# Patient Record
Sex: Female | Born: 1950 | Race: White | Hispanic: No | Marital: Married | Smoking: Never smoker
Health system: Southern US, Community
[De-identification: ages and names within clinical notes are randomized; demographics above are authoritative.]

## PROBLEM LIST (undated history)

## (undated) DIAGNOSIS — I82409 Acute embolism and thrombosis of unspecified deep veins of unspecified lower extremity: Secondary | ICD-10-CM

## (undated) DIAGNOSIS — I509 Heart failure, unspecified: Secondary | ICD-10-CM

## (undated) DIAGNOSIS — R0602 Shortness of breath: Secondary | ICD-10-CM

## (undated) DIAGNOSIS — K219 Gastro-esophageal reflux disease without esophagitis: Secondary | ICD-10-CM

## (undated) DIAGNOSIS — I519 Heart disease, unspecified: Secondary | ICD-10-CM

## (undated) DIAGNOSIS — Z8619 Personal history of other infectious and parasitic diseases: Secondary | ICD-10-CM

## (undated) DIAGNOSIS — I4891 Unspecified atrial fibrillation: Secondary | ICD-10-CM

## (undated) DIAGNOSIS — I447 Left bundle-branch block, unspecified: Secondary | ICD-10-CM

## (undated) DIAGNOSIS — K449 Diaphragmatic hernia without obstruction or gangrene: Secondary | ICD-10-CM

## (undated) DIAGNOSIS — F32A Depression, unspecified: Secondary | ICD-10-CM

## (undated) DIAGNOSIS — E669 Obesity, unspecified: Secondary | ICD-10-CM

## (undated) DIAGNOSIS — E785 Hyperlipidemia, unspecified: Secondary | ICD-10-CM

## (undated) DIAGNOSIS — C50919 Malignant neoplasm of unspecified site of unspecified female breast: Secondary | ICD-10-CM

## (undated) DIAGNOSIS — F329 Major depressive disorder, single episode, unspecified: Secondary | ICD-10-CM

## (undated) DIAGNOSIS — I10 Essential (primary) hypertension: Secondary | ICD-10-CM

## (undated) DIAGNOSIS — Z95810 Presence of automatic (implantable) cardiac defibrillator: Secondary | ICD-10-CM

## (undated) DIAGNOSIS — M1990 Unspecified osteoarthritis, unspecified site: Secondary | ICD-10-CM

## (undated) DIAGNOSIS — I428 Other cardiomyopathies: Secondary | ICD-10-CM

## (undated) DIAGNOSIS — I7100 Dissection of unspecified site of aorta: Secondary | ICD-10-CM

## (undated) HISTORY — PX: BREAST LUMPECTOMY: SHX2

## (undated) HISTORY — PX: TUBAL LIGATION: SHX77

## (undated) HISTORY — DX: Essential (primary) hypertension: I10

## (undated) HISTORY — DX: Malignant neoplasm of unspecified site of unspecified female breast: C50.919

## (undated) HISTORY — DX: Unspecified atrial fibrillation: I48.91

## (undated) HISTORY — DX: Left bundle-branch block, unspecified: I44.7

## (undated) HISTORY — DX: Major depressive disorder, single episode, unspecified: F32.9

## (undated) HISTORY — PX: CARDIAC CATHETERIZATION: SHX172

## (undated) HISTORY — DX: Acute embolism and thrombosis of unspecified deep veins of unspecified lower extremity: I82.409

## (undated) HISTORY — PX: CARDIAC DEFIBRILLATOR PLACEMENT: SHX171

## (undated) HISTORY — DX: Obesity, unspecified: E66.9

## (undated) HISTORY — DX: Hyperlipidemia, unspecified: E78.5

## (undated) HISTORY — DX: Heart disease, unspecified: I51.9

## (undated) HISTORY — PX: TONSILLECTOMY: SUR1361

## (undated) HISTORY — PX: OOPHORECTOMY: SHX86

## (undated) HISTORY — DX: Depression, unspecified: F32.A

## (undated) HISTORY — DX: Presence of automatic (implantable) cardiac defibrillator: Z95.810

## (undated) HISTORY — DX: Dissection of unspecified site of aorta: I71.00

## (undated) HISTORY — DX: Personal history of other infectious and parasitic diseases: Z86.19

## (undated) HISTORY — DX: Other cardiomyopathies: I42.8

---

## 2008-12-01 DIAGNOSIS — I7100 Dissection of unspecified site of aorta: Secondary | ICD-10-CM

## 2008-12-01 HISTORY — DX: Dissection of unspecified site of aorta: I71.00

## 2008-12-02 ENCOUNTER — Ambulatory Visit: Payer: Self-pay | Admitting: Pulmonary Disease

## 2008-12-02 ENCOUNTER — Inpatient Hospital Stay (HOSPITAL_COMMUNITY): Admission: AD | Admit: 2008-12-02 | Discharge: 2008-12-09 | Payer: Self-pay | Admitting: Pulmonary Disease

## 2008-12-02 ENCOUNTER — Ambulatory Visit: Payer: Self-pay | Admitting: Cardiovascular Disease

## 2008-12-02 ENCOUNTER — Ambulatory Visit: Payer: Self-pay | Admitting: Cardiothoracic Surgery

## 2008-12-03 ENCOUNTER — Encounter: Payer: Self-pay | Admitting: Pulmonary Disease

## 2008-12-24 ENCOUNTER — Encounter: Admission: RE | Admit: 2008-12-24 | Discharge: 2008-12-24 | Payer: Self-pay | Admitting: Cardiothoracic Surgery

## 2008-12-24 ENCOUNTER — Ambulatory Visit: Payer: Self-pay | Admitting: Cardiothoracic Surgery

## 2009-02-27 ENCOUNTER — Encounter: Admission: RE | Admit: 2009-02-27 | Discharge: 2009-02-27 | Payer: Self-pay | Admitting: Cardiothoracic Surgery

## 2009-02-27 ENCOUNTER — Ambulatory Visit: Payer: Self-pay | Admitting: Cardiothoracic Surgery

## 2009-07-01 DIAGNOSIS — C50919 Malignant neoplasm of unspecified site of unspecified female breast: Secondary | ICD-10-CM

## 2009-07-01 HISTORY — DX: Malignant neoplasm of unspecified site of unspecified female breast: C50.919

## 2009-08-28 ENCOUNTER — Ambulatory Visit: Payer: Self-pay | Admitting: Cardiothoracic Surgery

## 2009-08-28 ENCOUNTER — Encounter: Admission: RE | Admit: 2009-08-28 | Discharge: 2009-08-28 | Payer: Self-pay | Admitting: Cardiothoracic Surgery

## 2010-03-10 ENCOUNTER — Ambulatory Visit: Payer: Self-pay | Admitting: Cardiothoracic Surgery

## 2010-03-10 ENCOUNTER — Encounter: Admission: RE | Admit: 2010-03-10 | Discharge: 2010-03-10 | Payer: Self-pay | Admitting: Cardiothoracic Surgery

## 2010-08-17 DIAGNOSIS — H409 Unspecified glaucoma: Secondary | ICD-10-CM | POA: Insufficient documentation

## 2010-08-17 DIAGNOSIS — F3289 Other specified depressive episodes: Secondary | ICD-10-CM | POA: Insufficient documentation

## 2010-08-17 DIAGNOSIS — I7100 Dissection of unspecified site of aorta: Secondary | ICD-10-CM

## 2010-08-17 DIAGNOSIS — Z87898 Personal history of other specified conditions: Secondary | ICD-10-CM

## 2010-08-17 DIAGNOSIS — I10 Essential (primary) hypertension: Secondary | ICD-10-CM | POA: Insufficient documentation

## 2010-08-17 DIAGNOSIS — G8929 Other chronic pain: Secondary | ICD-10-CM

## 2010-08-17 DIAGNOSIS — F329 Major depressive disorder, single episode, unspecified: Secondary | ICD-10-CM

## 2010-08-18 ENCOUNTER — Ambulatory Visit: Payer: Self-pay | Admitting: Cardiovascular Disease

## 2010-08-18 DIAGNOSIS — I447 Left bundle-branch block, unspecified: Secondary | ICD-10-CM | POA: Insufficient documentation

## 2010-10-04 ENCOUNTER — Inpatient Hospital Stay (HOSPITAL_COMMUNITY)
Admission: EM | Admit: 2010-10-04 | Discharge: 2010-10-08 | Payer: Self-pay | Source: Home / Self Care | Attending: Internal Medicine | Admitting: Internal Medicine

## 2010-10-05 ENCOUNTER — Encounter: Payer: Self-pay | Admitting: Internal Medicine

## 2010-10-12 ENCOUNTER — Telehealth: Payer: Self-pay | Admitting: Cardiovascular Disease

## 2010-10-18 DIAGNOSIS — I82409 Acute embolism and thrombosis of unspecified deep veins of unspecified lower extremity: Secondary | ICD-10-CM

## 2010-10-18 HISTORY — DX: Acute embolism and thrombosis of unspecified deep veins of unspecified lower extremity: I82.409

## 2010-10-19 ENCOUNTER — Ambulatory Visit: Payer: Self-pay | Admitting: Cardiovascular Disease

## 2010-10-19 DIAGNOSIS — I82409 Acute embolism and thrombosis of unspecified deep veins of unspecified lower extremity: Secondary | ICD-10-CM | POA: Insufficient documentation

## 2010-10-19 DIAGNOSIS — R062 Wheezing: Secondary | ICD-10-CM

## 2010-10-19 DIAGNOSIS — I429 Cardiomyopathy, unspecified: Secondary | ICD-10-CM

## 2010-10-28 ENCOUNTER — Ambulatory Visit: Payer: Self-pay | Admitting: Pulmonary Disease

## 2010-10-28 DIAGNOSIS — G4733 Obstructive sleep apnea (adult) (pediatric): Secondary | ICD-10-CM | POA: Insufficient documentation

## 2010-11-02 ENCOUNTER — Encounter: Payer: Self-pay | Admitting: Cardiovascular Disease

## 2010-11-10 ENCOUNTER — Telehealth: Payer: Self-pay | Admitting: Cardiovascular Disease

## 2010-11-10 ENCOUNTER — Encounter: Payer: Self-pay | Admitting: Pulmonary Disease

## 2010-11-10 ENCOUNTER — Ambulatory Visit (HOSPITAL_BASED_OUTPATIENT_CLINIC_OR_DEPARTMENT_OTHER)
Admission: RE | Admit: 2010-11-10 | Discharge: 2010-11-10 | Payer: Self-pay | Source: Home / Self Care | Attending: Pulmonary Disease | Admitting: Pulmonary Disease

## 2010-11-24 ENCOUNTER — Telehealth (INDEPENDENT_AMBULATORY_CARE_PROVIDER_SITE_OTHER): Payer: Self-pay | Admitting: *Deleted

## 2010-11-26 ENCOUNTER — Ambulatory Visit
Admission: RE | Admit: 2010-11-26 | Discharge: 2010-11-26 | Payer: Self-pay | Source: Home / Self Care | Attending: Pulmonary Disease | Admitting: Pulmonary Disease

## 2010-11-30 NOTE — Assessment & Plan Note (Signed)
Summary: pt wants cardiac eval/mt   Visit Type:  new pt visit Primary Provider:  [name]  CC:  establish cardiology care. 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 She tells me that she has been feeling well. I saw her as a consult at [facility] in February 2010 when she was admitted with abdominal pain. She was found to have LBBB and a Type B aortic dissection which was managed conservatively at the time. She has had no other cardiac issues. Her aortic dissection has healed. Last CT October 2010 and last visit with Dr. Morton Peters in May 2011.   She describes mild dypnea with exertion which is unchanged. She has had no chest pain. She denies near syncope, syncope, LE edema or PND.   She thinks that she may have had an echo done at [facility] this spring. Her echo at [facility] in February  2010 with normal LV function, no valvular abnormalities.   Current Medications (verified): 1)  Paxil 10 Mg Tabs (Paroxetine Hcl) .[name].. 1 Tab Once Daily 2)  Diovan Hct 320-25 Mg Tabs (Valsartan-Hydrochlorothiazide) .[name].. 1 Tab Once Daily 3)  Metoprolol Succinate 100 Mg Xr24h-Tab (Metoprolol Succinate) .... 1/2 Tab Qam...1 Tab At Bedtime 4)  Calcium-Vitamin D 600-200 Mg-Unit Tabs (Calcium-Vitamin D) .[name].. 1 Tab Two Times A Day 5)  Multivitamins   Tabs (Multiple Vitamin) .[name].. 1 Tab Once Daily 6)  Letrozole 2.5 Mg Tabs (Letrozole) .[name].. 1 Tab Qpm 7)  Omeprazole 20 Mg Cpdr (Omeprazole) .[name].. 1 Tab Two Times A Day 8)  Lumigan 0.03 % Soln (Bimatoprost) .[name].. 1 Gtt Ou At Bedtime 9)  Aspirin 81 Mg Tbec (Aspirin) .... Take One Tablet By Mouth Daily 10)  Ranitidine Hcl 300 Mg Caps (Ranitidine Hcl) .[name].. 1 Cap At Bedtime 11)  Niaspan 1000 Mg Cr-Tabs (Niacin (Antihyperlipidemic)) .[name].. 1 Tab At Bedtime 12)  Simvastatin 20 Mg Tabs (Simvastatin) .[name].. 1 Tab At Bedtime  Allergies  (verified): No Known Drug Allergies  Past History:  Past Medical History: OBESITY (ICD-278.00) Type B AORTIC DISSECTION February 2010- HYPERTENSION (ICD-401.9) GLAUCOMA (ICD-365.9) DEPRESSION (ICD-311) Breast cancer September 2010-Stage 3 lumpectomy, radiation, chemotherapy-finished chemo October SHINGLES, HX OF (ICD-V13.8) Hyperlipidemia  Past Surgical History: Bilateral tubal ligation Single ovary removal Right breast lumpectomy Tonsillectomy  Family History: Mother-alive, HTN Father-deceased, cerebral aneurysm 2 brothers-alive with HTN  Both grandfathers died of CAD/MI  Social History: The patient is married, lives in [place], and is   retired from [employer]. Now on [employer].   She has one child.  She does not smoke or use alcohol.  No illicit drug use     Review of Systems  The patient denies fatigue, malaise, fever, weight gain/loss, vision loss, decreased hearing, hoarseness, chest pain, palpitations, shortness of breath, prolonged cough, wheezing, sleep apnea, coughing up blood, abdominal pain, blood in stool, nausea, vomiting, diarrhea, heartburn, incontinence, blood in urine, muscle weakness, joint pain, leg swelling, rash, skin lesions, headache, fainting, dizziness, depression, anxiety, enlarged lymph nodes, easy bruising or bleeding, and environmental allergies.    Vital Signs:  Patient profile:   60 year old female Height:      62.5 inches Weight:      249 pounds BMI:     44.98 Pulse rate:   64 / minute Pulse rhythm:   irregular BP sitting:   126 / 74  (left arm) Cuff  size:   large  Vitals Entered By: Danielle Rankin, CMA (August 18, 2010 8:40 AM)  Physical Exam  General:  General: Well developed, well nourished, NAD HEENT: OP clear, mucus membranes moist SKIN: warm, dry Neuro: No focal deficits Musculoskeletal: Muscle strength 5/5 all ext Psychiatric: Mood and affect normal Neck: No JVD, no carotid bruits, no thyromegaly, no  lymphadenopathy. Lungs:Clear bilaterally, no wheezes, rhonci, crackles CV: RRR no murmurs, gallops rubs Abdomen: soft, NT, ND, BS present Extremities: No edema, pulses 2+.    EKG  Procedure date:  08/18/2010  Findings:      NSR, rate 64 bpm. LBBB  Impression & Recommendations:  Problem # 1:  LBBB (ICD-426.3) Chronic. Will get recent echo report from [facility]. No other cardiac workup indicated at this time.   Her updated medication list for this problem includes:    Metoprolol Succinate 100 Mg Xr24h-tab (Metoprolol succinate) .[name]... 1/2 tab qam...1 tab at bedtime    Aspirin 81 Mg Tbec (Aspirin) .[name]... Take one tablet by mouth daily  Problem # 2:  HYPERTENSION (ICD-401.9) BP well controlled on current therapy. No changes.   Her updated medication list for this problem includes:    Diovan Hct 320-25 Mg Tabs (Valsartan-hydrochlorothiazide) .[name]... 1 tab once daily    Metoprolol Succinate 100 Mg Xr24h-tab (Metoprolol succinate) .[name]... 1/2 tab qam...1 tab at bedtime    Aspirin 81 Mg Tbec (Aspirin) .[name]... Take one tablet by mouth daily  Other Orders: EKG w/ Interpretation (93000)  Patient Instructions: 1)  Your physician recommends that you schedule a follow-up appointment in 1 year. 2)  Your physician recommends that you continue on your current medications as directed. Please refer to the Current Medication list given to you today.

## 2010-12-02 NOTE — Assessment & Plan Note (Signed)
Summary: consult for possible osa   Copy to:  Verne Carrow Primary Provider/Referring Provider:  Feliciana Rossetti  CC:  Sleep Consult.  History of Present Illness: The pt is a 60y/o female who I have been asked to see for possible osa.  She has been noted to have loud snoring, as well as an abnormal breathing pattern during sleep.  She was also noted to have oxygen desaturations at night during her last hospitalization.  She goes to bed at 10pm, and arises at 8am to start her day.  She currently feels rested most mornings, however notes that when she had CHF issues she had restless and nonrestorative sleep.  She currently denies inappropriate daytime sleepiness.  She denies having significant sleepiness with watching tv or movies in the evening as long as she is sitting upright.  She has no sleepiness issues with driving.  Her epworth score is only 4 today.  She tells me her weight is down 20 pounds over the last 2 years.  Current Medications (verified): 1)  Paxil 10 Mg Tabs (Paroxetine Hcl) .[name].. 1 Tab Once Daily 2)  Diovan 160 Mg Tabs (Valsartan) .... Take One Tablet By Mouth Daily 3)  Calcium-Vitamin D 600-200 Mg-Unit Tabs (Calcium-Vitamin D) .[name].. 1 Tab Two Times A Day 4)  Multivitamins   Tabs (Multiple Vitamin) .[name].. 1 Tab Once Daily 5)  Letrozole 2.5 Mg Tabs (Letrozole) .[name].. 1 Tab Qpm 6)  Omeprazole 20 Mg Cpdr (Omeprazole) .[name].. 1 Tab Two Times A Day 7)  Lumigan 0.03 % Soln (Bimatoprost) .[name].. 1 Gtt Ou At Bedtime 8)  Aspirin 81 Mg Tbec (Aspirin) .... Take One Tablet By Mouth Daily 9)  Ranitidine Hcl 300 Mg Caps (Ranitidine Hcl) .[name].. 1 Cap At Bedtime 10)  Niaspan 1000 Mg Cr-Tabs (Niacin (Antihyperlipidemic)) .[name].. 1 Tab At Bedtime 11)  Simvastatin 20 Mg Tabs (Simvastatin) .[name].. 1 Tab At Bedtime 12)  Furosemide 40 Mg Tabs (Furosemide) .[name].. 1 By Mouth Daily 13)  Coreg 3.125 Mg Tabs (Carvedilol) .[name].. 1 By Mouth Two Times A Day 14)  Klor-Con M20 20 Meq Cr-Tabs (Potassium Chloride Crys Cr) .[name]..  1 By Mouth Daily 15)  Singulair 10 Mg Tabs (Montelukast Sodium) .... As Needed 16)  Allegra Allergy 180 Mg Tabs (Fexofenadine Hcl) .... As Needed 17)  Flonase 50 Mcg/act Susp (Fluticasone Propionate) .... As Needed 18)  Alprazolam 1 Mg Tabs (Alprazolam) .... As Needed 19)  Bepreve 1.5 % Soln (Bepotastine Besilate) .... As Needed 20)  Warfarin Sodium 5 Mg Tabs (Warfarin Sodium) .... Take As Directed  Allergies (verified): No Known Drug Allergies  Past History:  Past Medical History: Reviewed history from 10/19/2010 and no changes required. 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 DVT diagnosed on 10/18/10. OBESITY (ICD-278.00) Type B AORTIC DISSECTION February 2010- HYPERTENSION (ICD-401.9) GLAUCOMA (ICD-365.9) DEPRESSION (ICD-311) Breast cancer September 2010-Stage 3 lumpectomy, radiation, chemotherapy-finished chemo October SHINGLES, HX OF (ICD-V13.8) Hyperlipidemia  Past Surgical History: Reviewed history from 08/18/2010 and no changes required. Bilateral tubal ligation Single ovary removal Right breast lumpectomy Tonsillectomy  Family History: Reviewed history from 08/18/2010 and no changes required. Mother-alive, HTN Father-deceased, cerebral aneurysm, also had HTN 2 brothers-alive with HTN  Both grandfathers died of CAD/MI, also had HTN  Social History: Reviewed history from 08/18/2010 and no changes required. The patient is married, lives in [place], and is   retired from [employer].  Now on [employer].   She has one child. Never smoked or use alcohol.  No illicit drug use     Review of Systems       The patient complains of shortness of breath with activity, non-productive cough, indigestion, and hand/feet swelling.  The patient denies shortness of breath at rest, productive cough, coughing up blood, chest pain, irregular  heartbeats, acid heartburn, loss of appetite, weight change, abdominal pain, difficulty swallowing, sore throat, tooth/dental problems, headaches, nasal congestion/difficulty breathing through nose, sneezing, itching, ear ache, anxiety, depression, joint stiffness or pain, rash, change in color of mucus, and fever.    Vital Signs:  Patient profile:   60 year old female Height:      63 inches Weight:      244 pounds BMI:     43.38 O2 Sat:      98 % on Room air Temp:     97.4 degrees F oral Pulse rate:   84 / minute BP sitting:   92 / 60  (left arm) Cuff size:   large  Vitals Entered By: Arman Filter LPN (October 28, 2010 10:23 AM)  O2 Flow:  Room air CC: Sleep Consult Comments Medications reviewed with patient Arman Filter LPN  October 28, 2010 10:23 AM    Physical Exam  General:  obese female in nad Eyes:  PERRLA and EOMI.   Nose:  deviated septum to left with narrowing. Mouth:  small oropharynx posteriorly, mild elongation of soft palate with normal uvula. Neck:  enlarged, no jvd, tmg, LN Lungs:  clear to auscultation Heart:  rrr, no mrg Abdomen:  soft and nontender, bs+ Extremities:  1+ edema, varicosities present, no cyanosis  pulses intact distally Neurologic:  alert and oriented, moves all 4.   Impression & Recommendations:  Problem # 1:  OBSTRUCTIVE SLEEP APNEA (ICD-327.23) there are many aspects of the pt's history that are very suggestive of sleep apnea, although she feels she is much improved since getting over her CHF.  It is unclear whether her prior symptoms could be due to central apneas related to her CM?  Given her history and co-morbid medical problems, I think she needs to have a sleep study to put this issue to rest.  She is agreeable to this, and will followup once results are available.  Other Orders: Consultation Level IV (11914) Sleep Disorder Referral (Sleep Disorder)  Patient Instructions: 1)  will schedule for sleep study, and will arrange  for followup once results are available. 2)  work on weight reduction

## 2010-12-02 NOTE — Progress Notes (Signed)
Summary: change in meds  Phone Note From Pharmacy   Caller: [facility] Reason for Call: Patient requests substitution Summary of Call: pt having trouble taking KLOR-CON M20 20 MEQ. pharmacist kristen # [phone] wants to know if they can change to micro-k. Initial call taken by: Roe Coombs,  November 10, 2010 11:43 AM  Follow-up for Phone Call        pt having trouble swalling pill, gave ok to change to micro-k 2 tabs daily Meredith Staggers, RN  November 10, 2010 11:51 AM     New/Updated Medications: MICRO-K 10 MEQ CR-CAPS (POTASSIUM CHLORIDE) 2 tabs daily

## 2010-12-02 NOTE — Assessment & Plan Note (Signed)
Summary: EPH/JML   Visit Type:  Follow-up Primary Provider:  Feliciana Rossetti  CC:  Left leg pain.  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  I saw her as a consult at [facility] in February 2010 when she was admitted with abdominal pain. She was found to have LBBB and a Type B aortic dissection which was managed conservatively at the time. She had no other cardiac issues. Her aortic dissection has healed. Last CT October 2010 and last visit with Dr. Morton Peters in May 2011. Since that visit, she was admitted to [facility] on December 5th, 2011 with volume overload and pulmonary edema. Echo showed EF of 25%. Cardiac cath on 10/06/10 with no evidence of coronary artery disease. It was felt that this may be related to her chemotherapy.   She is here today for follow up. She tells me that she was seen in the [facility] ED last night and was found to have a DVT in the left leg. She was started on Lovenox in the ED and told to follow up with me. She was not started on coumadin.  Her left leg has not been swollen but has been painful. She has been tolerating her meds. No chest pain. Breathing has been ok. She has not started physical therapy. Her weight is down by 4 pounds on her home scale over the last week. She is taking Lasix 40 mg daily and KCl supplements.     Current Medications (verified): 1)  Paxil 10 Mg Tabs (Paroxetine Hcl) .[name].. 1 Tab Once Daily 2)  Diovan 160 Mg Tabs (Valsartan) .... Take One Tablet By Mouth Daily 3)  Calcium-Vitamin D 600-200 Mg-Unit Tabs (Calcium-Vitamin D) .[name].. 1 Tab Two Times A Day 4)  Multivitamins   Tabs (Multiple Vitamin) .[name].. 1 Tab Once Daily 5)  Letrozole 2.5 Mg Tabs (Letrozole) .[name].. 1 Tab Qpm 6)  Omeprazole 20 Mg Cpdr (Omeprazole) .[name].. 1 Tab Two Times A Day 7)  Lumigan 0.03 % Soln (Bimatoprost) .[name].. 1 Gtt Ou At  Bedtime 8)  Aspirin 81 Mg Tbec (Aspirin) .... Take One Tablet By Mouth Daily 9)  Ranitidine Hcl 300 Mg Caps (Ranitidine Hcl) .[name].. 1 Cap At Bedtime 10)  Niaspan 1000 Mg Cr-Tabs (Niacin (Antihyperlipidemic)) .[name].. 1 Tab At Bedtime 11)  Simvastatin 20 Mg Tabs (Simvastatin) .[name].. 1 Tab At Bedtime 12)  Furosemide 40 Mg Tabs (Furosemide) .[name].. 1 By Mouth Daily 13)  Coreg 3.125 Mg Tabs (Carvedilol) .[name].. 1 By Mouth Two Times A Day 14)  Lovenox 80 Mg/0.51ml Soln (Enoxaparin Sodium) .... Uad 15)  Klor-Con M20 20 Meq Cr-Tabs (Potassium Chloride Crys Cr) .[name].. 1 By Mouth Daily 16)  Singulair 10 Mg Tabs (Montelukast Sodium) .... As Needed 17)  Allegra Allergy 180 Mg Tabs (Fexofenadine Hcl) .... As Needed 18)  Flonase 50 Mcg/act Susp (Fluticasone Propionate) .... As Needed 19)  Alprazolam 1 Mg Tabs (Alprazolam) .... As Needed 20)  Bepreve 1.5 % Soln (Bepotastine Besilate) .... 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 DVT diagnosed on 10/18/10. OBESITY (ICD-278.00) Type B AORTIC DISSECTION February 2010- HYPERTENSION (ICD-401.9) GLAUCOMA (ICD-365.9) DEPRESSION (ICD-311) Breast cancer  September 2010-Stage 3 lumpectomy, radiation, chemotherapy-finished chemo October SHINGLES, HX OF (ICD-V13.8) Hyperlipidemia  Social History: Reviewed history from 08/18/2010 and no changes required. The patient is married, lives in [place], and is   retired from [employer]. Now on [employer].   She has one child.  She does not smoke or use alcohol.  No illicit drug use     Review of Systems  The patient denies fatigue, malaise, fever, weight gain/loss, vision loss, decreased hearing, hoarseness, chest pain, palpitations, shortness of breath, prolonged cough, wheezing, sleep apnea, coughing up  blood, abdominal pain, blood in stool, nausea, vomiting, diarrhea, heartburn, incontinence, blood in urine, muscle weakness, joint pain, leg swelling, rash, skin lesions, headache, fainting, dizziness, depression, anxiety, enlarged lymph nodes, easy bruising or bleeding, and environmental allergies.         left leg pain  Vital Signs:  Patient profile:   60 year old female Height:      62.5 inches Weight:      243 pounds BMI:     43.90 Pulse rate:   66 / minute Resp:     16 per minute BP sitting:   130 / 93  (left arm)  Vitals Entered By: Marrion Coy, CNA (October 19, 2010 3:34 PM)  Physical Exam  General:  General: Well developed, well nourished, NAD Musculoskeletal: Muscle strength 5/5 all ext Psychiatric: Mood and affect normal Neck: No JVD, no carotid bruits, no thyromegaly, no lymphadenopathy. Lungs:Clear bilaterally, no wheezes, rhonci, crackles CV: RRR no murmurs, gallops rubs Abdomen: soft, NT, ND, BS present Extremities: No edema, pulses 2+.    Cardiac Cath  Procedure date:  10/06/2010  Findings:      HEMODYNAMIC FINDINGS:  Right atrial pressure 7, right ventricular pressure 33/6, right ventricular end-diastolic pressure 13, pulmonary artery pressure 25/60, mean pulmonary artery pressure 20, pulmonary capillary wedge pressure mean of 15, left ventricular pressure 103/10, central aortic pressure 102/65.  Cardiac output 5.65 liters per minute. Cardiac index 2.67 liters per minute per meter squared.  Central aortic saturation 96%.  Pulmonary artery saturation 68%.   ANGIOGRAPHIC FINDINGS: 1. The left main coronary artery had no evidence of obstructive     disease.  The left anterior descending is a large vessel that     coursed to the apex and gave off 2 moderate-sized diagonal     branches.  There was no evidence of disease in this system. 2. Circumflex artery gave off 2 large caliber obtuse marginal     branches.  There was no evidence of obstructive disease  in this     system. 3. The right coronary artery is a large dominant vessel with no     evidence of obstructive disease. 4. Left ventricular angiogram was performed in the RAO projection and     showed global left ventricular systolic dysfunction with ejection     fraction of 20-25%.  Echocardiogram  Procedure date:  10/05/2010  Findings:      Left ventricle: The left ventricle is poorly visualized but appears   to be enlarged with global hypokinesis with EF estimated at 25-30%.   Poorly visualized. Regional wall motion abnormalities cannot be   excluded. Features are consistent with a pseudonormal left   ventricular filling pattern, with concomitant abnormal relaxation   and increased filling pressure (grade 2 diastolic dysfunction).  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 Continue Coreg, Diovan, ASA, Lasix and KCl.  She will follow daily weights and take extra Lasix and KCl if her weight trends up.    The following medications were removed from the medication list:    Metoprolol Succinate 100 Mg Xr24h-tab (Metoprolol succinate) .[name]... 1/2 tab qam...1 tab at bedtime Her updated medication list for this problem includes:    Diovan 160 Mg Tabs (Valsartan) .[name]... Take one tablet by mouth daily    Aspirin 81 Mg Tbec (Aspirin) .[name]... Take one tablet by mouth daily    Furosemide 40 Mg Tabs (Furosemide) .[name]... 1 by mouth daily    Coreg 3.125 Mg Tabs (Carvedilol) .[name]... 1 by mouth two times a day    Lovenox 80 Mg/0.18ml Soln (Enoxaparin sodium) ..... Uad  The following medications were removed from the medication list:    Metoprolol Succinate 100 Mg Xr24h-tab (Metoprolol succinate) .[name]... 1/2 tab qam...1 tab at bedtime Her updated medication list for this problem includes:    Diovan 160 Mg Tabs (Valsartan) .[name]... Take one tablet by mouth daily    Aspirin 81 Mg Tbec (Aspirin)  .[name]... Take one tablet by mouth daily    Furosemide 40 Mg Tabs (Furosemide) .[name]... 1 by mouth daily    Coreg 3.125 Mg Tabs (Carvedilol) .[name]... 1 by mouth two times a day    Lovenox 80 Mg/0.37ml Soln (Enoxaparin sodium) ..... Uad    Warfarin Sodium 5 Mg Tabs (Warfarin sodium) .[name]... Take as directed  Problem # 2:  DVT (ICD-453.40) Will start coumadin 5 mg by mouth Qdaily. We will have her INR followed in Dr. Anders Grant office in [place]. We have spoken to their office today and they are willing to check her INR on Friday. She will need at least 6 months of coumadin with repeat venous doppler in 6 months. Overlap with Lovenox until INR therapeutic.   Patient Instructions: 1)  Start Coumadin 5mg  daily 2)  Follow up with Dr Shary Decamp for management of your coumadin and lovenox. 3)  Your physician wants you to follow-up in: 6 months.  You will receive a reminder letter in the mail two months in advance. If you don't receive a letter, please call our office to schedule the follow-up appointment. Prescriptions: WARFARIN SODIUM 5 MG TABS (WARFARIN SODIUM) take as directed  #30 x 0   Entered by:   Meredith Staggers, RN   Authorized by:   Verne Carrow, MD   Signed by:   Meredith Staggers, RN on 10/19/2010   Method used:   Electronically to        [facility] (retail)       [address]       [place], [place]  [address]       Ph: [phone] or [phone]       Fax: [phone]   RxID:   [id number]

## 2010-12-02 NOTE — Letter (Signed)
Summary: [facility] - Consult Note  [facility] - Consult Note   Imported By: Marylou Mccoy 11/22/2010 12:23:45  _____________________________________________________________________  External Attachment:    Type:   Image     Comment:   External Document

## 2010-12-02 NOTE — Progress Notes (Signed)
Summary: question on meds   Phone Note Call from Patient Call back at Home Phone [phone]   Caller: Patient Reason for Call: Talk to Nurse Summary of Call: pt has question on med Lisinopril 5 mg. pt states meds are making her cough. Initial call taken by: Roe Coombs,  October 12, 2010 3:07 PM  Follow-up for Phone Call        Pt. C/O a cough since she was started on Lisinopril 5mg . Per Dr. Clifton James we will start on her Diovan 160mg  by mouth daily. She will keep a record of her BP and will f/u with [facility] 12/20. Whitney Maeola Sarah RN  October 12, 2010 5:46 PM  Follow-up by: Whitney Maeola Sarah RN,  October 12, 2010 5:46 PM    New/Updated Medications: DIOVAN 160 MG TABS (VALSARTAN) Take one tablet by mouth daily Prescriptions: DIOVAN 160 MG TABS (VALSARTAN) Take one tablet by mouth daily  #30 x 3   Entered by:   Whitney Maeola Sarah RN   Authorized by:   Verne Carrow, MD   Signed by:   Ellender Hose RN on 10/12/2010   Method used:   Electronically to        [facility] (retail)       [address]       [place], [place]  [address]       Ph: [phone] or [phone]       Fax: [phone]   RxID:   [id number]

## 2010-12-02 NOTE — Progress Notes (Signed)
Summary: needs ov with kc   Phone Note Outgoing Call   Call placed by: Arman Filter LPN,  November 24, 2010 2:06 PM Call placed to: Patient Summary of Call: pt needs ov with kc to discuss sleep study results.  LM with pt's aunt for pt to call back to schedule appt. I have held some slots in [facility] schedule that can be used for this pt.  Aundra Millet Jevonte Clanton LPN  November 24, 2010 2:06 PM  Initial call taken by: Arman Filter LPN,  November 24, 2010 2:06 PM  Follow-up for Phone Call        pt called back.  pt scheduled to see [facility] 11/26/2010 at 3:45pm.  Arman Filter LPN  November 24, 2010 5:05 PM

## 2010-12-16 NOTE — Assessment & Plan Note (Signed)
Summary: ov to discuss sleep study results/mg   Copy to:  Verne Carrow Primary Provider/Referring Provider:  Feliciana Rossetti  CC:  Ov to discuss sleep study results. .  History of Present Illness: the pt comes in today for f/u of her recent sleep study.  She was found to have minimal OSA, with AHI of only 6/hr.  She had desaturation as low as 85%, but only spent 24 seconds during the night less than 88%.  I have reviewed the study with her in detail, and answered all of her questions.    Medications Prior to Update: 1)  Paxil 10 Mg Tabs (Paroxetine Hcl) .[name].. 1 Tab Once Daily 2)  Diovan 160 Mg Tabs (Valsartan) .... Take One Tablet By Mouth Daily 3)  Calcium-Vitamin D 600-200 Mg-Unit Tabs (Calcium-Vitamin D) .[name].. 1 Tab Two Times A Day 4)  Multivitamins   Tabs (Multiple Vitamin) .[name].. 1 Tab Once Daily 5)  Letrozole 2.5 Mg Tabs (Letrozole) .[name].. 1 Tab Qpm 6)  Omeprazole 20 Mg Cpdr (Omeprazole) .[name].. 1 Tab Two Times A Day 7)  Lumigan 0.03 % Soln (Bimatoprost) .[name].. 1 Gtt Ou At Bedtime 8)  Aspirin 81 Mg Tbec (Aspirin) .... Take One Tablet By Mouth Daily 9)  Ranitidine Hcl 300 Mg Caps (Ranitidine Hcl) .[name].. 1 Cap At Bedtime 10)  Niaspan 1000 Mg Cr-Tabs (Niacin (Antihyperlipidemic)) .[name].. 1 Tab At Bedtime 11)  Simvastatin 20 Mg Tabs (Simvastatin) .[name].. 1 Tab At Bedtime 12)  Furosemide 40 Mg Tabs (Furosemide) .[name].. 1 By Mouth Daily 13)  Coreg 3.125 Mg Tabs (Carvedilol) .[name].. 1 By Mouth Two Times A Day 14)  Micro-K 10 Meq Cr-Caps (Potassium Chloride) .... 2 Tabs Daily 15)  Singulair 10 Mg Tabs (Montelukast Sodium) .... As Needed 16)  Allegra Allergy 180 Mg Tabs (Fexofenadine Hcl) .... As Needed 17)  Flonase 50 Mcg/act Susp (Fluticasone Propionate) .... As Needed 18)  Alprazolam 1 Mg Tabs (Alprazolam) .... As Needed 19)  Bepreve 1.5 % Soln (Bepotastine Besilate) .... As Needed 20)  Warfarin Sodium 5 Mg Tabs (Warfarin Sodium) .... Take As Directed  Allergies (verified): No Known Drug  Allergies  Review of Systems       The patient complains of non-productive cough and sneezing.  The patient denies shortness of breath with activity, shortness of breath at rest, productive cough, coughing up blood, chest pain, irregular heartbeats, acid heartburn, indigestion, loss of appetite, weight change, abdominal pain, difficulty swallowing, sore throat, tooth/dental problems, headaches, nasal congestion/difficulty breathing through nose, itching, ear ache, anxiety, depression, hand/feet swelling, joint stiffness or pain, rash, change in color of mucus, and fever.    Vital Signs:  Patient profile:   60 year old female Height:      63 inches Weight:      245.13 pounds BMI:     43.58 O2 Sat:      97 % on Room air Temp:     97.5 degrees F oral Pulse rate:   84 / minute BP sitting:   114 / 78  (left arm) Cuff size:   large  Vitals Entered By: Arman Filter LPN (November 26, 2010 3:22 PM)  O2 Flow:  Room air CC: Ov to discuss sleep study results.  Comments Medications reviewed with patient Arman Filter LPN  November 26, 2010 3:27 PM    Physical Exam  General:  obese female in nad Extremities:  edema noted, no cyanosis +varicosities Neurologic:  alert, oriented, moves all 4.   Impression & Recommendations:  Problem # 1:  OBSTRUCTIVE SLEEP APNEA (ICD-327.23) the pt has very mild osa by her recent sleep study, and really is not overly symptomatic.  This degree of sleep apnea  does not represent a significant CV risk.  I suspect a lot of the issues she had prior were due to her CHF with possible Garret Reddish.  At this point, I have asked the pt to continue with weight loss, and to keep her fluid balance under control.  If she should become more symptomatic at some point with an impact on her QOL, could consider treating her more aggressively.  Other Orders: Est. Patient Level III (16109)  Patient Instructions: 1)  work on weight loss as you are doing 2)  if you begin to  have issues with your sleep, or if you feel your alertness during the day is worsening, let us know.   Immunization History:  Influenza Immunization History:    Influenza:  historical (08/31/2010)  Pneumovax Immunization History:    Pneumovax:  historical (09/30/2010)

## 2011-01-11 LAB — BASIC METABOLIC PANEL
BUN: 15 mg/dL (ref 6–23)
BUN: 17 mg/dL (ref 6–23)
CO2: 34 mEq/L — ABNORMAL HIGH (ref 19–32)
CO2: 34 mEq/L — ABNORMAL HIGH (ref 19–32)
CO2: 36 mEq/L — ABNORMAL HIGH (ref 19–32)
CO2: 36 mEq/L — ABNORMAL HIGH (ref 19–32)
Calcium: 8.8 mg/dL (ref 8.4–10.5)
Calcium: 9 mg/dL (ref 8.4–10.5)
Chloride: 95 mEq/L — ABNORMAL LOW (ref 96–112)
Chloride: 98 mEq/L (ref 96–112)
Creatinine, Ser: 1.08 mg/dL (ref 0.4–1.2)
Creatinine, Ser: 1.14 mg/dL (ref 0.4–1.2)
Creatinine, Ser: 1.23 mg/dL — ABNORMAL HIGH (ref 0.4–1.2)
GFR calc Af Amer: >60 mL/min (ref >60)
Glucose, Bld: 104 mg/dL — ABNORMAL HIGH (ref 70–99)
Glucose, Bld: 119 mg/dL — ABNORMAL HIGH (ref 70–99)
Glucose, Bld: 95 mg/dL (ref 70–99)
Potassium: 3 mEq/L — ABNORMAL LOW (ref 3.5–5.1)
Potassium: 3.7 mEq/L (ref 3.5–5.1)
Sodium: 142 mEq/L (ref 135–145)

## 2011-01-11 LAB — POCT I-STAT 3, ART BLOOD GAS (G3+)
Bicarbonate: 35.7 mEq/L — ABNORMAL HIGH (ref 20.0–24.0)
O2 Saturation: 96 %
TCO2: 37 mmol/L (ref 0–100)
pCO2 arterial: 53.9 mmHg — ABNORMAL HIGH (ref 35.0–45.0)
pO2, Arterial: 81 mmHg (ref 80.0–100.0)

## 2011-01-11 LAB — MAGNESIUM: Magnesium: 2.1 mg/dL (ref 1.5–2.5)

## 2011-01-11 LAB — COMPREHENSIVE METABOLIC PANEL
AST: 53 U/L — ABNORMAL HIGH (ref 0–37)
BUN: 13 mg/dL (ref 6–23)
CO2: 34 mEq/L — ABNORMAL HIGH (ref 19–32)
Calcium: 9.2 mg/dL (ref 8.4–10.5)
Creatinine, Ser: 0.85 mg/dL (ref 0.4–1.2)
GFR calc Af Amer: >60 mL/min (ref >60)
GFR calc non Af Amer: >60 mL/min (ref >60)
Total Bilirubin: 1.6 mg/dL — ABNORMAL HIGH (ref 0.3–1.2)

## 2011-01-11 LAB — POCT I-STAT 3, VENOUS BLOOD GAS (G3P V)
Bicarbonate: 32.1 mEq/L — ABNORMAL HIGH (ref 20.0–24.0)
pH, Ven: 7.424 — ABNORMAL HIGH (ref 7.250–7.300)
pO2, Ven: 35 mmHg (ref 30.0–45.0)

## 2011-01-11 LAB — CBC
HCT: 39.7 % (ref 36.0–46.0)
Hemoglobin: 12.4 g/dL (ref 12.0–15.0)
Hemoglobin: 13.1 g/dL (ref 12.0–15.0)
MCH: 30.6 pg (ref 26.0–34.0)
MCHC: 32.5 g/dL (ref 30.0–36.0)
MCHC: 33 g/dL (ref 30.0–36.0)
MCV: 92.3 fL (ref 78.0–100.0)
Platelets: 120 10*3/uL — ABNORMAL LOW (ref 150–400)
RDW: 14.6 % (ref 11.5–15.5)

## 2011-01-11 LAB — CARDIAC PANEL(CRET KIN+CKTOT+MB+TROPI)
CK, MB: 0.7 ng/mL (ref 0.3–4.0)
CK, MB: 0.8 ng/mL (ref 0.3–4.0)
Relative Index: INVALID (ref 0.0–2.5)
Relative Index: INVALID (ref 0.0–2.5)
Troponin I: 0.02 ng/mL (ref 0.00–0.06)

## 2011-01-11 LAB — LIPID PANEL
Cholesterol: 190 mg/dL (ref 0–200)
HDL: 39 mg/dL — ABNORMAL LOW (ref >39)
LDL Cholesterol: 133 mg/dL — ABNORMAL HIGH (ref 0–99)
Triglycerides: 90 mg/dL (ref <150)

## 2011-02-15 LAB — CARDIAC PANEL(CRET KIN+CKTOT+MB+TROPI)
CK, MB: 0.5 ng/mL (ref 0.3–4.0)
CK, MB: 0.5 ng/mL (ref 0.3–4.0)
CK, MB: 0.8 ng/mL (ref 0.3–4.0)
CK, MB: 1.1 ng/mL (ref 0.3–4.0)
Relative Index: INVALID (ref 0.0–2.5)
Relative Index: INVALID (ref 0.0–2.5)
Relative Index: INVALID (ref 0.0–2.5)
Relative Index: INVALID (ref 0.0–2.5)
Total CK: 20 U/L (ref 7–177)
Total CK: 30 U/L (ref 7–177)
Total CK: 48 U/L (ref 7–177)
Troponin I: <0.01 ng/mL (ref 0.00–0.06)
Troponin I: <0.01 ng/mL (ref 0.00–0.06)
Troponin I: <0.01 ng/mL (ref 0.00–0.06)

## 2011-02-15 LAB — GLUCOSE, CAPILLARY
Glucose-Capillary: 108 mg/dL — ABNORMAL HIGH (ref 70–99)
Glucose-Capillary: 112 mg/dL — ABNORMAL HIGH (ref 70–99)
Glucose-Capillary: 112 mg/dL — ABNORMAL HIGH (ref 70–99)
Glucose-Capillary: 137 mg/dL — ABNORMAL HIGH (ref 70–99)
Glucose-Capillary: 139 mg/dL — ABNORMAL HIGH (ref 70–99)
Glucose-Capillary: 92 mg/dL (ref 70–99)
Glucose-Capillary: 99 mg/dL (ref 70–99)

## 2011-02-15 LAB — CBC
HCT: 32.7 % — ABNORMAL LOW (ref 36.0–46.0)
HCT: 34 % — ABNORMAL LOW (ref 36.0–46.0)
Hemoglobin: 11.4 g/dL — ABNORMAL LOW (ref 12.0–15.0)
Hemoglobin: 11.9 g/dL — ABNORMAL LOW (ref 12.0–15.0)
MCHC: 34.9 g/dL (ref 30.0–36.0)
MCHC: 35.1 g/dL (ref 30.0–36.0)
MCV: 88.5 fL (ref 78.0–100.0)
MCV: 89 fL (ref 78.0–100.0)
Platelets: 109 10*3/uL — ABNORMAL LOW (ref 150–400)
Platelets: 152 10*3/uL (ref 150–400)
RBC: 3.69 MIL/uL — ABNORMAL LOW (ref 3.87–5.11)
RBC: 3.82 MIL/uL — ABNORMAL LOW (ref 3.87–5.11)
RDW: 13.8 % (ref 11.5–15.5)
WBC: 10 10*3/uL (ref 4.0–10.5)
WBC: 12.5 10*3/uL — ABNORMAL HIGH (ref 4.0–10.5)
WBC: 13.7 10*3/uL — ABNORMAL HIGH (ref 4.0–10.5)

## 2011-02-15 LAB — COMPREHENSIVE METABOLIC PANEL
ALT: 22 U/L (ref 0–35)
AST: 19 U/L (ref 0–37)
Albumin: 2.9 g/dL — ABNORMAL LOW (ref 3.5–5.2)
Alkaline Phosphatase: 66 U/L (ref 39–117)
BUN: 9 mg/dL (ref 6–23)
CO2: 29 mEq/L (ref 19–32)
Calcium: 8.4 mg/dL (ref 8.4–10.5)
Chloride: 104 mEq/L (ref 96–112)
Creatinine, Ser: 0.66 mg/dL (ref 0.4–1.2)
GFR calc Af Amer: >60 mL/min (ref >60)
GFR calc non Af Amer: >60 mL/min (ref >60)
Glucose, Bld: 110 mg/dL — ABNORMAL HIGH (ref 70–99)
Potassium: 3.5 mEq/L (ref 3.5–5.1)
Sodium: 139 mEq/L (ref 135–145)
Total Bilirubin: 0.9 mg/dL (ref 0.3–1.2)
Total Protein: 5.6 g/dL — ABNORMAL LOW (ref 6.0–8.3)

## 2011-02-15 LAB — PROTIME-INR
INR: 1.1 (ref 0.00–1.49)
Prothrombin Time: 14 seconds (ref 11.6–15.2)

## 2011-02-15 LAB — BASIC METABOLIC PANEL
BUN: 10 mg/dL (ref 6–23)
BUN: 17 mg/dL (ref 6–23)
BUN: 6 mg/dL (ref 6–23)
BUN: 7 mg/dL (ref 6–23)
CO2: 30 mEq/L (ref 19–32)
CO2: 31 mEq/L (ref 19–32)
Calcium: 8 mg/dL — ABNORMAL LOW (ref 8.4–10.5)
Calcium: 8.8 mg/dL (ref 8.4–10.5)
Chloride: 102 mEq/L (ref 96–112)
Chloride: 103 mEq/L (ref 96–112)
Creatinine, Ser: 0.57 mg/dL (ref 0.4–1.2)
Creatinine, Ser: 0.63 mg/dL (ref 0.4–1.2)
Creatinine, Ser: 0.78 mg/dL (ref 0.4–1.2)
GFR calc Af Amer: >60 mL/min (ref >60)
GFR calc non Af Amer: >60 mL/min (ref >60)
GFR calc non Af Amer: >60 mL/min (ref >60)
GFR calc non Af Amer: >60 mL/min (ref >60)
Glucose, Bld: 100 mg/dL — ABNORMAL HIGH (ref 70–99)
Glucose, Bld: 94 mg/dL (ref 70–99)
Glucose, Bld: 96 mg/dL (ref 70–99)
Potassium: 3.4 mEq/L — ABNORMAL LOW (ref 3.5–5.1)
Potassium: 3.5 mEq/L (ref 3.5–5.1)
Potassium: 3.6 mEq/L (ref 3.5–5.1)
Potassium: 3.7 mEq/L (ref 3.5–5.1)
Sodium: 140 mEq/L (ref 135–145)

## 2011-02-15 LAB — OSMOLALITY: Osmolality: 288 mOsm/kg (ref 275–300)

## 2011-03-02 ENCOUNTER — Other Ambulatory Visit: Payer: Self-pay | Admitting: *Deleted

## 2011-03-02 MED ORDER — VALSARTAN 160 MG PO TABS: 160.0000 mg | ORAL_TABLET | Freq: Every day | ORAL | Status: DC

## 2011-03-03 ENCOUNTER — Other Ambulatory Visit: Payer: Self-pay | Admitting: Cardiothoracic Surgery

## 2011-03-03 DIAGNOSIS — I7100 Dissection of unspecified site of aorta: Secondary | ICD-10-CM

## 2011-03-15 NOTE — Consult Note (Signed)
[name], GUNDRY NO.:  [id number]   MEDICAL RECORD NO.:  [id number]          PATIENT TYPE:  INP   LOCATION:  2102                         FACILITY:  [facility]   PHYSICIAN:  Verne Carrow, MDDATE OF BIRTH:  1951/04/28   DATE OF CONSULTATION:  12/03/2008  DATE OF DISCHARGE:                                 CONSULTATION   REASON FOR CONSULTATION:  New left bundle-branch block in the setting of  a type B aortic aneurysm.   HISTORY OF PRESENT ILLNESS:  A 60 year old obese Caucasian female  admitted as transfer from [facility] secondary to chest  pain and abdominal pain.  The patient states that in the early part of  the day, the patient had which she described as a cramp in her lower  right abdomen which she felt was a menstrual cramp that she felt as  rolling up into my abdomen and to my chest which was severe pain and  radiated into the back.  She went to the emergency room at [facility] and a CT scan was completed revealing a type B aortic dissection  with thrombosed false lumen extending into the common iliac arteries.  The patient was transferred to [facility] for further  evaluation.  The patient is currently in ICU and EKG revealed a new left  bundle-branch block which was new for the patient and we are asked to  evaluate.  The patient has no prior cardiac workup or cardiac history  and currently is pain-free after use of Dilaudid and morphine.  Initially, the patient was placed on esmolol and labetalol and she was  hypertensive on admission with a blood pressure of greater than 200  systolic.  The patient is now currently on Lopressor p.o. b.i.d.   REVIEW OF SYSTEMS:  Positive for sweating, pain rolling up from  abdomen to lower chest, nausea, back pain, and abdominal pain.  Review  of systems otherwise reviewed as negative.   PAST MEDICAL HISTORY:  1. Hypertension x5 years.  2. Depression.  3. Shingles.  4.  Glaucoma.   PAST SURGICAL HISTORY:  Hysterectomy and bilateral tubal ligation.   SOCIAL HISTORY:  She lives in [place].  She is married.  She has one  child.  She does not smoke.  She does not drink alcohol.   FAMILY HISTORY:  Father deceased from brain aneurysm.  She has both  parents with high blood pressure.   CURRENT MEDICATIONS:  1. Taxol 10 mg daily.  2. Vicodin 5/500 mg q.4 h. p.r.n.  3. Diovan 320/12.5 mg daily.  4. Prilosec 20 mg daily.  5. Aspirin 81 mg daily.  6. Motrin 600 mg p.r.n.   ALLERGIES:  No known drug allergies.   CURRENT LABORATORY DATA:  Sodium 134, potassium 3.7, chloride 100, CO2  26, BUN 17, creatinine 0.78, and glucose 193.  Hemoglobin 12.1,  hematocrit 36.7, white blood cells 13.7, and platelets 152.  Troponin  less than 0.01 and 0.01 respectively.  Chest x-ray revealing low lung  volumes with atelectasis.  EKG revealing normal sinus rhythm with  incomplete left  bundle-branch block, rate of 66 beats per minute.  She  has some lateral T-wave flattening noted.   PHYSICAL EXAMINATION:  VITAL SIGNS:  Blood pressure 115/50, heart rate  60, respirations 19, temperature 97.9, and O2 sat 97% on 2 L.  HEENT:  Head is normocephalic and atraumatic.  EYES:  PERRLA.  MOUTH:  Mucous membranes pink and moist.  Tongue is midline.  NECK:  Supple.  There is no JVD.  No carotid bruits appreciated.  CARDIOVASCULAR:  Regular rate and rhythm without murmurs, rubs, or  gallops.  Pulses are equal bilaterally radially, femorally, and dorsalis  pedis pulse.  LUNGS:  Clear to auscultation.  ABDOMEN:  Soft and nontender.  There is no bruits.  I did not palpate  the abdomen.  EXTREMITIES:  Without clubbing, cyanosis, or edema.  NEURO:  Cranial nerves II-XII are grossly intact.   IMPRESSION:  Type B aortic dissection thoracic descending up and down to  the iliacs with sudden onset of pain yesterday, brought to emergency  department, hypertensive on admit, now blood  pressure controlled on  Lopressor 12.5 mg b.i.d. after initially being on esmolol yesterday.  EKG this a.m. with new left bundle-branch block.  The patient with  recurrent epigastric pain this afternoon, concern for extension of  dissection.  Bilateral upper extremity pulses were equal on latest exam.  EKG with normal sinus rhythm and intraventricular conduction defects  nonspecific T-wave changes.  Agree with repeat CT scan.  Surgery is  aware.  We would continue to cycle cardiac enzymes and follow with you  making further recommendations throughout the hospital course.      Bettey Mare. Lyman Bishop, NP      Verne Carrow, MD  Electronically Signed    KML/MEDQ  D:  12/03/2008  T:  12/04/2008  Job:  [id number]

## 2011-03-15 NOTE — Consult Note (Signed)
NAMEMarland Kitchen  HALA, NARULA NO.:  [id number]   MEDICAL RECORD NO.:  [id number]          PATIENT TYPE:  INP   LOCATION:  2025                         FACILITY:  [facility]   PHYSICIAN:  Kerin Perna, M.D.  DATE OF BIRTH:  1951/06/07   DATE OF CONSULTATION:  12/06/2008  DATE OF DISCHARGE:                                 CONSULTATION   PHYSICIAN REQUESTING CONSULTATION:  Coralyn Helling, MD   PRIMARY CARE PHYSICIAN:  Johnnye Lana, MD, in [facility].   REASON FOR CONSULTATION:  Type B aortic dissection and hematoma.   CHIEF COMPLAINT:  Abdominal discomfort.   HISTORY OF PRESENT ILLNESS:  I was asked to evaluate this 60 year old  obese Caucasian female for evaluation and treatment of recently  diagnosed first onset type B aortic dissection and hematoma.  The  patient was transferred from [facility] on December 02, 2008, after she developed acute onset of upper abdominal pain radiating  to her back and chest.  A CT scan was performed at the emergency room in  [facility] [place] and that had demonstrated a hematoma surrounding the  descending thoracic aorta just distal to the left subclavian extending  into the abdomen.  Just below the diaphragm, there was a true-false  lumen to the aorta which then after short segment continuous hematoma in  the abdomen.  There is no evidence of pericardial effusion, pleural  effusion, or involvement of the descending aorta.  The thoracic aorta  was not enlarged with a diameter of approximately 2.5-3.0 cm.  The lung  fields were clear and there was no evidence of pulmonary embolus.  No  mediastinal adenopathy.   The patient was transferred to the MICU at [facility] for blood  pressure control.  A 2-D echo showed trivial AI with good LV function,  some LVH and no pericardial effusion.  After 48 hours, she had recurrent  upper abdominal epigastric pain and a followup CT scan was performed  here, which  demonstrates thrombosis of the entire false lumen and no  extension of the hematoma to the ascending aorta, no pleural effusion,  no pericardial effusion.  The patient is now otherwise ambulating with  good blood pressure control after Lopressor 50 mg b.i.d. was added to  her ARB (Diovan which she takes at home).  There are no neurologic  symptoms.   PAST MEDICAL HISTORY:  1. Obesity.  2. Hypertension.  3. Depression.  4. History of shingles and glaucoma.   PAST SURGICAL HISTORY:  TAH.   SOCIAL HISTORY:  The patient is married, lives in [place], and is  retired.  She does not smoke or use alcohol.   FAMILY HISTORY:  Negative for thoracic or abdominal aortic aneurysm  disease.  Positive for brain aneurysm in her father.  Positive family  history for hypertension.   HOME MEDICATIONS:  Paxil, Vicodin, Diovan 320/12.5 mg daily, Prilosec 20  mg, aspirin 81 mg, and Motrin 600 mg p.r.n.   ALLERGIES:  None.   PHYSICAL EXAMINATION:  VITAL SIGNS:  The patient is 5 feet 6 inches and  weighs 182 pounds.  She is in sinus rhythm with left bundle, heart rate  60-65.  Blood pressure is controlled, 128/70.  HEENT:  Normocephalic.  NECK:  Without JVD or bruit.  LYMPHATICS:  No palpable cervical or supraclavicular adenopathy.  LUNGS:  Breath sounds are clear.  CARDIAC:  Rhythm is regular without S3, gallop, murmur, or rub.  EXTREMITIES:  Peripheral pulses are 2+ in all extremities.  She has no  evidence of venous insufficiency of lower extremity.  ABDOMEN:  Nontender without pulsatile mass.  NEUROLOGIC:  Intact.  She has no peripheral edema.   PLAN:  Medical management of blood pressure control and activity  limitations would be the best therapeutic option and I plan on seeing  the patient back in followup in the office with a CT scan in  approximately 3 weeks after discharge.      Kerin Perna, M.D.  Electronically Signed     PV/MEDQ  D:  12/06/2008  T:  12/06/2008  Job:   [id number]

## 2011-03-15 NOTE — Discharge Summary (Signed)
NAMEEMARI, HREHA                ACCOUNT NO.:  [id number]   MEDICAL RECORD NO.:  [id number]          PATIENT TYPE:  INP   LOCATION:  2025                         FACILITY:  [facility]   PHYSICIAN:  Oley Balm. Sung Amabile, MD   DATE OF BIRTH:  26-Aug-1951   DATE OF ADMISSION:  12/02/2008  DATE OF DISCHARGE:  12/09/2008                               DISCHARGE SUMMARY   DISCHARGE DIAGNOSES:  1. Type B aortic dissection.  2. Hypertension.  3. Pain.   HISTORY OF PRESENT ILLNESS:  [name] is a morbidly obese 60-  year-old white female who was transferred from [facility]  to [facility] on December 02, 2008.  She is noted to have a type  2 aortic dissection.   PAST MEDICAL HISTORY:  1. Hypertension.  2. Depression.  3. Recent shingles of the face and extending into the ear.  4. Glaucoma.   PROCEDURES:  1. A left internal jugular catheter from February 2 to February 2.  2. A right internal jugular catheter from February 2 to February 7.   She was also on sequential hose and Protonix as an inpatient.  Note that  while she was at [facility] she was noted to have a systolic  blood pressure greater than 200.  When she arrived at [facility] she  required interventions with IV labetalol and subsequently transitioned  to p.o. antihypertensive.   LABORATORY DATA:  Sodium 137, potassium 3.4, chloride 103, CO2 is 27,  BUN is 6, creatinine 0.63, glucose is 94, hemoglobin 11.9, hematocrit  34, WBCs 12.5, platelets are 129, INR is 1.1, PT is 14, AST is 19, ALT  is 22, alkaline phosphatase 66, total bilirubin 0.9, albumin 2.9,  troponin-I is less than 0.1, CK is 49, CK-MB is 1, magnesium is 2.2,  calcium is 8.8, sed rate is pending at the time of this dictation, serum  osmolality is 288, urine sodium is 27, urine osmolality is 480.   CONSULTS:  1. Dr. Kathlee Nations Trigt, cardiovascular and thoracic surgery.  2. Dr. Myrene Galas, [facility].   HOSPITAL COURSE  BY DISCHARGE DIAGNOSIS:  1. Aortic dissection, descending thoracic.  CT scan demonstrated a      type B thorough abdominal aortic dissection without pericardial or      pleural effusions, scattered areas of subpleural atelectasis, a non-      calcified 2-mm right upper lobe nodule.  It is recommended that she      have a CT repeat in 1 year.  Chest x-ray on February 8 shows      resolved right pleural effusion and resolving left pleural      effusion.  She was evaluated by cardiovascular and thoracic surgery      for her aortic dissection.  She is not a surgical candidate at this      time.  She will be followed by the cardiovascular and thoracic      surgeons on an outpatient basis, their office will call.  2. Hypertension.  She has malignant hypertension, cardiology consult  was obtained along with the institution of the antihypertensives.      Her blood pressure was controlled and she has now been transitioned      to p.o. with Toprol XL 100 mg along with Diovan 320/12.5 and      hydrochlorothiazide.  She will be followed on an outpatient basis      by Dr. Shary Decamp and adjustments will be made to her antihypertensives      as needed.  3. Pain, described as right flank pain.  This is intermittent.  Noted      she has been on narcotics at home for a recent bout of shingles.      She has required narcotics, she has been given Ultram along with      Tylenol and instructed not to utilize non-steroidal anti-      inflammatories at this point and continued to be evaluated by Dr.      Shary Decamp on an outpatient basis.  4. Hypokalemia which has been repleted.   DISCHARGE MEDICATIONS:  1. Paxil 10 mg daily.  2. Vicodin 5/500 as needed and 4 times a day.  3. Diovan 320/12.5 one daily.  4. Prilosec 20 mg one daily.  5. Aspirin, has been instructed not to utilize at this time.  6. Ibuprofen, has been instructed not to utilize at this time.   NEW MEDICATIONS:  1. Toprol XL 100 mg one a day.   2. Ultram 50 mg one to two tabs every 6 hours as needed.   She has a followup appointment with Dr. Shary Decamp on Thursday, December 11, 2008 at 10:45 am.  She will follow up with Dr. Donata Clay and his office  will call and make an appointment.  She should be on a heart-healthy  diet, increased activity as tolerated.  She is being discharged home in  improved condition.      Devra Dopp, MSN, ACNP      Oley Balm. Sung Amabile, MD  Electronically Signed    SM/MEDQ  D:  12/09/2008  T:  12/09/2008  Job:  [id number]   cc:   Kerin Perna, M.D.  Feliciana Rossetti, MD

## 2011-03-15 NOTE — Assessment & Plan Note (Signed)
OFFICE VISIT   [name], [name]  DOB:  1951/10/27                                        August 28, 2009  CHART #:  [id number]   CURRENT PROBLEMS:  1. 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  2. Hypertension.  3. Obesity.  4. Recent treatment for adenocarcinoma of the right breast at [facility].   CURRENT MEDICATIONS:  Diovan 160/25, Lopressor 50 mg daily, aspirin 81  mg daily, Zocor and niacin.   PRESENT ILLNESS:  The patient returns for a 60-month CT angiogram of the  thoracic aorta in follow up of her previous problem with a type B  dissection.  She was seen last in April at which time.   Dictation ended at this point.   Kerin Perna, M.D.  Electronically Signed   PV/MEDQ  D:  08/28/2009  T:  08/29/2009  Job:  [id number]

## 2011-03-15 NOTE — Assessment & Plan Note (Signed)
OFFICE VISIT   [name], [name]  DOB:  10-18-1951                                        Mar 10, 2010  CHART #:  [id number]   CURRENT PROBLEMS:  1. Status post type B aortic dissection of the descending thoracic      aorta, February 2010.  2. Hypertension.  3. Treatment for limited stage right breast cancer at [facility], summer 2010 with recently completed radiation      therapy.   CURRENT MEDICATIONS:  Toprol-XL 50 mg q.a.m. and 100 mg p.o. q.p.m.,  Diovan 160/25 daily, aspirin 81 mg daily, Zocor 20 mg daily as well as  Prilosec, Paxil, Singulair, Xanax, Ativan, and multivitamin.  She is  taking Femara and Herceptin under direction of Dr. Amie Critchley.   Since the patient's last visit in October 2010, which showed resolution  of the false lumen of her type B aortic dissection with a normal  thoracic aortic diameter.  She has had no recurrent symptoms of pain.  She states her blood pressure has been somewhat elevated and that led to  a recent increase in the doses of her beta-blocker.  Her weight has been  stable.  She has normal LV function by an echo performed last year at  [facility].   PHYSICAL EXAMINATION:  Vital Signs:  Blood pressure 140/88, pulse 54,  respirations 16, and saturation 98% on room air.  General:  She is alert  and pleasant.  Lungs:  Breath sounds are clear and equal.  Cardiac:  Rhythm is regular without gallop or murmur.  Extremities:  Peripheral  pulses are intact in all extremities and there is no peripheral edema.  Neurologic:  Intact.   DIAGNOSTIC TESTS:  A PA and lateral chest x-ray reveals clear lung  fields, stable cardiac silhouette, and no pleural effusion.  No active  disease.   IMPRESSION AND PLAN:  The patient has done well from a standpoint of her  aortic disease and history of type B dissection.  She understands her  activity limitations and the importance of  blood pressure control.  We will plan  on seeing her back in February  2012 for a 2-year CT scan evaluation of her thoracic aorta.   Kerin Perna, M.D.  Electronically Signed   PV/MEDQ  D:  03/10/2010  T:  03/10/2010  Job:  [id number]   cc:   Feliciana Rossetti, MD  [employer], MD

## 2011-03-15 NOTE — Assessment & Plan Note (Signed)
OFFICE VISIT   Brittney, STRANDBERG  DOB:  1951-08-10                                        February 27, 2009  CHART #:  [id number]   CURRENT PROBLEMS:  1. Type B descending thoracic aortic dissection and hematoma sustained      on December 02, 2008.  2. Hypertension.  3. Obesity.   CURRENT MEDICATIONS:  1. Diovan HCT 160/12.5 mg daily.  2. Toprol-XL 75 mg daily.  3. Paxil 10 mg daily.  4. Prilosec 20 mg daily.  5. Xanax 1 mg nightly.   PRESENT ILLNESS:  [name] is a 60 year old hypertensive Caucasian  female who returns for her second office visit with CT scan after she  presented with an acute type B dissection with pain in early February  was hospitalized for approximately 1 week for observation and blood  pressure control.  She has continued to do well at home.  On her last  office visit with CT scan in late February, there was evidence of  resolution of the dissection with thrombosis of the false lumen and some  resolution of the periaortic hematoma in the mediastinum.  Since that  time, she has had no recurrent pain.  She is going to physical therapy  as well as relaxation therapy.  She is on a diet and has lost another 12  pounds.  She is monitoring her blood pressure at home very carefully,  and her medications are being regulated by Dr. Shary Decamp.  Her blood  pressures have remained under excellent control.  She recently started  aspirin 81 mg as well as niacin without difficulty.  She is a nonsmoker.   PHYSICAL EXAMINATION:  Blood pressure 132/85, pulse 54, respirations 18,  saturation 97%.  Her weight is 232 pounds.  She is alert and pleasant.  Breath sounds are clear and equal.  Cardiac rhythm is regular without  murmur.  Abdominal exam is soft, nontender.  Peripheral pulses are  intact.  Neurologic exam is nonfocal.   LABORATORY DATA:  I reviewed the CT scan with her which continued to  show improvement.  There is no false lumen and the  periaortic hematoma  is now almost completely resolved.  There is no significant pleural  effusion.  Her ascending aorta measures 2.8 cm.  Her descending thoracic  aorta measures 2.2 cm.  These are normal measurements.   PLAN:  The patient will continue her current excellent medical care.  I  plan on seeing her back in 6-8 months with a followup CT scan.   Kerin Perna, M.D.  Electronically Signed   PV/MEDQ  D:  02/27/2009  T:  02/28/2009  Job:  [id number]   cc:   Feliciana Rossetti, MD

## 2011-03-15 NOTE — Assessment & Plan Note (Signed)
OFFICE VISIT   Brittney, Davis  DOB:  [birth year]                                        December 24, 2008  CHART #:  [id number]   CURRENT PROBLEMS:  1. 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  2. Hypertension.  3. Obesity.   CURRENT MEDICATIONS:  Diovan HCT 320/12.5 mg daily, Toprol-XL 150 mg  daily, Paxil 10 mg daily, Prilosec 20 mg daily, Xanax 1 mg nightly.   PRESENT ILLNESS:  The patient returns for her first office visit  following her hospitalization earlier this month for an acute type B  dissection.  Her second hospital CT scan showed an obliterated false  lumen with a circumferential hematoma around the descending thoracic  aorta from beyond the left subclavian to the diaphragm.  The aortic  lumen diameter was normal.  There is no significant pleural effusion and  her symptoms gradually resolve with bedrest and blood pressure control.  Since returning home, she has continued to progress.  Dr. Shary Decamp was  carefully following her blood pressure and has adjusted her medications.  She has also been on a diet and successfully lost 12 pounds.  She  returns now with a CT scan for further assessment and recommendations on  increasing her activity.   PHYSICAL EXAMINATION:  VITAL SIGNS:  Blood pressure 110/70, pulse 60,  respirations 18, and saturation 96% on room air.  Weight 240 pounds.  GENERAL:  She is alert and pleasant.  LUNGS:  Breath sounds are clear.  EXTREMITIES:  Peripheral pulses are intact in all extremities.  CARDIAC:  Rhythm is regular without murmur or gallop.   LABORATORY DATA:  A CT scan shows small hematoma surrounding the  descending thoracic aorta.  There is no significant pleural effusion and  overall, the hematoma has improved since her last CT scan in the  hospital.   PLAN:  I told the patient she could resume driving lifting up to 10  pounds.  I feel it is safe for her to start cardiac rehabilitation  under  supervision at [facility].  We will try to arrange for her to be  seen by a nutritionist at [facility] as well.  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   I will see her back in approximately 2 months with a followup CT scan.    Kerin Perna, M.D.  Electronically Signed   PV/MEDQ  D:  12/24/2008  T:  12/25/2008  Job:  [id number]   cc:   Feliciana Rossetti, MD

## 2011-03-15 NOTE — Assessment & Plan Note (Signed)
OFFICE VISIT   [name]  DOB:  [birth year]                                        August 28, 2009  CHART #:  [id number]   CURRENT PROBLEMS:  1. History of type B aortic dissection of the descending thoracic      aorta in February 2010.  2. Hypertension.  3. Obesity.  4. Recent treatment of right breast cancer at [facility]   CURRENT MEDICATIONS:  Diovan HCT 160/12.5 mg daily, Toprol 50 mg daily,  aspirin 81 mg daily and Zocor 20 mg daily.   PRESENT ILLNESS:  The patient returns for a 54-month follow up with CT  angiogram of her thoracic aorta.  Her last study in April showed  thrombosis of the false lumen of the descending thoracic aorta and some  resolution of the periaortic hematoma.  She has had no more back pain.  The CT angiogram done today shows a normal thoracic aorta.  The false  lumen is resolved.  The periaortic hematoma has resolved and the  penetrating ulcer has resolved.   While she was at [facility], a 2-D echo had been performed and the report is  provided today which shows normal LV function with EF of 60% and a  normal aortic root and no evidence of aortic valve disease.   PHYSICAL EXAMINATION:  Vital Signs:  Blood pressure 110/80, pulse 60,  respirations 18, saturation on room air 99%.  Lungs:  Breath sounds are  clear.  Cardiac:  Rhythm is regular.  Extremities:  Peripheral pulses  are intact.  Neurologic:  Normal.   PLAN:  The patient is now 8-1/2 months following a type B dissection  with a normal aorta on scanning.  She should be able to tolerate her  chemo radiation as well as prophylactic doses of Lovenox as needed.  Blood pressure control and lipid control are the goals of therapy for  her  thoracic aortic disease.  I will plan on seeing her back in 6 months  with a chest x-ray to avoid excessive radiation dosing from more CT  scans.   Kerin Perna, M.D.  Electronically Signed   PV/MEDQ  D:   08/28/2009  T:  08/29/2009  Job:  [id number]   cc:   [name], M.D.

## 2011-03-24 ENCOUNTER — Telehealth: Payer: Self-pay | Admitting: Cardiovascular Disease

## 2011-03-24 DIAGNOSIS — R609 Edema, unspecified: Secondary | ICD-10-CM

## 2011-03-24 MED ORDER — FUROSEMIDE 40 MG PO TABS: 40.0000 mg | ORAL_TABLET | Freq: Every day | ORAL | Status: DC

## 2011-03-24 NOTE — Telephone Encounter (Signed)
Patient has been on vacation for the past weekend at the beach and states that her feet are swollen. She was doing a lot of walking and riding in the car. No CP or SOB. Advised her to take an extra 20 mg of Lasix today and to eat a banana. I also advised her to elevate her feet when she is sitting. She is aware and will call us back if she continues to have to take an extra Lasix dose.  Floreen Comber, 2:07 PM

## 2011-03-24 NOTE — Telephone Encounter (Signed)
Pt was told to call if needed to increase fluid pill and pt will be traveling and her feet are swelling can she increase it, and how much?

## 2011-04-07 ENCOUNTER — Ambulatory Visit: Payer: Self-pay | Admitting: Cardiovascular Disease

## 2011-04-07 ENCOUNTER — Encounter: Payer: Self-pay | Admitting: Cardiovascular Disease

## 2011-04-07 DIAGNOSIS — I5022 Chronic systolic (congestive) heart failure: Secondary | ICD-10-CM | POA: Insufficient documentation

## 2011-04-27 ENCOUNTER — Ambulatory Visit: Payer: PRIVATE HEALTH INSURANCE | Admitting: Cardiothoracic Surgery

## 2011-04-27 ENCOUNTER — Other Ambulatory Visit: Payer: Self-pay

## 2011-05-12 ENCOUNTER — Ambulatory Visit (INDEPENDENT_AMBULATORY_CARE_PROVIDER_SITE_OTHER): Payer: PRIVATE HEALTH INSURANCE | Admitting: Cardiovascular Disease

## 2011-05-12 ENCOUNTER — Other Ambulatory Visit: Payer: Self-pay

## 2011-05-12 ENCOUNTER — Encounter: Payer: Self-pay | Admitting: Cardiovascular Disease

## 2011-05-12 VITALS — BP 122/76 | HR 83 | Ht 62.5 in | Wt 242.8 lb

## 2011-05-12 DIAGNOSIS — I429 Cardiomyopathy, unspecified: Secondary | ICD-10-CM

## 2011-05-12 DIAGNOSIS — I82409 Acute embolism and thrombosis of unspecified deep veins of unspecified lower extremity: Secondary | ICD-10-CM

## 2011-05-12 DIAGNOSIS — R609 Edema, unspecified: Secondary | ICD-10-CM

## 2011-05-12 MED ORDER — FUROSEMIDE 40 MG PO TABS: 40.0000 mg | ORAL_TABLET | Freq: Two times a day (BID) | ORAL | Status: DC

## 2011-05-12 NOTE — Patient Instructions (Addendum)
Your physician wants you to follow-up in: 6 months with Dr. Clifton James. You will receive a reminder letter in the mail two months in advance. If you don't receive a letter, please call our office to schedule the follow-up appointment.  .Your physician recommends that you return for lab work in: 7-10 days. --[id number]  Your physician has requested that you have an echocardiogram. Echocardiography is a painless test that uses sound waves to create images of your heart. It provides your doctor with information about the size and shape of your heart and how well your heart's chambers and valves are working. This procedure takes approximately one hour. There are no restrictions for this procedure.    Your physician has recommended you make the following change in your medication: Stop Coumadin. Increase furosemide to 40 mg by mouth twice daily.

## 2011-05-12 NOTE — Assessment & Plan Note (Signed)
Will increase Lasix to 40 mg po BID. Check BMET one week. Will repeat echo. If EF still less than 35%, will need to be considered for ICD.

## 2011-05-12 NOTE — Assessment & Plan Note (Signed)
Will stop coumadin.

## 2011-05-12 NOTE — Progress Notes (Signed)
History of Present Illness:60 yo WF with history of HTN, hyperlipidemia, Type B aortic dissection February 2010 and LBBB who I saw as a new patient in October 2011. I saw her as a consult at [facility] in February 2010 when she was admitted with abdominal pain. She was found to have LBBB and a Type B aortic dissection which was managed conservatively at the time. She had no other cardiac issues. Her aortic dissection has healed. She has followed with Dr.  Morton Peters for her aortic dissection. She  was admitted to [facility] on December 5th, 2011 with volume overload and pulmonary edema. Echo showed EF of 25%. Cardiac cath on 10/06/10 with no evidence of coronary artery disease. It was felt that this may be related to her chemotherapy. She had a DVT in her left leg December 2011 and has been on coumadin. She has had no pain or swelling in her left leg. She has been feeling well. No chest pain or SOB. Her weight has been stable.     Past Medical History  Diagnosis Date  . Nonischemic cardiomyopathy     Admission 12/11 with EF 25%, normal coronary arteries by cath 12/11; NICM presumed to be secondary to chemotherapy for breast cancer  . DVT (deep venous thrombosis) Diagnosed 10/18/2010  . Obesity   . 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 Hypertension   . Glaucoma   . Depression   . Breast cancer 07/2009    Stage 3 lumpectomy, radiation, chemotherapy; finished chemo October  . History of shingles   . Hyperlipidemia     Past Surgical History  Procedure Date  . Tubal ligation     Bilateral  . Oophorectomy     Single  . Breast lumpectomy     Right breast  . Tonsillectomy     Current Outpatient Prescriptions  Medication Sig Dispense Refill  . albuterol (PROVENTIL) (2.5 MG/3ML) 0.083% nebulizer solution Take 2.5 mg by nebulization as needed.        . ALPRAZolam (XANAX) 1 MG tablet Take 1 mg by mouth as needed.        [name] aspirin 81 MG EC tablet Take 81 mg by mouth daily.         . benzonatate (TESSALON) 200 MG capsule Take 200 mg by mouth as needed.        . Bepotastine Besilate (BEPREVE) 1.5 % SOLN Apply to eye as needed.        . bimatoprost (LUMIGAN) 0.03 % ophthalmic solution Place 1 drop into both eyes at bedtime.        . Calcium Carbonate-Vitamin D (CALCIUM-VITAMIN D3) 600-200 MG-UNIT TABS Take 1 tablet by mouth 2 (two) times daily.        . carvedilol (COREG) 3.125 MG tablet Take 3.125 mg by mouth 2 (two) times daily with a meal.        . fexofenadine (ALLEGRA) 180 MG tablet Take 180 mg by mouth as needed.        . fluticasone (FLONASE) 50 MCG/ACT nasal spray Place 2 sprays into the nose as needed.        . furosemide (LASIX) 40 MG tablet Take 1 tablet (40 mg total) by mouth daily.  30 tablet  8  . HYDROcodone-acetaminophen (NORCO) 5-325 MG per tablet Take 1 tablet by mouth as needed.        [name] letrozole (FEMARA) 2.5 MG tablet Take 2.5 mg by mouth every evening.        [name]  montelukast (SINGULAIR) 10 MG tablet Take 10 mg by mouth as needed.        . Multiple Vitamin (MULTIVITAMIN) tablet Take 1 tablet by mouth daily.        . niacin (NIASPAN) 1000 MG CR tablet Take 1,000 mg by mouth at bedtime.        [name] 20 MG capsule Take 20 mg by mouth 2 (two) times daily.        [name] PARoxetine (PAXIL) 10 MG tablet Take 10 mg by mouth every morning.        . potassium chloride (MICRO-K) 10 MEQ CR capsule Take 10 mEq by mouth 2 (two) times daily.        . ranitidine (ZANTAC) 300 MG capsule Take 300 mg by mouth at bedtime.        . simvastatin (ZOCOR) 20 MG tablet Take 20 mg by mouth at bedtime.        . valsartan (DIOVAN) 160 MG tablet Take 1 tablet (160 mg total) by mouth daily.  30 tablet  11  . warfarin (COUMADIN) 5 MG tablet Take 5 mg by mouth as directed. 1 cap Mon, Wed, Fri        No Known Allergies  History   Social History  . Marital Status: Married    Spouse Name: N/A    Number of Children: N/A  . Years of Education: N/A   Occupational  History  . [employer]  . Retired from Photographer    Social History Main Topics  . Smoking status: Never Smoker   . Smokeless tobacco: Not on file  . Alcohol Use: No  . Drug Use: No  . Sexually Active: Not on file   Other Topics Concern  . Not on file   Social History Narrative   MarriedOne child[place] in [place]    Family History  Problem Relation Age of Onset  . Hypertension Mother   . Hypertension Father   . Hypertension Brother   . Hypertension Maternal Grandfather   . Heart attack Maternal Grandfather     MI  . Coronary artery disease Maternal Grandfather   . Hypertension Paternal Grandfather   . Heart attack Paternal Grandfather     MI  . Coronary artery disease Paternal Grandfather   . Hypertension Brother     Review of Systems:  As stated in the HPI and otherwise negative.   BP 122/76  Pulse 83  Ht 5' 2.5" (1.588 m)  Wt 242 lb 12.8 oz (110.133 kg)  BMI 43.70 kg/m2  Physical Examination: General: Well developed, well nourished, NAD HEENT: OP clear, mucus membranes moist SKIN: warm, dry. No rashes. Neuro: No focal deficits Musculoskeletal: Muscle strength 5/5 all ext Psychiatric: Mood and affect normal Neck: No JVD, no carotid bruits, no thyromegaly, no lymphadenopathy. Lungs:Clear bilaterally, no wheezes, rhonci, crackles Cardiovascular: Regular rate and rhythm. No murmurs, gallops or rubs. Abdomen:Soft. Bowel sounds present. Non-tender.  Extremities: No lower extremity edema. Pulses are 2 + in the bilateral DP/PT.  EKG: NSR, LBBB. Rate 83 bpm.

## 2011-05-13 ENCOUNTER — Ambulatory Visit (INDEPENDENT_AMBULATORY_CARE_PROVIDER_SITE_OTHER): Payer: PRIVATE HEALTH INSURANCE | Admitting: Cardiothoracic Surgery

## 2011-05-13 ENCOUNTER — Ambulatory Visit
Admission: RE | Admit: 2011-05-13 | Discharge: 2011-05-13 | Disposition: A | Discharge status: Home / Self Care | Payer: PRIVATE HEALTH INSURANCE | Source: Ambulatory Visit | Attending: Cardiothoracic Surgery | Admitting: Cardiothoracic Surgery

## 2011-05-13 DIAGNOSIS — I7101 Dissection of thoracic aorta: Secondary | ICD-10-CM

## 2011-05-13 DIAGNOSIS — I7100 Dissection of unspecified site of aorta: Secondary | ICD-10-CM

## 2011-05-13 MED ORDER — IOHEXOL 300 MG/ML  SOLN
100.0000 mL | Freq: Once | INTRAMUSCULAR | Status: AC | PRN
  Administered 2011-05-13: 100 mL via INTRAVENOUS

## 2011-05-20 ENCOUNTER — Other Ambulatory Visit (INDEPENDENT_AMBULATORY_CARE_PROVIDER_SITE_OTHER): Payer: PRIVATE HEALTH INSURANCE | Admitting: *Deleted

## 2011-05-20 ENCOUNTER — Ambulatory Visit (HOSPITAL_COMMUNITY): Payer: PRIVATE HEALTH INSURANCE | Attending: Cardiovascular Disease | Admitting: Radiology

## 2011-05-20 ENCOUNTER — Other Ambulatory Visit: Payer: PRIVATE HEALTH INSURANCE | Admitting: *Deleted

## 2011-05-20 ENCOUNTER — Other Ambulatory Visit (HOSPITAL_COMMUNITY): Payer: Self-pay | Admitting: Cardiovascular Disease

## 2011-05-20 DIAGNOSIS — I429 Cardiomyopathy, unspecified: Secondary | ICD-10-CM | POA: Insufficient documentation

## 2011-05-20 DIAGNOSIS — I079 Rheumatic tricuspid valve disease, unspecified: Secondary | ICD-10-CM | POA: Insufficient documentation

## 2011-05-20 DIAGNOSIS — E785 Hyperlipidemia, unspecified: Secondary | ICD-10-CM | POA: Insufficient documentation

## 2011-05-20 DIAGNOSIS — I10 Essential (primary) hypertension: Secondary | ICD-10-CM | POA: Insufficient documentation

## 2011-05-20 DIAGNOSIS — I509 Heart failure, unspecified: Secondary | ICD-10-CM

## 2011-05-20 DIAGNOSIS — I059 Rheumatic mitral valve disease, unspecified: Secondary | ICD-10-CM | POA: Insufficient documentation

## 2011-05-20 LAB — BASIC METABOLIC PANEL
Calcium: 9.1 mg/dL (ref 8.4–10.5)
GFR: 86.3 mL/min (ref >60.00)
Glucose, Bld: 98 mg/dL (ref 70–99)
Sodium: 143 mEq/L (ref 135–145)

## 2011-05-20 MED ORDER — PERFLUTREN PROTEIN A MICROSPH IV SUSP
0.5000 mL | Freq: Once | INTRAVENOUS | Status: AC
  Administered 2011-05-20: 0.5 mL via INTRAVENOUS

## 2011-05-24 ENCOUNTER — Telehealth: Payer: Self-pay | Admitting: Cardiovascular Disease

## 2011-05-24 DIAGNOSIS — I429 Cardiomyopathy, unspecified: Secondary | ICD-10-CM

## 2011-05-24 NOTE — Assessment & Plan Note (Signed)
OFFICE VISIT  [name], [name] DOB:  Feb 05, 1951                                        May 13, 2011 CHART #:  [id number]  CURRENT PROBLEMS: 1. Status post spontaneous type B aortic dissection of the descending     thoracic aorta February 2010, resolved. 2. Hypertension. 3. History of breast cancer treated at [facility]     with lumpectomy and chemoradiation under the direction of Dr.     Amie Critchley (right breast).  PRESENT ILLNESS:  This patient is a 60 year old Caucasian female nonsmoker who returns for final visit with a CT scan to evaluate her thoracic aortic disease.  Two years ago, she had a type B dissection which was treated with conservative medical therapy and blood pressure control and has shown gradual resolution on serial CT scans.  Her last visit was May of last year at which time the false lumen of the descending thoracic aorta had resolved and the thoracic aorta had normal diameter.  She was hospitalized under the Cardiology Service in December this year for symptoms of CHF and was found to have nonischemic cardiomyopathy probably from her chemotherapy with an EF of 25% which had decreased from 60% from an echo done at [facility] approximately a year and a half previously.  She is currently taking medical therapy including Coreg, Diovan, Lasix, aspirin, and Zocor under the direction Dr. Clifton James.  She denies any chest or back pain.  She states her blood pressure has been under good control.  She does remain obese.  PHYSICAL EXAMINATION:  VITAL SIGNS:  Blood pressure 130/80, pulse 80 and regular, saturation 99% on room air. GENERAL:  She is alert, pleasant, and appropriate. LUNGS:  Breath sounds are clear and equal. CARDIAC:  Rhythm is regular without gallop or murmur.  Peripheral pulses are all intact 2+. NEUROLOGIC:  Intact.  CT scan with contrast of the chest shows a normal thoracic aorta without evidence of hematoma,  false lumen, or any residual changes from the previous type B dissection.  The aorta is of normal diameter as well.  However, the left breast on the study is noted to have a 1 x 2 cm nodular density.  This was not mentioned on the last CT scan of the chest performed December 2011.  The patient is scheduled to see her oncologist Dr. Amie Critchley at [facility] with a mammogram in September.  IMPRESSION AND PLAN:  The patient's thoracic aortic disease is now completely resolved and she is on the correct medications to prevent further recurrence.  She will follow up with her cardiologist and return here as needed.  Kerin Perna, M.D. Electronically Signed  PV/MEDQ  D:  05/13/2011  T:  05/14/2011  Job:  [id number]  cc:   Feliciana Rossetti, MD Verne Carrow, MD Dossie Der, MD

## 2011-05-24 NOTE — Telephone Encounter (Signed)
Pt needs her coreg to be call in to [facility] # [phone]

## 2011-05-25 ENCOUNTER — Telehealth: Payer: Self-pay | Admitting: Cardiology

## 2011-05-25 DIAGNOSIS — I429 Cardiomyopathy, unspecified: Secondary | ICD-10-CM

## 2011-05-25 MED ORDER — CARVEDILOL 3.125 MG PO TABS: 3.1250 mg | ORAL_TABLET | Freq: Two times a day (BID) | ORAL | Status: DC

## 2011-05-25 NOTE — Telephone Encounter (Signed)
Prescription refilled.

## 2011-05-25 NOTE — Telephone Encounter (Signed)
Order put in for referral to see one of our EP doctors for discussion of an ICD.

## 2011-06-27 ENCOUNTER — Ambulatory Visit (INDEPENDENT_AMBULATORY_CARE_PROVIDER_SITE_OTHER): Payer: PRIVATE HEALTH INSURANCE | Admitting: Internal Medicine

## 2011-06-27 ENCOUNTER — Encounter: Payer: Self-pay | Admitting: *Deleted

## 2011-06-27 ENCOUNTER — Encounter: Payer: Self-pay | Admitting: Internal Medicine

## 2011-06-27 DIAGNOSIS — I519 Heart disease, unspecified: Secondary | ICD-10-CM

## 2011-06-27 DIAGNOSIS — I10 Essential (primary) hypertension: Secondary | ICD-10-CM

## 2011-06-27 DIAGNOSIS — I447 Left bundle-branch block, unspecified: Secondary | ICD-10-CM

## 2011-06-27 NOTE — Assessment & Plan Note (Signed)
As above.

## 2011-06-27 NOTE — Progress Notes (Signed)
[name] is a pleasant 60 y.o. WF patient with a h/o nonischemic CM (EF 15-20%), LBBB, and chronic systolic dysfunction who presents today for EP consultation regarding risks for sudden death.  She was diagnoses with R sided breast cancer 2009.  She underwent lumpectomy, XRT, and chemotherapy at that time.  In February 2010 she was admitted with abdominal pain. She was found to have LBBB and a Type B aortic dissection which was managed conservatively at the time. She had no other cardiac issues. Her aortic dissection has healed. She has followed with Dr.  Morton Peters for her aortic dissection. She  was admitted to [facility] on December 5th, 2011 with volume overload and pulmonary edema. Echo showed EF of 25%. Cardiac cath on 10/06/10 with no evidence of coronary artery disease. It was felt that this may be related to her chemotherapy. She had a DVT in her left leg December 2011 and was treated with coumadin for 6 months. She has had no pain or swelling in her left leg. She has been feeling well.   She reports doing reasonably well at this time.  She reports dypsnea with moderate activity.  She reports becoming SOB with ambulation 1/4 mile.  She reports dypsnea with 2 flights of stairs.  She has stable mild edema. Today, she denies symptoms of palpitations, chest pain,  orthopnea, PND, dizziness, presyncope, syncope, or neurologic sequela. The patient is tolerating medications without difficulties and is otherwise without complaint today.   Past Medical History  Diagnosis Date  . Nonischemic cardiomyopathy     Admission 12/11 with EF 25%, normal coronary arteries by cath 12/11; NICM presumed to be secondary to chemotherapy for breast cancer  . DVT (deep venous thrombosis) 10/18/2010  . Obesity   . 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 Hypertension   . Glaucoma   . Depression   . Breast cancer 07/2009    Stage 3 lumpectomy, radiation, chemotherapy; finished chemo October, felt to be in  remission  . History of shingles   . Hyperlipidemia   . Chronic systolic dysfunction of left ventricle   . Left bundle branch block    Past Surgical History  Procedure Date  . Tubal ligation     Bilateral  . Oophorectomy     Single  . Breast lumpectomy     Right breast  . Tonsillectomy     Current Outpatient Prescriptions  Medication Sig Dispense Refill  . albuterol (PROVENTIL) (2.5 MG/3ML) 0.083% nebulizer solution Take 2.5 mg by nebulization as needed.        . ALPRAZolam (XANAX) 1 MG tablet Take 1 mg by mouth as needed.        [name] aspirin 81 MG EC tablet Take 81 mg by mouth daily.        . benzonatate (TESSALON) 200 MG capsule Take 200 mg by mouth as needed.        . Bepotastine Besilate (BEPREVE) 1.5 % SOLN Apply to eye as needed.        . bimatoprost (LUMIGAN) 0.03 % ophthalmic solution Place 1 drop into both eyes at bedtime.        . Calcium Carbonate-Vitamin D (CALCIUM-VITAMIN D3) 600-200 MG-UNIT TABS Take 1 tablet by mouth 2 (two) times daily.        . carvedilol (COREG) 3.125 MG tablet Take 1 tablet (3.125 mg total) by mouth 2 (two) times daily with a meal.  60 tablet  12  . fexofenadine (  ALLEGRA) 180 MG tablet Take 180 mg by mouth as needed.        . fluticasone (FLONASE) 50 MCG/ACT nasal spray Place 2 sprays into the nose as needed.        . furosemide (LASIX) 40 MG tablet Take 1 tablet (40 mg total) by mouth 2 (two) times daily.  180 tablet  3  . HYDROcodone-acetaminophen (NORCO) 5-325 MG per tablet Take 1 tablet by mouth as needed.        [name] letrozole (FEMARA) 2.5 MG tablet Take 2.5 mg by mouth every evening.        . montelukast (SINGULAIR) 10 MG tablet Take 10 mg by mouth as needed.        . Multiple Vitamin (MULTIVITAMIN) tablet Take 1 tablet by mouth daily.        . niacin (NIASPAN) 1000 MG CR tablet Take 1,000 mg by mouth at bedtime.        [name] 20 MG capsule Take 20 mg by mouth 2 (two) times daily.        [name] PARoxetine (PAXIL) 10 MG tablet Take 10  mg by mouth every morning.        . potassium chloride (MICRO-K) 10 MEQ CR capsule Take 10 mEq by mouth 2 (two) times daily.        . ranitidine (ZANTAC) 300 MG capsule Take 300 mg by mouth at bedtime.        . simvastatin (ZOCOR) 20 MG tablet Take 20 mg by mouth at bedtime.        . valsartan (DIOVAN) 160 MG tablet Take 1 tablet (160 mg total) by mouth daily.  30 tablet  11    No Known Allergies  History   Social History  . Marital Status: Married    Spouse Name: N/A    Number of Children: N/A  . Years of Education: N/A   Occupational History  . [employer]  . Retired from Photographer    Social History Main Topics  . Smoking status: Never Smoker   . Smokeless tobacco: Not on file  . Alcohol Use: No  . Drug Use: No  . Sexually Active: Not on file   Other Topics Concern  . Not on file   Social History Narrative   MarriedOne childLives in [place] with spouse and mother in lawPreviously worked as a Public librarian for [employer]    Family History  Problem Relation Age of Onset  . Hypertension Mother   . Hypertension Father   . Hypertension Brother   . Hypertension Maternal Grandfather   . Heart attack Maternal Grandfather     MI  . Coronary artery disease Maternal Grandfather   . Hypertension Paternal Grandfather   . Heart attack Paternal Grandfather     MI  . Coronary artery disease Paternal Grandfather   . Hypertension Brother     ROS- All systems are reviewed and negative except as per the HPI above  Physical Exam: Filed Vitals:   06/27/11 1510  BP: 126/86  Pulse: 88  Height: 5' 2.5" (1.588 m)  Weight: 243 lb (110.224 kg)    GEN- The patient is overweight appearing, alert and oriented x 3 today.   Head- normocephalic, atraumatic Eyes-  Sclera clear, conjunctiva pink Ears- hearing intact Oropharynx- clear Neck- supple, no JVP Lymph- no cervical lymphadenopathy Lungs- Clear to ausculation bilaterally, normal work of  breathing Heart- Regular rate and rhythm, no murmurs, rubs or gallops,  PMI not laterally displaced GI- soft, NT, ND, + BS Extremities- no clubbing, cyanosis, 1+ edema MS- no significant deformity or atrophy Skin- no rash or lesion Psych- euthymic mood, full affect Neuro- strength and sensation are intact  EKG 05/12/11 reveals sinus rhythm 80 bpm, left bundle branch block (QRS 172) Echo 05/20/11 reveals severe global dysfunction EF 15-20%, LVEDD 60  Assessment and Plan:

## 2011-06-27 NOTE — Assessment & Plan Note (Signed)
The patient has a nonischemic CM (EF 15-20%), NYHA Class II/III CHF, and LBBB despite optimal medical therapy.  Her cardiomyopathy is likely due to prior chemotherapy.  Her breast cancer is in remission.  At this time, she meets SCD-HeFT criteria for ICD implantation for primary prevention of sudden death. She has a left bundle branch block and therefore may also benefit from cardiac resynchronization therapy.  Risks, benefits, alternatives to Biventricular ICD implantation were discussed in detail with the patient today. The patient  understands that the risks include but are not limited to bleeding, infection, pneumothorax, perforation, tamponade, vascular damage, renal failure, MI, stroke, death, inappropriate shocks, and lead dislodgement and wishes to proceed.  We will therefore schedule device implantation at the next available time.  I would plan to use a St Jude system with quadripolar LV lead which will allow for optimal resynchronization.   She reports having difficult IJ catheter access placement during her hospitalization in 2010.  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  She will therefore likely require venogram at the beginning of her procedure.

## 2011-06-27 NOTE — Patient Instructions (Signed)

## 2011-06-27 NOTE — Assessment & Plan Note (Signed)
Stable No change required today  

## 2011-06-29 ENCOUNTER — Other Ambulatory Visit: Payer: Self-pay | Admitting: *Deleted

## 2011-06-29 MED ORDER — POTASSIUM CHLORIDE ER 10 MEQ PO CPCR: 10.0000 meq | ORAL_CAPSULE | Freq: Two times a day (BID) | ORAL | Status: DC

## 2011-07-08 ENCOUNTER — Telehealth: Payer: Self-pay | Admitting: Internal Medicine

## 2011-07-08 ENCOUNTER — Ambulatory Visit (INDEPENDENT_AMBULATORY_CARE_PROVIDER_SITE_OTHER): Payer: PRIVATE HEALTH INSURANCE | Admitting: *Deleted

## 2011-07-08 DIAGNOSIS — I7100 Dissection of unspecified site of aorta: Secondary | ICD-10-CM

## 2011-07-08 DIAGNOSIS — I447 Left bundle-branch block, unspecified: Secondary | ICD-10-CM

## 2011-07-08 DIAGNOSIS — I10 Essential (primary) hypertension: Secondary | ICD-10-CM

## 2011-07-08 LAB — CBC WITH DIFFERENTIAL/PLATELET
Eosinophils Relative: 4.1 % (ref 0.0–5.0)
HCT: 37 % (ref 36.0–46.0)
Lymphs Abs: 0.7 10*3/uL (ref 0.7–4.0)
MCHC: 33.8 g/dL (ref 30.0–36.0)
MCV: 91.6 fl (ref 78.0–100.0)
Monocytes Absolute: 0.4 10*3/uL (ref 0.1–1.0)
Neutrophils Relative %: 63.5 % (ref 43.0–77.0)
Platelets: 121 10*3/uL — ABNORMAL LOW (ref 150.0–400.0)
RDW: 13.6 % (ref 11.5–14.6)
WBC: 3.4 10*3/uL — ABNORMAL LOW (ref 4.5–10.5)

## 2011-07-08 LAB — BASIC METABOLIC PANEL
BUN: 18 mg/dL (ref 6–23)
CO2: 28 mEq/L (ref 19–32)
Calcium: 9.3 mg/dL (ref 8.4–10.5)
Creatinine, Ser: 0.8 mg/dL (ref 0.4–1.2)
GFR: 77.61 mL/min (ref >60.00)
Glucose, Bld: 85 mg/dL (ref 70–99)
Sodium: 143 mEq/L (ref 135–145)

## 2011-07-08 NOTE — Telephone Encounter (Signed)
Patient calling  surgery on next Friday. General information pass along to nurse.  Pt is O.K. With paper tape. Need to use left arm for iv. H/o breast ca. Valve in left neck goes to opposite direction.

## 2011-07-11 ENCOUNTER — Encounter: Payer: Self-pay | Admitting: *Deleted

## 2011-07-15 ENCOUNTER — Ambulatory Visit (HOSPITAL_COMMUNITY)
Admission: RE | Admit: 2011-07-15 | Discharge: 2011-07-16 | Disposition: A | Discharge status: Home / Self Care | Payer: PRIVATE HEALTH INSURANCE | Source: Ambulatory Visit | Attending: Internal Medicine | Admitting: Internal Medicine

## 2011-07-15 DIAGNOSIS — I428 Other cardiomyopathies: Secondary | ICD-10-CM | POA: Insufficient documentation

## 2011-07-15 DIAGNOSIS — Z86718 Personal history of other venous thrombosis and embolism: Secondary | ICD-10-CM | POA: Insufficient documentation

## 2011-07-15 DIAGNOSIS — I7100 Dissection of unspecified site of aorta: Secondary | ICD-10-CM | POA: Insufficient documentation

## 2011-07-15 DIAGNOSIS — I447 Left bundle-branch block, unspecified: Secondary | ICD-10-CM | POA: Insufficient documentation

## 2011-07-15 DIAGNOSIS — Z79899 Other long term (current) drug therapy: Secondary | ICD-10-CM | POA: Insufficient documentation

## 2011-07-15 DIAGNOSIS — I509 Heart failure, unspecified: Secondary | ICD-10-CM | POA: Insufficient documentation

## 2011-07-15 DIAGNOSIS — I10 Essential (primary) hypertension: Secondary | ICD-10-CM | POA: Insufficient documentation

## 2011-07-15 DIAGNOSIS — Z923 Personal history of irradiation: Secondary | ICD-10-CM | POA: Insufficient documentation

## 2011-07-15 DIAGNOSIS — Z853 Personal history of malignant neoplasm of breast: Secondary | ICD-10-CM | POA: Insufficient documentation

## 2011-07-15 DIAGNOSIS — I5022 Chronic systolic (congestive) heart failure: Secondary | ICD-10-CM | POA: Insufficient documentation

## 2011-07-16 ENCOUNTER — Ambulatory Visit (HOSPITAL_COMMUNITY): Payer: PRIVATE HEALTH INSURANCE

## 2011-07-19 ENCOUNTER — Encounter: Payer: Self-pay | Admitting: Internal Medicine

## 2011-07-19 NOTE — Telephone Encounter (Signed)
Error

## 2011-07-25 ENCOUNTER — Encounter: Payer: Self-pay | Admitting: Internal Medicine

## 2011-07-25 ENCOUNTER — Ambulatory Visit (INDEPENDENT_AMBULATORY_CARE_PROVIDER_SITE_OTHER): Payer: PRIVATE HEALTH INSURANCE | Admitting: *Deleted

## 2011-07-25 ENCOUNTER — Other Ambulatory Visit: Payer: Self-pay

## 2011-07-25 DIAGNOSIS — I428 Other cardiomyopathies: Secondary | ICD-10-CM

## 2011-07-25 LAB — ICD DEVICE OBSERVATION
AL IMPEDENCE ICD: 425 Ohm
ATRIAL PACING ICD: 0 pct
FVT: 0
RV LEAD IMPEDENCE ICD: 450 Ohm
RV LEAD THRESHOLD: 0.75 V
TOT-0007: 1
TOT-0008: 0
TOT-0009: 1
TOT-0010: 1

## 2011-07-25 NOTE — Progress Notes (Signed)
Wound check-ICD 

## 2011-07-28 ENCOUNTER — Telehealth: Payer: Self-pay | Admitting: Internal Medicine

## 2011-07-28 NOTE — Telephone Encounter (Signed)
Pt wants to ask questions about since having surgery please call

## 2011-07-28 NOTE — Telephone Encounter (Signed)
Returned call to patient ---She is having some pain in back of shoulder and down her arm to her elbow on the underside and from elbow down on top of her arm  She woke up this morning and has no pain  This is the first time she has slept through the night since generator change  She has had a hard time because she is use to sleeping on her stomach and has had to sleep on her back.  She also is questioning something about her pacer portion of the ICD---saying it was not done due to veins being to small for wire and was to come back in 6 weeks for a follow up  After reviewing the discharge summary I see that the LV lead could not be placed and she is to come back and talk to Dr Johney Frame about having the an epicardial  Lead placed I have called patient back and explained this to her and let her know i will ask Dr Johney Frame how soon she needs to return for discussion  Will scheduled appointment for her after discussing with Dr Johney Frame

## 2011-07-31 NOTE — Telephone Encounter (Signed)
She should see me 6 weeks post implant.

## 2011-08-01 ENCOUNTER — Encounter: Payer: Self-pay | Admitting: *Deleted

## 2011-08-01 NOTE — Telephone Encounter (Signed)
Has an appointment with Dr Johney Frame on 08/31/11

## 2011-08-04 NOTE — Op Note (Signed)
[name], [name] NO.:  [id number]  MEDICAL RECORD NO.:  [id number]  LOCATION:                                 FACILITY:  PHYSICIAN:  Hillis Range, MD       DATE OF BIRTH:  [birth year]  DATE OF PROCEDURE: DATE OF DISCHARGE:                              OPERATIVE REPORT   SURGEON:  Hillis Range, MD  PREPROCEDURE DIAGNOSES: 1. Nonischemic cardiomyopathy. 2. Chronic New York Heart Association class 3 congestive heart     failure. 3. Left bundle-branch block.  POSTPROCEDURE DIAGNOSES: 1. Nonischemic cardiomyopathy. 2. Chronic New York Heart Association class 3 congestive heart     failure. 3. Left bundle-branch block.  PROCEDURES: 1. Implantable cardioverter-defibrillator implantation with left     ventricular lead placement attempted. 2. Left upper extremity venography. 3. Coronary sinus venography. 4. Defibrillation threshold testing.  INTRODUCTION:  [name] is a pleasant 60 year old female with a nonischemic cardiomyopathy (ejection fraction 15%), left bundle-branch block, and chronic New York Heart Association class 3 congestive heart failure who presents today for ICD implantation for primary prevention of sudden cardiac death.  She has a history of breast cancer for which she previously underwent chemotherapy.  She is felt to have a cardiomyopathy likely due to her prior chemotherapy.  She has been treated with an optimal medical regimen, however, her ejection fraction remains depressed.  She therefore presents today for biventricular ICD implantation.  DESCRIPTION OF PROCEDURE:  Informed written consent was obtained and the patient was brought to the Electrophysiology Lab in the fasting state. She was adequately sedated with intravenous medications as outlined in the nursing report.  The patient's left chest was prepped and draped in the usual sterile fashion by the EP Lab staff.  The skin overlying the left deltopectoral region was  infiltrated with lidocaine for local analgesia.  A 5-cm incision was made over the left deltopectoral region. A left subcutaneous ICD pocket was fashioned using a combination of sharp and blunt dissection.  Electrocautery was required to assure hemostasis.  A venogram of the left upper extremity was performed, which revealed a moderate-sized left axillary vein which emptied into a moderate-sized left subclavian vein.  The left axillary vein was therefore cannulated with fluoroscopic visualization.  Through the left axillary vein, a St. Jude Medical Tendril model [phone] (serial number [id number]) right atrial lead and a St. Jude Medical Durata model [phone] (serial number [id number]) right ventricular lead were advanced with fluoroscopic visualization into the right atrial appendage and right ventricular apex positions respectively.  In this location, atrial lead P-waves measured 1.9 mV with impedance of 449 ohms and a threshold of 0.5 volts at 0.5 milliseconds.  Right ventricular lead R-waves measured 8 mV with impedance of 679 ohms and a threshold of 0.6 volts at 0.5 milliseconds.  Both leads were secured to the pectoralis fascia using #2 silk suture over the suture sleeves.  A Medtronic MB2 guide was then introduced through the left axillary vein into the low right atrium.  A curved hexapolar Damato catheter was introduced through the MB2 guide and advanced into the body of the coronary sinus.  The MB2 guide was then  advanced over the catheter into the body of the coronary sinus. The Damato catheter was then removed.  Through the MB2 guide, an occlusive balloon was advanced into the proximal body of the coronary sinus.  A venogram was performed which revealed a moderate-sized body to the coronary sinus.  There was a distal posterolateral branch which had a very precipitous downsloping takeoff and quickly branched into an upper and lower posterolateral branch.  No other suitable  branches were identified.  A Whisper CSJ wire was therefore introduced through the MB2 guide in place of the angiographic balloon and advanced into the lower posterolateral branch.  A St. Jude Medical Quartet lead model [id number] (serial number [id number]) was advanced over the Whisper CSJ wire into the ostium of the posterolateral branch.  Unfortunately, the lead could not be advanced any further.  The whisper CSJ wire was exchanged for a Mailman wire which was advanced quite far distally with good support. Again, the lead could not be advanced into this posterolateral branch. The lead was therefore removed and a Medtronic Attain Select II guide was advanced over the Bancroft wire, but could not be advanced into the posterolateral branch.  The upper posterolateral branch could also not be engaged with the Attain Select II.  The Attain Select II was therefore exchanged for a 3-French subselective catheter to see if this might actually track into the vein.  Unfortunately, due to the very small caliber of the posterolateral branches, the 3-French subselective catheter could not be advanced past the ostium of the posterolateral branch into either the upper or lower branches.  A Wholey wire was therefore placed into the body of the coronary sinus in place of the subselective guide.  The MB2 guide was pulled back into the ostium of the coronary sinus.  Two additional subselective coronary sinus angiograms were performed which revealed only the posterolateral branches which were again noted to be very small.  There were no other branches observed along the course of the coronary sinus.  It was therefore felt that the patient's small coronary sinus branches were not amenable to placement of a left ventricular lead today.  I therefore decided to go ahead and place a biventricular ICD device and I would discuss the option of epicardial left ventricular lead placement by one of our thoracic surgeons if  the patient desired in the future.  The MB2 guide was therefore removed and hemostasis was assured with manual pressure.  The right atrial and right ventricular leads were then connected to a St. Jude Medical Unify Assura model [id number] CRT-D device (serial number [id number]).  A port plug was placed into the LV portion of the device header.  The device was then secured to the pectoralis fascia using a single #2 silk.  The pocket was irrigated with copious gentamicin solution.  The pocket was then closed in 2 layers with 2.0 Vicryl suture for the subcutaneous and subcuticular layers.  Steri- Strips and a sterile dressing were then applied.  Ventricular defibrillation threshold testing was then performed.  Ventricular fibrillation was induced with a T-wave shock.  Adequate sensing of ventricular fibrillation with no dropout was observed with a programed sensitivity of 1.0 mV.  The patient was successfully defibrillated to sinus rhythm with a single 15-joule shock delivered from the device with an impedance of 62 ohms and a duration of 4 seconds.  The patient remained in sinus rhythm thereafter.  There were no early apparent complications.  CONCLUSIONS: 1. Successful implantation of a St. Jude  Medical ICD for primary     prevention of sudden cardiac death. 2. Left ventricular lead placement was unsuccessful due to the very     small posterolateral branches which were not amenable to lead     placement.  I placed a biventricular ICD device with plans to     potentially have the patient     return electively for epicardial left ventricular lead placement. 3. DFT less than or equal to 15 joules. 4. No early apparent complications.     Hillis Range, MD     JA/MEDQ  D:  07/15/2011  T:  07/15/2011  Job:  [id number]  cc:   Feliciana Rossetti, MD Verne Carrow, MD  Electronically Signed by Hillis Range MD on 08/04/2011 08:44:00 AM

## 2011-08-10 NOTE — Discharge Summary (Signed)
[name], [name] NO.:  [id number]  MEDICAL RECORD NO.:  [id number]  LOCATION:  2013                         FACILITY:  [facility]  PHYSICIAN:  [name], M.D. DATE OF BIRTH:  Aug 05, 1951  DATE OF ADMISSION:  07/15/2011 DATE OF DISCHARGE:  07/16/2011                              DISCHARGE SUMMARY   PROCEDURES: 1. Left upper extremity venography. 2. Coronary sinus venography. 3. Implantation of St. Jude Unify Assura cardioverter defibrillator     implantation. 4. Defibrillation threshold testing. 5. Two-view chest x-ray  PRIMARY FINAL DISCHARGE DIAGNOSES: 1. Nonischemic cardiomyopathy with an ejection fraction of 15-20% by     echocardiogram in July 2012. 2. Chronic systolic congestive heart failure. 3. Left bundle-branch block. 4. History of deep vein thrombosis. 5. Morbid obesity. 6. Type B aortic dissection. 7. Hypertension. 8. Glaucoma. 9. Depression. 10.History of breast cancer, status post lumpectomy, radiation, and     chemo. 11.History of herpes zoster. 12.Hyperlipidemia. 13.Status post bilateral tubal ligation and oophorectomy as well as     tonsillectomy. 14.Status post cardiac catheterization in December 2011 showing no     significant coronary artery disease.  TIME AT DISCHARGE:  41 minutes.  HOSPITAL COURSE:  [name] is a 60 year old female with a history of nonischemic cardiomyopathy.  She was evaluated by Dr. Johney Davis for cardiomyopathy, likely due to prior chemotherapy and met the criteria for ICD implantation for primary prevention.  It was also felt that biventricular device would benefit her because she has an underlying left bundle-branch block.  She was admitted for the procedure on July 15, 2011.  She had successful implantation of the St. Jude ICD, but left ventricular lead placement was unsuccessful due to very small posterolateral branches which were not amenable to lead placement.  A biventricular device  was placed so that the patient could return electively for epicardial left ventricular lead placement at a later date.  There were no early complications.  On July 16, 2011, her chest x-ray showed placement without complication and stable cardiomegaly.  She was ambulating without chest pain or shortness of breath and considered stable for discharge, to follow up as an outpatient.  DISCHARGE INSTRUCTIONS: 1. Her activity level is to be increased gradually per the discharge     instruction sheet. 2. She is to call us for any problems with her incision. 3. She is to follow up with wound check and with Dr. Johney Davis and our     office will call her with these appointments. 4. She is to follow up with Dr. Shary Davis as needed. 5. She is to weigh herself daily.  DISCHARGE MEDICATIONS: 1. Valsartan 160 mg daily. 2. Paxil 10 mg daily. 3. Niaspan 1000 mg nightly. 4. Letrozole 2.5 mg nightly. 5. Benzonatate 100 mg p.r.n. daily. 6. Xanax 1 mg q.8 h. p.r.n. 7. Coreg 3.125 mg b.i.d. 8. Colace 100 mg daily. 9. Flonase 2 sprays daily. 10.Zocor 20 mg nightly. 11.Ranitidine 300 mg nightly. 12.Singulair 10 mg daily p.r.n. 13.Lasix 40 mg b.i.d. 14.Multivitamins daily. 15.Norco 5/325 one tablet q.6 h. p.r.n. 16.Aspirin 81 mg a day. 17.Bimatoprost ophthalmic solution nightly, 18.Omeprazole 20 mg b.i.d. 19.Calcium plus D b.i.d. 20.Potassium 10 mEq b.i.d.  21.Fexofenadine 180 mg a day. 22.Albuterol q.4 h. p.r.n. 23.Bepreve eye drops daily p.r.n.     Brittney Demark, PA-C   ______________________________ [name], M.D.    RB/MEDQ  D:  07/16/2011  T:  07/16/2011  Job:  [id number]  cc:   Brittney Rossetti, MD  Electronically Signed by Brittney Demark PA-C on 08/08/2011 06:37:56 AM Electronically Signed by [name] M.D. on 08/10/2011 09:00:44 AM

## 2011-08-17 ENCOUNTER — Ambulatory Visit (INDEPENDENT_AMBULATORY_CARE_PROVIDER_SITE_OTHER): Payer: PRIVATE HEALTH INSURANCE | Admitting: Physician Assistant

## 2011-08-17 ENCOUNTER — Telehealth: Payer: Self-pay | Admitting: Cardiovascular Disease

## 2011-08-17 ENCOUNTER — Encounter: Payer: Self-pay | Admitting: Physician Assistant

## 2011-08-17 ENCOUNTER — Ambulatory Visit (INDEPENDENT_AMBULATORY_CARE_PROVIDER_SITE_OTHER)
Admission: RE | Admit: 2011-08-17 | Discharge: 2011-08-17 | Disposition: A | Discharge status: Home / Self Care | Payer: PRIVATE HEALTH INSURANCE | Source: Ambulatory Visit | Attending: Internal Medicine | Admitting: Internal Medicine

## 2011-08-17 VITALS — BP 116/62 | HR 70 | Resp 18 | Ht 62.0 in | Wt 236.8 lb

## 2011-08-17 DIAGNOSIS — R0600 Dyspnea, unspecified: Secondary | ICD-10-CM | POA: Insufficient documentation

## 2011-08-17 DIAGNOSIS — I429 Cardiomyopathy, unspecified: Secondary | ICD-10-CM

## 2011-08-17 DIAGNOSIS — R0609 Other forms of dyspnea: Secondary | ICD-10-CM

## 2011-08-17 DIAGNOSIS — R5383 Other fatigue: Secondary | ICD-10-CM

## 2011-08-17 DIAGNOSIS — R5381 Other malaise: Secondary | ICD-10-CM

## 2011-08-17 DIAGNOSIS — F329 Major depressive disorder, single episode, unspecified: Secondary | ICD-10-CM

## 2011-08-17 DIAGNOSIS — I10 Essential (primary) hypertension: Secondary | ICD-10-CM

## 2011-08-17 DIAGNOSIS — R0989 Other specified symptoms and signs involving the circulatory and respiratory systems: Secondary | ICD-10-CM

## 2011-08-17 DIAGNOSIS — I5022 Chronic systolic (congestive) heart failure: Secondary | ICD-10-CM

## 2011-08-17 DIAGNOSIS — F3289 Other specified depressive episodes: Secondary | ICD-10-CM

## 2011-08-17 LAB — BASIC METABOLIC PANEL
CO2: 27 mEq/L (ref 19–32)
Chloride: 104 mEq/L (ref 96–112)
Potassium: 3.6 mEq/L (ref 3.5–5.1)
Sodium: 138 mEq/L (ref 135–145)

## 2011-08-17 LAB — D-DIMER, QUANTITATIVE: D-Dimer, Quant: 7.39 ug/mL-FEU — ABNORMAL HIGH (ref 0.00–0.48)

## 2011-08-17 MED ORDER — IOHEXOL 300 MG/ML  SOLN
80.0000 mL | Freq: Once | INTRAMUSCULAR | Status: AC | PRN
  Administered 2011-08-17: 80 mL via INTRAVENOUS

## 2011-08-17 MED ORDER — CARVEDILOL 3.125 MG PO TABS: ORAL_TABLET | ORAL | Status: DC

## 2011-08-17 NOTE — Progress Notes (Signed)
Addended by: Tarri Fuller on: 08/17/2011 02:19 PM   Modules accepted: Orders

## 2011-08-17 NOTE — Patient Instructions (Signed)
Your physician recommends that you schedule a follow-up appointment in: 08/31/11 @ 9:45 TO SEE DR. ALLRED  Your physician recommends that you return for lab work in: TODAY BMET, BNP, D-DIMER 428.22 HEART FAILURE  Your physician has recommended you make the following change in your medication: INCREASE COREG TO 6.25 MG IN THE AM THIS WILL BE 2 TABLETS OF THE 3.125 AND TAKE 3.125 MG IN THE PM

## 2011-08-17 NOTE — Assessment & Plan Note (Signed)
Mild.  Check a BNP and DDimer as noted.  ? If multifactorial = severe cardiomyopathy, deconditioning, depression, etc.

## 2011-08-17 NOTE — Telephone Encounter (Signed)
Pt calling back wanting to know why pt blood work levels were high. Pt also wanted to know if it was ok for pt to go to the mountains. Please return pt call to discuss further.

## 2011-08-17 NOTE — Assessment & Plan Note (Signed)
Controlled.  

## 2011-08-17 NOTE — Assessment & Plan Note (Signed)
Check BNP.  Adjust diuretics if needed.  Follow up with Dr. Johney Frame in a couple weeks as scheduled.  Suspect she will feel better with BiV pacer.  Apparently, she is to eventually get set up to see [facility]CTS to help with placement of LV lead.

## 2011-08-17 NOTE — Assessment & Plan Note (Signed)
Etiology not clear.  This may be multifactorial.  We interrogated her device.  Her impedance does not reflect fluid overload, however her data is somewhat limited.  Her exam does not point towards fluid overload as well.  Question if her cardiomyopathy is making her feel fatigued.  I will have her undergo lab work to include a basic metabolic panel and BNP.  If her BNP is elevated, I will adjust her diuretic.  Her baseline heart rate is also 90.  I will try to adjust her carvedilol to 6.25 mg in the morning and 3.125 mg in the evening to see if a lower resting heart rate alleviates some of her fatigue.  She has a h/o malignancy as well as prior DVT.  I have a low suspicion for pulmonary embolus.  I will check a DDimer to rule out this possibility.

## 2011-08-17 NOTE — Assessment & Plan Note (Signed)
Proceed with workup as noted.  Consider increasing Paxil.

## 2011-08-17 NOTE — Telephone Encounter (Signed)
S/w pt about why her d-dimer results were high as per Tereso Newcomer, PA-C # elevated due to new defib just put in , but we did the CTA to r/o PE, pt gave verbal understanding to this . I also advised her as per Tereso Newcomer, PA-C that it was OK for her to go to the mountains this weekend. Pt was very happy about everything we do for her. I told her we were here to take care of her and make sure she is ok. Danielle Rankin

## 2011-08-17 NOTE — Progress Notes (Signed)
History of Present Illness: Primary Cardiologist:  Dr. Verne Carrow Primary Electrophysiologist:  Dr. Hillis Range  [name] is a 60 y.o. female with a h/o nonischemic CM (EF 15-20%), LBBB, and chronic systolic CHF.  She was diagnosed with R sided breast cancer 2009.  She underwent lumpectomy, XRT, and chemotherapy at that time.  In February 2010 she was admitted with abdominal pain. She was found to have LBBB and a Type B aortic dissection which was managed conservatively at that time. She had no other cardiac issues. Her aortic dissection has healed. She has followed with Dr.  Morton Peters for her aortic dissection. She  was admitted to [facility] on December 5th, 2011 with volume overload and pulmonary edema. Echo showed EF of 25%. Cardiac cath on 10/06/10 with no evidence of coronary artery disease.   NICM was felt to be related to her chemotherapy.  She had a DVT in her left leg December 2011 and was treated with coumadin for 6 months.   She saw Dr. Johney Frame in 8/12 to discuss ICD therapy.  She underwent St. Jude ICD implant in 9/12.  LV lead placement was unsuccessful.  BiV-ICD box was implanted in hopes that LV lead placement could be re-attempted.  She presents today with feelings of fatigue.  She admits to a high salt meal one day last week.  The next day, while walking into the court house to perform a swearing in, she became diaphoretic and was short of breath.  She used her inhaler and rested and felt better.  No syncope.  No orthopnea, PND.  Her edema is overall improved.  She notes some DOE.  She is probably class 2 at this time.  No chest pain.  No pleuritic chest pain.  No hemoptysis.  No prolonged travels.  She was only in the hospital in 9/12 for 24 hours.  No ICD Rx.  Weights are down.  Appetite is ok.  She wonders if she is more depressed or just expecting too much too soon.     Past Medical History  Diagnosis Date  . Nonischemic cardiomyopathy     Admission 12/11  with EF 25%, normal coronary arteries by cath 12/11; NICM presumed to be secondary to chemotherapy for breast cancer  . DVT (deep venous thrombosis) 10/18/2010  . Obesity   . 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 Hypertension   . Glaucoma   . Depression   . Breast cancer 07/2009    Stage 3 lumpectomy, radiation, chemotherapy; finished chemo October, felt to be in remission  . History of shingles   . Hyperlipidemia   . Chronic systolic dysfunction of left ventricle   . Left bundle branch block   . AICD (automatic cardioverter/defibrillator) present     St. Jude (JA) 9/12; LV lead placement unsuccessful    Current Outpatient Prescriptions  Medication Sig Dispense Refill  . albuterol (PROVENTIL) (2.5 MG/3ML) 0.083% nebulizer solution Take 2.5 mg by nebulization as needed.        . ALPRAZolam (XANAX) 1 MG tablet Take 1 mg by mouth as needed.        [name] aspirin 81 MG EC tablet Take 81 mg by mouth daily.        . benzonatate (TESSALON) 200 MG capsule Take 200 mg by mouth as needed.        . Bepotastine Besilate (BEPREVE) 1.5 % SOLN Apply to eye as needed.        [name]  bimatoprost (LUMIGAN) 0.03 % ophthalmic solution Place 1 drop into both eyes at bedtime.        . Calcium Carbonate-Vitamin D (CALCIUM-VITAMIN D3) 600-200 MG-UNIT TABS Take 1 tablet by mouth 2 (two) times daily.        . carvedilol (COREG) 3.125 MG tablet TAKE 2 TABLETS IN THE AM AND TAKE 1 TABLET IN THE PM  90 tablet  12  . fexofenadine (ALLEGRA) 180 MG tablet Take 180 mg by mouth as needed.        . fluticasone (FLONASE) 50 MCG/ACT nasal spray Place 2 sprays into the nose as needed.        . furosemide (LASIX) 40 MG tablet Take 1 tablet (40 mg total) by mouth 2 (two) times daily.  180 tablet  3  . HYDROcodone-acetaminophen (NORCO) 5-325 MG per tablet Take 1 tablet by mouth as needed.        [name] letrozole (FEMARA) 2.5 MG tablet Take 2.5 mg by mouth every evening.        . montelukast (SINGULAIR) 10 MG tablet Take 10 mg by mouth as  needed.        . Multiple Vitamin (MULTIVITAMIN) tablet Take 1 tablet by mouth daily.        . niacin (NIASPAN) 1000 MG CR tablet Take 1,000 mg by mouth at bedtime.        [name] 20 MG capsule Take 20 mg by mouth 2 (two) times daily.        [name] PARoxetine (PAXIL) 10 MG tablet Take 10 mg by mouth every morning.        . potassium chloride (MICRO-K) 10 MEQ CR capsule Take 1 capsule (10 mEq total) by mouth 2 (two) times daily.  60 capsule  6  . ranitidine (ZANTAC) 300 MG capsule Take 300 mg by mouth at bedtime.        . simvastatin (ZOCOR) 20 MG tablet Take 20 mg by mouth at bedtime.        . valsartan (DIOVAN) 160 MG tablet Take 1 tablet (160 mg total) by mouth daily.  30 tablet  11  . DISCONTD: carvedilol (COREG) 3.125 MG tablet Take 1 tablet (3.125 mg total) by mouth 2 (two) times daily with a meal.  60 tablet  12    Allergies: Allergies  Allergen Reactions  . Tape     Blister from clear tapes.    Social history:  Nonsmoker  ROS:  Please see the history of present illness.  All other systems reviewed and negative.   Vital Signs: BP 116/62  Pulse 70  Resp 18  Ht 5\' 2"  (1.575 m)  Wt 236 lb 12.8 oz (107.412 kg)  BMI 43.31 kg/m2  PHYSICAL EXAM: Well nourished, well developed, in no acute distress HEENT: normal Neck: no JVD Cardiac:  normal S1, S2; RRR; no murmur, No gallops Chest: ICD site well healed with one retained subcutaneous stitch Lungs:  clear to auscultation bilaterally, no wheezing, rhonchi or rales Abd: soft, nontender, no hepatomegaly Ext: no edema Skin: warm and dry Neuro:  CNs 2-12 intact, no focal abnormalities noted Psych: Affect somewhat blunted  EKG:  Sinus rhythm, heart rate 90, left bundle branch block  ASSESSMENT AND PLAN:

## 2011-08-18 ENCOUNTER — Telehealth: Payer: Self-pay | Admitting: *Deleted

## 2011-08-18 DIAGNOSIS — I429 Cardiomyopathy, unspecified: Secondary | ICD-10-CM

## 2011-08-18 NOTE — Telephone Encounter (Signed)
pt aware of lab results and repeat lab 08/26/11. Danielle Rankin

## 2011-08-25 ENCOUNTER — Encounter: Payer: Self-pay | Admitting: *Deleted

## 2011-08-26 ENCOUNTER — Other Ambulatory Visit: Payer: Self-pay | Admitting: *Deleted

## 2011-08-26 ENCOUNTER — Other Ambulatory Visit (INDEPENDENT_AMBULATORY_CARE_PROVIDER_SITE_OTHER): Payer: PRIVATE HEALTH INSURANCE | Admitting: *Deleted

## 2011-08-26 DIAGNOSIS — R609 Edema, unspecified: Secondary | ICD-10-CM

## 2011-08-26 DIAGNOSIS — I429 Cardiomyopathy, unspecified: Secondary | ICD-10-CM

## 2011-08-26 LAB — BASIC METABOLIC PANEL
Chloride: 105 mEq/L (ref 96–112)
GFR: 66.86 mL/min (ref >60.00)
Potassium: 3.5 mEq/L (ref 3.5–5.1)

## 2011-08-26 LAB — BRAIN NATRIURETIC PEPTIDE: Pro B Natriuretic peptide (BNP): 401 pg/mL — ABNORMAL HIGH (ref 0.0–100.0)

## 2011-08-26 MED ORDER — POTASSIUM CHLORIDE ER 10 MEQ PO CPCR: 20.0000 meq | ORAL_CAPSULE | Freq: Two times a day (BID) | ORAL | Status: DC

## 2011-08-26 MED ORDER — FUROSEMIDE 40 MG PO TABS: 60.0000 mg | ORAL_TABLET | Freq: Two times a day (BID) | ORAL | Status: DC

## 2011-08-26 NOTE — Telephone Encounter (Signed)
Pt aware of labs today and to increase lasix to 60 mg bid and to increase K+ to 20 meq bid. Repeat bmet  08/31/11 same day pt sees [employer]

## 2011-08-31 ENCOUNTER — Encounter: Payer: Self-pay | Admitting: Internal Medicine

## 2011-08-31 ENCOUNTER — Ambulatory Visit (INDEPENDENT_AMBULATORY_CARE_PROVIDER_SITE_OTHER): Payer: PRIVATE HEALTH INSURANCE | Admitting: Internal Medicine

## 2011-08-31 ENCOUNTER — Ambulatory Visit (INDEPENDENT_AMBULATORY_CARE_PROVIDER_SITE_OTHER): Payer: PRIVATE HEALTH INSURANCE | Admitting: *Deleted

## 2011-08-31 DIAGNOSIS — I429 Cardiomyopathy, unspecified: Secondary | ICD-10-CM

## 2011-08-31 DIAGNOSIS — I10 Essential (primary) hypertension: Secondary | ICD-10-CM

## 2011-08-31 DIAGNOSIS — I5022 Chronic systolic (congestive) heart failure: Secondary | ICD-10-CM

## 2011-08-31 LAB — ICD DEVICE OBSERVATION
AL IMPEDENCE ICD: 450 Ohm
DEV-0020ICD: NEGATIVE
DEVICE MODEL ICD: 7004282
FVT: 0
HV IMPEDENCE: 72 Ohm
MODE SWITCH EPISODES: 0
RV LEAD AMPLITUDE: 11.9 mv
RV LEAD THRESHOLD: 0.75 V
TOT-0009: 1
VENTRICULAR PACING ICD: 0.02 pct
VF: 0

## 2011-08-31 LAB — BASIC METABOLIC PANEL
BUN: 16 mg/dL (ref 6–23)
CO2: 28 mEq/L (ref 19–32)
Calcium: 9.6 mg/dL (ref 8.4–10.5)
Chloride: 104 mEq/L (ref 96–112)
Creatinine, Ser: 1.1 mg/dL (ref 0.4–1.2)

## 2011-08-31 NOTE — Patient Instructions (Addendum)
Needs to be referred to Dr Alla German for an Epicardial LV lead placement  Your physician recommends that you schedule a follow-up appointment in: 3 months with Dr Johney Frame

## 2011-09-01 ENCOUNTER — Telehealth: Payer: Self-pay | Admitting: Internal Medicine

## 2011-09-01 ENCOUNTER — Encounter: Payer: Self-pay | Admitting: Internal Medicine

## 2011-09-01 NOTE — Telephone Encounter (Signed)
New message  Pt was here yesterday and referral to Dr Morton Peters has not been sent. please fax to [phone]

## 2011-09-01 NOTE — Progress Notes (Signed)
[name] is a pleasant 60 y.o. 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  She was diagnoses with R sided breast cancer 2009.  She underwent lumpectomy, XRT, and chemotherapy at that time.  In February 2010 she was admitted with abdominal pain. She was found to have LBBB and a Type B aortic dissection which was managed conservatively at the time. She had no other cardiac issues. Her aortic dissection has healed. She has followed with Dr.  Morton Peters for her aortic dissection. She  was admitted to [facility] on December 5th, 2011 with volume overload and pulmonary edema. Echo showed EF of 25%. Cardiac cath on 10/06/10 with no evidence of coronary artery disease. It was felt that this may be related to her chemotherapy. She had a DVT in her left leg December 2011 and was treated with coumadin for 6 months. She has had no pain or swelling in her left leg. She has been feeling well.   She reports doing reasonably well at this time. She recently underwent ICD implantation by me.  Due to very small coronary sinus branches, an LV endocardial lead could not be successfully placed.  She continues to have fatigue.  She reports dypsnea with moderate activity.  She reports becoming SOB with ambulation 1/4 mile or with 2 flights of stairs.  She has stable mild edema. Today, she denies symptoms of palpitations, chest pain,  orthopnea, PND, dizziness, presyncope, syncope, or neurologic sequela. The patient is tolerating medications without difficulties and is otherwise without complaint today.   Past Medical History  Diagnosis Date  . Nonischemic cardiomyopathy     Admission 12/11 with EF 25%, normal coronary arteries by cath 12/11; NICM presumed to be secondary to chemotherapy for breast cancer  . DVT (deep venous thrombosis) 10/18/2010  . Obesity   . 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 Hypertension   . Glaucoma   .  Depression   . Breast cancer 07/2009    Stage 3 lumpectomy, radiation, chemotherapy; finished chemo October, felt to be in remission  . History of shingles   . Hyperlipidemia   . Chronic systolic dysfunction of left ventricle   . Left bundle branch block   . Cardiac defibrillator in place     St. Jude (JA) 9/12; LV lead placement unsuccessful   Past Surgical History  Procedure Date  . Tubal ligation     Bilateral  . Oophorectomy     Single  . Breast lumpectomy     Right breast  . Tonsillectomy   . Cardiac defibrillator placement     Current Outpatient Prescriptions  Medication Sig Dispense Refill  . albuterol (PROVENTIL) (2.5 MG/3ML) 0.083% nebulizer solution Take 2.5 mg by nebulization as needed.        . ALPRAZolam (XANAX) 1 MG tablet Take 1 mg by mouth as needed.        [name] aspirin 81 MG EC tablet Take 81 mg by mouth daily.        . Bepotastine Besilate (BEPREVE) 1.5 % SOLN Apply to eye as needed.        . bimatoprost (LUMIGAN) 0.03 % ophthalmic solution Place 1 drop into both eyes at bedtime.        . Calcium Carbonate-Vitamin D (CALCIUM-VITAMIN D3) 600-200 MG-UNIT TABS Take 1 tablet by mouth 2 (two) times daily.        . carvedilol (COREG)  3.125 MG tablet TAKE 2 TABLETS IN THE AM AND TAKE 1 TABLET IN THE PM  90 tablet  12  . fexofenadine (ALLEGRA) 180 MG tablet Take 180 mg by mouth as needed.        . fluticasone (FLONASE) 50 MCG/ACT nasal spray Place 2 sprays into the nose as needed.        . furosemide (LASIX) 40 MG tablet Take 1.5 tablets (60 mg total) by mouth 2 (two) times daily.  90 tablet  6  . letrozole (FEMARA) 2.5 MG tablet Take 2.5 mg by mouth every evening.        . montelukast (SINGULAIR) 10 MG tablet Take 10 mg by mouth as needed.        . Multiple Vitamin (MULTIVITAMIN) tablet Take 1 tablet by mouth daily.        . niacin (NIASPAN) 1000 MG CR tablet Take 1,000 mg by mouth at bedtime.        [name] 20 MG capsule Take 20 mg by mouth 2 (two) times  daily.        [name] PARoxetine (PAXIL) 10 MG tablet Take 10 mg by mouth every morning.        . potassium chloride (MICRO-K) 10 MEQ CR capsule Take 20 mEq by mouth 4 (four) times daily.        . ranitidine (ZANTAC) 300 MG capsule Take 300 mg by mouth at bedtime.        . simvastatin (ZOCOR) 20 MG tablet Take 20 mg by mouth at bedtime.        . valsartan (DIOVAN) 160 MG tablet Take 1 tablet (160 mg total) by mouth daily.  30 tablet  11    Allergies  Allergen Reactions  . Tape     Blister from clear tapes.    History   Social History  . Marital Status: Married    Spouse Name: N/A    Number of Children: N/A  . Years of Education: N/A   Occupational History  . [employer]  . Retired from Photographer    Social History Main Topics  . Smoking status: Never Smoker   . Smokeless tobacco: Not on file  . Alcohol Use: No  . Drug Use: No  . Sexually Active: Not on file   Other Topics Concern  . 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    Family History  Problem Relation Age of Onset  . Hypertension Mother   . Hypertension Father   . Hypertension Brother   . Hypertension Maternal Grandfather   . Heart attack Maternal Grandfather     MI  . Coronary artery disease Maternal Grandfather   . Hypertension Paternal Grandfather   . Heart attack Paternal Grandfather     MI  . Coronary artery disease Paternal Grandfather   . Hypertension Brother     ROS- All systems are reviewed and negative except as per the HPI above  Physical Exam: Filed Vitals:   08/31/11 1007  BP: 122/64  Pulse: 64  Height: 5\' 2"  (1.575 m)  Weight: 237 lb (107.502 kg)    GEN- The patient is overweight appearing, alert and oriented x 3 today.   Head- normocephalic, atraumatic Eyes-  Sclera clear, conjunctiva pink Ears- hearing  intact Oropharynx- clear Neck- supple, no JVP ICD  pocket is well healed Lymph- no cervical lymphadenopathy Lungs- Clear to ausculation bilaterally, normal work of breathing Heart- Regular rate and rhythm, no murmurs, rubs or gallops, PMI not laterally displaced GI- soft, NT, ND, + BS Extremities- no clubbing, cyanosis, 1+ edema  EKG 05/12/11 reveals sinus rhythm 80 bpm, left bundle branch block (QRS 172) Echo 05/20/11 reveals severe global dysfunction EF 15-20%, LVEDD 60  Assessment and Plan:

## 2011-09-01 NOTE — Assessment & Plan Note (Addendum)
Stable Doing well s/p recent BiV ICD implantation. Unfortunately, LV lead could not be successfully implanted endocardially due to small CS branch anatomy. She remains quite symptomatic with her CHF.  She has a LBBB.  I would like for her to proceed with epicardial LV lead placement if technically feasible.  I will therefore refer her to Dr Morton Peters for consideration of an epicardial LV lead placement procedure.  Volume is stable No medication changes today. Return for follow-up in 3 months.

## 2011-09-01 NOTE — Telephone Encounter (Signed)
[name] is in the process of setting the patient up and will call her with the details once complete

## 2011-09-08 ENCOUNTER — Institutional Professional Consult (permissible substitution) (INDEPENDENT_AMBULATORY_CARE_PROVIDER_SITE_OTHER): Payer: PRIVATE HEALTH INSURANCE | Admitting: Cardiothoracic Surgery

## 2011-09-08 ENCOUNTER — Encounter: Payer: PRIVATE HEALTH INSURANCE | Admitting: Cardiothoracic Surgery

## 2011-09-08 ENCOUNTER — Encounter: Payer: Self-pay | Admitting: Cardiothoracic Surgery

## 2011-09-08 VITALS — BP 106/72 | HR 80 | Resp 20 | Ht 62.0 in | Wt 236.0 lb

## 2011-09-08 DIAGNOSIS — I429 Cardiomyopathy, unspecified: Secondary | ICD-10-CM

## 2011-09-08 DIAGNOSIS — I5022 Chronic systolic (congestive) heart failure: Secondary | ICD-10-CM

## 2011-09-08 NOTE — Progress Notes (Signed)
PCP is Feliciana Rossetti, MD                                        [address]            [place] [address]          [phone]    Referring Provider is Allred, Quita Skye, MD  Chief Complaint  Patient presents with  . Congestive Heart Failure    Referral from Dr Johney Frame for eval on LV lead placement procedure    HPI: the patient is a 60 year old Caucasian female with severe left ventricular dysfunction from nonischemic cardiomyopathy. This developed following chemotherapy (herceptin,femara) combined with radiation therapy and right lumpectomy for early stage breast cancer in 2010. She received her treatment by Dr. Amie Critchley at [facility] center. Prior to chemotherapy she had a normal 2-D echo. Decerebrate 2011 she presented with class IV CHF and was found by 2-D echo to have EF of 20-25%. Cardiac catheterization showed no significant coronary artery disease. In September 2012 she underwent a AICD combined biventricular pacemaker by Dr. Johney Frame. Attempt at left ventricular lead via the coronary sinus was unsuccessful. The patient has significant left bundle branch block with severe LV dysfunction and now presents for discussion of placement of LV epicardial wires for biventricular pacing strategy. She has clear class III CHF symptoms which are slowly progressing. She is on maximal medical therapy by her primary cardiologist Dr. Doylene Bode..  I initially saw the patient in February 2010 when she developed a spontaneous type B. descending thoracic aortic dissection. This was treated conservatively with blood pressure control and by 18 months later the false lumen had quickly resolved. Her aortic diameter is normal. She also developed DVT associated with her admission for CHF in December 2011 and has been off Coumadin now for 6 months. She is in a sinus rhythm approximately 80 with the pacemaker set as a backup at 40. The AICD pacemaker placed was a St. Jude model  [id number].  Her most recent  2-D e in October shows EF of 25% minimal mitral regurgitation no pericardial effusion no aortic valve disease and moderate elevation of pulmonary pressures. Her most recent CT scan of the chest shows no evidence of residual aortic dissection, minimal pleural effusion, no significant pulmonary parenchymal disease.   Past Medical History  Diagnosis Date  . Nonischemic cardiomyopathy     Admission 12/11 with EF 25%, normal coronary arteries by cath 12/11; NICM presumed to be secondary to chemotherapy for breast cancer  . DVT (deep venous thrombosis) 10/18/2010  . Obesity   . 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 Hypertension   . Glaucoma   . Depression   . Breast cancer 07/2009    Stage 3 lumpectomy, radiation, chemotherapy; finished chemo October, felt to be in remission  . History of shingles   . Hyperlipidemia   . Chronic systolic dysfunction of left ventricle   . Left bundle branch block   . Cardiac defibrillator in place     St. Jude (JA) 9/12; LV lead placement unsuccessful    Past Surgical History  Procedure Date  . Tubal ligation     Bilateral  . Oophorectomy     Single  . Breast lumpectomy     Right breast  . Tonsillectomy   . Cardiac defibrillator placement     Family History  Problem Relation Age of Onset  . Hypertension Mother   . Hypertension Father   . Hypertension Brother   . Hypertension Maternal Grandfather   . Heart attack Maternal Grandfather     MI  . Coronary artery disease Maternal Grandfather   . Hypertension Paternal Grandfather   . Heart attack Paternal Grandfather     MI  . Coronary artery disease Paternal Grandfather   . Hypertension Brother     Social History History  Substance Use Topics  . Smoking status: Never Smoker   . Smokeless tobacco: Not on file  . Alcohol Use: No    Current Outpatient Prescriptions  Medication Sig Dispense Refill  . albuterol (PROVENTIL) (2.5 MG/3ML) 0.083% nebulizer  solution Take 2.5 mg by nebulization as needed.        . ALPRAZolam (XANAX) 1 MG tablet Take 1 mg by mouth as needed.        [name] aspirin 81 MG EC tablet Take 81 mg by mouth daily.        . Bepotastine Besilate (BEPREVE) 1.5 % SOLN Apply to eye as needed.        . bimatoprost (LUMIGAN) 0.03 % ophthalmic solution Place 1 drop into both eyes at bedtime.        . Calcium Carbonate-Vitamin D (CALCIUM-VITAMIN D3) 600-200 MG-UNIT TABS Take 1 tablet by mouth 2 (two) times daily.        . carvedilol (COREG) 3.125 MG tablet TAKE 2 TABLETS IN THE AM AND TAKE 1 TABLET IN THE PM  90 tablet  12  . fexofenadine (ALLEGRA) 180 MG tablet Take 180 mg by mouth as needed.        . fluticasone (FLONASE) 50 MCG/ACT nasal spray Place 2 sprays into the nose as needed.        . furosemide (LASIX) 40 MG tablet Take 1.5 tablets (60 mg total) by mouth 2 (two) times daily.  90 tablet  6  . letrozole (FEMARA) 2.5 MG tablet Take 2.5 mg by mouth every evening.        . montelukast (SINGULAIR) 10 MG tablet Take 10 mg by mouth as needed.        . Multiple Vitamin (MULTIVITAMIN) tablet Take 1 tablet by mouth daily.        . niacin (NIASPAN) 1000 MG CR tablet Take 1,000 mg by mouth at bedtime.        [name] 20 MG capsule Take 20 mg by mouth 2 (two) times daily.        [name] PARoxetine (PAXIL) 10 MG tablet Take 10 mg by mouth every morning.        . potassium chloride (MICRO-K) 10 MEQ CR capsule Take 20 mEq by mouth 2 (two) times daily.       . ranitidine (ZANTAC) 300 MG capsule Take 300 mg by mouth at bedtime.        . simvastatin (ZOCOR) 20 MG tablet Take 20 mg by mouth at bedtime.        . valsartan (DIOVAN) 160 MG tablet Take 1 tablet (160 mg total) by mouth daily.  30 tablet  11    Allergies  Allergen Reactions  . Tape     Blister from clear tapes.    Review of Systems  She works on th [employer] of [address] She is extremely active. She did get short of breath shopping and has to stop frequently. She  denies fever weight loss. Denies recent upper respiratory infection. No  history of diabetes or thyroid disease. She is a never smoker. No history of stroke or neurologic disorder. She denies previous thoracic surgery or thoracic trauma. She is now 2 years  breast cancer free. Neuro review is positive for depression negative her stroke or seizure. Hematologic views negative for bleeding disorder or blood transfusion or anemia.  BP 106/72  Pulse 80  Resp 20  Ht 5\' 2"  (1.575 m)  Wt 236 lb (107.049 kg)  BMI 43.17 kg/m2  SpO2 97% Physical Exam middle-aged obese Caucasian female no acute distress.                          HEENT  normocephalic pupils equal dentition good                          NECK no JVD mass or tenderness, no bruit                          THORAX nontender without deformity breath sounds clear                           CARDIAC regular rhythm no murmur S4 heart sounds present                            ABDOMEN soft nontender without pulsatile mass                             EXTREMITY no clubbing cyanosis tenderness, minimal pedal edema                             NEURO alert and oriented no focal motor deficit   TESTS: 2-D echo and cardiac catheterization reviewed. CT scan and chest x-ray reviewed.    IIMPRESSIONn:non-ischemic cardiomyopathy with left bundle branch block and progressing class III symptoms of CHF. The patient would benefit from biventricular pacing strategy which can be accomplished with epicardial LV leads in connection to the AICD-pacemaker.   Plan: Left VATS for epicardial lead placement Friday, November 16 at [facility] hospital.

## 2011-09-08 NOTE — Patient Instructions (Signed)
Continue current medications until surgery

## 2011-09-09 ENCOUNTER — Other Ambulatory Visit: Payer: Self-pay

## 2011-09-09 ENCOUNTER — Encounter: Payer: PRIVATE HEALTH INSURANCE | Admitting: Cardiothoracic Surgery

## 2011-09-12 ENCOUNTER — Encounter (HOSPITAL_COMMUNITY): Payer: Self-pay | Admitting: Pharmacy Technician

## 2011-09-14 ENCOUNTER — Encounter (HOSPITAL_COMMUNITY): Payer: Self-pay

## 2011-09-14 ENCOUNTER — Other Ambulatory Visit: Payer: Self-pay

## 2011-09-14 ENCOUNTER — Encounter (HOSPITAL_COMMUNITY)
Admission: RE | Admit: 2011-09-14 | Discharge: 2011-09-14 | Disposition: A | Discharge status: Home / Self Care | Payer: PRIVATE HEALTH INSURANCE | Source: Ambulatory Visit | Attending: Cardiothoracic Surgery | Admitting: Cardiothoracic Surgery

## 2011-09-14 HISTORY — DX: Heart failure, unspecified: I50.9

## 2011-09-14 HISTORY — DX: Gastro-esophageal reflux disease without esophagitis: K21.9

## 2011-09-14 HISTORY — DX: Diaphragmatic hernia without obstruction or gangrene: K44.9

## 2011-09-14 HISTORY — DX: Unspecified osteoarthritis, unspecified site: M19.90

## 2011-09-14 HISTORY — DX: Shortness of breath: R06.02

## 2011-09-14 LAB — COMPREHENSIVE METABOLIC PANEL
ALT: 17 U/L (ref 0–35)
AST: 31 U/L (ref 0–37)
Albumin: 3.5 g/dL (ref 3.5–5.2)
Alkaline Phosphatase: 105 U/L (ref 39–117)
BUN: 15 mg/dL (ref 6–23)
CO2: 25 mEq/L (ref 19–32)
Calcium: 9.4 mg/dL (ref 8.4–10.5)
Chloride: 104 mEq/L (ref 96–112)
Creatinine, Ser: 0.77 mg/dL (ref 0.50–1.10)
GFR calc Af Amer: >90 mL/min (ref >90)
GFR calc non Af Amer: 89 mL/min — ABNORMAL LOW (ref >90)
Glucose, Bld: 78 mg/dL (ref 70–99)
Potassium: 3.7 mEq/L (ref 3.5–5.1)
Sodium: 141 mEq/L (ref 135–145)
Total Bilirubin: 0.4 mg/dL (ref 0.3–1.2)
Total Protein: 6.5 g/dL (ref 6.0–8.3)

## 2011-09-14 LAB — URINALYSIS, ROUTINE W REFLEX MICROSCOPIC
Bilirubin Urine: NEGATIVE
Glucose, UA: NEGATIVE mg/dL
Hgb urine dipstick: NEGATIVE
Ketones, ur: NEGATIVE mg/dL
Leukocytes, UA: NEGATIVE
Nitrite: NEGATIVE
Protein, ur: NEGATIVE mg/dL
Specific Gravity, Urine: 1.013 (ref 1.005–1.030)
Urobilinogen, UA: 0.2 mg/dL (ref 0.0–1.0)
pH: 5 (ref 5.0–8.0)

## 2011-09-14 LAB — CBC
HCT: 36.4 % (ref 36.0–46.0)
Hemoglobin: 12.4 g/dL (ref 12.0–15.0)
MCH: 30.5 pg (ref 26.0–34.0)
MCHC: 34.1 g/dL (ref 30.0–36.0)
MCV: 89.7 fL (ref 78.0–100.0)
Platelets: 116 10*3/uL — ABNORMAL LOW (ref 150–400)
RBC: 4.06 MIL/uL (ref 3.87–5.11)
RDW: 13.5 % (ref 11.5–15.5)
WBC: 4.7 10*3/uL (ref 4.0–10.5)

## 2011-09-14 LAB — BLOOD GAS, ARTERIAL
Acid-Base Excess: 1.4 mmol/L (ref 0.0–2.0)
Bicarbonate: 24.9 mEq/L — ABNORMAL HIGH (ref 20.0–24.0)
Drawn by: 181601
FIO2: 0.21 %
O2 Saturation: 99.2 %
Patient temperature: 98.6
TCO2: 26 mmol/L (ref 0–100)
pCO2 arterial: 35.5 mmHg (ref 35.0–45.0)
pH, Arterial: 7.46 — ABNORMAL HIGH (ref 7.350–7.400)
pO2, Arterial: 116 mmHg — ABNORMAL HIGH (ref 80.0–100.0)

## 2011-09-14 LAB — PROTIME-INR
INR: 1.03 (ref 0.00–1.49)
Prothrombin Time: 13.7 seconds (ref 11.6–15.2)

## 2011-09-14 LAB — SURGICAL PCR SCREEN
MRSA, PCR: NEGATIVE
Staphylococcus aureus: NEGATIVE

## 2011-09-14 LAB — ABO/RH: ABO/RH(D): O POS

## 2011-09-14 LAB — APTT: aPTT: 28 seconds (ref 24–37)

## 2011-09-14 NOTE — Progress Notes (Signed)
Faxed ICD form to [facility], received confirmation. Notified [facility] Small St. Jude rep and he states he is aware of surgery.  Spoke with Onalee Hua at [facility]-states most recent office note is in [employer]

## 2011-09-14 NOTE — Pre-Procedure Instructions (Signed)
20 [name]  09/14/2011   Your procedure is scheduled on:  09/16/2011  Report to [facility] at 5:30 AM.  Call this number if you have problems the morning of surgery: [phone]   Remember:   Do not eat food:After Midnight.  Do not drink clear liquids: 4 Hours before arrival.  Take these medicines the morning of surgery with A SIP OF WATER: Coreg, Inhaler, Xanax, Omeprazole, Paxil   Do not wear jewelry, make-up or nail polish.  Do not wear lotions, powders, or perfumes. You may wear deodorant.  Do not shave 48 hours prior to surgery.  Do not bring valuables to the hospital.  Contacts, dentures or bridgework may not be worn into surgery.  Leave suitcase in the car. After surgery it may be brought to your room.  For patients admitted to the hospital, checkout time is 11:00 AM the day of discharge.   Patients discharged the day of surgery will not be allowed to drive home.  Name and phone number of your driver: daughter [name] cell [phone]  Special Instructions: CHG Shower Use Special Wash: 1/2 bottle night before surgery and 1/2 bottle morning of surgery.   Please read over the following fact sheets that you were given: Pain Booklet, Coughing and Deep Breathing, Blood Transfusion Information, MRSA Information and Surgical Site Infection Prevention

## 2011-09-15 MED ORDER — DEXTROSE 5 % IV SOLN
1.5000 g | INTRAVENOUS | Status: AC
  Administered 2011-09-16: 1.5 g via INTRAVENOUS
  Filled 2011-09-15: qty 1.5

## 2011-09-15 NOTE — Anesthesia Preprocedure Evaluation (Addendum)
Anesthesia Evaluation  Patient identified by MRN, date of birth, ID band Patient awake    Reviewed: Allergy & Precautions, H&P , NPO status , Patient's Chart, lab work & pertinent test results  Airway Mallampati: I TM Distance: >3 FB Neck ROM: Full    Dental  (+) Teeth Intact and Dental Advisory Given   Pulmonary    Pulmonary exam normal       Cardiovascular Exercise Tolerance: Poor hypertension, Pt. on medications and Pt. on home beta blockers +CHF Regular  Non-ischemic CM, St. Jude AICD 07/2011 Echo 05/20/11 EF 15-20%, severely dilated LV with severe global hypokinesis, mod diastolic dysfunction Cath 09/2010 no CAD Type B Aortic dissection 2010 Known L BBB    Neuro/Psych PSYCHIATRIC DISORDERS Depression  Neuromuscular disease    GI/Hepatic hiatal hernia, GERD-  Medicated and Controlled,  Endo/Other  Morbid obesity  Renal/GU      Musculoskeletal   Abdominal   Peds  Hematology   Anesthesia Other Findings   Reproductive/Obstetrics                       Anesthesia Physical Anesthesia Plan  ASA: IV  Anesthesia Plan: General   Post-op Pain Management:    Induction: Intravenous  Airway Management Planned: Double Lumen EBT  Additional Equipment: CVP, Ultrasound Guidance Line Placement and Arterial line  Intra-op Plan:   Post-operative Plan: Possible Post-op intubation/ventilation  Informed Consent: I have reviewed the patients History and Physical, chart, labs and discussed the procedure including the risks, benefits and alternatives for the proposed anesthesia with the patient or authorized representative who has indicated his/her understanding and acceptance.   Dental advisory given  Plan Discussed with: CRNA and Surgeon  Anesthesia Plan Comments:         Anesthesia Quick Evaluation

## 2011-09-15 NOTE — Progress Notes (Signed)
Message left at [facility] cardiology to fax orders regarding aicd  Programming orders.

## 2011-09-15 NOTE — Consult Note (Signed)
Anesthesia:  60 year old female for left VATS for placement of LV epicardial lead.  Hx significant for severe LV dysfunction/non-ischemic CM following chemo for breast CA, s/p St. Jude AICD (Dr. Johney Frame), DVT, obesity, very mild OSA by sleep study done 11/10/10 (Dr. Shelle Iron), Type B aortic dissection tx conservatively, glaucoma, known left BBB, HTN, GERD, and hiatal hernia.  Dr. Sanjuana Kava is her primary Cardiologist.  Her last echo on 05/20/11 shows severely dilated LV with severe global hypokinesis, EF 15-20%, moderate diastolic dysfunction, mild MR.  Cardiac cath on 12/07/11showed no evidence of CAD (found in E-chart with copy on chart).  Labs/CXR noted.  Plan to proceed.

## 2011-09-16 ENCOUNTER — Inpatient Hospital Stay (HOSPITAL_COMMUNITY)
Admission: RE | Admit: 2011-09-16 | Discharge: 2011-09-23 | DRG: 260 | Disposition: A | Discharge status: Home / Self Care | Payer: PRIVATE HEALTH INSURANCE | Source: Ambulatory Visit | Attending: Internal Medicine | Admitting: Internal Medicine

## 2011-09-16 ENCOUNTER — Inpatient Hospital Stay (HOSPITAL_COMMUNITY): Payer: PRIVATE HEALTH INSURANCE | Admitting: Vascular Surgery

## 2011-09-16 ENCOUNTER — Encounter (HOSPITAL_COMMUNITY)
Admission: RE | Disposition: A | Discharge status: Home / Self Care | Payer: Self-pay | Source: Ambulatory Visit | Attending: Cardiothoracic Surgery

## 2011-09-16 ENCOUNTER — Encounter (HOSPITAL_COMMUNITY): Payer: Self-pay | Admitting: Certified Registered Nurse Anesthetist

## 2011-09-16 ENCOUNTER — Inpatient Hospital Stay (HOSPITAL_COMMUNITY): Payer: PRIVATE HEALTH INSURANCE

## 2011-09-16 ENCOUNTER — Encounter (HOSPITAL_COMMUNITY): Payer: Self-pay | Admitting: Vascular Surgery

## 2011-09-16 ENCOUNTER — Encounter (HOSPITAL_COMMUNITY): Payer: Self-pay | Admitting: *Deleted

## 2011-09-16 DIAGNOSIS — Z853 Personal history of malignant neoplasm of breast: Secondary | ICD-10-CM

## 2011-09-16 DIAGNOSIS — Z86718 Personal history of other venous thrombosis and embolism: Secondary | ICD-10-CM

## 2011-09-16 DIAGNOSIS — I7100 Dissection of unspecified site of aorta: Secondary | ICD-10-CM | POA: Diagnosis present

## 2011-09-16 DIAGNOSIS — I472 Ventricular tachycardia, unspecified: Secondary | ICD-10-CM | POA: Diagnosis not present

## 2011-09-16 DIAGNOSIS — I428 Other cardiomyopathies: Secondary | ICD-10-CM | POA: Diagnosis present

## 2011-09-16 DIAGNOSIS — R609 Edema, unspecified: Secondary | ICD-10-CM

## 2011-09-16 DIAGNOSIS — E785 Hyperlipidemia, unspecified: Secondary | ICD-10-CM | POA: Diagnosis present

## 2011-09-16 DIAGNOSIS — H409 Unspecified glaucoma: Secondary | ICD-10-CM | POA: Diagnosis present

## 2011-09-16 DIAGNOSIS — Z01818 Encounter for other preprocedural examination: Secondary | ICD-10-CM

## 2011-09-16 DIAGNOSIS — F3289 Other specified depressive episodes: Secondary | ICD-10-CM | POA: Diagnosis present

## 2011-09-16 DIAGNOSIS — Z01812 Encounter for preprocedural laboratory examination: Secondary | ICD-10-CM

## 2011-09-16 DIAGNOSIS — I4891 Unspecified atrial fibrillation: Secondary | ICD-10-CM

## 2011-09-16 DIAGNOSIS — I447 Left bundle-branch block, unspecified: Secondary | ICD-10-CM | POA: Insufficient documentation

## 2011-09-16 DIAGNOSIS — K449 Diaphragmatic hernia without obstruction or gangrene: Secondary | ICD-10-CM | POA: Diagnosis present

## 2011-09-16 DIAGNOSIS — I429 Cardiomyopathy, unspecified: Secondary | ICD-10-CM

## 2011-09-16 DIAGNOSIS — I5022 Chronic systolic (congestive) heart failure: Principal | ICD-10-CM

## 2011-09-16 DIAGNOSIS — I10 Essential (primary) hypertension: Secondary | ICD-10-CM | POA: Insufficient documentation

## 2011-09-16 DIAGNOSIS — F329 Major depressive disorder, single episode, unspecified: Secondary | ICD-10-CM | POA: Diagnosis present

## 2011-09-16 DIAGNOSIS — I4729 Other ventricular tachycardia: Secondary | ICD-10-CM | POA: Diagnosis not present

## 2011-09-16 HISTORY — PX: THORACOTOMY: SHX5074

## 2011-09-16 LAB — POCT I-STAT 7, (LYTES, BLD GAS, ICA,H+H)
Acid-Base Excess: 3 mmol/L — ABNORMAL HIGH (ref 0.0–2.0)
Acid-Base Excess: 4 mmol/L — ABNORMAL HIGH (ref 0.0–2.0)
Acid-Base Excess: 5 mmol/L — ABNORMAL HIGH (ref 0.0–2.0)
Bicarbonate: 27.7 mEq/L — ABNORMAL HIGH (ref 20.0–24.0)
Bicarbonate: 30.4 mEq/L — ABNORMAL HIGH (ref 20.0–24.0)
Bicarbonate: 30.5 mEq/L — ABNORMAL HIGH (ref 20.0–24.0)
Bicarbonate: 31.4 mEq/L — ABNORMAL HIGH (ref 20.0–24.0)
Calcium, Ion: 1.2 mmol/L (ref 1.12–1.32)
Calcium, Ion: 1.21 mmol/L (ref 1.12–1.32)
Calcium, Ion: 1.26 mmol/L (ref 1.12–1.32)
Calcium, Ion: 1.26 mmol/L (ref 1.12–1.32)
HCT: 31 % — ABNORMAL LOW (ref 36.0–46.0)
HCT: 33 % — ABNORMAL LOW (ref 36.0–46.0)
HCT: 34 % — ABNORMAL LOW (ref 36.0–46.0)
HCT: 35 % — ABNORMAL LOW (ref 36.0–46.0)
Hemoglobin: 10.5 g/dL — ABNORMAL LOW (ref 12.0–15.0)
Hemoglobin: 11.2 g/dL — ABNORMAL LOW (ref 12.0–15.0)
Hemoglobin: 11.6 g/dL — ABNORMAL LOW (ref 12.0–15.0)
Hemoglobin: 11.9 g/dL — ABNORMAL LOW (ref 12.0–15.0)
O2 Saturation: 100 %
O2 Saturation: 94 %
O2 Saturation: 96 %
O2 Saturation: 98 %
Patient temperature: 36.4
Patient temperature: 99
Patient temperature: 99.6
Potassium: 3.3 mEq/L — ABNORMAL LOW (ref 3.5–5.1)
Potassium: 3.6 mEq/L (ref 3.5–5.1)
Potassium: 3.6 mEq/L (ref 3.5–5.1)
Potassium: 4.1 mEq/L (ref 3.5–5.1)
Sodium: 139 mEq/L (ref 135–145)
Sodium: 140 mEq/L (ref 135–145)
Sodium: 141 mEq/L (ref 135–145)
Sodium: 142 mEq/L (ref 135–145)
TCO2: 30 mmol/L (ref 0–100)
TCO2: 32 mmol/L (ref 0–100)
TCO2: 32 mmol/L (ref 0–100)
TCO2: 33 mmol/L (ref 0–100)
pCO2 arterial: 45.2 mmHg — ABNORMAL HIGH (ref 35.0–45.0)
pCO2 arterial: 60.3 mmHg (ref 35.0–45.0)
pCO2 arterial: 60.6 mmHg (ref 35.0–45.0)
pCO2 arterial: 62.3 mmHg (ref 35.0–45.0)
pH, Arterial: 7.272 — ABNORMAL LOW (ref 7.350–7.400)
pH, Arterial: 7.309 — ABNORMAL LOW (ref 7.350–7.400)
pH, Arterial: 7.313 — ABNORMAL LOW (ref 7.350–7.400)
pH, Arterial: 7.435 — ABNORMAL HIGH (ref 7.350–7.400)
pO2, Arterial: 123 mmHg — ABNORMAL HIGH (ref 80.0–100.0)
pO2, Arterial: 395 mmHg — ABNORMAL HIGH (ref 80.0–100.0)
pO2, Arterial: 79 mmHg — ABNORMAL LOW (ref 80.0–100.0)
pO2, Arterial: 96 mmHg (ref 80.0–100.0)

## 2011-09-16 SURGERY — THORACOTOMY, MAJOR
Anesthesia: General | Site: Chest | Laterality: Left | Wound class: Clean

## 2011-09-16 MED ORDER — SODIUM CHLORIDE 0.9 % IR SOLN
Status: DC | PRN
  Administered 2011-09-16: 1000 mL

## 2011-09-16 MED ORDER — BUPIVACAINE 0.5 % ON-Q PUMP SINGLE CATH 400 ML
INJECTION | Status: DC | PRN
  Administered 2011-09-16: 400 mL

## 2011-09-16 MED ORDER — BUPIVACAINE 0.5 % ON-Q PUMP SINGLE CATH 400 ML
400.0000 mL | INJECTION | Freq: Once | Status: DC
  Filled 2011-09-16: qty 400

## 2011-09-16 MED ORDER — ONDANSETRON HCL 4 MG/2ML IJ SOLN: 4.0000 mg | Freq: Once | INTRAMUSCULAR | Status: AC | PRN

## 2011-09-16 MED ORDER — MEPERIDINE HCL 25 MG/ML IJ SOLN: 6.2500 mg | INTRAMUSCULAR | Status: DC | PRN

## 2011-09-16 MED ORDER — NEOSTIGMINE METHYLSULFATE 1 MG/ML IJ SOLN
INTRAMUSCULAR | Status: DC | PRN
  Administered 2011-09-16: 4 mg via INTRAVENOUS

## 2011-09-16 MED ORDER — ONDANSETRON HCL 4 MG/2ML IJ SOLN
INTRAMUSCULAR | Status: DC | PRN
  Administered 2011-09-16: 4 mg via INTRAVENOUS

## 2011-09-16 MED ORDER — MIDAZOLAM HCL 5 MG/5ML IJ SOLN
INTRAMUSCULAR | Status: DC | PRN
  Administered 2011-09-16 (×2): 2 mg via INTRAVENOUS

## 2011-09-16 MED ORDER — FENTANYL CITRATE 0.05 MG/ML IJ SOLN
INTRAMUSCULAR | Status: DC | PRN
  Administered 2011-09-16 (×4): 50 ug via INTRAVENOUS
  Administered 2011-09-16: 100 ug via INTRAVENOUS

## 2011-09-16 MED ORDER — ONDANSETRON HCL 4 MG/2ML IJ SOLN: 4.0000 mg | Freq: Once | INTRAMUSCULAR | Status: DC | PRN

## 2011-09-16 MED ORDER — DEXTROSE 5 % IV SOLN
1.5000 g | Freq: Two times a day (BID) | INTRAVENOUS | Status: AC
  Administered 2011-09-16 – 2011-09-18 (×4): 1.5 g via INTRAVENOUS
  Filled 2011-09-16 (×4): qty 1.5

## 2011-09-16 MED ORDER — FENTANYL CITRATE 0.05 MG/ML IJ SOLN
INTRAMUSCULAR | Status: AC
  Filled 2011-09-16: qty 2

## 2011-09-16 MED ORDER — GLYCOPYRROLATE 0.2 MG/ML IJ SOLN
INTRAMUSCULAR | Status: DC | PRN
  Administered 2011-09-16: .8 mg via INTRAVENOUS
  Administered 2011-09-16: 0.2 mg via INTRAVENOUS

## 2011-09-16 MED ORDER — LACTATED RINGERS IV SOLN
INTRAVENOUS | Status: DC | PRN
  Administered 2011-09-16 (×3): via INTRAVENOUS

## 2011-09-16 MED ORDER — EPHEDRINE SULFATE 50 MG/ML IJ SOLN
INTRAMUSCULAR | Status: DC | PRN
  Administered 2011-09-16: 5 mg via INTRAVENOUS

## 2011-09-16 MED ORDER — VECURONIUM BROMIDE 10 MG IV SOLR
INTRAVENOUS | Status: DC | PRN
  Administered 2011-09-16: 2 mg via INTRAVENOUS

## 2011-09-16 MED ORDER — ROCURONIUM BROMIDE 100 MG/10ML IV SOLN
INTRAVENOUS | Status: DC | PRN
  Administered 2011-09-16: 50 mg via INTRAVENOUS

## 2011-09-16 MED ORDER — ETOMIDATE 2 MG/ML IV SOLN
INTRAVENOUS | Status: DC | PRN
  Administered 2011-09-16: 20 mg via INTRAVENOUS

## 2011-09-16 MED ORDER — HYDROMORPHONE HCL PF 1 MG/ML IJ SOLN
0.2500 mg | INTRAMUSCULAR | Status: DC | PRN
  Administered 2011-09-16 (×3): 0.5 mg via INTRAVENOUS

## 2011-09-16 MED ORDER — HYDROMORPHONE HCL PF 1 MG/ML IJ SOLN
INTRAMUSCULAR | Status: AC
  Administered 2011-09-16: 0.5 mg via INTRAVENOUS
  Filled 2011-09-16: qty 1

## 2011-09-16 MED ORDER — HYDROMORPHONE HCL PF 1 MG/ML IJ SOLN
0.2500 mg | INTRAMUSCULAR | Status: DC | PRN
  Administered 2011-09-16: 0.5 mg via INTRAVENOUS

## 2011-09-16 MED ORDER — PHENYLEPHRINE HCL 10 MG/ML IJ SOLN
10.0000 mg | INTRAVENOUS | Status: DC | PRN
  Administered 2011-09-16: 25 ug/min via INTRAVENOUS

## 2011-09-16 MED ORDER — MIDAZOLAM HCL 2 MG/2ML IJ SOLN
INTRAMUSCULAR | Status: AC
  Filled 2011-09-16: qty 2

## 2011-09-16 SURGICAL SUPPLY — 82 items
BAG DECANTER FOR FLEXI CONT (MISCELLANEOUS) IMPLANT
BLADE SURG 11 STRL SS (BLADE) ×3 IMPLANT
CANISTER SUCTION 2500CC (MISCELLANEOUS) ×3 IMPLANT
CATH KIT ON Q 5IN SLV (PAIN MANAGEMENT) ×3 IMPLANT
CATH ROBINSON RED A/P 22FR (CATHETERS) IMPLANT
CATH THORACIC 28FR (CATHETERS) IMPLANT
CATH THORACIC 36FR (CATHETERS) IMPLANT
CATH THORACIC 36FR RT ANG (CATHETERS) IMPLANT
CHERRY SPONGEY 1/2 (GAUZE/BANDAGES/DRESSINGS) IMPLANT
CLIP TI MEDIUM 24 (CLIP) ×3 IMPLANT
CLOTH BEACON ORANGE TIMEOUT ST (SAFETY) ×3 IMPLANT
CONN Y 3/8X3/8X3/8  BEN (MISCELLANEOUS)
CONN Y 3/8X3/8X3/8 BEN (MISCELLANEOUS) IMPLANT
CONT SPEC 4OZ CLIKSEAL STRL BL (MISCELLANEOUS) ×3 IMPLANT
COVER SURGICAL LIGHT HANDLE (MISCELLANEOUS) ×6 IMPLANT
DERMABOND ADVANCED (GAUZE/BANDAGES/DRESSINGS) ×4
DERMABOND ADVANCED .7 DNX12 (GAUZE/BANDAGES/DRESSINGS) ×2 IMPLANT
DRAIN CHANNEL 32F RND 10.7 FF (WOUND CARE) ×3 IMPLANT
DRAPE LAPAROSCOPIC ABDOMINAL (DRAPES) ×3 IMPLANT
DRAPE SLUSH MACHINE 52X66 (DRAPES) ×3 IMPLANT
DRAPE WARM FLUID 44X44 (DRAPE) IMPLANT
ELECT REM PT RETURN 9FT ADLT (ELECTROSURGICAL) ×3
ELECTRODE REM PT RTRN 9FT ADLT (ELECTROSURGICAL) ×1 IMPLANT
GAUZE SPONGE 4X4 12PLY STRL LF (GAUZE/BANDAGES/DRESSINGS) ×6 IMPLANT
GEL ULTRASOUND 20GR AQUASONIC (MISCELLANEOUS) IMPLANT
GLOVE BIO SURGEON STRL SZ 6 (GLOVE) ×3 IMPLANT
GLOVE BIO SURGEON STRL SZ7.5 (GLOVE) ×6 IMPLANT
GLOVE BIOGEL PI IND STRL 7.0 (GLOVE) ×3 IMPLANT
GLOVE BIOGEL PI INDICATOR 7.0 (GLOVE) ×6
GOWN STRL NON-REIN LRG LVL3 (GOWN DISPOSABLE) ×9 IMPLANT
IMPL BIOMEC 54 ~~LOC~~ (Pacemaker) ×2 IMPLANT
IMPLANT BIOMEC 54 ~~LOC~~ (Pacemaker) ×6 IMPLANT
KIT BASIN OR (CUSTOM PROCEDURE TRAY) ×3 IMPLANT
KIT ROOM TURNOVER OR (KITS) ×3 IMPLANT
KIT SUCTION CATH 14FR (SUCTIONS) ×3 IMPLANT
NS IRRIG 1000ML POUR BTL (IV SOLUTION) ×6 IMPLANT
PACK CHEST (CUSTOM PROCEDURE TRAY) ×3 IMPLANT
PAD ARMBOARD 7.5X6 YLW CONV (MISCELLANEOUS) ×3 IMPLANT
SEALANT SURG COSEAL 4ML (VASCULAR PRODUCTS) IMPLANT
SOLUTION ANTI FOG 6CC (MISCELLANEOUS) IMPLANT
SPECIMEN JAR MEDIUM (MISCELLANEOUS) IMPLANT
SPONGE GAUZE 4X4 12PLY (GAUZE/BANDAGES/DRESSINGS) ×3 IMPLANT
SPONGE TONSIL 1.25 RF SGL STRG (GAUZE/BANDAGES/DRESSINGS) IMPLANT
STAPLER VISISTAT 35W (STAPLE) IMPLANT
SUCTION POOLE TIP (SUCTIONS) IMPLANT
SUT CHROMIC 3 0 SH 27 (SUTURE) IMPLANT
SUT ETHILON 3 0 PS 1 (SUTURE) IMPLANT
SUT MNCRL AB 4-0 PS2 18 (SUTURE) ×6 IMPLANT
SUT PROLENE 3 0 SH DA (SUTURE) IMPLANT
SUT PROLENE 4 0 RB 1 (SUTURE) ×4
SUT PROLENE 4-0 RB1 .5 CRCL 36 (SUTURE) IMPLANT
SUT PROLENE 4-0 RB1 18X2 ARM (SUTURE) ×2 IMPLANT
SUT SILK  1 MH (SUTURE) ×4
SUT SILK 1 MH (SUTURE) ×2 IMPLANT
SUT SILK 2 0 SH CR/8 (SUTURE) ×3 IMPLANT
SUT SILK 2 0SH CR/8 30 (SUTURE) IMPLANT
SUT SILK 3 0 SH CR/8 (SUTURE) ×3 IMPLANT
SUT SILK 3 0SH CR/8 30 (SUTURE) IMPLANT
SUT VIC AB 1 CTX 18 (SUTURE) ×3 IMPLANT
SUT VIC AB 2 TP1 27 (SUTURE) IMPLANT
SUT VIC AB 2-0 CT1 27 (SUTURE)
SUT VIC AB 2-0 CT1 TAPERPNT 27 (SUTURE) IMPLANT
SUT VIC AB 2-0 CTX 36 (SUTURE) ×6 IMPLANT
SUT VIC AB 3-0 MH 27 (SUTURE) IMPLANT
SUT VIC AB 3-0 SH 27 (SUTURE)
SUT VIC AB 3-0 SH 27X BRD (SUTURE) IMPLANT
SUT VIC AB 3-0 SH 8-18 (SUTURE) ×6 IMPLANT
SUT VIC AB 3-0 X1 27 (SUTURE) IMPLANT
SUT VICRYL 0 UR6 27IN ABS (SUTURE) IMPLANT
SUT VICRYL 2 TP 1 (SUTURE) ×3 IMPLANT
SUT VICRYL 4-0 PS2 18IN ABS (SUTURE) IMPLANT
SWAB COLLECTION DEVICE MRSA (MISCELLANEOUS) IMPLANT
SYSTEM SAHARA CHEST DRAIN ATS (WOUND CARE) ×3 IMPLANT
TAPE CLOTH SURG 4X10 WHT LF (GAUZE/BANDAGES/DRESSINGS) ×3 IMPLANT
TAPE PAPER 2X10 WHT MICROPORE (GAUZE/BANDAGES/DRESSINGS) ×6 IMPLANT
TOWEL OR 17X24 6PK STRL BLUE (TOWEL DISPOSABLE) ×3 IMPLANT
TOWEL OR 17X26 10 PK STRL BLUE (TOWEL DISPOSABLE) ×3 IMPLANT
TRAP SPECIMEN MUCOUS 40CC (MISCELLANEOUS) IMPLANT
TRAY FOLEY CATH 14FR (SET/KITS/TRAYS/PACK) ×3 IMPLANT
TUBE ANAEROBIC SPECIMEN COL (MISCELLANEOUS) IMPLANT
TUNNELER SHEATH ON-Q 11GX8 (MISCELLANEOUS) ×3 IMPLANT
WATER STERILE IRR 1000ML POUR (IV SOLUTION) ×6 IMPLANT

## 2011-09-16 NOTE — Pre-Procedure Instructions (Signed)
The patient was examined and preop studies reviewed. There has been no change from the prior exam and the patient is ready for surgery.We plan on placing an LV epicardial lead as well as an atrial lead.

## 2011-09-16 NOTE — Progress Notes (Signed)
[name] advised of pts arrival ... He will be here for this surgery .

## 2011-09-16 NOTE — Anesthesia Postprocedure Evaluation (Signed)
  Anesthesia Post-op Note  Patient: [name]  Procedure(s) Performed:  THORACOTOMY MAJOR - left anterolateral Thoracotomy for placement of St. Jude epicardial pacing lead   Patient Location: PACU  Anesthesia Type: General  Level of Consciousness: sedated  Airway and Oxygen Therapy: Patient Spontanous Breathing and Patient connected to face mask oxygen  Post-op Pain: none  Post-op Assessment: Post-op Vital signs reviewed, Patient's Cardiovascular Status Stable, Respiratory Function Stable and Patent Airway  Post-op Vital Signs: stable  Complications: No apparent anesthesia complications

## 2011-09-16 NOTE — Progress Notes (Signed)
Dr. Donata Clay updated regarding patient's current condition.  ABG results presented.  Orders received for Bipap changes, discontinuation of Dilaudid, and ABG in 1 hour.

## 2011-09-16 NOTE — Brief Op Note (Signed)
09/16/2011  6:38 PM  PATIENT:  [name]  60 y.o. female  PRE-OPERATIVE DIAGNOSIS:  Cardiomyopathy with left Bundle Branch Block  POST-OPERATIVE DIAGNOSIS:  Cardiomyopathy with left Bundle Branch Block  PROCEDURE:  Procedure(s): THORACOTOMY MAJOR for placement of LV epicardial pacing leads  Placement of On-Q catheter Pain management system SURGEON:  Surgeon(s): Mikey Bussing, MD  PHYSICIAN ASSISTANT: Coral Ceo PA-C  ASSISTANTS: }   ANESTHESIA:   general  EBL:  Total I/O In: 2300 [I.V.:2300] Out: 300 [Urine:250; Blood:50]  BLOOD ADMINISTERED:none  DRAINS: L chest tube   LOCAL MEDICATIONS USED: none  SPECIMEN: none  DISPOSITION OF SPECIMEN:  none  COUNTS:  YES  TOURNIQUET:  none  DICTATION: .Other Dictation: Dictation Number [id number]  PLAN OF CARE: Admit to inpatient   PATIENT DISPOSITION:  ICU - intubated and hemodynamically stable.   Delay start of Pharmacological VTE agent (>24hrs) :  yes

## 2011-09-16 NOTE — Anesthesia Procedure Notes (Addendum)
Procedure Name: Intubation Date/Time: 09/16/2011 3:36 PM Performed by: Romie Minus Pre-anesthesia Checklist: Patient identified, Emergency Drugs available, Suction available and Patient being monitored Patient Re-evaluated:Patient Re-evaluated prior to inductionOxygen Delivery Method: Circle System Utilized Preoxygenation: Pre-oxygenation with 100% oxygen Intubation Type: IV induction Ventilation: Two handed mask ventilation required and Oral airway inserted - appropriate to patient size Grade View: Grade II Tube type: Oral Endobronchial tube: Left and Double lumen EBT and 39 Fr Number of attempts: 2 Airway Equipment and Method: video-laryngoscopy Placement Confirmation: breath sounds checked- equal and bilateral and positive ETCO2 Dental Injury: Teeth and Oropharynx as per pre-operative assessment  Comments: DL x1 with MAC 3. Unable to pass DLT. DL x 1 with video glidescope. DLT passed.    Date/Time: 09/16/2011 4:58 PM Performed by: Romie Minus Pre-anesthesia Checklist: Patient identified, Emergency Drugs available, Suction available and Patient being monitored Oxygen Delivery Method: Circle System Utilized Preoxygenation: Pre-oxygenation with 100% oxygen Tube type: Oral Tube size: 8.0 mm Intubation method: DLT exchanged for single ETT with [facility] exchange catheter. with ease. Placement Confirmation: positive ETCO2 and breath sounds checked- equal and bilateral Secured at: 23 cm Tube secured with: Tape Dental Injury: Teeth and Oropharynx as per pre-operative assessment

## 2011-09-16 NOTE — Transfer of Care (Signed)
Immediate Anesthesia Transfer of Care Note  Patient: [name]  Procedure(s) Performed:  THORACOTOMY MAJOR - left anterolateral Thoracotomy for placement of St. Jude epicardial pacing lead   Patient Location: PACU  Anesthesia Type: General  Level of Consciousness: awake, alert , oriented and patient cooperative  Airway & Oxygen Therapy: Patient Spontanous Breathing and Patient connected to face mask oxygen  Post-op Assessment: Report given to PACU RN, Post -op Vital signs reviewed and stable and Patient moving all extremities X 4  Post vital signs: Reviewed and stable  Complications: No apparent anesthesia complications

## 2011-09-16 NOTE — Preoperative (Addendum)
Beta Blockers   Reason not to administer Beta Blockers:Not Applicable 

## 2011-09-17 ENCOUNTER — Inpatient Hospital Stay (HOSPITAL_COMMUNITY): Payer: PRIVATE HEALTH INSURANCE

## 2011-09-17 LAB — TYPE AND SCREEN
ABO/RH(D): O POS
Antibody Screen: NEGATIVE
Unit division: 0
Unit division: 0

## 2011-09-17 LAB — POCT I-STAT 3, ART BLOOD GAS (G3+)
Acid-Base Excess: 1 mmol/L (ref 0.0–2.0)
Acid-Base Excess: 4 mmol/L — ABNORMAL HIGH (ref 0.0–2.0)
Bicarbonate: 28.6 mEq/L — ABNORMAL HIGH (ref 20.0–24.0)
Bicarbonate: 31 mEq/L — ABNORMAL HIGH (ref 20.0–24.0)
O2 Saturation: 95 %
O2 Saturation: 96 %
Patient temperature: 99
Patient temperature: 98.7
TCO2: 30 mmol/L (ref 0–100)
TCO2: 33 mmol/L (ref 0–100)
pCO2 arterial: 58 mmHg (ref 35.0–45.0)
pCO2 arterial: 54.2 mmHg — ABNORMAL HIGH (ref 35.0–45.0)
pH, Arterial: 7.302 — ABNORMAL LOW (ref 7.350–7.400)
pH, Arterial: 7.366 (ref 7.350–7.400)
pO2, Arterial: 87 mmHg (ref 80.0–100.0)
pO2, Arterial: 84 mmHg (ref 80.0–100.0)

## 2011-09-17 LAB — BASIC METABOLIC PANEL
BUN: 12 mg/dL (ref 6–23)
CO2: 28 mEq/L (ref 19–32)
Calcium: 9.7 mg/dL (ref 8.4–10.5)
Chloride: 98 mEq/L (ref 96–112)
Creatinine, Ser: 0.76 mg/dL (ref 0.50–1.10)
GFR calc Af Amer: >90 mL/min (ref >90)
GFR calc non Af Amer: 90 mL/min — ABNORMAL LOW (ref >90)
Glucose, Bld: 181 mg/dL — ABNORMAL HIGH (ref 70–99)
Potassium: 3.4 mEq/L — ABNORMAL LOW (ref 3.5–5.1)
Sodium: 138 mEq/L (ref 135–145)

## 2011-09-17 LAB — GLUCOSE, CAPILLARY
Glucose-Capillary: 114 mg/dL — ABNORMAL HIGH (ref 70–99)
Glucose-Capillary: 112 mg/dL — ABNORMAL HIGH (ref 70–99)
Glucose-Capillary: 147 mg/dL — ABNORMAL HIGH (ref 70–99)
Glucose-Capillary: 136 mg/dL — ABNORMAL HIGH (ref 70–99)
Glucose-Capillary: 133 mg/dL — ABNORMAL HIGH (ref 70–99)

## 2011-09-17 LAB — CBC
HCT: 35.9 % — ABNORMAL LOW (ref 36.0–46.0)
Hemoglobin: 11.9 g/dL — ABNORMAL LOW (ref 12.0–15.0)
MCH: 30.4 pg (ref 26.0–34.0)
MCHC: 33.1 g/dL (ref 30.0–36.0)
MCV: 91.6 fL (ref 78.0–100.0)
Platelets: 109 10*3/uL — ABNORMAL LOW (ref 150–400)
RBC: 3.92 MIL/uL (ref 3.87–5.11)
RDW: 13.7 % (ref 11.5–15.5)
WBC: 13.5 10*3/uL — ABNORMAL HIGH (ref 4.0–10.5)

## 2011-09-17 MED ORDER — METHYLPREDNISOLONE SODIUM SUCC 125 MG IJ SOLR
INTRAMUSCULAR | Status: AC
  Administered 2011-09-17: 80 mg via INTRAVENOUS
  Filled 2011-09-17: qty 2

## 2011-09-17 MED ORDER — ALPRAZOLAM 0.5 MG PO TABS
0.5000 mg | ORAL_TABLET | Freq: Two times a day (BID) | ORAL | Status: AC | PRN
Start: 2011-09-17 — End: 2011-09-21

## 2011-09-17 MED ORDER — POTASSIUM CHLORIDE 10 MEQ/50ML IV SOLN
10.0000 meq | INTRAVENOUS | Status: AC
  Administered 2011-09-17 (×3): 10 meq via INTRAVENOUS

## 2011-09-17 MED ORDER — PANTOPRAZOLE SODIUM 40 MG PO TBEC
40.0000 mg | DELAYED_RELEASE_TABLET | Freq: Every day | ORAL | Status: DC
  Administered 2011-09-17 – 2011-09-23 (×7): 40 mg via ORAL
  Filled 2011-09-17 (×7): qty 1

## 2011-09-17 MED ORDER — OXYCODONE-ACETAMINOPHEN 5-325 MG PO TABS
1.0000 | ORAL_TABLET | ORAL | Status: DC | PRN
  Administered 2011-09-18: 2 via ORAL
  Administered 2011-09-18 – 2011-09-23 (×7): 1 via ORAL
  Filled 2011-09-17: qty 1
  Filled 2011-09-17: qty 2
  Filled 2011-09-17 (×2): qty 1
  Filled 2011-09-17: qty 2
  Filled 2011-09-17 (×4): qty 1

## 2011-09-17 MED ORDER — POTASSIUM CHLORIDE 10 MEQ/50ML IV SOLN
INTRAVENOUS | Status: AC
  Filled 2011-09-17: qty 50

## 2011-09-17 MED ORDER — MONTELUKAST SODIUM 10 MG PO TABS
10.0000 mg | ORAL_TABLET | Freq: Every day | ORAL | Status: DC
  Administered 2011-09-17 – 2011-09-22 (×6): 10 mg via ORAL
  Filled 2011-09-17 (×8): qty 1

## 2011-09-17 MED ORDER — ALBUTEROL SULFATE HFA 108 (90 BASE) MCG/ACT IN AERS
2.0000 | INHALATION_SPRAY | RESPIRATORY_TRACT | Status: DC | PRN
  Filled 2011-09-17: qty 6.7

## 2011-09-17 MED ORDER — ONDANSETRON HCL 4 MG/2ML IJ SOLN: 4.0000 mg | Freq: Four times a day (QID) | INTRAMUSCULAR | Status: DC | PRN

## 2011-09-17 MED ORDER — ACETAMINOPHEN 10 MG/ML IV SOLN
1000.0000 mg | Freq: Once | INTRAVENOUS | Status: DC
  Filled 2011-09-17: qty 100

## 2011-09-17 MED ORDER — TRAMADOL HCL 50 MG PO TABS
50.0000 mg | ORAL_TABLET | Freq: Four times a day (QID) | ORAL | Status: DC | PRN
  Administered 2011-09-17 – 2011-09-18 (×2): 100 mg via ORAL
  Administered 2011-09-19 – 2011-09-21 (×2): 50 mg via ORAL
  Administered 2011-09-21 – 2011-09-23 (×4): 100 mg via ORAL
  Filled 2011-09-17 (×6): qty 2
  Filled 2011-09-17 (×2): qty 1

## 2011-09-17 MED ORDER — SIMVASTATIN 20 MG PO TABS
20.0000 mg | ORAL_TABLET | Freq: Every day | ORAL | Status: DC
  Administered 2011-09-17 – 2011-09-22 (×6): 20 mg via ORAL
  Filled 2011-09-17 (×7): qty 1

## 2011-09-17 MED ORDER — POTASSIUM CHLORIDE 10 MEQ/50ML IV SOLN
INTRAVENOUS | Status: AC
  Administered 2011-09-17: 10 meq via INTRAVENOUS
  Filled 2011-09-17: qty 50

## 2011-09-17 MED ORDER — INSULIN ASPART 100 UNIT/ML ~~LOC~~ SOLN
0.0000 [IU] | SUBCUTANEOUS | Status: DC
  Administered 2011-09-17 – 2011-09-18 (×3): 2 [IU] via SUBCUTANEOUS
  Filled 2011-09-17: qty 3

## 2011-09-17 MED ORDER — KCL IN DEXTROSE-NACL 10-5-0.45 MEQ/L-%-% IV SOLN
INTRAVENOUS | Status: DC
  Administered 2011-09-17 – 2011-09-18 (×2): via INTRAVENOUS
  Filled 2011-09-17 (×5): qty 1000

## 2011-09-17 MED ORDER — FENTANYL CITRATE 0.05 MG/ML IJ SOLN
50.0000 ug | INTRAMUSCULAR | Status: DC | PRN
  Administered 2011-09-17 – 2011-09-18 (×8): 50 ug via INTRAVENOUS
  Filled 2011-09-17 (×4): qty 2

## 2011-09-17 MED ORDER — FUROSEMIDE 10 MG/ML IJ SOLN
40.0000 mg | Freq: Once | INTRAMUSCULAR | Status: AC
  Administered 2011-09-17: 40 mg via INTRAVENOUS

## 2011-09-17 MED ORDER — BIMATOPROST 0.03 % OP SOLN
1.0000 [drp] | Freq: Every day | OPHTHALMIC | Status: DC
  Administered 2011-09-17 – 2011-09-20 (×3): 1 [drp] via OPHTHALMIC
  Filled 2011-09-17 (×2): qty 2.5

## 2011-09-17 MED ORDER — BISACODYL 5 MG PO TBEC
10.0000 mg | DELAYED_RELEASE_TABLET | Freq: Every day | ORAL | Status: DC
  Administered 2011-09-17 – 2011-09-22 (×4): 10 mg via ORAL
  Filled 2011-09-17 (×5): qty 2

## 2011-09-17 MED ORDER — SENNOSIDES-DOCUSATE SODIUM 8.6-50 MG PO TABS
1.0000 | ORAL_TABLET | Freq: Every evening | ORAL | Status: DC | PRN
  Filled 2011-09-17: qty 1

## 2011-09-17 MED ORDER — PAROXETINE HCL 10 MG PO TABS
10.0000 mg | ORAL_TABLET | ORAL | Status: DC
  Administered 2011-09-17 – 2011-09-23 (×7): 10 mg via ORAL
  Filled 2011-09-17 (×8): qty 1

## 2011-09-17 MED ORDER — BEPOTASTINE BESILATE 1.5 % OP SOLN: 1.0000 [drp] | Freq: Once | OPHTHALMIC | Status: DC

## 2011-09-17 MED ORDER — NALOXONE HCL 0.4 MG/ML IJ SOLN
INTRAMUSCULAR | Status: AC
  Administered 2011-09-17: 0.2 mg via INTRAVENOUS
  Filled 2011-09-17: qty 1

## 2011-09-17 MED ORDER — LETROZOLE 2.5 MG PO TABS
2.5000 mg | ORAL_TABLET | Freq: Every evening | ORAL | Status: DC
  Administered 2011-09-17 – 2011-09-22 (×6): 2.5 mg via ORAL
  Filled 2011-09-17 (×9): qty 1

## 2011-09-17 MED ORDER — POTASSIUM CHLORIDE 10 MEQ/50ML IV SOLN
10.0000 meq | Freq: Every day | INTRAVENOUS | Status: DC | PRN
  Administered 2011-09-17 – 2011-09-18 (×4): 10 meq via INTRAVENOUS
  Filled 2011-09-17 (×2): qty 50

## 2011-09-17 MED ORDER — VANCOMYCIN HCL IN DEXTROSE 1-5 GM/200ML-% IV SOLN
1000.0000 mg | Freq: Two times a day (BID) | INTRAVENOUS | Status: AC
  Administered 2011-09-17 – 2011-09-18 (×4): 1000 mg via INTRAVENOUS
  Filled 2011-09-17 (×4): qty 200

## 2011-09-17 MED ORDER — OXYCODONE HCL 5 MG PO TABS
5.0000 mg | ORAL_TABLET | ORAL | Status: AC | PRN
  Administered 2011-09-17 (×2): 5 mg via ORAL
  Filled 2011-09-17 (×3): qty 1

## 2011-09-17 MED ORDER — NAPHAZOLINE-PHENIRAMINE 0.025-0.3 % OP SOLN
1.0000 [drp] | Freq: Once | OPHTHALMIC | Status: DC
  Filled 2011-09-17: qty 15

## 2011-09-17 MED ORDER — FUROSEMIDE 10 MG/ML IJ SOLN
INTRAMUSCULAR | Status: AC
  Administered 2011-09-17: 40 mg via INTRAVENOUS
  Filled 2011-09-17: qty 4

## 2011-09-17 MED ORDER — ACETAMINOPHEN 10 MG/ML IV SOLN
1000.0000 mg | Freq: Four times a day (QID) | INTRAVENOUS | Status: AC
  Administered 2011-09-17 (×4): 1000 mg via INTRAVENOUS
  Filled 2011-09-17 (×4): qty 100

## 2011-09-17 MED ORDER — BUPIVACAINE ON-Q PAIN PUMP (FOR ORDER SET NO CHG)
INJECTION | Status: DC
  Filled 2011-09-17: qty 1

## 2011-09-17 MED ORDER — FUROSEMIDE 10 MG/ML IJ SOLN
40.0000 mg | Freq: Once | INTRAMUSCULAR | Status: AC
  Administered 2011-09-17: 40 mg via INTRAVENOUS
  Filled 2011-09-17: qty 4

## 2011-09-17 MED ORDER — LEVALBUTEROL HCL 0.63 MG/3ML IN NEBU
0.6300 mg | INHALATION_SOLUTION | Freq: Four times a day (QID) | RESPIRATORY_TRACT | Status: DC
  Administered 2011-09-17 – 2011-09-21 (×11): 0.63 mg via RESPIRATORY_TRACT
  Filled 2011-09-17 (×28): qty 3

## 2011-09-17 NOTE — Op Note (Signed)
NAME:  [name], DAQUILA NO.:  [id number]  MEDICAL RECORD NO.:  [id number]  LOCATION:  [facility]                         FACILITY:  [facility]  PHYSICIAN:  Kerin Perna, M.D.  DATE OF BIRTH:  1951-01-14  DATE OF PROCEDURE: DATE OF DISCHARGE:                              OPERATIVE REPORT   OPERATION: 1. Left anterior thoracotomy for placement of left ventricular     epicardial pacing leads. 2. Revision of automatic implantable cardioverter defibrillator-     biventricular pacemaker. 3. Placement of On-Q wound irrigation system.  SURGEON:  Kerin Perna, MD  ASSISTANT:  Coral Ceo, P A-C  PREOPERATIVE DIAGNOSIS:  Idiopathic nonischemic cardiomyopathy, needing biventricular pacing after failed percutaneous attempt for placement of a left ventricular lead.  POSTOPERATIVE DIAGNOSIS:  Idiopathic nonischemic cardiomyopathy, needing biventricular pacing after failed percutaneous attempt for placement of a left ventricular lead.  INDICATIONS:  The patient is a very nice 60 year old obese Caucasian female, who was treated successfully for carcinoma of the right breast at [facility] but had developed severe LV dysfunction following completion of chemotherapy.  An AICD BiV pacer was placed by Dr. Johney Frame, however, due to her coronary sinus and coronary vein anatomy, an LV lead was unable to be placed.  It was felt that she would benefit significantly and symptomatically from placement of an LV lead via thoracotomy.  Prior to surgery, I examined the patient in the office and reviewed the results of the situation with the patient and family.  I discussed the details of anterolateral thoracotomy for placement of epicardial leads including the use of general anesthesia, location of the surgical incision, the plan for postoperative pain management, and the potential complications of infection, pneumonia, bleeding and transfusion and death.  She understood  and agreed to proceed.  PROCEDURE:  The patient was prepared by Anesthesia and then brought to the operating room and placed supine on the operating table.  General anesthesia was induced.  The patient was presented with folded soft pads to bump her left side up but not a full lateral position.  The chest, abdomen were then prepped and draped as a sterile field.  First, the AICD pocket was opened and the device was extracted and was intact.  Next, an anterior thoracotomy on the left was performed after a proper time-out.  The chest was entered in the fifth interspace without dividing the rib.  The rib was elevated using the Rultrac chest wall elevator.  The lung was compressed with a moist gauze pad, and the pericardium was opened.  Next, two St. Jude epicardial pacing leads were placed on the lateral left ventricular myocardial wall.  These 2 were tested and the lead with the best threshold and impedance was selected for active use.  The 2 leads were tunneled transthoracically from the pleural space into the AICD pocket.  One was capped and the other one was connected to the LV pacing terminal of the AICD pacer.  The device was activated and the ventricle was pacing nicely with a reduction in the QRS interval.  The AICD pocket was irrigated and then closed in layers using Vicryl.  A chest tube was  positioned in the left chest and directed towards the apex.  Under direct vision, the lung was re-expanded.  The ribs were reapproximated with #2 Vicryl pericostals.  The fascial layer was closed with a #1 interrupted Vicryl.  The subcutaneous layer was closed with a running Vicryl, and the skin was closed with a subcuticular.  The chest tube was connected to underwater seal Pleur-Evac suction drainage.  An On-Q catheter was placed between the chest tube and the thoracotomy incision and connected to a 0.5% Marcaine reservoir.  The plan is to extubate the patient and return to the recovery  room and then the ICU for monitoring.     Kerin Perna, M.D.     PV/MEDQ  D:  09/16/2011  T:  09/16/2011  Job:  [id number]  cc:   Hillis Range, MD

## 2011-09-17 NOTE — Progress Notes (Signed)
   CARDIOTHORACIC SURGERY PROGRESS NOTE   R1 Day Post-Op Procedure(s) (LRB): THORACOTOMY MAJOR (Left)  Subjective: Sore in chest but otherwise okay.  Breathing comfortably.  Objective: Vital signs: Filed Vitals:   09/17/11 1100  BP: 111/62  Pulse: 88  Temp:   Resp: 17    Hemodynamics:    Physical Exam:  Rhythm:   sinus  Breath sounds: Shallow, clear  Heart sounds:  distant  Incisions:  dry  Abdomen:  soft  Extremities:  Warm  Chest tubes:  Trivial output, no air leak   Intake/Output from previous day: 11/16 0701 - 11/17 0700 In: 3050 [I.V.:3000; IV Piggyback:50] Out: 1686 [Urine:1525; Blood:50; Chest Tube:111] Intake/Output this shift: Total I/O In: 50 [I.V.:50] Out: 165 [Urine:145; Chest Tube:20]  Lab Results:  [facility] 09/17/11 0425 09/16/11 2339 09/14/11 1221  WBC 13.5* -- 4.7  HGB 11.9* 11.9* --  HCT 35.9* 35.0* --  PLT 109* -- 116*   BMET:  Basename 09/17/11 0425 09/16/11 2339 09/14/11 1221  NA 138 141 --  K 3.4* 4.1 --  CL 98 -- 104  CO2 28 -- 25  GLUCOSE 181* -- 78  BUN 12 -- 15  CREATININE 0.76 -- 0.77  CALCIUM 9.7 -- 9.4    CBG (last 3)   Basename 09/17/11 0804 09/17/11 0358 09/17/11 0100  GLUCAP 147* 112* 114*   ABG    Component Value Date/Time   PHART 7.366 09/17/2011 0429   HCO3 31.0* 09/17/2011 0429   TCO2 33 09/17/2011 0429   O2SAT 96.0 09/17/2011 0429     Assessment/Plan: S/P Procedure(s) (LRB): THORACOTOMY MAJOR (Left)  Stable POD1 Mobilize D/c aline D/c chest tube  OWEN,CLARENCE H

## 2011-09-18 ENCOUNTER — Inpatient Hospital Stay (HOSPITAL_COMMUNITY): Payer: PRIVATE HEALTH INSURANCE

## 2011-09-18 LAB — COMPREHENSIVE METABOLIC PANEL
ALT: 16 U/L (ref 0–35)
AST: 33 U/L (ref 0–37)
Albumin: 3.1 g/dL — ABNORMAL LOW (ref 3.5–5.2)
Alkaline Phosphatase: 82 U/L (ref 39–117)
BUN: 11 mg/dL (ref 6–23)
CO2: 31 mEq/L (ref 19–32)
Calcium: 9.5 mg/dL (ref 8.4–10.5)
Chloride: 98 mEq/L (ref 96–112)
Creatinine, Ser: 0.7 mg/dL (ref 0.50–1.10)
GFR calc Af Amer: >90 mL/min (ref >90)
GFR calc non Af Amer: >90 mL/min (ref >90)
Glucose, Bld: 136 mg/dL — ABNORMAL HIGH (ref 70–99)
Potassium: 3.6 mEq/L (ref 3.5–5.1)
Sodium: 136 mEq/L (ref 135–145)
Total Bilirubin: 0.4 mg/dL (ref 0.3–1.2)
Total Protein: 6.4 g/dL (ref 6.0–8.3)

## 2011-09-18 LAB — CBC
HCT: 33.7 % — ABNORMAL LOW (ref 36.0–46.0)
Hemoglobin: 11 g/dL — ABNORMAL LOW (ref 12.0–15.0)
MCH: 30.1 pg (ref 26.0–34.0)
MCHC: 32.6 g/dL (ref 30.0–36.0)
MCV: 92.3 fL (ref 78.0–100.0)
Platelets: 108 10*3/uL — ABNORMAL LOW (ref 150–400)
RBC: 3.65 MIL/uL — ABNORMAL LOW (ref 3.87–5.11)
RDW: 14 % (ref 11.5–15.5)
WBC: 12 10*3/uL — ABNORMAL HIGH (ref 4.0–10.5)

## 2011-09-18 LAB — GLUCOSE, CAPILLARY
Glucose-Capillary: 100 mg/dL — ABNORMAL HIGH (ref 70–99)
Glucose-Capillary: 104 mg/dL — ABNORMAL HIGH (ref 70–99)
Glucose-Capillary: 111 mg/dL — ABNORMAL HIGH (ref 70–99)
Glucose-Capillary: 114 mg/dL — ABNORMAL HIGH (ref 70–99)
Glucose-Capillary: 126 mg/dL — ABNORMAL HIGH (ref 70–99)
Glucose-Capillary: 97 mg/dL (ref 70–99)
Glucose-Capillary: 98 mg/dL (ref 70–99)

## 2011-09-18 MED ORDER — ASPIRIN 81 MG PO TBEC
81.0000 mg | DELAYED_RELEASE_TABLET | Freq: Every day | ORAL | Status: DC
  Administered 2011-09-18 – 2011-09-19 (×2): 81 mg via ORAL
  Filled 2011-09-18 (×3): qty 1

## 2011-09-18 MED ORDER — POTASSIUM CHLORIDE CRYS ER 20 MEQ PO TBCR
40.0000 meq | EXTENDED_RELEASE_TABLET | Freq: Two times a day (BID) | ORAL | Status: DC
  Administered 2011-09-18 – 2011-09-21 (×3): 40 meq via ORAL
  Filled 2011-09-18 (×7): qty 2

## 2011-09-18 MED ORDER — ALUM & MAG HYDROXIDE-SIMETH 200-200-20 MG/5ML PO SUSP
30.0000 mL | Freq: Four times a day (QID) | ORAL | Status: DC | PRN
  Administered 2011-09-18: 30 mL via ORAL
  Filled 2011-09-18 (×2): qty 30

## 2011-09-18 MED ORDER — POTASSIUM CHLORIDE 10 MEQ/50ML IV SOLN
INTRAVENOUS | Status: AC
  Administered 2011-09-18: 10 meq
  Filled 2011-09-18: qty 50

## 2011-09-18 MED ORDER — NIACIN ER (ANTIHYPERLIPIDEMIC) 500 MG PO TBCR
1000.0000 mg | EXTENDED_RELEASE_TABLET | Freq: Every day | ORAL | Status: DC
  Administered 2011-09-18 – 2011-09-22 (×5): 1000 mg via ORAL
  Filled 2011-09-18 (×6): qty 2

## 2011-09-18 MED ORDER — CARVEDILOL 3.125 MG PO TABS
3.1250 mg | ORAL_TABLET | Freq: Two times a day (BID) | ORAL | Status: DC
  Administered 2011-09-19 – 2011-09-20 (×2): 3.125 mg via ORAL
  Filled 2011-09-18 (×6): qty 1

## 2011-09-18 MED ORDER — POTASSIUM CHLORIDE 10 MEQ/50ML IV SOLN
INTRAVENOUS | Status: AC
  Administered 2011-09-18: 10 meq via INTRAVENOUS
  Filled 2011-09-18: qty 50

## 2011-09-18 MED ORDER — POTASSIUM CHLORIDE 10 MEQ/50ML IV SOLN
10.0000 meq | INTRAVENOUS | Status: AC
  Administered 2011-09-18 (×3): 10 meq via INTRAVENOUS
  Filled 2011-09-18: qty 100

## 2011-09-18 MED ORDER — FUROSEMIDE 40 MG PO TABS
60.0000 mg | ORAL_TABLET | Freq: Two times a day (BID) | ORAL | Status: DC
  Administered 2011-09-18 – 2011-09-21 (×3): 60 mg via ORAL
  Filled 2011-09-18 (×7): qty 1

## 2011-09-18 NOTE — Progress Notes (Signed)
   CARDIOTHORACIC SURGERY PROGRESS NOTE   R2 Days Post-Op Procedure(s) (LRB): THORACOTOMY MAJOR (Left)  Subjective: Feels pretty well except sore in chest  Objective: Vital signs: Filed Vitals:   09/18/11 1400  BP: 84/48  Pulse: 80  Temp:   Resp: 10    Hemodynamics:    Physical Exam:  Rhythm:   sinus  Breath sounds: diminished  Heart sounds:  distant  Incisions:  Dressings dry  Abdomen:  Very large  Extremities:  warm   Intake/Output from previous day: 11/17 0701 - 11/18 0700 In: 800 [P.O.:360; I.V.:390; IV Piggyback:50] Out: 2065 [Urine:2045; Chest Tube:20] Intake/Output this shift: Total I/O In: 410 [P.O.:240; I.V.:120; IV Piggyback:50] Out: 80 [Urine:80]  Lab Results:  [facility] 09/18/11 0454 09/17/11 0425  WBC 12.0* 13.5*  HGB 11.0* 11.9*  HCT 33.7* 35.9*  PLT 108* 109*   BMET:  Basename 09/18/11 0454 09/17/11 0425  NA 136 138  K 3.6 3.4*  CL 98 98  CO2 31 28  GLUCOSE 136* 181*  BUN 11 12  CREATININE 0.70 0.76  CALCIUM 9.5 9.7    CBG (last 3)   Basename 09/18/11 1158 09/18/11 0810 09/18/11 0354  GLUCAP 104* 100* 114*   ABG    Component Value Date/Time   PHART 7.366 09/17/2011 0429   HCO3 31.0* 09/17/2011 0429   TCO2 33 09/17/2011 0429   O2SAT 96.0 09/17/2011 0429     Assessment/Plan: S/P Procedure(s) (LRB): THORACOTOMY MAJOR (Left)  Doing well POD2 Transfer 2000  Julian Askin H

## 2011-09-19 ENCOUNTER — Other Ambulatory Visit: Payer: Self-pay

## 2011-09-19 ENCOUNTER — Inpatient Hospital Stay (HOSPITAL_COMMUNITY): Payer: PRIVATE HEALTH INSURANCE

## 2011-09-19 ENCOUNTER — Inpatient Hospital Stay (HOSPITAL_COMMUNITY): Payer: PRIVATE HEALTH INSURANCE | Admitting: Anesthesiology

## 2011-09-19 ENCOUNTER — Encounter (HOSPITAL_COMMUNITY): Payer: Self-pay | Admitting: Anesthesiology

## 2011-09-19 DIAGNOSIS — I319 Disease of pericardium, unspecified: Secondary | ICD-10-CM

## 2011-09-19 DIAGNOSIS — I4891 Unspecified atrial fibrillation: Secondary | ICD-10-CM

## 2011-09-19 LAB — CBC
HCT: 37 % (ref 36.0–46.0)
MCV: 94.1 fL (ref 78.0–100.0)
RBC: 3.93 MIL/uL (ref 3.87–5.11)
RDW: 14.2 % (ref 11.5–15.5)
WBC: 9 10*3/uL (ref 4.0–10.5)

## 2011-09-19 LAB — BASIC METABOLIC PANEL
BUN: 10 mg/dL (ref 6–23)
BUN: 8 mg/dL (ref 6–23)
CO2: 30 mEq/L (ref 19–32)
Chloride: 98 mEq/L (ref 96–112)
Chloride: 97 mEq/L (ref 96–112)
Creatinine, Ser: 0.78 mg/dL (ref 0.50–1.10)
Creatinine, Ser: 0.64 mg/dL (ref 0.50–1.10)
GFR calc Af Amer: >90 mL/min (ref >90)
GFR calc non Af Amer: >90 mL/min (ref >90)

## 2011-09-19 LAB — GLUCOSE, CAPILLARY
Glucose-Capillary: 112 mg/dL — ABNORMAL HIGH (ref 70–99)
Glucose-Capillary: 107 mg/dL — ABNORMAL HIGH (ref 70–99)
Glucose-Capillary: 165 mg/dL — ABNORMAL HIGH (ref 70–99)

## 2011-09-19 LAB — MAGNESIUM: Magnesium: 3.7 mg/dL — ABNORMAL HIGH (ref 1.5–2.5)

## 2011-09-19 LAB — HEPATIC FUNCTION PANEL
Bilirubin, Direct: 0.1 mg/dL (ref 0.0–0.3)
Indirect Bilirubin: 0.4 mg/dL (ref 0.3–0.9)
Total Bilirubin: 0.5 mg/dL (ref 0.3–1.2)

## 2011-09-19 MED ORDER — DIGOXIN 0.25 MG/ML IJ SOLN
0.2500 mg | Freq: Every day | INTRAMUSCULAR | Status: AC
  Administered 2011-09-19: 0.25 mg via INTRAVENOUS
  Filled 2011-09-19: qty 1

## 2011-09-19 MED ORDER — SODIUM CHLORIDE 0.9 % IJ SOLN
INTRAMUSCULAR | Status: AC
  Filled 2011-09-19: qty 10

## 2011-09-19 MED ORDER — METOPROLOL TARTRATE 1 MG/ML IV SOLN
5.0000 mg | Freq: Once | INTRAVENOUS | Status: AC
  Administered 2011-09-19: 2.5 mg via INTRAVENOUS

## 2011-09-19 MED ORDER — MAGNESIUM SULFATE 50 % IJ SOLN: 1.0000 g | Freq: Once | INTRAMUSCULAR | Status: DC

## 2011-09-19 MED ORDER — DEXTROSE 5 % IV SOLN
150.0000 mg | Freq: Once | INTRAVENOUS | Status: AC
  Administered 2011-09-19: 150 mg via INTRAVENOUS
  Filled 2011-09-19: qty 3

## 2011-09-19 MED ORDER — METOPROLOL TARTRATE 1 MG/ML IV SOLN
INTRAVENOUS | Status: AC
  Filled 2011-09-19: qty 5

## 2011-09-19 MED ORDER — SODIUM CHLORIDE 0.9 % IJ SOLN
INTRAMUSCULAR | Status: AC
  Administered 2011-09-19: 10 mL via INTRAVENOUS
  Filled 2011-09-19: qty 30

## 2011-09-19 MED ORDER — DIGOXIN 0.25 MG/ML IJ SOLN
0.2500 mg | Freq: Every day | INTRAMUSCULAR | Status: AC
  Filled 2011-09-19: qty 1

## 2011-09-19 MED ORDER — SUCCINYLCHOLINE CHLORIDE 20 MG/ML IJ SOLN
INTRAMUSCULAR | Status: DC | PRN
  Administered 2011-09-19: 100 mg via INTRAVENOUS

## 2011-09-19 MED ORDER — DEXTROSE 5 % IV SOLN
450.0000 mg | INTRAVENOUS | Status: DC | PRN
  Administered 2011-09-19: 60 mg/h via INTRAVENOUS

## 2011-09-19 MED ORDER — MAGNESIUM SULFATE IN D5W 10-5 MG/ML-% IV SOLN
1.0000 g | Freq: Once | INTRAVENOUS | Status: DC
  Filled 2011-09-19: qty 100

## 2011-09-19 MED ORDER — SODIUM CHLORIDE 0.9 % IV SOLN: INTRAVENOUS | Status: DC

## 2011-09-19 MED ORDER — ETOMIDATE 2 MG/ML IV SOLN
INTRAVENOUS | Status: DC | PRN
  Administered 2011-09-19: 20 mg via INTRAVENOUS

## 2011-09-19 MED ORDER — DEXTROSE 5 % IV SOLN
30.0000 mg/h | INTRAVENOUS | Status: DC
  Administered 2011-09-19: 30 mg/h via INTRAVENOUS
  Filled 2011-09-19 (×2): qty 9

## 2011-09-19 MED ORDER — SODIUM CHLORIDE 0.9 % IJ SOLN
INTRAMUSCULAR | Status: AC
  Administered 2011-09-19: 10 mL via INTRAVENOUS
  Filled 2011-09-19: qty 10

## 2011-09-19 MED ORDER — DEXTROSE 5 % IV SOLN
30.0000 mg/h | INTRAVENOUS | Status: AC
  Administered 2011-09-19 – 2011-09-20 (×3): 60 mg/h via INTRAVENOUS
  Administered 2011-09-20: 30 mg/h via INTRAVENOUS
  Filled 2011-09-19 (×4): qty 9

## 2011-09-19 NOTE — Consult Note (Addendum)
CARDIOLOGY CONSULT NOTE  Patient ID: [name]   MRN: [id number] DOB/AGE: 1950-11-27 60 y.o.  Admit date: 09/16/2011 Referring Physician  Dr Donata Clay  Primary Physician   Dr Feliciana Rossetti Primary Cardiologist   Dr Thayer Ohm McAlhany/ Dr Hillis Range Reason for Consultation  Afib RVR  JXB:JYNWG [name] is a 60 y.o. female with a history of NICM cardiomyopathy (EF approx 25%).  Pt was admitted for an epicardial lead. She was doing well, BiV pacing now and plans were for discharge. She went into rapid afib this am. Pt was shocked by her ICD (inappropriately) since her HR was high and she has an underlying RBBB. After the shock, she remained in sinus rhythm briefly but then went back into rapid A. fib. Since her defibrillator had fired inappropriately, it was turned off to prevent further shocks.  Pt c/o light-headed feeling, denies CP/SOB. She is diaphoretic. These Sx began just before her nurse came in to ask if she was OK, shortly before she was shocked. She has no palpitations, and except for the light-headed feeling/diaph, was asymptomatic prior to ICD D/C. She has no Hx of Sx like these before. Now she feels weak and with general malaise also, these Sx are new as well. All telemetry was reviewed. Overnight, she had several episodes of wide-complex tachycardia, nonsustained. These are irregular in rate or variable in morphology and therefore felt to be paroxysmal atrial fibrillation as well.  Past Medical History  Diagnosis Date  . Nonischemic cardiomyopathy     Admission 12/11 with EF 25%, normal coronary arteries by cath 12/11; NICM presumed to be secondary to chemotherapy for breast cancer  . DVT (deep venous thrombosis) 10/18/2010  . Obesity   . 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 Glaucoma   . Depression   . Breast cancer 07/2009    Stage 3 lumpectomy, radiation, chemotherapy; finished chemo October, felt to be in remission  . History of shingles   . Hyperlipidemia   . Chronic  systolic dysfunction of left ventricle   . Left bundle branch block   . Cardiac defibrillator in place     St. Jude (JA) 9/12; LV lead placement unsuccessful, s/p epicardial lead 09-16-2011  . [name]     with exertion  . Hypertension   . CHF (congestive heart failure) - chronic systolic   . GERD (gastroesophageal reflux disease)   . Arthritis     lower back  . Hiatal hernia     small hiatal hernia    Past Surgical History  Procedure Date  . Tubal ligation     Bilateral  . Oophorectomy     Single  . Breast lumpectomy     Right breast  . Tonsillectomy   . Cardiac defibrillator placement   . Cardiac catheterization     2011    Allergies  Allergen Reactions  . Tape     Blister from clear tapes.   I have reviewed the patient's current medications. Scheduled:   . aspirin  81 mg Oral Daily  . bimatoprost  1 drop Both Eyes QHS  . bisacodyl  10 mg Oral Daily  . carvedilol  3.125 mg Oral BID WC  . cefUROXime (ZINACEF)  IV  1.5 g Intravenous Q12H  . furosemide  60 mg Oral BID  . letrozole  2.5 mg Oral QPM  . levalbuterol  0.63 mg Nebulization Q6H  . montelukast  10 mg Oral QHS  . naphazoline-pheniramine  1  drop Both Eyes Once  . niacin  1,000 mg Oral QHS  . pantoprazole  40 mg Oral Q1200  . PARoxetine  10 mg Oral QAM  . potassium chloride  10 mEq Intravenous Q1 Hr x 3  . potassium chloride SA  40 mEq Oral BID  . simvastatin  20 mg Oral QHS  . vancomycin  1,000 mg Intravenous Q12H  . DISCONTD: acetaminophen  1,000 mg Intravenous Once  . DISCONTD: bupivacaine 0.5 % ON-Q pump SINGLE CATH 400 mL  400 mL Other Once  . DISCONTD: insulin aspart  0-24 Units Subcutaneous Q4H   Started because of increased HR.: amio 60MG /HR IV   digoxin  0.25 mg Intravenous Daily      History   Social History  . Marital Status: Married    Spouse Name: N/A    Number of Children: N/A  . Years of Education: N/A   Occupational History  . [employer]  .  Retired from Photographer    Social History Main Topics  . Smoking status: Never Smoker   . Smokeless tobacco: Not on file  . Alcohol Use: No  . Drug Use: No  . Sexually Active: Not on file   Other Topics Concern  . 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    Family History  Problem Relation Age of Onset  . Hypertension Mother   . Hypertension Father   . Hypertension Brother   . Hypertension Maternal Grandfather   . Heart attack Maternal Grandfather     MI  . Coronary artery disease Maternal Grandfather   . Hypertension Paternal Grandfather   . Heart attack Paternal Grandfather     MI  . Coronary artery disease Paternal Grandfather   . Hypertension Brother      Full 14 point review of systems complete and found to be negative unless listed  above  Physical Exam: Blood pressure 93/57, pulse 83, temperature 98.2 F (36.8 C), temperature source Oral, resp. rate 18, weight 247 lb 4.8 oz (112.175 kg), SpO2 92.00%.   General: Well developed, well nourished, in acute distress Head: Eyes PERRLA, No xanthomas.   Normocephalic and atraumatic, oropharynx without edema or exudate.  Lungs: Few rales . Heart: Her heart rate is rapid and irregular. No significant murmur, rub or gallop is noted. Distal pulses are not palpable in her extremities. Systolic blood pressure was dopplered at 58. Neck:  No carotid bruit. No JVD. No lymphadenopathy Abdomen: Bowel sounds present, abdomen soft and non-tender without masses or                  Hernias noted. Msk:   She is extremely weak, no joint deformities or effusions. Extremities: No clubbing, cyanosis. Trace edema.  Neuro: Alert and oriented X 3. No focal deficits noted. Psych:  Good affect, responds appropriately Skin :  No rashes or lesions noted. Labs:  [facility] 09/19/11 0525 09/18/11 0454   NA 136 136  K 4.7 3.6  CL 98 98  CO2 30 31  GLUCOSE 105* 136*  BUN 10 11  CREATININE 0.78 0.70  CALCIUM 9.9 9.5  MG -- --  PHOS -- --    Basename 09/18/11 0454  AST 33  ALT 16  ALKPHOS 82  BILITOT 0.4  PROT 6.4  ALBUMIN 3.1*    Basename 09/19/11  0525 09/18/11 0454  WBC 9.0 12.0*  NEUTROABS -- --  HGB 12.1 11.0*  HCT 37.0 33.7*  MCV 94.1 92.3  PLT 119* 108*   No results found for this basename: TSH,T4TOTAL,FREET3,T3FREE,THYROIDAB in the last 72 hours No results found for this basename: VITAMINB12:2,FOLATE:2,FERRITIN:2,TIBC:2,IRON:2,RETICCTPCT:2 in the last 72 hours    EKG:  The patient is currently in rapid A. fib on telemetry. She has a history of a right bundle branch block seen on previous ECG.  ASSESSMENT AND PLAN:   Ms. Rossano has symptomatic rapid atrial fibrillation. It is felt that an antiarrhythmic will help her maintain sinus rhythm. IV digoxin was given in an effort to convert her but was unsuccessful. Amiodarone was initiated but no bolus was given because her systolic blood pressure was so low. Because her systolic blood pressure was low and the patient is symptomatic, anesthesia is consulted to assist in cardioversion. Dr. Antoine Poche was present for this.  Signed: Theodore Demark 09/19/2011, 9:48 AM   Above note was actually written by Theodore Demark by PA. I have seen, examined the patient, and reviewed the above assessment and plan.  ICD interrogated at therapies programmed off due to ICD shock for afib with RVR. Afib is postoperative and will hopefully resolve. Continue a short course of amiodarone. Will turn ICD therapies back on prior to discharge.  I will follow.   Co Sign: Hillis Range, MD 09/19/2011 5:46 PM

## 2011-09-19 NOTE — Anesthesia Procedure Notes (Addendum)
Procedure Name: Intubation Date/Time: 09/19/2011 9:51 AM Performed by: Elizbeth Squires Pre-anesthesia Checklist: Patient identified, Emergency Drugs available, Suction available, Patient being monitored and Timeout performed Patient Re-evaluated:Patient Re-evaluated prior to inductionOxygen Delivery Method: Simple face mask Preoxygenation: Pre-oxygenation with 100% oxygen Intubation Type: IV induction, Rapid sequence and Circoid Pressure applied Laryngoscope Size: Mac and 4 Grade View: Grade I Tube size: 7.5 mm Number of attempts: 1 Airway Equipment and Method: stylet Placement Confirmation: ETT inserted through vocal cords under direct vision,  CO2 detector and breath sounds checked- equal and bilateral Secured at: 23 cm Tube secured with: Tape Dental Injury: Teeth and Oropharynx as per pre-operative assessment

## 2011-09-19 NOTE — Progress Notes (Signed)
I was called into pts room on the monitor she was having a run of V tach and her ICD fired.  Pt was sweaty and said she felt dizzy.  She was also very pale.  Gina collins PA for CVTS was on the floor and she came to the room.  We paged Dr Johney Frame who was in a procedure so Amber his RN came down and turn her ICD off.  PTs Bp was low 80/60.  We put patient in the bed set up Frequent Vital Signs and placed her on 2 liters of o2.  Dr Gayland Curry was on the floor he ordered a DCCV.  Pts bp dropeed to 50/20 doppler in the left arm.  Her heart rate was in the 200's.  She was intubated and cardioverted and went in to NSR.  Report was called to 2106 and pt was extubated and transferred to 2100 at 1015

## 2011-09-19 NOTE — Progress Notes (Signed)
Name: [name] MRN: [id number] DOB: 05/31/1951  ELECTRONIC ICU PHYSICIAN NOTE  Problem:  Sustained ventricular fibrillation, maintains BP, awake, alert.  Intervention:  Amiodarone, magnesium sulfate given, Dr. Kendrick Fries asked to evaluate at bedside.  Elroy Schembri 09/19/2011, 7:20 PM

## 2011-09-19 NOTE — Progress Notes (Signed)
Pt had 5 beat runs of vtach, vs stable, called md and made aware. No new orders given.

## 2011-09-19 NOTE — Progress Notes (Signed)
Patient ID: [name], female   DOB: 03-03-1951, 60 y.o.   MRN: [id number] I was called to patient's bedside by [facility] for VTach.  Upon my arrival the patient was in sinus rhythm and laughing with staff.  Her blood pressure was 107/70.  Apparently she went into VTach which responded well to one dose of Amiodarone 150mg  IV bolus in addition to the continuous infusion.  She is currently asymptomatic.    The nurses are notifying cardiology and CT surgery.  I have ordered a BMET and 12 lead EKG for their benefit.  If either service wishes we can perform a full consult.

## 2011-09-19 NOTE — Significant Event (Signed)
Pt V-tach with HR sustained in180s, symptomatic, pacer pads place on pt; CCM,CVTS,& cardiology contacted. CCM response- 2 grams magnesium IV given along with 150mg  Amnioderone bolus. Pt converted to sinus rhythm. Reconverted into V-tach momentarily, then back to sustained sinus rhythm. EKG done. BMET sent. Cardiology MD currently at bedside.

## 2011-09-19 NOTE — Progress Notes (Signed)
3 Days Post-Op Procedure(s) (LRB): THORACOTOMY MAJOR (Left)                         [address]            [place] [address]          [phone]     Subjective: Patient was shocked by AICD for rapid AFIB Currently NSR on AMIO  Objective: Vital signs in last 24 hours: Temp:  [97.8 F (36.6 C)-98.4 F (36.9 C)] 98.2 F (36.8 C) (11/19 1600) Pulse Rate:  [73-98] 75  (11/19 1700) Cardiac Rhythm:  [-] Normal sinus rhythm (11/19 1700) Resp:  [11-21] 18  (11/19 1700) BP: (81-124)/(40-80) 116/78 mmHg (11/19 1700) SpO2:  [50 %-100 %] 98 % (11/19 1700) FiO2 (%):  [50 %-100 %] 50 % (11/19 1300) Weight:  [247 lb 4.8 oz (112.175 kg)-249 lb 12.5 oz (113.3 kg)] 249 lb 12.5 oz (113.3 kg) (11/19 1033)  Hemodynamic parameters for last 24 hours:  stable  Intake/Output from previous day: 11/18 0701 - 11/19 0700 In: 730 [P.O.:480; I.V.:200; IV Piggyback:50] Out: 1020 [Urine:1020] Intake/Output this shift: Total I/O In: 891.9 [P.O.:592; I.V.:299.9] Out: 300 [Urine:300]  EXAM  Lungs clear,thorocotomy incision healing  Lab Results:  Basename 09/19/11 0525 09/18/11 0454  WBC 9.0 12.0*  HGB 12.1 11.0*  HCT 37.0 33.7*  PLT 119* 108*   BMET:  Basename 09/19/11 0525 09/18/11 0454  NA 136 136  K 4.7 3.6  CL 98 98  CO2 30 31  GLUCOSE 105* 136*  BUN 10 11  CREATININE 0.78 0.70  CALCIUM 9.9 9.5    PT/INR: No results found for this basename: LABPROT,INR in the last 72 hours ABG    Component Value Date/Time   PHART 7.366 09/17/2011 0429   HCO3 31.0* 09/17/2011 0429   TCO2 33 09/17/2011 0429   O2SAT 96.0 09/17/2011 0429   CBG (last 3)   Basename 09/19/11 1044 09/19/11 0525 09/18/11 2158  GLUCAP 165* 107* 98    Assessment/Plan: S/P Procedure(s) (LRB): THORACOTOMY MAJOR (Left) Continue po amiodorone at discharge,no coumadin unless afib recurrs   LOS: 3 days    VAN TRIGT III,PETER 09/19/2011

## 2011-09-19 NOTE — Progress Notes (Signed)
Patient [name], 60 year old white female, is in the process of moving of from room 2033 to 2106 ICU.  Her daughter feels anxiety over patient's declining health. Chaplain provided spiritual presence, conversation, and prayer to patient daughter and the nurse. Nurse and family expressed appreciation for Chaplain's presence. Follow-up as needed.

## 2011-09-19 NOTE — Anesthesia Preprocedure Evaluation (Signed)
Anesthesia Evaluation   Patient awake    Reviewed: Allergy & Precautions, H&P , NPO status , Patient's Chart, lab work & pertinent test results, reviewed documented beta blocker date and time   Airway  TM Distance: <3 FB     Dental  (+) Teeth Intact   Pulmonary shortness of breath and with exertion, sleep apnea ,          Cardiovascular hypertension, Pt. on medications +CHF     Neuro/Psych Depression  Neuromuscular disease    GI/Hepatic hiatal hernia, GERD-  ,  Endo/Other    Renal/GU      Musculoskeletal   Abdominal   Peds  Hematology   Anesthesia Other Findings   Reproductive/Obstetrics                           Anesthesia Physical Anesthesia Plan  ASA: IV and Emergent  Anesthesia Plan: General   Post-op Pain Management:    Induction: Cricoid pressure planned, Rapid sequence and Intravenous  Airway Management Planned: Oral ETT  Additional Equipment:   Intra-op Plan:   Post-operative Plan:   Informed Consent: I have reviewed the patients History and Physical, chart, labs and discussed the procedure including the risks, benefits and alternatives for the proposed anesthesia with the patient or authorized representative who has indicated his/her understanding and acceptance.   Dental advisory given and Only emergency history available  Plan Discussed with:   Anesthesia Plan Comments: (Pt unstable. SBP 60s - Dr. Antoine Poche at bedside.  Pt ate at 0830am.  Dr. Chaney Malling notified.  Decided to RSI intubate pt.  )        Anesthesia Quick Evaluation

## 2011-09-19 NOTE — Progress Notes (Signed)
Patient ID: [name], female   DOB: 16-Nov-1950, 60 y.o.   MRN: [id number] Early today the patient had rapid wide-complex tachycardia that was felt to be atrial fibrillation. Eventually she was cardioverted. She has been on amiodarone drip at 1/2 mg per minute. This evening she developed a wide complex tachycardia that is irregular. This lasted for 10 minutes. She has never had ventricular tachycardia. She received magnesium and a bolus of amiodarone 150 mg. She was in the arrhythmia for a total of about 10 minutes. She maintained her blood pressure and she mentating well through this. She is stable now.  I've spoken with Dr. Johney Frame. The plan will be to turn her amiodarone drip back up to 1 mg per minute. As outlined she has already received one bolus of 150 mg of IV amiodarone. We will also try to give her some IV Lopressor. Her blood pressure is relatively low. I will also try to give her a small dose of by mouth carvedilol.

## 2011-09-19 NOTE — Transfer of Care (Signed)
Immediate Anesthesia Transfer of Care Note  Patient: [name]  Procedure(s) Performed: * No procedures listed *  Patient Location: PACU and Nursing Unit  Anesthesia Type: General  Level of Consciousness: awake and oriented   Airway & Oxygen Therapy: Patient Spontanous Breathing and Patient connected to face mask oxygen  Post-op Assessment: Report given to PACU RN, Post -op Vital signs reviewed and stable and Patient moving all extremities  Post vital signs: Reviewed and stable  Complications: No apparent anesthesia complications

## 2011-09-19 NOTE — Progress Notes (Addendum)
3 Days Post-Op Procedure(s) (LRB): THORACOTOMY MAJOR (Left)  Subjective: Pt was receiving a breathing tx when she developed V Tach and became lightheaded.  ICD fired x 1, and pt to paced rhythm.  Had been doing well prior to this incident, except noted to have 1 brief run of VT overnight.    Objective: Vital signs in last 24 hours: Patient Vitals for the past 24 hrs:  BP Temp Temp src Pulse Resp SpO2 Weight  09/19/11 0828 - - - - - 92 % -  09/19/11 0500 - - - - - - 247 lb 4.8 oz (112.175 kg)  09/19/11 0245 - - - - - 97 % -  09/18/11 2231 102/50 mmHg - - - - 99 % -  09/18/11 2221 98/48 mmHg - - - - - -  09/18/11 2005 - 98.2 F (36.8 C) Oral 88  18  98 % -  09/18/11 1845 93/60 mmHg 97.8 F (36.6 C) Oral 88  20  95 % -  09/18/11 1700 100/54 mmHg - - 77  14  97 % -  09/18/11 1600 100/52 mmHg 97.7 F (36.5 C) Oral 80  12  97 % -  09/18/11 1500 112/61 mmHg - - 80  14  97 % -  09/18/11 1400 84/48 mmHg - - 80  10  96 % -  09/18/11 1300 102/45 mmHg - - 83  13  96 % -  09/18/11 1200 105/60 mmHg 98.3 F (36.8 C) Oral 88  19  97 % -  09/18/11 1100 107/55 mmHg - - 83  16  95 % -  09/18/11 1000 101/49 mmHg - - 74  10  95 % -  09/18/11 0944 - - - - - 95 % -  09/18/11 0900 113/58 mmHg - - 89  19  96 % -   Current Weight  09/19/11 247 lb 4.8 oz (112.175 kg)        Intake/Output from previous day: 11/18 0701 - 11/19 0700 In: 730 [P.O.:480; I.V.:200; IV Piggyback:50] Out: 1020 [Urine:1020]     PHYSICAL EXAM:  Heart: RRR, paced Lungs: slightly decreased BS in bases Wound: dressed and dry   Lab Results: CBC: Basename 09/19/11 0525 09/18/11 0454  WBC 9.0 12.0*  HGB 12.1 11.0*  HCT 37.0 33.7*  PLT 119* 108*   BMET:  Basename 09/19/11 0525 09/18/11 0454  NA 136 136  K 4.7 3.6  CL 98 98  CO2 30 31  GLUCOSE 105* 136*  BUN 10 11  CREATININE 0.78 0.70  CALCIUM 9.9 9.5    Chest x-ray: basilar atelectasis , small effusions R>L  Assessment/Plan: S/P Procedure(s)  (LRB): THORACOTOMY MAJOR (Left), placement of epicardial leads  VT this am, ICD fired x 1.  Will notify cardiology.  K+ stable, being supplemented.    Doing well from surgery standpoint.  Will d/c On-Q today.  Continue pulm toilet , ambulate as tolerated.       LOS: 3 days    Hulon Ferron H 09/19/2011

## 2011-09-19 NOTE — Progress Notes (Signed)
*  PRELIMINARY RESULTS* Echocardiogram 2D Echocardiogram has been performed.  Glean Salen Antonio RDCS 09/19/2011, 3:12 PM

## 2011-09-19 NOTE — Progress Notes (Signed)
Cardiology was called to see the patient because of atrial fibrillation with rapid ventricular response and hypotension. Please see the complete consult note.   [name] was given one dose of IV digoxin 0.25 mg. She remained in rapid A. fib. Amiodarone was initiated and 60 mg per hour. No bolus was used because of her low blood pressure. Her systolic blood pressure was ranging from the 50s to 80s.   Dr. Antoine Poche was present and felt that cardioversion was indicated.   Anesthesia came and intubated the patient.   She was successfully cardioverted with one biphasic shock at 200 J. Post cardioversion, her systolic blood pressure improved to the 130s. She is to be continued on the amiodarone. We will give one bolus since her blood pressure has improved. Her EKG was checked and she is currently in sinus rhythm with biventricular pacing at a rate of 116.  She will be transferred to step down and monitored closely. We will continue to follow her there. Because of the initiation of amiodarone we will check thyroid function and liver function.

## 2011-09-19 NOTE — Anesthesia Postprocedure Evaluation (Signed)
  Anesthesia Post-op Note  Patient: [name]  Procedure(s) Performed: * No procedures listed *  Patient Location: PACU and Nursing Unit  Anesthesia Type: General  Level of Consciousness: awake and oriented  Airway and Oxygen Therapy: Patient Spontanous Breathing, Patient connected to face mask oxygen and non-rebreather face mask  Post-op Pain: none  Post-op Assessment: Post-op Vital signs reviewed, Patient's Cardiovascular Status Stable, Respiratory Function Stable, Patent Airway and No signs of Nausea or vomiting  Post-op Vital Signs: Reviewed and stable  Complications: No apparent anesthesia complications

## 2011-09-19 NOTE — Progress Notes (Signed)
As above. [name]  11:17 AM  09/19/2011

## 2011-09-19 NOTE — Addendum Note (Signed)
Addendum  created 09/19/11 1112 by Elizbeth Squires   Modules edited:Anesthesia Flowsheet, Anesthesia Medication Administration, Anesthesia Responsible Staff

## 2011-09-19 NOTE — Progress Notes (Addendum)
Patient ID: [name], female   DOB: [birth year], 60 y.o.   MRN: [id number] I was called back to the patient's room again with more wide complex rhythm. The rate is approximately 100. Before this started the patient had received 2.5 mg of IV Lopressor. We used a small dose because her blood pressure is 90 systolic. At times it seems regular and at other times it is irregular. It is difficult to know if it is atrial fibrillation. The rate of 100 she tolerates it adequately. Her potassium drawn at 7 PM he is normal at 4.0. She received 2 g of magnesium.   She then spontaneously converted back to sinus rhythm. It is possible that when she has a rate greater than 80 that she develops the wider complex rhythm.  Willa Rough  I have spoken again with Dr. Johney Frame. The patient has a very wide underlying QRS. In fact when she by the paces her QRS is narrower than her underlying QRS. The likely scenario now is that the narrower complex we see when she is by the pacing. If she develops atrial fib at any increased rate she probably conducts with her very wide complex. We will follow it as long as the rate is controlled.

## 2011-09-20 ENCOUNTER — Inpatient Hospital Stay (HOSPITAL_COMMUNITY): Payer: PRIVATE HEALTH INSURANCE

## 2011-09-20 ENCOUNTER — Encounter (HOSPITAL_COMMUNITY): Payer: Self-pay | Admitting: Cardiothoracic Surgery

## 2011-09-20 DIAGNOSIS — I4891 Unspecified atrial fibrillation: Secondary | ICD-10-CM | POA: Diagnosis not present

## 2011-09-20 DIAGNOSIS — I5022 Chronic systolic (congestive) heart failure: Secondary | ICD-10-CM

## 2011-09-20 LAB — BASIC METABOLIC PANEL
BUN: 7 mg/dL (ref 6–23)
CO2: 33 mEq/L — ABNORMAL HIGH (ref 19–32)
Calcium: 9.3 mg/dL (ref 8.4–10.5)
Chloride: 103 mEq/L (ref 96–112)
Creatinine, Ser: 0.6 mg/dL (ref 0.50–1.10)
GFR calc Af Amer: >90 mL/min (ref >90)
GFR calc non Af Amer: >90 mL/min (ref >90)
Glucose, Bld: 105 mg/dL — ABNORMAL HIGH (ref 70–99)
Potassium: 4.1 mEq/L (ref 3.5–5.1)
Sodium: 140 mEq/L (ref 135–145)

## 2011-09-20 LAB — CBC
HCT: 31.8 % — ABNORMAL LOW (ref 36.0–46.0)
Hemoglobin: 10.5 g/dL — ABNORMAL LOW (ref 12.0–15.0)
MCH: 30.8 pg (ref 26.0–34.0)
MCHC: 33 g/dL (ref 30.0–36.0)
MCV: 93.3 fL (ref 78.0–100.0)
Platelets: 108 10*3/uL — ABNORMAL LOW (ref 150–400)
RBC: 3.41 MIL/uL — ABNORMAL LOW (ref 3.87–5.11)
RDW: 13.9 % (ref 11.5–15.5)
WBC: 6.1 10*3/uL (ref 4.0–10.5)

## 2011-09-20 MED ORDER — AMIODARONE HCL 200 MG PO TABS
400.0000 mg | ORAL_TABLET | Freq: Two times a day (BID) | ORAL | Status: DC
  Administered 2011-09-20 (×2): 400 mg via ORAL
  Administered 2011-09-21: 200 mg via ORAL
  Administered 2011-09-21 – 2011-09-23 (×4): 400 mg via ORAL
  Filled 2011-09-20 (×8): qty 2

## 2011-09-20 MED ORDER — ASPIRIN 81 MG PO TBEC
81.0000 mg | DELAYED_RELEASE_TABLET | Freq: Every day | ORAL | Status: DC
  Administered 2011-09-20 – 2011-09-22 (×3): 81 mg via ORAL
  Filled 2011-09-20 (×2): qty 1

## 2011-09-20 MED ORDER — CARVEDILOL 6.25 MG PO TABS
6.2500 mg | ORAL_TABLET | Freq: Two times a day (BID) | ORAL | Status: DC
  Administered 2011-09-20: 3.125 mg via ORAL
  Administered 2011-09-20 – 2011-09-22 (×3): 6.25 mg via ORAL
  Filled 2011-09-20 (×8): qty 1

## 2011-09-20 MED ORDER — ASPIRIN 81 MG PO TBEC: 81.0000 mg | DELAYED_RELEASE_TABLET | Freq: Every day | ORAL | Status: DC

## 2011-09-20 NOTE — Progress Notes (Signed)
4 Days Post-Op Procedure(s) (LRB): THORACOTOMY MAJOR (Left)                       [address]            [place] [address]          [phone]       Subjective:No A-fib since last pm Objective: Vital signs in last 24 hours: Temp:  [97.6 F (36.4 C)-98.4 F (36.9 C)] 97.6 F (36.4 C) (11/20 0746) Pulse Rate:  [73-109] 76  (11/20 0900) Cardiac Rhythm:  [-] A-V Sequential paced (11/20 0900) Resp:  [11-23] 19  (11/20 0900) BP: (68-122)/(36-89) 104/57 mmHg (11/20 0900) SpO2:  [50 %-100 %] 97 % (11/20 0900) FiO2 (%):  [50 %-100 %] 50 % (11/19 1300) Weight:  [248 lb 7.3 oz (112.7 kg)-249 lb 12.5 oz (113.3 kg)] 248 lb 7.3 oz (112.7 kg) (11/20 0500)  Hemodynamic parameters for last 24 hours:  stable Intake/Output from previous day: 11/19 0701 - 11/20 0700 In: 1616.6 [P.O.:772; I.V.:844.6] Out: 3305 [Urine:3305] Intake/Output this shift: Total I/O In: 346.6 [P.O.:240; I.V.:106.6] Out: 30 [Urine:30]  EXAM   Lungs clear              Incisions clear              CXR clear  [facility] 09/20/11 0526 09/19/11 0525  WBC 6.1 9.0  HGB 10.5* 12.1  HCT 31.8* 37.0  PLT 108* 119*   BMET:  Basename 09/20/11 0526 09/19/11 1925  NA 140 133*  K 4.1 4.0  CL 103 97  CO2 33* 30  GLUCOSE 105* 257*  BUN 7 8  CREATININE 0.60 0.64  CALCIUM 9.3 9.1    PT/INR: No results found for this basename: LABPROT,INR in the last 72 hours ABG    Component Value Date/Time   PHART 7.366 09/17/2011 0429   HCO3 31.0* 09/17/2011 0429   TCO2 33 09/17/2011 0429   O2SAT 96.0 09/17/2011 0429   CBG (last 3)   Basename 09/19/11 1044 09/19/11 0525 09/18/11 2158  GLUCAP 165* 107* 98    Assessment/Plan: S/P Procedure(s) (LRB): THORACOTOMY MAJOR (Left) Plan for transfer to step-down: see transfer orders No coumadin for now  Advance activity level   LOS: 4 days    VAN TRIGT III,PETER 09/20/2011

## 2011-09-20 NOTE — Progress Notes (Signed)
SUBJECTIVE: The patient is doing well today.  She had afib/ aflutter with RVR last night, but has maintained sinus with amiodarone subsequently.  At this time, she denies chest pain, shortness of breath, or any new concerns.     [name] amiodarone  150 mg Intravenous Once  . aspirin  81 mg Oral Daily  . bimatoprost  1 drop Both Eyes QHS  . bisacodyl  10 mg Oral Daily  . carvedilol  3.125 mg Oral BID WC  . digoxin  0.25 mg Intravenous Daily  . furosemide  60 mg Oral BID  . letrozole  2.5 mg Oral QPM  . levalbuterol  0.63 mg Nebulization Q6H  . magnesium sulfate IVPB  1 g Intravenous Once  . metoprolol      . metoprolol      . metoprolol  5 mg Intravenous Once  . montelukast  10 mg Oral QHS  . naphazoline-pheniramine  1 drop Both Eyes Once  . niacin  1,000 mg Oral QHS  . pantoprazole  40 mg Oral Q1200  . PARoxetine  10 mg Oral QAM  . potassium chloride SA  40 mEq Oral BID  . simvastatin  20 mg Oral QHS  . sodium chloride      . sodium chloride      . sodium chloride      . DISCONTD: magnesium sulfate  1 g Intravenous Once      . sodium chloride 20 mL/hr at 09/20/11 0900  . amiodarone (CORDARONE) infusion 60 mg/hr (09/20/11 0900)  . DISCONTD: amiodarone (CORDARONE) infusion 30 mg/hr (09/19/11 0928)    OBJECTIVE: Physical Exam: Filed Vitals:   09/20/11 0700 09/20/11 0746 09/20/11 0800 09/20/11 0900  BP: 115/55  122/59 104/57  Pulse: 77  79 76  Temp:  97.6 F (36.4 C)    TempSrc:  Oral    Resp: 14  19 19   Height:      Weight:      SpO2: 98%  96% 97%    Intake/Output Summary (Last 24 hours) at 09/20/11 0932 Last data filed at 09/20/11 0900  Gross per 24 hour  Intake 1817.9 ml  Output   3335 ml  Net -1517.1 ml    Telemetry reveals sinus rhythm this am,  afib with LBBB and RVR last night  GEN- The patient is well appearing, alert and oriented x 3 today.   Head- normocephalic, atraumatic Eyes-  Sclera clear, conjunctiva pink Ears- hearing intact Oropharynx-  clear Neck- supple, R neck CVL in place Chest- ICD pocket without hematoma Lungs- Clear to ausculation bilaterally, normal work of breathing Heart- Regular rate and rhythm, no murmurs, rubs or gallops, PMI not laterally displaced GI- soft, NT, ND, + BS Extremities- no clubbing, cyanosis, or edema  LABS: Basic Metabolic Panel:  Basename 09/20/11 0526 09/19/11 1925  NA 140 133*  K 4.1 4.0  CL 103 97  CO2 33* 30  GLUCOSE 105* 257*  BUN 7 8  CREATININE 0.60 0.64  CALCIUM 9.3 9.1  MG -- 3.7*  PHOS -- --   Liver Function Tests:  [facility] 09/19/11 1320 09/18/11 0454  AST 33 33  ALT 20 16  ALKPHOS 92 82  BILITOT 0.5 0.4  PROT 6.4 6.4  ALBUMIN 2.8* 3.1*   CBC:  Basename 09/20/11 0526 09/19/11 0525  WBC 6.1 9.0  NEUTROABS -- --  HGB 10.5* 12.1  HCT 31.8* 37.0  MCV 93.3 94.1  PLT 108* 119*   Basename 09/19/11 1320  TSH 1.201  T4TOTAL --  T3FREE --  THYROIDAB --   RADIOLOGY: Dg Chest 2 View  09/19/2011  *RADIOLOGY REPORT*  Clinical Data: Post heart surgery, now with shortness of breath and chest soreness  CHEST - 2 VIEW  Comparison: 09/18/2011; 09/17/2011; 09/16/2011  Findings:  Unchanged enlarged cardiac silhouette and mediastinal contours. Stable position of support apparatus.  No pneumothorax.  Grossly unchanged minimal bibasilar opacities, left greater than right. There is persistent blunting of the right costophrenic angle suggestive of a small pleural effusion.  Grossly unchanged bones. Clips overlie the bilateral breasts.  IMPRESSION: Unchanged minimal basilar atelectasis and likely trace right-sided effusion.  Original Report Authenticated By: Waynard Reeds, M.D.   Echo 09/19/11 reviewed EF 15%, no effusion ICD interrogated this am and confirms afib/ atrial flutter with RVR as arrhythmias yesterday,  Tachy therapies remain off at this time.  ASSESSMENT AND PLAN:  1.  Afib with RVR     - decrease IV amiodarone to 30mg /hour     - start PO amiodarone 400mg  BID      - increase coreg to 6.25mg  BID and continue to titrate as BP allows     - hold off on coumadin for now but will decide about long term anticoagulation based on future afib burden  2.  Chronic systolic dysfunction-  EF remains 15%     - hopefully she will improve with CRT     - increase coreg as above     - BiV pacing s/p LV lead by Dr Donata Clay,  TCTS to manage post operative issues  To step down today  Hillis Range, MD 09/20/2011 9:32 AM

## 2011-09-21 ENCOUNTER — Inpatient Hospital Stay (HOSPITAL_COMMUNITY): Payer: PRIVATE HEALTH INSURANCE

## 2011-09-21 LAB — BASIC METABOLIC PANEL
BUN: 7 mg/dL (ref 6–23)
CO2: 34 mEq/L — ABNORMAL HIGH (ref 19–32)
Calcium: 9.4 mg/dL (ref 8.4–10.5)
Chloride: 100 mEq/L (ref 96–112)
Creatinine, Ser: 0.7 mg/dL (ref 0.50–1.10)
GFR calc Af Amer: >90 mL/min (ref >90)
GFR calc non Af Amer: >90 mL/min (ref >90)
Glucose, Bld: 111 mg/dL — ABNORMAL HIGH (ref 70–99)
Potassium: 3.6 mEq/L (ref 3.5–5.1)
Sodium: 141 mEq/L (ref 135–145)

## 2011-09-21 LAB — CBC
HCT: 33.2 % — ABNORMAL LOW (ref 36.0–46.0)
Hemoglobin: 10.9 g/dL — ABNORMAL LOW (ref 12.0–15.0)
MCH: 30.4 pg (ref 26.0–34.0)
MCHC: 32.8 g/dL (ref 30.0–36.0)
MCV: 92.5 fL (ref 78.0–100.0)
Platelets: 129 10*3/uL — ABNORMAL LOW (ref 150–400)
RBC: 3.59 MIL/uL — ABNORMAL LOW (ref 3.87–5.11)
RDW: 13.7 % (ref 11.5–15.5)
WBC: 6.7 10*3/uL (ref 4.0–10.5)

## 2011-09-21 MED ORDER — BIMATOPROST 0.03 % OP SOLN
1.0000 [drp] | Freq: Every day | OPHTHALMIC | Status: DC
  Administered 2011-09-21 – 2011-09-22 (×2): 1 [drp] via OPHTHALMIC

## 2011-09-21 MED ORDER — SODIUM CHLORIDE 0.9 % IJ SOLN
10.0000 mL | INTRAMUSCULAR | Status: DC | PRN
  Administered 2011-09-21 – 2011-09-22 (×2): 10 mL

## 2011-09-21 MED ORDER — POTASSIUM CHLORIDE CRYS ER 20 MEQ PO TBCR
40.0000 meq | EXTENDED_RELEASE_TABLET | Freq: Every day | ORAL | Status: DC
  Administered 2011-09-22 – 2011-09-23 (×2): 40 meq via ORAL
  Filled 2011-09-21 (×2): qty 2

## 2011-09-21 MED ORDER — FUROSEMIDE 40 MG PO TABS
40.0000 mg | ORAL_TABLET | Freq: Every day | ORAL | Status: DC
  Filled 2011-09-21 (×2): qty 1

## 2011-09-21 MED ORDER — CARVEDILOL 12.5 MG PO TABS
12.5000 mg | ORAL_TABLET | Freq: Two times a day (BID) | ORAL | Status: DC
  Administered 2011-09-21 – 2011-09-23 (×4): 12.5 mg via ORAL
  Filled 2011-09-21 (×8): qty 1

## 2011-09-21 NOTE — Progress Notes (Signed)
SUBJECTIVE: The patient is doing well today.  She has had no further arrhythmias.  At this time, she denies chest pain, shortness of breath, or any new concerns.     [name] amiodarone  400 mg Oral BID  . aspirin  81 mg Oral QHS  . bimatoprost  1 drop Both Eyes QHS  . bisacodyl  10 mg Oral Daily  . carvedilol  6.25 mg Oral BID WC  . digoxin  0.25 mg Intravenous Daily  . furosemide  60 mg Oral BID  . letrozole  2.5 mg Oral QPM  . levalbuterol  0.63 mg Nebulization Q6H  . metoprolol      . montelukast  10 mg Oral QHS  . naphazoline-pheniramine  1 drop Both Eyes Once  . niacin  1,000 mg Oral QHS  . pantoprazole  40 mg Oral Q1200  . PARoxetine  10 mg Oral QAM  . potassium chloride SA  40 mEq Oral BID  . simvastatin  20 mg Oral QHS  . DISCONTD: aspirin  81 mg Oral Daily  . DISCONTD: aspirin  81 mg Oral QHS  . DISCONTD: carvedilol  3.125 mg Oral BID WC  . DISCONTD: magnesium sulfate IVPB  1 g Intravenous Once      . sodium chloride 20 mL/hr at 09/20/11 2000  . amiodarone (CORDARONE) infusion 30 mg/hr (09/21/11 0200)    OBJECTIVE: Physical Exam: Filed Vitals:   09/21/11 0400 09/21/11 0500 09/21/11 0600 09/21/11 0700  BP: 105/79 115/86 110/77 113/55  Pulse: 74 74 74 78  Temp:      TempSrc:      Resp: 15 14 15 18   Height:      Weight:      SpO2: 99% 97% 95% 98%    Intake/Output Summary (Last 24 hours) at 09/21/11 0727 Last data filed at 09/21/11 0600  Gross per 24 hour  Intake 1837.1 ml  Output   3211 ml  Net -1373.9 ml    Telemetry reveals sinus rhythm    GEN- The patient is well appearing, alert and oriented x 3 today.   Head- normocephalic, atraumatic Eyes-  Sclera clear, conjunctiva pink Ears- hearing intact Oropharynx- clear Neck- supple, R neck CVL in place Chest- ICD pocket without hematoma Lungs- Clear to ausculation bilaterally, normal work of breathing Heart- Regular rate and rhythm, no murmurs, rubs or gallops, PMI not laterally displaced GI- soft, NT,  ND, + BS Extremities- no clubbing, cyanosis, or edema  LABS: Basic Metabolic Panel:  Basename 09/21/11 0530 09/20/11 0526 09/19/11 1925  NA 141 140 --  K 3.6 4.1 --  CL 100 103 --  CO2 34* 33* --  GLUCOSE 111* 105* --  BUN 7 7 --  CREATININE 0.70 0.60 --  CALCIUM 9.4 9.3 --  MG -- -- 3.7*  PHOS -- -- --   Liver Function Tests:  Basename 09/19/11 1320  AST 33  ALT 20  ALKPHOS 92  BILITOT 0.5  PROT 6.4  ALBUMIN 2.8*   CBC:  Basename 09/21/11 0530 09/20/11 0526  WBC 6.7 6.1  NEUTROABS -- --  HGB 10.9* 10.5*  HCT 33.2* 31.8*  MCV 92.5 93.3  PLT 129* 108*    Basename 09/19/11 1320  TSH 1.201  T4TOTAL --  T3FREE --  THYROIDAB --     Echo 09/19/11 reviewed EF 15%, no effusion ICD interrogated this am and confirms afib/ atrial flutter with RVR as arrhythmias yesterday,  Tachy therapies remain off at this time.  ASSESSMENT AND PLAN:  1.  Afib with RVR     - continue PO amiodarone 400mg  BID     - increase coreg to 12.5mg  BID and continue to titrate as BP allows     - hold off on coumadin for now but will decide about long term anticoagulation based on future afib burden  2.  Chronic systolic dysfunction-  EF remains 15%     - hopefully she will improve with CRT     - increase coreg as above     - BiV pacing s/p LV lead by Dr Donata Clay,  TCTS to manage post operative issues     - will likely turn tachy detections back on later today if afib remains controlled and she is tolerating higher doses of coreg  To telemetry today Hope to discharge in next 24-48 hours  Hillis Range, MD 09/21/2011 7:27 AM

## 2011-09-21 NOTE — Progress Notes (Signed)
5 Days Post-Op Procedure(s) (LRB): THORACOTOMY MAJOR (Left) Subjective: In good spirits Pain well controlled' Rhythm stable  Objective: Vital signs in last 24 hours: Temp:  [97.9 F (36.6 C)-99.6 F (37.6 C)] 97.9 F (36.6 C) (11/21 1452) Pulse Rate:  [72-78] 75  (11/21 1452) Cardiac Rhythm:  [-] A-V Sequential paced (11/21 0900) Resp:  [13-24] 18  (11/21 1452) BP: (81-119)/(46-86) 104/73 mmHg (11/21 1452) SpO2:  [93 %-100 %] 93 % (11/21 1452) Weight:  [108.6 kg (239 lb 6.7 oz)] 239 lb 6.7 oz (108.6 kg) (11/21 1120)  Hemodynamic parameters for last 24 hours:    Intake/Output from previous day: 11/20 0701 - 11/21 0700 In: 1837.1 [P.O.:1100; I.V.:737.1] Out: 3211 [Urine:3210; Stool:1] Intake/Output this shift: Total I/O In: 620 [P.O.:600; I.V.:20] Out: 150 [Urine:150]  General appearance: alert and no distress Heart: regular rate and rhythm Lungs: clear to auscultation bilaterally Wound: clean and dry  Lab Results:  [facility] 09/21/11 0530 09/20/11 0526  WBC 6.7 6.1  HGB 10.9* 10.5*  HCT 33.2* 31.8*  PLT 129* 108*   BMET:  Basename 09/21/11 0530 09/20/11 0526  NA 141 140  K 3.6 4.1  CL 100 103  CO2 34* 33*  GLUCOSE 111* 105*  BUN 7 7  CREATININE 0.70 0.60  CALCIUM 9.4 9.3    PT/INR: No results found for this basename: LABPROT,INR in the last 72 hours ABG    Component Value Date/Time   PHART 7.366 09/17/2011 0429   HCO3 31.0* 09/17/2011 0429   TCO2 33 09/17/2011 0429   O2SAT 96.0 09/17/2011 0429   CBG (last 3)   Basename 09/19/11 1044 09/19/11 0525 09/18/11 2158  GLUCAP 165* 107* 98    Assessment/Plan: S/P Procedure(s) (LRB): THORACOTOMY MAJOR (Left) Rhythm stable Continue ambulation, diuresis   LOS: 5 days    Leonarda Leis C 09/21/2011

## 2011-09-22 LAB — CBC
Hemoglobin: 11.2 g/dL — ABNORMAL LOW (ref 12.0–15.0)
MCH: 30.1 pg (ref 26.0–34.0)
Platelets: 142 10*3/uL — ABNORMAL LOW (ref 150–400)
RBC: 3.72 MIL/uL — ABNORMAL LOW (ref 3.87–5.11)
WBC: 5.5 10*3/uL (ref 4.0–10.5)

## 2011-09-22 LAB — GLUCOSE, CAPILLARY: Glucose-Capillary: 109 mg/dL — ABNORMAL HIGH (ref 70–99)

## 2011-09-22 LAB — BASIC METABOLIC PANEL
Calcium: 9.4 mg/dL (ref 8.4–10.5)
GFR calc Af Amer: >90 mL/min (ref >90)
GFR calc non Af Amer: 89 mL/min — ABNORMAL LOW (ref >90)
Potassium: 3.9 mEq/L (ref 3.5–5.1)
Sodium: 140 mEq/L (ref 135–145)

## 2011-09-22 MED ORDER — SODIUM CHLORIDE 0.9 % IJ SOLN
10.0000 mL | INTRAMUSCULAR | Status: DC | PRN
  Administered 2011-09-22 (×3): 10 mL

## 2011-09-22 MED ORDER — SODIUM CHLORIDE 0.9 % IJ SOLN: 10.0000 mL | Freq: Two times a day (BID) | INTRAMUSCULAR | Status: DC

## 2011-09-22 MED ORDER — FUROSEMIDE 40 MG PO TABS
60.0000 mg | ORAL_TABLET | Freq: Every day | ORAL | Status: DC
  Administered 2011-09-22 – 2011-09-23 (×2): 60 mg via ORAL
  Filled 2011-09-22 (×2): qty 1

## 2011-09-22 NOTE — Progress Notes (Signed)
Asked to start peripheral iv for pt ; limited to left arm only for ivs, labs; pt has right ij, placed 09-16-11; site wnl; no access noted in left arm; multiple bruises from previous iv/lab attempts; RN aware;  Has spoken with MD; will keep central access another day; pt says she "may go home Friday;"

## 2011-09-22 NOTE — Progress Notes (Signed)
SUBJECTIVE: The patient is doing well today.  She has had no further arrhythmias.  At this time, she denies chest pain, shortness of breath, or any new concerns.     [name] amiodarone  400 mg Oral BID  . aspirin  81 mg Oral QHS  . bimatoprost  1 drop Both Eyes QHS  . bisacodyl  10 mg Oral Daily  . carvedilol  12.5 mg Oral BID WC  . furosemide  60 mg Oral Daily  . letrozole  2.5 mg Oral QPM  . montelukast  10 mg Oral QHS  . naphazoline-pheniramine  1 drop Both Eyes Once  . niacin  1,000 mg Oral QHS  . pantoprazole  40 mg Oral Q1200  . PARoxetine  10 mg Oral QAM  . potassium chloride SA  40 mEq Oral Daily  . simvastatin  20 mg Oral QHS  . DISCONTD: bimatoprost  1 drop Both Eyes QHS  . DISCONTD: carvedilol  6.25 mg Oral BID WC  . DISCONTD: furosemide  40 mg Oral Daily  . DISCONTD: furosemide  60 mg Oral BID  . DISCONTD: levalbuterol  0.63 mg Nebulization Q6H  . DISCONTD: potassium chloride SA  40 mEq Oral BID      . sodium chloride 20 mL/hr at 09/20/11 2000    OBJECTIVE: Physical Exam: Filed Vitals:   09/21/11 1452 09/21/11 2029 09/21/11 2100 09/22/11 0500  BP: 104/73  92/55 136/93  Pulse: 75  75 73  Temp: 97.9 F (36.6 C)  98.2 F (36.8 C) 98.3 F (36.8 C)  TempSrc: Oral  Oral Oral  Resp: 18  16 16   Height:      Weight:      SpO2: 93% 93% 93% 94%    Intake/Output Summary (Last 24 hours) at 09/22/11 1610 Last data filed at 09/22/11 0600  Gross per 24 hour  Intake    620 ml  Output    400 ml  Net    220 ml    Telemetry reveals sinus rhythm    GEN- The patient is well appearing, alert and oriented x 3 today.   Head- normocephalic, atraumatic Eyes-  Sclera clear, conjunctiva pink Ears- hearing intact Oropharynx- clear Neck- supple, R neck CVL in place Chest- ICD pocket without hematoma Lungs- Clear to ausculation bilaterally, normal work of breathing Heart- Regular rate and rhythm, no murmurs, rubs or gallops, PMI not laterally displaced GI- soft, NT, ND, +  BS Extremities- no clubbing, cyanosis, or edema  LABS: Basic Metabolic Panel:  Basename 09/22/11 0600 09/21/11 0530 09/19/11 1925  NA 140 141 --  K 3.9 3.6 --  CL 100 100 --  CO2 33* 34* --  GLUCOSE 94 111* --  BUN 11 7 --  CREATININE 0.77 0.70 --  CALCIUM 9.4 9.4 --  MG -- -- 3.7*  PHOS -- -- --   Liver Function Tests:  Basename 09/19/11 1320  AST 33  ALT 20  ALKPHOS 92  BILITOT 0.5  PROT 6.4  ALBUMIN 2.8*   CBC:  Basename 09/22/11 0600 09/21/11 0530  WBC 5.5 6.7  NEUTROABS -- --  HGB 11.2* 10.9*  HCT 34.1* 33.2*  MCV 91.7 92.5  PLT 142* 129*    Basename 09/19/11 1320  TSH 1.201  T4TOTAL --  T3FREE --  THYROIDAB --     Echo 09/19/11 reviewed EF 15%, no effusion   ASSESSMENT AND PLAN:  1.  Afib with RVR- resolved     - continue PO amiodarone 400mg  BID, decrease to  200mg  BID upon discharge     - continue coreg to 12.5mg  BID      - would avoid coumadin at this time  2.  Chronic systolic dysfunction-  EF remains 15%     - hopefully she will improve with CRT     - BiV pacing s/p LV lead by Dr Donata Clay,  TCTS to manage post operative issues     - ICD VF therapies turned back on yesterday (single VF zone 240bpm)  Ambulate today DC CVL Hope to discharge tomorrow  Hillis Range, MD 09/22/2011 8:32 AM

## 2011-09-22 NOTE — Progress Notes (Signed)
6 Days Post-Op Procedure(s) (LRB): THORACOTOMY MAJOR (Left) Subjective: Feels well, ambulating. Pain controlled  Objective: Vital signs in last 24 hours: Temp:  [97.9 F (36.6 C)-98.3 F (36.8 C)] 98.3 F (36.8 C) (11/22 0500) Pulse Rate:  [73-75] 73  (11/22 0500) Cardiac Rhythm:  [-] A-V Sequential paced (11/22 0821) Resp:  [16-18] 16  (11/22 0500) BP: (92-136)/(55-93) 136/93 mmHg (11/22 0500) SpO2:  [93 %-94 %] 94 % (11/22 0500)  Hemodynamic parameters for last 24 hours:    Intake/Output from previous day: 11/21 0701 - 11/22 0700 In: 620 [P.O.:600; I.V.:20] Out: 400 [Urine:400] Intake/Output this shift: Total I/O In: -  Out: 1350 [Urine:1350]  General appearance: alert and cooperative Heart: regular rate and rhythm, S1, S2 normal, no murmur, click, rub or gallop Lungs: clear to auscultation bilaterally Wound: incision ok  Lab Results:  Basename 09/22/11 0600 09/21/11 0530  WBC 5.5 6.7  HGB 11.2* 10.9*  HCT 34.1* 33.2*  PLT 142* 129*   BMET:  Basename 09/22/11 0600 09/21/11 0530  NA 140 141  K 3.9 3.6  CL 100 100  CO2 33* 34*  GLUCOSE 94 111*  BUN 11 7  CREATININE 0.77 0.70  CALCIUM 9.4 9.4    PT/INR: No results found for this basename: LABPROT,INR in the last 72 hours ABG    Component Value Date/Time   PHART 7.366 09/17/2011 0429   HCO3 31.0* 09/17/2011 0429   TCO2 33 09/17/2011 0429   O2SAT 96.0 09/17/2011 0429   CBG (last 3)   Basename 09/22/11 1126  GLUCAP 109*    Assessment/Plan: S/P Procedure(s) (LRB): THORACOTOMY MAJOR (Left) Continue present course per cardiology.  Doing well from surgical standpoint.   LOS: 6 days    Jeffey Janssen K 09/22/2011

## 2011-09-23 DIAGNOSIS — I428 Other cardiomyopathies: Secondary | ICD-10-CM

## 2011-09-23 MED ORDER — TRAMADOL HCL 50 MG PO TABS: 50.0000 mg | ORAL_TABLET | Freq: Four times a day (QID) | ORAL | Status: AC | PRN

## 2011-09-23 MED ORDER — OXYCODONE-ACETAMINOPHEN 5-325 MG PO TABS: 1.0000 | ORAL_TABLET | ORAL | Status: DC | PRN

## 2011-09-23 MED ORDER — CARVEDILOL 12.5 MG PO TABS: 12.5000 mg | ORAL_TABLET | Freq: Two times a day (BID) | ORAL | Status: DC

## 2011-09-23 MED ORDER — AMIODARONE HCL 200 MG PO TABS: 200.0000 mg | ORAL_TABLET | ORAL | Status: DC

## 2011-09-23 MED ORDER — POTASSIUM CHLORIDE CRYS ER 20 MEQ PO TBCR: 40.0000 meq | EXTENDED_RELEASE_TABLET | Freq: Every day | ORAL | Status: DC

## 2011-09-23 MED ORDER — FUROSEMIDE 40 MG PO TABS: 60.0000 mg | ORAL_TABLET | Freq: Every day | ORAL | Status: DC

## 2011-09-23 MED ORDER — OXYCODONE-ACETAMINOPHEN 5-325 MG PO TABS: 1.0000 | ORAL_TABLET | ORAL | Status: AC | PRN

## 2011-09-23 NOTE — Progress Notes (Signed)
I discharged [name]. [name]. She received information on current medications she was previously taking at home, as well as new medications she would be starting.  She received information on care after her thoracotomy. She also received prescriptions for new medications she would need to pick up from the pharmacy.  She also received information on community resources, and activity guidelines. She stated that everything was understood, and any questions she had I did address.

## 2011-09-23 NOTE — Discharge Summary (Signed)
Physician Discharge Summary  Patient ID: [name],  MRN: [id number], DOB/AGE: 12/20/1950 60 y.o.  Admit date: 09/16/2011 Discharge date: 09/23/2011  Primary Discharge Diagnosis:  1. Nonischemic cardiomyopathy     - admission 12/11 with EF 25%, normal coronary arteries by cath 12/11, EF 15-20% by echo 05/2011; NICM presumed to be secondary to chemotherapy for breast cancer   - s/p St. Jude ICD placement 07/2011 with unsuccessful LV lead placement - s/p left thoractomy and successful epicardial LV leads placement this admission by Dr. Zenaida Niece Tright 2. LBBB 3. Transient atrial fibrillation with RVR (resulting in inappropriate ICD discharge)  Secondary Discharge Diagnosis:  1.  Hyperlipidemia  2. 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 Obesity 4. Hypertension     5. Glaucoma     6.  Depression     7. Breast cancer  07/2009 (Stage 3) s/p lumpectomy, radiation, chemotherapy; finished chemo October, felt to be in remission   8. History of shingles     9. DVT (deep venous thrombosis)  10/18/2010      10. BTL and single oopherectomy 11. Tonsillectomy   Hospital Course: 60 y/o F with hx of NICM, LBBB who presented to [facility] for planned epicardial lead placement. In 07/2011, she underwent St. Jude BiV-ICD placement. At that time, however, left ventricular lead placement was unsuccessful due to the very small posterolateral branches which were not amenable to lead placement.  Dr. Johney Frame placed a biventricular ICD device with plans to have the patient return for epicardial LV lead placement. She was seen in consultation by [facility] and brought in 09/16/11 for this procedure. She ultimately had left anterior thoracotomy and revision of automatic implantable cardioverter defibrillator-biventricular pacemaker. The patient initially did well, however, on 09/19/11 she had an episode of tachycardia and subsequent defibrillation x 1. Interrogation of her ICD was reviewed by Dr. Johney Frame, who felt this  to represent rapid atrial fibrillation. Overnight she had also had several episodes of wide-complex tachycardia that were irregular and thus also felt to represent PAF as well. IV digoxin was given in an effort to convert her, but was unsuccessful. Amiodarone was initiated in the setting of hypotension with an SBP in the 50s-80s, and anesthesia was called. The patient was intubated and successfully cardioverted with one biphasic shock at 200J, and returned to an SBP of 130. 2D echo demonstrated below findings, with EF 15% and no significant interval change. On 09/19/11 evening, the patient went back into a WCT for which she received a bolus of 150mg  IV amiodarone - despite documentation that this may have been ventricular, this again was felt to be atrial fibrillation. She was started on beta blocker therapy and amiodarone was changed to po. Dr. Johney Frame made the decision to hold off on coumadin for now, but plans to decide about long-term anticoagulation based on future afib burden given that this all occurred post-op. On 11/21, her ICD VF therapies were turned back on with a single VF zone to 240bpm. On day of discharge, she is feeling well. Dr. Johney Frame has seen and examined her and feels she is stable for discharge. Please see below for his final medication recommendations.  Discharge Vitals: Blood pressure 124/77, pulse 73, temperature 98.2 F (36.8 C), temperature source Oral, resp. rate 16, height 5\' 2"  (1.575 m), weight 239 lb 6.7 oz (108.6 kg), SpO2 93.00%.  Labs: Lab Results  Component Value Date   WBC 5.5 09/22/2011   HGB 11.2* 09/22/2011   HCT 34.1*  09/22/2011   MCV 91.7 09/22/2011   PLT 142* 09/22/2011    Lab 09/22/11 0600 09/19/11 1320  NA 140 --  K 3.9 --  CL 100 --  CO2 33* --  BUN 11 --  CREATININE 0.77 --  CALCIUM 9.4 --  PROT -- 6.4  BILITOT -- 0.5  ALKPHOS -- 92  ALT -- 20  AST -- 33  GLUCOSE 94 --   Diagnostic Studies/Procedures:   1. 09/21/2011  CHEST - 2 VIEW   IMPRESSION:  1.  Cardiac enlargement without evidence for overt heart failure. (most recent)  2. 2D Echo 09/19/11 - - Left ventricle: There are a few linear targets at the apex that were present in July. Contrast was used then showing no clot. The cavity size was severely dilated. Wall thickness was increased in a pattern of mild LVH. The estimated ejection fraction was 15%. Diffuse hypokinesis. Doppler parameters are consistent with high ventricular filling pressure. - Left atrium: The atrium was mildly dilated. - Pulmonary arteries: PA peak pressure: 37mm Hg (S). - Impressions: Compared to the prior study, there has been no significant interval change.  Discharge Medications:  Current Discharge Medication List    START taking these medications   Details  amiodarone (PACERONE) 200 MG tablet Take 1 tablet (200 mg total) by mouth as directed. Take 1 tablet twice daily for 4 weeks. After that, decrease to 1 tablet daily. Qty: 60 tablet, Refills: 1    oxyCODONE-acetaminophen (PERCOCET) 5-325 MG per tablet Take 1-2 tablets by mouth every 4 (four) hours as needed (For severe pain. Do not drive while taking this medicine. Please be aware each tablet contains 325mg  of acetaminophen (Tylenol) so do not exceed 4,000mg  of acetaminophen in 24 hours. For moderate pain. Follow up with Dr. Zenaida Niece Trigt's office for this medicine.). Qty: 30 tablet, Refills: 0    traMADol (ULTRAM) 50 MG tablet Take 1-2 tablets (50-100 mg total) by mouth every 6 (six) hours as needed (For moderate pain. Follow up with Dr. Zenaida Niece Trigt's office for this medicine.). Maximum dose= 8 tablets per day Qty: 30 tablet, Refills: 0      CONTINUE these medications which have CHANGED   Details  carvedilol (COREG) 12.5 MG tablet Take 1 tablet (12.5 mg total) by mouth 2 (two) times daily with a meal. New dose/prescription. Qty: 60 tablet, Refills: 6   Associated Diagnoses: Secondary cardiomyopathy, unspecified    furosemide (LASIX) 40 MG  tablet Take 1.5 tablets (60 mg total) by mouth daily.   Associated Diagnoses: Edema    potassium chloride SA (K-DUR,KLOR-CON) 20 MEQ tablet Take 2 tablets (40 mEq total) by mouth daily.      CONTINUE these medications which have NOT CHANGED   Details  ALPRAZolam (XANAX) 1 MG tablet Take 1 mg by mouth as needed. Anxiety     omeprazole (PRILOSEC) 20 MG capsule Take 20 mg by mouth 2 (two) times daily.      PARoxetine (PAXIL) 10 MG tablet Take 10 mg by mouth every morning.      albuterol (PROVENTIL HFA;VENTOLIN HFA) 108 (90 BASE) MCG/ACT inhaler Inhale 2 puffs into the lungs every 4 (four) hours as needed. [name]     albuterol (PROVENTIL) (2.5 MG/3ML) 0.083% nebulizer solution Take 2.5 mg by nebulization as needed. [name]     aspirin 81 MG EC tablet Take 81 mg by mouth daily.      Bepotastine Besilate (BEPREVE) 1.5 % SOLN Place 1 drop into both eyes as  needed. Dry eyes     bimatoprost (LUMIGAN) 0.03 % ophthalmic solution Place 1 drop into both eyes at bedtime.      Calcium Carbonate-Vitamin D (CALCIUM-VITAMIN D3) 600-200 MG-UNIT TABS Take 1 tablet by mouth 2 (two) times daily.      cholecalciferol (VITAMIN D) 1000 UNITS tablet Take 1,000 Units by mouth 2 (two) times daily.      fexofenadine (ALLEGRA) 180 MG tablet Take 180 mg by mouth as needed. Allergies     fluticasone (FLONASE) 50 MCG/ACT nasal spray Place 2 sprays into the nose as needed. Allergies     letrozole (FEMARA) 2.5 MG tablet Take 2.5 mg by mouth every evening.      loratadine (CLARITIN) 10 MG tablet Take 10 mg by mouth daily as needed. For allergies     montelukast (SINGULAIR) 10 MG tablet Take 10 mg by mouth as needed. For allergies & congestion    Multiple Vitamin (MULTIVITAMIN) tablet Take 1 tablet by mouth daily.      niacin (NIASPAN) 1000 MG CR tablet Take 1,000 mg by mouth at bedtime.      ranitidine (ZANTAC) 300 MG capsule Take 300 mg by mouth daily as needed. Acid indigestion      senna-docusate (SENOKOT-S) 8.6-50 MG per tablet Take 1 tablet by mouth daily.      simvastatin (ZOCOR) 20 MG tablet Take 20 mg by mouth at bedtime.      valsartan (DIOVAN) 160 MG tablet Take 1 tablet (160 mg total) by mouth daily.        Disposition:  The patient will be discharged in stable condition to home. I have spoken to Gershon Crane PA-C with [facility] who plans to evaluate the patient for suture removal prior to discharge and will schedule a follow-up appointment with Dr. Donata Clay for her. Discharge Orders    Future Appointments: Provider: Department: Dept Phone: Center:   09/28/2011 11:30 AM Vella Kohler [facility] [place] [phone] [facility]   11/14/2011 4:00 PM Gardiner Rhyme, MD [facility] [phone] [facility]     Future Orders Please Complete By Expires   Diet - low sodium heart healthy      Increase activity slowly      Comments:   No driving for 2 weeks.. No lifting over 5 lbs for 2 weeks.    Discharge wound care:      Comments:   Keep wound site clean and dry. Call/return for pain, swelling, bleeding or pus!   Care order/instruction      Scheduling Instructions:   Remove CVL prior to discharge.     Follow-up Information    Follow up with [facility]. (Wound check at Dr. Jenel Lucks office on 09/28/11 at 11:30am)    Contact information:   [address] [place] [address]       Follow up with Hillis Range, MD. (Your January appointment has been moved up to 11/14/11 at 4pm)    Contact information:   [address], [address] [place] [address] [phone]       Follow up with VAN Dinah Beers, MD. (His office will arrange follow-up. If you have not heard back from them by Monday, please call their office.)    Contact information:   [address] [place] [address] [phone]          Duration of Discharge Encounter: Greater than 30 minutes including  physician and PA time.  Signed, Ronie Spies PA-C  09/23/2011, 9:21 AM  Pt seen, examined on rounds today, and above reviewed. [name] is doing well today and without complaint.  CTAB. RRR, ICD pocket is healing nicely.  Will discharge to home. Continue Amiodarone 200mg  BID x 4 weeks, then 200mg  daily Continue coreg 12.5mg  BID Decrease lasix at home to 60mg  daily.  She will have a wound check in our office next week.  I will see her in 4-6 weeks. She has been given a magnet and instructed on its use should she have afib with RVR resulting in ICD therapy.

## 2011-09-28 ENCOUNTER — Ambulatory Visit (INDEPENDENT_AMBULATORY_CARE_PROVIDER_SITE_OTHER): Payer: PRIVATE HEALTH INSURANCE | Admitting: *Deleted

## 2011-09-28 ENCOUNTER — Encounter: Payer: Self-pay | Admitting: Internal Medicine

## 2011-09-28 DIAGNOSIS — I5022 Chronic systolic (congestive) heart failure: Secondary | ICD-10-CM

## 2011-09-28 DIAGNOSIS — I429 Cardiomyopathy, unspecified: Secondary | ICD-10-CM

## 2011-09-28 LAB — ICD DEVICE OBSERVATION
AL AMPLITUDE: 0.5 mv
AL THRESHOLD: 0.75 V
BAMS-0003: 70 {beats}/min
DEVICE MODEL ICD: 7004282
HV IMPEDENCE: 62 Ohm
PACEART VT: 0
RV LEAD IMPEDENCE ICD: 475 Ohm
TOT-0006: 20120914000000
TOT-0009: 1
TOT-0010: 2
VF: 0

## 2011-09-28 NOTE — Progress Notes (Signed)
Wound check-ICD 

## 2011-09-30 MED FILL — Sodium Chloride Inj 0.9%: INTRAMUSCULAR | Qty: 20 | Status: AC

## 2011-09-30 MED FILL — Magnesium Sulfate Inj 50%: INTRAMUSCULAR | Qty: 4 | Status: AC

## 2011-10-03 ENCOUNTER — Other Ambulatory Visit: Payer: Self-pay | Admitting: Cardiothoracic Surgery

## 2011-10-03 DIAGNOSIS — I428 Other cardiomyopathies: Secondary | ICD-10-CM

## 2011-10-04 ENCOUNTER — Telehealth: Payer: Self-pay | Admitting: Internal Medicine

## 2011-10-04 NOTE — Telephone Encounter (Signed)
New message:  Pt called and has a question about the glue that was used during her surgery.  ICD replacement was the surgery.  The glue is really causing a problem.  Please call and discuss this with patient.

## 2011-10-04 NOTE — Telephone Encounter (Signed)
lmom that we do not use glue  Not sure what Dr Alla German used during his procedure but she can call be back if needed

## 2011-10-10 ENCOUNTER — Ambulatory Visit (INDEPENDENT_AMBULATORY_CARE_PROVIDER_SITE_OTHER): Payer: Self-pay | Admitting: Physician Assistant

## 2011-10-10 ENCOUNTER — Ambulatory Visit
Admission: RE | Admit: 2011-10-10 | Discharge: 2011-10-10 | Disposition: A | Discharge status: Home / Self Care | Payer: PRIVATE HEALTH INSURANCE | Source: Ambulatory Visit | Attending: Cardiothoracic Surgery | Admitting: Cardiothoracic Surgery

## 2011-10-10 VITALS — BP 112/76 | HR 67 | Resp 20 | Ht 62.0 in | Wt 234.0 lb

## 2011-10-10 DIAGNOSIS — Z9889 Other specified postprocedural states: Secondary | ICD-10-CM

## 2011-10-10 DIAGNOSIS — I509 Heart failure, unspecified: Secondary | ICD-10-CM

## 2011-10-10 DIAGNOSIS — I428 Other cardiomyopathies: Secondary | ICD-10-CM

## 2011-10-10 MED ORDER — TRAMADOL HCL 50 MG PO TABS: 50.0000 mg | ORAL_TABLET | ORAL | Status: AC | PRN

## 2011-10-10 NOTE — Progress Notes (Signed)
HPI:  Patient returns for routine postoperative follow-up having undergone a left anterior thoracotomy for LV lead placement, revision of automatic implantable cardioverter defibrillator biventricular pacemaker, and On Q placement on 09/16/2011 by Dr. Donata Clay. Since hospital discharge the patient reports some incisional pain under left breast. She denies shortness of breath or chest pain.   Current Outpatient Prescriptions  Medication Sig Dispense Refill  . albuterol (PROVENTIL HFA;VENTOLIN HFA) 108 (90 BASE) MCG/ACT inhaler Inhale 2 puffs into the lungs every 4 (four) hours as needed. [name]       . albuterol (PROVENTIL) (2.5 MG/3ML) 0.083% nebulizer solution Take 2.5 mg by nebulization as needed. [name]       . ALPRAZolam (XANAX) 1 MG tablet Take 1 mg by mouth as needed. Anxiety       . amiodarone (PACERONE) 200 MG tablet Take 1 tablet (200 mg total) by mouth as directed. Take 1 tablet twice daily for 4 weeks. After that, decrease to 1 tablet daily.  60 tablet  1  . aspirin 81 MG EC tablet Take 81 mg by mouth daily.        . Bepotastine Besilate (BEPREVE) 1.5 % SOLN Place 1 drop into both eyes as needed. Dry eyes       . bimatoprost (LUMIGAN) 0.03 % ophthalmic solution Place 1 drop into both eyes at bedtime.        . Calcium Carbonate-Vitamin D (CALCIUM-VITAMIN D3) 600-200 MG-UNIT TABS Take 1 tablet by mouth 2 (two) times daily.        . carvedilol (COREG) 12.5 MG tablet Take 1 tablet (12.5 mg total) by mouth 2 (two) times daily with a meal. New dose/prescription.  60 tablet  6  . cholecalciferol (VITAMIN D) 1000 UNITS tablet Take 1,000 Units by mouth 2 (two) times daily.        . fexofenadine (ALLEGRA) 180 MG tablet Take 180 mg by mouth as needed. Allergies       . fluticasone (FLONASE) 50 MCG/ACT nasal spray Place 2 sprays into the nose as needed. Allergies       . furosemide (LASIX) 40 MG tablet Take 1.5 tablets (60 mg total) by mouth daily.      [name]  letrozole (FEMARA) 2.5 MG tablet Take 2.5 mg by mouth every evening.        . loratadine (CLARITIN) 10 MG tablet Take 10 mg by mouth daily as needed. For allergies       . montelukast (SINGULAIR) 10 MG tablet Take 10 mg by mouth as needed. For allergies & congestion      . Multiple Vitamin (MULTIVITAMIN) tablet Take 1 tablet by mouth daily.        . niacin (NIASPAN) 1000 MG CR tablet Take 1,000 mg by mouth at bedtime.        [name] 20 MG capsule Take 20 mg by mouth 2 (two) times daily.        [name] PARoxetine (PAXIL) 10 MG tablet Take 10 mg by mouth every morning.        . potassium chloride SA (K-DUR,KLOR-CON) 20 MEQ tablet Take 2 tablets (40 mEq total) by mouth daily.      . ranitidine (ZANTAC) 300 MG capsule Take 300 mg by mouth daily as needed. Acid indigestion      . senna-docusate (SENOKOT-S) 8.6-50 MG per tablet Take 1 tablet by mouth daily.        . simvastatin (ZOCOR) 20 MG tablet Take 20 mg  by mouth at bedtime.        . valsartan (DIOVAN) 160 MG tablet Take 1 tablet (160 mg total) by mouth daily.  30 tablet  11  . traMADol (ULTRAM) 50 MG tablet Take 1-2 tablets (50-100 mg total) by mouth every 4 (four) hours as needed for pain. Maximum dose= 8 tablets per day  40 tablet  0    Physical Exam Cardiovascular: Regular rate and rhythm Pulmonary: Clear also rotation bilaterally; no rales, wheezes, or rhonchi . Wounds: Clean ,dry, well-healed, no signs of infection  Diagnostic Tests: PA and lateral chest x-ray done today shows resolution to proceed seen left pleural effusion, stable cardiomegaly, no pneumothorax.  Impression and Plan: Patient is surgically stable status post left anterior thoracotomy left ventricular lead placement. Regarding the patient's complaints of left incisional pain (underneath her left breast), it should continue to improve with time. Patient is almost out of Ultram and is requesting a prescription refill.Patient has been given Ultram 50-100 mg by  mouth every 4 hours when necessary pain #40 with no refills. Patient was instructed she may begin driving short distances i.e. 30 minutes or less during the day (provided she is not taking any narcotics).She may gradually increase her frequency and duration as tolerates. The patient also inquired about participating in physical therapy. She was encouraged to do so but that she should also check with Dr. Johney Frame office for clearance before doing so. She will contact our office if her left anterior incisional pain does not improve;otherwise, she will be seen by Dr. Donata Clay on a PRN basis. It did be noted that she does have a followup appointment to see Dr. Johney Frame in January.

## 2011-10-11 ENCOUNTER — Telehealth: Payer: Self-pay | Admitting: Internal Medicine

## 2011-10-11 NOTE — Telephone Encounter (Signed)
New msg Pt has been out of hospital about 2 weeks She thinks it is amiodarone. She has been nauseated since starting this medicine. Please call her back.

## 2011-10-11 NOTE — Telephone Encounter (Signed)
Usually after a meal  Today was after breakfast lasted from 10:30-1:30  Last night it was after supper lasting 2 hours  At least once daily sometimes twice daily

## 2011-10-12 NOTE — Telephone Encounter (Signed)
Discussed with Dr Johney Frame  Will decrease Amiodarone to 200mg  daily  I left this on her voicemail and have asked her to call me back if needed

## 2011-10-17 ENCOUNTER — Encounter: Payer: PRIVATE HEALTH INSURANCE | Admitting: Internal Medicine

## 2011-10-28 ENCOUNTER — Telehealth: Payer: Self-pay | Admitting: Internal Medicine

## 2011-10-28 NOTE — Telephone Encounter (Signed)
Or [phone], needs refill amiodarone 200mg ,  Once a day

## 2011-10-31 ENCOUNTER — Other Ambulatory Visit: Payer: Self-pay | Admitting: Internal Medicine

## 2011-10-31 MED ORDER — AMIODARONE HCL 200 MG PO TABS: 200.0000 mg | ORAL_TABLET | Freq: Every day | ORAL | Status: DC

## 2011-11-09 ENCOUNTER — Telehealth: Payer: Self-pay | Admitting: Internal Medicine

## 2011-11-09 NOTE — Telephone Encounter (Signed)
New problem Pt wants to talk about her next appt please call her back

## 2011-11-09 NOTE — Telephone Encounter (Signed)
Called patient and discussed with her her upcoming appointment  She is going to keep the appointment in 2/13 and if she has problems call me and I will move it up

## 2011-11-14 ENCOUNTER — Encounter: Payer: PRIVATE HEALTH INSURANCE | Admitting: Internal Medicine

## 2011-11-18 ENCOUNTER — Encounter: Payer: Self-pay | Admitting: Physician Assistant

## 2011-11-21 ENCOUNTER — Encounter: Payer: Self-pay | Admitting: Physician Assistant

## 2011-11-29 ENCOUNTER — Ambulatory Visit (INDEPENDENT_AMBULATORY_CARE_PROVIDER_SITE_OTHER): Payer: PRIVATE HEALTH INSURANCE | Admitting: Physician Assistant

## 2011-11-29 ENCOUNTER — Encounter: Payer: Self-pay | Admitting: Physician Assistant

## 2011-11-29 DIAGNOSIS — I5022 Chronic systolic (congestive) heart failure: Secondary | ICD-10-CM

## 2011-11-29 DIAGNOSIS — I10 Essential (primary) hypertension: Secondary | ICD-10-CM

## 2011-11-29 DIAGNOSIS — I4891 Unspecified atrial fibrillation: Secondary | ICD-10-CM

## 2011-11-29 DIAGNOSIS — F329 Major depressive disorder, single episode, unspecified: Secondary | ICD-10-CM

## 2011-11-29 NOTE — Assessment & Plan Note (Signed)
Maintaining NSR.  Remains on amiodarone.  Recent LFTs ok.  Will request TSH from PCP.  Follow up with Dr. Hillis Range as planned.

## 2011-11-29 NOTE — Assessment & Plan Note (Signed)
Refer to cardiac rehabilitation at [facility].  Continue current medical therapy.  Followup with Dr. Johney Frame as planned and Dr. Verne Carrow in 4 mos.

## 2011-11-29 NOTE — Progress Notes (Signed)
[address] [place], [place]  [address] Phone: [phone] Fax:  [phone]  Date:  11/29/2011   Name:  [name]       DOB:  [birth year] MRN:  [id number]  PCP:  Dr. Shary Decamp Primary Cardiologist:  Dr. Verne Carrow  Primary Electrophysiologist:  Dr. Hillis Range    History of Present Illness: [name] is a 61 y.o. female who presents for follow up.    She has a h/o NICM (EF 15-20%), LBBB, and chronic systolic CHF. She was diagnosed with R sided breast cancer 2009. She underwent lumpectomy, XRT, and chemotherapy at that time. In February 2010 she was admitted with abdominal pain. She was found to have LBBB and a Type B aortic dissection which was managed conservatively at that time. She had no other cardiac issues. Her aortic dissection has healed. She has followed with Dr. Morton Peters for her aortic dissection. She was admitted to [facility] on December 5th, 2011 with volume overload and pulmonary edema. Echo showed EF of 25%. Cardiac cath on 10/06/10 with no evidence of coronary artery disease. NICM was felt to be related to her chemotherapy. She had a DVT in her left leg December 2011 and was treated with coumadin for 6 months.  She underwent St. Jude ICD implant in 9/12 with Dr. Hillis Range. LV lead placement was unsuccessful. BiV-ICD box was implanted in hopes that LV lead placement could be re-attempted.  She was eventually brought in in 09/2011 for left thoracotomy and successful epicardial LV lead placement with Dr. Donata Clay.  That admission was complicated by AFib with RVR that resulted in inappropriate ICD discharges.  She required amiodarone due to hypotension and eventual emergent DCCV.  Coumadin was deferred at that time due to occurrence in post op setting.  2D Echo 09/19/11:  Mild LVH, EF 15%, mild LAE, PASP 37.     She would like to start cardiac rehab.  She is expecting to be able to do more now and is somewhat frustrated.   Saw her PCP last week.  Paxil was increased.  She has some hip pain.  Thought about going to PT.  Had TSH checked and had near syncope with blood draw.  She says TSH was ok.  No chest pain.  Denies orthopnea, PND, significant edema.  DOE is stable.  Describes NYHA class 2b symptoms.  BP was slightly low at home.  She had Diovan decreased to 1/2 QD and BPs at home are better.   Labs at [facility] clinic 11/18/11: Hgb 12.0, K 3.5, BUN 14, Creatinine 0.9, AST 34, ALT 23.   Past Medical History  Diagnosis Date  . Nonischemic cardiomyopathy     Admission 12/11 with EF 25%, normal coronary arteries by cath 12/11; NICM presumed to be secondary to chemotherapy for breast cancer  . DVT (deep venous thrombosis) 10/18/2010  . Obesity   . 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 Glaucoma   . Depression   . Breast cancer 07/2009    Stage 3 lumpectomy, radiation, chemotherapy; finished chemo October, felt to be in remission  . History of shingles   . Hyperlipidemia   . Chronic systolic dysfunction of left ventricle   . Left bundle branch block   . Cardiac defibrillator in place     St. Jude (JA) 9/12; LV lead placement unsuccessful;  s/p left thoracotomy with placement of epicardial LV lead 09/2011 (Dr. PVT)  . Shortness  of breath     with exertion  . Hypertension   . CHF (congestive heart failure)   . GERD (gastroesophageal reflux disease)   . Arthritis     lower back  . Hiatal hernia     small hiatal hernia  . Atrial fibrillation     post op (left thoracotomy) 11/12 req amio/DCCV    Current Outpatient Prescriptions  Medication Sig Dispense Refill  . albuterol (PROVENTIL HFA;VENTOLIN HFA) 108 (90 BASE) MCG/ACT inhaler Inhale 2 puffs into the lungs every 4 (four) hours as needed. [name]       . albuterol (PROVENTIL) (2.5 MG/3ML) 0.083% nebulizer solution Take 2.5 mg by nebulization as needed. [name]       . ALPRAZolam (XANAX) 1 MG tablet Take 1 mg by mouth as  needed. Anxiety       . amiodarone (PACERONE) 200 MG tablet Take 1 tablet (200 mg total) by mouth daily.  30 tablet  1  . aspirin 81 MG EC tablet Take 81 mg by mouth daily.        . Bepotastine Besilate (BEPREVE) 1.5 % SOLN Place 1 drop into both eyes as needed. Dry eyes       . bimatoprost (LUMIGAN) 0.03 % ophthalmic solution Place 1 drop into both eyes at bedtime.        . Calcium Carbonate-Vitamin D (CALCIUM-VITAMIN D3) 600-200 MG-UNIT TABS Take 1 tablet by mouth 2 (two) times daily.        . carvedilol (COREG) 12.5 MG tablet Take 1 tablet (12.5 mg total) by mouth 2 (two) times daily with a meal. New dose/prescription.  60 tablet  6  . cholecalciferol (VITAMIN D) 1000 UNITS tablet Take 1,000 Units by mouth 2 (two) times daily.        . fexofenadine (ALLEGRA) 180 MG tablet Take 180 mg by mouth as needed. Allergies       . fluticasone (FLONASE) 50 MCG/ACT nasal spray Place 2 sprays into the nose as needed. Allergies       . furosemide (LASIX) 40 MG tablet Take 1.5 tablets (60 mg total) by mouth daily.      [name] letrozole (FEMARA) 2.5 MG tablet Take 2.5 mg by mouth every evening.        . loratadine (CLARITIN) 10 MG tablet Take 10 mg by mouth daily as needed. For allergies       . montelukast (SINGULAIR) 10 MG tablet Take 10 mg by mouth as needed. For allergies & congestion      . Multiple Vitamin (MULTIVITAMIN) tablet Take 1 tablet by mouth daily.        . niacin (NIASPAN) 1000 MG CR tablet Take 1,000 mg by mouth at bedtime.        [name] 20 MG capsule Take 20 mg by mouth 2 (two) times daily.        [name] PARoxetine (PAXIL) 10 MG tablet Take 10 mg by mouth every morning.        . potassium chloride SA (K-DUR,KLOR-CON) 20 MEQ tablet Take 2 tablets (40 mEq total) by mouth daily.      . ranitidine (ZANTAC) 300 MG capsule Take 300 mg by mouth daily as needed. Acid indigestion      . senna-docusate (SENOKOT-S) 8.6-50 MG per tablet Take 1 tablet by mouth daily.        . simvastatin  (ZOCOR) 20 MG tablet Take 20 mg by mouth at bedtime.        [name]  traMADol (ULTRAM) 50 MG tablet Take 1-2 tablets (50-100 mg total) by mouth every 4 (four) hours as needed for pain. Maximum dose= 8 tablets per day  40 tablet  0  . valsartan (DIOVAN) 160 MG tablet Take 1 tablet (160 mg total) by mouth daily.  30 tablet  11    Allergies: Allergies  Allergen Reactions  . Tape     Blister from clear tapes.    History  Substance Use Topics  . Smoking status: Never Smoker   . Smokeless tobacco: Not on file  . Alcohol Use: No     ROS:  Please see the history of present illness.   All other systems reviewed and negative.   PHYSICAL EXAM: VS:  BP 140/68  Pulse 70  Resp 18  Ht 5\' 3"  (1.6 m)  Wt 231 lb 12.8 oz (105.144 kg)  BMI 41.06 kg/m2 Well nourished, well developed, in no acute distress HEENT: normal Neck: no JVD Cardiac:  normal S1, S2; RRR; no murmur Lungs:  clear to auscultation bilaterally, no wheezing, rhonchi or rales Abd: soft, nontender, no hepatomegaly Ext: trace bilateral edema Skin: warm and dry Neuro:  CNs 2-12 intact, no focal abnormalities noted  EKG:  AV paced, HR 70  ASSESSMENT AND PLAN:

## 2011-11-29 NOTE — Patient Instructions (Signed)
Your physician recommends that you schedule a follow-up appointment in: FOLLOW UP WITH DR. ALLRED AS ALREADY SCHEDULED  Your physician recommends that you schedule a follow-up appointment in: 4 MONTHS WITH DR. Clifton James  You have been referred to CARDIAC REHAB TO BE DONE @ [facility] DX 427.31 A-FIB, 428.22 HEART FAILURE, 401.1 HTN

## 2011-11-29 NOTE — Assessment & Plan Note (Signed)
Management by primary care. 

## 2011-11-29 NOTE — Assessment & Plan Note (Signed)
Blood pressures recently low.  I believe she had a vasovagal episode with a recent blood draw.  Her pressure looks better now.  Continue current therapy.

## 2011-12-01 ENCOUNTER — Encounter: Payer: PRIVATE HEALTH INSURANCE | Admitting: Internal Medicine

## 2011-12-12 ENCOUNTER — Encounter: Payer: Self-pay | Admitting: Internal Medicine

## 2011-12-12 ENCOUNTER — Ambulatory Visit (INDEPENDENT_AMBULATORY_CARE_PROVIDER_SITE_OTHER): Payer: PRIVATE HEALTH INSURANCE | Admitting: Internal Medicine

## 2011-12-12 DIAGNOSIS — I4891 Unspecified atrial fibrillation: Secondary | ICD-10-CM

## 2011-12-12 DIAGNOSIS — I429 Cardiomyopathy, unspecified: Secondary | ICD-10-CM

## 2011-12-12 DIAGNOSIS — I10 Essential (primary) hypertension: Secondary | ICD-10-CM

## 2011-12-12 LAB — ICD DEVICE OBSERVATION
AL AMPLITUDE: 0.4 mv
AL IMPEDENCE ICD: 400 Ohm
BAMS-0001: 180 {beats}/min
DEVICE MODEL ICD: 7004282
FVT: 0
HV IMPEDENCE: 74 Ohm
LV LEAD IMPEDENCE ICD: 325 Ohm
LV LEAD THRESHOLD: 2.125 V
RV LEAD AMPLITUDE: 12 mv
RV LEAD IMPEDENCE ICD: 475 Ohm
TOT-0007: 1
TOT-0008: 0
TOT-0009: 1
VF: 0

## 2011-12-12 LAB — BASIC METABOLIC PANEL
BUN: 14 mg/dL (ref 6–23)
Chloride: 104 mEq/L (ref 96–112)
GFR: 70.35 mL/min (ref >60.00)
Glucose, Bld: 83 mg/dL (ref 70–99)
Potassium: 3.5 mEq/L (ref 3.5–5.1)
Sodium: 140 mEq/L (ref 135–145)

## 2011-12-12 MED ORDER — FUROSEMIDE 40 MG PO TABS: ORAL_TABLET | ORAL | Status: DC

## 2011-12-12 MED ORDER — AMIODARONE HCL 200 MG PO TABS: 100.0000 mg | ORAL_TABLET | Freq: Every day | ORAL | Status: DC

## 2011-12-12 NOTE — Assessment & Plan Note (Signed)
Recently hypotensive Reduce lasix as above

## 2011-12-12 NOTE — Assessment & Plan Note (Signed)
Doing very well s/p CRT-D Normal ICD function See Pace Art report Rate sensor adjusted to promote increased heart rate with exertion.  She is >99% BiV paced.  2 gram sodium diet Check BMET today Decrease lasix to 40mg  daily, if weight goes up, she will increase back to 60mg  daily  She has a component of venous insufficiency also.  I have therefore recommended support hose at this time.  She will return for reassessment of volume status by Tereso Newcomer in 6 weeks.  Hopefully, she will tolerate lower doses of lasix given her symptomatic hypotension.

## 2011-12-12 NOTE — Assessment & Plan Note (Signed)
She had post op afib following thoracotomy resulting in inappropriate ICD shocks. No afib since amiodarone. Now that she is close to 30 months post op, I hope that her afib will not recur.  I would like to get her off of amiodarone long term. Decrease amiodarone to 100mg  daily today

## 2011-12-12 NOTE — Assessment & Plan Note (Signed)
S/p CRT-D as above  Repeat echo in 3 months to assess response.

## 2011-12-12 NOTE — Progress Notes (Signed)
PCP:  Feliciana Rossetti, MD, MD  The patient presents today for routine electrophysiology followup.  Since last being seen in our clinic, the patient reports doing very well. She reports occasional symptomatic dizziness with low BP.  Her primary care physician has decreased her diovan with some improvement.  Her energy and SOB continue to improve. Today, she denies symptoms of palpitations, chest pain, dizziness, presyncope, syncope, or neurologic sequela.  She has stable intermittent edema.  The patient feels that she is tolerating medications without difficulties and is otherwise without complaint today.   Past Medical History  Diagnosis Date  . Nonischemic cardiomyopathy     Admission 12/11 with EF 25%, normal coronary arteries by cath 12/11; NICM presumed to be secondary to chemotherapy for breast cancer  . DVT (deep venous thrombosis) 10/18/2010  . Obesity   . 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 Glaucoma   . Depression   . Breast cancer 07/2009    Stage 3 lumpectomy, radiation, chemotherapy; finished chemo October, felt to be in remission  . History of shingles   . Hyperlipidemia   . Chronic systolic dysfunction of left ventricle   . Left bundle branch block   . Cardiac defibrillator in place     St. Jude (JA) 9/12; LV lead placement unsuccessful;  s/p left thoracotomy with placement of epicardial LV lead 09/2011 (Dr. PVT)  . [name]     with exertion  . Hypertension   . CHF (congestive heart failure)   . GERD (gastroesophageal reflux disease)   . Arthritis     lower back  . Hiatal hernia     small hiatal hernia  . Atrial fibrillation     post op (left thoracotomy) 11/12 req amio/DCCV   Past Surgical History  Procedure Date  . Tubal ligation     Bilateral  . Oophorectomy     Single  . Breast lumpectomy     Right breast  . Tonsillectomy   . Cardiac defibrillator placement   . Cardiac catheterization     2011  . Thoracotomy 09/16/2011    Procedure:  THORACOTOMY MAJOR;  Surgeon: Kathlee Nations Suann Larry, MD;  Location: [facility] OR;  Service: Thoracic;  Laterality: Left;  left anterolateral Thoracotomy for placement of St. Jude epicardial pacing lead     Current Outpatient Prescriptions  Medication Sig Dispense Refill  . albuterol (PROVENTIL HFA;VENTOLIN HFA) 108 (90 BASE) MCG/ACT inhaler Inhale 2 puffs into the lungs every 4 (four) hours as needed. [name]       . albuterol (PROVENTIL) (2.5 MG/3ML) 0.083% nebulizer solution Take 2.5 mg by nebulization as needed. [name]       . ALPRAZolam (XANAX) 1 MG tablet Take 1 mg by mouth as needed. Anxiety       . amiodarone (PACERONE) 200 MG tablet Take 1 tablet (200 mg total) by mouth daily.  30 tablet  1  . aspirin 81 MG EC tablet Take 81 mg by mouth daily.        . Bepotastine Besilate (BEPREVE) 1.5 % SOLN Place 1 drop into both eyes as needed. Dry eyes       . bimatoprost (LUMIGAN) 0.03 % ophthalmic solution Place 1 drop into both eyes at bedtime.        . Calcium Carbonate-Vitamin D (CALCIUM-VITAMIN D3) 600-200 MG-UNIT TABS Take 1 tablet by mouth 2 (two) times daily.        . carvedilol (COREG) 12.5 MG tablet Take  1 tablet (12.5 mg total) by mouth 2 (two) times daily with a meal. New dose/prescription.  60 tablet  6  . cholecalciferol (VITAMIN D) 1000 UNITS tablet Take 1,000 Units by mouth 2 (two) times daily.        . fexofenadine (ALLEGRA) 180 MG tablet Take 180 mg by mouth as needed. Allergies       . fluticasone (FLONASE) 50 MCG/ACT nasal spray Place 2 sprays into the nose as needed. Allergies       . furosemide (LASIX) 40 MG tablet Take 1.5 tablets (60 mg total) by mouth daily.      [name] letrozole (FEMARA) 2.5 MG tablet Take 2.5 mg by mouth every evening.        . loratadine (CLARITIN) 10 MG tablet Take 10 mg by mouth daily as needed. For allergies       . montelukast (SINGULAIR) 10 MG tablet Take 10 mg by mouth as needed. For allergies & congestion      . Multiple Vitamin  (MULTIVITAMIN) tablet Take 1 tablet by mouth daily.        . niacin (NIASPAN) 1000 MG CR tablet Take 1,000 mg by mouth at bedtime.        [name] 20 MG capsule Take 20 mg by mouth 2 (two) times daily.        [name] oxyCODONE-acetaminophen (PERCOCET) 5-325 MG per tablet Take 1 tablet by mouth every 4 (four) hours as needed.      [name] PARoxetine (PAXIL) 10 MG tablet Take 20 mg by mouth every morning.       [name] (SYSTANE OP) Apply to eye as needed.      . potassium chloride SA (K-DUR,KLOR-CON) 20 MEQ tablet Take 2 tablets (40 mEq total) by mouth daily.      . ranitidine (ZANTAC) 300 MG capsule Take 300 mg by mouth daily as needed. Acid indigestion      . senna-docusate (SENOKOT-S) 8.6-50 MG per tablet Take 1 tablet by mouth daily.        . simvastatin (ZOCOR) 20 MG tablet Take 20 mg by mouth at bedtime.        . traMADol (ULTRAM) 50 MG tablet Take 1-2 tablets (50-100 mg total) by mouth every 4 (four) hours as needed for pain. Maximum dose= 8 tablets per day  40 tablet  0  . valsartan (DIOVAN) 160 MG tablet Take 160 mg by mouth daily. Take a 1/2 tablet if blood pressure bottom number is over 100.        Allergies  Allergen Reactions  . Tape     Blister from clear tapes.    History   Social History  . Marital Status: Married    Spouse Name: N/A    Number of Children: N/A  . Years of Education: N/A   Occupational History  . [employer]  . Retired from Photographer    Social History Main Topics  . Smoking status: Never Smoker   . Smokeless tobacco: Not on file  . Alcohol Use: No  . Drug Use: No  . Sexually Active: Not on file   Other Topics Concern  . 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    Family History  Problem Relation Age of Onset  . Hypertension Mother   . Hypertension  Father   . Hypertension Brother   . Hypertension Maternal Grandfather   . Heart attack Maternal Grandfather     MI  . Coronary artery disease Maternal Grandfather   . Hypertension Paternal Grandfather   . Heart attack Paternal Grandfather     MI  . Coronary artery disease Paternal Grandfather   . Hypertension Brother     Physical Exam: Filed Vitals:   12/12/11 1021  BP: 104/68  Pulse: 73  Height: 5\' 2"  (1.575 m)  Weight: 231 lb (104.781 kg)  SpO2: 96%    GEN- The patient is well appearing, alert and oriented x 3 today.   Head- normocephalic, atraumatic Eyes-  Sclera clear, conjunctiva pink Ears- hearing intact Oropharynx- clear Neck- supple, no JVP Lymph- no cervical lymphadenopathy Lungs- Clear to ausculation bilaterally, normal work of breathing Chest- ICD pocket is well healed Heart- Regular rate and rhythm, no murmurs, rubs or gallops, PMI not laterally displaced GI- soft, NT, ND, + BS Extremities- no clubbing, cyanosis, or edema  ICD interrogation- reviewed in detail today,  See PACEART report  Assessment and Plan:

## 2011-12-12 NOTE — Patient Instructions (Addendum)
Your physician recommends that you schedule a follow-up appointment in: 6 weeks with Lilian Coma  Your physician wants you to follow-up in: 6 months with DrAllred You will receive a reminder letter in the mail two months in advance. If you don't receive a letter, please call our office to schedule the follow-up appointment.   Remote monitoring is used to monitor your Pacemaker of ICD from home. This monitoring reduces the number of office visits required to check your device to one time per year. It allows [place] to keep an eye on the functioning of your device to ensure it is working properly. You are scheduled for a device check from home on 03/15/2012. You may send your transmission at any time that day. If you have a wireless device, the transmission will be sent automatically. After your physician reviews your transmission, you will receive a postcard with your next transmission date.    Your physician has requested that you have an echocardiogram. Echocardiography is a painless test that uses sound waves to create images of your heart. It provides your doctor with information about the size and shape of your heart and how well your heart's chambers and valves are working. This procedure takes approximately one hour. There are no restrictions for this procedure.  Your physician recommends that you return for lab work today  [facility]  Your physician has recommended you make the following change in your medication:  1) Decrease Furosemide to 40mg  daily---if weight goes up can take an extra 20mg  for the day  2) Decrease Amiodarone to 100mg  daily 1/2 of a 200mg  tablet  2 Gram Low Sodium Diet A 2 gram sodium diet restricts the amount of sodium in the diet to no more than 2 g or 2000 mg daily. Limiting the amount of sodium is often used to help lower blood pressure. It is important if you have heart, liver, or kidney problems. Many foods contain sodium for flavor and sometimes as a preservative. When the  amount of sodium in a diet needs to be low, it is important to know what to look for when choosing foods and drinks. The following includes some information and guidelines to help make it easier for you to adapt to a low sodium diet. QUICK TIPS  Do not add salt to food.   Avoid convenience items and fast food.   Choose unsalted snack foods.   Buy lower sodium products, often labeled as "lower sodium" or "no salt added."   Check food labels to learn how much sodium is in 1 serving.   When eating at a restaurant, ask that your food be prepared with less salt or none, if possible.  READING FOOD LABELS FOR SODIUM INFORMATION The nutrition facts label is a good place to find how much sodium is in foods. Look for products with no more than 500 to 600 mg of sodium per meal and no more than 150 mg per serving. Remember that 2 g = 2000 mg. The food label may also list foods as:  Sodium-free: Less than 5 mg in a serving.   Very low sodium: 35 mg or less in a serving.   Low-sodium: 140 mg or less in a serving.   Light in sodium: 50% less sodium in a serving. For example, if a food that usually has 300 mg of sodium is changed to become light in sodium, it will have 150 mg of sodium.   Reduced sodium: 25% less sodium in a serving. For example,  if a food that usually has 400 mg of sodium is changed to reduced sodium, it will have 300 mg of sodium.  CHOOSING FOODS Grains  Avoid: Salted crackers and snack items. Some cereals, including instant hot cereals. Bread stuffing and biscuit mixes. Seasoned rice or pasta mixes.   Choose: Unsalted snack items. Low-sodium cereals, oats, puffed wheat and rice, shredded wheat. English muffins and bread. Pasta.  Meats  Avoid: Salted, canned, smoked, spiced, pickled meats, including fish and poultry. Bacon, ham, sausage, cold cuts, hot dogs, anchovies.   Choose: Low-sodium canned tuna and salmon. Fresh or frozen meat, poultry, and fish.  Dairy  Avoid:  Processed cheese and spreads. Cottage cheese. Buttermilk and condensed milk. Regular cheese.   Choose: Milk. Low-sodium cottage cheese. Yogurt. Sour cream. Low-sodium cheese.  Fruits and Vegetables  Avoid: Regular canned vegetables. Regular canned tomato sauce and paste. Frozen vegetables in sauces. Olives. Rosita Fire. Relishes. Sauerkraut.   Choose: Low-sodium canned vegetables. Low-sodium tomato sauce and paste. Frozen or fresh vegetables. Fresh and frozen fruit.  Condiments  Avoid: Canned and packaged gravies. Worcestershire sauce. Tartar sauce. Barbecue sauce. Soy sauce. Steak sauce. Ketchup. Onion, garlic, and table salt. Meat flavorings and tenderizers.   Choose: Fresh and dried herbs and spices. Low-sodium varieties of mustard and ketchup. Lemon juice. Tabasco sauce. Horseradish.  SAMPLE 2 GRAM SODIUM MEAL PLAN Breakfast / Sodium (mg)  1 cup low-fat milk / 143 mg   2 slices whole-wheat toast / 270 mg   1 tbs heart-healthy margarine / 153 mg   1 hard-boiled egg / 139 mg   1 small orange / 0 mg  Lunch / Sodium (mg)  1 cup raw carrots / 76 mg    cup hummus / 298 mg   1 cup low-fat milk / 143 mg    cup red grapes / 2 mg   1 whole-wheat pita bread / 356 mg  Dinner / Sodium (mg)  1 cup whole-wheat pasta / 2 mg   1 cup low-sodium tomato sauce / 73 mg   3 oz lean ground beef / 57 mg   1 small side salad (1 cup raw spinach leaves,  cup cucumber,  cup yellow bell pepper) with 1 tsp olive oil and 1 tsp red wine vinegar / 25 mg  Snack / Sodium (mg)  1 container low-fat vanilla yogurt / 107 mg   3 graham cracker squares / 127 mg  Nutrient Analysis  Calories: 2033   Protein: 77 g   Carbohydrate: 282 g   Fat: 72 g   Sodium: 1971 mg  Document Released: 10/17/2005 Document Revised: 06/29/2011 Document Reviewed: 01/18/2010 [facility] Patient Information 2012 [place], [facility].  Patient needs to purchase Support Hose

## 2011-12-22 ENCOUNTER — Other Ambulatory Visit (HOSPITAL_COMMUNITY): Payer: PRIVATE HEALTH INSURANCE

## 2012-01-24 ENCOUNTER — Encounter: Payer: Self-pay | Admitting: Physician Assistant

## 2012-01-24 ENCOUNTER — Ambulatory Visit (INDEPENDENT_AMBULATORY_CARE_PROVIDER_SITE_OTHER): Payer: PRIVATE HEALTH INSURANCE | Admitting: Physician Assistant

## 2012-01-24 VITALS — BP 106/68 | HR 70 | Ht 62.0 in | Wt 226.0 lb

## 2012-01-24 DIAGNOSIS — I4891 Unspecified atrial fibrillation: Secondary | ICD-10-CM

## 2012-01-24 DIAGNOSIS — I5022 Chronic systolic (congestive) heart failure: Secondary | ICD-10-CM

## 2012-01-24 DIAGNOSIS — I429 Cardiomyopathy, unspecified: Secondary | ICD-10-CM

## 2012-01-24 MED ORDER — CARVEDILOL 6.25 MG PO TABS: ORAL_TABLET | ORAL | Status: DC

## 2012-01-24 NOTE — Progress Notes (Signed)
[address] [place], [place]  [address] Phone: [phone] Fax:  [phone]  Date:  01/24/2012   Name:  [name]       DOB:  1950/11/09 MRN:  [id number]  PCP:  Dr. Shary Decamp  Primary Cardiologist:  Dr. Verne Carrow  Primary Electrophysiologist:  Dr. Hillis Range    History of Present Illness: [name] is a 61 y.o. female who presents for follow up.  She has a h/o NICM (EF 15-20%), LBBB, and chronic systolic CHF. She was diagnosed with R sided breast cancer 2009. She underwent lumpectomy, XRT, and chemotherapy at that time. In February 2010 she was admitted with abdominal pain. She was found to have LBBB and a Type B aortic dissection which was managed conservatively at that time. She had no other cardiac issues. Her aortic dissection has healed. She has followed with Dr. Morton Peters for her aortic dissection. She was admitted to [facility] on 09/2010 with volume overload and pulmonary edema. Echo showed EF of 25%. Cardiac cath on 10/06/10 with no evidence of coronary artery disease. NICM was felt to be related to her chemotherapy. She had a DVT in her left leg December 2011 and was treated with coumadin for 6 months. She underwent St. Jude ICD implant in 9/12 with Dr. Hillis Range. LV lead placement was unsuccessful. BiV-ICD box was implanted in hopes that LV lead placement could be re-attempted. She was eventually brought in in 09/2011 for left thoracotomy and successful epicardial LV lead placement with Dr. Donata Clay. That admission was complicated by AFib with RVR that resulted in inappropriate ICD discharges. She required amiodarone due to hypotension and eventual emergent DCCV. Coumadin was deferred at that time due to occurrence in post op setting. 2D Echo 09/19/11: Mild LVH, EF 15%, mild LAE, PASP 37.   She was seen in followup by Dr. Johney Frame 12/12/11.  He adjusted her Lasix at that time b/c of symptomatic hypotension.  She was brought  back to see me today to reassess to make sure that she is not developing volume overload on a lower dose of Lasix.  He started titrating her amiodarone to off by decreasing the dose to 100 mg a day.  Since last seen, she is stable.  She had one more episode of lightheadedness.  She was at a baby shower at her church.  She had some juice and felt better.  BP at home 90/60.  She is doing well at cardiac rehab.  Really describes Class 2b symptoms.  Feels tired.  No chest pain.  No syncope.  No orthopnea, PND or increased edema.  Weights stable.     Past Medical History  Diagnosis Date  . Nonischemic cardiomyopathy     Admission 12/11 with EF 25%, normal coronary arteries by cath 12/11; NICM presumed to be secondary to chemotherapy for breast cancer  . DVT (deep venous thrombosis) 10/18/2010  . Obesity   . 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 Glaucoma   . Depression   . Breast cancer 07/2009    Stage 3 lumpectomy, radiation, chemotherapy; finished chemo October, felt to be in remission  . History of shingles   . Hyperlipidemia   . Chronic systolic dysfunction of left ventricle   . Left bundle branch block   . Cardiac defibrillator in place     St. Jude (JA) 9/12; LV lead placement unsuccessful;  s/p left thoracotomy with placement of epicardial LV lead 09/2011 (  Dr. Norina Buzzard)  . [name]     with exertion  . Hypertension   . CHF (congestive heart failure)   . GERD (gastroesophageal reflux disease)   . Arthritis     lower back  . Hiatal hernia     small hiatal hernia  . Atrial fibrillation     post op (left thoracotomy) 11/12 req amio/DCCV    Current Outpatient Prescriptions  Medication Sig Dispense Refill  . albuterol (PROVENTIL HFA;VENTOLIN HFA) 108 (90 BASE) MCG/ACT inhaler Inhale 2 puffs into the lungs every 4 (four) hours as needed. [name]       . albuterol (PROVENTIL) (2.5 MG/3ML) 0.083% nebulizer solution Take 2.5 mg by nebulization as needed. Shortness of  breath       . ALPRAZolam (XANAX) 1 MG tablet Take 1 mg by mouth as needed. Anxiety       . amiodarone (PACERONE) 200 MG tablet Take 0.5 tablets (100 mg total) by mouth daily.  30 tablet  1  . aspirin 81 MG EC tablet Take 81 mg by mouth daily.        . Bepotastine Besilate (BEPREVE) 1.5 % SOLN Place 1 drop into both eyes as needed. Dry eyes       . bimatoprost (LUMIGAN) 0.03 % ophthalmic solution Place 1 drop into both eyes at bedtime.        . Calcium Carbonate-Vitamin D (CALCIUM-VITAMIN D3) 600-200 MG-UNIT TABS Take 1 tablet by mouth 2 (two) times daily.        . carvedilol (COREG) 12.5 MG tablet Take 1 tablet (12.5 mg total) by mouth 2 (two) times daily with a meal. New dose/prescription.  60 tablet  6  . cholecalciferol (VITAMIN D) 1000 UNITS tablet Take 1,000 Units by mouth 2 (two) times daily.        . fexofenadine (ALLEGRA) 180 MG tablet Take 180 mg by mouth as needed. Allergies       . fluticasone (FLONASE) 50 MCG/ACT nasal spray Place 2 sprays into the nose as needed. Allergies       . furosemide (LASIX) 40 MG tablet Take one tablet daily and as directed  May take an additional 20mg  a day if weight goes up  30 tablet    . letrozole (FEMARA) 2.5 MG tablet Take 2.5 mg by mouth every evening.        . loratadine (CLARITIN) 10 MG tablet Take 10 mg by mouth daily as needed. For allergies       . montelukast (SINGULAIR) 10 MG tablet Take 10 mg by mouth as needed. For allergies & congestion      . Multiple Vitamin (MULTIVITAMIN) tablet Take 1 tablet by mouth daily.        . niacin (NIASPAN) 1000 MG CR tablet Take 1,000 mg by mouth at bedtime.        [name] 20 MG capsule Take 20 mg by mouth 2 (two) times daily.        [name] oxyCODONE-acetaminophen (PERCOCET) 5-325 MG per tablet Take 1 tablet by mouth every 4 (four) hours as needed.      [name] PARoxetine (PAXIL) 10 MG tablet Take 20 mg by mouth every morning.       [name] (SYSTANE OP) Apply to eye as  needed.      . potassium chloride SA (K-DUR,KLOR-CON) 20 MEQ tablet Take 2 tablets (40 mEq total) by mouth daily.      [name] senna-docusate (SENOKOT-S) 8.6-50 MG  per tablet Take 1 tablet by mouth daily.        . simvastatin (ZOCOR) 20 MG tablet Take 20 mg by mouth at bedtime.        . traMADol (ULTRAM) 50 MG tablet Take 1-2 tablets (50-100 mg total) by mouth every 4 (four) hours as needed for pain. Maximum dose= 8 tablets per day  40 tablet  0  . valsartan (DIOVAN) 160 MG tablet Take 160 mg by mouth daily. Take a 1/2 tablet if blood pressure bottom number is over 100.        Allergies: Allergies  Allergen Reactions  . Tape     Blister from clear tapes.    History  Substance Use Topics  . Smoking status: Never Smoker   . Smokeless tobacco: Never Used  . Alcohol Use: No     ROS:  Please see the history of present illness.   Notes some ridges in her fingernails since admission in 09/2011.  Of note, TSH checked with PCP in last 1-2 mos.  All other systems reviewed and negative.   PHYSICAL EXAM: VS:  BP 106/68  Pulse 70  Ht 5\' 2"  (1.575 m)  Wt 226 lb (102.513 kg)  BMI 41.34 kg/m2 Well nourished, well developed, in no acute distress HEENT: normal Neck: no JVD Cardiac:  normal S1, S2; RRR; no murmur Lungs:  clear to auscultation bilaterally, no wheezing, rhonchi or rales Abd: soft, nontender, no hepatomegaly Ext: no pitting edema Skin: warm and dry Neuro:  CNs 2-12 intact, no focal abnormalities noted  EKG:  NSR, V paced, HR 70  Labs:  Lab Results  Component Value Date   CREATININE 0.9 12/12/2011   BUN 14 12/12/2011   NA 140 12/12/2011   K 3.5 12/12/2011   CL 104 12/12/2011   CO2 30 12/12/2011     ASSESSMENT AND PLAN:  1. Chronic systolic heart failure  Volume stable.  I think she is tolerating the lower dose of Lasix well.  She continues to have some episodes of symptomatic hypotension.  I will decrease her Coreg to 9.375 mg bid to see if this helps.  We could go back to 6.25  mg bid if needed.  Otherwise, continue current Rx.  Follow up with Dr. Verne Carrow as planned in 02/2012.   2. A-fib  Maintaining NSR.  She is on amiodarone 100 mg daily now.  Follow up with Dr. Hillis Range as planned.     3. Secondary cardiomyopathy, unspecified  S/p CRT-D.  Follow up as noted.      Signed, Tereso Newcomer, PA-C  10:35 AM 01/24/2012

## 2012-01-24 NOTE — Patient Instructions (Signed)
Your physician recommends that you schedule a follow-up appointment in: 03/27/12 @ 10:15 TO SEE DR. [facility]  Your physician has recommended you make the following change in your medication: DECREASE COREG TO 6.25 MG TABLET AND TAKE 1 AND 1/2 (HALF) TABLETS TWICE DAILY

## 2012-01-30 ENCOUNTER — Other Ambulatory Visit: Payer: Self-pay | Admitting: *Deleted

## 2012-01-30 DIAGNOSIS — I429 Cardiomyopathy, unspecified: Secondary | ICD-10-CM

## 2012-01-30 MED ORDER — AMIODARONE HCL 200 MG PO TABS: 100.0000 mg | ORAL_TABLET | Freq: Every day | ORAL | Status: DC

## 2012-03-01 ENCOUNTER — Telehealth: Payer: Self-pay | Admitting: Cardiovascular Disease

## 2012-03-01 NOTE — Telephone Encounter (Signed)
Patient  has been taken Amiodarone 200 mg daily since November 2012. She said that she has been sweating since she started taken this medication, and the dose that she is taken now is 1/2 tablet (100 mg) once a day. For the last 2 weeks patient has experience tingling on right and left index finger and left middle finger, her legs shaken, And she is sweating all over her body. Patient think that is the Amiodarone medication that is causing these side effests. Patient said that when she had the medication refill in the instructions reads that one of the side effects is sweating.

## 2012-03-01 NOTE — Telephone Encounter (Signed)
Dr. Clifton James recommends for patient  to hold Amiodarone  for now and have this message rout to Dr. Johney Frame  For recommendations. Patient aware , she verbalized understanding.

## 2012-03-01 NOTE — Telephone Encounter (Signed)
New msg Pt called and said she is having numbess in hand and shaking, sweating. She thinks it is side effects of one of her meds. No chest pain or sob.

## 2012-03-15 ENCOUNTER — Ambulatory Visit (HOSPITAL_COMMUNITY): Payer: PRIVATE HEALTH INSURANCE | Attending: Cardiology

## 2012-03-15 ENCOUNTER — Ambulatory Visit (INDEPENDENT_AMBULATORY_CARE_PROVIDER_SITE_OTHER): Payer: PRIVATE HEALTH INSURANCE | Admitting: *Deleted

## 2012-03-15 DIAGNOSIS — R0602 Shortness of breath: Secondary | ICD-10-CM

## 2012-03-15 DIAGNOSIS — I428 Other cardiomyopathies: Secondary | ICD-10-CM

## 2012-03-15 DIAGNOSIS — I5022 Chronic systolic (congestive) heart failure: Secondary | ICD-10-CM

## 2012-03-15 DIAGNOSIS — I429 Cardiomyopathy, unspecified: Secondary | ICD-10-CM

## 2012-03-15 DIAGNOSIS — I059 Rheumatic mitral valve disease, unspecified: Secondary | ICD-10-CM | POA: Insufficient documentation

## 2012-03-16 ENCOUNTER — Encounter: Payer: Self-pay | Admitting: Internal Medicine

## 2012-03-16 LAB — REMOTE ICD DEVICE
AL AMPLITUDE: 0.9 mv
ATRIAL PACING ICD: 89 pct
DEV-0020ICD: NEGATIVE
HV IMPEDENCE: 66 Ohm
RV LEAD AMPLITUDE: 12 mv
RV LEAD IMPEDENCE ICD: 480 Ohm

## 2012-03-21 ENCOUNTER — Telehealth: Payer: Self-pay | Admitting: *Deleted

## 2012-03-21 ENCOUNTER — Other Ambulatory Visit: Payer: Self-pay | Admitting: Cardiovascular Disease

## 2012-03-21 DIAGNOSIS — I5022 Chronic systolic (congestive) heart failure: Secondary | ICD-10-CM

## 2012-03-21 DIAGNOSIS — I4891 Unspecified atrial fibrillation: Secondary | ICD-10-CM

## 2012-03-21 NOTE — Telephone Encounter (Signed)
Message copied by Deliah Boston on Wed Mar 21, 2012 10:30 AM ------      Message from: Hillis Range      Created: Sun Mar 18, 2012  8:49 PM       Results reviewed.  Please inform pt of result.      Given her very poor LV function, she should follow closely with Dr Clifton James.      In addition, please schedule for echo optimization of her BiV device.

## 2012-03-21 NOTE — Telephone Encounter (Signed)
Pt needs an AV optimization echo  Will schedule on same day as apt with [facility]   Called patient and let her know she is going to need this to help function of her heart

## 2012-03-27 ENCOUNTER — Ambulatory Visit (HOSPITAL_COMMUNITY): Payer: PRIVATE HEALTH INSURANCE | Attending: Internal Medicine

## 2012-03-27 ENCOUNTER — Ambulatory Visit (INDEPENDENT_AMBULATORY_CARE_PROVIDER_SITE_OTHER): Payer: PRIVATE HEALTH INSURANCE | Admitting: *Deleted

## 2012-03-27 ENCOUNTER — Encounter: Payer: Self-pay | Admitting: Internal Medicine

## 2012-03-27 ENCOUNTER — Ambulatory Visit (INDEPENDENT_AMBULATORY_CARE_PROVIDER_SITE_OTHER): Payer: PRIVATE HEALTH INSURANCE | Admitting: Cardiovascular Disease

## 2012-03-27 ENCOUNTER — Encounter: Payer: Self-pay | Admitting: Cardiovascular Disease

## 2012-03-27 VITALS — BP 118/62 | HR 70 | Ht 62.5 in | Wt 226.0 lb

## 2012-03-27 DIAGNOSIS — I4891 Unspecified atrial fibrillation: Secondary | ICD-10-CM | POA: Insufficient documentation

## 2012-03-27 DIAGNOSIS — I5022 Chronic systolic (congestive) heart failure: Secondary | ICD-10-CM

## 2012-03-27 DIAGNOSIS — I428 Other cardiomyopathies: Secondary | ICD-10-CM

## 2012-03-27 DIAGNOSIS — Z95810 Presence of automatic (implantable) cardiac defibrillator: Secondary | ICD-10-CM | POA: Insufficient documentation

## 2012-03-27 DIAGNOSIS — I82409 Acute embolism and thrombosis of unspecified deep veins of unspecified lower extremity: Secondary | ICD-10-CM | POA: Insufficient documentation

## 2012-03-27 DIAGNOSIS — R0609 Other forms of dyspnea: Secondary | ICD-10-CM | POA: Insufficient documentation

## 2012-03-27 DIAGNOSIS — I447 Left bundle-branch block, unspecified: Secondary | ICD-10-CM | POA: Insufficient documentation

## 2012-03-27 DIAGNOSIS — R0989 Other specified symptoms and signs involving the circulatory and respiratory systems: Secondary | ICD-10-CM | POA: Insufficient documentation

## 2012-03-27 DIAGNOSIS — C50919 Malignant neoplasm of unspecified site of unspecified female breast: Secondary | ICD-10-CM | POA: Insufficient documentation

## 2012-03-27 NOTE — Progress Notes (Signed)
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 I saw her as a consult at [facility] in February 2010 when she was admitted with abdominal pain. She was found to have LBBB and a Type B aortic dissection which was managed conservatively at the time. She had no other cardiac issues. Her aortic dissection has healed. She has followed with Dr. Morton Peters for her aortic dissection. She was admitted to [facility] on December 5th, 2011 with volume overload and pulmonary edema. Echo showed EF of 25%. Cardiac cath on 10/06/10 with no evidence of coronary artery disease. It was felt that this may be related to her chemotherapy. She had a DVT in her left leg December 2011. ICD was placed September 2012 by Dr. Johney Frame. LV lead placement was unsuccessful. BiV-ICD box was implanted in hopes that LV lead placement could be re-attempted. She was eventually brought in in 09/2011 for left thoracotomy and successful epicardial LV lead placement with Dr. Donata Clay. That admission was complicated by AFib with RVR that resulted in inappropriate ICD discharges. She required amiodarone due to hypotension and eventual emergent DCCV. Coumadin was deferred at that time due to occurrence in post op setting. 2D Echo 09/19/11: Mild LVH, EF 15%, mild LAE, PASP 37. She was seen in followup by Dr. Johney Frame 12/12/11 and most recently by Tereso Newcomer PA-C in March 2013. Her lasix has been lowered over the last few months because of symptomatic hypotension.   She has been feeling well. No chest pain or SOB. Her weight has been stable. Occasional dizziness.   Primary Care Physician: Shary Decamp  Past Medical History  Diagnosis Date  . Nonischemic cardiomyopathy     Admission 12/11 with EF 25%, normal coronary arteries by cath 12/11; NICM presumed to be secondary to chemotherapy for breast cancer  . DVT (deep  venous thrombosis) 10/18/2010  . Obesity   . 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 Glaucoma   . Depression   . Breast cancer 07/2009    Stage 3 lumpectomy, radiation, chemotherapy; finished chemo October, felt to be in remission  . History of shingles   . Hyperlipidemia   . Chronic systolic dysfunction of left ventricle   . Left bundle branch block   . Cardiac defibrillator in place     St. Jude (JA) 9/12; LV lead placement unsuccessful;  s/p left thoracotomy with placement of epicardial LV lead 09/2011 (Dr. PVT)  . [name]     with exertion  . Hypertension   . CHF (congestive heart failure)   . GERD (gastroesophageal reflux disease)   . Arthritis     lower back  . Hiatal hernia     small hiatal hernia  . Atrial fibrillation     post op (left thoracotomy) 11/12 req amio/DCCV    Past Surgical History  Procedure Date  . Tubal ligation     Bilateral  . Oophorectomy     Single  . Breast lumpectomy     Right breast  . Tonsillectomy   . Cardiac defibrillator placement   . Cardiac catheterization     2011  . Thoracotomy 09/16/2011    Procedure: THORACOTOMY MAJOR;  Surgeon: Kathlee Nations Suann Larry, MD;  Location: [facility] OR;  Service: Thoracic;  Laterality: Left;  left anterolateral Thoracotomy for placement of St. Jude epicardial pacing lead  Current Outpatient Prescriptions  Medication Sig Dispense Refill  . albuterol (PROVENTIL HFA;VENTOLIN HFA) 108 (90 BASE) MCG/ACT inhaler Inhale 2 puffs into the lungs every 4 (four) hours as needed. [name]       . albuterol (PROVENTIL) (2.5 MG/3ML) 0.083% nebulizer solution Take 2.5 mg by nebulization as needed. [name]       . ALPRAZolam (XANAX) 1 MG tablet Take 1 mg by mouth as needed. Anxiety       . aspirin 81 MG EC tablet Take 81 mg by mouth daily.        . bimatoprost (LUMIGAN) 0.03 % ophthalmic solution Place 1 drop into both eyes at bedtime.        . Calcium Carbonate-Vitamin D (CALCIUM-VITAMIN  D3) 600-200 MG-UNIT TABS Take 1 tablet by mouth 2 (two) times daily.        . carvedilol (COREG) 6.25 MG tablet TAKE 1 AND 1/2 (HALF) TABLETS TWICE DAILY  90 tablet  11  . cholecalciferol (VITAMIN D) 1000 UNITS tablet Take 1,000 Units by mouth 2 (two) times daily.        [name] DIOVAN 160 MG tablet TAKE 1 TABLET ONCE DAILY  30 each  6  . fexofenadine (ALLEGRA) 180 MG tablet Take 180 mg by mouth as needed. Allergies       . fluticasone (FLONASE) 50 MCG/ACT nasal spray Place 2 sprays into the nose as needed. Allergies       . furosemide (LASIX) 40 MG tablet Take one tablet daily and as directed  May take an additional 20mg  a day if weight goes up  30 tablet    . letrozole (FEMARA) 2.5 MG tablet Take 2.5 mg by mouth every evening.        . loratadine (CLARITIN) 10 MG tablet Take 10 mg by mouth daily as needed. For allergies       . montelukast (SINGULAIR) 10 MG tablet Take 10 mg by mouth as needed. For allergies & congestion      . Multiple Vitamin (MULTIVITAMIN) tablet Take 1 tablet by mouth daily.        . niacin (NIASPAN) 1000 MG CR tablet Take 1,000 mg by mouth at bedtime.        [name] 20 MG capsule Take 20 mg by mouth 2 (two) times daily.        [name] oxyCODONE-acetaminophen (PERCOCET) 5-325 MG per tablet Take 1 tablet by mouth every 4 (four) hours as needed.      [name] PARoxetine (PAXIL) 10 MG tablet Take 20 mg by mouth every morning.       [name] (SYSTANE OP) Apply to eye as needed.      . potassium chloride SA (K-DUR,KLOR-CON) 20 MEQ tablet Take 2 tablets (40 mEq total) by mouth daily.      [name] senna-docusate (SENOKOT-S) 8.6-50 MG per tablet Take 1 tablet by mouth daily.        . simvastatin (ZOCOR) 20 MG tablet Take 20 mg by mouth at bedtime.        . traMADol (ULTRAM) 50 MG tablet Take 1-2 tablets (50-100 mg total) by mouth every 4 (four) hours as needed for pain. Maximum dose= 8 tablets per day  40 tablet  0  . Bepotastine Besilate (BEPREVE) 1.5 % SOLN  Place 1 drop into both eyes as needed. Dry eyes       . DISCONTD: ranitidine (ZANTAC) 300 MG capsule Take 300 mg by mouth daily as  needed. Acid indigestion        Allergies  Allergen Reactions  . Tape     Blister from clear tapes.    History   Social History  . Marital Status: Married    Spouse Name: N/A    Number of Children: N/A  . Years of Education: N/A   Occupational History  . [employer]  . Retired from Photographer    Social History Main Topics  . Smoking status: Never Smoker   . Smokeless tobacco: Never Used  . Alcohol Use: No  . Drug Use: No  . Sexually Active: Not on file   Other Topics Concern  . Not on file   Social History Narrative   MarriedOne childLives in [place] with spouse and mother in lawPreviously worked as a Public librarian for community one bankShe is a Teacher, English as a foreign language.    Family History  Problem Relation Age of Onset  . Hypertension Mother   . Hypertension Father   . Hypertension Brother   . Hypertension Maternal Grandfather   . Heart attack Maternal Grandfather     MI  . Coronary artery disease Maternal Grandfather   . Hypertension Paternal Grandfather   . Heart attack Paternal Grandfather     MI  . Coronary artery disease Paternal Grandfather   . Hypertension Brother     Review of Systems:  As stated in the HPI and otherwise negative.   BP 118/62  Pulse 70  Ht 5' 2.5" (1.588 m)  Wt 226 lb (102.513 kg)  BMI 40.68 kg/m2  Physical Examination: General: Well developed, well nourished, NAD HEENT: OP clear, mucus membranes moist SKIN: warm, dry. No rashes. Neuro: No focal deficits Musculoskeletal: Muscle strength 5/5 all ext Psychiatric: Mood and affect normal Neck: No JVD, no carotid bruits, no thyromegaly, no lymphadenopathy. Lungs:Clear bilaterally, no wheezes, rhonci, crackles Cardiovascular: Regular rate and rhythm. No murmurs, gallops or rubs. Abdomen:Soft. Bowel sounds present. Non-tender.    Extremities: No lower extremity edema. Pulses are 2 + in the bilateral DP/PT.  EKG: Paced. Rate 70 bpm.

## 2012-03-27 NOTE — Patient Instructions (Signed)
Your physician wants you to follow-up in:  6 months. You will receive a reminder letter in the mail two months in advance. If you don't receive a letter, please call our office to schedule the follow-up appointment.   

## 2012-03-27 NOTE — Assessment & Plan Note (Signed)
She is doing well. Optimizaton of Bi-V ICD today. Doing well with current dose of Lasix and Coreg. No changes today. She is following weights at home. No changes today.

## 2012-03-27 NOTE — Assessment & Plan Note (Signed)
Sinus today. No changes. Off of amiodarone.

## 2012-03-27 NOTE — Assessment & Plan Note (Signed)
Volume status is ok. Will continue Lasix. She will follow weights ta home.

## 2012-03-28 ENCOUNTER — Encounter: Payer: Self-pay | Admitting: *Deleted

## 2012-05-24 ENCOUNTER — Other Ambulatory Visit: Payer: Self-pay | Admitting: Physician Assistant

## 2012-05-30 ENCOUNTER — Telehealth: Payer: Self-pay | Admitting: Cardiovascular Disease

## 2012-05-30 NOTE — Telephone Encounter (Signed)
Spoke with pt who states she woke up this AM with shortness of breath and wheezing. Took extra 20 mg lasix. Since then has urinated several times and shortness of breath is gone.  Has not had any shortness of breath until this AM. No swelling. No weight gain. Oxygen sat 96%-98%. Blood pressure 103/87 and heart rate 69.  I instructed pt to let us know if shortness of breath returns or if she has wt gain or swelling. She will send Merlin transmission today.

## 2012-05-30 NOTE — Telephone Encounter (Signed)
Corvue report received and reviewed by device clinic. Readings normal. Pt notified.

## 2012-05-30 NOTE — Telephone Encounter (Signed)
New msg Pt wants to talk to you about fluid buildup. Please call

## 2012-06-03 NOTE — Telephone Encounter (Signed)
Thanks

## 2012-06-15 ENCOUNTER — Encounter: Payer: Self-pay | Admitting: Internal Medicine

## 2012-06-21 ENCOUNTER — Encounter: Payer: Self-pay | Admitting: Internal Medicine

## 2012-06-21 ENCOUNTER — Encounter: Payer: Self-pay | Admitting: *Deleted

## 2012-06-21 ENCOUNTER — Ambulatory Visit (INDEPENDENT_AMBULATORY_CARE_PROVIDER_SITE_OTHER): Payer: PRIVATE HEALTH INSURANCE | Admitting: *Deleted

## 2012-06-21 DIAGNOSIS — Z95810 Presence of automatic (implantable) cardiac defibrillator: Secondary | ICD-10-CM

## 2012-06-21 DIAGNOSIS — I5022 Chronic systolic (congestive) heart failure: Secondary | ICD-10-CM

## 2012-06-21 LAB — REMOTE ICD DEVICE
AL AMPLITUDE: 0.5 mv
ATRIAL PACING ICD: 89 pct
BAMS-0001: 180 {beats}/min
BAMS-0003: 70 {beats}/min
DEV-0020ICD: NEGATIVE
RV LEAD AMPLITUDE: 11.9 mv
VENTRICULAR PACING ICD: 98 pct

## 2012-06-22 ENCOUNTER — Telehealth: Payer: Self-pay | Admitting: *Deleted

## 2012-06-22 DIAGNOSIS — I5022 Chronic systolic (congestive) heart failure: Secondary | ICD-10-CM

## 2012-06-22 MED ORDER — FUROSEMIDE 40 MG PO TABS: ORAL_TABLET | ORAL | Status: DC

## 2012-06-22 NOTE — Telephone Encounter (Signed)
Spoke with pt. She will continue Lasix 40 mg every AM and 20 mg every PM. She does not need new prescription at this time. She will come in on Monday for BMP. She will monitor weight at home and call us if it increases or if she has shortness of breath.

## 2012-06-22 NOTE — Telephone Encounter (Signed)
Spoke with pt. She reports she has started taking extra 20 mg Lasix most evenings since end of July.  She reports at present time her breathing is OK and swelling is better.  She states on June 16, 2012 she was in the car for a couple of hours and noticed she was retaining fluid late in the day. She had drank 32 ounces of fluid that day with little urine out put.   She took extra 40 mg Lasix that evening.  She urinated 1000 ml that evening.  Pt states she usually drinks 40 ounces of fluid daily. On August 18th she returned to taking Lasix 40 every AM and 20 every PM. Weight is staying 219-220 lbs. She states one day this week she did not take extra 20 mg Lasix in the evening and her wt was up to 222 lbs the next morning. I told pt I would review with Dr. Clifton James and call her back

## 2012-06-22 NOTE — Telephone Encounter (Signed)
Agree with continuing extra Lasix and check BMET in one week. cdm

## 2012-06-22 NOTE — Telephone Encounter (Signed)
[name], Can we see how [name] is doing? We may need to bump her Lasix up for a few days. Thanks, chris   ----- Message ----- From: [name] Sent: 06/21/2012 12:10 PM To: Kathleene Hazel, MD Dr. Clifton James, I just saw a remote on [name] [phone] and her CorVue is abnormal over the past week. She takes furosemide 40mg  daily and has a reminder in to see you again in November. Thanks, Gunnar Fusi  Above copied from message from Dr. Clifton James.

## 2012-06-25 ENCOUNTER — Telehealth: Payer: Self-pay | Admitting: *Deleted

## 2012-06-25 ENCOUNTER — Other Ambulatory Visit (INDEPENDENT_AMBULATORY_CARE_PROVIDER_SITE_OTHER): Payer: PRIVATE HEALTH INSURANCE

## 2012-06-25 DIAGNOSIS — I5022 Chronic systolic (congestive) heart failure: Secondary | ICD-10-CM

## 2012-06-25 LAB — BASIC METABOLIC PANEL
BUN: 15 mg/dL (ref 6–23)
Calcium: 9.1 mg/dL (ref 8.4–10.5)
Chloride: 104 mEq/L (ref 96–112)
Creatinine, Ser: 0.9 mg/dL (ref 0.4–1.2)
GFR: 66.67 mL/min (ref >60.00)

## 2012-06-25 NOTE — Telephone Encounter (Signed)
Pt walked in today for scheduled lab work.  1.  She experienced at church yesterday " everything went black in front of my face but I did not pass out" She is having no complaints today.  Device interrogated by [employer].  No episode found. Pt reassured. 2.  She would also like a copy of today's labs mailed to her.  She has also brought in a copy of her recent labs at pcp office. I have made a copy and will leave them with Dennie Bible RN box. She will keep follow-up appt with Dr. Clifton James on 09/25/12.  I will forward to Dr. Clifton James and Dennie Bible. Mylo Red RN

## 2012-06-25 NOTE — Telephone Encounter (Signed)
Thanks, chris 

## 2012-06-26 ENCOUNTER — Telehealth: Payer: Self-pay | Admitting: *Deleted

## 2012-06-26 NOTE — Telephone Encounter (Signed)
Spoke with pt and reviewed lab results. Will send copy to her per her request.  I told pt we had received labs from primary care in office. Pt with several questions regarding recent device readings.  I told her I would have device clinic call her to review

## 2012-06-26 NOTE — Telephone Encounter (Signed)
Spoke with patient, no ventricular episodes on interrogation 06/25/12.

## 2012-07-03 ENCOUNTER — Encounter: Payer: Self-pay | Admitting: *Deleted

## 2012-07-09 NOTE — Progress Notes (Signed)
AV opt echo with industry

## 2012-07-19 ENCOUNTER — Telehealth: Payer: Self-pay | Admitting: Cardiovascular Disease

## 2012-07-19 NOTE — Telephone Encounter (Signed)
PT AWARE WILL CALL DR GRISSO'SOFFICE  FOR  ANSWER .[name]

## 2012-07-19 NOTE — Telephone Encounter (Signed)
Can we have her call primary care to ask this question? I do not see where it will be contraindicated from a cardiac standpoint. chris

## 2012-07-19 NOTE — Telephone Encounter (Signed)
PER PT HAS HAD SHINGLES APPROX 3 YEARS AGO  IN HER EAR WANTING TO GET VACCINE  AWARE DR Clifton James NOT IN OFFICE TODAY WILL FORWARD MESSAGE FOR HIS REVIEW  PT AWARE WILL CALL  ONCE  ANSWER RECEIVED .[name]

## 2012-07-19 NOTE — Telephone Encounter (Signed)
New Problem:    Patient called in wanting to know if she would be able to have a shingle shot this year.  Please call back.

## 2012-07-27 ENCOUNTER — Telehealth: Payer: Self-pay | Admitting: *Deleted

## 2012-07-27 ENCOUNTER — Other Ambulatory Visit: Payer: Self-pay | Admitting: Physician Assistant

## 2012-07-27 NOTE — Telephone Encounter (Signed)
lmptcb to verify what dose of K+ she is actually taking

## 2012-07-30 DIAGNOSIS — Z0271 Encounter for disability determination: Secondary | ICD-10-CM

## 2012-08-10 ENCOUNTER — Telehealth: Payer: Self-pay | Admitting: *Deleted

## 2012-08-10 DIAGNOSIS — I5022 Chronic systolic (congestive) heart failure: Secondary | ICD-10-CM

## 2012-08-10 MED ORDER — VALSARTAN 40 MG PO TABS: 40.0000 mg | ORAL_TABLET | Freq: Every day | ORAL | Status: DC

## 2012-08-10 NOTE — Telephone Encounter (Signed)
Received fax from pt's primary provider regarding possibly decreasing Diovan due to blood pressure. Blood pressure in office was in the 110 range and pt reports at home she is typically in the 90's without dizziness.   Pt was diagnosed with bronchitis when in for this office visit.   Pt is taking Diovan 40 mg daily.  This was decreased by primary provider in July..  Dr. Clifton James reviewed and would like pt to continue Diovan 40 mg daily.  I spoke with pt and gave her this information.  She will keep record of blood pressures and bring to appt with Dr. Clifton James next month.

## 2012-09-01 IMAGING — CR DG CHEST 2V
2 series · 2 of 2 positions shown · non-contrast
Comparison: 09/18/2011; 09/17/2011; 09/16/2011

CLINICAL DATA: Post heart surgery, now with shortness of breath and
chest soreness

CHEST - 2 VIEW

[w chest pa]
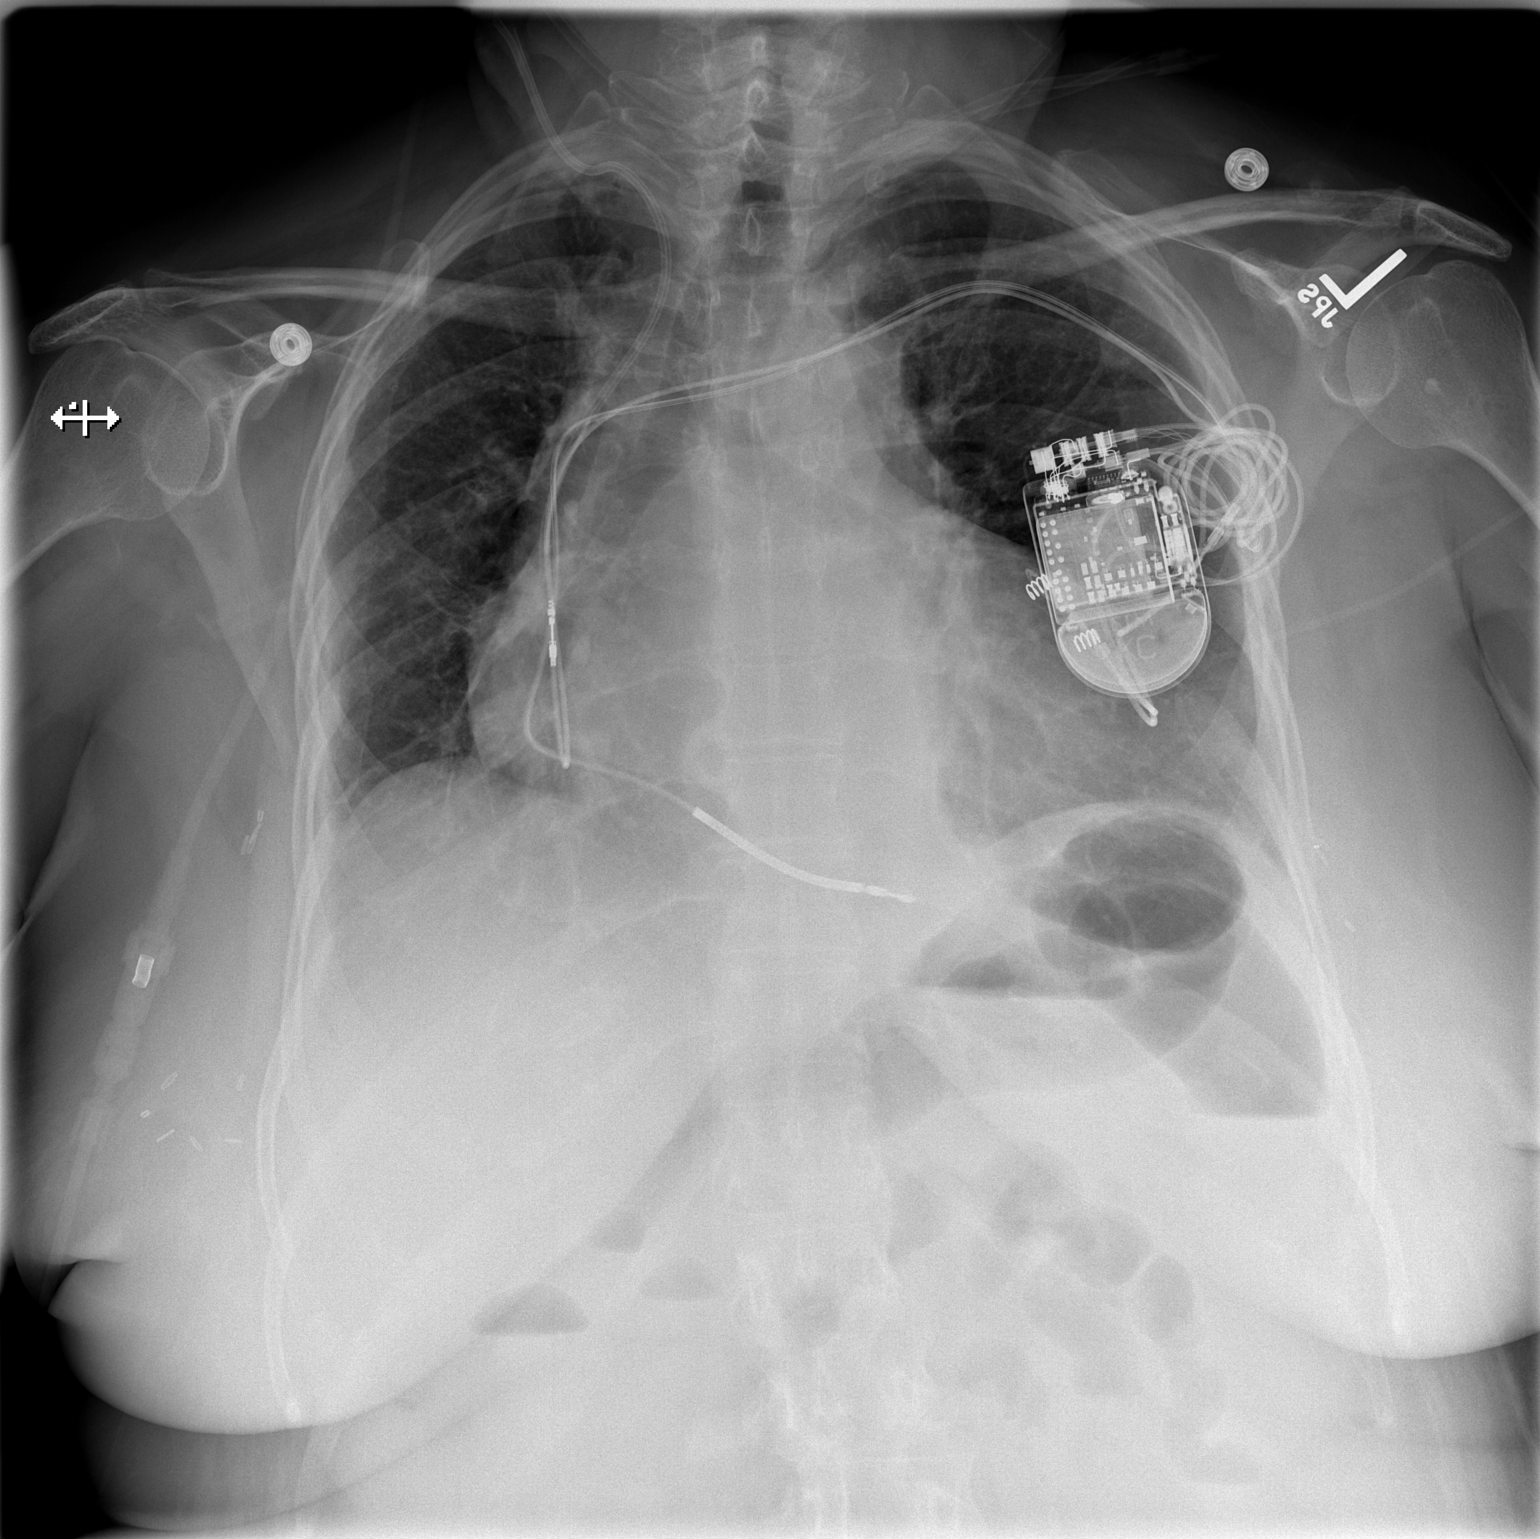

[w chest lat]
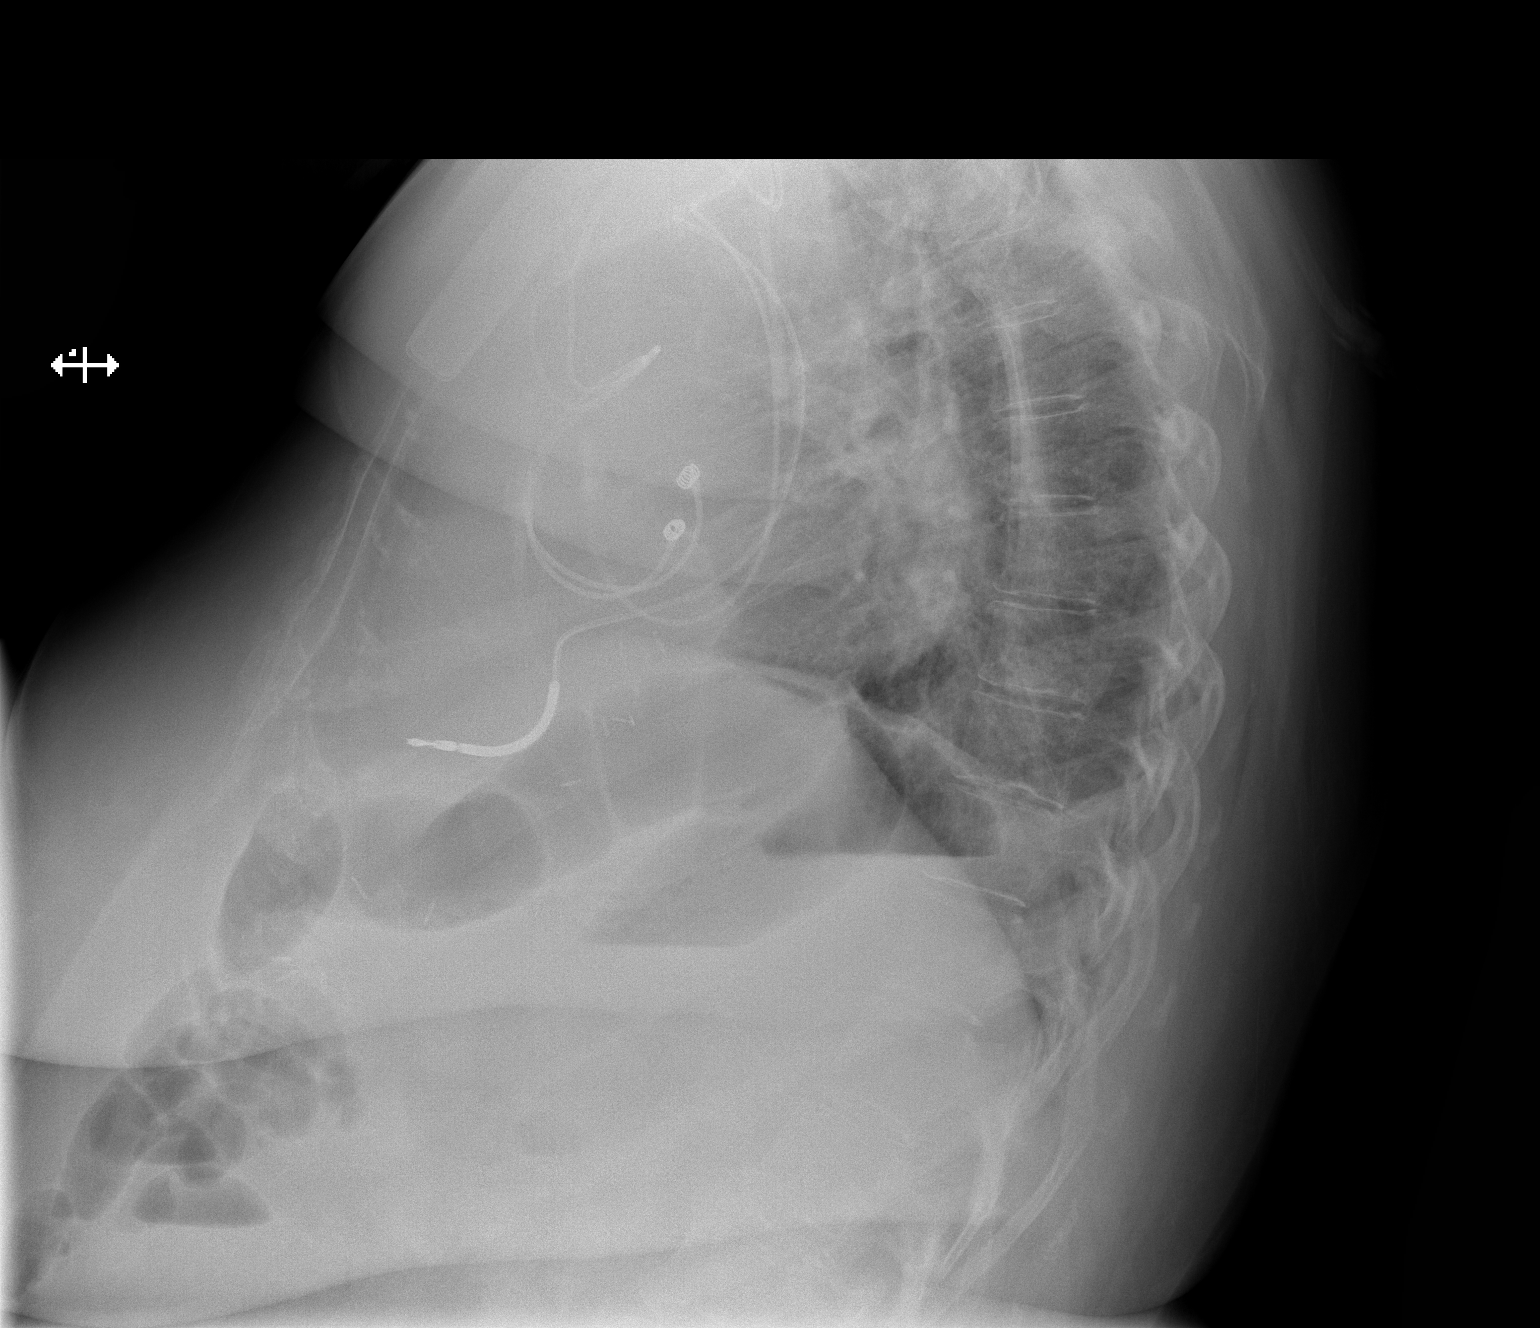

[2 of 2 positions shown; findings below may reference images not displayed]

FINDINGS: Unchanged enlarged cardiac silhouette and mediastinal contours.
Stable position of support apparatus.  No pneumothorax.  Grossly
unchanged minimal bibasilar opacities, left greater than right.
There is persistent blunting of the right costophrenic angle
suggestive of a small pleural effusion.  Grossly unchanged bones.
Clips overlie the bilateral breasts.
IMPRESSION: Unchanged minimal basilar atelectasis and likely trace right-sided
effusion.

## 2012-09-02 IMAGING — CR DG CHEST 1V PORT
1 series · 1 of 1 positions shown · non-contrast
Comparison: 1 day prior

CLINICAL DATA: [name]

PORTABLE CHEST - 1 VIEW

[AP]
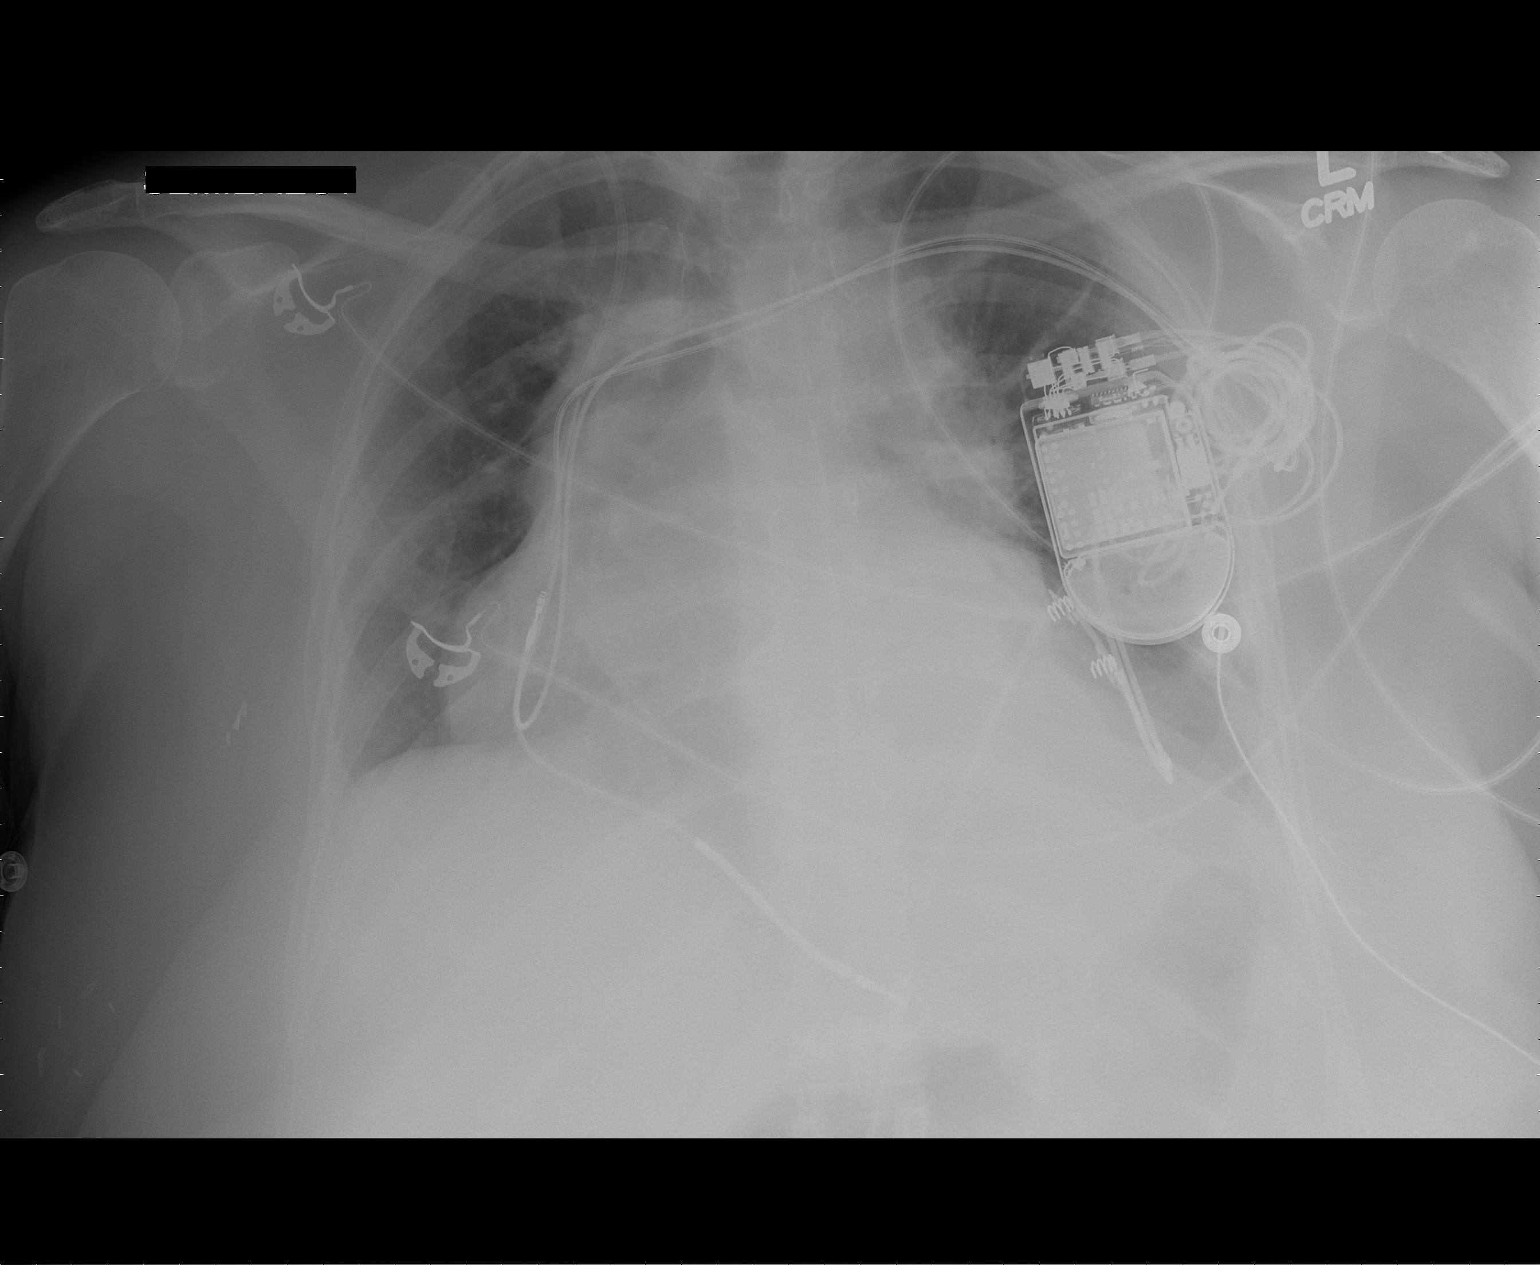

[1 of 1 positions shown; findings below may reference images not displayed]

FINDINGS: Dual lead pacer with leads right atrium and right
ventricle.  Right IJ central line is poorly visualized centrally
but likely terminates at the low SVC.

Patient rotated right.  Moderate cardiomegaly.  The right-sided
pleural effusion is not appreciated today. No pneumothorax.  Low
lung volumes. No congestive failure.

Right lung is clear.  Developing retrocardiac left lower lobe
airspace disease obscuring the left hemidiaphragm.
IMPRESSION: 1.  Cardiomegaly with low lung volumes.
2.  Developing retrocardiac airspace disease.  Favor atelectasis.
Recommend attention on follow-up.

## 2012-09-03 IMAGING — CR DG CHEST 2V
2 series · 2 of 2 positions shown · non-contrast
Comparison: 09/20/2011

CLINICAL DATA: [name]

CHEST - 2 VIEW

[w chest pa]
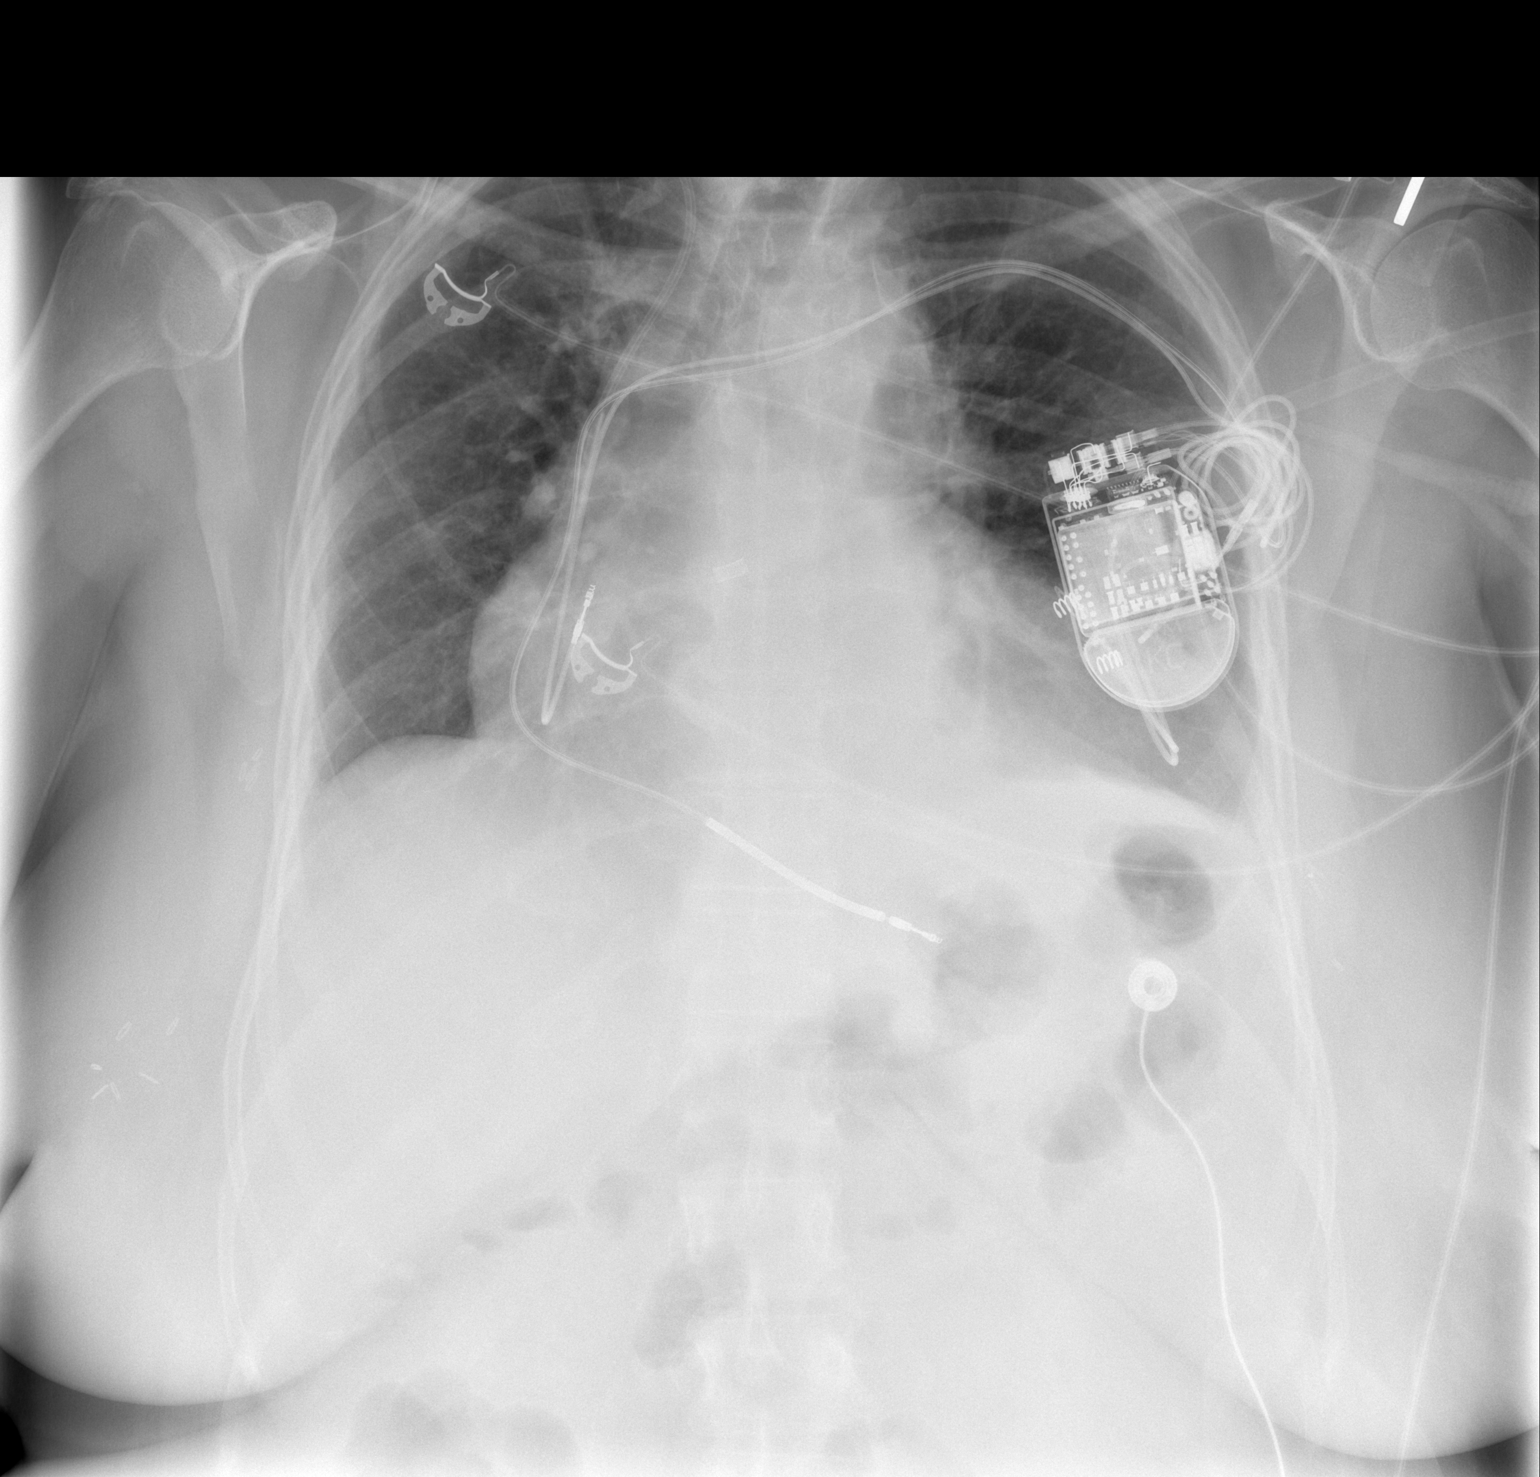

[w chest lat]
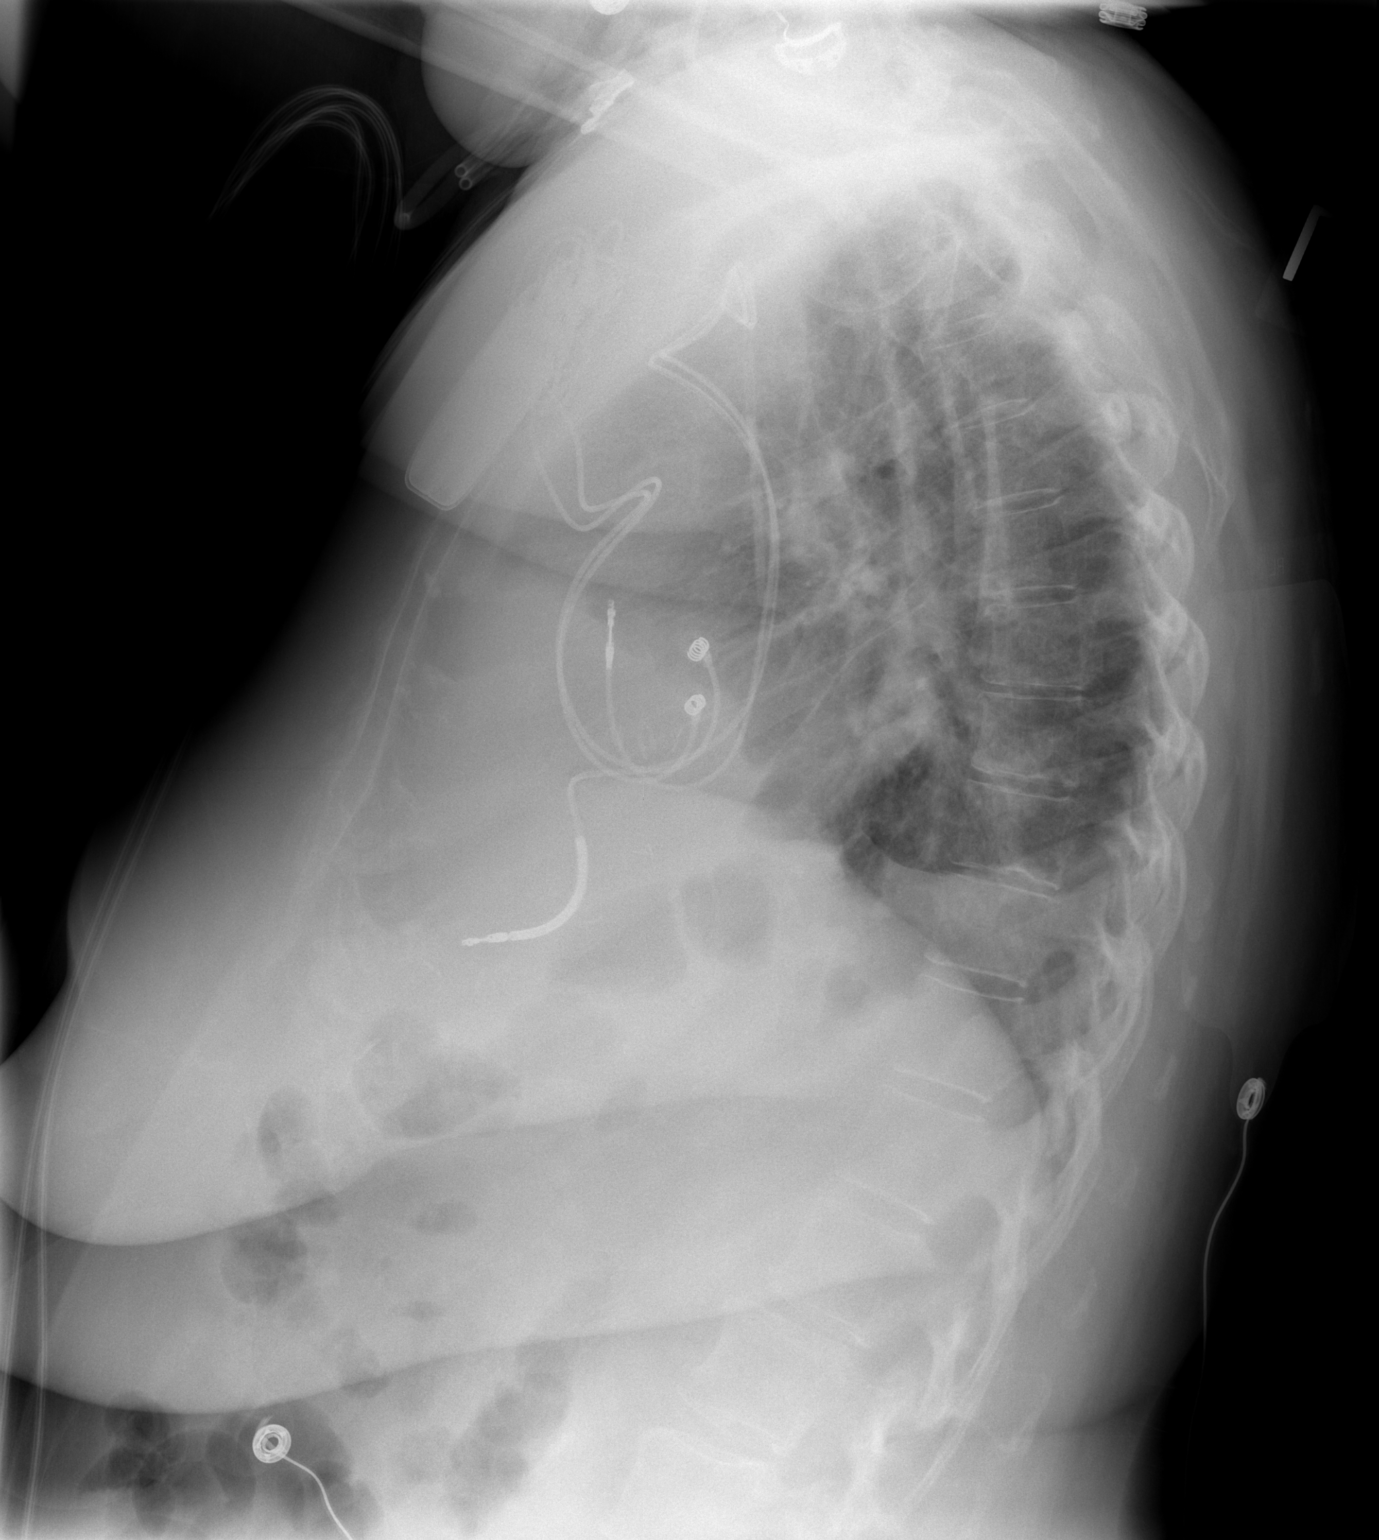

[2 of 2 positions shown; findings below may reference images not displayed]

FINDINGS: There is a right IJ catheter with tip in the SVC.

There is a left chest wall AICD with leads in the right atrial
appendage and right ventricle.

The heart size is moderately enlarged.

There are no pleural effusions or pulmonary edema.

No airspace consolidation is identified.

Mild multilevel spondylosis is noted within the thoracic spine.
IMPRESSION: 1.  Cardiac enlargement without evidence for overt heart failure.

## 2012-09-19 ENCOUNTER — Ambulatory Visit (INDEPENDENT_AMBULATORY_CARE_PROVIDER_SITE_OTHER): Payer: PRIVATE HEALTH INSURANCE | Admitting: Cardiovascular Disease

## 2012-09-19 ENCOUNTER — Encounter: Payer: Self-pay | Admitting: Cardiovascular Disease

## 2012-09-19 VITALS — BP 110/68 | HR 78 | Ht 62.5 in | Wt 228.0 lb

## 2012-09-19 DIAGNOSIS — I5022 Chronic systolic (congestive) heart failure: Secondary | ICD-10-CM

## 2012-09-19 DIAGNOSIS — I509 Heart failure, unspecified: Secondary | ICD-10-CM

## 2012-09-19 DIAGNOSIS — I428 Other cardiomyopathies: Secondary | ICD-10-CM

## 2012-09-19 NOTE — Patient Instructions (Addendum)
Your physician wants you to follow-up in: 12 months. You will receive a reminder letter in the mail two months in advance. If you don't receive a letter, please call our office to schedule the follow-up appointment.  You have been referred to  The Heart Failure clinic at [facility]

## 2012-09-19 NOTE — Progress Notes (Signed)
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 I saw her as a consult at [facility] in February 2010 when she was admitted with abdominal pain. She was found to have LBBB and a Type B aortic dissection which was managed conservatively at the time. She had no other cardiac issues. Her aortic dissection has healed. She has been followed with Dr. Morton Peters for her aortic dissection. She was admitted to [facility] on December 5th, 2011 with volume overload and pulmonary edema. Echo showed EF of 25%. Cardiac cath on 10/06/10 with no evidence of coronary artery disease. It was felt that this may be related to her chemotherapy. She had a DVT in her left leg December 2011. ICD was placed September 2012 by Dr. Johney Frame. LV lead placement was unsuccessful. BiV-ICD box was implanted in hopes that LV lead placement could be re-attempted. She was eventually brought in in 09/2011 for left thoracotomy and successful epicardial LV lead placement with Dr. Donata Clay. That admission was complicated by AFib with RVR that resulted in inappropriate ICD discharges. She required amiodarone due to hypotension and eventual emergent DCCV. Coumadin was deferred at that time due to occurrence in post op setting. 2D Echo 09/19/11: Mild LVH, EF 15%, mild LAE, PASP 37. She was seen in followup by Dr. Johney Frame 12/12/11 and most recently by Tereso Newcomer PA-C in March 2013. Her lasix has been lowered because of symptomatic hypotension.   She has been feeling well. No chest pain or SOB. Her weight has been stable.   Primary Care Physician: Shary Decamp   Past Medical History  Diagnosis Date  . Nonischemic cardiomyopathy     Admission 12/11 with EF 25%, normal coronary arteries by cath 12/11; NICM presumed to be secondary to chemotherapy for breast cancer  . DVT (deep venous thrombosis) 10/18/2010  . Obesity     . 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 Glaucoma   . Depression   . Breast cancer 07/2009    Stage 3 lumpectomy, radiation, chemotherapy; finished chemo October, felt to be in remission  . History of shingles   . Hyperlipidemia   . Chronic systolic dysfunction of left ventricle   . Left bundle branch block   . Cardiac defibrillator in place     St. Jude (JA) 9/12; LV lead placement unsuccessful;  s/p left thoracotomy with placement of epicardial LV lead 09/2011 (Dr. PVT)  . [name]     with exertion  . Hypertension   . CHF (congestive heart failure)   . GERD (gastroesophageal reflux disease)   . Arthritis     lower back  . Hiatal hernia     small hiatal hernia  . Atrial fibrillation     post op (left thoracotomy) 11/12 req amio/DCCV    Past Surgical History  Procedure Date  . Tubal ligation     Bilateral  . Oophorectomy     Single  . Breast lumpectomy     Right breast  . Tonsillectomy   . Cardiac defibrillator placement   . Cardiac catheterization     2011  . Thoracotomy 09/16/2011    Procedure: THORACOTOMY MAJOR;  Surgeon: Kathlee Nations Suann Larry, MD;  Location: [facility] OR;  Service: Thoracic;  Laterality: Left;  left anterolateral Thoracotomy for placement of St. Jude epicardial pacing lead  Current Outpatient Prescriptions  Medication Sig Dispense Refill  . albuterol (PROVENTIL HFA;VENTOLIN HFA) 108 (90 BASE) MCG/ACT inhaler Inhale 2 puffs into the lungs every 4 (four) hours as needed. [name]       . albuterol (PROVENTIL) (2.5 MG/3ML) 0.083% nebulizer solution Take 2.5 mg by nebulization as needed. [name]       . ALPRAZolam (XANAX) 1 MG tablet Take 1 mg by mouth as needed. Anxiety       . aspirin 81 MG EC tablet Take 81 mg by mouth daily.        . Bepotastine Besilate (BEPREVE) 1.5 % SOLN Place 1 drop into both eyes as needed. Dry eyes       . bimatoprost (LUMIGAN) 0.03 % ophthalmic solution Place 1 drop into both eyes at  bedtime.        . Calcium Carbonate-Vitamin D (CALCIUM-VITAMIN D3) 600-200 MG-UNIT TABS Take 1 tablet by mouth 2 (two) times daily.        . carvedilol (COREG) 6.25 MG tablet Take 6.25 mg by mouth 2 (two) times daily with a meal.      . cholecalciferol (VITAMIN D) 1000 UNITS tablet Take 1,000 Units by mouth 2 (two) times daily.        . fexofenadine (ALLEGRA) 180 MG tablet Take 180 mg by mouth as needed. Allergies       . fluticasone (FLONASE) 50 MCG/ACT nasal spray Place 2 sprays into the nose as needed. Allergies       . furosemide (LASIX) 40 MG tablet Take one tablet every AM and half tablet every PM  45 tablet  3  . letrozole (FEMARA) 2.5 MG tablet Take 2.5 mg by mouth every evening.        . loratadine (CLARITIN) 10 MG tablet Take 10 mg by mouth daily as needed. For allergies       . montelukast (SINGULAIR) 10 MG tablet Take 10 mg by mouth as needed. For allergies & congestion      . Multiple Vitamin (MULTIVITAMIN) tablet Take 1 tablet by mouth daily.        . niacin (NIASPAN) 1000 MG CR tablet Take 1,000 mg by mouth at bedtime.        [name] 20 MG capsule Take 20 mg by mouth 2 (two) times daily.        [name] oxyCODONE-acetaminophen (PERCOCET) 5-325 MG per tablet Take 1 tablet by mouth every 4 (four) hours as needed.      [name] PARoxetine (PAXIL) 10 MG tablet Take 20 mg by mouth every morning.       [name] (SYSTANE OP) Apply to eye as needed.      . potassium chloride (MICRO-K) 10 MEQ CR capsule TAKE 2 CAPSULES BY MOUTH TWICE A DAY.  120 capsule  3  . potassium chloride SA (K-DUR,KLOR-CON) 20 MEQ tablet Take 2 tablets (40 mEq total) by mouth daily.      [name] senna-docusate (SENOKOT-S) 8.6-50 MG per tablet Take 1 tablet by mouth daily.        . simvastatin (ZOCOR) 20 MG tablet Take 20 mg by mouth at bedtime.        . traMADol (ULTRAM) 50 MG tablet Take 1-2 tablets (50-100 mg total) by mouth every 4 (four) hours as needed for pain. Maximum dose= 8 tablets per  day  40 tablet  0  . valsartan (DIOVAN) 40 MG tablet Take 1 tablet (40 mg total) by mouth  daily.  30 tablet  3  . [DISCONTINUED] carvedilol (COREG) 6.25 MG tablet TAKE 1 AND 1/2 (HALF) TABLETS TWICE DAILY  90 tablet  11  . [DISCONTINUED] ranitidine (ZANTAC) 300 MG capsule Take 300 mg by mouth daily as needed. Acid indigestion        Allergies  Allergen Reactions  . Tape     Blister from clear tapes.    History   Social History  . Marital Status: Married    Spouse Name: N/A    Number of Children: N/A  . Years of Education: N/A   Occupational History  . [employer]  . Retired from Photographer    Social History Main Topics  . Smoking status: Never Smoker   . Smokeless tobacco: Never Used  . Alcohol Use: No  . Drug Use: No  . Sexually Active: Not on file   Other Topics Concern  . 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    Family History  Problem Relation Age of Onset  . Hypertension Mother   . Hypertension Father   . Hypertension Brother   . Hypertension Maternal Grandfather   . Heart attack Maternal Grandfather     MI  . Coronary artery disease Maternal Grandfather   . Hypertension Paternal Grandfather   . Heart attack Paternal Grandfather     MI  . Coronary artery disease Paternal Grandfather   . Hypertension Brother     Review of Systems:  As stated in the HPI and otherwise negative.   BP 110/68  Pulse 78  Ht 5' 2.5" (1.588 m)  Wt 228 lb (103.42 kg)  BMI 41.04 kg/m2  SpO2 98%  Physical Examination: General: Well developed, well nourished, NAD HEENT: OP clear, mucus membranes moist SKIN: warm, dry. No rashes. Neuro: No focal deficits Musculoskeletal: Muscle strength 5/5 all ext Psychiatric: Mood and affect normal Neck: No JVD, no carotid bruits, no thyromegaly, no  lymphadenopathy. Lungs:Clear bilaterally, no wheezes, rhonci, crackles Cardiovascular: Regular rate and rhythm. No murmurs, gallops or rubs. Abdomen:Soft. Bowel sounds present. Non-tender.  Extremities: No lower extremity edema. Pulses are 2 + in the bilateral DP/PT.  Assessment and Plan:   1. Non-ischemic cardiomyopathy: She is doing well.  Doing well with current dose of Lasix and Coreg. No changes today. She is following weights at home which are stable. I have discussed referral to see Dr. Gala Romney in the CHF clinic given her severe LV dysfunction. She would like to set this up. ICD followed by Dr. Johney Frame.   2. A-fib: Appears to be in sinus today. No changes. Off of amiodarone.   3. Chronic systolic heart failure: Volume status is ok. Will continue Lasix. She will follow weights ta home.

## 2012-09-24 ENCOUNTER — Ambulatory Visit: Payer: PRIVATE HEALTH INSURANCE | Admitting: Cardiovascular Disease

## 2012-09-24 ENCOUNTER — Ambulatory Visit (INDEPENDENT_AMBULATORY_CARE_PROVIDER_SITE_OTHER): Payer: PRIVATE HEALTH INSURANCE | Admitting: *Deleted

## 2012-09-24 ENCOUNTER — Encounter: Payer: Self-pay | Admitting: Internal Medicine

## 2012-09-24 DIAGNOSIS — I5022 Chronic systolic (congestive) heart failure: Secondary | ICD-10-CM

## 2012-09-24 DIAGNOSIS — I428 Other cardiomyopathies: Secondary | ICD-10-CM

## 2012-09-24 DIAGNOSIS — Z95810 Presence of automatic (implantable) cardiac defibrillator: Secondary | ICD-10-CM

## 2012-09-26 ENCOUNTER — Encounter (HOSPITAL_COMMUNITY): Payer: Self-pay

## 2012-09-26 ENCOUNTER — Ambulatory Visit (HOSPITAL_COMMUNITY)
Admission: RE | Admit: 2012-09-26 | Discharge: 2012-09-26 | Disposition: A | Discharge status: Home / Self Care | Payer: PRIVATE HEALTH INSURANCE | Source: Ambulatory Visit | Attending: Internal Medicine | Admitting: Internal Medicine

## 2012-09-26 VITALS — BP 82/58 | HR 86 | Ht 62.0 in | Wt 228.0 lb

## 2012-09-26 DIAGNOSIS — I959 Hypotension, unspecified: Secondary | ICD-10-CM | POA: Insufficient documentation

## 2012-09-26 DIAGNOSIS — I5022 Chronic systolic (congestive) heart failure: Secondary | ICD-10-CM | POA: Insufficient documentation

## 2012-09-26 LAB — REMOTE ICD DEVICE
AL IMPEDENCE ICD: 410 Ohm
BAMS-0001: 180 {beats}/min
DEVICE MODEL ICD: 7004282
HV IMPEDENCE: 82 Ohm
LV LEAD IMPEDENCE ICD: 330 Ohm
RV LEAD AMPLITUDE: 12 mv

## 2012-09-26 MED ORDER — DIGOXIN 250 MCG PO TABS: 0.2500 mg | ORAL_TABLET | Freq: Every day | ORAL | Status: DC

## 2012-09-26 NOTE — Assessment & Plan Note (Addendum)
[name] presents with Class IIIb symptoms.  She has recently had difficulty with hypotension and her HF meds have been reduced.  Currently renal function remains stable but appears she may be reaching a point to consider advanced therapies.  Have discussed LVAD vs transplant with the patient and her husband.  Currently she is not a transplant candidate due to breast cancer within the last 3 years.  Will order CPX testing to evaluate function for further consideration of advanced therapies.  Currently no room for med titration.  Have discussed use of sliding scale lasix.  She will call if she has any questions.  Will follow closely.  Patient seen and examined with Ulyess Blossom, PA-C. We discussed all aspects of the encounter. I agree with the assessment and plan as stated above.  [name] has advanced HF symptoms with severely depressed EF and intolerance to low-dose meds. We had a very frank talk about her options and I suspect she will need LVAD in near future. We discussed this technology at length and also introduced her to a current VAD patient in clinic. We will proceed with CPX testing in the near future to further evaluate. If CPX confirm high-risk would pursue VAD work-up promptly as symptoms have been steadily progressing. Total time spent 70 minutes.

## 2012-09-26 NOTE — Assessment & Plan Note (Signed)
Asymptomatic.  Will not titrate meds today.  She will call if symptoms occur.  Have also discussed cutting back lasix if symptomatic or weight drops below 220 pounds.

## 2012-09-26 NOTE — Progress Notes (Signed)
Primary Cardiologist: Dr. Clifton James PCP: Dr. Shary Decamp Oncologist: Dr. Amie Critchley at [facility] EP: Dr. Johney Frame  Weight Range   220-224 pounds  Baseline proBNP     HPI:  [name] is a 61 y.o. female a cardiac history that includes hypertension, Type B aortic dissection (2010) managed with medical therapy by Dr. Donata Clay and has healed, LBBB, systolic heart failure due to NICM with EF 10-15% in May 2013 which has decreased 65% in 2010.  Cath Dec 2011 showed normal arteries.  She was diagnosed with breast cancer in 2009 s/p lumpectomy, XRT, and chemotherapy.  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   She underwent St Jude Bi-V ICD implantation Sept 2012 but unable to place LV lead.  She eventually brought back in Nov 2012 for left thoracotomy and placement of epicardial LV lead placement by Dr. Donata Clay.  She also had a sleep study in Jan that showed mild OSA/hypopnea syndrome.  AHI of 6 events per hour with O2 desat as low as 85%.  No clinical intervention occurred.    She has been referred by Dr. Clifton James for further evaluation of her systolic heart failure.  She feels like she is slowing down over the last couple of years but feels the best now than she has in awhile.  Total 4 hours needs to lay.  Walk flight of steps (8-9) steps before stopping.  No orthtopnea/PND.  [employer] on disability.  Husband is a IT trainer and travels during the day but home every night.  Had dizziness in October due to hypotension (SBP 70-80s).  220.8-222 pounds at home.    Last Cr 0.9 at PCP in Oct 2013   ROS: All systems negative except as listed in HPI, PMH and Problem List.  Past Medical History  Diagnosis Date  . Nonischemic cardiomyopathy     Admission 12/11 with EF 25%, normal coronary arteries by cath 12/11; NICM presumed to be secondary to chemotherapy for breast cancer  . DVT (deep venous thrombosis) 10/18/2010  . Obesity     . 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 Glaucoma(365)   . Depression   . Breast cancer 07/2009    Stage 3 lumpectomy, radiation, chemotherapy; finished chemo October, felt to be in remission  . History of shingles   . Hyperlipidemia   . Chronic systolic dysfunction of left ventricle   . Left bundle branch block   . Cardiac defibrillator in place     St. Jude (JA) 9/12; LV lead placement unsuccessful;  s/p left thoracotomy with placement of epicardial LV lead 09/2011 (Dr. PVT)  . [name]     with exertion  . Hypertension   . CHF (congestive heart failure)   . GERD (gastroesophageal reflux disease)   . Arthritis     lower back  . Hiatal hernia     small hiatal hernia  . Atrial fibrillation     post op (left thoracotomy) 11/12 req amio/DCCV    Current Outpatient Prescriptions  Medication Sig Dispense Refill  . albuterol (PROVENTIL HFA;VENTOLIN HFA) 108 (90 BASE) MCG/ACT inhaler Inhale 2 puffs into the lungs every 4 (four) hours as needed. [name]       . albuterol (PROVENTIL) (2.5 MG/3ML) 0.083% nebulizer solution Take 2.5 mg by nebulization as needed. [name]       . ALPRAZolam (XANAX) 1 MG tablet Take 1 mg by mouth  as needed. Anxiety       . aspirin 81 MG EC tablet Take 81 mg by mouth daily.        . Bepotastine Besilate (BEPREVE) 1.5 % SOLN Place 1 drop into both eyes as needed. Dry eyes       . bimatoprost (LUMIGAN) 0.03 % ophthalmic solution Place 1 drop into both eyes at bedtime.        . Calcium Carbonate-Vitamin D (CALCIUM-VITAMIN D3) 600-200 MG-UNIT TABS Take 1 tablet by mouth 2 (two) times daily.        . carvedilol (COREG) 6.25 MG tablet Take 6.25 mg by mouth 2 (two) times daily with a meal.      . cholecalciferol (VITAMIN D) 1000 UNITS tablet Take 1,000 Units by mouth 2 (two) times daily.        . fexofenadine (ALLEGRA) 180 MG tablet Take 180 mg by mouth as needed. Allergies       . fluticasone (FLONASE) 50 MCG/ACT nasal spray  Place 2 sprays into the nose as needed. Allergies       . furosemide (LASIX) 40 MG tablet Take one tablet every AM and half tablet every PM  45 tablet  3  . letrozole (FEMARA) 2.5 MG tablet Take 2.5 mg by mouth every evening.        . loratadine (CLARITIN) 10 MG tablet Take 10 mg by mouth daily as needed. For allergies       . montelukast (SINGULAIR) 10 MG tablet Take 10 mg by mouth as needed. For allergies & congestion      . Multiple Vitamin (MULTIVITAMIN) tablet Take 1 tablet by mouth daily.        . niacin (NIASPAN) 1000 MG CR tablet Take 1,000 mg by mouth at bedtime.        [name] 20 MG capsule Take 20 mg by mouth 2 (two) times daily.        [name] oxyCODONE-acetaminophen (PERCOCET) 5-325 MG per tablet Take 1 tablet by mouth every 4 (four) hours as needed.      [name] PARoxetine (PAXIL) 10 MG tablet Take 20 mg by mouth every morning.       [name] (SYSTANE OP) Apply to eye as needed.      . potassium chloride (MICRO-K) 10 MEQ CR capsule TAKE 2 CAPSULES BY MOUTH TWICE A DAY.  120 capsule  3  . potassium chloride SA (K-DUR,KLOR-CON) 20 MEQ tablet Take 2 tablets (40 mEq total) by mouth daily.      [name] senna-docusate (SENOKOT-S) 8.6-50 MG per tablet Take 1 tablet by mouth daily.        . simvastatin (ZOCOR) 20 MG tablet Take 20 mg by mouth at bedtime.        . traMADol (ULTRAM) 50 MG tablet Take 1-2 tablets (50-100 mg total) by mouth every 4 (four) hours as needed for pain. Maximum dose= 8 tablets per day  40 tablet  0  . valsartan (DIOVAN) 40 MG tablet Take 1 tablet (40 mg total) by mouth daily.  30 tablet  3  . [DISCONTINUED] ranitidine (ZANTAC) 300 MG capsule Take 300 mg by mouth daily as needed. Acid indigestion         PHYSICAL EXAM: Filed Vitals:   09/26/12 1027  BP: 82/58  Pulse: 86  Height: 5\' 2"  (1.575 m)  Weight: 228 lb (103.42 kg)  SpO2: 99%    General:  Well appearing. No resp difficulty HEENT: normal Neck: supple. JVP  flat. Carotids 2+  bilaterally; no bruits. No lymphadenopathy or thryomegaly appreciated. Cor: PMI normal. Regular rate & rhythm. No rubs, gallops or murmurs. Lungs: clear Abdomen: obese, soft, nontender, nondistended. No hepatosplenomegaly. No bruits or masses. Good bowel sounds. Extremities: no cyanosis, clubbing, rash, edema Neuro: alert & orientedx3, cranial nerves grossly intact. Moves all 4 extremities w/o difficulty. Affect pleasant.      ASSESSMENT & PLAN:

## 2012-09-26 NOTE — Progress Notes (Signed)
VAD Coordinator Note:  Met with [name] and her husband today about advanced therapy options. They met with one of our VAD patients and were able to see what the pump looked like and get some questions answered. I provided them with printed educational resources and a CD to take home and look over. We went over the major risks and complications for LVAD implantation. The patient will get scheduled for a CPX test and we will continue the work-up. I provided them with my pager for any questions that may come up. Will continue to follow up.

## 2012-09-26 NOTE — Patient Instructions (Addendum)
Add digoxin 0.25 daily.  Your physician has recommended that you have a cardiopulmonary stress test (CPX). CPX testing is a non-invasive measurement of heart and lung function. It replaces a traditional treadmill stress test. This type of test provides a tremendous amount of information that relates not only to your present condition but also for future outcomes. This test combines measurements of you ventilation, respiratory gas exchange in the lungs, electrocardiogram (EKG), blood pressure and physical response before, during, and following an exercise protocol.  Follow up 3 weeks with Dr. Gala Romney

## 2012-10-10 ENCOUNTER — Ambulatory Visit (HOSPITAL_COMMUNITY): Payer: PRIVATE HEALTH INSURANCE | Attending: Internal Medicine

## 2012-10-10 DIAGNOSIS — I5022 Chronic systolic (congestive) heart failure: Secondary | ICD-10-CM

## 2012-10-15 ENCOUNTER — Telehealth: Payer: Self-pay | Admitting: Internal Medicine

## 2012-10-15 NOTE — Telephone Encounter (Signed)
Pt wants to come in Friday or Wednesday for the adjustment Baxter Hire was calling about if at all possible because she will be in town. Call her cell number if no answer at home.

## 2012-10-15 NOTE — Telephone Encounter (Signed)
Left message for patient to call back so we can schedule her for Wednesday.

## 2012-10-17 ENCOUNTER — Encounter (HOSPITAL_COMMUNITY): Payer: PRIVATE HEALTH INSURANCE

## 2012-10-19 ENCOUNTER — Ambulatory Visit (HOSPITAL_COMMUNITY)
Admission: RE | Admit: 2012-10-19 | Discharge: 2012-10-19 | Disposition: A | Discharge status: Home / Self Care | Payer: PRIVATE HEALTH INSURANCE | Source: Ambulatory Visit | Attending: Internal Medicine | Admitting: Internal Medicine

## 2012-10-19 VITALS — BP 102/58 | HR 70 | Wt 232.5 lb

## 2012-10-19 DIAGNOSIS — I5022 Chronic systolic (congestive) heart failure: Secondary | ICD-10-CM

## 2012-10-19 LAB — BASIC METABOLIC PANEL
Chloride: 104 mEq/L (ref 96–112)
GFR calc Af Amer: >90 mL/min (ref >90)
GFR calc non Af Amer: >90 mL/min (ref >90)
Glucose, Bld: 106 mg/dL — ABNORMAL HIGH (ref 70–99)
Potassium: 3.9 mEq/L (ref 3.5–5.1)
Sodium: 142 mEq/L (ref 135–145)

## 2012-10-19 MED ORDER — VALSARTAN 40 MG PO TABS: 40.0000 mg | ORAL_TABLET | Freq: Two times a day (BID) | ORAL | Status: DC

## 2012-10-19 NOTE — Assessment & Plan Note (Addendum)
NYHA IIIb. Volume status stable. CPX results reviewed and discussed. Peak VO2 marked depression noted with  mixed limitation noted related to heart failure and obesity. Will schedule to RHC to further evaluate hemodynamics. She is not a candidate for transplant given breast cancer and BMI 41. Discussed options such as medication titration versus RHC to further evaluate. At this time she would like to titrate medications and hold off RHC. Increase diovan to 40 mg twice a day. Check BMET today. Follow up in 3 weeks.   Patient seen and examined with Tonye Becket, NP. We discussed all aspects of the encounter. I agree with the assessment and plan as stated above.  Her functional capacity seems to have improved some. Now NYHA III. Volume status looks good. We reviewed CPX at length. It shows markedly reduced pVO2 due to a mixed defect (HF and obesity). Her slope is normal and when corrected for her weight VO2 looks considerably better, though certainly not normal. We talked about proceeding to RHC or watchful waiting. Given her recent improvement and mixed results on CPX we have opted for watchful waiting. Will continue to titrate meds aggressively. Increase Diovan. Reinforced need for daily weights and reviewed use of sliding scale diuretics.

## 2012-10-19 NOTE — Patient Instructions (Addendum)
Take Diovan 40 mg twice a day  Do the following things EVERYDAY: 1) Weigh yourself in the morning before breakfast. Write it down and keep it in a log. 2) Take your medicines as prescribed 3) Eat low salt foods-Limit salt (sodium) to 2000 mg per day.  4) Stay as active as you can everyday 5) Limit all fluids for the day to less than 2 liters  Follow up in 3 weeks

## 2012-10-19 NOTE — Progress Notes (Signed)
Patient ID: [name], female   DOB: 1951/01/06, 61 y.o.   MRN: [id number] Primary Cardiologist: Dr. Clifton James PCP: Dr. Shary Decamp Oncologist: Dr. Amie Critchley at [facility] EP: Dr. Johney Frame  Weight Range   220-224 pounds  Baseline proBNP     HPI:  [name] is a 61 y.o. female a cardiac history that includes hypertension, Type B aortic dissection (2010) managed with medical therapy by Dr. Donata Clay and has healed, LBBB, systolic heart failure due to NICM with EF 10-15% in May 2013 which has decreased 65% in 2010.  Cath Dec 2011 showed normal arteries.  She was diagnosed with breast cancer in 2009 s/p lumpectomy, XRT, and chemotherapy.  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   She underwent St Jude Bi-V ICD implantation Sept 2012 but unable to place LV lead.  She eventually brought back in Nov 2012 for left thoracotomy and placement of epicardial LV lead placement by Dr. Donata Clay.  She also had a sleep study in Jan that showed mild OSA/hypopnea syndrome.  AHI of 6 events per hour with O2 desat as low as 85%.  No clinical intervention occurred.    10/10/2012 Peak VO2: 10.6 % predicted peak VO2: 64.4% VE/VCO2 slope: 26.9 OUES: 1.18 Peak RER: 1.29 Ventilatory Threshold: 7.3 % predicted peak VO2: 44.4% Peak RR 23 Peak Ventilation: 29.8 VE/MVV: 32.6% PETCO2 at peak: 43    She returns for follow up with her daughter and Mother. Last visit digoxin added. SBP at home 100-110. Complains of fatigue with ADLs. Dyspnea with exertion. Able to walk up 2 flights of stairs. Requires multiple rest breaks during th day.  Denies orthopnea/CP/PND. Weight at home 220-224 pounds. Compliant with medications     ROS: All systems negative except as listed in HPI, PMH and Problem List.  Past Medical History  Diagnosis Date  . Nonischemic cardiomyopathy     Admission 12/11 with EF 25%, normal coronary arteries by cath 12/11; NICM presumed to  be secondary to chemotherapy for breast cancer  . DVT (deep venous thrombosis) 10/18/2010  . Obesity   . 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 Glaucoma(365)   . Depression   . Breast cancer 07/2009    Stage 3 lumpectomy, radiation, chemotherapy; finished chemo October, felt to be in remission  . History of shingles   . Hyperlipidemia   . Chronic systolic dysfunction of left ventricle   . Left bundle branch block   . Cardiac defibrillator in place     St. Jude (JA) 9/12; LV lead placement unsuccessful;  s/p left thoracotomy with placement of epicardial LV lead 09/2011 (Dr. PVT)  . [name]     with exertion  . Hypertension   . CHF (congestive heart failure)   . GERD (gastroesophageal reflux disease)   . Arthritis     lower back  . Hiatal hernia     small hiatal hernia  . Atrial fibrillation     post op (left thoracotomy) 11/12 req amio/DCCV    Current Outpatient Prescriptions  Medication Sig Dispense Refill  . albuterol (PROVENTIL HFA;VENTOLIN HFA) 108 (90 BASE) MCG/ACT inhaler Inhale 2 puffs into the lungs every 4 (four) hours as needed. [name]       . albuterol (PROVENTIL) (2.5 MG/3ML) 0.083% nebulizer solution Take 2.5 mg by nebulization as needed. [name]       . ALPRAZolam (XANAX) 1 MG tablet Take  1 mg by mouth as needed. Anxiety       . aspirin 81 MG EC tablet Take 81 mg by mouth daily.        . Bepotastine Besilate (BEPREVE) 1.5 % SOLN Place 1 drop into both eyes as needed. Dry eyes       . bimatoprost (LUMIGAN) 0.03 % ophthalmic solution Place 1 drop into both eyes at bedtime.        . Calcium Carbonate-Vitamin D (CALCIUM-VITAMIN D3) 600-200 MG-UNIT TABS Take 1 tablet by mouth 2 (two) times daily.        . carvedilol (COREG) 6.25 MG tablet Take 6.25 mg by mouth 2 (two) times daily with a meal.      . cholecalciferol (VITAMIN D) 1000 UNITS tablet Take 1,000 Units by mouth 2 (two) times daily.        . digoxin (LANOXIN) 0.25 MG  tablet Take 1 tablet (0.25 mg total) by mouth daily.  30 tablet  6  . fexofenadine (ALLEGRA) 180 MG tablet Take 180 mg by mouth as needed. Allergies       . fluticasone (FLONASE) 50 MCG/ACT nasal spray Place 2 sprays into the nose as needed. Allergies       . furosemide (LASIX) 40 MG tablet Take one tablet every AM and half tablet every PM  45 tablet  3  . letrozole (FEMARA) 2.5 MG tablet Take 2.5 mg by mouth every evening.        . loratadine (CLARITIN) 10 MG tablet Take 10 mg by mouth daily as needed. For allergies       . montelukast (SINGULAIR) 10 MG tablet Take 10 mg by mouth as needed. For allergies & congestion      . Multiple Vitamin (MULTIVITAMIN) tablet Take 1 tablet by mouth daily.        . niacin (NIASPAN) 1000 MG CR tablet Take 1,000 mg by mouth at bedtime.        [name] 20 MG capsule Take 20 mg by mouth 2 (two) times daily.        [name] oxyCODONE-acetaminophen (PERCOCET) 5-325 MG per tablet Take 1 tablet by mouth every 4 (four) hours as needed.      [name] PARoxetine (PAXIL) 10 MG tablet Take 20 mg by mouth every morning.       [name] (SYSTANE OP) Apply to eye as needed.      . potassium chloride SA (K-DUR,KLOR-CON) 20 MEQ tablet Take 40 mEq by mouth 2 (two) times daily.      [name] senna-docusate (SENOKOT-S) 8.6-50 MG per tablet Take 1 tablet by mouth daily.        . simvastatin (ZOCOR) 20 MG tablet Take 20 mg by mouth at bedtime.        . valsartan (DIOVAN) 40 MG tablet Take 1 tablet (40 mg total) by mouth daily.  30 tablet  3  . [DISCONTINUED] ranitidine (ZANTAC) 300 MG capsule Take 300 mg by mouth daily as needed. Acid indigestion         PHYSICAL EXAM: Filed Vitals:   10/19/12 1014  BP: 102/58  Pulse: 70  Weight: 232 lb 8 oz (105.461 kg)  SpO2: 98%    General:  Well appearing. No resp difficulty Daughter and Mother present HEENT: normal Neck: supple. JVP flat. Carotids 2+ bilaterally; no bruits. No lymphadenopathy or thryomegaly  appreciated. Cor: PMI normal. Regular rate & rhythm. No rubs, gallops or murmurs. Lungs: clear Abdomen: obese, soft, nontender, nondistended.  No hepatosplenomegaly. No bruits or masses. Good bowel sounds. Extremities: no cyanosis, clubbing, rash, edema Neuro: alert & orientedx3, cranial nerves grossly intact. Moves all 4 extremities w/o difficulty. Affect pleasant.      ASSESSMENT & PLAN:

## 2012-11-16 ENCOUNTER — Ambulatory Visit (HOSPITAL_COMMUNITY)
Admission: RE | Admit: 2012-11-16 | Discharge: 2012-11-16 | Disposition: A | Discharge status: Home / Self Care | Payer: PRIVATE HEALTH INSURANCE | Source: Ambulatory Visit | Attending: Internal Medicine | Admitting: Internal Medicine

## 2012-11-16 ENCOUNTER — Encounter (HOSPITAL_COMMUNITY): Payer: Self-pay

## 2012-11-16 VITALS — BP 98/70 | HR 75 | Wt 229.8 lb

## 2012-11-16 DIAGNOSIS — I5022 Chronic systolic (congestive) heart failure: Secondary | ICD-10-CM | POA: Insufficient documentation

## 2012-11-16 LAB — BASIC METABOLIC PANEL
Chloride: 100 mEq/L (ref 96–112)
GFR calc Af Amer: 85 mL/min — ABNORMAL LOW (ref >90)
GFR calc non Af Amer: 74 mL/min — ABNORMAL LOW (ref >90)
Glucose, Bld: 113 mg/dL — ABNORMAL HIGH (ref 70–99)
Potassium: 3.9 mEq/L (ref 3.5–5.1)
Sodium: 139 mEq/L (ref 135–145)

## 2012-11-16 NOTE — Patient Instructions (Addendum)
Continue current therapy.  Follow up 4-6 weeks.

## 2012-11-16 NOTE — Progress Notes (Signed)
Primary Cardiologist: Dr. Clifton James PCP: Dr. Shary Decamp Oncologist: Dr. Amie Critchley at [facility] EP: Dr. Johney Frame  Weight Range   220-224 pounds  Baseline proBNP     HPI:  [name] is a 62 y.o. female a cardiac history that includes hypertension, Type B aortic dissection (2010) managed with medical therapy by Dr. Donata Clay and has healed, LBBB, systolic heart failure due to NICM with EF 10-15% in May 2013 which has decreased 65% in 2010.  Cath Dec 2011 showed normal arteries.  She was diagnosed with breast cancer in 2009 s/p lumpectomy, XRT, and chemotherapy.  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   She underwent St Jude Bi-V ICD implantation Sept 2012 but unable to place LV lead.  She eventually brought back in Nov 2012 for left thoracotomy and placement of epicardial LV lead placement by Dr. Donata Clay.  She also had a sleep study in Jan that showed mild OSA/hypopnea syndrome.  AHI of 6 events per hour with O2 desat as low as 85%.  No clinical intervention occurred.    10/10/2012 Peak VO2: 10.6 % predicted peak VO2: 64.4% VE/VCO2 slope: 26.9 OUES: 1.18 Peak RER: 1.29 Ventilatory Threshold: 7.3 % predicted peak VO2: 44.4% Peak RR 23 Peak Ventilation: 29.8 VE/MVV: 32.6% PETCO2 at peak: 43  She returns for follow up today with her daughter.  She feels well.  She continues to feel fatigue after about 4 hours of activity.  After she rest for 1 hours she usually feels better.  She denies orthopnea/PND.  No dizziness/syncope.  Her weight is 222-224 pounds.  No extra lasix.  SBP low 100s at home.  She has been having redness/burning after taking naicin.  She said her niacin has been changed from brand to generic andshe thinks this is the cause. She would like to start cardiac rehab again.       ROS: All systems negative except as listed in HPI, PMH and Problem List.  Past Medical History  Diagnosis Date  . Nonischemic  cardiomyopathy     Admission 12/11 with EF 25%, normal coronary arteries by cath 12/11; NICM presumed to be secondary to chemotherapy for breast cancer  . DVT (deep venous thrombosis) 10/18/2010  . Obesity   . 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 Glaucoma(365)   . Depression   . Breast cancer 07/2009    Stage 3 lumpectomy, radiation, chemotherapy; finished chemo October, felt to be in remission  . History of shingles   . Hyperlipidemia   . Chronic systolic dysfunction of left ventricle   . Left bundle branch block   . Cardiac defibrillator in place     St. Jude (JA) 9/12; LV lead placement unsuccessful;  s/p left thoracotomy with placement of epicardial LV lead 09/2011 (Dr. PVT)  . [name]     with exertion  . Hypertension   . CHF (congestive heart failure)   . GERD (gastroesophageal reflux disease)   . Arthritis     lower back  . Hiatal hernia     small hiatal hernia  . Atrial fibrillation     post op (left thoracotomy) 11/12 req amio/DCCV    Current Outpatient Prescriptions  Medication Sig Dispense Refill  . albuterol (PROVENTIL HFA;VENTOLIN HFA) 108 (90 BASE) MCG/ACT inhaler Inhale 2 puffs into the lungs every 4 (four) hours as needed. [name]       . albuterol (PROVENTIL) (2.5 MG/3ML)  0.083% nebulizer solution Take 2.5 mg by nebulization as needed. [name]       . ALPRAZolam (XANAX) 1 MG tablet Take 1 mg by mouth as needed. Anxiety       . aspirin 81 MG EC tablet Take 81 mg by mouth daily.        . Bepotastine Besilate (BEPREVE) 1.5 % SOLN Place 1 drop into both eyes as needed. Dry eyes       . bimatoprost (LUMIGAN) 0.03 % ophthalmic solution Place 1 drop into both eyes at bedtime.        . Calcium Carbonate-Vitamin D (CALCIUM-VITAMIN D3) 600-200 MG-UNIT TABS Take 1 tablet by mouth 2 (two) times daily.        . carvedilol (COREG) 6.25 MG tablet Take 6.25 mg by mouth 2 (two) times daily with a meal.      . cholecalciferol (VITAMIN D)  1000 UNITS tablet Take 1,000 Units by mouth 2 (two) times daily.        . digoxin (LANOXIN) 0.25 MG tablet Take 1 tablet (0.25 mg total) by mouth daily.  30 tablet  6  . fexofenadine (ALLEGRA) 180 MG tablet Take 180 mg by mouth as needed. Allergies       . fluticasone (FLONASE) 50 MCG/ACT nasal spray Place 2 sprays into the nose as needed. Allergies       . furosemide (LASIX) 40 MG tablet Take one tablet every AM and half tablet every PM  45 tablet  3  . letrozole (FEMARA) 2.5 MG tablet Take 2.5 mg by mouth every evening.        . loratadine (CLARITIN) 10 MG tablet Take 10 mg by mouth daily as needed. For allergies       . montelukast (SINGULAIR) 10 MG tablet Take 10 mg by mouth as needed. For allergies & congestion      . Multiple Vitamin (MULTIVITAMIN) tablet Take 1 tablet by mouth daily.        . niacin (NIASPAN) 1000 MG CR tablet Take 1,000 mg by mouth at bedtime.        [name] 20 MG capsule Take 20 mg by mouth 2 (two) times daily.        [name] oxyCODONE-acetaminophen (PERCOCET) 5-325 MG per tablet Take 1 tablet by mouth every 4 (four) hours as needed.      [name] PARoxetine (PAXIL) 10 MG tablet Take 20 mg by mouth every morning.       [name] (SYSTANE OP) Apply to eye as needed.      . potassium chloride SA (K-DUR,KLOR-CON) 20 MEQ tablet Take 40 mEq by mouth 2 (two) times daily.      [name] senna-docusate (SENOKOT-S) 8.6-50 MG per tablet Take 1 tablet by mouth daily.        . simvastatin (ZOCOR) 20 MG tablet Take 20 mg by mouth at bedtime.        . valsartan (DIOVAN) 40 MG tablet Take 1 tablet (40 mg total) by mouth 2 (two) times daily.  60 tablet  3  . [DISCONTINUED] ranitidine (ZANTAC) 300 MG capsule Take 300 mg by mouth daily as needed. Acid indigestion         PHYSICAL EXAM: Filed Vitals:   11/16/12 0932  BP: 98/70  Pulse: 75  Weight: 229 lb 12.8 oz (104.237 kg)  SpO2: 97%    General:  Well appearing. No resp difficulty Daughter and Mother  present HEENT: normal Neck: supple. JVP flat.  Carotids 2+ bilaterally; no bruits. No lymphadenopathy or thryomegaly appreciated. Cor: PMI normal. Regular rate & rhythm. No rubs, gallops or murmurs. Lungs: clear Abdomen: obese, soft, nontender, nondistended. No hepatosplenomegaly. No bruits or masses. Good bowel sounds. Extremities: no cyanosis, clubbing, rash, tr edema Neuro: alert & orientedx3, cranial nerves grossly intact. Moves all 4 extremities w/o difficulty. Affect pleasant.      ASSESSMENT & PLAN:

## 2012-11-16 NOTE — Assessment & Plan Note (Signed)
Volume status stable.  Functional status has remained unchanged since last visit.  Will continue to follow functional class closely and if any decline will proceed with RHC.  Continue current regimen.  BP soft therefore will not titrate this visit.  Checked BMET and digoxin level today.  Dig level 2.9 but patient denies dizziness or blurred vision.  HR stable.  Will stop digoxin and recheck next week.  Will continue to follow closely in clinic.

## 2012-11-27 ENCOUNTER — Telehealth (HOSPITAL_COMMUNITY): Payer: Self-pay | Admitting: *Deleted

## 2012-11-27 NOTE — Telephone Encounter (Signed)
Called pt with results from dig level done Fri 1/24 at [place], level <0.4 pt aware and will continue to hold digoxin, she states she feels much better since being off med

## 2012-12-12 ENCOUNTER — Encounter: Payer: Self-pay | Admitting: Internal Medicine

## 2012-12-12 ENCOUNTER — Ambulatory Visit (INDEPENDENT_AMBULATORY_CARE_PROVIDER_SITE_OTHER): Payer: PRIVATE HEALTH INSURANCE | Admitting: Internal Medicine

## 2012-12-12 VITALS — BP 104/70 | HR 72 | Ht 62.0 in | Wt 230.0 lb

## 2012-12-12 DIAGNOSIS — I5022 Chronic systolic (congestive) heart failure: Secondary | ICD-10-CM

## 2012-12-12 LAB — ICD DEVICE OBSERVATION
AL AMPLITUDE: 0.6 mv
AL THRESHOLD: 0.5 V
DEVICE MODEL ICD: 7004282
FVT: 0
HV IMPEDENCE: 80 Ohm
PACEART VT: 0
RV LEAD AMPLITUDE: 12 mv
RV LEAD IMPEDENCE ICD: 400 Ohm
TOT-0009: 1
TOT-0010: 6

## 2012-12-12 MED ORDER — CARVEDILOL 6.25 MG PO TABS: 6.2500 mg | ORAL_TABLET | Freq: Two times a day (BID) | ORAL | Status: DC

## 2012-12-12 MED ORDER — FUROSEMIDE 40 MG PO TABS: ORAL_TABLET | ORAL | Status: DC

## 2012-12-12 NOTE — Patient Instructions (Addendum)
Your physician wants you to follow-up in: 12 months with Dr Jacquiline Doe will receive a reminder letter in the mail two months in advance. If you don't receive a letter, please call our office to schedule the follow-up appointment.  Your physician has recommended that you have an AV optimization echo. During this procedure, an echocardiogram is performed to optimize the timing of your device using ultrasound and a device programmer. Changes will be made to the device settings to help the heart chambers pump more efficiently. This procedure takes approximately one hour.---St Jude

## 2012-12-12 NOTE — Assessment & Plan Note (Signed)
As above.

## 2012-12-12 NOTE — Progress Notes (Signed)
PCP:  Feliciana Rossetti, MD Primary Cardiologist:  Dr Clifton Liron Eissler, Also followed by the advanced heart failure clinic  The patient presents today for routine electrophysiology followup.  Since last being seen in our clinic, the patient reports doing well.  Her energy and SOB improved with CRT.  She continues to have SOB with moderate activity. Today, she denies symptoms of palpitations, chest pain, dizziness, presyncope, syncope, or neurologic sequela.  She has stable intermittent edema.  The patient feels that she is tolerating medications without difficulties and is otherwise without complaint today.   Past Medical History  Diagnosis Date  . Nonischemic cardiomyopathy     Admission 12/11 with EF 25%, normal coronary arteries by cath 12/11; NICM presumed to be secondary to chemotherapy for breast cancer  . DVT (deep venous thrombosis) 10/18/2010  . Obesity   . 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 Glaucoma(365)   . Depression   . Breast cancer 07/2009    Stage 3 lumpectomy, radiation, chemotherapy; finished chemo October, felt to be in remission  . History of shingles   . Hyperlipidemia   . Chronic systolic dysfunction of left ventricle   . Left bundle branch block   . Cardiac defibrillator in place     St. Jude (JA) 9/12; LV lead placement unsuccessful;  s/p left thoracotomy with placement of epicardial LV lead 09/2011 (Dr. PVT)  . [name]     with exertion  . Hypertension   . CHF (congestive heart failure)   . GERD (gastroesophageal reflux disease)   . Arthritis     lower back  . Hiatal hernia     small hiatal hernia  . Atrial fibrillation     post op (left thoracotomy) 11/12 req amio/DCCV   Past Surgical History  Procedure Laterality Date  . Tubal ligation      Bilateral  . Oophorectomy      Single  . Breast lumpectomy      Right breast  . Tonsillectomy    . Cardiac defibrillator placement    . Cardiac catheterization      2011  . Thoracotomy  09/16/2011   Procedure: THORACOTOMY MAJOR;  Surgeon: Kathlee Nations Suann Larry, MD;  Location: [facility] OR;  Service: Thoracic;  Laterality: Left;  left anterolateral Thoracotomy for placement of St. Jude epicardial pacing lead     Current Outpatient Prescriptions  Medication Sig Dispense Refill  . albuterol (PROVENTIL HFA;VENTOLIN HFA) 108 (90 BASE) MCG/ACT inhaler Inhale 2 puffs into the lungs every 4 (four) hours as needed. [name]       . albuterol (PROVENTIL) (2.5 MG/3ML) 0.083% nebulizer solution Take 2.5 mg by nebulization as needed. [name]       . ALPRAZolam (XANAX) 1 MG tablet Take 1 mg by mouth as needed. Anxiety       . aspirin 81 MG EC tablet Take 81 mg by mouth daily.        . Bepotastine Besilate (BEPREVE) 1.5 % SOLN Place 1 drop into both eyes as needed. Dry eyes       . bimatoprost (LUMIGAN) 0.03 % ophthalmic solution Place 1 drop into both eyes at bedtime.        . Calcium Carbonate-Vitamin D (CALCIUM-VITAMIN D3) 600-200 MG-UNIT TABS Take 1 tablet by mouth 2 (two) times daily.        . carvedilol (COREG) 6.25 MG tablet Take 6.25 mg by mouth 2 (two) times daily with a meal.      .  cholecalciferol (VITAMIN D) 1000 UNITS tablet Take 1,000 Units by mouth 2 (two) times daily.        . fexofenadine (ALLEGRA) 180 MG tablet Take 180 mg by mouth as needed. Allergies       . fluticasone (FLONASE) 50 MCG/ACT nasal spray Place 2 sprays into the nose as needed. Allergies       . furosemide (LASIX) 40 MG tablet Take one tablet every AM and half tablet every PM  45 tablet  3  . letrozole (FEMARA) 2.5 MG tablet Take 2.5 mg by mouth every evening.        . loratadine (CLARITIN) 10 MG tablet Take 10 mg by mouth daily as needed. For allergies       . montelukast (SINGULAIR) 10 MG tablet Take 10 mg by mouth as needed. For allergies & congestion      . Multiple Vitamin (MULTIVITAMIN) tablet Take 1 tablet by mouth daily.        . niacin (NIASPAN) 1000 MG CR tablet Take 1,000 mg by mouth at  bedtime.        [name] 20 MG capsule Take 20 mg by mouth 2 (two) times daily.        [name] oxyCODONE-acetaminophen (PERCOCET) 5-325 MG per tablet Take 1 tablet by mouth every 4 (four) hours as needed.      [name] PARoxetine (PAXIL) 10 MG tablet Take 20 mg by mouth every morning.       [name] (SYSTANE OP) Apply to eye as needed.      . potassium chloride SA (K-DUR,KLOR-CON) 20 MEQ tablet Take 40 mEq by mouth 2 (two) times daily.      [name] senna-docusate (SENOKOT-S) 8.6-50 MG per tablet Take 1 tablet by mouth daily.        . simvastatin (ZOCOR) 20 MG tablet Take 20 mg by mouth at bedtime.        . valsartan (DIOVAN) 40 MG tablet Take 1 tablet (40 mg total) by mouth 2 (two) times daily.  60 tablet  3  . [DISCONTINUED] ranitidine (ZANTAC) 300 MG capsule Take 300 mg by mouth daily as needed. Acid indigestion       No current facility-administered medications for this visit.    Allergies  Allergen Reactions  . Tape     Blister from clear tapes.    History   Social History  . Marital Status: Married    Spouse Name: N/A    Number of Children: N/A  . Years of Education: N/A   Occupational History  . [employer]  . Retired from Photographer    Social History Main Topics  . Smoking status: Never Smoker   . Smokeless tobacco: Never Used  . Alcohol Use: No  . Drug Use: No  . Sexually Active: Not on file   Other Topics Concern  . Not on file   Social History Narrative   Married   One child   Lives in [place] with spouse and mother in law   Previously worked as a Public librarian for [employer]      She is a Social worker counsel member.    Family History  Problem Relation Age of Onset  . Hypertension Mother   . Hypertension Father   . Hypertension Brother   . Hypertension Maternal Grandfather   . Heart attack Maternal Grandfather     MI  . Coronary artery disease Maternal Grandfather   . Hypertension Paternal  Grandfather   . Heart  attack Paternal Grandfather     MI  . Coronary artery disease Paternal Grandfather   . Hypertension Brother     Physical Exam: Filed Vitals:   12/12/12 1023  BP: 104/70  Pulse: 72  Height: 5\' 2"  (1.575 m)  Weight: 230 lb (104.327 kg)    GEN- The patient is well appearing, alert and oriented x 3 today.   Head- normocephalic, atraumatic Eyes-  Sclera clear, conjunctiva pink Ears- hearing intact Oropharynx- clear Neck- supple, no JVP Lymph- no cervical lymphadenopathy Lungs- Clear to ausculation bilaterally, normal work of breathing Chest- ICD pocket is well healed Heart- Regular rate and rhythm, no murmurs, rubs or gallops, PMI not laterally displaced GI- soft, NT, ND, + BS Extremities- no clubbing, cyanosis, or edema  ICD interrogation- reviewed in detail today,  See PACEART report  ekg today reveals sinus rhythm with BiV Pacing (QRS )  Assessment and Plan:

## 2012-12-12 NOTE — Assessment & Plan Note (Signed)
Stable No change required today  

## 2012-12-12 NOTE — Assessment & Plan Note (Signed)
Normal BiV ICD function Per quick opt recommendation, will change LV-->RV timing from 40 to 30 msec Given ongoing exertional SOB, I will arrange echo guided optimization of her BiV device See Arita Miss Art report   Return in 1 year

## 2012-12-28 ENCOUNTER — Ambulatory Visit (HOSPITAL_COMMUNITY)
Admission: RE | Admit: 2012-12-28 | Discharge: 2012-12-28 | Disposition: A | Discharge status: Home / Self Care | Payer: PRIVATE HEALTH INSURANCE | Source: Ambulatory Visit | Attending: Internal Medicine | Admitting: Internal Medicine

## 2012-12-28 VITALS — BP 106/74 | HR 84 | Wt 230.2 lb

## 2012-12-28 DIAGNOSIS — I5022 Chronic systolic (congestive) heart failure: Secondary | ICD-10-CM

## 2012-12-28 DIAGNOSIS — Z95810 Presence of automatic (implantable) cardiac defibrillator: Secondary | ICD-10-CM

## 2012-12-28 MED ORDER — CARVEDILOL 6.25 MG PO TABS: 9.3750 mg | ORAL_TABLET | Freq: Two times a day (BID) | ORAL | Status: DC

## 2012-12-28 NOTE — Assessment & Plan Note (Addendum)
NYHA III.  Volume status well controlled on current regimen.  Will continue to titrate carvedilol.  Increased 1.5 tablets at night and if stable will increase to 1.5 tablets in the morning.  Will continue to titrate meds and repeat echo.  Discussed low sodium diet and encouraged her to continue to weigh daily.  Will try to refer to cardiac rehab if her insurance will pay.

## 2012-12-28 NOTE — Progress Notes (Signed)
Primary Cardiologist: Dr. Clifton James PCP: Dr. Shary Decamp Oncologist: Dr. Amie Critchley at [facility] EP: Dr. Johney Frame  Dr. Shary Decamp in [place] Weight Range   220-224 pounds  Baseline proBNP     HPI:  [name] is a 62 y.o. female a cardiac history that includes hypertension, Type B aortic dissection (2010) managed with medical therapy by Dr. Donata Clay and has healed, LBBB, systolic heart failure due to NICM with EF 10-15% in May 2013 which has decreased 65% in 2010.  Cath Dec 2011 showed normal arteries.  She was diagnosed with breast cancer in 2009 s/p lumpectomy, XRT, and chemotherapy.  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   She underwent St Jude Bi-V ICD implantation Sept 2012 but unable to place LV lead.  She eventually brought back in Nov 2012 for left thoracotomy and placement of epicardial LV lead placement by Dr. Donata Clay.  She also had a sleep study in Jan that showed mild OSA/hypopnea syndrome.  AHI of 6 events per hour with O2 desat as low as 85%.  No clinical intervention occurred.    10/10/2012 Peak VO2: 10.6 % predicted peak VO2: 64.4% VE/VCO2 slope: 26.9 OUES: 1.18 Peak RER: 1.29 Ventilatory Threshold: 7.3 % predicted peak VO2: 44.4% Peak RR 23 Peak Ventilation: 29.8 VE/MVV: 32.6% PETCO2 at peak: 43  She returns for follow up with her sister and mother.  She feels well.  She says she feels much better since stopping digoxin.  Her weight has been stable 222-224 pounds.  She has not required extra lasix.  She has dyspnea with exertion that is worse when it is cold outside.  She is still having some heat flashes with niacin but much improved with half the dose.  She would like to start exercising again, preferably through cardiac rehab.       ROS: All systems negative except as listed in HPI, PMH and Problem List.  Past Medical History  Diagnosis Date  . Nonischemic cardiomyopathy     Admission 12/11 with EF  25%, normal coronary arteries by cath 12/11; NICM presumed to be secondary to chemotherapy for breast cancer  . DVT (deep venous thrombosis) 10/18/2010  . Obesity   . 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 Glaucoma(365)   . Depression   . Breast cancer 07/2009    Stage 3 lumpectomy, radiation, chemotherapy; finished chemo October, felt to be in remission  . History of shingles   . Hyperlipidemia   . Chronic systolic dysfunction of left ventricle   . Left bundle branch block   . Cardiac defibrillator in place     St. Jude (JA) 9/12; LV lead placement unsuccessful;  s/p left thoracotomy with placement of epicardial LV lead 09/2011 (Dr. PVT)  . [name]     with exertion  . Hypertension   . CHF (congestive heart failure)   . GERD (gastroesophageal reflux disease)   . Arthritis     lower back  . Hiatal hernia     small hiatal hernia  . Atrial fibrillation     post op (left thoracotomy) 11/12 req amio/DCCV    Current Outpatient Prescriptions  Medication Sig Dispense Refill  . albuterol (PROVENTIL HFA;VENTOLIN HFA) 108 (90 BASE) MCG/ACT inhaler Inhale 2 puffs into the lungs every 4 (four) hours as needed. [name]       . albuterol (PROVENTIL) (2.5 MG/3ML) 0.083% nebulizer solution Take 2.5 mg by nebulization  as needed. [name]       . ALPRAZolam (XANAX) 1 MG tablet Take 1 mg by mouth as needed. Anxiety       . aspirin 81 MG EC tablet Take 81 mg by mouth daily.        . Bepotastine Besilate (BEPREVE) 1.5 % SOLN Place 1 drop into both eyes as needed. Dry eyes       . bimatoprost (LUMIGAN) 0.03 % ophthalmic solution Place 1 drop into both eyes at bedtime.        . Calcium Carbonate-Vitamin D (CALCIUM-VITAMIN D3) 600-200 MG-UNIT TABS Take 1 tablet by mouth 2 (two) times daily.        . carvedilol (COREG) 6.25 MG tablet Take 1 tablet (6.25 mg total) by mouth 2 (two) times daily with a meal.  180 tablet  3  . cholecalciferol (VITAMIN D) 1000 UNITS  tablet Take 1,000 Units by mouth 2 (two) times daily.        . fexofenadine (ALLEGRA) 180 MG tablet Take 180 mg by mouth as needed. Allergies       . fluticasone (FLONASE) 50 MCG/ACT nasal spray Place 2 sprays into the nose as needed. Allergies       . furosemide (LASIX) 40 MG tablet Take one tablet every AM and half tablet every PM  135 tablet  3  . letrozole (FEMARA) 2.5 MG tablet Take 2.5 mg by mouth every evening.        . loratadine (CLARITIN) 10 MG tablet Take 10 mg by mouth daily as needed. For allergies       . montelukast (SINGULAIR) 10 MG tablet Take 10 mg by mouth as needed. For allergies & congestion      . Multiple Vitamin (MULTIVITAMIN) tablet Take 1 tablet by mouth daily.        . niacin (NIASPAN) 1000 MG CR tablet Take 500 mg by mouth at bedtime.       [name] 20 MG capsule Take 20 mg by mouth 2 (two) times daily.        [name] oxyCODONE-acetaminophen (PERCOCET) 5-325 MG per tablet Take 1 tablet by mouth every 4 (four) hours as needed.      [name] PARoxetine (PAXIL) 10 MG tablet Take 20 mg by mouth every morning.       [name] (SYSTANE OP) Apply to eye as needed.      . potassium chloride SA (K-DUR,KLOR-CON) 20 MEQ tablet Take 40 mEq by mouth 2 (two) times daily.      [name] senna-docusate (SENOKOT-S) 8.6-50 MG per tablet Take 1 tablet by mouth daily.        . simvastatin (ZOCOR) 20 MG tablet Take 20 mg by mouth at bedtime.        . valsartan (DIOVAN) 40 MG tablet Take 1 tablet (40 mg total) by mouth 2 (two) times daily.  60 tablet  3  . [DISCONTINUED] ranitidine (ZANTAC) 300 MG capsule Take 300 mg by mouth daily as needed. Acid indigestion       No current facility-administered medications for this encounter.     PHYSICAL EXAM: Filed Vitals:   12/28/12 1003  BP: 106/74  Pulse: 84  Weight: 230 lb 4 oz (104.441 kg)  SpO2: 97%    General:  Well appearing. No resp difficulty Daughter and Mother present HEENT: normal Neck: supple. JVP  difficult to assess. Carotids 2+ bilaterally; no bruits. No lymphadenopathy or thryomegaly appreciated. Cor: PMI normal. Regular rate &  rhythm. No rubs, gallops or murmurs. Lungs: clear Abdomen: obese, soft, nontender, nondistended. No hepatosplenomegaly. No bruits or masses. Good bowel sounds. Extremities: no cyanosis, clubbing, rash, tr edema Neuro: alert & orientedx3, cranial nerves grossly intact. Moves all 4 extremities w/o difficulty. Affect pleasant.      ASSESSMENT & PLAN:

## 2012-12-28 NOTE — Patient Instructions (Addendum)
Increase carvedilol 1.5 tabs at night the next 4 days and if feeling ok then increase morning dose carvedilol 1.5 tabs in the morning.   Follow up 6 weeks with Dr. Gala Romney

## 2012-12-28 NOTE — Assessment & Plan Note (Signed)
Followed by Dr. Johney Frame, will have optimization echo next week.

## 2013-01-02 ENCOUNTER — Ambulatory Visit (HOSPITAL_COMMUNITY): Payer: PRIVATE HEALTH INSURANCE | Attending: Internal Medicine | Admitting: Radiology

## 2013-01-02 ENCOUNTER — Telehealth: Payer: Self-pay | Admitting: Internal Medicine

## 2013-01-02 ENCOUNTER — Encounter: Payer: Self-pay | Admitting: Internal Medicine

## 2013-01-02 DIAGNOSIS — I5022 Chronic systolic (congestive) heart failure: Secondary | ICD-10-CM | POA: Insufficient documentation

## 2013-01-02 DIAGNOSIS — I509 Heart failure, unspecified: Secondary | ICD-10-CM

## 2013-01-02 NOTE — Telephone Encounter (Signed)
Pt would like echo results to be sent to pcp dr Tammy Sours Shary Decamp

## 2013-01-02 NOTE — Progress Notes (Signed)
Echocardiogram performed with AV Optimization.  

## 2013-01-03 NOTE — Telephone Encounter (Signed)
Echo was forwarded to Dr Shary Decamp

## 2013-02-01 ENCOUNTER — Ambulatory Visit (HOSPITAL_COMMUNITY)
Admission: RE | Admit: 2013-02-01 | Discharge: 2013-02-01 | Disposition: A | Discharge status: Home / Self Care | Payer: PRIVATE HEALTH INSURANCE | Source: Ambulatory Visit | Attending: Internal Medicine | Admitting: Internal Medicine

## 2013-02-01 VITALS — BP 102/60 | HR 70 | Wt 231.0 lb

## 2013-02-01 DIAGNOSIS — I5022 Chronic systolic (congestive) heart failure: Secondary | ICD-10-CM

## 2013-02-01 MED ORDER — CARVEDILOL 12.5 MG PO TABS: 12.5000 mg | ORAL_TABLET | Freq: Two times a day (BID) | ORAL | Status: DC

## 2013-02-01 NOTE — Progress Notes (Signed)
Patient ID: [name], female   DOB: 04/28/1951, 62 y.o.   MRN: [id number] Primary Cardiologist: Dr. Clifton James PCP: Dr. Shary Decamp Oncologist: Dr. Amie Critchley at [facility] EP: Dr. Johney Frame  Dr. Shary Decamp in [place] Weight [place]   220-224 pounds  Baseline proBNP     HPI:  [name] is a 62 y.o. female a cardiac history that includes hypertension, Type B aortic dissection (2010) managed with medical therapy by Dr. Donata Clay and has healed, LBBB, systolic heart failure due to NICM with EF 10-15% in May 2013 which has decreased 65% in 2010.  Cath Dec 2011 showed normal arteries.  She was diagnosed with breast cancer in 2009 s/p lumpectomy, XRT, and chemotherapy.  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   She underwent St Jude Bi-V ICD implantation Sept 2012 but unable to place LV lead.  She eventually brought back in Nov 2012 for left thoracotomy and placement of epicardial LV lead placement by Dr. Donata Clay.  She also had a sleep study in Jan that showed mild OSA/hypopnea syndrome.  AHI of 6 events per hour with O2 desat as low as 85%.  No clinical intervention occurred.    10/10/2012 Peak VO2: 10.6 % predicted peak VO2: 64.4% VE/VCO2 slope: 26.9 OUES: 1.18 Peak RER: 1.29 Ventilatory Threshold: 7.3 % predicted peak VO2: 44.4% Peak RR 23 Peak Ventilation: 29.8 VE/MVV: 32.6% PETCO2 at peak: 43  ECHO 01/02/13 EF 15% Grade 1 diastolic dysfunction. RV ok. No PAH  She returns for follow up with her mother.  Last visit carvedilol increased to 9.375 mg twice a day. Denies SOB/PND/Orthopnea. Able to walk up inclined surfaces. Weight at home 223-224 pounds.  Weighing every day and using sliding-scale lasix as needed. Participating in cardiac rehab 3 days a week.  Had AV optimization recently with Dr. Johney Frame    ROS: All systems negative except as listed in HPI, PMH and Problem List.  Past Medical History  Diagnosis Date  .  Nonischemic cardiomyopathy     Admission 12/11 with EF 25%, normal coronary arteries by cath 12/11; NICM presumed to be secondary to chemotherapy for breast cancer  . DVT (deep venous thrombosis) 10/18/2010  . Obesity   . 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 Glaucoma(365)   . Depression   . Breast cancer 07/2009    Stage 3 lumpectomy, radiation, chemotherapy; finished chemo October, felt to be in remission  . History of shingles   . Hyperlipidemia   . Chronic systolic dysfunction of left ventricle   . Left bundle branch block   . Cardiac defibrillator in place     St. Jude (JA) 9/12; LV lead placement unsuccessful;  s/p left thoracotomy with placement of epicardial LV lead 09/2011 (Dr. PVT)  . [name]     with exertion  . Hypertension   . CHF (congestive heart failure)   . GERD (gastroesophageal reflux disease)   . Arthritis     lower back  . Hiatal hernia     small hiatal hernia  . Atrial fibrillation     post op (left thoracotomy) 11/12 req amio/DCCV    Current Outpatient Prescriptions  Medication Sig Dispense Refill  . albuterol (PROVENTIL HFA;VENTOLIN HFA) 108 (90 BASE) MCG/ACT inhaler Inhale 2 puffs into the lungs every 4 (four) hours as needed. [name]       . albuterol (PROVENTIL) (2.5 MG/3ML) 0.083% nebulizer solution Take 2.5 mg  by nebulization as needed. [name]       . ALPRAZolam (XANAX) 1 MG tablet Take 1 mg by mouth as needed. Anxiety       . aspirin 81 MG EC tablet Take 81 mg by mouth daily.        . Bepotastine Besilate (BEPREVE) 1.5 % SOLN Place 1 drop into both eyes as needed. Dry eyes       . bimatoprost (LUMIGAN) 0.03 % ophthalmic solution Place 1 drop into both eyes at bedtime.        . Calcium Carbonate-Vitamin D (CALCIUM-VITAMIN D3) 600-200 MG-UNIT TABS Take 1 tablet by mouth 2 (two) times daily.        . carvedilol (COREG) 6.25 MG tablet Take 1.5 tablets (9.375 mg total) by mouth 2 (two) times daily with a meal.   180 tablet  3  . cholecalciferol (VITAMIN D) 1000 UNITS tablet Take 1,000 Units by mouth 2 (two) times daily.        . fexofenadine (ALLEGRA) 180 MG tablet Take 180 mg by mouth as needed. Allergies       . fluticasone (FLONASE) 50 MCG/ACT nasal spray Place 2 sprays into the nose as needed. Allergies       . furosemide (LASIX) 40 MG tablet Take one tablet every AM and half tablet every PM  135 tablet  3  . letrozole (FEMARA) 2.5 MG tablet Take 2.5 mg by mouth every evening.        . loratadine (CLARITIN) 10 MG tablet Take 10 mg by mouth daily as needed. For allergies       . montelukast (SINGULAIR) 10 MG tablet Take 10 mg by mouth as needed. For allergies & congestion      . Multiple Vitamin (MULTIVITAMIN) tablet Take 1 tablet by mouth daily.        . niacin (NIASPAN) 1000 MG CR tablet Take 500 mg by mouth at bedtime.       [name] 20 MG capsule Take 20 mg by mouth 2 (two) times daily.        [name] oxyCODONE-acetaminophen (PERCOCET) 5-325 MG per tablet Take 1 tablet by mouth every 4 (four) hours as needed.      [name] PARoxetine (PAXIL) 10 MG tablet Take 20 mg by mouth every morning.       [name] (SYSTANE OP) Apply to eye as needed.      . potassium chloride SA (K-DUR,KLOR-CON) 20 MEQ tablet Take 40 mEq by mouth 2 (two) times daily.      [name] senna-docusate (SENOKOT-S) 8.6-50 MG per tablet Take 1 tablet by mouth daily.        . simvastatin (ZOCOR) 20 MG tablet Take 20 mg by mouth at bedtime.        . valsartan (DIOVAN) 40 MG tablet Take 1 tablet (40 mg total) by mouth 2 (two) times daily.  60 tablet  3  . [DISCONTINUED] ranitidine (ZANTAC) 300 MG capsule Take 300 mg by mouth daily as needed. Acid indigestion       No current facility-administered medications for this encounter.     PHYSICAL EXAM: Filed Vitals:   02/01/13 1012  BP: 102/60  Pulse: 70  Weight: 231 lb (104.781 kg)  SpO2: 98%    General:  Well appearing. No resp difficulty Daughter and  Mother present HEENT: normal Neck: supple. JVP difficult to assess. Carotids 2+ bilaterally; no bruits. No lymphadenopathy or thryomegaly appreciated. Cor: PMI normal. Regular rate &  rhythm. No rubs, gallops or murmurs. Lungs: clear Abdomen: obese, soft, nontender, nondistended. No hepatosplenomegaly. No bruits or masses. Good bowel sounds. Extremities: no cyanosis, clubbing, rash, tr edema Neuro: alert & orientedx3, cranial nerves grossly intact. Moves all 4 extremities w/o difficulty. Affect pleasant.      ASSESSMENT & PLAN:

## 2013-02-01 NOTE — Patient Instructions (Addendum)
Increase carvedilol to 12.5 mg Twice daily   Your physician recommends that you schedule a follow-up appointment in: 4 weeks

## 2013-02-01 NOTE — Assessment & Plan Note (Addendum)
Patient seen and examined with Tonye Becket, NP. We discussed all aspects of the encounter. I agree with the assessment and plan as stated above. My thoughts below:  Doing well. Symptomatically improved despite persistent severe LV dysfunction. NYHA II-III. Volume status looks good. Increase carvedilol to 12.5 bid. Reinforced need for daily weights and reviewed use of sliding scale diuretics. Will attempt to add back low dose ACE-I at next visit. Will try enalapril 2.5 bid. Had to stop digoxin previously due to digoxin toxicity.

## 2013-02-12 ENCOUNTER — Telehealth (HOSPITAL_COMMUNITY): Payer: Self-pay | Admitting: Cardiology

## 2013-02-12 NOTE — Telephone Encounter (Signed)
Will send to Dr Bensimhon  

## 2013-02-12 NOTE — Telephone Encounter (Signed)
During pts call to reschedule upcoming appt.  Pt wanted to ask Dr.Bensimhon if it was all right to receive injection of Xgeva for osteopenia treatment? Pt is scheduled to receive injection at [facility] 03/01/13  Please advise

## 2013-02-12 NOTE — Telephone Encounter (Signed)
Pt aware.

## 2013-02-12 NOTE — Telephone Encounter (Signed)
ok 

## 2013-02-22 ENCOUNTER — Other Ambulatory Visit (HOSPITAL_COMMUNITY): Payer: Self-pay | Admitting: Internal Medicine

## 2013-03-01 ENCOUNTER — Encounter (HOSPITAL_COMMUNITY): Payer: PRIVATE HEALTH INSURANCE

## 2013-03-08 ENCOUNTER — Ambulatory Visit (HOSPITAL_COMMUNITY)
Admission: RE | Admit: 2013-03-08 | Discharge: 2013-03-08 | Disposition: A | Discharge status: Home / Self Care | Payer: PRIVATE HEALTH INSURANCE | Source: Ambulatory Visit | Attending: Internal Medicine | Admitting: Internal Medicine

## 2013-03-08 VITALS — BP 110/64 | HR 77 | Wt 233.5 lb

## 2013-03-08 DIAGNOSIS — K219 Gastro-esophageal reflux disease without esophagitis: Secondary | ICD-10-CM | POA: Insufficient documentation

## 2013-03-08 DIAGNOSIS — I10 Essential (primary) hypertension: Secondary | ICD-10-CM | POA: Insufficient documentation

## 2013-03-08 DIAGNOSIS — I5020 Unspecified systolic (congestive) heart failure: Secondary | ICD-10-CM | POA: Insufficient documentation

## 2013-03-08 DIAGNOSIS — E785 Hyperlipidemia, unspecified: Secondary | ICD-10-CM | POA: Insufficient documentation

## 2013-03-08 DIAGNOSIS — I447 Left bundle-branch block, unspecified: Secondary | ICD-10-CM | POA: Insufficient documentation

## 2013-03-08 DIAGNOSIS — Z79899 Other long term (current) drug therapy: Secondary | ICD-10-CM | POA: Insufficient documentation

## 2013-03-08 DIAGNOSIS — F3289 Other specified depressive episodes: Secondary | ICD-10-CM | POA: Insufficient documentation

## 2013-03-08 DIAGNOSIS — K449 Diaphragmatic hernia without obstruction or gangrene: Secondary | ICD-10-CM | POA: Insufficient documentation

## 2013-03-08 DIAGNOSIS — Z853 Personal history of malignant neoplasm of breast: Secondary | ICD-10-CM | POA: Insufficient documentation

## 2013-03-08 DIAGNOSIS — I429 Cardiomyopathy, unspecified: Secondary | ICD-10-CM | POA: Insufficient documentation

## 2013-03-08 DIAGNOSIS — I5022 Chronic systolic (congestive) heart failure: Secondary | ICD-10-CM

## 2013-03-08 DIAGNOSIS — M129 Arthropathy, unspecified: Secondary | ICD-10-CM | POA: Insufficient documentation

## 2013-03-08 DIAGNOSIS — H409 Unspecified glaucoma: Secondary | ICD-10-CM | POA: Insufficient documentation

## 2013-03-08 DIAGNOSIS — I4891 Unspecified atrial fibrillation: Secondary | ICD-10-CM | POA: Insufficient documentation

## 2013-03-08 DIAGNOSIS — Z95810 Presence of automatic (implantable) cardiac defibrillator: Secondary | ICD-10-CM | POA: Insufficient documentation

## 2013-03-08 DIAGNOSIS — F329 Major depressive disorder, single episode, unspecified: Secondary | ICD-10-CM | POA: Insufficient documentation

## 2013-03-08 MED ORDER — SPIRONOLACTONE 25 MG PO TABS: 12.5000 mg | ORAL_TABLET | Freq: Every day | ORAL | Status: DC

## 2013-03-08 MED ORDER — POTASSIUM CHLORIDE CRYS ER 20 MEQ PO TBCR: 40.0000 meq | EXTENDED_RELEASE_TABLET | Freq: Every day | ORAL | Status: DC

## 2013-03-08 NOTE — Progress Notes (Signed)
Patient ID: [name], female   DOB: 06/10/1951, 62 y.o.   MRN: [id number] Primary Cardiologist: Dr. Clifton James PCP: Dr. Shary Decamp Oncologist: Dr. Amie Critchley at [facility] EP: Dr. Johney Frame  Dr. Shary Decamp in [place] Weight [place]   220-224 pounds  Baseline proBNP     HPI:  [name] is a 62 y.o. female a cardiac history that includes hypertension, Type B aortic dissection (2010) managed with medical therapy by Dr. Donata Clay and has healed, LBBB, systolic heart failure due to NICM with EF 10-15% in May 2013 which has decreased 65% in 2010.  Cath Dec 2011 showed normal arteries.  She was diagnosed with breast cancer in 2009 s/p lumpectomy, XRT, and chemotherapy.  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   She underwent St Jude Bi-V ICD implantation Sept 2012 but unable to place LV lead.  She eventually brought back in Nov 2012 for left thoracotomy and placement of epicardial LV lead placement by Dr. Donata Clay.  She also had a sleep study in Jan that showed mild OSA/hypopnea syndrome.  AHI of 6 events per hour with O2 desat as low as 85%.  No clinical intervention occurred.    10/10/2012 Peak VO2: 10.6 % predicted peak VO2: 64.4% VE/VCO2 slope: 26.9 OUES: 1.18 Peak RER: 1.29 Ventilatory Threshold: 7.3 % predicted peak VO2: 44.4% Peak RR 23 Peak Ventilation: 29.8 VE/MVV: 32.6% PETCO2 at peak: 43  ECHO 01/02/13 EF 15% Grade 1 diastolic dysfunction. RV ok. No PAH  She returns for follow up today with her husband.  She feels well.  Last visit carvedilol increased 12.5 mg BID.  Had presyncope with BP 84/50 twice but this has resolved over the last 2 weeks.  She continues to participate in cardiac rehab 2x week and usually does really well.  Denies dyspnea, orthopnea or PND.  Some bendopnea.  Says when she travels she builds up extra fluid but usually resolves with extra fluid pill.    Her husband says her activity is increasing and he  feels could with his progress.      ROS: All systems negative except as listed in HPI, PMH and Problem List.  Past Medical History  Diagnosis Date  . Nonischemic cardiomyopathy     Admission 12/11 with EF 25%, normal coronary arteries by cath 12/11; NICM presumed to be secondary to chemotherapy for breast cancer  . DVT (deep venous thrombosis) 10/18/2010  . Obesity   . 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 Glaucoma(365)   . Depression   . Breast cancer 07/2009    Stage 3 lumpectomy, radiation, chemotherapy; finished chemo October, felt to be in remission  . History of shingles   . Hyperlipidemia   . Chronic systolic dysfunction of left ventricle   . Left bundle branch block   . Cardiac defibrillator in place     St. Jude (JA) 9/12; LV lead placement unsuccessful;  s/p left thoracotomy with placement of epicardial LV lead 09/2011 (Dr. PVT)  . [name]     with exertion  . Hypertension   . CHF (congestive heart failure)   . GERD (gastroesophageal reflux disease)   . Arthritis     lower back  . Hiatal hernia     small hiatal hernia  . Atrial fibrillation     post op (left thoracotomy) 11/12 req amio/DCCV    Current Outpatient Prescriptions  Medication Sig Dispense Refill  . albuterol (PROVENTIL  HFA;VENTOLIN HFA) 108 (90 BASE) MCG/ACT inhaler Inhale 2 puffs into the lungs every 4 (four) hours as needed. [name]       . albuterol (PROVENTIL) (2.5 MG/3ML) 0.083% nebulizer solution Take 2.5 mg by nebulization as needed. [name]       . ALPRAZolam (XANAX) 1 MG tablet Take 1 mg by mouth as needed. Anxiety       . aspirin 81 MG EC tablet Take 81 mg by mouth daily.        . Bepotastine Besilate (BEPREVE) 1.5 % SOLN Place 1 drop into both eyes as needed. Dry eyes       . bimatoprost (LUMIGAN) 0.03 % ophthalmic solution Place 1 drop into both eyes at bedtime.        . Calcium Carbonate-Vitamin D (CALCIUM-VITAMIN D3) 600-200 MG-UNIT TABS Take 1  tablet by mouth 2 (two) times daily.        . carvedilol (COREG) 12.5 MG tablet Take 1 tablet (12.5 mg total) by mouth 2 (two) times daily with a meal.  60 tablet  3  . cholecalciferol (VITAMIN D) 1000 UNITS tablet Take 1,000 Units by mouth 2 (two) times daily.        [name] DIOVAN 40 MG tablet TAKE ONE TABLET TWICE DAILY  60 tablet  6  . fexofenadine (ALLEGRA) 180 MG tablet Take 180 mg by mouth as needed. Allergies       . fluticasone (FLONASE) 50 MCG/ACT nasal spray Place 2 sprays into the nose as needed. Allergies       . furosemide (LASIX) 40 MG tablet Take 40 mg by mouth 2 (two) times daily.      [name] letrozole (FEMARA) 2.5 MG tablet Take 2.5 mg by mouth every evening.        . loratadine (CLARITIN) 10 MG tablet Take 10 mg by mouth daily as needed. For allergies       . montelukast (SINGULAIR) 10 MG tablet Take 10 mg by mouth as needed. For allergies & congestion      . Multiple Vitamin (MULTIVITAMIN) tablet Take 1 tablet by mouth daily.        . niacin (NIASPAN) 1000 MG CR tablet Take 500 mg by mouth at bedtime.       [name] 20 MG capsule Take 20 mg by mouth 2 (two) times daily.        [name] oxyCODONE-acetaminophen (PERCOCET) 5-325 MG per tablet Take 1 tablet by mouth every 4 (four) hours as needed.      [name] PARoxetine (PAXIL) 10 MG tablet Take 20 mg by mouth every morning.       [name] (SYSTANE OP) Apply to eye as needed.      . potassium chloride SA (K-DUR,KLOR-CON) 20 MEQ tablet Take 40 mEq by mouth 2 (two) times daily.      [name] senna-docusate (SENOKOT-S) 8.6-50 MG per tablet Take 1 tablet by mouth daily.        . simvastatin (ZOCOR) 20 MG tablet Take 20 mg by mouth at bedtime.        . traMADol (ULTRAM) 50 MG tablet Take 50 mg by mouth every 6 (six) hours as needed for pain.      . [DISCONTINUED] ranitidine (ZANTAC) 300 MG capsule Take 300 mg by mouth daily as needed. Acid indigestion       No current facility-administered medications for this encounter.      PHYSICAL EXAM: Filed Vitals:   03/08/13  1009  BP: 110/64  Pulse: 77  Weight: 233 lb 8 oz (105.915 kg)  SpO2: 98%    General:  Well appearing. No resp difficulty Daughter and Mother present HEENT: normal Neck: supple. JVP difficult to assess due to body habitus. Carotids 2+ bilaterally; no bruits. No lymphadenopathy or thryomegaly appreciated. Cor: PMI normal. Regular rate & rhythm. No rubs, gallops or murmurs. Lungs: clear Abdomen: obese, soft, nontender, nondistended. No hepatosplenomegaly. No bruits or masses. Good bowel sounds. Extremities: no cyanosis, clubbing, rash, tr edema Neuro: alert & orientedx3, cranial nerves grossly intact. Moves all 4 extremities w/o difficulty. Affect pleasant.      ASSESSMENT & PLAN:

## 2013-03-08 NOTE — Assessment & Plan Note (Addendum)
NYHA II.  Doing well overall but still with some bad days.  With mild edema will add spiro 12.5 mg daily.  She will have repeat BMET end of next week.  Otherwise continue carvedilol and valsartan.  No digoxin due to previous toxicity.  Continue with cardiac rehab.

## 2013-03-08 NOTE — Assessment & Plan Note (Signed)
Well controlled on current regimen.  Watch BP with addition of spironolactone.

## 2013-03-08 NOTE — Patient Instructions (Addendum)
Add spironolactone 12.5 mg daily  Cut back potassium to once daily  Get lab work in the next 7-10 days  Follow up 1 month

## 2013-03-11 ENCOUNTER — Encounter: Payer: Self-pay | Admitting: Internal Medicine

## 2013-03-11 ENCOUNTER — Ambulatory Visit (INDEPENDENT_AMBULATORY_CARE_PROVIDER_SITE_OTHER): Payer: PRIVATE HEALTH INSURANCE | Admitting: *Deleted

## 2013-03-11 ENCOUNTER — Other Ambulatory Visit: Payer: Self-pay | Admitting: Internal Medicine

## 2013-03-11 DIAGNOSIS — I5022 Chronic systolic (congestive) heart failure: Secondary | ICD-10-CM

## 2013-03-11 DIAGNOSIS — I428 Other cardiomyopathies: Secondary | ICD-10-CM

## 2013-03-11 DIAGNOSIS — Z95810 Presence of automatic (implantable) cardiac defibrillator: Secondary | ICD-10-CM

## 2013-03-11 LAB — REMOTE ICD DEVICE
ATRIAL PACING ICD: 81 pct
BAMS-0001: 180 {beats}/min
DEV-0020ICD: NEGATIVE
RV LEAD IMPEDENCE ICD: 380 Ohm
VENTRICULAR PACING ICD: 100 pct

## 2013-03-20 ENCOUNTER — Encounter: Payer: Self-pay | Admitting: *Deleted

## 2013-04-03 ENCOUNTER — Other Ambulatory Visit: Payer: Self-pay | Admitting: Physician Assistant

## 2013-04-12 ENCOUNTER — Encounter (HOSPITAL_COMMUNITY): Payer: Self-pay

## 2013-04-12 ENCOUNTER — Ambulatory Visit (HOSPITAL_COMMUNITY)
Admission: RE | Admit: 2013-04-12 | Discharge: 2013-04-12 | Disposition: A | Discharge status: Home / Self Care | Payer: PRIVATE HEALTH INSURANCE | Source: Ambulatory Visit | Attending: Internal Medicine | Admitting: Internal Medicine

## 2013-04-12 VITALS — BP 126/74 | HR 77 | Ht 62.0 in | Wt 237.4 lb

## 2013-04-12 DIAGNOSIS — E785 Hyperlipidemia, unspecified: Secondary | ICD-10-CM | POA: Insufficient documentation

## 2013-04-12 DIAGNOSIS — I5022 Chronic systolic (congestive) heart failure: Secondary | ICD-10-CM

## 2013-04-12 DIAGNOSIS — I447 Left bundle-branch block, unspecified: Secondary | ICD-10-CM | POA: Insufficient documentation

## 2013-04-12 DIAGNOSIS — I10 Essential (primary) hypertension: Secondary | ICD-10-CM | POA: Insufficient documentation

## 2013-04-12 DIAGNOSIS — I509 Heart failure, unspecified: Secondary | ICD-10-CM | POA: Insufficient documentation

## 2013-04-12 DIAGNOSIS — I5020 Unspecified systolic (congestive) heart failure: Secondary | ICD-10-CM | POA: Insufficient documentation

## 2013-04-12 DIAGNOSIS — Z95810 Presence of automatic (implantable) cardiac defibrillator: Secondary | ICD-10-CM | POA: Insufficient documentation

## 2013-04-12 DIAGNOSIS — Z853 Personal history of malignant neoplasm of breast: Secondary | ICD-10-CM | POA: Insufficient documentation

## 2013-04-12 MED ORDER — CARVEDILOL 12.5 MG PO TABS: ORAL_TABLET | ORAL | Status: DC

## 2013-04-12 NOTE — Patient Instructions (Addendum)
Take carvedilol 12.5 mg in am and 18.75 mg in pm  Do the following things EVERYDAY: 1) Weigh yourself in the morning before breakfast. Write it down and keep it in a log. 2) Take your medicines as prescribed 3) Eat low salt foods-Limit salt (sodium) to 2000 mg per day.  4) Stay as active as you can everyday 5) Limit all fluids for the day to less than 2 liters  Follow up in 1 month

## 2013-04-12 NOTE — Assessment & Plan Note (Signed)
Volume status stable. Continue current diuretic regimen. Reviewed most recent BMET from 03/18/13-Stable. Continue 12.5 mg carvedilol in am and increase night time carvedilol to 18.75 mg in pm. Instructed to take an additional lasix for 3 pound weight gain in 24 hours. Reinforced limiting fluid intake to < 2 liters and low salt food choices. Follow up in 4 weeks.

## 2013-04-12 NOTE — Progress Notes (Signed)
Patient ID: [name], female   DOB: 02-06-1951, 62 y.o.   MRN: [id number] Primary Cardiologist: Dr. Clifton James PCP: Dr. Shary Decamp Oncologist: Dr. Amie Critchley at [facility] EP: Dr. Johney Frame  Dr. Shary Decamp in [place] Weight Range   220-224 pounds  Baseline proBNP     HPI:  [name] is a 62 y.o. female a cardiac history that includes hypertension, Type B aortic dissection (2010) managed with medical therapy by Dr. Donata Clay and has healed, LBBB, systolic heart failure due to NICM with EF 10-15% in May 2013 which has decreased 65% in 2010.  Cath Dec 2011 showed normal arteries.  She was diagnosed with breast cancer in 2009 s/p lumpectomy, XRT, and chemotherapy.  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   She underwent St Jude Bi-V ICD implantation Sept 2012 but unable to place LV lead.  She eventually brought back in Nov 2012 for left thoracotomy and placement of epicardial LV lead placement by Dr. Donata Clay.  She also had a sleep study in Jan that showed mild OSA/hypopnea syndrome.  AHI of 6 events per hour with O2 desat as low as 85%.  No clinical intervention occurred.    10/10/2012 Peak VO2: 10.6 % predicted peak VO2: 64.4% VE/VCO2 slope: 26.9 OUES: 1.18 Peak RER: 1.29 Ventilatory Threshold: 7.3 % predicted peak VO2: 44.4% Peak RR 23 Peak Ventilation: 29.8 VE/MVV: 32.6% PETCO2 at peak: 43  ECHO 01/02/13 EF 15% Grade 1 diastolic dysfunction. RV ok. No PAH  She returns for follow up today with daughter and mom. Last visit spironolactone 12.5 mg added. Overall she feels better and dyspnea is improving.  Denies SOB/PND/Orthopnea. She admits that she paces herself with activities. Weight at home 222-230 pounds. Drinking < than 2 liters per day. Tries to follow low salt diet. She continues with cardiac rehab at [facility].    03/18/13 Potassium 4.1 Creatinine 1.0  ROS: All systems negative except as listed in HPI, PMH and  Problem List.  Past Medical History  Diagnosis Date  . Nonischemic cardiomyopathy     Admission 12/11 with EF 25%, normal coronary arteries by cath 12/11; NICM presumed to be secondary to chemotherapy for breast cancer  . DVT (deep venous thrombosis) 10/18/2010  . Obesity   . 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 Glaucoma   . Depression   . Breast cancer 07/2009    Stage 3 lumpectomy, radiation, chemotherapy; finished chemo October, felt to be in remission  . History of shingles   . Hyperlipidemia   . Chronic systolic dysfunction of left ventricle   . Left bundle branch block   . Cardiac defibrillator in place     St. Jude (JA) 9/12; LV lead placement unsuccessful;  s/p left thoracotomy with placement of epicardial LV lead 09/2011 (Dr. PVT)  . [name]     with exertion  . Hypertension   . CHF (congestive heart failure)   . GERD (gastroesophageal reflux disease)   . Arthritis     lower back  . Hiatal hernia     small hiatal hernia  . Atrial fibrillation     post op (left thoracotomy) 11/12 req amio/DCCV    Current Outpatient Prescriptions  Medication Sig Dispense Refill  . albuterol (PROVENTIL HFA;VENTOLIN HFA) 108 (90 BASE) MCG/ACT inhaler Inhale 2 puffs into the lungs every 4 (four) hours as needed. [name]       . albuterol (  PROVENTIL) (2.5 MG/3ML) 0.083% nebulizer solution Take 2.5 mg by nebulization as needed. [name]       . ALPRAZolam (XANAX) 1 MG tablet Take 1 mg by mouth as needed. Anxiety       . aspirin 81 MG EC tablet Take 81 mg by mouth daily.        . Bepotastine Besilate (BEPREVE) 1.5 % SOLN Place 1 drop into both eyes as needed. Dry eyes       . bimatoprost (LUMIGAN) 0.03 % ophthalmic solution Place 1 drop into both eyes at bedtime.        . Calcium Carbonate-Vitamin D (CALCIUM-VITAMIN D3) 600-200 MG-UNIT TABS Take 1 tablet by mouth 2 (two) times daily.        . carvedilol (COREG) 12.5 MG tablet Take 1 tablet (12.5 mg  total) by mouth 2 (two) times daily with a meal.  60 tablet  3  . cholecalciferol (VITAMIN D) 1000 UNITS tablet Take 1,000 Units by mouth 2 (two) times daily.        [name] DIOVAN 40 MG tablet TAKE ONE TABLET TWICE DAILY  60 tablet  6  . fexofenadine (ALLEGRA) 180 MG tablet Take 180 mg by mouth as needed. Allergies       . fluticasone (FLONASE) 50 MCG/ACT nasal spray Place 2 sprays into the nose as needed. Allergies       . furosemide (LASIX) 40 MG tablet Take 1 tablet (40 mg total) by mouth 2 (two) times daily.  180 tablet  5  . letrozole (FEMARA) 2.5 MG tablet Take 2.5 mg by mouth every evening.        . loratadine (CLARITIN) 10 MG tablet Take 10 mg by mouth daily as needed. For allergies       . montelukast (SINGULAIR) 10 MG tablet Take 10 mg by mouth as needed. For allergies & congestion      . Multiple Vitamin (MULTIVITAMIN) tablet Take 1 tablet by mouth daily.        . niacin (NIASPAN) 1000 MG CR tablet Take 500 mg by mouth at bedtime.       [name] 20 MG capsule Take 20 mg by mouth 2 (two) times daily.        [name] oxyCODONE-acetaminophen (PERCOCET) 5-325 MG per tablet Take 1 tablet by mouth every 4 (four) hours as needed.      [name] PARoxetine (PAXIL) 10 MG tablet Take 20 mg by mouth every morning.       [name] (SYSTANE OP) Apply to eye as needed.      . potassium chloride SA (K-DUR,KLOR-CON) 20 MEQ tablet Take 2 tablets (40 mEq total) by mouth daily.      [name] senna-docusate (SENOKOT-S) 8.6-50 MG per tablet Take 1 tablet by mouth daily.        . simvastatin (ZOCOR) 20 MG tablet Take 20 mg by mouth at bedtime.        [name] spironolactone (ALDACTONE) 25 MG tablet Take 0.5 tablets (12.5 mg total) by mouth daily.  15 tablet  2  . traMADol (ULTRAM) 50 MG tablet Take 50 mg by mouth every 6 (six) hours as needed for pain.      . [DISCONTINUED] ranitidine (ZANTAC) 300 MG capsule Take 300 mg by mouth daily as needed. Acid indigestion       No current  facility-administered medications for this encounter.     PHYSICAL EXAM: Filed Vitals:   04/12/13 0954  BP: 126/74  Pulse: 77  Height: 5\' 2"  (1.575 m)  Weight: 237 lb 6.4 oz (107.684 kg)  SpO2: 98%    General:  Well appearing. No resp difficulty Daughter and Mother present HEENT: normal Neck: supple. JVP difficult to assess due to body habitus. Carotids 2+ bilaterally; no bruits. No lymphadenopathy or thryomegaly appreciated. Cor: PMI normal. Regular rate & rhythm. No rubs, gallops or murmurs. Lungs: clear Abdomen: obese, soft, nontender, nondistended. No hepatosplenomegaly. No bruits or masses. Good bowel sounds. Extremities: no cyanosis, clubbing, rash,  edema Neuro: alert & orientedx3, cranial nerves grossly intact. Moves all 4 extremities w/o difficulty. Affect pleasant.      ASSESSMENT & PLAN:

## 2013-05-10 ENCOUNTER — Encounter (HOSPITAL_COMMUNITY): Payer: Self-pay

## 2013-05-10 ENCOUNTER — Ambulatory Visit (HOSPITAL_COMMUNITY)
Admission: RE | Admit: 2013-05-10 | Discharge: 2013-05-10 | Disposition: A | Discharge status: Home / Self Care | Payer: PRIVATE HEALTH INSURANCE | Source: Ambulatory Visit | Attending: Internal Medicine | Admitting: Internal Medicine

## 2013-05-10 VITALS — BP 100/60 | HR 72 | Wt 236.1 lb

## 2013-05-10 DIAGNOSIS — G4733 Obstructive sleep apnea (adult) (pediatric): Secondary | ICD-10-CM | POA: Insufficient documentation

## 2013-05-10 DIAGNOSIS — Z923 Personal history of irradiation: Secondary | ICD-10-CM | POA: Insufficient documentation

## 2013-05-10 DIAGNOSIS — K219 Gastro-esophageal reflux disease without esophagitis: Secondary | ICD-10-CM | POA: Insufficient documentation

## 2013-05-10 DIAGNOSIS — K449 Diaphragmatic hernia without obstruction or gangrene: Secondary | ICD-10-CM | POA: Insufficient documentation

## 2013-05-10 DIAGNOSIS — E785 Hyperlipidemia, unspecified: Secondary | ICD-10-CM | POA: Insufficient documentation

## 2013-05-10 DIAGNOSIS — Z95810 Presence of automatic (implantable) cardiac defibrillator: Secondary | ICD-10-CM | POA: Insufficient documentation

## 2013-05-10 DIAGNOSIS — I509 Heart failure, unspecified: Secondary | ICD-10-CM | POA: Insufficient documentation

## 2013-05-10 DIAGNOSIS — I428 Other cardiomyopathies: Secondary | ICD-10-CM | POA: Insufficient documentation

## 2013-05-10 DIAGNOSIS — I10 Essential (primary) hypertension: Secondary | ICD-10-CM | POA: Insufficient documentation

## 2013-05-10 DIAGNOSIS — Z9221 Personal history of antineoplastic chemotherapy: Secondary | ICD-10-CM | POA: Insufficient documentation

## 2013-05-10 DIAGNOSIS — I4891 Unspecified atrial fibrillation: Secondary | ICD-10-CM | POA: Insufficient documentation

## 2013-05-10 DIAGNOSIS — Z853 Personal history of malignant neoplasm of breast: Secondary | ICD-10-CM | POA: Insufficient documentation

## 2013-05-10 DIAGNOSIS — I447 Left bundle-branch block, unspecified: Secondary | ICD-10-CM | POA: Insufficient documentation

## 2013-05-10 DIAGNOSIS — I5022 Chronic systolic (congestive) heart failure: Secondary | ICD-10-CM

## 2013-05-10 NOTE — Patient Instructions (Addendum)
Follow up in 2 months with and ECHO and Dr Leory Plowman  Do the following things EVERYDAY: 1) Weigh yourself in the morning before breakfast. Write it down and keep it in a log. 2) Take your medicines as prescribed 3) Eat low salt foods-Limit salt (sodium) to 2000 mg per day.  4) Stay as active as you can everyday 5) Limit all fluids for the day to less than 2 liters

## 2013-05-10 NOTE — Assessment & Plan Note (Signed)
NYHA II. Functionally  she is better and able to complete 2 miles on a stationary bike.  Volume status stable. Continue current diuretic regimen. Continue current dose of beta blocker and ARB cue to soft BP. Continue cardiac rehab. Reinforced daily weights, low salt food choices, and limiting fluid intake to < 2 liters per day. Follow up in 2 months with and ECHO and Dr Gala Romney. If EF remains reduced will need discuss advanced therapies again.

## 2013-05-10 NOTE — Progress Notes (Signed)
Patient ID: [name], female   DOB: 08/18/1951, 62 y.o.   MRN: [id number] Primary Cardiologist: Dr. Clifton James PCP: Dr. Shary Decamp Oncologist: Dr. Amie Critchley at [facility] EP: Dr. Johney Frame  Dr. Shary Decamp in [place] Weight Range   220-224 pounds  Baseline proBNP     HPI:  [name] is a 62 y.o. female a cardiac history that includes HTN, Type B aortic dissection (2010) managed with medical therapy by Dr. Donata Clay and has healed, LBBB, systolic heart failure due to NICM with EF 10-15% in May 2013 which has decreased 65% in 2010.  Cath Dec 2011 showed normal arteries.  She was diagnosed with breast cancer in 2009 s/p lumpectomy, XRT, and chemotherapy.  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   She underwent St Jude Bi-V ICD implantation Sept 2012 but unable to place LV lead.  She eventually brought back in Nov 2012 for left thoracotomy and placement of epicardial LV lead placement by Dr. Donata Clay.  She also had a sleep study in Jan that showed mild OSA/hypopnea syndrome.  AHI of 6 events per hour with O2 desat as low as 85%.  No clinical intervention occurred.    10/10/2012 Peak VO2: 10.6 % predicted peak VO2: 64.4% VE/VCO2 slope: 26.9 OUES: 1.18 Peak RER: 1.29 Ventilatory Threshold: 7.3 % predicted peak VO2: 44.4% Peak RR 23 Peak Ventilation: 29.8 VE/MVV: 32.6% PETCO2 at peak: 43  ECHO 01/02/13 EF 15% Grade 1 diastolic dysfunction. RV ok. No PAH  She returns for follow up today with daughter.  Last visit she continue 12.5 mg carvedilol in am and increasef night time carvedilol to 18.75 mg in pm.  Initially she noticed a reduced HR < 50 but now her HR has been 60-70 at home. She has also developed gout in her hip and was started on colchicine. Gout resolving. She will follow up with a rheumatologist.  Now she is feeling good. Denies SOB/PND/Orthopnea. Does admit to mild dyspnea with exertion but says that has improved. Able to  perform 2 miles on a stationary bike. Weight at home 229-232 pounds. Drinking < than 2 liters per day. Tries to follow low salt diet. She continues with cardiac rehab at [facility]     ROS: All systems negative except as listed in HPI, PMH and Problem List.  Past Medical History  Diagnosis Date  . Nonischemic cardiomyopathy     Admission 12/11 with EF 25%, normal coronary arteries by cath 12/11; NICM presumed to be secondary to chemotherapy for breast cancer  . DVT (deep venous thrombosis) 10/18/2010  . Obesity   . 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 Glaucoma   . Depression   . Breast cancer 07/2009    Stage 3 lumpectomy, radiation, chemotherapy; finished chemo October, felt to be in remission  . History of shingles   . Hyperlipidemia   . Chronic systolic dysfunction of left ventricle   . Left bundle branch block   . Cardiac defibrillator in place     St. Jude (JA) 9/12; LV lead placement unsuccessful;  s/p left thoracotomy with placement of epicardial LV lead 09/2011 (Dr. PVT)  . [name]     with exertion  . Hypertension   . CHF (congestive heart failure)   . GERD (gastroesophageal reflux disease)   . Arthritis     lower back  . Hiatal hernia     small hiatal hernia  . Atrial  fibrillation     post op (left thoracotomy) 11/12 req amio/DCCV    Current Outpatient Prescriptions  Medication Sig Dispense Refill  . albuterol (PROVENTIL HFA;VENTOLIN HFA) 108 (90 BASE) MCG/ACT inhaler Inhale 2 puffs into the lungs every 4 (four) hours as needed. [name]       . albuterol (PROVENTIL) (2.5 MG/3ML) 0.083% nebulizer solution Take 2.5 mg by nebulization as needed. [name]       . ALPRAZolam (XANAX) 1 MG tablet Take 1 mg by mouth as needed. Anxiety       . aspirin 81 MG EC tablet Take 81 mg by mouth daily.        . Bepotastine Besilate (BEPREVE) 1.5 % SOLN Place 1 drop into both eyes as needed. Dry eyes       . bimatoprost (LUMIGAN) 0.03  % ophthalmic solution Place 1 drop into both eyes at bedtime.        . Calcium Carbonate-Vitamin D (CALCIUM-VITAMIN D3) 600-200 MG-UNIT TABS Take 1 tablet by mouth 2 (two) times daily.        . carvedilol (COREG) 12.5 MG tablet Take 12.5 mg in am and 18.75 mg in pm  90 tablet  3  . cholecalciferol (VITAMIN D) 1000 UNITS tablet Take 1,000 Units by mouth 2 (two) times daily.        . colchicine 0.6 MG tablet Take 0.6 mg by mouth daily.      [name] DIOVAN 40 MG tablet TAKE ONE TABLET TWICE DAILY  60 tablet  6  . fexofenadine (ALLEGRA) 180 MG tablet Take 180 mg by mouth as needed. Allergies       . fluticasone (FLONASE) 50 MCG/ACT nasal spray Place 2 sprays into the nose as needed. Allergies       . furosemide (LASIX) 40 MG tablet Take 1 tablet (40 mg total) by mouth 2 (two) times daily.  180 tablet  5  . letrozole (FEMARA) 2.5 MG tablet Take 2.5 mg by mouth every evening.        . loratadine (CLARITIN) 10 MG tablet Take 10 mg by mouth daily as needed. For allergies       . montelukast (SINGULAIR) 10 MG tablet Take 10 mg by mouth as needed. For allergies & congestion      . Multiple Vitamin (MULTIVITAMIN) tablet Take 1 tablet by mouth daily.        . niacin (NIASPAN) 1000 MG CR tablet Take 500 mg by mouth at bedtime.       [name] 20 MG capsule Take 20 mg by mouth 2 (two) times daily.        [name] oxyCODONE-acetaminophen (PERCOCET) 5-325 MG per tablet Take 1 tablet by mouth every 4 (four) hours as needed.      [name] PARoxetine (PAXIL) 10 MG tablet Take 20 mg by mouth every morning.       [name] (SYSTANE OP) Apply to eye as needed.      . potassium chloride SA (K-DUR,KLOR-CON) 20 MEQ tablet Take 2 tablets (40 mEq total) by mouth daily.      [name] senna-docusate (SENOKOT-S) 8.6-50 MG per tablet Take 1 tablet by mouth daily.        . simvastatin (ZOCOR) 20 MG tablet Take 20 mg by mouth at bedtime.        [name] spironolactone (ALDACTONE) 25 MG tablet Take 0.5 tablets (12.5 mg  total) by mouth daily.  15 tablet  2  . traMADol (  ULTRAM) 50 MG tablet Take 50 mg by mouth every 6 (six) hours as needed for pain.      . [DISCONTINUED] ranitidine (ZANTAC) 300 MG capsule Take 300 mg by mouth daily as needed. Acid indigestion       No current facility-administered medications for this encounter.     PHYSICAL EXAM: Filed Vitals:   05/10/13 0906  BP: 100/60  Pulse: 72  Weight: 236 lb 1.9 oz (107.103 kg)  SpO2: 95%    General:  Well appearing. No resp difficulty Daughter  present HEENT: normal Neck: supple. JVP difficult to assess due to body habitus but does not appear elevated. Carotids 2+ bilaterally; no bruits. No lymphadenopathy or thryomegaly appreciated. Cor: PMI normal. Regular rate & rhythm. No rubs, gallops or murmurs. Lungs: clear Abdomen: obese, soft, nontender, nondistended. No hepatosplenomegaly. No bruits or masses. Good bowel sounds. Extremities: no cyanosis, clubbing, rash,  edema Neuro: alert & orientedx3, cranial nerves grossly intact. Moves all 4 extremities w/o difficulty. Affect pleasant.      ASSESSMENT & PLAN:

## 2013-05-13 ENCOUNTER — Encounter: Payer: Self-pay | Admitting: Internal Medicine

## 2013-06-05 ENCOUNTER — Telehealth: Payer: Self-pay | Admitting: Internal Medicine

## 2013-06-05 ENCOUNTER — Telehealth (HOSPITAL_COMMUNITY): Payer: Self-pay | Admitting: Adult Health

## 2013-06-05 ENCOUNTER — Other Ambulatory Visit (HOSPITAL_COMMUNITY): Payer: Self-pay | Admitting: Physician Assistant

## 2013-06-05 NOTE — Telephone Encounter (Signed)
lmom for patient to return call.  She needs to send transmission and i will have Belenda Cruise be on the look out for it

## 2013-06-05 NOTE — Telephone Encounter (Signed)
Contacted [name].   Reviewed lab work from PCP.   Renal function ok.   No changes required.    [name]  2:32 PM

## 2013-06-05 NOTE — Telephone Encounter (Signed)
New Prob  Pt states she felt like she had a jolt last night.  She would like to speak with you.

## 2013-06-10 ENCOUNTER — Encounter: Payer: Self-pay | Admitting: Internal Medicine

## 2013-06-10 ENCOUNTER — Ambulatory Visit (INDEPENDENT_AMBULATORY_CARE_PROVIDER_SITE_OTHER): Payer: PRIVATE HEALTH INSURANCE | Admitting: *Deleted

## 2013-06-10 DIAGNOSIS — Z95810 Presence of automatic (implantable) cardiac defibrillator: Secondary | ICD-10-CM

## 2013-06-10 DIAGNOSIS — I428 Other cardiomyopathies: Secondary | ICD-10-CM

## 2013-06-10 DIAGNOSIS — I5022 Chronic systolic (congestive) heart failure: Secondary | ICD-10-CM

## 2013-06-10 NOTE — Telephone Encounter (Signed)
Spoke w/pt in regards to transmission. Transmission showed no episodes recorded with therapy. Pt aware. Pt thinks may have been dreaming and just woke up thinking she received jolt.

## 2013-06-11 LAB — REMOTE ICD DEVICE
HV IMPEDENCE: 95 Ohm
RV LEAD AMPLITUDE: 12 mv
RV LEAD IMPEDENCE ICD: 400 Ohm
VENTRICULAR PACING ICD: 97 pct

## 2013-07-03 ENCOUNTER — Encounter: Payer: Self-pay | Admitting: *Deleted

## 2013-07-10 ENCOUNTER — Encounter (HOSPITAL_COMMUNITY): Payer: Self-pay

## 2013-07-10 ENCOUNTER — Ambulatory Visit (HOSPITAL_COMMUNITY)
Admission: RE | Admit: 2013-07-10 | Discharge: 2013-07-10 | Disposition: A | Discharge status: Home / Self Care | Payer: PRIVATE HEALTH INSURANCE | Source: Ambulatory Visit | Attending: Internal Medicine | Admitting: Internal Medicine

## 2013-07-10 ENCOUNTER — Ambulatory Visit (HOSPITAL_BASED_OUTPATIENT_CLINIC_OR_DEPARTMENT_OTHER)
Admission: RE | Admit: 2013-07-10 | Discharge: 2013-07-10 | Disposition: A | Discharge status: Home / Self Care | Payer: PRIVATE HEALTH INSURANCE | Source: Ambulatory Visit | Attending: Internal Medicine | Admitting: Internal Medicine

## 2013-07-10 VITALS — BP 76/52 | HR 94 | Wt 237.8 lb

## 2013-07-10 DIAGNOSIS — I059 Rheumatic mitral valve disease, unspecified: Secondary | ICD-10-CM

## 2013-07-10 DIAGNOSIS — I079 Rheumatic tricuspid valve disease, unspecified: Secondary | ICD-10-CM | POA: Insufficient documentation

## 2013-07-10 DIAGNOSIS — I4891 Unspecified atrial fibrillation: Secondary | ICD-10-CM | POA: Insufficient documentation

## 2013-07-10 DIAGNOSIS — R0989 Other specified symptoms and signs involving the circulatory and respiratory systems: Secondary | ICD-10-CM | POA: Insufficient documentation

## 2013-07-10 DIAGNOSIS — I5022 Chronic systolic (congestive) heart failure: Secondary | ICD-10-CM

## 2013-07-10 DIAGNOSIS — I509 Heart failure, unspecified: Secondary | ICD-10-CM | POA: Insufficient documentation

## 2013-07-10 DIAGNOSIS — E669 Obesity, unspecified: Secondary | ICD-10-CM | POA: Insufficient documentation

## 2013-07-10 DIAGNOSIS — I428 Other cardiomyopathies: Secondary | ICD-10-CM | POA: Insufficient documentation

## 2013-07-10 DIAGNOSIS — R0609 Other forms of dyspnea: Secondary | ICD-10-CM | POA: Insufficient documentation

## 2013-07-10 NOTE — Progress Notes (Signed)
  Echocardiogram 2D Echocardiogram has been performed.  [name] [name] 07/10/2013, 10:10 AM

## 2013-07-10 NOTE — Progress Notes (Addendum)
Patient ID: [name], female   DOB: April 05, 1951, 62 y.o.   MRN: [id number] Primary Cardiologist: Dr. Clifton James PCP: Dr. Shary Decamp Oncologist: Dr. Amie Critchley at [facility] EP: Dr. Johney Frame  Dr. Shary Decamp in [place] Weight Range   220-224 pounds  Baseline proBNP     HPI:  [name] is a 62 y.o. female a cardiac history that includes HTN, Type B aortic dissection (2010) managed with medical therapy by Dr. Donata Clay and has healed, LBBB, systolic heart failure due to NICM with EF 10-15% in May 2013 which has decreased 65% in 2010.  Cath Dec 2011 showed normal arteries.  She was diagnosed with breast cancer in 2009 s/p lumpectomy, XRT, and chemotherapy.  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   She underwent St Jude Bi-V ICD implantation Sept 2012 but unable to place LV lead.  She eventually brought back in Nov 2012 for left thoracotomy and placement of epicardial LV lead placement by Dr. Donata Clay.  She also had a sleep study in Jan that showed mild OSA/hypopnea syndrome.  AHI of 6 events per hour with O2 desat as low as 85%.  No clinical intervention occurred.    10/10/2012 Peak VO2: 10.6 % predicted peak VO2: 64.4% VE/VCO2 slope: 26.9 OUES: 1.18 Peak RER: 1.29 Ventilatory Threshold: 7.3 % predicted peak VO2: 44.4% Peak RR 23 Peak Ventilation: 29.8 VE/MVV: 32.6% PETCO2 at peak: 43  ECHO 01/02/13 EF 15% Grade 1 diastolic dysfunction. RV ok. No PAH ECHO 07/10/13 EF 20% RV ok  Labs 06/01/13 Creatinine 1.2 Potassium 4.4  She returns for follow up today with daughter. Denies SOB/PNd/Orthopnea. Does admit to  Weight at home 229-231 pounds. She has required one additional lasix for edema. SBP in cardiac rehab 103/60. . Drinking < than 2 liters per day. Tries to follow low salt diet. She completed cardiac rehab and now on the Master program at [facility].  Able to complete 4 miles on bike or stationary bike.    ROS: All  systems negative except as listed in HPI, PMH and Problem List.  Past Medical History  Diagnosis Date  . Nonischemic cardiomyopathy     Admission 12/11 with EF 25%, normal coronary arteries by cath 12/11; NICM presumed to be secondary to chemotherapy for breast cancer  . DVT (deep venous thrombosis) 10/18/2010  . Obesity   . 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 Glaucoma   . Depression   . Breast cancer 07/2009    Stage 3 lumpectomy, radiation, chemotherapy; finished chemo October, felt to be in remission  . History of shingles   . Hyperlipidemia   . Chronic systolic dysfunction of left ventricle   . Left bundle branch block   . Cardiac defibrillator in place     St. Jude (JA) 9/12; LV lead placement unsuccessful;  s/p left thoracotomy with placement of epicardial LV lead 09/2011 (Dr. PVT)  . [name]     with exertion  . Hypertension   . CHF (congestive heart failure)   . GERD (gastroesophageal reflux disease)   . Arthritis     lower back  . Hiatal hernia     small hiatal hernia  . Atrial fibrillation     post op (left thoracotomy) 11/12 req amio/DCCV    Current Outpatient Prescriptions  Medication Sig Dispense Refill  . albuterol (PROVENTIL HFA;VENTOLIN HFA) 108 (90 BASE) MCG/ACT inhaler Inhale 2 puffs into the lungs every  4 (four) hours as needed. [name]       . albuterol (PROVENTIL) (2.5 MG/3ML) 0.083% nebulizer solution Take 2.5 mg by nebulization as needed. [name]       . ALPRAZolam (XANAX) 1 MG tablet Take 1 mg by mouth as needed. Anxiety       . aspirin 81 MG EC tablet Take 81 mg by mouth daily.        . Bepotastine Besilate (BEPREVE) 1.5 % SOLN Place 1 drop into both eyes as needed. Dry eyes       . bimatoprost (LUMIGAN) 0.03 % ophthalmic solution Place 1 drop into both eyes at bedtime.        . Calcium Carbonate-Vitamin D (CALCIUM-VITAMIN D3) 600-200 MG-UNIT TABS Take 1 tablet by mouth 2 (two) times daily.        .  carvedilol (COREG) 12.5 MG tablet Take 12.5 mg in am and 18.75 mg in pm  90 tablet  3  . cholecalciferol (VITAMIN D) 1000 UNITS tablet Take 1,000 Units by mouth 2 (two) times daily.        . colchicine 0.6 MG tablet Take 0.6 mg by mouth daily.      [name] DIOVAN 40 MG tablet TAKE ONE TABLET TWICE DAILY  60 tablet  6  . fexofenadine (ALLEGRA) 180 MG tablet Take 180 mg by mouth as needed. Allergies       . fluticasone (FLONASE) 50 MCG/ACT nasal spray Place 2 sprays into the nose as needed. Allergies       . furosemide (LASIX) 40 MG tablet Take 1 tablet (40 mg total) by mouth 2 (two) times daily.  180 tablet  5  . letrozole (FEMARA) 2.5 MG tablet Take 2.5 mg by mouth every evening.        . loratadine (CLARITIN) 10 MG tablet Take 10 mg by mouth daily as needed. For allergies       . montelukast (SINGULAIR) 10 MG tablet Take 10 mg by mouth as needed. For allergies & congestion      . Multiple Vitamin (MULTIVITAMIN) tablet Take 1 tablet by mouth daily.        . niacin (NIASPAN) 1000 MG CR tablet Take 500 mg by mouth at bedtime.       [name] 20 MG capsule Take 20 mg by mouth 2 (two) times daily.        [name] oxyCODONE-acetaminophen (PERCOCET) 5-325 MG per tablet Take 1 tablet by mouth every 4 (four) hours as needed.      [name] PARoxetine (PAXIL) 10 MG tablet Take 20 mg by mouth every morning.       [name] (SYSTANE OP) Apply to eye as needed.      . potassium chloride SA (K-DUR,KLOR-CON) 20 MEQ tablet Take 2 tablets (40 mEq total) by mouth daily.      [name] senna-docusate (SENOKOT-S) 8.6-50 MG per tablet Take 1 tablet by mouth daily.        . simvastatin (ZOCOR) 20 MG tablet Take 20 mg by mouth at bedtime.        [name] spironolactone (ALDACTONE) 25 MG tablet TAKE 1/2 TABLET EVERY DAY  15 tablet  2  . traMADol (ULTRAM) 50 MG tablet Take 50 mg by mouth every 6 (six) hours as needed for pain.      . [DISCONTINUED] ranitidine (ZANTAC) 300 MG capsule Take 300 mg by mouth daily as  needed. Acid indigestion  No current facility-administered medications for this encounter.     PHYSICAL EXAM: Filed Vitals:   07/10/13 1009  BP: 76/52  Pulse: 94  Weight: 237 lb 12.8 oz (107.865 kg)  SpO2: 98%    General:  Well appearing. No resp difficulty Daughter  present HEENT: normal Neck: supple. JVP difficult to assess due to body habitus but does not appear elevated. Carotids 2+ bilaterally; no bruits. No lymphadenopathy or thryomegaly appreciated. Cor: PMI normal. Regular rate & rhythm. No rubs, gallops or murmurs. Lungs: clear Abdomen: obese, soft, nontender, nondistended. No hepatosplenomegaly. No bruits or masses. Good bowel sounds. Extremities: no cyanosis, clubbing, rash,  edema Neuro: alert & orientedx3, cranial nerves grossly intact. Moves all 4 extremities w/o difficulty. Affect pleasant.      ASSESSMENT & PLAN:  1. Chronic Systolic Heart Failure. NICM S/P St Jude Bi-V ICD 2012. Dr Gala Romney reviewed ECHO EF 20%. No change noted. 10/10/2012 Peak VO2: 10.6 % predicted peak VO2: 64.4%VE/VCO2 slope: 26.9OUES: 1.18Peak RER: 1.29. Discussed advanced therapaies in the past however she declined. NYHA IIIB.   -Volume status. Continue lasix 40 mg bid and spironolactone 12.5 mg daily -Continue carvedilol 12.5 im am and 18.75 mg in pm unable to titrate due to soft BP.  -Continue diovan 40 mg twice a day -Continue cardiac rehab. She is instructed to contact HF clinic if she has a functional decline or increased dyspnea.    Follow up in 2 months  CLEGG,AMY 1:53 PM  Attending: Symptomatically much improved but echo still with severe LV dysfunction. We will continue aggressive medical therapy. If symptoms are worsening will need repeat CPX study and consideration of advanced therapies.   Kashana Breach,MD 12:30 PM

## 2013-07-10 NOTE — Patient Instructions (Addendum)
Do the following things EVERYDAY: 1) Weigh yourself in the morning before breakfast. Write it down and keep it in a log. 2) Take your medicines as prescribed 3) Eat low salt foods-Limit salt (sodium) to 2000 mg per day.  4) Stay as active as you can everyday 5) Limit all fluids for the day to less than 2 liters   Follow up in 2 months

## 2013-07-12 ENCOUNTER — Telehealth (HOSPITAL_COMMUNITY): Payer: Self-pay | Admitting: Adult Health

## 2013-07-12 NOTE — Telephone Encounter (Signed)
Provided with ECHO results.  EF unchanged. Remains 15%. [name] verbalized understanding.  InstructedFollow up in 2 months  CLEGG,AMY 10:45 AM

## 2013-08-12 ENCOUNTER — Other Ambulatory Visit (HOSPITAL_COMMUNITY): Payer: Self-pay | Admitting: Internal Medicine

## 2013-08-12 DIAGNOSIS — I5022 Chronic systolic (congestive) heart failure: Secondary | ICD-10-CM

## 2013-09-05 ENCOUNTER — Other Ambulatory Visit: Payer: Self-pay

## 2013-09-05 MED ORDER — SPIRONOLACTONE 25 MG PO TABS: ORAL_TABLET | ORAL | Status: DC

## 2013-09-09 ENCOUNTER — Ambulatory Visit (INDEPENDENT_AMBULATORY_CARE_PROVIDER_SITE_OTHER): Payer: PRIVATE HEALTH INSURANCE | Admitting: *Deleted

## 2013-09-09 ENCOUNTER — Encounter: Payer: Self-pay | Admitting: Internal Medicine

## 2013-09-09 DIAGNOSIS — Z95810 Presence of automatic (implantable) cardiac defibrillator: Secondary | ICD-10-CM

## 2013-09-09 DIAGNOSIS — I5022 Chronic systolic (congestive) heart failure: Secondary | ICD-10-CM

## 2013-09-09 DIAGNOSIS — I428 Other cardiomyopathies: Secondary | ICD-10-CM

## 2013-09-10 LAB — MDC_IDC_ENUM_SESS_TYPE_REMOTE
Battery Remaining Longevity: 23 mo
Battery Remaining Percentage: 54 %
Battery Voltage: 2.9 V
Brady Statistic AP VS Percent: 3.7 %
Brady Statistic AS VS Percent: 1.4 %
Brady Statistic RV Percent Paced: 95 %
HighPow Impedance: 95 Ohm
Implantable Pulse Generator Serial Number: 7004282
Lead Channel Impedance Value: 290 Ohm
Lead Channel Impedance Value: 380 Ohm
Lead Channel Impedance Value: 460 Ohm
Lead Channel Pacing Threshold Amplitude: 0.75 V
Lead Channel Pacing Threshold Pulse Width: 0.5 ms
Lead Channel Pacing Threshold Pulse Width: 0.5 ms
Lead Channel Sensing Intrinsic Amplitude: 12 mV
Lead Channel Setting Pacing Amplitude: 3.5 V
Lead Channel Setting Pacing Pulse Width: 0.5 ms
Lead Channel Setting Pacing Pulse Width: 1 ms
Lead Channel Setting Sensing Sensitivity: 0.5 mV
Zone Setting Detection Interval: 250 ms

## 2013-09-13 ENCOUNTER — Encounter (HOSPITAL_COMMUNITY): Payer: Self-pay

## 2013-09-13 ENCOUNTER — Ambulatory Visit (HOSPITAL_COMMUNITY)
Admission: RE | Admit: 2013-09-13 | Discharge: 2013-09-13 | Disposition: A | Discharge status: Home / Self Care | Payer: PRIVATE HEALTH INSURANCE | Source: Ambulatory Visit | Attending: Internal Medicine | Admitting: Internal Medicine

## 2013-09-13 VITALS — BP 88/56 | HR 89 | Wt 240.4 lb

## 2013-09-13 DIAGNOSIS — I5022 Chronic systolic (congestive) heart failure: Secondary | ICD-10-CM | POA: Insufficient documentation

## 2013-09-13 NOTE — Patient Instructions (Signed)
We will contact you in 3 months to schedule your next appointment.  

## 2013-09-13 NOTE — Progress Notes (Addendum)
Patient ID: [name], female   DOB: [birth year], 62 y.o.   MRN: [id number]  Primary Cardiologist: Dr. Clifton James PCP: Dr. Shary Decamp Oncologist: Dr. Amie Critchley at [facility] EP: Dr. Johney Frame  Dr. Shary Decamp in [place] Weight Range   220-224 pounds  Baseline proBNP     HPI:  [name] is a 62 y.o. female a cardiac history that includes HTN, Type B aortic dissection (2010) managed with medical therapy by Dr. Donata Clay and has healed, LBBB, systolic heart failure due to NICM with EF 10-15% in May 2013 which has decreased 65% in 2010.  Cath Dec 2011 showed normal arteries.  She was diagnosed with breast cancer in 2009 s/p lumpectomy, XRT, and chemotherapy.  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   She underwent St Jude Bi-V ICD implantation Sept 2012 but unable to place LV lead.  She eventually brought back in Nov 2012 for left thoracotomy and placement of epicardial LV lead placement by Dr. Donata Clay.  She also had a sleep study in Jan that showed mild OSA/hypopnea syndrome.  AHI of 6 events per hour with O2 desat as low as 85%.  No clinical intervention occurred.    10/10/2012 Peak VO2: 10.6 % predicted peak VO2: 64.4% VE/VCO2 slope: 26.9 OUES: 1.18 Peak RER: 1.29 Ventilatory Threshold: 7.3 % predicted peak VO2: 44.4% Peak RR 23 Peak Ventilation: 29.8 VE/MVV: 32.6% PETCO2 at peak: 43  ECHO 01/02/13 EF 15% Grade 1 diastolic dysfunction. RV ok. No PAH ECHO 07/10/13 EF 20% RV ok  Labs 06/01/13 Creatinine 1.2 Potassium 4.4  She returns for follow up today with daughter. Since we last saw her saw Dr. Titus Dubin in Rheumatology. Auto-immune work-up was negative. Continue to manage gouty arthritis. Was some concern about rising creatinine. Doing fairly well. Able to get around better does ADLs without much difficulty. Can walk around store without stopping much. No orthopnea or PND. Weight trending up a little. Continues to weigh daily.  BP remains low. Occasional dizziness.   ROS: All systems negative except as listed in HPI, PMH and Problem List.  Past Medical History  Diagnosis Date  . Nonischemic cardiomyopathy     Admission 12/11 with EF 25%, normal coronary arteries by cath 12/11; NICM presumed to be secondary to chemotherapy for breast cancer  . DVT (deep venous thrombosis) 10/18/2010  . Obesity   . 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 Glaucoma   . Depression   . Breast cancer 07/2009    Stage 3 lumpectomy, radiation, chemotherapy; finished chemo October, felt to be in remission  . History of shingles   . Hyperlipidemia   . Chronic systolic dysfunction of left ventricle   . Left bundle branch block   . Cardiac defibrillator in place     St. Jude (JA) 9/12; LV lead placement unsuccessful;  s/p left thoracotomy with placement of epicardial LV lead 09/2011 (Dr. PVT)  . [name]     with exertion  . Hypertension   . CHF (congestive heart failure)   . GERD (gastroesophageal reflux disease)   . Arthritis     lower back  . Hiatal hernia     small hiatal hernia  . Atrial fibrillation     post op (left thoracotomy) 11/12 req amio/DCCV    Current Outpatient Prescriptions  Medication Sig Dispense Refill  . albuterol (PROVENTIL HFA;VENTOLIN HFA) 108 (90 BASE) MCG/ACT inhaler Inhale 2 puffs into the lungs  every 4 (four) hours as needed. [name]       . albuterol (PROVENTIL) (2.5 MG/3ML) 0.083% nebulizer solution Take 2.5 mg by nebulization as needed. [name]       . allopurinol (ZYLOPRIM) 100 MG tablet Take 50 mg by mouth daily.      [name] ALPRAZolam (XANAX) 1 MG tablet Take 1 mg by mouth as needed. Anxiety       . aspirin 81 MG EC tablet Take 81 mg by mouth daily.        . Bepotastine Besilate (BEPREVE) 1.5 % SOLN Place 1 drop into both eyes as needed. Dry eyes       . bimatoprost (LUMIGAN) 0.03 % ophthalmic solution Place 1 drop into both eyes at bedtime.        . Calcium  Carbonate-Vitamin D (CALCIUM-VITAMIN D3) 600-200 MG-UNIT TABS Take 1 tablet by mouth 2 (two) times daily.        . carvedilol (COREG) 12.5 MG tablet Take 12.5 mg in am and 18.75 mg in pm  90 tablet  3  . carvedilol (COREG) 12.5 MG tablet Take 1 tablet (12.5 mg total) by mouth every morning. And take 1.5 tab (18.75 mg) in PM  75 tablet  3  . cholecalciferol (VITAMIN D) 1000 UNITS tablet Take 1,000 Units by mouth 2 (two) times daily.        [name] DIOVAN 40 MG tablet TAKE ONE TABLET TWICE DAILY  60 tablet  6  . fexofenadine (ALLEGRA) 180 MG tablet Take 180 mg by mouth as needed. Allergies       . fluticasone (FLONASE) 50 MCG/ACT nasal spray Place 2 sprays into the nose as needed. Allergies       . furosemide (LASIX) 40 MG tablet Take 1 tablet (40 mg total) by mouth 2 (two) times daily.  180 tablet  5  . letrozole (FEMARA) 2.5 MG tablet Take 2.5 mg by mouth every evening.        . loratadine (CLARITIN) 10 MG tablet Take 10 mg by mouth daily as needed. For allergies       . montelukast (SINGULAIR) 10 MG tablet Take 10 mg by mouth as needed. For allergies & congestion      . Multiple Vitamin (MULTIVITAMIN) tablet Take 1 tablet by mouth daily.        . niacin (NIASPAN) 1000 MG CR tablet Take 500 mg by mouth at bedtime.       [name] 20 MG capsule Take 20 mg by mouth 2 (two) times daily.        [name] oxyCODONE-acetaminophen (PERCOCET) 5-325 MG per tablet Take 1 tablet by mouth every 4 (four) hours as needed.      [name] PARoxetine (PAXIL) 10 MG tablet Take 20 mg by mouth every morning.       [name] (SYSTANE OP) Apply to eye as needed.      . potassium chloride SA (K-DUR,KLOR-CON) 20 MEQ tablet Take 2 tablets (40 mEq total) by mouth daily.      [name] senna-docusate (SENOKOT-S) 8.6-50 MG per tablet Take 1 tablet by mouth daily.        . simvastatin (ZOCOR) 20 MG tablet Take 20 mg by mouth at bedtime.        [name] spironolactone (ALDACTONE) 25 MG tablet TAKE 1/2 TABLET EVERY DAY  15  tablet  2  . traMADol (ULTRAM) 50 MG tablet Take 50 mg by mouth every 6 (six) hours  as needed for pain.      . [DISCONTINUED] ranitidine (ZANTAC) 300 MG capsule Take 300 mg by mouth daily as needed. Acid indigestion       No current facility-administered medications for this encounter.     PHYSICAL EXAM: Filed Vitals:   09/13/13 0945  BP: 88/56  Pulse: 89  Weight: 240 lb 6.4 oz (109.045 kg)  SpO2: 98%    General:  Well appearing. No resp difficulty Daughter  present HEENT: normal Neck: supple. JVP difficult to assess due to body habitus but does not appear elevated. Carotids 2+ bilaterally; no bruits. No lymphadenopathy or thryomegaly appreciated. Cor: PMI normal. Regular rate & rhythm. No rubs, gallops or murmurs. Lungs: clear Abdomen: obese, soft, nontender, nondistended. No hepatosplenomegaly. No bruits or masses. Good bowel sounds. Extremities: no cyanosis, clubbing, rash,  edema Neuro: alert & orientedx3, cranial nerves grossly intact. Moves all 4 extremities w/o difficulty. Affect pleasant.    ASSESSMENT & PLAN:  1. Chronic Systolic Heart Failure. NICM S/P St Jude Bi-V ICD 2012. Dr Gala Romney reviewed ECHO EF 20%. No change noted. 10/10/2012 Peak VO2: 10.6 % predicted peak VO2: 64.4%VE/VCO2 slope: 26.9OUES: 1.18Peak RER: 1.29. Discussed advanced therapaies in the past however she declined.   --Although BP remains low, overall HF much improved. Now NYHA II-III. Volume status looks good. I think CR has really helped and encourage to go to maintenance program.  --BP too low to titrate meds further --Reinforced need for daily weights and reviewed use of sliding scale diuretics. --If symptoms are worsening will need repeat CPX study and consideration of advanced therapies.  --check BMET  Daniel Bensimhon,MD 10:00 AM

## 2013-09-13 NOTE — Addendum Note (Signed)
Encounter addended by: Noralee Space, RN on: 09/13/2013 10:19 AM<BR>     Documentation filed: Patient Instructions Section

## 2013-09-18 ENCOUNTER — Encounter: Payer: Self-pay | Admitting: *Deleted

## 2013-09-19 ENCOUNTER — Telehealth (HOSPITAL_COMMUNITY): Payer: Self-pay | Admitting: Anesthesiology

## 2013-09-19 MED ORDER — FUROSEMIDE 40 MG PO TABS: ORAL_TABLET | ORAL | Status: DC

## 2013-09-19 NOTE — Telephone Encounter (Signed)
Received labs 09/18/13:  K+ 4.4, Cr 1.4   Cr slightly elevated last Cr 1.2 and previous 1.0. Have asked the patient to change lasix to 40 mg BID alternating with 40 mg daily. If weight starts trending up go back to 40 mg BID. She is aware.   Cholesterol and LDL elevated, PCP manages. Told patient to make sure she follows up with them.  Ulla Potash B NP-C 5:19 PM

## 2013-10-02 ENCOUNTER — Other Ambulatory Visit (HOSPITAL_COMMUNITY): Payer: Self-pay | Admitting: Internal Medicine

## 2013-10-10 ENCOUNTER — Encounter: Payer: Self-pay | Admitting: Cardiovascular Disease

## 2013-10-10 ENCOUNTER — Other Ambulatory Visit: Payer: Self-pay | Admitting: *Deleted

## 2013-10-10 ENCOUNTER — Ambulatory Visit (INDEPENDENT_AMBULATORY_CARE_PROVIDER_SITE_OTHER): Payer: PRIVATE HEALTH INSURANCE | Admitting: Cardiovascular Disease

## 2013-10-10 VITALS — BP 100/57 | HR 80 | Ht 61.0 in | Wt 243.0 lb

## 2013-10-10 DIAGNOSIS — Z95810 Presence of automatic (implantable) cardiac defibrillator: Secondary | ICD-10-CM

## 2013-10-10 DIAGNOSIS — I428 Other cardiomyopathies: Secondary | ICD-10-CM

## 2013-10-10 DIAGNOSIS — I5022 Chronic systolic (congestive) heart failure: Secondary | ICD-10-CM

## 2013-10-10 DIAGNOSIS — I4891 Unspecified atrial fibrillation: Secondary | ICD-10-CM

## 2013-10-10 LAB — BASIC METABOLIC PANEL
BUN: 15 mg/dL (ref 6–23)
Chloride: 106 mEq/L (ref 96–112)
Potassium: 3.9 mEq/L (ref 3.5–5.1)
Sodium: 141 mEq/L (ref 135–145)

## 2013-10-10 MED ORDER — FUROSEMIDE 40 MG PO TABS: 40.0000 mg | ORAL_TABLET | Freq: Two times a day (BID) | ORAL | Status: DC

## 2013-10-10 NOTE — Progress Notes (Signed)
History of Present Illness: 62 yo WF with history of HTN, hyperlipidemia, Type B aortic dissection February 2010 and LBBB here today for cardiac follow up. I saw her as a consult at [facility] in February 2010 when she was admitted with abdominal pain. She was found to have LBBB and a Type B aortic dissection which was managed conservatively at the time. She had no other cardiac issues. Her aortic dissection has healed. She has been followed with Dr. Morton Peters for her aortic dissection. She was admitted to [facility] on December 5th, 2011 with volume overload and pulmonary edema. Echo showed EF of 25%. Cardiac cath on 10/06/10 with no evidence of coronary artery disease. It was felt that this may be related to her chemotherapy. She had a DVT in her left leg December 2011. ICD was placed September 2012 by Dr. Johney Frame. LV lead placement was unsuccessful. BiV-ICD box was implanted in hopes that LV lead placement could be re-attempted. She was eventually brought in in 09/2011 for left thoracotomy and successful epicardial LV lead placement with Dr. Donata Clay. That admission was complicated by AFib with RVR that resulted in inappropriate ICD discharges. She required amiodarone due to hypotension and eventual emergent DCCV. Coumadin was deferred at that time due to occurrence in post op setting. 2D Echo 9/14 with LVEF=15%, mild MR. She has been followed in the CHF clinic and was seen there in November 2014.  Lasix cut back to 40 mg po Qdaily alternating with 40 mg po BID every other day due to creatinine of 1.4.   She has been feeling well. No chest pain or SOB. Her weight is up 3lbs over last few weeks. No LE edema.   Primary Care Physician: Shary Decamp   Past Medical History  Diagnosis Date  . Nonischemic cardiomyopathy     Admission 12/11 with EF 25%, normal coronary arteries by cath 12/11; NICM presumed to be secondary to chemotherapy for breast cancer  . DVT (deep venous thrombosis)  10/18/2010  . Obesity   . 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 Glaucoma   . Depression   . Breast cancer 07/2009    Stage 3 lumpectomy, radiation, chemotherapy; finished chemo October, felt to be in remission  . History of shingles   . Hyperlipidemia   . Chronic systolic dysfunction of left ventricle   . Left bundle branch block   . Cardiac defibrillator in place     St. Jude (JA) 9/12; LV lead placement unsuccessful;  s/p left thoracotomy with placement of epicardial LV lead 09/2011 (Dr. PVT)  . [name]     with exertion  . Hypertension   . CHF (congestive heart failure)   . GERD (gastroesophageal reflux disease)   . Arthritis     lower back  . Hiatal hernia     small hiatal hernia  . Atrial fibrillation     post op (left thoracotomy) 11/12 req amio/DCCV    Past Surgical History  Procedure Laterality Date  . Tubal ligation      Bilateral  . Oophorectomy      Single  . Breast lumpectomy      Right breast  . Tonsillectomy    . Cardiac defibrillator placement    . Cardiac catheterization      2011  . Thoracotomy  09/16/2011    Procedure: THORACOTOMY MAJOR;  Surgeon: Kathlee Nations Suann Larry, MD;  Location: [facility] OR;  Service: Thoracic;  Laterality:  Left;  left anterolateral Thoracotomy for placement of St. Jude epicardial pacing lead     Current Outpatient Prescriptions  Medication Sig Dispense Refill  . albuterol (PROVENTIL HFA;VENTOLIN HFA) 108 (90 BASE) MCG/ACT inhaler Inhale 2 puffs into the lungs every 4 (four) hours as needed. [name]       . albuterol (PROVENTIL) (2.5 MG/3ML) 0.083% nebulizer solution Take 2.5 mg by nebulization as needed. [name]       . allopurinol (ZYLOPRIM) 100 MG tablet Take 50 mg by mouth daily.      [name] ALPRAZolam (XANAX) 1 MG tablet Take 1 mg by mouth as needed. Anxiety       . aspirin 81 MG EC tablet Take 81 mg by mouth daily.        . Bepotastine Besilate (BEPREVE) 1.5 % SOLN Place 1 drop into both eyes as  needed. Dry eyes       . bimatoprost (LUMIGAN) 0.03 % ophthalmic solution Place 1 drop into both eyes at bedtime.        . Calcium Carbonate-Vitamin D (CALCIUM-VITAMIN D3) 600-200 MG-UNIT TABS Take 1 tablet by mouth 2 (two) times daily.        . carvedilol (COREG) 12.5 MG tablet Take 1 tablet (12.5 mg total) by mouth every morning. And take 1.5 tab (18.75 mg) in PM  75 tablet  3  . cholecalciferol (VITAMIN D) 1000 UNITS tablet Take 1,000 Units by mouth 2 (two) times daily.        . fexofenadine (ALLEGRA) 180 MG tablet Take 180 mg by mouth as needed. Allergies       . fluticasone (FLONASE) 50 MCG/ACT nasal spray Place 2 sprays into the nose as needed. Allergies       . furosemide (LASIX) 40 MG tablet Take 40 mg daily every other day, alternating with 40 mg twice daily every other day.  60 tablet  5  . letrozole (FEMARA) 2.5 MG tablet Take 2.5 mg by mouth every evening.        . loratadine (CLARITIN) 10 MG tablet Take 10 mg by mouth daily as needed. For allergies       . montelukast (SINGULAIR) 10 MG tablet Take 10 mg by mouth as needed. For allergies & congestion      . Multiple Vitamin (MULTIVITAMIN) tablet Take 1 tablet by mouth daily.        . niacin (NIASPAN) 1000 MG CR tablet Take 500 mg by mouth at bedtime.       [name] 20 MG capsule Take 20 mg by mouth 2 (two) times daily.        [name] oxyCODONE-acetaminophen (PERCOCET) 5-325 MG per tablet Take 1 tablet by mouth every 4 (four) hours as needed.      [name] PARoxetine (PAXIL) 10 MG tablet Take 20 mg by mouth every morning.       [name] (SYSTANE OP) Apply to eye as needed.      . potassium chloride SA (K-DUR,KLOR-CON) 20 MEQ tablet Take 2 tablets (40 mEq total) by mouth daily.      [name] senna-docusate (SENOKOT-S) 8.6-50 MG per tablet Take 1 tablet by mouth daily.        . simvastatin (ZOCOR) 20 MG tablet Take 20 mg by mouth at bedtime.        [name] spironolactone (ALDACTONE) 25 MG tablet TAKE 1/2 TABLET EVERY DAY   15 tablet  2  . traMADol (ULTRAM) 50 MG tablet Take  50 mg by mouth every 6 (six) hours as needed for pain.      . valsartan (DIOVAN) 40 MG tablet TAKE ONE TABLET TWICE DAILY  60 tablet  6  . [DISCONTINUED] ranitidine (ZANTAC) 300 MG capsule Take 300 mg by mouth daily as needed. Acid indigestion       No current facility-administered medications for this visit.    Allergies  Allergen Reactions  . Tape     Blister from clear tapes.    History   Social History  . Marital Status: Married    Spouse Name: N/A    Number of Children: N/A  . Years of Education: N/A   Occupational History  . [employer]  . Retired from Photographer    Social History Main Topics  . Smoking status: Never Smoker   . Smokeless tobacco: Never Used  . Alcohol Use: No  . Drug Use: No  . Sexual Activity: Not on file   Other Topics Concern  . Not on file   Social History Narrative   Married   One child   Lives in [place] with spouse and mother in law   Previously worked as a Public librarian for [employer]      She is a Social worker counsel member.    Family History  Problem Relation Age of Onset  . Hypertension Mother   . Hypertension Father   . Hypertension Brother   . Hypertension Maternal Grandfather   . Heart attack Maternal Grandfather     MI  . Coronary artery disease Maternal Grandfather   . Hypertension Paternal Grandfather   . Heart attack Paternal Grandfather     MI  . Coronary artery disease Paternal Grandfather   . Hypertension Brother     Review of Systems:  As stated in the HPI and otherwise negative.   BP 100/57  Pulse 80  Ht 5\' 1"  (1.549 m)  Wt 243 lb (110.224 kg)  BMI 45.94 kg/m2  Physical Examination: General: Well developed, well nourished, NAD HEENT: OP clear, mucus membranes moist SKIN: warm, dry. No rashes. Neuro: No focal deficits Musculoskeletal: Muscle strength 5/5 all ext Psychiatric: Mood and affect normal Neck: No JVD, no carotid bruits,  no thyromegaly, no lymphadenopathy. Lungs:Clear bilaterally, no wheezes, rhonci, crackles Cardiovascular: Regular rate and rhythm. No murmurs, gallops or rubs. Abdomen:Soft. Bowel sounds present. Non-tender.  Extremities: No lower extremity edema. Pulses are 2 + in the bilateral DP/PT.  Echo 07/10/13: Left ventricle: The cavity size was severely dilated. The estimated ejection fraction was 15%. There is severe diffuse hypokinesis with akinesis of the anterolateral and inferolateral myocardium. Doppler parameters are consistent with abnormal left ventricular relaxation (grade 1 diastolic dysfunction). E/e' is 10 suggesting only mildly elevated filling pressures. - Mitral valve: There is mild functional central mitral regurgitation. - Right ventricle: Systolic function was normal. - Atrial septum: No defect or patent foramen ovale was identified. - Tricuspid valve: Mild regurgitation. Impressions:  - Compared to the prior study, there has been no significant interval change.  Assessment and Plan:   1. Non-ischemic cardiomyopathy: She is doing well. She is on good medical therapy. No changes today. ICD followed by Dr. Johney Frame.   2. A-fib: She has been in sinus. Her only episode of atrial fib was after her ICD lead implant. She has not been on coumadin since this was an isolated event. No changes today.   3. Chronic systolic heart failure: She is managed in the CHF clinic.  Weight is up 3 lbs since she cut back on Lasix last month. This was done because her creatinine bumped to 1.4. Will check BMET today and will go back to Lasix 40 mg po BID if renal function is ok. She will continue to follow weights at home and will continue to follow in the CHF clinic.   She will continue to follow in CHF clinic and EP clinic. I will see her back as needed.   4. S/p ICD: followed by Dr. Johney Frame

## 2013-10-10 NOTE — Patient Instructions (Signed)
Your physician recommends that you schedule a follow-up appointment as needed with Dr. Clifton James.  Continue to follow up as scheduled with Heart Failure Clinic and Dr. Johney Frame.

## 2013-10-28 ENCOUNTER — Other Ambulatory Visit (HOSPITAL_COMMUNITY): Payer: Self-pay

## 2013-10-28 DIAGNOSIS — I5022 Chronic systolic (congestive) heart failure: Secondary | ICD-10-CM

## 2013-10-28 MED ORDER — CARVEDILOL 12.5 MG PO TABS: 12.5000 mg | ORAL_TABLET | ORAL | Status: DC

## 2013-12-02 ENCOUNTER — Other Ambulatory Visit: Payer: Self-pay | Admitting: Cardiovascular Disease

## 2013-12-03 ENCOUNTER — Other Ambulatory Visit (HOSPITAL_COMMUNITY): Payer: Self-pay

## 2013-12-03 MED ORDER — SPIRONOLACTONE 25 MG PO TABS: ORAL_TABLET | ORAL | Status: DC

## 2013-12-16 NOTE — Assessment & Plan Note (Signed)
OFFICE VISIT  [name], [name] DOB:  1950/12/15                                        May 13, 2011 CHART #:  [id number]  CURRENT PROBLEMS: 1. Status post spontaneous type B aortic dissection of the descending     thoracic aorta February 2010, resolved. 2. Hypertension. 3. History of breast cancer treated at [facility]     with lumpectomy and chemoradiation under the direction of Dr.     Philipp Ovens (right breast).  PRESENT ILLNESS:  This patient is a 63 year old Caucasian female nonsmoker who returns for final visit with a CT scan to evaluate her thoracic aortic disease.  Two years ago, she had a type B dissection which was treated with conservative medical therapy and blood pressure control and has shown gradual resolution on serial CT scans.  Her last visit was May of last year at which time the false lumen of the descending thoracic aorta had resolved and the thoracic aorta had normal diameter.  She was hospitalized under the Cardiology Service in December this year for symptoms of CHF and was found to have nonischemic cardiomyopathy probably from her chemotherapy with an EF of 25% which had decreased from 60% from an echo done at [facility] approximately a year and a half previously.  She is currently taking medical therapy including Coreg, Diovan, Lasix, aspirin, and Zocor under the direction Dr. Angelena Form.  She denies any chest or back pain.  She states her blood pressure has been under good control.  She does remain obese.  PHYSICAL EXAMINATION:  VITAL SIGNS:  Blood pressure 130/80, pulse 80 and regular, saturation 99% on room air. GENERAL:  She is alert, pleasant, and appropriate. LUNGS:  Breath sounds are clear and equal. CARDIAC:  Rhythm is regular without gallop or murmur.  Peripheral pulses are all intact 2+. NEUROLOGIC:  Intact.  CT scan with contrast of the chest shows a normal thoracic aorta without evidence of hematoma,  false lumen, or any residual changes from the previous type B dissection.  The aorta is of normal diameter as well.  However, the left breast on the study is noted to have a 1 x 2 cm nodular density.  This was not mentioned on the last CT scan of the chest performed December 2011.  The patient is scheduled to see her oncologist Dr. Philipp Ovens at [facility] with a mammogram in September.  IMPRESSION AND PLAN:  The patient's thoracic aortic disease is now completely resolved and she is on the correct medications to prevent further recurrence.  She will follow up with her cardiologist and return here as needed.  Ivin Poot, M.D.   PV/MEDQ  D:  05/13/2011  T:  12/16/2013  Job:  [id number]  cc:   [name], MD Lauree Chandler, MD Gypsy Balsam, MD

## 2013-12-18 ENCOUNTER — Encounter: Payer: Self-pay | Admitting: *Deleted

## 2013-12-27 ENCOUNTER — Encounter: Payer: Self-pay | Admitting: Internal Medicine

## 2013-12-27 ENCOUNTER — Encounter (HOSPITAL_COMMUNITY): Payer: Self-pay

## 2013-12-27 ENCOUNTER — Ambulatory Visit (HOSPITAL_COMMUNITY)
Admission: RE | Admit: 2013-12-27 | Discharge: 2013-12-27 | Disposition: A | Discharge status: Home / Self Care | Payer: PRIVATE HEALTH INSURANCE | Source: Ambulatory Visit | Attending: Internal Medicine | Admitting: Internal Medicine

## 2013-12-27 ENCOUNTER — Telehealth: Payer: Self-pay | Admitting: Internal Medicine

## 2013-12-27 ENCOUNTER — Ambulatory Visit (INDEPENDENT_AMBULATORY_CARE_PROVIDER_SITE_OTHER): Payer: PRIVATE HEALTH INSURANCE | Admitting: Internal Medicine

## 2013-12-27 ENCOUNTER — Encounter (INDEPENDENT_AMBULATORY_CARE_PROVIDER_SITE_OTHER): Payer: Self-pay

## 2013-12-27 VITALS — BP 102/71 | HR 80 | Resp 16 | Wt 229.5 lb

## 2013-12-27 VITALS — BP 98/62 | HR 72 | Ht 61.0 in | Wt 229.8 lb

## 2013-12-27 DIAGNOSIS — Z95810 Presence of automatic (implantable) cardiac defibrillator: Secondary | ICD-10-CM | POA: Insufficient documentation

## 2013-12-27 DIAGNOSIS — I5022 Chronic systolic (congestive) heart failure: Secondary | ICD-10-CM

## 2013-12-27 DIAGNOSIS — I447 Left bundle-branch block, unspecified: Secondary | ICD-10-CM

## 2013-12-27 DIAGNOSIS — T82110A Breakdown (mechanical) of cardiac electrode, initial encounter: Secondary | ICD-10-CM

## 2013-12-27 DIAGNOSIS — I429 Cardiomyopathy, unspecified: Secondary | ICD-10-CM

## 2013-12-27 DIAGNOSIS — I7100 Dissection of unspecified site of aorta: Secondary | ICD-10-CM

## 2013-12-27 DIAGNOSIS — I4891 Unspecified atrial fibrillation: Secondary | ICD-10-CM

## 2013-12-27 DIAGNOSIS — I428 Other cardiomyopathies: Secondary | ICD-10-CM

## 2013-12-27 LAB — MDC_IDC_ENUM_SESS_TYPE_INCLINIC
Battery Remaining Longevity: 19.2 mo
Brady Statistic RA Percent Paced: 79 %
Brady Statistic RV Percent Paced: 95 %
Date Time Interrogation Session: 20150227172743
HighPow Impedance: 85.5 Ohm
Implantable Pulse Generator Serial Number: 7004282
Lead Channel Impedance Value: 375 Ohm
Lead Channel Impedance Value: 425 Ohm
Lead Channel Pacing Threshold Amplitude: 0.75 V
Lead Channel Pacing Threshold Amplitude: 1.25 V
Lead Channel Pacing Threshold Amplitude: 1.25 V
Lead Channel Pacing Threshold Pulse Width: 0.7 ms
Lead Channel Pacing Threshold Pulse Width: 0.7 ms
Lead Channel Pacing Threshold Pulse Width: 1 ms
Lead Channel Pacing Threshold Pulse Width: 1 ms
Lead Channel Sensing Intrinsic Amplitude: 1.1 mV
Lead Channel Sensing Intrinsic Amplitude: 11.9 mV
Lead Channel Setting Sensing Sensitivity: 0.5 mV
MDC IDC MSMT LEADCHNL LV IMPEDANCE VALUE: 312.5 Ohm
MDC IDC MSMT LEADCHNL LV PACING THRESHOLD AMPLITUDE: 1.75 V
MDC IDC MSMT LEADCHNL LV PACING THRESHOLD AMPLITUDE: 1.75 V
MDC IDC MSMT LEADCHNL RA PACING THRESHOLD AMPLITUDE: 0.75 V
MDC IDC MSMT LEADCHNL RA PACING THRESHOLD PULSEWIDTH: 0.5 ms
MDC IDC MSMT LEADCHNL RA PACING THRESHOLD PULSEWIDTH: 0.5 ms
MDC IDC SET LEADCHNL LV PACING AMPLITUDE: 3.5 V
MDC IDC SET LEADCHNL LV PACING PULSEWIDTH: 1 ms
MDC IDC SET LEADCHNL RA PACING AMPLITUDE: 2 V
MDC IDC SET LEADCHNL RV PACING AMPLITUDE: 2.5 V
MDC IDC SET LEADCHNL RV PACING PULSEWIDTH: 0.8 ms
Zone Setting Detection Interval: 250 ms

## 2013-12-27 MED ORDER — CYCLOBENZAPRINE HCL 10 MG PO TABS: 10.0000 mg | ORAL_TABLET | Freq: Three times a day (TID) | ORAL | Status: DC | PRN

## 2013-12-27 MED ORDER — SPIRONOLACTONE 25 MG PO TABS: 25.0000 mg | ORAL_TABLET | Freq: Every day | ORAL | Status: DC

## 2013-12-27 NOTE — Progress Notes (Signed)
Patient ID: [name], female   DOB: Mar 12, 1951, 63 y.o.   MRN: [id number]  Primary Cardiologist: Dr. Angelena Form PCP: Dr. Bea Graff Oncologist: Dr. Philipp Ovens at [facility] EP: Dr. [name]  Dr. Bea Graff in [place] Weight Range   220-224 pounds  Baseline proBNP     HPI:  [name] is a 63 y.o. female a cardiac history that includes HTN, Type B aortic dissection (2010) managed with medical therapy by Dr. Prescott Gum and has healed, LBBB, systolic heart failure due to NICM with EF 10-15% in May 2013 which has decreased from 65% in 2010.  Cath Dec 2011 showed normal arteries.  She was diagnosed with breast cancer in 2009 s/p lumpectomy, XRT, and chemotherapy.  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   She underwent St Jude Bi-V ICD implantation Sept 2012 but unable to place LV lead.  She eventually brought back in Nov 2012 for left thoracotomy and placement of epicardial LV lead placement by Dr. Prescott Gum.  She also had a sleep study in Jan that showed mild OSA.  No clinical intervention occurred.    10/10/2012 CPX Peak VO2: 10.6 % predicted peak VO2: 64.4% VE/VCO2 slope: 26.9 OUES: 1.18 Peak RER: 1.29 Ventilatory Threshold: 7.3 % predicted peak VO2: 44.4% Peak RR 23 Peak Ventilation: 29.8 VE/MVV: 32.6% PETCO2 at peak: 43  ECHO 01/02/13 EF 19% Grade 1 diastolic dysfunction. RV ok. No PAH ECHO 07/10/13 EF 20% RV ok  Labs 06/01/13 Creatinine 1.2 Potassium 4.4 Labs 12/14 K 3.9, creatinine 1.2  Since last appointment, she has been doing reasonably well.  Overall seems better since epicardial LV lead placed.  She is short of breath with fast walking.  She can climb a flight of stairs without too much trouble.  She can walk slowly with no problems.  No orthopnea.   Mild lightheadedness with standing.  No chest pain.  Weight is down 11 lbs since last appointment. 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   ROS: All systems  negative except as listed in HPI, PMH and Problem List.  Past Medical History  Diagnosis Date  . Nonischemic cardiomyopathy     Admission 12/11 with EF 25%, normal coronary arteries by cath 12/11; NICM presumed to be secondary to chemotherapy for breast cancer  . DVT (deep venous thrombosis) 10/18/2010  . Obesity   . 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 Glaucoma   . Depression   . Breast cancer 07/2009    Stage 3 lumpectomy, radiation, chemotherapy; finished chemo October, felt to be in remission  . History of shingles   . Hyperlipidemia   . Chronic systolic dysfunction of left ventricle   . Left bundle branch block   . Cardiac defibrillator in place     St. Jude (JA) 9/12; LV lead placement unsuccessful;  s/p left thoracotomy with placement of epicardial LV lead 09/2011 (Dr. PVT)  . [name]     with exertion  . Hypertension   . CHF (congestive heart failure)   . GERD (gastroesophageal reflux disease)   . Arthritis     lower back  . Hiatal hernia     small hiatal hernia  . Atrial fibrillation     post op (left thoracotomy) 11/12 req amio/DCCV    Current Outpatient Prescriptions  Medication Sig Dispense Refill  . albuterol (PROVENTIL HFA;VENTOLIN HFA) 108 (90 BASE) MCG/ACT inhaler Inhale 2 puffs into the  lungs every 4 (four) hours as needed. [name]       . albuterol (PROVENTIL) (2.5 MG/3ML) 0.083% nebulizer solution Take 2.5 mg by nebulization as needed. [name]       . allopurinol (ZYLOPRIM) 100 MG tablet Take 50 mg by mouth daily.      [name] ALPRAZolam (XANAX) 1 MG tablet Take 1 mg by mouth as needed. Anxiety       . aspirin 81 MG EC tablet Take 81 mg by mouth daily.        . Bepotastine Besilate (BEPREVE) 1.5 % SOLN Place 1 drop into both eyes as needed. Dry eyes       . bimatoprost (LUMIGAN) 0.03 % ophthalmic solution Place 1 drop into both eyes at bedtime.        . Calcium Carbonate-Vitamin D (CALCIUM-VITAMIN D3) 600-200 MG-UNIT TABS  Take 1 tablet by mouth 2 (two) times daily.        . carvedilol (COREG) 12.5 MG tablet Take 1 tablet (12.5 mg total) by mouth every morning. And take 1.5 tab (18.75 mg) in PM  75 tablet  3  . cholecalciferol (VITAMIN D) 1000 UNITS tablet Take 1,000 Units by mouth 2 (two) times daily.        . fexofenadine (ALLEGRA) 180 MG tablet Take 180 mg by mouth as needed. Allergies       . fluticasone (FLONASE) 50 MCG/ACT nasal spray Place 2 sprays into the nose as needed. Allergies       . furosemide (LASIX) 40 MG tablet Take 1 tablet (40 mg total) by mouth 2 (two) times daily.  60 tablet  3  . letrozole (FEMARA) 2.5 MG tablet Take 2.5 mg by mouth every evening.        . loratadine (CLARITIN) 10 MG tablet Take 10 mg by mouth daily as needed. For allergies       . montelukast (SINGULAIR) 10 MG tablet Take 10 mg by mouth as needed. For allergies & congestion      . Multiple Vitamin (MULTIVITAMIN) tablet Take 1 tablet by mouth daily.        . niacin (NIASPAN) 1000 MG CR tablet Take 500 mg by mouth at bedtime.       [name] 20 MG capsule Take 20 mg by mouth 2 (two) times daily.        [name] oxyCODONE-acetaminophen (PERCOCET) 5-325 MG per tablet Take 1 tablet by mouth every 4 (four) hours as needed.      [name] PARoxetine (PAXIL) 10 MG tablet Take 20 mg by mouth every morning.       [name] Glycol-Propyl Glycol (SYSTANE OP) Apply to eye as needed.      . potassium chloride SA (K-DUR,KLOR-CON) 20 MEQ tablet Take 2 tablets (40 mEq total) by mouth daily.      [name] senna-docusate (SENOKOT-S) 8.6-50 MG per tablet Take 1 tablet by mouth daily.        . simvastatin (ZOCOR) 20 MG tablet Take 20 mg by mouth at bedtime.        [name] spironolactone (ALDACTONE) 25 MG tablet Take 1 tablet (25 mg total) by mouth daily.  30 tablet  3  . traMADol (ULTRAM) 50 MG tablet Take 50 mg by mouth every 6 (six) hours as needed for pain.      . valsartan (DIOVAN) 40 MG tablet TAKE ONE TABLET TWICE DAILY  60 tablet  6  .  cyclobenzaprine (FLEXERIL) 10 MG tablet Take  1 tablet (10 mg total) by mouth 3 (three) times daily as needed for muscle spasms.  30 tablet  0  . [DISCONTINUED] ranitidine (ZANTAC) 300 MG capsule Take 300 mg by mouth daily as needed. Acid indigestion       No current facility-administered medications for this encounter.     PHYSICAL EXAM: Filed Vitals:   12/27/13 1046  BP: 102/71  Pulse: 80  Resp: 16  Weight: 229 lb 8 oz (104.101 kg)  SpO2: 96%    General:  Well appearing. No resp difficulty Daughter  present HEENT: normal Neck: supple. JVP difficult to assess due to body habitus but does not appear elevated. Carotids 2+ bilaterally; no bruits. No lymphadenopathy or thryomegaly appreciated. Cor: PMI normal. Regular rate & rhythm. No rubs, gallops or murmurs. Lungs: clear Abdomen: obese, soft, nontender, nondistended. No hepatosplenomegaly. No bruits or masses. Good bowel sounds. Extremities: no cyanosis, clubbing, rash,  edema Neuro: alert & orientedx3, cranial nerves grossly intact. Moves all 4 extremities w/o difficulty. Affect pleasant.  ASSESSMENT & PLAN: 1. Chronic Systolic Heart Failure: Nonischemic cardiomyopathy, EF 20% on last echo. S/p St Jude Bi-V ICD with epicardial LV lead. 10/10/2012 CPX with Peak VO2: 10.6% predicted, 64.4% VE/VCO2 slope. Discussed advanced therapies in the past.  BP remains on the low side with mild LH with standing occasionally. Overall NYHA class II-III.  Not volume overloaded.  - Continue current Coreg and valsartan.  - Continue Lasix 40 mg bid.  - Increase spironolactone to 25 mg daily.  Will get BMET/BNP in 2 wks.  - Will repeat CPX to see if there has been improvement with medical therapy and CRT.   2. H/o paroxysmal atrial fibrillation: Only 1 episode associated with stressor.  If recurrence is noted, will need anticoagulation.  3. Type B aortic dissection: Medical therapy, followed by Dr. Prescott Gum.   Mahmud Keithly  [place] 12/27/2013

## 2013-12-27 NOTE — Patient Instructions (Signed)
Your physician wants you to follow-up in: 12 months with [name] You will receive a reminder letter in the mail two months in advance. If you don't receive a letter, please call our office to schedule the follow-up appointment.    Remote monitoring is used to monitor your Pacemaker or ICD from home. This monitoring reduces the number of office visits required to check your device to one time per year. It allows [place] to keep an eye on the functioning of your device to ensure it is working properly. You are scheduled for a device check from home on 03/31/14. You may send your transmission at any time that day. If you have a wireless device, the transmission will be sent automatically. After your physician reviews your transmission, you will receive a postcard with your next transmission date.  A chest x-ray takes a picture of the organs and structures inside the chest, including the heart, lungs, and blood vessels. This test can show several things, including, whether the heart is enlarges; whether fluid is building up in the lungs; and whether pacemaker / defibrillator leads are still in place.

## 2013-12-27 NOTE — Patient Instructions (Addendum)
Increase spironalactone to 25mg  once daily.  Have labs drawn in 2 weeks-Remember to bring your handwritten prescription from [facility] to have them drawn.  Cardiopulmonary stress test 01/27/14 @ 8:30 am Your physician has recommended that you have a cardiopulmonary stress test (CPX). CPX testing is a non-invasive measurement of heart and lung function. It replaces a traditional treadmill stress test. This type of test provides a tremendous amount of information that relates not only to your present condition but also for future outcomes. This test combines measurements of you ventilation, respiratory gas exchange in the lungs, electrocardiogram (EKG), blood pressure and physical response before, during, and following an exercise protocol.  Follow up with Advanced [facility] Failure clinic in 3 months-we will call you to make that appointment closer to time.  Please don't hesitate to call us with any questions/concerns!

## 2013-12-30 NOTE — Progress Notes (Signed)
PCP:  Brittney Rile, MD Primary Cardiologist:  Dr Angelena Form, Also followed by the advanced heart failure clinic  The patient presents today for routine electrophysiology followup.  Since last being seen in our clinic, the patient reports doing well.    Today, she denies symptoms of palpitations, chest pain, dizziness, presyncope, syncope, or neurologic sequela.  She has stable intermittent edema.  The patient feels that she is tolerating medications without difficulties and is otherwise without complaint today.   Past Medical History  Diagnosis Date  . Nonischemic cardiomyopathy     Admission 12/11 with EF 25%, normal coronary arteries by cath 12/11; NICM presumed to be secondary to chemotherapy for breast cancer  . DVT (deep venous thrombosis) 10/18/2010  . Obesity   . 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 Glaucoma   . Depression   . Breast cancer 07/2009    Stage 3 lumpectomy, radiation, chemotherapy; finished chemo October, felt to be in remission  . History of shingles   . Hyperlipidemia   . Chronic systolic dysfunction of left ventricle   . Left bundle branch block   . Cardiac defibrillator in place     St. Jude (JA) 9/12; LV lead placement unsuccessful;  s/p left thoracotomy with placement of epicardial LV lead 09/2011 (Dr. PVT)  . [name]     with exertion  . Hypertension   . CHF (congestive heart failure)   . GERD (gastroesophageal reflux disease)   . Arthritis     lower back  . Hiatal hernia     small hiatal hernia  . Atrial fibrillation     post op (left thoracotomy) 11/12 req amio/DCCV   Past Surgical History  Procedure Laterality Date  . Tubal ligation      Bilateral  . Oophorectomy      Single  . Breast lumpectomy      Right breast  . Tonsillectomy    . Cardiac defibrillator placement    . Cardiac catheterization      2011  . Thoracotomy  09/16/2011    Procedure: THORACOTOMY MAJOR;  Surgeon: Tharon Aquas Adelene Idler, MD;  Location: [facility] OR;  Service:  Thoracic;  Laterality: Left;  left anterolateral Thoracotomy for placement of St. Jude epicardial pacing lead     Current Outpatient Prescriptions  Medication Sig Dispense Refill  . albuterol (PROVENTIL HFA;VENTOLIN HFA) 108 (90 BASE) MCG/ACT inhaler Inhale 2 puffs into the lungs every 4 (four) hours as needed. [name]       . albuterol (PROVENTIL) (2.5 MG/3ML) 0.083% nebulizer solution Take 2.5 mg by nebulization as needed. [name]       . allopurinol (ZYLOPRIM) 100 MG tablet Take 50 mg by mouth daily.      [name] ALPRAZolam (XANAX) 1 MG tablet Take 1 mg by mouth as needed. Anxiety       . aspirin 81 MG EC tablet Take 81 mg by mouth daily.        . Bepotastine Besilate (BEPREVE) 1.5 % SOLN Place 1 drop into both eyes as needed. Dry eyes       . bimatoprost (LUMIGAN) 0.03 % ophthalmic solution Place 1 drop into both eyes at bedtime.        . Calcium Carbonate-Vitamin D (CALCIUM-VITAMIN D3) 600-200 MG-UNIT TABS Take 1 tablet by mouth 2 (two) times daily.        . carvedilol (COREG) 12.5 MG tablet Take 1 tablet (12.5 mg total) by mouth every morning. And  take 1.5 tab (18.75 mg) in PM  75 tablet  3  . cholecalciferol (VITAMIN D) 1000 UNITS tablet Take 1,000 Units by mouth 2 (two) times daily.        . cyclobenzaprine (FLEXERIL) 10 MG tablet Take 1 tablet (10 mg total) by mouth 3 (three) times daily as needed for muscle spasms.  30 tablet  0  . fexofenadine (ALLEGRA) 180 MG tablet Take 180 mg by mouth as needed. Allergies       . fluticasone (FLONASE) 50 MCG/ACT nasal spray Place 2 sprays into the nose as needed. Allergies       . furosemide (LASIX) 40 MG tablet Take 1 tablet (40 mg total) by mouth 2 (two) times daily.  60 tablet  3  . letrozole (FEMARA) 2.5 MG tablet Take 2.5 mg by mouth every evening.        . loratadine (CLARITIN) 10 MG tablet Take 10 mg by mouth daily as needed. For allergies       . montelukast (SINGULAIR) 10 MG tablet Take 10 mg by mouth as needed. For  allergies & congestion      . Multiple Vitamin (MULTIVITAMIN) tablet Take 1 tablet by mouth daily.        . niacin (NIASPAN) 1000 MG CR tablet Take 500 mg by mouth at bedtime.       [name] 20 MG capsule Take 20 mg by mouth 2 (two) times daily.        [name] oxyCODONE-acetaminophen (PERCOCET) 5-325 MG per tablet Take 1 tablet by mouth every 4 (four) hours as needed.      [name] PARoxetine (PAXIL) 10 MG tablet Take 20 mg by mouth every morning.       [name] Glycol-Propyl Glycol (SYSTANE OP) Apply to eye as needed.      . potassium chloride SA (K-DUR,KLOR-CON) 20 MEQ tablet Take 2 tablets (40 mEq total) by mouth daily.      [name] senna-docusate (SENOKOT-S) 8.6-50 MG per tablet Take 1 tablet by mouth daily.        . simvastatin (ZOCOR) 20 MG tablet Take 20 mg by mouth at bedtime.        [name] spironolactone (ALDACTONE) 25 MG tablet Take 1 tablet (25 mg total) by mouth daily.  30 tablet  3  . traMADol (ULTRAM) 50 MG tablet Take 50 mg by mouth every 6 (six) hours as needed for pain.      . valsartan (DIOVAN) 40 MG tablet TAKE ONE TABLET TWICE DAILY  60 tablet  6  . [DISCONTINUED] ranitidine (ZANTAC) 300 MG capsule Take 300 mg by mouth daily as needed. Acid indigestion       No current facility-administered medications for this visit.    Allergies  Allergen Reactions  . Tape     Blister from clear tapes.    History   Social History  . Marital Status: Married    Spouse Name: N/A    Number of Children: N/A  . Years of Education: N/A   Occupational History  . [employer]  . Retired from Science writer    Social History Main Topics  . Smoking status: Never Smoker   . Smokeless tobacco: Never Used  . Alcohol Use: No  . Drug Use: No  . Sexual Activity: Not on file   Other Topics Concern  . Not on file   Social History Narrative   Married   One child   Lives in [place] with spouse  and mother in law   Previously worked as a Art gallery manager for [employer]       She is a Sales promotion account executive.    Family History  Problem Relation Age of Onset  . Hypertension Mother   . Hypertension Father   . Hypertension Brother   . Hypertension Maternal Grandfather   . Heart attack Maternal Grandfather     MI  . Coronary artery disease Maternal Grandfather   . Hypertension Paternal Grandfather   . Heart attack Paternal Grandfather     MI  . Coronary artery disease Paternal Grandfather   . Hypertension Brother     Physical Exam: Filed Vitals:   12/27/13 1222  BP: 98/62  Pulse: 72  Height: 5\' 1"  (1.549 m)  Weight: 229 lb 12.8 oz (104.237 kg)    GEN- The patient is well appearing, alert and oriented x 3 today.   Head- normocephalic, atraumatic Eyes-  Sclera clear, conjunctiva pink Ears- hearing intact Oropharynx- clear Neck- supple  Lungs- Clear to ausculation bilaterally, normal work of breathing Chest- ICD pocket is well healed Heart- Regular rate and rhythm, no murmurs, rubs or gallops, PMI not laterally displaced GI- soft, NT, ND, + BS Extremities- no clubbing, cyanosis, or edema  ICD interrogation- reviewed in detail today,  See PACEART report  CXR is reviewed today and reveals stable lead position  Assessment and Plan:  1. Chronic systolic dysfunction/ LBBB/ nonischemic CM Normal BiV ICD function See Pace Art report'RV threshold is slightly increased.  CXR reveals stable lead position.  This is likely scarring at the lead tip.  Output increased.  No further workup planned. No changes today  Merlin Return to see Jerene Pitch in 1 year

## 2014-01-17 ENCOUNTER — Other Ambulatory Visit (HOSPITAL_COMMUNITY): Payer: Self-pay | Admitting: Internal Medicine

## 2014-01-18 LAB — BASIC METABOLIC PANEL
BUN / CREAT RATIO: 13 (ref 11–26)
BUN: 15 mg/dL (ref 8–27)
CALCIUM: 9.1 mg/dL (ref 8.7–10.3)
CO2: 22 mmol/L (ref 18–29)
CREATININE: 1.15 mg/dL — AB (ref 0.57–1.00)
Chloride: 102 mmol/L (ref 97–108)
GFR calc Af Amer: 59 mL/min/{1.73_m2} — ABNORMAL LOW (ref >59)
GFR, EST NON AFRICAN AMERICAN: 51 mL/min/{1.73_m2} — AB (ref >59)
Glucose: 96 mg/dL (ref 65–99)
Potassium: 4.5 mmol/L (ref 3.5–5.2)
SODIUM: 141 mmol/L (ref 134–144)

## 2014-01-19 LAB — BRAIN NATRIURETIC PEPTIDE: BNP: 140.7 pg/mL — ABNORMAL HIGH (ref 0.0–100.0)

## 2014-01-27 ENCOUNTER — Encounter (HOSPITAL_COMMUNITY): Payer: PRIVATE HEALTH INSURANCE

## 2014-02-10 ENCOUNTER — Ambulatory Visit (HOSPITAL_COMMUNITY): Payer: PRIVATE HEALTH INSURANCE | Attending: Cardiology

## 2014-02-10 DIAGNOSIS — I5022 Chronic systolic (congestive) heart failure: Secondary | ICD-10-CM | POA: Insufficient documentation

## 2014-02-18 ENCOUNTER — Telehealth (HOSPITAL_COMMUNITY): Payer: Self-pay | Admitting: Cardiology

## 2014-02-18 NOTE — Telephone Encounter (Signed)
Message copied by Cristabel Bicknell, Sharlot Gowda on Tue Feb 18, 2014  8:44 AM ------      Message from: Larey Dresser      Created: Sun Feb 16, 2014 11:46 PM       Moderate functional limitation probably mainly related to body habitus. No significant cardiac limitation. ------

## 2014-02-18 NOTE — Telephone Encounter (Signed)
Left message with female for pt to return call

## 2014-03-07 ENCOUNTER — Encounter (HOSPITAL_COMMUNITY): Payer: PRIVATE HEALTH INSURANCE

## 2014-03-09 NOTE — Progress Notes (Signed)
Patient ID: [name], female   DOB: 1951/01/05, 63 y.o.   MRN: [id number]  Primary Cardiologist: Dr. Angelena Form PCP: Dr. Bea Graff Oncologist: Dr. Philipp Ovens at [facility] EP: Dr. [name]  Dr. Bea Graff in [place] Weight Range   220-224 pounds  Baseline proBNP     HPI: [name] is a 63 y.o. female a cardiac history that includes HTN, Type B aortic dissection (2010) managed with medical therapy by Dr. Prescott Gum and has healed, LBBB, systolic heart failure due to NICM with EF 10-15% in May 2013 which has decreased from 65% in 2010.  Cath Dec 2011 showed normal arteries.  She was diagnosed with breast cancer in 2009 s/p lumpectomy, XRT, and chemotherapy.  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   She underwent St Jude Bi-V ICD implantation Sept 2012 but unable to place LV lead.  She eventually brought back in Nov 2012 for left thoracotomy and placement of epicardial LV lead placement by Dr. Prescott Gum.  She also had a sleep study in Jan that showed mild OSA.  No clinical intervention occurred.    She returns for follow up. Overall she is feeling the best she has felt in months. Ongoing back and L knee pain but much betters after knee injection. Denies SOB/PND/Orthopnea. Able to walk up steps. Weight at home 238 pounds. Plans to start water aerobics this summer.  Complaint with medications.  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   10/10/2012 CPX Peak VO2: 10.6 % predicted peak VO2: 64.4% VE/VCO2 slope: 26.9 OUES: 1.18 Peak RER: 1.29 Ventilatory Threshold: 7.3 % predicted peak VO2: 44.4% Peak RR 23 Peak Ventilation: 29.8 VE/MVV: 32.6% PETCO2 at peak: 43  01/2014 CPX Peak VO2: 10.3 (66.4% predicted peak VO2) VE/VCO2 slope: 25.0 OUES: 1.30 Peak RER: 1.20  ECHO 01/02/13 EF 40% Grade 1 diastolic dysfunction. RV ok. No PAH ECHO 07/10/13 EF 20% RV ok  Labs 06/01/13 Creatinine 1.2 Potassium 4.4 Labs 12/14 K 3.9,  creatinine 1.2 Labs 01/17/14 K 4.5 Creatinine 1.15    ROS: All systems negative except as listed in HPI, PMH and Problem List.  Past Medical History  Diagnosis Date  . Nonischemic cardiomyopathy     Admission 12/11 with EF 25%, normal coronary arteries by cath 12/11; NICM presumed to be secondary to chemotherapy for breast cancer  . DVT (deep venous thrombosis) 10/18/2010  . Obesity   . 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 Glaucoma   . Depression   . Breast cancer 07/2009    Stage 3 lumpectomy, radiation, chemotherapy; finished chemo October, felt to be in remission  . History of shingles   . Hyperlipidemia   . Chronic systolic dysfunction of left ventricle   . Left bundle branch block   . Cardiac defibrillator in place     St. Jude (JA) 9/12; LV lead placement unsuccessful;  s/p left thoracotomy with placement of epicardial LV lead 09/2011 (Dr. PVT)  . [name]     with exertion  . Hypertension   . CHF (congestive heart failure)   . GERD (gastroesophageal reflux disease)   . Arthritis     lower back  . Hiatal hernia     small hiatal hernia  . Atrial fibrillation     post op (left thoracotomy) 11/12 req amio/DCCV    Current Outpatient Prescriptions  Medication Sig Dispense Refill  . albuterol (PROVENTIL HFA;VENTOLIN HFA) 108 (90  BASE) MCG/ACT inhaler Inhale 2 puffs into the lungs every 4 (four) hours as needed. [name]       . albuterol (PROVENTIL) (2.5 MG/3ML) 0.083% nebulizer solution Take 2.5 mg by nebulization as needed. [name]       . allopurinol (ZYLOPRIM) 100 MG tablet Take 50 mg by mouth daily.      [name] ALPRAZolam (XANAX) 1 MG tablet Take 1 mg by mouth as needed. Anxiety       . aspirin 81 MG EC tablet Take 81 mg by mouth daily.        . Bepotastine Besilate (BEPREVE) 1.5 % SOLN Place 1 drop into both eyes as needed. Dry eyes       . bimatoprost (LUMIGAN) 0.03 % ophthalmic solution Place 1 drop into both eyes at bedtime.         . Calcium Carbonate-Vitamin D (CALCIUM-VITAMIN D3) 600-200 MG-UNIT TABS Take 1 tablet by mouth 2 (two) times daily.        . carvedilol (COREG) 12.5 MG tablet Take 1 tablet (12.5 mg total) by mouth every morning. And take 1.5 tab (18.75 mg) in PM  75 tablet  3  . cholecalciferol (VITAMIN D) 1000 UNITS tablet Take 1,000 Units by mouth 2 (two) times daily.        . cyclobenzaprine (FLEXERIL) 10 MG tablet Take 1 tablet (10 mg total) by mouth 3 (three) times daily as needed for muscle spasms.  30 tablet  0  . fexofenadine (ALLEGRA) 180 MG tablet Take 180 mg by mouth as needed. Allergies       . fluticasone (FLONASE) 50 MCG/ACT nasal spray Place 2 sprays into the nose as needed. Allergies       . furosemide (LASIX) 40 MG tablet Take 1 tablet (40 mg total) by mouth 2 (two) times daily.  60 tablet  3  . letrozole (FEMARA) 2.5 MG tablet Take 2.5 mg by mouth every evening.        . loratadine (CLARITIN) 10 MG tablet Take 10 mg by mouth daily as needed. For allergies       . montelukast (SINGULAIR) 10 MG tablet Take 10 mg by mouth as needed. For allergies & congestion      . Multiple Vitamin (MULTIVITAMIN) tablet Take 1 tablet by mouth daily.        . niacin (NIASPAN) 1000 MG CR tablet Take 500 mg by mouth at bedtime.       [name] 20 MG capsule Take 20 mg by mouth 2 (two) times daily.        [name] PARoxetine (PAXIL) 10 MG tablet Take 20 mg by mouth every morning.       [name] Glycol-Propyl Glycol (SYSTANE OP) Apply to eye as needed.      . potassium chloride SA (K-DUR,KLOR-CON) 20 MEQ tablet Take 2 tablets (40 mEq total) by mouth daily.      [name] senna-docusate (SENOKOT-S) 8.6-50 MG per tablet Take 1 tablet by mouth daily.        . simvastatin (ZOCOR) 20 MG tablet Take 20 mg by mouth at bedtime.        [name] spironolactone (ALDACTONE) 25 MG tablet Take 1 tablet (25 mg total) by mouth daily.  30 tablet  3  . traMADol (ULTRAM) 50 MG tablet Take 50 mg by mouth every 6 (six) hours as needed for  pain.      . valsartan (DIOVAN) 40 MG tablet TAKE ONE TABLET TWICE  DAILY  60 tablet  6  . [DISCONTINUED] ranitidine (ZANTAC) 300 MG capsule Take 300 mg by mouth daily as needed. Acid indigestion       No current facility-administered medications for this encounter.     PHYSICAL EXAM: Filed Vitals:   03/10/14 0926  BP: 103/67  Pulse: 79  Weight: 246 lb (111.585 kg)  SpO2: 97%    General:  Well appearing. No resp difficulty Husband present HEENT: normal Neck: supple. JVP difficult to assess due to body habitus but does not appear elevated. Carotids 2+ bilaterally; no bruits. No lymphadenopathy or thryomegaly appreciated. Cor: PMI normal. Regular rate & rhythm. No rubs, gallops or murmurs. Lungs: clear Abdomen: obese, soft, nontender, nondistended. No hepatosplenomegaly. No bruits or masses. Good bowel sounds. Extremities: no cyanosis, clubbing, rash,  Edema  Neuro: alert & orientedx3, cranial nerves grossly intact. Moves all 4 extremities w/o difficulty. Affect pleasant.  ASSESSMENT & PLAN: 1. Chronic Systolic Heart Failure: Nonischemic cardiomyopathy, EF 20% on last echo. S/p St Jude Bi-V ICD with epicardial LV lead. 10/10/2012 CPX with Peak VO2: 10.6% predicted, 64.4% VE/VCO2 slope. Repeat CPX in April was unchanged with major limitations from body habitus not cardiac. 01/2014 CPX---> Peak VO2: 10.3 (66.4% predicted peak VO2)VE/VCO2 slope: 25.0  Overall NYHA class II-III.  Volume status stable despite weight gain. Continue Lasix 40 mg bid and 25 mg spironolactone daily.  - Continue current Coreg and valsartan.  Encouraged to start exercising.   2. H/o paroxysmal atrial fibrillation: Only 1 episode associated with stressor.  If recurrence is noted, will need anticoagulation.     Follow up 3 months.  Amy D Clegg NP-C  03/10/2014

## 2014-03-10 ENCOUNTER — Ambulatory Visit (HOSPITAL_COMMUNITY)
Admission: RE | Admit: 2014-03-10 | Discharge: 2014-03-10 | Disposition: A | Discharge status: Home / Self Care | Payer: PRIVATE HEALTH INSURANCE | Source: Ambulatory Visit | Attending: Internal Medicine | Admitting: Internal Medicine

## 2014-03-10 VITALS — BP 103/67 | HR 79 | Wt 246.0 lb

## 2014-03-10 DIAGNOSIS — I5022 Chronic systolic (congestive) heart failure: Secondary | ICD-10-CM | POA: Insufficient documentation

## 2014-03-10 NOTE — Patient Instructions (Addendum)
Follow up 3-4 months.  Do the following things EVERYDAY: 1) Weigh yourself in the morning before breakfast. Write it down and keep it in a log. 2) Take your medicines as prescribed 3) Eat low salt foods-Limit salt (sodium) to 2000 mg per day.  4) Stay as active as you can everyday 5) Limit all fluids for the day to less than 2 liters  

## 2014-03-31 ENCOUNTER — Encounter: Payer: Self-pay | Admitting: Internal Medicine

## 2014-03-31 ENCOUNTER — Ambulatory Visit (INDEPENDENT_AMBULATORY_CARE_PROVIDER_SITE_OTHER): Payer: PRIVATE HEALTH INSURANCE | Admitting: *Deleted

## 2014-03-31 DIAGNOSIS — I428 Other cardiomyopathies: Secondary | ICD-10-CM

## 2014-03-31 DIAGNOSIS — I5022 Chronic systolic (congestive) heart failure: Secondary | ICD-10-CM

## 2014-03-31 LAB — MDC_IDC_ENUM_SESS_TYPE_REMOTE
Battery Remaining Longevity: 16 mo
Battery Remaining Percentage: 40 %
Battery Voltage: 2.87 V
Brady Statistic AP VP Percent: 65 %
Brady Statistic AP VS Percent: 3.2 %
Brady Statistic AS VP Percent: 30 %
Brady Statistic RA Percent Paced: 67 %
HIGH POWER IMPEDANCE MEASURED VALUE: 105 Ohm
HighPow Impedance: 105 Ohm
Implantable Pulse Generator Serial Number: 7004282
Lead Channel Impedance Value: 290 Ohm
Lead Channel Impedance Value: 360 Ohm
Lead Channel Impedance Value: 410 Ohm
Lead Channel Pacing Threshold Amplitude: 1.25 V
Lead Channel Pacing Threshold Amplitude: 1.75 V
Lead Channel Pacing Threshold Pulse Width: 0.5 ms
Lead Channel Pacing Threshold Pulse Width: 0.7 ms
Lead Channel Sensing Intrinsic Amplitude: 12 mV
Lead Channel Setting Pacing Amplitude: 2 V
Lead Channel Setting Pacing Amplitude: 3.5 V
Lead Channel Setting Pacing Pulse Width: 0.8 ms
Lead Channel Setting Pacing Pulse Width: 1 ms
Lead Channel Setting Sensing Sensitivity: 0.5 mV
MDC IDC MSMT LEADCHNL LV PACING THRESHOLD PULSEWIDTH: 1 ms
MDC IDC MSMT LEADCHNL RA PACING THRESHOLD AMPLITUDE: 0.75 V
MDC IDC MSMT LEADCHNL RA SENSING INTR AMPL: 0.3 mV
MDC IDC SESS DTM: 20150601060030
MDC IDC SET LEADCHNL RV PACING AMPLITUDE: 2.5 V
MDC IDC SET ZONE DETECTION INTERVAL: 250 ms
MDC IDC STAT BRADY AS VS PERCENT: 1.4 %

## 2014-03-31 NOTE — Progress Notes (Signed)
Remote ICD transmission.   

## 2014-04-18 ENCOUNTER — Encounter: Payer: Self-pay | Admitting: Cardiology

## 2014-05-02 ENCOUNTER — Other Ambulatory Visit (HOSPITAL_COMMUNITY): Payer: Self-pay | Admitting: Internal Medicine

## 2014-05-02 ENCOUNTER — Other Ambulatory Visit (HOSPITAL_COMMUNITY): Payer: Self-pay | Admitting: Cardiology

## 2014-07-02 ENCOUNTER — Ambulatory Visit (INDEPENDENT_AMBULATORY_CARE_PROVIDER_SITE_OTHER): Payer: PRIVATE HEALTH INSURANCE | Admitting: *Deleted

## 2014-07-02 DIAGNOSIS — I5022 Chronic systolic (congestive) heart failure: Secondary | ICD-10-CM

## 2014-07-02 DIAGNOSIS — I428 Other cardiomyopathies: Secondary | ICD-10-CM

## 2014-07-02 NOTE — Progress Notes (Signed)
Remote ICD transmission.   

## 2014-07-03 ENCOUNTER — Other Ambulatory Visit (HOSPITAL_COMMUNITY): Payer: Self-pay | Admitting: Internal Medicine

## 2014-07-03 ENCOUNTER — Other Ambulatory Visit: Payer: Self-pay | Admitting: Cardiovascular Disease

## 2014-07-03 NOTE — Telephone Encounter (Signed)
error 

## 2014-07-04 ENCOUNTER — Other Ambulatory Visit (HOSPITAL_COMMUNITY): Payer: Self-pay | Admitting: Internal Medicine

## 2014-07-04 LAB — MDC_IDC_ENUM_SESS_TYPE_REMOTE
Battery Remaining Longevity: 13 mo
Battery Remaining Percentage: 35 %
Brady Statistic AP VS Percent: 2 %
Brady Statistic AS VS Percent: 1 %
Brady Statistic RA Percent Paced: 75 %
Date Time Interrogation Session: 20150902080016
HighPow Impedance: 99 Ohm
HighPow Impedance: 99 Ohm
Implantable Pulse Generator Serial Number: 7004282
Lead Channel Impedance Value: 290 Ohm
Lead Channel Impedance Value: 360 Ohm
Lead Channel Pacing Threshold Amplitude: 0.75 V
Lead Channel Pacing Threshold Pulse Width: 0.5 ms
Lead Channel Sensing Intrinsic Amplitude: 0.3 mV
Lead Channel Sensing Intrinsic Amplitude: 12 mV
Lead Channel Setting Pacing Amplitude: 2 V
Lead Channel Setting Pacing Amplitude: 2.5 V
Lead Channel Setting Sensing Sensitivity: 0.5 mV
MDC IDC MSMT BATTERY VOLTAGE: 2.86 V
MDC IDC MSMT LEADCHNL LV PACING THRESHOLD AMPLITUDE: 1.75 V
MDC IDC MSMT LEADCHNL LV PACING THRESHOLD PULSEWIDTH: 1 ms
MDC IDC MSMT LEADCHNL RA IMPEDANCE VALUE: 400 Ohm
MDC IDC MSMT LEADCHNL RV PACING THRESHOLD AMPLITUDE: 1.25 V
MDC IDC MSMT LEADCHNL RV PACING THRESHOLD PULSEWIDTH: 0.7 ms
MDC IDC SET LEADCHNL LV PACING AMPLITUDE: 3.5 V
MDC IDC SET LEADCHNL LV PACING PULSEWIDTH: 1 ms
MDC IDC SET LEADCHNL RV PACING PULSEWIDTH: 0.8 ms
MDC IDC STAT BRADY AP VP PERCENT: 73 %
MDC IDC STAT BRADY AS VP PERCENT: 24 %
Zone Setting Detection Interval: 250 ms

## 2014-07-10 ENCOUNTER — Encounter: Payer: Self-pay | Admitting: Cardiology

## 2014-07-16 ENCOUNTER — Encounter (HOSPITAL_COMMUNITY): Payer: PRIVATE HEALTH INSURANCE

## 2014-07-16 ENCOUNTER — Ambulatory Visit (HOSPITAL_COMMUNITY)
Admission: RE | Admit: 2014-07-16 | Discharge: 2014-07-16 | Disposition: A | Discharge status: Home / Self Care | Payer: PRIVATE HEALTH INSURANCE | Source: Ambulatory Visit | Attending: Internal Medicine | Admitting: Internal Medicine

## 2014-07-16 ENCOUNTER — Encounter (HOSPITAL_COMMUNITY): Payer: Self-pay

## 2014-07-16 VITALS — BP 109/61 | HR 84 | Resp 18 | Wt 247.1 lb

## 2014-07-16 DIAGNOSIS — I428 Other cardiomyopathies: Secondary | ICD-10-CM

## 2014-07-16 DIAGNOSIS — I480 Paroxysmal atrial fibrillation: Secondary | ICD-10-CM

## 2014-07-16 DIAGNOSIS — Z6841 Body Mass Index (BMI) 40.0 and over, adult: Secondary | ICD-10-CM | POA: Insufficient documentation

## 2014-07-16 DIAGNOSIS — Z7982 Long term (current) use of aspirin: Secondary | ICD-10-CM | POA: Insufficient documentation

## 2014-07-16 DIAGNOSIS — E669 Obesity, unspecified: Secondary | ICD-10-CM | POA: Insufficient documentation

## 2014-07-16 DIAGNOSIS — I4891 Unspecified atrial fibrillation: Secondary | ICD-10-CM | POA: Insufficient documentation

## 2014-07-16 DIAGNOSIS — I5022 Chronic systolic (congestive) heart failure: Secondary | ICD-10-CM | POA: Diagnosis not present

## 2014-07-16 DIAGNOSIS — I509 Heart failure, unspecified: Secondary | ICD-10-CM | POA: Insufficient documentation

## 2014-07-16 NOTE — Progress Notes (Signed)
Patient ID: [name], female   DOB: June 28, 1951, 63 y.o.   MRN: [id number]  Primary Cardiologist: Dr. Angelena Form PCP: Dr. Bea Graff Oncologist: Dr. Philipp Ovens at [facility] EP: Dr. [name]  Dr. Bea Graff in [place] Weight Range   220-224 pounds  Baseline proBNP     HPI: [name] is a 63 y.o. female a cardiac history that includes HTN, Type B aortic dissection (2010) managed with medical therapy by Dr. Prescott Gum and has healed, LBBB, systolic heart failure due to NICM with EF 10-15% in May 2013 which has decreased from 65% in 2010.  Cath Dec 2011 showed normal arteries.  She was diagnosed with breast cancer in 2009 s/p lumpectomy, XRT, and chemotherapy.  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   She underwent St Jude Bi-V ICD implantation Sept 2012 but unable to place LV lead.  She eventually brought back in Nov 2012 for left thoracotomy and placement of epicardial LV lead placement by Dr. Prescott Gum.  She also had a sleep study in Jan that showed mild OSA.  No clinical intervention occurred.    She returns for follow up. Last visit she was stable with no medications changes. Overall doing great. Denies SOB/PND/Orthopnea. Has ongoing L knee pain. Weight at at home 238 pounds. Not exercising. Taking all medications.  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   10/10/2012 CPX Peak VO2: 10.6 % predicted peak VO2: 64.4% VE/VCO2 slope: 26.9 OUES: 1.18 Peak RER: 1.29 Ventilatory Threshold: 7.3 % predicted peak VO2: 44.4% Peak RR 23 Peak Ventilation: 29.8 VE/MVV: 32.6% PETCO2 at peak: 43  01/2014 CPX Peak VO2: 10.3 (66.4% predicted peak VO2) VE/VCO2 slope: 25.0 OUES: 1.30 Peak RER: 1.20  ECHO 01/02/13 EF 01% Grade 1 diastolic dysfunction. RV ok. No PAH ECHO 07/10/13 EF 20% RV ok  Labs 06/01/13 Creatinine 1.2 Potassium 4.4 Labs 12/14 K 3.9, creatinine 1.2 Labs 01/17/14 K 4.5 Creatinine 1.15    ROS: All systems  negative except as listed in HPI, PMH and Problem List.  Past Medical History  Diagnosis Date  . Nonischemic cardiomyopathy     Admission 12/11 with EF 25%, normal coronary arteries by cath 12/11; NICM presumed to be secondary to chemotherapy for breast cancer  . DVT (deep venous thrombosis) 10/18/2010  . Obesity   . 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 Glaucoma   . Depression   . Breast cancer 07/2009    Stage 3 lumpectomy, radiation, chemotherapy; finished chemo October, felt to be in remission  . History of shingles   . Hyperlipidemia   . Chronic systolic dysfunction of left ventricle   . Left bundle branch block   . Cardiac defibrillator in place     St. Jude (JA) 9/12; LV lead placement unsuccessful;  s/p left thoracotomy with placement of epicardial LV lead 09/2011 (Dr. PVT)  . [name]     with exertion  . Hypertension   . CHF (congestive heart failure)   . GERD (gastroesophageal reflux disease)   . Arthritis     lower back  . Hiatal hernia     small hiatal hernia  . Atrial fibrillation     post op (left thoracotomy) 11/12 req amio/DCCV    Current Outpatient Prescriptions  Medication Sig Dispense Refill  . albuterol (PROVENTIL HFA;VENTOLIN HFA) 108 (90 BASE) MCG/ACT inhaler Inhale 2 puffs into the lungs every 4 (four) hours as needed. Shortness  of breath       . albuterol (PROVENTIL) (2.5 MG/3ML) 0.083% nebulizer solution Take 2.5 mg by nebulization as needed. [name]       . allopurinol (ZYLOPRIM) 100 MG tablet Take 50 mg by mouth daily.      [name] ALPRAZolam (XANAX) 1 MG tablet Take 1 mg by mouth as needed. Anxiety       . aspirin 81 MG EC tablet Take 81 mg by mouth daily.        . Bepotastine Besilate (BEPREVE) 1.5 % SOLN Place 1 drop into both eyes as needed. Dry eyes       . bimatoprost (LUMIGAN) 0.03 % ophthalmic solution Place 1 drop into both eyes at bedtime.        . Calcium Carbonate-Vitamin D (CALCIUM-VITAMIN D3) 600-200 MG-UNIT TABS  Take 1 tablet by mouth 2 (two) times daily.        . carvedilol (COREG) 12.5 MG tablet Take 1 tablet (12.5 mg total) by mouth every morning. And take 1.5 tab (18.75 mg) in PM  75 tablet  3  . cholecalciferol (VITAMIN D) 1000 UNITS tablet Take 1,000 Units by mouth 2 (two) times daily.        . cyclobenzaprine (FLEXERIL) 10 MG tablet Take 1 tablet (10 mg total) by mouth 3 (three) times daily as needed for muscle spasms.  30 tablet  0  . fexofenadine (ALLEGRA) 180 MG tablet Take 180 mg by mouth as needed. Allergies       . fluticasone (FLONASE) 50 MCG/ACT nasal spray Place 2 sprays into the nose as needed. Allergies       . furosemide (LASIX) 40 MG tablet TAKE ONE TABLET BY MOUTH TWICE DAILY  180 tablet  1  . letrozole (FEMARA) 2.5 MG tablet Take 2.5 mg by mouth every evening.        . loratadine (CLARITIN) 10 MG tablet Take 10 mg by mouth daily as needed. For allergies       . montelukast (SINGULAIR) 10 MG tablet Take 10 mg by mouth as needed. For allergies & congestion      . Multiple Vitamin (MULTIVITAMIN) tablet Take 1 tablet by mouth daily.        . niacin (NIASPAN) 1000 MG CR tablet Take 500 mg by mouth at bedtime.       [name] 20 MG capsule Take 20 mg by mouth 2 (two) times daily.        [name] PARoxetine (PAXIL) 10 MG tablet Take 20 mg by mouth every morning.       [name] Glycol-Propyl Glycol (SYSTANE OP) Apply to eye as needed.      . potassium chloride SA (K-DUR,KLOR-CON) 20 MEQ tablet Take 2 tablets (40 mEq total) by mouth daily.      [name] senna-docusate (SENOKOT-S) 8.6-50 MG per tablet Take 1 tablet by mouth daily.        . simvastatin (ZOCOR) 20 MG tablet Take 20 mg by mouth at bedtime.        [name] spironolactone (ALDACTONE) 25 MG tablet TAKE ONE TABLET BY MOUTH ONCE DAILY  30 tablet  3  . traMADol (ULTRAM) 50 MG tablet Take 50 mg by mouth every 6 (six) hours as needed for pain.      . valsartan (DIOVAN) 40 MG tablet TAKE ONE TABLET TWICE DAILY  60 tablet  6  .  [DISCONTINUED] ranitidine (ZANTAC) 300 MG capsule Take 300 mg by mouth daily as needed. Acid  indigestion       No current facility-administered medications for this encounter.     PHYSICAL EXAM: Filed Vitals:   07/16/14 1115  BP: 109/61  Pulse: 84  Resp: 18  Weight: 247 lb 2 oz (112.095 kg)  SpO2: 98%    General:  Well appearing. No resp difficulty Husband present HEENT: normal Neck: supple. JVP difficult to assess due to body habitus but does not appear elevated. Carotids 2+ bilaterally; no bruits. No lymphadenopathy or thryomegaly appreciated. Cor: PMI normal. Regular rate & rhythm. No rubs, gallops or murmurs. Lungs: clear Abdomen: obese, soft, nontender, nondistended. No hepatosplenomegaly. No bruits or masses. Good bowel sounds. Extremities: no cyanosis, clubbing, rash,  Edema  Neuro: alert & orientedx3, cranial nerves grossly intact. Moves all 4 extremities w/o difficulty. Affect pleasant.  ASSESSMENT & PLAN: 1. Chronic Systolic Heart Failure: Nonischemic cardiomyopathy, EF 20% on last echo. S/p St Jude Bi-V ICD with epicardial LV lead. 10/10/2012 CPX with Peak VO2: 10.6% predicted, 64.4% VE/VCO2 slope. Repeat CPX in April was unchanged with major limitations from body habitus not cardiac. 01/2014 CPX---> Peak VO2: 10.3 (66.4% predicted peak VO2)VE/VCO2 slope: 25.0  Overall NYHA class II functionally she is doing well. Volume status stable . Continue Lasix 40 mg bid and 25 mg spironolactone daily.  - Continue current Coreg 12.5 mg in and 18.75 mg in pm.  - Continue valsartan 40 mg daily - Needs to start exercising.   Will ask PCP to check BMET and fax results this week.   2. H/O paroxysmal atrial fibrillation: Only 1 episode associated with stressor in past.  If recurrence is noted, will need anticoagulation. On aspirin 81 mg daily .     Follow up in 3 months with an ECHO and Dr Haroldine Laws Prakriti Carignan NP-C  07/16/2014

## 2014-07-16 NOTE — Patient Instructions (Addendum)
Follow up in 3 months with Dr Haroldine Laws and an ECHO   Please send lab results to HF clinic   Do the following things EVERYDAY: 1) Weigh yourself in the morning before breakfast. Write it down and keep it in a log. 2) Take your medicines as prescribed 3) Eat low salt foods-Limit salt (sodium) to 2000 mg per day.  4) Stay as active as you can everyday 5) Limit all fluids for the day to less than 2 liters

## 2014-07-23 ENCOUNTER — Encounter: Payer: Self-pay | Admitting: Internal Medicine

## 2014-08-12 ENCOUNTER — Encounter: Payer: Self-pay | Admitting: Internal Medicine

## 2014-08-14 ENCOUNTER — Telehealth (HOSPITAL_COMMUNITY): Payer: Self-pay | Admitting: Vascular Surgery

## 2014-08-14 NOTE — Telephone Encounter (Signed)
Pt called twice today about paper worked faxed from another Doctor office, the doctor wants to change some of her medicine.. Pt would like Dr Haroldine Laws to look over the paper before medication is change .[name] Please advise

## 2014-08-18 ENCOUNTER — Ambulatory Visit (HOSPITAL_COMMUNITY)
Admission: RE | Admit: 2014-08-18 | Discharge: 2014-08-18 | Disposition: A | Discharge status: Home / Self Care | Payer: PRIVATE HEALTH INSURANCE | Source: Ambulatory Visit | Attending: Cardiology | Admitting: Cardiology

## 2014-08-18 ENCOUNTER — Encounter (HOSPITAL_COMMUNITY): Payer: Self-pay

## 2014-08-18 VITALS — BP 94/62 | HR 88 | Wt 250.4 lb

## 2014-08-18 DIAGNOSIS — I5022 Chronic systolic (congestive) heart failure: Secondary | ICD-10-CM | POA: Insufficient documentation

## 2014-08-18 DIAGNOSIS — I7100 Dissection of unspecified site of aorta: Secondary | ICD-10-CM | POA: Diagnosis not present

## 2014-08-18 DIAGNOSIS — Z853 Personal history of malignant neoplasm of breast: Secondary | ICD-10-CM | POA: Insufficient documentation

## 2014-08-18 DIAGNOSIS — Z79899 Other long term (current) drug therapy: Secondary | ICD-10-CM | POA: Diagnosis not present

## 2014-08-18 DIAGNOSIS — E785 Hyperlipidemia, unspecified: Secondary | ICD-10-CM | POA: Diagnosis not present

## 2014-08-18 DIAGNOSIS — Z95810 Presence of automatic (implantable) cardiac defibrillator: Secondary | ICD-10-CM | POA: Diagnosis not present

## 2014-08-18 DIAGNOSIS — I5020 Unspecified systolic (congestive) heart failure: Secondary | ICD-10-CM | POA: Diagnosis present

## 2014-08-18 DIAGNOSIS — I480 Paroxysmal atrial fibrillation: Secondary | ICD-10-CM | POA: Diagnosis not present

## 2014-08-18 DIAGNOSIS — I429 Cardiomyopathy, unspecified: Secondary | ICD-10-CM | POA: Diagnosis not present

## 2014-08-18 DIAGNOSIS — Z86718 Personal history of other venous thrombosis and embolism: Secondary | ICD-10-CM | POA: Diagnosis not present

## 2014-08-18 DIAGNOSIS — I428 Other cardiomyopathies: Secondary | ICD-10-CM

## 2014-08-18 DIAGNOSIS — I10 Essential (primary) hypertension: Secondary | ICD-10-CM | POA: Diagnosis not present

## 2014-08-18 LAB — BASIC METABOLIC PANEL
Anion gap: 14 (ref 5–15)
BUN: 20 mg/dL (ref 6–23)
CALCIUM: 9.7 mg/dL (ref 8.4–10.5)
CO2: 24 meq/L (ref 19–32)
CREATININE: 1.24 mg/dL — AB (ref 0.50–1.10)
Chloride: 99 mEq/L (ref 96–112)
GFR calc Af Amer: 52 mL/min — ABNORMAL LOW (ref >90)
GFR calc non Af Amer: 45 mL/min — ABNORMAL LOW (ref >90)
GLUCOSE: 100 mg/dL — AB (ref 70–99)
Potassium: 4.4 mEq/L (ref 3.7–5.3)
Sodium: 137 mEq/L (ref 137–147)

## 2014-08-18 LAB — PRO B NATRIURETIC PEPTIDE: Pro B Natriuretic peptide (BNP): 2902 pg/mL — ABNORMAL HIGH (ref 0–125)

## 2014-08-18 MED ORDER — FUROSEMIDE 40 MG PO TABS: 40.0000 mg | ORAL_TABLET | Freq: Every day | ORAL | Status: DC

## 2014-08-18 MED ORDER — CARVEDILOL 12.5 MG PO TABS: 12.5000 mg | ORAL_TABLET | Freq: Two times a day (BID) | ORAL | Status: DC

## 2014-08-18 MED ORDER — VALSARTAN 40 MG PO TABS: 40.0000 mg | ORAL_TABLET | Freq: Every day | ORAL | Status: DC

## 2014-08-18 NOTE — Patient Instructions (Signed)
Decrease Carvedilol to 12.5 mg Twice daily   Decrease Valsartan to 40 mg at night  Decrease Furosemide (Lasix) to 40 mg in AM  Labs today  Lab in 10 days in [place] recommends that you schedule a follow-up appointment in: 1 month

## 2014-08-18 NOTE — Telephone Encounter (Signed)
Pt given follow up appointment with provider on 08/18/14 pts concerns and lab results reviewed with patient at that time

## 2014-08-18 NOTE — Progress Notes (Signed)
Patient ID: [name], female   DOB: 1951/01/07, 63 y.o.   MRN: [id number] PCP: Dr. Bea Graff Oncologist: Dr. Philipp Ovens at [facility] EP: Dr. [name]  Dr. Bea Graff in [place] Weight Range   220-224 pounds  Baseline proBNP     HPI: [name] is a 63 y.o. female a cardiac history that includes HTN, Type B aortic dissection (2010) managed with medical therapy by Dr. Prescott Gum and has healed, LBBB, systolic heart failure due to NICM with EF 10-15% in May 2013 which has decreased from 65% in 2010.  Cath Dec 2011 showed normal arteries.  She was diagnosed with breast cancer in 2009 s/p lumpectomy, XRT, and chemotherapy.  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   She underwent St Jude Bi-V ICD implantation Sept 2012 but unable to place LV lead.  She eventually brought back in Nov 2012 for left thoracotomy and placement of epicardial LV lead placement by Dr. Prescott Gum.  She also had a sleep study in Jan that showed mild OSA.  No clinical intervention occurred.    She returns for follow up. At last visit, she was stable.  Since then, creatinine went up to 2.3 when checked by PCP earlier this month.  He had her stop valsartan and cut Lasix back to 20 mg bid.  After cutting out valsartan, she thinks she has felt better.  She is no longer lightheaded with standing.  She has more energy. She is not short of breath walking on flat ground.  She does get short of breath walking up an incline (unchanged).  No chest pain.  She does have chronic bendopnea.  No orthopnea/PND.   10/10/2012 CPX Peak VO2: 10.6 % predicted peak VO2: 64.4% VE/VCO2 slope: 26.9 OUES: 1.18 Peak RER: 1.29 Ventilatory Threshold: 7.3 % predicted peak VO2: 44.4% Peak RR 23 Peak Ventilation: 29.8 VE/MVV: 32.6% PETCO2 at peak: 43  01/2014 CPX Peak VO2: 10.3 (66.4% predicted peak VO2) VE/VCO2 slope: 25.0 OUES: 1.30 Peak RER: 1.20  ECHO 01/02/13 EF 50% Grade 1 diastolic  dysfunction. RV ok. No PAH ECHO 07/10/13 EF 20% RV ok  Labs 06/01/13 Creatinine 1.2 Potassium 4.4 Labs 12/14 K 3.9, creatinine 1.2 Labs 01/17/14 K 4.5 Creatinine 1.15  Labs 10/15 K 4.5, creatinine 2.3  ROS: All systems negative except as listed in HPI, PMH and Problem List.  Past Medical History  Diagnosis Date  . Nonischemic cardiomyopathy     Admission 12/11 with EF 25%, normal coronary arteries by cath 12/11; NICM presumed to be secondary to chemotherapy for breast cancer  . DVT (deep venous thrombosis) 10/18/2010  . Obesity   . 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 Glaucoma   . Depression   . Breast cancer 07/2009    Stage 3 lumpectomy, radiation, chemotherapy; finished chemo October, felt to be in remission  . History of shingles   . Hyperlipidemia   . Chronic systolic dysfunction of left ventricle   . Left bundle branch block   . Cardiac defibrillator in place     St. Jude (JA) 9/12; LV lead placement unsuccessful;  s/p left thoracotomy with placement of epicardial LV lead 09/2011 (Dr. PVT)  . [name]     with exertion  . Hypertension   . CHF (congestive heart failure)   . GERD (gastroesophageal reflux disease)   . Arthritis     lower back  . Hiatal hernia  small hiatal hernia  . Atrial fibrillation     post op (left thoracotomy) 11/12 req amio/DCCV    Current Outpatient Prescriptions  Medication Sig Dispense Refill  . albuterol (PROVENTIL HFA;VENTOLIN HFA) 108 (90 BASE) MCG/ACT inhaler Inhale 2 puffs into the lungs every 4 (four) hours as needed. [name]       . albuterol (PROVENTIL) (2.5 MG/3ML) 0.083% nebulizer solution Take 2.5 mg by nebulization as needed. [name]       . allopurinol (ZYLOPRIM) 100 MG tablet Take 50 mg by mouth daily.      [name] ALPRAZolam (XANAX) 1 MG tablet Take 1 mg by mouth as needed. Anxiety       . aspirin 81 MG EC tablet Take 81 mg by mouth daily.        . Bepotastine Besilate (BEPREVE) 1.5 % SOLN  Place 1 drop into both eyes as needed. Dry eyes       . bimatoprost (LUMIGAN) 0.03 % ophthalmic solution Place 1 drop into both eyes at bedtime.        . Calcium Carbonate-Vitamin D (CALCIUM-VITAMIN D3) 600-200 MG-UNIT TABS Take 1 tablet by mouth 2 (two) times daily.        . carvedilol (COREG) 12.5 MG tablet Take 1 tablet (12.5 mg total) by mouth 2 (two) times daily with a meal.  75 tablet  3  . cholecalciferol (VITAMIN D) 1000 UNITS tablet Take 1,000 Units by mouth 2 (two) times daily.        . cyclobenzaprine (FLEXERIL) 10 MG tablet Take 1 tablet (10 mg total) by mouth 3 (three) times daily as needed for muscle spasms.  30 tablet  0  . fexofenadine (ALLEGRA) 180 MG tablet Take 180 mg by mouth as needed. Allergies       . fluticasone (FLONASE) 50 MCG/ACT nasal spray Place 2 sprays into the nose as needed. Allergies       . furosemide (LASIX) 40 MG tablet Take 1 tablet (40 mg total) by mouth daily.  180 tablet  1  . letrozole (FEMARA) 2.5 MG tablet Take 2.5 mg by mouth every evening.        . loratadine (CLARITIN) 10 MG tablet Take 10 mg by mouth daily as needed. For allergies       . montelukast (SINGULAIR) 10 MG tablet Take 10 mg by mouth as needed. For allergies & congestion      . Multiple Vitamin (MULTIVITAMIN) tablet Take 1 tablet by mouth daily.        [name] 20 MG capsule Take 20 mg by mouth 2 (two) times daily.        [name] PARoxetine (PAXIL) 10 MG tablet Take 20 mg by mouth every morning.       [name] Glycol-Propyl Glycol (SYSTANE OP) Apply to eye as needed.      . potassium chloride SA (K-DUR,KLOR-CON) 20 MEQ tablet Take 2 tablets (40 mEq total) by mouth daily.      [name] senna-docusate (SENOKOT-S) 8.6-50 MG per tablet Take 1 tablet by mouth daily.        [name] spironolactone (ALDACTONE) 25 MG tablet TAKE ONE TABLET BY MOUTH ONCE DAILY  30 tablet  3  . traMADol (ULTRAM) 50 MG tablet Take 50 mg by mouth every 6 (six) hours as needed for pain.      . valsartan (DIOVAN) 40  MG tablet Take 1 tablet (40 mg total) by mouth at bedtime.  60 tablet  6  . [  DISCONTINUED] ranitidine (ZANTAC) 300 MG capsule Take 300 mg by mouth daily as needed. Acid indigestion       No current facility-administered medications for this encounter.     PHYSICAL EXAM: Filed Vitals:   08/18/14 1036  BP: 94/62  Pulse: 88  Weight: 250 lb 6.4 oz (113.581 kg)  SpO2: 96%    General:  Well appearing. No resp difficulty Husband present HEENT: normal Neck: supple. JVP difficult to assess due to body habitus but does not appear elevated. Carotids 2+ bilaterally; no bruits. No lymphadenopathy or thryomegaly appreciated. Cor: PMI normal. Regular rate & rhythm. No rubs, gallops or murmurs. Lungs: clear Abdomen: obese, soft, nontender, nondistended. No hepatosplenomegaly. No bruits or masses. Good bowel sounds. Extremities: no cyanosis, clubbing, rash.  No edema.   Neuro: alert & orientedx3, cranial nerves grossly intact. Moves all 4 extremities w/o difficulty. Affect pleasant.  ASSESSMENT & PLAN: 1. Chronic Systolic Heart Failure: Nonischemic cardiomyopathy, EF 20% on last echo. S/p St Jude Bi-V ICD with epicardial LV lead. 10/10/2012 CPX with Peak VO2: 10.6% (64.4% predicted), VE/VCO2 slope 26.9. Repeat CPX in April was unchanged, and it appeared that the major limitation was from body habitus not from her heart. 01/2014 CPX---> Peak VO2: 10.3 (66.4% predicted), VE/VCO2 slope 25.0. Today, she looks near-euvolemic.  Weight is up, however, about 3 lbs.  She has stopped valsartan and cut back Lasix to 20 mg bid with elevated creatinine. Stable NYHA class II functionally. - Rather than 20 mg bid, will have her take Lasix 40 mg qam.   - I would like to try her back on a lower dose of valsartan.  Will back off on Coreg to 12.5 mg bid and will restart 40 mg valsartan in the evening only. Hopefully renal function will tolerate this and she will not get too orthostatic.  - Continue spironolactone 25 daily.   - BMET/BNP today and repeat BMET in 2 wks.  2. Paroxysmal atrial fibrillation: Only 1 episode associated with stressor in past.  If recurrence is noted, will need anticoagulation. On aspirin 81 mg daily.  3. Type B aortic dissection: This has been stable.  BP is not elevated.    Followup in 1 month.  Loralie Champagne 08/18/2014

## 2014-09-16 ENCOUNTER — Encounter (HOSPITAL_COMMUNITY): Payer: PRIVATE HEALTH INSURANCE

## 2014-09-23 ENCOUNTER — Ambulatory Visit (HOSPITAL_COMMUNITY): Payer: PRIVATE HEALTH INSURANCE

## 2014-09-23 ENCOUNTER — Encounter (HOSPITAL_COMMUNITY): Payer: PRIVATE HEALTH INSURANCE

## 2014-09-24 ENCOUNTER — Other Ambulatory Visit (HOSPITAL_COMMUNITY): Payer: Self-pay | Admitting: Internal Medicine

## 2014-09-24 DIAGNOSIS — I5022 Chronic systolic (congestive) heart failure: Secondary | ICD-10-CM

## 2014-09-29 ENCOUNTER — Other Ambulatory Visit (HOSPITAL_COMMUNITY): Payer: Self-pay | Admitting: Internal Medicine

## 2014-09-30 ENCOUNTER — Encounter (HOSPITAL_COMMUNITY): Payer: PRIVATE HEALTH INSURANCE

## 2014-10-02 ENCOUNTER — Other Ambulatory Visit: Payer: Self-pay

## 2014-10-02 ENCOUNTER — Encounter (HOSPITAL_COMMUNITY): Payer: PRIVATE HEALTH INSURANCE

## 2014-10-02 MED ORDER — VALSARTAN 40 MG PO TABS: 40.0000 mg | ORAL_TABLET | Freq: Every day | ORAL | Status: DC

## 2014-10-06 ENCOUNTER — Encounter: Payer: Self-pay | Admitting: Internal Medicine

## 2014-10-06 ENCOUNTER — Ambulatory Visit (INDEPENDENT_AMBULATORY_CARE_PROVIDER_SITE_OTHER): Payer: PRIVATE HEALTH INSURANCE | Admitting: *Deleted

## 2014-10-06 DIAGNOSIS — I428 Other cardiomyopathies: Secondary | ICD-10-CM

## 2014-10-06 DIAGNOSIS — I5022 Chronic systolic (congestive) heart failure: Secondary | ICD-10-CM

## 2014-10-06 DIAGNOSIS — I429 Cardiomyopathy, unspecified: Secondary | ICD-10-CM

## 2014-10-06 LAB — MDC_IDC_ENUM_SESS_TYPE_REMOTE
Battery Voltage: 2.84 V
Brady Statistic AP VP Percent: 74 %
Brady Statistic AP VS Percent: 1.4 %
Brady Statistic AS VP Percent: 24 %
Brady Statistic AS VS Percent: 1 %
Date Time Interrogation Session: 20151207070026
HighPow Impedance: 88 Ohm
HighPow Impedance: 88 Ohm
Lead Channel Impedance Value: 290 Ohm
Lead Channel Impedance Value: 360 Ohm
Lead Channel Sensing Intrinsic Amplitude: 0.6 mV
Lead Channel Sensing Intrinsic Amplitude: 11.9 mV
Lead Channel Setting Pacing Pulse Width: 1 ms
Lead Channel Setting Sensing Sensitivity: 0.5 mV
MDC IDC MSMT BATTERY REMAINING LONGEVITY: 12 mo
MDC IDC MSMT BATTERY REMAINING PERCENTAGE: 30 %
MDC IDC MSMT LEADCHNL RA IMPEDANCE VALUE: 380 Ohm
MDC IDC PG SERIAL: 7004282
MDC IDC SET LEADCHNL LV PACING AMPLITUDE: 3.5 V
MDC IDC SET LEADCHNL RA PACING AMPLITUDE: 2 V
MDC IDC SET LEADCHNL RV PACING AMPLITUDE: 2.5 V
MDC IDC SET LEADCHNL RV PACING PULSEWIDTH: 0.8 ms
MDC IDC STAT BRADY RA PERCENT PACED: 75 %
Zone Setting Detection Interval: 250 ms

## 2014-10-06 NOTE — Progress Notes (Signed)
Remote ICD transmission.   

## 2014-10-13 ENCOUNTER — Encounter (HOSPITAL_COMMUNITY): Payer: Self-pay

## 2014-10-13 ENCOUNTER — Other Ambulatory Visit (HOSPITAL_COMMUNITY): Payer: PRIVATE HEALTH INSURANCE

## 2014-10-13 ENCOUNTER — Encounter (HOSPITAL_COMMUNITY): Payer: PRIVATE HEALTH INSURANCE

## 2014-10-13 ENCOUNTER — Ambulatory Visit (HOSPITAL_BASED_OUTPATIENT_CLINIC_OR_DEPARTMENT_OTHER)
Admission: RE | Admit: 2014-10-13 | Discharge: 2014-10-13 | Disposition: A | Discharge status: Home / Self Care | Payer: PRIVATE HEALTH INSURANCE | Source: Ambulatory Visit | Attending: Internal Medicine | Admitting: Internal Medicine

## 2014-10-13 ENCOUNTER — Ambulatory Visit (HOSPITAL_COMMUNITY)
Admission: RE | Admit: 2014-10-13 | Discharge: 2014-10-13 | Disposition: A | Discharge status: Home / Self Care | Payer: PRIVATE HEALTH INSURANCE | Source: Ambulatory Visit | Attending: Adult Health | Admitting: Adult Health

## 2014-10-13 VITALS — BP 86/60 | HR 96 | Wt 246.0 lb

## 2014-10-13 DIAGNOSIS — I480 Paroxysmal atrial fibrillation: Secondary | ICD-10-CM | POA: Diagnosis not present

## 2014-10-13 DIAGNOSIS — K219 Gastro-esophageal reflux disease without esophagitis: Secondary | ICD-10-CM | POA: Diagnosis not present

## 2014-10-13 DIAGNOSIS — Z7982 Long term (current) use of aspirin: Secondary | ICD-10-CM | POA: Insufficient documentation

## 2014-10-13 DIAGNOSIS — Z853 Personal history of malignant neoplasm of breast: Secondary | ICD-10-CM | POA: Insufficient documentation

## 2014-10-13 DIAGNOSIS — Z86718 Personal history of other venous thrombosis and embolism: Secondary | ICD-10-CM | POA: Diagnosis not present

## 2014-10-13 DIAGNOSIS — I428 Other cardiomyopathies: Secondary | ICD-10-CM

## 2014-10-13 DIAGNOSIS — Z95810 Presence of automatic (implantable) cardiac defibrillator: Secondary | ICD-10-CM | POA: Insufficient documentation

## 2014-10-13 DIAGNOSIS — E785 Hyperlipidemia, unspecified: Secondary | ICD-10-CM | POA: Diagnosis not present

## 2014-10-13 DIAGNOSIS — E669 Obesity, unspecified: Secondary | ICD-10-CM | POA: Insufficient documentation

## 2014-10-13 DIAGNOSIS — Z79899 Other long term (current) drug therapy: Secondary | ICD-10-CM | POA: Insufficient documentation

## 2014-10-13 DIAGNOSIS — I10 Essential (primary) hypertension: Secondary | ICD-10-CM | POA: Insufficient documentation

## 2014-10-13 DIAGNOSIS — G4733 Obstructive sleep apnea (adult) (pediatric): Secondary | ICD-10-CM | POA: Insufficient documentation

## 2014-10-13 DIAGNOSIS — Z923 Personal history of irradiation: Secondary | ICD-10-CM | POA: Diagnosis not present

## 2014-10-13 DIAGNOSIS — I7100 Dissection of unspecified site of aorta: Secondary | ICD-10-CM | POA: Diagnosis not present

## 2014-10-13 DIAGNOSIS — I5022 Chronic systolic (congestive) heart failure: Secondary | ICD-10-CM | POA: Diagnosis present

## 2014-10-13 DIAGNOSIS — I519 Heart disease, unspecified: Secondary | ICD-10-CM

## 2014-10-13 DIAGNOSIS — Z9221 Personal history of antineoplastic chemotherapy: Secondary | ICD-10-CM | POA: Insufficient documentation

## 2014-10-13 DIAGNOSIS — I447 Left bundle-branch block, unspecified: Secondary | ICD-10-CM | POA: Diagnosis not present

## 2014-10-13 DIAGNOSIS — I429 Cardiomyopathy, unspecified: Secondary | ICD-10-CM | POA: Diagnosis not present

## 2014-10-13 NOTE — Patient Instructions (Signed)
Your physician has recommended that you have a cardiopulmonary stress test (CPX). CPX testing is a non-invasive measurement of heart and lung function. It replaces a traditional treadmill stress test. This type of test provides a tremendous amount of information that relates not only to your present condition but also for future outcomes. This test combines measurements of you ventilation, respiratory gas exchange in the lungs, electrocardiogram (EKG), blood pressure and physical response before, during, and following an exercise protocol.  Your physician recommends that you schedule a follow-up appointment in: 2-3 months
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
## 2014-10-20 ENCOUNTER — Encounter: Payer: Self-pay | Admitting: Cardiology

## 2014-11-04 ENCOUNTER — Other Ambulatory Visit: Payer: Self-pay | Admitting: Cardiovascular Disease

## 2014-12-09 ENCOUNTER — Other Ambulatory Visit (HOSPITAL_COMMUNITY): Payer: Self-pay

## 2014-12-09 DIAGNOSIS — I5022 Chronic systolic (congestive) heart failure: Secondary | ICD-10-CM

## 2014-12-09 MED ORDER — SPIRONOLACTONE 25 MG PO TABS: 25.0000 mg | ORAL_TABLET | Freq: Every day | ORAL | Status: DC

## 2014-12-09 MED ORDER — CARVEDILOL 12.5 MG PO TABS: 12.5000 mg | ORAL_TABLET | Freq: Two times a day (BID) | ORAL | Status: DC

## 2014-12-31 ENCOUNTER — Ambulatory Visit (INDEPENDENT_AMBULATORY_CARE_PROVIDER_SITE_OTHER): Payer: PRIVATE HEALTH INSURANCE | Admitting: Internal Medicine

## 2014-12-31 ENCOUNTER — Encounter: Payer: Self-pay | Admitting: Internal Medicine

## 2014-12-31 VITALS — BP 110/60 | HR 100 | Ht 61.0 in | Wt 248.1 lb

## 2014-12-31 DIAGNOSIS — I5022 Chronic systolic (congestive) heart failure: Secondary | ICD-10-CM

## 2014-12-31 DIAGNOSIS — Z95810 Presence of automatic (implantable) cardiac defibrillator: Secondary | ICD-10-CM

## 2014-12-31 DIAGNOSIS — I447 Left bundle-branch block, unspecified: Secondary | ICD-10-CM

## 2014-12-31 DIAGNOSIS — I429 Cardiomyopathy, unspecified: Secondary | ICD-10-CM

## 2014-12-31 DIAGNOSIS — I10 Essential (primary) hypertension: Secondary | ICD-10-CM

## 2014-12-31 DIAGNOSIS — I428 Other cardiomyopathies: Secondary | ICD-10-CM

## 2014-12-31 LAB — MDC_IDC_ENUM_SESS_TYPE_INCLINIC
Brady Statistic RA Percent Paced: 73 %
Brady Statistic RV Percent Paced: 98 %
HighPow Impedance: 89 Ohm
Implantable Pulse Generator Serial Number: 7004282
Lead Channel Impedance Value: 287.5 Ohm
Lead Channel Pacing Threshold Amplitude: 0.75 V
Lead Channel Pacing Threshold Amplitude: 1 V
Lead Channel Pacing Threshold Amplitude: 1 V
Lead Channel Pacing Threshold Pulse Width: 0.5 ms
Lead Channel Pacing Threshold Pulse Width: 0.8 ms
Lead Channel Pacing Threshold Pulse Width: 0.8 ms
Lead Channel Sensing Intrinsic Amplitude: 0.5 mV
Lead Channel Setting Pacing Amplitude: 2 V
Lead Channel Setting Pacing Pulse Width: 0.8 ms
Lead Channel Setting Pacing Pulse Width: 1 ms
Lead Channel Setting Sensing Sensitivity: 0.5 mV
MDC IDC MSMT BATTERY REMAINING LONGEVITY: 10 mo
MDC IDC MSMT LEADCHNL LV PACING THRESHOLD AMPLITUDE: 1.75 V
MDC IDC MSMT LEADCHNL LV PACING THRESHOLD AMPLITUDE: 1.75 V
MDC IDC MSMT LEADCHNL LV PACING THRESHOLD PULSEWIDTH: 1 ms
MDC IDC MSMT LEADCHNL LV PACING THRESHOLD PULSEWIDTH: 1 ms
MDC IDC MSMT LEADCHNL RA IMPEDANCE VALUE: 412.5 Ohm
MDC IDC MSMT LEADCHNL RA PACING THRESHOLD AMPLITUDE: 0.75 V
MDC IDC MSMT LEADCHNL RA PACING THRESHOLD PULSEWIDTH: 0.5 ms
MDC IDC MSMT LEADCHNL RV IMPEDANCE VALUE: 437.5 Ohm
MDC IDC MSMT LEADCHNL RV SENSING INTR AMPL: 11.9 mV
MDC IDC SESS DTM: 20160302123319
MDC IDC SET LEADCHNL LV PACING AMPLITUDE: 3.5 V
MDC IDC SET LEADCHNL RV PACING AMPLITUDE: 2.5 V
MDC IDC SET ZONE DETECTION INTERVAL: 250 ms

## 2014-12-31 NOTE — Progress Notes (Signed)
Electrophysiology Office Note   Date:  12/31/2014   ID:  MEGHA AGNES, DOB 11/09/1950, MRN [id number]  PCP:  [name], MD  Cardiologist:  Dr Angelena Form, also followed in the advanced CHF clinic Primary Electrophysiologist: Thompson Grayer, MD    Chief Complaint  Patient presents with  . [name]     History of Present Illness: [name] is a 64 y.o. female who presents today for electrophysiology evaluation.   She has done reasonably well.  She remains overweight.  Her primary concern today is with chronic knee pain.     Today, she denies symptoms of palpitations, chest pain, shortness of breath, orthopnea, PND, lower extremity edema, claudication, dizziness, presyncope, syncope, bleeding, or neurologic sequela. The patient is tolerating medications without difficulties and is otherwise without complaint today.    Past Medical History  Diagnosis Date  . Nonischemic cardiomyopathy     Admission 12/11 with EF 25%, normal coronary arteries by cath 12/11; NICM presumed to be secondary to chemotherapy for breast cancer  . DVT (deep venous thrombosis) 10/18/2010  . Obesity   . 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 Glaucoma   . Depression   . Breast cancer 07/2009    Stage 3 lumpectomy, radiation, chemotherapy; finished chemo October, felt to be in remission  . History of shingles   . Hyperlipidemia   . Chronic systolic dysfunction of left ventricle   . Left bundle branch block   . Cardiac defibrillator in place     St. Jude (JA) 9/12; LV lead placement unsuccessful;  s/p left thoracotomy with placement of epicardial LV lead 09/2011 (Dr. PVT)  . [name]     with exertion  . Hypertension   . CHF (congestive heart failure)   . GERD (gastroesophageal reflux disease)   . Arthritis     lower back  . Hiatal hernia     small hiatal hernia  . Atrial fibrillation     post op (left thoracotomy) 11/12 req amio/DCCV   Past Surgical History  Procedure  Laterality Date  . Tubal ligation      Bilateral  . Oophorectomy      Single  . Breast lumpectomy      Right breast  . Tonsillectomy    . Cardiac defibrillator placement    . Cardiac catheterization      2011  . Thoracotomy  09/16/2011    Procedure: THORACOTOMY MAJOR;  Surgeon: Tharon Aquas Adelene Idler, MD;  Location: [facility] OR;  Service: Thoracic;  Laterality: Left;  left anterolateral Thoracotomy for placement of St. Jude epicardial pacing lead      Current Outpatient Prescriptions  Medication Sig Dispense Refill  . albuterol (PROVENTIL HFA;VENTOLIN HFA) 108 (90 BASE) MCG/ACT inhaler Inhale 2 puffs into the lungs every 4 (four) hours as needed. [name]     . albuterol (PROVENTIL) (2.5 MG/3ML) 0.083% nebulizer solution Take 2.5 mg by nebulization every 4 (four) hours as needed. [name]     . allopurinol (ZYLOPRIM) 100 MG tablet Take 50 mg by mouth daily.    [name] ALPRAZolam (XANAX) 1 MG tablet Take 1 mg by mouth daily as needed. Anxiety     . aspirin 81 MG EC tablet Take 81 mg by mouth daily.      . Bepotastine Besilate (BEPREVE) 1.5 % SOLN Place 1 drop into both eyes daily as needed. Dry eyes     . bimatoprost (LUMIGAN) 0.03 % ophthalmic solution  Place 1 drop into both eyes at bedtime.      . Calcium Carbonate-Vitamin D (CALCIUM-VITAMIN D3) 600-200 MG-UNIT TABS Take 1 tablet by mouth 2 (two) times daily.      . carvedilol (COREG) 12.5 MG tablet Take 1 tablet (12.5 mg total) by mouth 2 (two) times daily with a meal. 60 tablet 3  . cholecalciferol (VITAMIN D) 1000 UNITS tablet Take 1,000 Units by mouth 2 (two) times daily.      . cyclobenzaprine (FLEXERIL) 10 MG tablet Take 1 tablet (10 mg total) by mouth 3 (three) times daily as needed for muscle spasms. 30 tablet 0  . fexofenadine (ALLEGRA) 180 MG tablet Take 180 mg by mouth daily as needed. Allergies     . fluticasone (FLONASE) 50 MCG/ACT nasal spray Place 2 sprays into the nose daily as needed. Allergies     .  furosemide (LASIX) 40 MG tablet Take 1 tablet (40 mg total) by mouth daily. 180 tablet 1  . letrozole (FEMARA) 2.5 MG tablet Take 2.5 mg by mouth every evening.      . loratadine (CLARITIN) 10 MG tablet Take 10 mg by mouth daily as needed. For allergies     . montelukast (SINGULAIR) 10 MG tablet Take 10 mg by mouth at bedtime. For allergies & congestion    . Multiple Vitamin (MULTIVITAMIN) tablet Take 1 tablet by mouth daily.      [name] 20 MG capsule Take 20 mg by mouth 2 (two) times daily.      [name] PARoxetine (PAXIL) 10 MG tablet Take 10 mg by mouth every morning.     [name] Glycol-Propyl Glycol (SYSTANE OP) Apply 1 drop to eye daily as needed (dry eyes).     . potassium chloride SA (K-DUR,KLOR-CON) 20 MEQ tablet Take 2 tablets (40 mEq total) by mouth daily.    [name] senna-docusate (SENOKOT-S) 8.6-50 MG per tablet Take 1 tablet by mouth daily.      [name] spironolactone (ALDACTONE) 25 MG tablet Take 1 tablet (25 mg total) by mouth daily. 90 tablet 3  . traMADol (ULTRAM) 50 MG tablet Take 50 mg by mouth every 6 (six) hours as needed for pain.    . valsartan (DIOVAN) 40 MG tablet Take 1 tablet (40 mg total) by mouth at bedtime. 60 tablet 6  . [DISCONTINUED] ranitidine (ZANTAC) 300 MG capsule Take 300 mg by mouth daily as needed. Acid indigestion     No current facility-administered medications for this visit.    Allergies:   Alphagan; Tape; and Travatan z   Social History:  The patient  reports that she has never smoked. She has never used smokeless tobacco. She reports that she does not drink alcohol or use illicit drugs.   Family History:  The patient's family history includes Coronary artery disease in her maternal grandfather and paternal grandfather; Heart attack in her maternal grandfather and paternal grandfather; Hypertension in her brother, brother, father, maternal grandfather, mother, and paternal grandfather.    ROS:  Please see the history of present illness.   All  other systems are reviewed and negative.    PHYSICAL EXAM: VS:  BP 110/60 mmHg  Pulse 100  Ht 5\' 1"  (1.549 m)  Wt 248 lb 1.9 oz (112.546 kg)  BMI 46.91 kg/m2 , BMI Body mass index is 46.91 kg/(m^2). GEN: overweight, in no acute distress HEENT: normal Neck: no JVD, carotid bruits, or masses Cardiac: RRR; no murmurs, rubs, or gallops,no edema  Respiratory:  clear to  auscultation bilaterally, normal work of breathing GI: soft, nontender, nondistended, + BS MS: no deformity or atrophy Skin: warm and dry, device pocket is well healed Neuro:  Strength and sensation are intact Psych: euthymic mood, full affect  EKG:  EKG is ordered today. The ekg ordered today shows sinus rhythm with BiV pacing  Device interrogation is reviewed today in detail.  See PaceArt for details.   Recent Labs: 01/17/2014: B Natriuretic Peptide 140.7* 08/18/2014: BUN 20; Creatinine 1.24*; Potassium 4.4; Pro B Natriuretic peptide (BNP) 2902.0*; Sodium 137    Lipid Panel     Component Value Date/Time   CHOL  10/05/2010 0619    190        ATP III CLASSIFICATION:  <200     mg/dL   Desirable  200-239  mg/dL   Borderline High  >=240    mg/dL   High          TRIG 90 10/05/2010 0619   HDL 39* 10/05/2010 0619   CHOLHDL 4.9 10/05/2010 0619   VLDL 18 10/05/2010 0619   LDLCALC * 10/05/2010 0619    133        Total Cholesterol/HDL:CHD Risk Coronary Heart Disease Risk Table                     Men   Women  1/2 Average Risk   3.4   3.3  Average Risk       5.0   4.4  2 X Average Risk   9.6   7.1  3 X Average Risk  23.4   11.0        Use the calculated Patient Ratio above and the CHD Risk Table to determine the patient's CHD Risk.        ATP III CLASSIFICATION (LDL):  <100     mg/dL   Optimal  100-129  mg/dL   Near or Above                    Optimal  130-159  mg/dL   Borderline  160-189  mg/dL   High  >190     mg/dL   Very High     Wt Readings from Last 3 Encounters:  12/31/14 248 lb 1.9 oz  (112.546 kg)  10/13/14 246 lb (111.585 kg)  07/16/14 247 lb 2 oz (112.095 kg)      Other studies Reviewed: Additional studies/ records that were reviewed today include: labs from 12/24/14 are reviewed    ASSESSMENT AND PLAN:  1.  Nonischemic CM/ chronic systolic dysfunction/ LBBB Doing well at this time euvolemic today Normal BiV ICD function See Pace Art report No changes today Merlin  2. afib Device interrogation reveals sensor response inappropriately characterized as AF.  I do not see any recent sustained AF and therefore would not initiate anticoagulation at this time  3. Obesity Weight loss is encouraged Referred to the [facility] afib weight loss session  4. HTN Stable No change required today    Current medicines are reviewed at length with the patient today.   The patient does not have concerns regarding her medicines.  The following changes were made today:  none  Labs/ tests ordered today include: Orders Placed This Encounter  Procedures  . Implantable device check    Follow-up: return to see [name] NP in the device clinic in 6 months, Merlin, I will see in a year  Signed, Thompson Grayer, MD  12/31/2014 10:40 PM  [place] [address] [phone] (office) [phone] (fax)

## 2014-12-31 NOTE — Patient Instructions (Addendum)
Your physician wants you to follow-up in: 6 months with [name], NP and 12 months with Dr Vallery Ridge will receive a reminder letter in the mail two months in advance. If you don't receive a letter, please call our office to schedule the follow-up appointment.  Remote monitoring is used to monitor your Pacemaker of ICD from home. This monitoring reduces the number of office visits required to check your device to one time per year. It allows [place] to keep an eye on the functioning of your device to ensure it is working properly. You are scheduled for a device check from home on 04/01/15. You may send your transmission at any time that day. If you have a wireless device, the transmission will be sent automatically. After your physician reviews your transmission, you will receive a postcard with your next transmission date.  01/09/15 at 12 :00 noon [facility]---Healthy eating class.  Code to get in parking garage is [id number]

## 2015-03-06 ENCOUNTER — Other Ambulatory Visit (HOSPITAL_COMMUNITY): Payer: Self-pay | Admitting: Internal Medicine

## 2015-03-27 ENCOUNTER — Telehealth: Payer: Self-pay | Admitting: Internal Medicine

## 2015-03-27 NOTE — Telephone Encounter (Signed)
New Message  Pt wanted to make sure she can take home monitor with her to beach, and send remote def check from there. Pamala Hurry confirmed pt moving home monitor. Pt was notified and was agreeable.

## 2015-04-08 ENCOUNTER — Encounter: Payer: PRIVATE HEALTH INSURANCE | Admitting: *Deleted

## 2015-04-08 ENCOUNTER — Telehealth: Payer: Self-pay | Admitting: Cardiology

## 2015-04-08 NOTE — Telephone Encounter (Signed)
Confirmed remote transmission w/ pt husband.   

## 2015-04-10 ENCOUNTER — Encounter: Payer: Self-pay | Admitting: Cardiology

## 2015-04-29 ENCOUNTER — Other Ambulatory Visit (HOSPITAL_COMMUNITY): Payer: Self-pay | Admitting: *Deleted

## 2015-04-29 ENCOUNTER — Other Ambulatory Visit (HOSPITAL_COMMUNITY): Payer: Self-pay | Admitting: Internal Medicine

## 2015-04-29 MED ORDER — CARVEDILOL 12.5 MG PO TABS: 12.5000 mg | ORAL_TABLET | Freq: Two times a day (BID) | ORAL | Status: DC

## 2015-06-22 ENCOUNTER — Other Ambulatory Visit (HOSPITAL_COMMUNITY): Payer: Self-pay | Admitting: Internal Medicine

## 2015-07-14 NOTE — Progress Notes (Signed)
Electrophysiology Office Note Date: 07/15/2015  ID:  [name], DOB 1951-07-10, MRN [id number]  PCP: Brittney Rile, MD Primary Cardiologist: [name]/AHF clinic Electrophysiologist: Allred  CC: CHF follow up  [name] is a 64 y.o. female seen today for Dr [name].  She presents today for routine electrophysiology followup.  Since last being seen in our clinic, the patient reports doing very well. She denies chest pain, palpitations, PND, orthopnea, nausea, vomiting, dizziness, syncope, edema, weight gain, or early satiety.  She has not had ICD shocks. She takes prn extra Lasix based on weight and symptoms.  She is active 3-4 hours per day.   Device History: STJ CRTD implanted 2012 for NICM, LBBB, CHF (epicardial LV lead placed by [facility]CTS 2012) History of appropriate therapy: No History of AAD therapy: No   Past Medical History  Diagnosis Date  . Nonischemic cardiomyopathy     Admission 12/11 with EF 25%, normal coronary arteries by cath 12/11; NICM presumed to be secondary to chemotherapy for breast cancer  . DVT (deep venous thrombosis) 10/18/2010  . Obesity   . 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 Glaucoma   . Depression   . Breast cancer 07/2009    Stage 3 lumpectomy, radiation, chemotherapy; finished chemo October, felt to be in remission  . History of shingles   . Hyperlipidemia   . Chronic systolic dysfunction of left ventricle   . Left bundle branch block   . Cardiac defibrillator in place     St. Jude (JA) 9/12; LV lead placement unsuccessful;  s/p left thoracotomy with placement of epicardial LV lead 09/2011 (Dr. PVT)  . [name]     with exertion  . Hypertension   . CHF (congestive heart failure)   . GERD (gastroesophageal reflux disease)   . Arthritis     lower back  . Hiatal hernia     small hiatal hernia  . Atrial fibrillation     post op (left thoracotomy) 11/12 req amio/DCCV   Past Surgical History  Procedure Laterality Date  .  Tubal ligation      Bilateral  . Oophorectomy      Single  . Breast lumpectomy      Right breast  . Tonsillectomy    . Cardiac defibrillator placement    . Cardiac catheterization      2011  . Thoracotomy  09/16/2011    Procedure: THORACOTOMY MAJOR;  Surgeon: Brittney Aquas Adelene Idler, MD;  Location: [facility] OR;  Service: Thoracic;  Laterality: Left;  left anterolateral Thoracotomy for placement of St. Jude epicardial pacing lead     Current Outpatient Prescriptions  Medication Sig Dispense Refill  . albuterol (PROVENTIL HFA;VENTOLIN HFA) 108 (90 BASE) MCG/ACT inhaler Inhale 2 puffs into the lungs every 4 (four) hours as needed. [name]     . albuterol (PROVENTIL) (2.5 MG/3ML) 0.083% nebulizer solution Take 2.5 mg by nebulization every 4 (four) hours as needed. [name]     . allopurinol (ZYLOPRIM) 100 MG tablet Take 50 mg by mouth daily.    [name] ALPRAZolam (XANAX) 1 MG tablet Take 1 mg by mouth daily as needed. Anxiety     . aspirin 81 MG EC tablet Take 81 mg by mouth daily.      . Bepotastine Besilate (BEPREVE) 1.5 % SOLN Place 1 drop into both eyes daily as needed. Dry eyes     . bimatoprost (LUMIGAN) 0.03 % ophthalmic solution Place 1 drop  into both eyes at bedtime.      . Calcium Carbonate-Vitamin D (CALCIUM-VITAMIN D3) 600-200 MG-UNIT TABS Take 1 tablet by mouth 2 (two) times daily.      . carvedilol (COREG) 12.5 MG tablet Take 1 tablet (12.5 mg total) by mouth 2 (two) times daily with a meal. 225 tablet 3  . cholecalciferol (VITAMIN D) 1000 UNITS tablet Take 1,000 Units by mouth 2 (two) times daily.      . colchicine 0.6 MG tablet Take 0.6 mg by mouth daily as needed. (GOUT)  0  . cyclobenzaprine (FLEXERIL) 10 MG tablet Take 1 tablet (10 mg total) by mouth 3 (three) times daily as needed for muscle spasms. 30 tablet 0  . diclofenac sodium (VOLTAREN) 1 % GEL Apply 1 g topically daily as needed. (PAIN)    . fexofenadine (ALLEGRA) 180 MG tablet Take 180 mg by mouth daily  as needed. Allergies     . fluticasone (FLONASE) 50 MCG/ACT nasal spray Place 2 sprays into the nose daily as needed. Allergies     . furosemide (LASIX) 40 MG tablet Take 1 tablet (40 mg total) by mouth daily. 180 tablet 1  . letrozole (FEMARA) 2.5 MG tablet Take 2.5 mg by mouth every evening.      . loratadine (CLARITIN) 10 MG tablet Take 10 mg by mouth daily as needed. For allergies     . montelukast (SINGULAIR) 10 MG tablet Take 10 mg by mouth at bedtime. For allergies & congestion    . Multiple Vitamin (MULTIVITAMIN) tablet Take 1 tablet by mouth daily.      [name] 20 MG capsule Take 20 mg by mouth 2 (two) times daily.      [name] PARoxetine (PAXIL) 10 MG tablet Take 10 mg by mouth every morning.     [name] Glycol-Propyl Glycol (SYSTANE OP) Apply 1 drop to eye daily as needed (dry eyes).     [name] senna-docusate (SENOKOT-S) 8.6-50 MG per tablet Take 1 tablet by mouth daily.      [name] spironolactone (ALDACTONE) 25 MG tablet Take 1 tablet (25 mg total) by mouth daily. 90 tablet 3  . traMADol (ULTRAM) 50 MG tablet Take 50 mg by mouth every 6 (six) hours as needed for pain.    . valsartan (DIOVAN) 40 MG tablet Take 1 tablet (40 mg total) by mouth at bedtime. 60 tablet 6  . [DISCONTINUED] ranitidine (ZANTAC) 300 MG capsule Take 300 mg by mouth daily as needed. Acid indigestion     No current facility-administered medications for this visit.    Allergies:   Alphagan; Tape; and Travatan z   Social History: Social History   Social History  . Marital Status: Married    Spouse Name: N/A  . Number of Children: N/A  . Years of Education: N/A   Occupational History  . [employer]  . Retired from Science writer    Social History Main Topics  . Smoking status: Never Smoker   . Smokeless tobacco: Never Used  . Alcohol Use: No  . Drug Use: No  . Sexual Activity: Not on file   Other Topics Concern  . Not on file   Social History Narrative   Married   One child     Lives in [place] with spouse and mother in law   Previously worked as a Art gallery manager for [employer]      She is a Leisure centre manager counsel member.    Family History: Family  History  Problem Relation Age of Onset  . Hypertension Mother   . Hypertension Father   . Hypertension Brother   . Hypertension Maternal Grandfather   . Heart attack Maternal Grandfather     MI  . Coronary artery disease Maternal Grandfather   . Hypertension Paternal Grandfather   . Heart attack Paternal Grandfather     MI  . Coronary artery disease Paternal Grandfather   . Hypertension Brother     Review of Systems: General: No chills, fever, night sweats or weight changes  Cardiovascular:  No chest pain, orthopnea, palpitations, paroxysmal nocturnal dyspnea; denies ICD shocks Dermatological: No rash, lesions or masses Respiratory: No cough, dyspnea Urologic: No hematuria, dysuria Abdominal: No nausea, vomiting, diarrhea, bright red blood per rectum, melena, or hematemesis Neurologic: No visual changes, weakness, changes in mental status All other systems reviewed and are otherwise negative except as noted above.   Physical Exam: VS:  BP 108/62 mmHg  Pulse 78  Ht 5\' 2"  (1.575 m)  Wt 246 lb (111.585 kg)  BMI 44.98 kg/m2  SpO2 96% , BMI Body mass index is 44.98 kg/(m^2).  GEN- The patient is obese appearing, alert and oriented x 3 today.   HEENT: normocephalic, atraumatic; sclera clear, conjunctiva pink; hearing intact; oropharynx clear; neck supple  Lungs- Clear to ausculation bilaterally, normal work of breathing.  No wheezes, rales, rhonchi Heart- Regular rate and rhythm (paced) GI- soft, non-tender, non-distended, bowel sounds present  Extremities- no clubbing, cyanosis, 1+ BLE edema; DP/PT/radial pulses 2+ bilaterally MS- no significant deformity or atrophy Skin- warm and dry, no rash or lesion; ICD pocket well healed Psych- euthymic mood, full affect Neuro- strength and sensation are  intact  ICD interrogation- reviewed in detail today,  See PACEART report  EKG:  EKG is not ordered today.  Recent Labs: 08/18/2014: BUN 20; Creatinine, Ser 1.24*; Potassium 4.4; Pro B Natriuretic peptide (BNP) 2902.0*; Sodium 137   Wt Readings from Last 3 Encounters:  07/15/15 246 lb (111.585 kg)  12/31/14 248 lb 1.9 oz (112.546 kg)  10/13/14 246 lb (111.585 kg)     Other studies Reviewed: Additional studies/ records that were reviewed today include: AHF notes, Dr Jackalyn Lombard last office notes  Assessment and Plan:  1.  Chronic systolic dysfunction/NICM euvolemic today Stable on an appropriate medical regimen Normal ICD function - her device is nearing ERI. See Claudia Desanctis Art report No changes today She is overdue for AHF follow-up.  At last office visit, CPX testing was recommended, will order at this time and schedule follow up with HF clinic BMET done recently by PCP and normal per patient report  2.  Paroxysmal atrial fibrillation Device interrogation today demonstrates mode switch episodes that are atrial tach and not atrial fibrillation If she had documented sustained episodes of AF, will need to consider initiation of anticoagulation CHADS2VASC is at least 3  3. Obesity Weight loss encouraged today  4.  HTN Stable No change required today  5.  Type B aortic dissection BP well controlled Stable  She is overdue to be seen in the HF clinic. Will arrange follow up.    Current medicines are reviewed at length with the patient today.   The patient does not have concerns regarding her medicines.  The following changes were made today:  none  Labs/ tests ordered today include: CPX  Disposition:   Follow up with Merlin transmissions, ICM clinic, Dr [name] in 6 months    Signed, [name], NP 07/15/2015 2:37 PM  [place] [address] [phone] (office) [phone] (fax

## 2015-07-15 ENCOUNTER — Encounter: Payer: Self-pay | Admitting: Nurse Practitioner

## 2015-07-15 ENCOUNTER — Encounter: Payer: Self-pay | Admitting: Internal Medicine

## 2015-07-15 ENCOUNTER — Ambulatory Visit (INDEPENDENT_AMBULATORY_CARE_PROVIDER_SITE_OTHER): Payer: PRIVATE HEALTH INSURANCE | Admitting: Nurse Practitioner

## 2015-07-15 VITALS — BP 108/62 | HR 78 | Ht 62.0 in | Wt 246.0 lb

## 2015-07-15 DIAGNOSIS — I429 Cardiomyopathy, unspecified: Secondary | ICD-10-CM | POA: Diagnosis not present

## 2015-07-15 DIAGNOSIS — I10 Essential (primary) hypertension: Secondary | ICD-10-CM

## 2015-07-15 DIAGNOSIS — I5022 Chronic systolic (congestive) heart failure: Secondary | ICD-10-CM | POA: Diagnosis not present

## 2015-07-15 DIAGNOSIS — I480 Paroxysmal atrial fibrillation: Secondary | ICD-10-CM | POA: Diagnosis not present

## 2015-07-15 DIAGNOSIS — I519 Heart disease, unspecified: Secondary | ICD-10-CM | POA: Diagnosis not present

## 2015-07-15 DIAGNOSIS — I428 Other cardiomyopathies: Secondary | ICD-10-CM

## 2015-07-15 NOTE — Patient Instructions (Addendum)
Medication Instructions:   Your physician recommends that you continue on your current medications as directed. Please refer to the Current Medication list given to you today.   Labwork:  BMET   Testing/Procedures:  Your physician has recommended that you have a cardiopulmonary stress test (CPX). CPX testing is a non-invasive measurement of heart and lung function. It replaces a traditional treadmill stress test. This type of test provides a tremendous amount of information that relates not only to your present condition but also for future outcomes. This test combines measurements of you ventilation, respiratory gas exchange in the lungs, electrocardiogram (EKG), blood pressure and physical response before, during, and following an exercise protocol.  Follow-Up:  WITH DR Arman Filter  AS SOON AS POSSIBLE NEXT AVAILABLE  Remote monitoring is used to monitor your Pacemaker of ICD from home. This monitoring reduces the number of office visits required to check your device to one time per year. It allows [place] to keep an eye on the functioning of your device to ensure it is working properly. You are scheduled for a device check from home on .12 /15 /16 You may send your transmission at any time that day. If you have a wireless device, the transmission will be sent automatically. After your physician reviews your transmission, you will receive a postcard with your next transmission date.  Your physician wants you to follow-up in:  [place] will receive a reminder letter in the mail two months in advance. If you don't receive a letter, please call our office to schedule the follow-up appointment.      Any Other Special Instructions Will Be Listed Below (If Applicable).

## 2015-07-16 ENCOUNTER — Ambulatory Visit (HOSPITAL_COMMUNITY): Payer: PRIVATE HEALTH INSURANCE | Attending: Nurse Practitioner

## 2015-07-16 ENCOUNTER — Telehealth (HOSPITAL_COMMUNITY): Payer: Self-pay | Admitting: Cardiology

## 2015-07-16 DIAGNOSIS — I519 Heart disease, unspecified: Secondary | ICD-10-CM | POA: Insufficient documentation

## 2015-07-16 NOTE — Telephone Encounter (Signed)
Pt scheduled for CPX on 07/16/15 as ordered by [place] code- [phone] With patients current insurance- [employer] No pre cert required

## 2015-07-17 ENCOUNTER — Telehealth: Payer: Self-pay

## 2015-07-17 ENCOUNTER — Ambulatory Visit (HOSPITAL_COMMUNITY)
Admission: RE | Admit: 2015-07-17 | Discharge: 2015-07-17 | Disposition: A | Discharge status: Home / Self Care | Payer: PRIVATE HEALTH INSURANCE | Source: Ambulatory Visit | Attending: Cardiology | Admitting: Cardiology

## 2015-07-17 ENCOUNTER — Encounter (HOSPITAL_COMMUNITY): Payer: Self-pay

## 2015-07-17 VITALS — BP 98/58 | HR 86 | Wt 248.5 lb

## 2015-07-17 DIAGNOSIS — I10 Essential (primary) hypertension: Secondary | ICD-10-CM | POA: Diagnosis not present

## 2015-07-17 DIAGNOSIS — Z7982 Long term (current) use of aspirin: Secondary | ICD-10-CM | POA: Insufficient documentation

## 2015-07-17 DIAGNOSIS — I428 Other cardiomyopathies: Secondary | ICD-10-CM | POA: Insufficient documentation

## 2015-07-17 DIAGNOSIS — I480 Paroxysmal atrial fibrillation: Secondary | ICD-10-CM | POA: Insufficient documentation

## 2015-07-17 DIAGNOSIS — F329 Major depressive disorder, single episode, unspecified: Secondary | ICD-10-CM | POA: Insufficient documentation

## 2015-07-17 DIAGNOSIS — E785 Hyperlipidemia, unspecified: Secondary | ICD-10-CM | POA: Diagnosis not present

## 2015-07-17 DIAGNOSIS — I7100 Dissection of unspecified site of aorta: Secondary | ICD-10-CM | POA: Diagnosis not present

## 2015-07-17 DIAGNOSIS — Z853 Personal history of malignant neoplasm of breast: Secondary | ICD-10-CM | POA: Insufficient documentation

## 2015-07-17 DIAGNOSIS — Z9221 Personal history of antineoplastic chemotherapy: Secondary | ICD-10-CM | POA: Insufficient documentation

## 2015-07-17 DIAGNOSIS — Z95810 Presence of automatic (implantable) cardiac defibrillator: Secondary | ICD-10-CM | POA: Insufficient documentation

## 2015-07-17 DIAGNOSIS — Z86718 Personal history of other venous thrombosis and embolism: Secondary | ICD-10-CM | POA: Diagnosis not present

## 2015-07-17 DIAGNOSIS — K219 Gastro-esophageal reflux disease without esophagitis: Secondary | ICD-10-CM | POA: Insufficient documentation

## 2015-07-17 DIAGNOSIS — Z923 Personal history of irradiation: Secondary | ICD-10-CM | POA: Diagnosis not present

## 2015-07-17 DIAGNOSIS — I5022 Chronic systolic (congestive) heart failure: Secondary | ICD-10-CM | POA: Diagnosis not present

## 2015-07-17 DIAGNOSIS — Z79899 Other long term (current) drug therapy: Secondary | ICD-10-CM | POA: Diagnosis not present

## 2015-07-17 MED ORDER — IVABRADINE HCL 5 MG PO TABS: 5.0000 mg | ORAL_TABLET | Freq: Two times a day (BID) | ORAL | Status: DC

## 2015-07-17 NOTE — Patient Instructions (Signed)
START [name] 5mg  (1 tablet) twice a day.  FOLLOW UP: December with Echo. Your provider has scheduled you for an Echocardiogram An echocardiogram, or echocardiography, uses sound waves (ultrasound) to produce an image of your heart. The echocardiogram is simple, painless, obtained within a short period of time, and offers valuable information to your health care provider. The images from an echocardiogram can provide information such as:  Evidence of coronary artery disease (CAD).  Heart size.  Heart muscle function.  Heart valve function.  Aneurysm detection.  Evidence of a past heart attack.  Fluid buildup around the heart.  Heart muscle thickening.  Assess heart valve function. LET [facility] CARE PROVIDER KNOW ABOUT:  Any allergies you have.  All medicines you are taking, including vitamins, herbs, eye drops, creams, and over-the-counter medicines.  Previous problems you or members of your family have had with the use of anesthetics.  Any blood disorders you have.  Previous surgeries you have had.  Medical conditions you have.  Possibility of pregnancy, if this applies. BEFORE THE PROCEDURE  No special preparation is needed. Eat and drink normally.  PROCEDURE   In order to produce an image of your heart, gel will be applied to your chest and a wand-like tool (transducer) will be moved over your chest. The gel will help transmit the sound waves from the transducer. The sound waves will harmlessly bounce off your heart to allow the heart images to be captured in real-time motion. These images will then be recorded.  You may need an IV to receive a medicine that improves the quality of the pictures. AFTER THE PROCEDURE You may return to your normal schedule including diet, activities, and medicines, unless your health care provider tells you otherwise.

## 2015-07-17 NOTE — Telephone Encounter (Signed)
Prior auth for [name] 5 mg tablets sent via Cover My Meds.

## 2015-07-17 NOTE — Progress Notes (Signed)
Advanced Heart Failure Medication Review by a Pharmacist  Does the patient  feel that his/her medications are working for him/her?  yes  Has the patient been experiencing any side effects to the medications prescribed?  no  Does the patient measure his/her own blood pressure or blood glucose at home?  no   Does the patient have any problems obtaining medications due to transportation or finances?   no  Understanding of regimen: good Understanding of indications: good Potential of compliance: good    Pharmacist comments:  [name] is a pleasant 65 yo F presenting with her husband and without a medication list. She has a good understanding of her regimen and reports great compliance with all of her medications. She does report having to take an additional 20 mg lasix most Thursdays and some Saturdays since she goes to meetings and her grandsons soccer games and notices increased lower extremity edema afterwards. She did not have any medication-related questions or concerns for me at this time.   [name]. [name], PharmD, BCPS, CPP Clinical Pharmacist Pager: [phone] Phone: [phone] 07/17/2015 10:29 AM

## 2015-07-18 NOTE — Progress Notes (Signed)
Patient ID: [name], female   DOB: [birth year], 64 y.o.   MRN: [id number] Oncologist: Dr. Philipp Ovens at [facility] EP: Dr. [name]  Dr. Bea Graff in [place] Weight Range   220-224 pounds  Baseline proBNP     HPI: [name] is a 64 y.o. female a cardiac history that includes HTN, Type B aortic dissection (2010) managed with medical therapy by Dr. Prescott Gum and has healed, LBBB, systolic heart failure due to NICM with EF 10-15% in May 2013 which has decreased from 65% in 2010.  Cath Dec 2011 showed normal arteries.  She was diagnosed with breast cancer in 2009 s/p lumpectomy, XRT, and chemotherapy.  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   She underwent St Jude Bi-V ICD implantation Sept 2012 but unable to place LV lead.  She eventually brought back in Nov 2012 for left thoracotomy and placement of epicardial LV lead placement by Dr. Prescott Gum.  She also had a sleep study in Jan that showed mild OSA.  No clinical intervention occurred.    She is generally doing well.  She uses a rolling walker due to back and knee pain.  This limits her more than exertional dyspnea.  She has not exertional dyspnea with walking short distances, which is generally all she does.  Rare lightheadedness.  No chest pain.  No orthopnea/PND.  BP is soft.  Interrogation of device has shown atrial tachycardia but no atrial fibrillation.    Corevue was assessed.  Thoracic impedance stable without evidence for current volume overload.    10/10/2012 CPX Peak VO2: 10.6 % predicted peak VO2: 64.4% VE/VCO2 slope: 26.9 OUES: 1.18 Peak RER: 1.29 Ventilatory Threshold: 7.3 % predicted peak VO2: 44.4% Peak RR 23 Peak Ventilation: 29.8 VE/MVV: 32.6% PETCO2 at peak: 43  01/2014 CPX Peak VO2: 10.3 (66.4% predicted peak VO2) VE/VCO2 slope: 25.0 OUES: 1.30 Peak RER: 1.20  CPX (8/16): Peak VO2 10.1 VE/VCO2 slope 28.4 RER 1.14 OUES 1.3 PFTs mildly  restrictive No significant change compared to prior.  Limited more by obesity and restrictive lung physiology than heart (mild circulatory limitation).   ECHO 01/02/13 EF 69% Grade 1 diastolic dysfunction. RV ok. No PAH ECHO 07/10/13 EF 20% RV ok ECHO 10/13/14 EF 15-20% RV ok.   Labs 06/01/13 Creatinine 1.2 Potassium 4.4 Labs 12/14 K 3.9, creatinine 1.2 Labs 01/17/14 K 4.5 Creatinine 1.15  Labs 10/15 K 4.5, creatinine 2.3 Labs 08/18/14 K 4.4 Creatinine 1.24  Labs 08/28/14 Creatinine 1.12  Labs 8/16 K 4, creatinine 1.0, HCT 40.9   ROS: All systems negative except as listed in HPI, PMH and Problem List.  Past Medical History  Diagnosis Date  . Nonischemic cardiomyopathy     Admission 12/11 with EF 25%, normal coronary arteries by cath 12/11; NICM presumed to be secondary to chemotherapy for breast cancer  . DVT (deep venous thrombosis) 10/18/2010  . Obesity   . 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 Glaucoma   . Depression   . Breast cancer 07/2009    Stage 3 lumpectomy, radiation, chemotherapy; finished chemo October, felt to be in remission  . History of shingles   . Hyperlipidemia   . Chronic systolic dysfunction of left ventricle   . Left bundle branch block   . Cardiac defibrillator in place     St. Jude (JA) 9/12; LV lead placement unsuccessful;  s/p left thoracotomy with placement of epicardial LV lead 09/2011 (Dr.  PVT)  . [name]     with exertion  . Hypertension   . CHF (congestive heart failure)   . GERD (gastroesophageal reflux disease)   . Arthritis     lower back  . Hiatal hernia     small hiatal hernia  . Atrial fibrillation     post op (left thoracotomy) 11/12 req amio/DCCV    Current Outpatient Prescriptions  Medication Sig Dispense Refill  . albuterol (PROVENTIL HFA;VENTOLIN HFA) 108 (90 BASE) MCG/ACT inhaler Inhale 2 puffs into the lungs every 4 (four) hours as needed. [name]     . albuterol (PROVENTIL) (2.5 MG/3ML) 0.083%  nebulizer solution Take 2.5 mg by nebulization every 4 (four) hours as needed. [name]     . allopurinol (ZYLOPRIM) 100 MG tablet Take 100 mg by mouth daily.     [name] ALPRAZolam (XANAX) 1 MG tablet Take 0.5 mg by mouth daily as needed for anxiety. Anxiety     . aspirin 81 MG EC tablet Take 81 mg by mouth daily.      . Bepotastine Besilate (BEPREVE) 1.5 % SOLN Place 1 drop into both eyes daily as needed. Dry eyes     . bimatoprost (LUMIGAN) 0.03 % ophthalmic solution Place 1 drop into both eyes at bedtime.      . Calcium Carbonate-Vitamin D (CALCIUM-VITAMIN D3) 600-200 MG-UNIT TABS Take 1 tablet by mouth 2 (two) times daily.      . carvedilol (COREG) 12.5 MG tablet Take 1 tablet (12.5 mg total) by mouth 2 (two) times daily with a meal. 225 tablet 3  . cholecalciferol (VITAMIN D) 1000 UNITS tablet Take 1,000 Units by mouth 2 (two) times daily.      . cyclobenzaprine (FLEXERIL) 10 MG tablet Take 1 tablet (10 mg total) by mouth 3 (three) times daily as needed for muscle spasms. 30 tablet 0  . diclofenac sodium (VOLTAREN) 1 % GEL Apply 1 g topically daily as needed. (PAIN)    . fexofenadine (ALLEGRA) 180 MG tablet Take 180 mg by mouth daily as needed. Allergies     . fluticasone (FLONASE) 50 MCG/ACT nasal spray Place 2 sprays into the nose daily as needed. Allergies     . furosemide (LASIX) 40 MG tablet Take 1 tablet (40 mg total) by mouth daily. 180 tablet 1  . letrozole (FEMARA) 2.5 MG tablet Take 2.5 mg by mouth every evening.      . loratadine (CLARITIN) 10 MG tablet Take 10 mg by mouth daily as needed. For allergies     . montelukast (SINGULAIR) 10 MG tablet Take 10 mg by mouth at bedtime. For allergies & congestion    . Multiple Vitamin (MULTIVITAMIN) tablet Take 1 tablet by mouth daily.      [name] 20 MG capsule Take 20 mg by mouth 2 (two) times daily.      [name] PARoxetine (PAXIL) 10 MG tablet Take 10 mg by mouth every morning.     [name] Glycol-Propyl Glycol  (SYSTANE OP) Apply 1 drop to eye daily as needed (dry eyes).     [name] senna-docusate (SENOKOT-S) 8.6-50 MG per tablet Take 1 tablet by mouth daily.      [name] spironolactone (ALDACTONE) 25 MG tablet Take 1 tablet (25 mg total) by mouth daily. 90 tablet 3  . traMADol (ULTRAM) 50 MG tablet Take 50 mg by mouth every 6 (six) hours as needed for pain.    . valsartan (DIOVAN) 40 MG tablet Take  1 tablet (40 mg total) by mouth at bedtime. 60 tablet 6  . colchicine 0.6 MG tablet Take 0.6 mg by mouth daily as needed. (GOUT)  0  . ivabradine ([name]) 5 MG TABS tablet Take 1 tablet (5 mg total) by mouth 2 (two) times daily with a meal. 60 tablet 3  . [DISCONTINUED] ranitidine (ZANTAC) 300 MG capsule Take 300 mg by mouth daily as needed. Acid indigestion     No current facility-administered medications for this encounter.     PHYSICAL EXAM: Filed Vitals:   07/17/15 1013  BP: 98/58  Pulse: 86  Weight: 248 lb 8 oz (112.719 kg)  SpO2: 97%    General:  Well appearing. No resp difficulty Husband present HEENT: normal Neck: supple. JVP difficult to assess due to body habitus but does not appear elevated. Carotids 2+ bilaterally; no bruits. No lymphadenopathy or thryomegaly appreciated. Cor: PMI normal. Regular rate & rhythm. No rubs, or murmurs. No S3.  Lungs: clear Abdomen: obese, soft, nontender, nondistended. No hepatosplenomegaly. No bruits or masses. Good bowel sounds. Extremities: no cyanosis, clubbing, rash.  Trace ankle edema.   Neuro: alert & orientedx3, cranial nerves grossly intact. Moves all 4 extremities w/o difficulty. Affect pleasant.  ASSESSMENT & PLAN: 1. Chronic systolic CHF: Nonischemic cardiomyopathy, EF 15-20%. S/p St Jude Bi-V ICD with epicardial LV lead. CPX in 8/16 was similar to the past: it appeared that the major limitation was from body habitus/deconditioning and not from her heart. NYHA class II-III symptoms.  She is not volume overloaded on exam.   - Continue lasix 40 mg daily.     - Continue Coreg to 12.5 mg bid  - Continue  40 mg valsartan in the evening only, no BP room to increase.  - Continue spironolactone 25 daily.  - HR remains elevated, will add [name] 5 mg bid.  - Echo in 3 months.   2. Paroxysmal atrial fibrillation: Only 1 episode associated with stressor in past.  If recurrence is noted, will need anticoagulation. On aspirin 81 mg daily.  Most recent device interrogation showed no atrial fibrillation.  3. Type B aortic dissection: This has been stable.  BP is not elevated.    Follow up in 8 weeks.   Loralie Champagne  07/18/2015

## 2015-07-20 NOTE — Telephone Encounter (Signed)
Prior auth for [employer] 5mg  sent to [place] via Cover My Meds.

## 2015-07-27 LAB — CUP PACEART INCLINIC DEVICE CHECK
MDC IDC PG SERIAL: 7004282
MDC IDC SESS DTM: 20160926073432

## 2015-08-04 ENCOUNTER — Encounter: Payer: Self-pay | Admitting: Internal Medicine

## 2015-08-06 ENCOUNTER — Telehealth (HOSPITAL_COMMUNITY): Payer: Self-pay | Admitting: Vascular Surgery

## 2015-08-06 NOTE — Telephone Encounter (Signed)
Pt called she states she called left a message yesterday no one call her back, she needs to speak to  someone about medication and she needs samples of Colanor today.. Please advise

## 2015-08-06 NOTE — Telephone Encounter (Signed)
[name] was denied, will send an appeal, pt is aware and will come by for samples today  Medication Samples have been provided to the patient.  Drug name: [name] 5 mg  Qty: 3  LOT: [id number]  Exp.Date: 9/17  The patient has been instructed regarding the correct time, dose, and frequency of taking this medication, including desired effects and most common side effects.   [name] 11:51 AM 08/06/2015

## 2015-08-13 ENCOUNTER — Telehealth (HOSPITAL_COMMUNITY): Payer: Self-pay | Admitting: *Deleted

## 2015-08-13 NOTE — Telephone Encounter (Signed)
Appeal completed for pt's [name] and medication was approved through 08/09/16, left mess for pt that it was approved

## 2015-08-26 ENCOUNTER — Encounter: Payer: Self-pay | Admitting: Internal Medicine

## 2015-09-22 ENCOUNTER — Other Ambulatory Visit (HOSPITAL_COMMUNITY): Payer: Self-pay | Admitting: Internal Medicine

## 2015-09-23 ENCOUNTER — Other Ambulatory Visit: Payer: Self-pay | Admitting: Internal Medicine

## 2015-10-13 ENCOUNTER — Ambulatory Visit (HOSPITAL_COMMUNITY)
Admission: RE | Admit: 2015-10-13 | Discharge: 2015-10-13 | Disposition: A | Discharge status: Home / Self Care | Payer: PRIVATE HEALTH INSURANCE | Source: Ambulatory Visit | Attending: Internal Medicine | Admitting: Internal Medicine

## 2015-10-13 ENCOUNTER — Ambulatory Visit (HOSPITAL_BASED_OUTPATIENT_CLINIC_OR_DEPARTMENT_OTHER)
Admission: RE | Admit: 2015-10-13 | Discharge: 2015-10-13 | Disposition: A | Discharge status: Home / Self Care | Payer: PRIVATE HEALTH INSURANCE | Source: Ambulatory Visit | Attending: Internal Medicine | Admitting: Internal Medicine

## 2015-10-13 VITALS — BP 108/68 | HR 75 | Wt 246.0 lb

## 2015-10-13 VITALS — BP 105/71 | HR 74 | Resp 18

## 2015-10-13 DIAGNOSIS — I517 Cardiomegaly: Secondary | ICD-10-CM | POA: Diagnosis not present

## 2015-10-13 DIAGNOSIS — I480 Paroxysmal atrial fibrillation: Secondary | ICD-10-CM | POA: Diagnosis not present

## 2015-10-13 DIAGNOSIS — I5022 Chronic systolic (congestive) heart failure: Secondary | ICD-10-CM

## 2015-10-13 MED ORDER — PERFLUTREN LIPID MICROSPHERE
INTRAVENOUS | Status: AC
  Filled 2015-10-13: qty 10

## 2015-10-13 MED ORDER — PERFLUTREN LIPID MICROSPHERE
1.0000 mL | INTRAVENOUS | Status: AC | PRN
  Administered 2015-10-13: 4 mL via INTRAVENOUS
  Filled 2015-10-13: qty 10

## 2015-10-13 MED ORDER — IVABRADINE HCL 7.5 MG PO TABS: 7.5000 mg | ORAL_TABLET | Freq: Two times a day (BID) | ORAL | Status: DC

## 2015-10-13 NOTE — Progress Notes (Signed)
ADVANCED HF CLINIC NOTE   Patient ID: [name], female   DOB: [birth year], 64 y.o.   MRN: [id number] Oncologist: Dr. Philipp Ovens at [facility] EP: Dr. [name] Dr. Bea Graff in [place] HF: [place]  Weight Range   220-224 pounds  Baseline proBNP     HPI: [name] is a 64 y.o. female a cardiac history that includes HTN, Type B aortic dissection (2010) managed with medical therapy by Dr. Prescott Gum and has healed, LBBB, systolic heart failure due to NICM with EF 10-15% in May 2013 which has decreased from 65% in 2010.  Cath Dec 2011 showed normal arteries.  She was diagnosed with breast cancer in 2009 s/p lumpectomy, XRT, and chemotherapy.  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   She underwent St Jude Bi-V ICD implantation Sept 2012 but unable to place LV lead.  She eventually brought back in Nov 2012 for left thoracotomy and placement of epicardial LV lead placement by Dr. Prescott Gum.  She also had a sleep study in Jan that showed mild OSA.  No clinical intervention occurred.    Here for f/u. She is generally doing well. [facility] [place] [facility]. Has been having trouble with her knees and getting shots. Also had a fall. No longer using a rolling walker. Used a wheelchair when she went to [place] over Thanksgiving.  Recently started on [name] 5 bid. HR in 70s  No chest pain.  No orthopnea/PND.  BP is soft typically in 90s but ranges 82-108. Had a few days where she was orthostatic but now better. Takes 1 lasix a day. On Friday or Saturday will take extra 1/2 tablet if weight up.    Corevue was assessed.  Thoracic impedance stable without evidence for current volume overload.    10/10/2012 CPX Peak VO2: 10.6 % predicted peak VO2: 64.4% VE/VCO2 slope: 26.9 OUES: 1.18 Peak RER: 1.29 Ventilatory Threshold: 7.3 % predicted peak VO2: 44.4% Peak RR 23 Peak Ventilation: 29.8 VE/MVV: 32.6% PETCO2 at  peak: 43  01/2014 CPX Peak VO2: 10.3 (66.4% predicted peak VO2) VE/VCO2 slope: 25.0 OUES: 1.30 Peak RER: 1.20  CPX (8/16): Peak VO2 10.1 VE/VCO2 slope 28.4 RER 1.14 OUES 1.3 PFTs mildly restrictive No significant change compared to prior.  Limited more by obesity and restrictive lung physiology than heart (mild circulatory limitation).   ECHO 01/02/13 EF 0000000 Grade 1 diastolic dysfunction. RV ok. No PAH ECHO 07/10/13 EF 20% RV ok ECHO 10/13/14 EF 15-20% RV ok.   Echo reviewed personally today 15% RV normal  Labs 06/01/13 Creatinine 1.2 Potassium 4.4 Labs 12/14 K 3.9, creatinine 1.2 Labs 01/17/14 K 4.5 Creatinine 1.15  Labs 10/15 K 4.5, creatinine 2.3 Labs 08/18/14 K 4.4 Creatinine 1.24  Labs 08/28/14 Creatinine 1.12  Labs 8/16 K 4, creatinine 1.0, HCT 40.9 Labs 09/21/15  K 3.6 creatinine 1.1  ROS: All systems negative except as listed in HPI, PMH and Problem List.  Past Medical History  Diagnosis Date  . Nonischemic cardiomyopathy ([place])     Admission 12/11 with EF 25%, normal coronary arteries by cath 12/11; NICM presumed to be secondary to chemotherapy for breast cancer  . DVT (deep venous thrombosis) 10/18/2010  . Obesity   . 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 Glaucoma   . Depression   . Breast cancer 07/2009    Stage 3 lumpectomy, radiation, chemotherapy; finished chemo October, felt to  be in remission  . History of shingles   . Hyperlipidemia   . Chronic systolic dysfunction of left ventricle   . Left bundle branch block   . Cardiac defibrillator in place     St. Jude (JA) 9/12; LV lead placement unsuccessful;  s/p left thoracotomy with placement of epicardial LV lead 09/2011 (Dr. PVT)  . [name]     with exertion  . Hypertension   . CHF (congestive heart failure)   . GERD (gastroesophageal reflux disease)   . Arthritis     lower back  . Hiatal hernia     small hiatal hernia  . Atrial fibrillation     post op (left thoracotomy) 11/12 req  amio/DCCV    Current Outpatient Prescriptions  Medication Sig Dispense Refill  . albuterol (PROVENTIL HFA;VENTOLIN HFA) 108 (90 BASE) MCG/ACT inhaler Inhale 2 puffs into the lungs every 4 (four) hours as needed. [name]     . albuterol (PROVENTIL) (2.5 MG/3ML) 0.083% nebulizer solution Take 2.5 mg by nebulization every 4 (four) hours as needed. [name]     . allopurinol (ZYLOPRIM) 100 MG tablet Take 100 mg by mouth daily.     [name] ALPRAZolam (XANAX) 1 MG tablet Take 0.5 mg by mouth daily as needed for anxiety. Anxiety     . aspirin 81 MG EC tablet Take 81 mg by mouth daily.      . Bepotastine Besilate (BEPREVE) 1.5 % SOLN Place 1 drop into both eyes daily as needed. Dry eyes     . bimatoprost (LUMIGAN) 0.03 % ophthalmic solution Place 1 drop into both eyes at bedtime.      . Biotin 1 MG CAPS Take 1 mg by mouth daily.    . Calcium Carbonate-Vitamin D (CALCIUM-VITAMIN D3) 600-200 MG-UNIT TABS Take 1 tablet by mouth 2 (two) times daily.      . carvedilol (COREG) 12.5 MG tablet Take 1 tablet (12.5 mg total) by mouth 2 (two) times daily with a meal. 225 tablet 3  . cholecalciferol (VITAMIN D) 1000 UNITS tablet Take 1,000 Units by mouth 2 (two) times daily.      . colchicine 0.6 MG tablet Take 0.6 mg by mouth daily as needed. (GOUT)  0  . cyclobenzaprine (FLEXERIL) 10 MG tablet Take 1 tablet (10 mg total) by mouth 3 (three) times daily as needed for muscle spasms. 30 tablet 0  . diclofenac sodium (VOLTAREN) 1 % GEL Apply 1 g topically daily as needed. (PAIN)    . docusate sodium (COLACE) 100 MG capsule Take 100 mg by mouth 2 (two) times daily.    . fexofenadine (ALLEGRA) 180 MG tablet Take 180 mg by mouth daily as needed. Allergies     . fluticasone (FLONASE) 50 MCG/ACT nasal spray Place 2 sprays into the nose daily as needed. Allergies     . furosemide (LASIX) 40 MG tablet Take 1 tablet (40 mg total) by mouth daily. 180 tablet 1  . ivabradine ([name]) 5 MG TABS tablet  Take 1 tablet (5 mg total) by mouth 2 (two) times daily with a meal. 60 tablet 3  . letrozole (FEMARA) 2.5 MG tablet Take 2.5 mg by mouth every evening.      . loratadine (CLARITIN) 10 MG tablet Take 10 mg by mouth daily as needed. For allergies     . montelukast (SINGULAIR) 10 MG tablet Take 10 mg by mouth at bedtime. For allergies & congestion    . Multiple Vitamin (MULTIVITAMIN)  tablet Take 1 tablet by mouth daily.      [name] 20 MG capsule Take 20 mg by mouth 2 (two) times daily.      [name] PARoxetine (PAXIL) 10 MG tablet Take 10 mg by mouth every morning.     [name] Glycol-Propyl Glycol (SYSTANE OP) Apply 1 drop to eye daily as needed (dry eyes).     [name] senna-docusate (SENOKOT-S) 8.6-50 MG per tablet Take 1 tablet by mouth daily.      [name] spironolactone (ALDACTONE) 25 MG tablet Take 1 tablet (25 mg total) by mouth daily. 90 tablet 3  . traMADol (ULTRAM) 50 MG tablet Take 50 mg by mouth every 6 (six) hours as needed for pain.    . valsartan (DIOVAN) 40 MG tablet Take 1 tablet (40 mg total) by mouth at bedtime. 60 tablet 6  . [DISCONTINUED] ranitidine (ZANTAC) 300 MG capsule Take 300 mg by mouth daily as needed. Acid indigestion     No current facility-administered medications for this encounter.   Facility-Administered Medications Ordered in Other Encounters  Medication Dose Route Frequency Provider Last Rate Last Dose  . perflutren lipid microspheres (DEFINITY) IV suspension  1-10 mL Intravenous PRN Jolaine Artist, MD   4 mL at 10/13/15 1104     PHYSICAL EXAM: Filed Vitals:   10/13/15 1138  BP: 108/68  Pulse: 75  Weight: 246 lb (111.585 kg)  SpO2: 97%    General:  Well appearing. No resp difficulty Husband present HEENT: normal Neck: supple. JVP difficult to assess due to body habitus but does not appear elevated. Carotids 2+ bilaterally; no bruits. No lymphadenopathy or thryomegaly appreciated. Cor: PMI normal. Regular rate & rhythm. No rubs, or murmurs. No  S3.  Lungs: clear Abdomen: obese, soft, nontender, nondistended. No hepatosplenomegaly. No bruits or masses. Good bowel sounds. Extremities: no cyanosis, clubbing, rash.  Trace ankle edema.   Neuro: alert & orientedx3, cranial nerves grossly intact. Moves all 4 extremities w/o difficulty. Affect pleasant.  ASSESSMENT & PLAN: 1. Chronic systolic CHF: Nonischemic cardiomyopathy. Echo today EF 15%. RV normal. S/p St Jude Bi-V ICD with epicardial LV lead. CPX in 8/16 was similar to the past: it appeared that the major limitation was from body habitus/deconditioning and not from her heart. NYHA class II-III symptoms.  She is not volume overloaded on exam.   - Continue lasix 40 mg daily.    - Continue Coreg to 12.5 mg bid  - Continue  40 mg valsartan in the evening only, no BP room to increase currently may try to increase night dose at next visit.  - Continue spironolactone 25 daily.  - HR remains elevated, will increase [name] to 7. 5 mg bid.  2. Paroxysmal atrial fibrillation: Only 1 episode associated with stressor in past.  If recurrence is noted, will need anticoagulation. On aspirin 81 mg daily.  Most recent device interrogation in EP Clinic showed atrial tach and no atrial fibrillation. If has recurrent AF will need anticoagulation. CHADVSVASC 3 3. Type B aortic dissection: This has been stable.  BP is not elevated.    Continue to be stable with NYHA II-III symptoms despite severe LV dysfunction. Continue to follow closely. May need VAD evaluation at some point. If symptoms are worse will repeat CPX.   Glori Bickers MD 10/13/2015

## 2015-10-13 NOTE — Addendum Note (Signed)
Encounter addended by: Scarlette Calico, RN on: 10/13/2015 12:50 PM<BR>     Documentation filed: Patient Instructions Section, Orders

## 2015-10-13 NOTE — Patient Instructions (Signed)
Increase [name] to 7.5 mg Twice daily  We will contact you in 3 months to schedule your next appointment.

## 2015-10-13 NOTE — Progress Notes (Signed)
  Echocardiogram 2D Echocardiogram has been performed.  [name] 10/13/2015, 11:26 AM

## 2015-10-15 ENCOUNTER — Ambulatory Visit (INDEPENDENT_AMBULATORY_CARE_PROVIDER_SITE_OTHER): Payer: PRIVATE HEALTH INSURANCE | Admitting: *Deleted

## 2015-10-15 DIAGNOSIS — I5022 Chronic systolic (congestive) heart failure: Secondary | ICD-10-CM

## 2015-10-15 DIAGNOSIS — I429 Cardiomyopathy, unspecified: Secondary | ICD-10-CM | POA: Diagnosis not present

## 2015-10-15 DIAGNOSIS — I428 Other cardiomyopathies: Secondary | ICD-10-CM

## 2015-10-15 LAB — CUP PACEART REMOTE DEVICE CHECK
Battery Remaining Percentage: 7 %
Battery Voltage: 2.65 V
Brady Statistic AP VP Percent: 95 %
Brady Statistic AS VP Percent: 5.1 %
Brady Statistic RA Percent Paced: 95 %
Date Time Interrogation Session: 20161215084022
HIGH POWER IMPEDANCE MEASURED VALUE: 84 Ohm
HighPow Impedance: 84 Ohm
Implantable Lead Implant Date: 20120914
Implantable Lead Implant Date: 20121116
Implantable Lead Model: 511212
Implantable Lead Serial Number: 194059
Lead Channel Sensing Intrinsic Amplitude: 1.2 mV
Lead Channel Sensing Intrinsic Amplitude: 12 mV
Lead Channel Setting Pacing Amplitude: 2 V
Lead Channel Setting Pacing Amplitude: 3.5 V
Lead Channel Setting Pacing Pulse Width: 1 ms
MDC IDC LEAD IMPLANT DT: 20120914
MDC IDC LEAD LOCATION: 753858
MDC IDC LEAD LOCATION: 753859
MDC IDC LEAD LOCATION: 753860
MDC IDC MSMT BATTERY REMAINING LONGEVITY: 3 mo
MDC IDC MSMT LEADCHNL LV IMPEDANCE VALUE: 300 Ohm
MDC IDC MSMT LEADCHNL RA IMPEDANCE VALUE: 380 Ohm
MDC IDC MSMT LEADCHNL RV IMPEDANCE VALUE: 430 Ohm
MDC IDC PG SERIAL: 7004282
MDC IDC SET LEADCHNL RV PACING AMPLITUDE: 2.5 V
MDC IDC SET LEADCHNL RV PACING PULSEWIDTH: 0.8 ms
MDC IDC SET LEADCHNL RV SENSING SENSITIVITY: 0.5 mV
MDC IDC STAT BRADY AP VS PERCENT: 1 %
MDC IDC STAT BRADY AS VS PERCENT: 1 %

## 2015-10-15 NOTE — Progress Notes (Signed)
Remote ICD transmission.   

## 2015-10-20 ENCOUNTER — Other Ambulatory Visit: Payer: Self-pay | Admitting: Internal Medicine

## 2015-10-29 ENCOUNTER — Encounter: Payer: Self-pay | Admitting: Cardiology

## 2015-11-16 ENCOUNTER — Ambulatory Visit (INDEPENDENT_AMBULATORY_CARE_PROVIDER_SITE_OTHER): Payer: PRIVATE HEALTH INSURANCE | Admitting: *Deleted

## 2015-11-16 DIAGNOSIS — Z95810 Presence of automatic (implantable) cardiac defibrillator: Secondary | ICD-10-CM

## 2015-11-16 NOTE — Progress Notes (Signed)
Remote ICD transmission.   

## 2015-11-18 ENCOUNTER — Other Ambulatory Visit (HOSPITAL_COMMUNITY): Payer: Self-pay | Admitting: Internal Medicine

## 2015-11-20 LAB — CUP PACEART REMOTE DEVICE CHECK
Battery Voltage: 2.62 V
Brady Statistic AP VP Percent: 95 %
Brady Statistic AP VS Percent: 1 %
Brady Statistic AS VP Percent: 4.8 %
Brady Statistic RA Percent Paced: 95 %
Date Time Interrogation Session: 20170116070016
HighPow Impedance: 93 Ohm
HighPow Impedance: 93 Ohm
Implantable Lead Location: 753858
Implantable Lead Location: 753859
Lead Channel Impedance Value: 380 Ohm
Lead Channel Sensing Intrinsic Amplitude: 11.9 mV
Lead Channel Setting Pacing Amplitude: 2.5 V
Lead Channel Setting Pacing Amplitude: 3.5 V
Lead Channel Setting Sensing Sensitivity: 0.5 mV
MDC IDC LEAD IMPLANT DT: 20120914
MDC IDC LEAD IMPLANT DT: 20120914
MDC IDC LEAD IMPLANT DT: 20121116
MDC IDC LEAD LOCATION: 753860
MDC IDC LEAD MODEL: 511212
MDC IDC LEAD SERIAL: 194059
MDC IDC MSMT BATTERY REMAINING LONGEVITY: 1 mo
MDC IDC MSMT BATTERY REMAINING PERCENTAGE: 4 %
MDC IDC MSMT LEADCHNL LV IMPEDANCE VALUE: 290 Ohm
MDC IDC MSMT LEADCHNL RA IMPEDANCE VALUE: 380 Ohm
MDC IDC MSMT LEADCHNL RA SENSING INTR AMPL: 0.5 mV
MDC IDC SET LEADCHNL LV PACING PULSEWIDTH: 1 ms
MDC IDC SET LEADCHNL RA PACING AMPLITUDE: 2 V
MDC IDC SET LEADCHNL RV PACING PULSEWIDTH: 0.8 ms
MDC IDC STAT BRADY AS VS PERCENT: 1 %
Pulse Gen Serial Number: 7004282

## 2015-12-13 ENCOUNTER — Telehealth: Payer: Self-pay | Admitting: Physician Assistant

## 2015-12-13 NOTE — Telephone Encounter (Signed)
[name] is a 65 year old female with history nonischemic cardiomyopathy, hyperlipidemia, hypertension, history of atrial fibrillation and s/p St. Jude BiV device placement in September 2012 with help of Dr. Prescott Gum. Patient has been followed by Dr. [name]. Patient contacted after hour cardiology and she service because her pacemaker vibrated for 5 seconds around 11 AM this morning. Otherwise patient denies any chest discomfort or shortness of breath. She does not have any symptom at this time. She is 3 hours away from home and can transmit another interrogation from home.  I told her her device likely reached ERI, she is scheduled to see Dr. [name] next Monday to discuss PPM device change out  Signed, Almyra Deforest PA Pager: [phone]

## 2015-12-14 ENCOUNTER — Telehealth: Payer: Self-pay | Admitting: Cardiology

## 2015-12-14 NOTE — Telephone Encounter (Signed)
Called pt and informed her that her device has reached ERI. Informed her that this means she has approximately 3 months remaining on her battery life. Pt agreed to see RU on 12-22-15 at 1:00 PM.

## 2015-12-17 ENCOUNTER — Ambulatory Visit (INDEPENDENT_AMBULATORY_CARE_PROVIDER_SITE_OTHER): Payer: PRIVATE HEALTH INSURANCE | Admitting: *Deleted

## 2015-12-17 DIAGNOSIS — Z95810 Presence of automatic (implantable) cardiac defibrillator: Secondary | ICD-10-CM

## 2015-12-17 LAB — CUP PACEART REMOTE DEVICE CHECK
Brady Statistic AP VP Percent: 95 %
Brady Statistic AP VS Percent: 1 %
Brady Statistic AS VP Percent: 4.5 %
Brady Statistic AS VS Percent: 1 %
Brady Statistic RA Percent Paced: 95 %
HighPow Impedance: 84 Ohm
HighPow Impedance: 84 Ohm
Implantable Lead Implant Date: 20120914
Implantable Lead Location: 753858
Implantable Lead Location: 753860
Lead Channel Impedance Value: 290 Ohm
Lead Channel Impedance Value: 380 Ohm
Lead Channel Pacing Threshold Amplitude: 1 V
Lead Channel Pacing Threshold Pulse Width: 0.5 ms
Lead Channel Pacing Threshold Pulse Width: 0.8 ms
Lead Channel Sensing Intrinsic Amplitude: 12 mV
Lead Channel Setting Pacing Amplitude: 2 V
Lead Channel Setting Pacing Amplitude: 3.5 V
Lead Channel Setting Pacing Pulse Width: 0.8 ms
MDC IDC LEAD IMPLANT DT: 20120914
MDC IDC LEAD IMPLANT DT: 20121116
MDC IDC LEAD LOCATION: 753859
MDC IDC LEAD MODEL: 511212
MDC IDC LEAD SERIAL: 194059
MDC IDC MSMT BATTERY REMAINING LONGEVITY: 0 mo
MDC IDC MSMT BATTERY VOLTAGE: 2.59 V
MDC IDC MSMT LEADCHNL LV PACING THRESHOLD AMPLITUDE: 1.5 V
MDC IDC MSMT LEADCHNL LV PACING THRESHOLD PULSEWIDTH: 1 ms
MDC IDC MSMT LEADCHNL RA PACING THRESHOLD AMPLITUDE: 0.75 V
MDC IDC MSMT LEADCHNL RA SENSING INTR AMPL: 0.8 mV
MDC IDC MSMT LEADCHNL RV IMPEDANCE VALUE: 360 Ohm
MDC IDC PG SERIAL: 7004282
MDC IDC SESS DTM: 20170216070033
MDC IDC SET LEADCHNL LV PACING PULSEWIDTH: 1 ms
MDC IDC SET LEADCHNL RV PACING AMPLITUDE: 2.5 V
MDC IDC SET LEADCHNL RV SENSING SENSITIVITY: 0.5 mV

## 2015-12-17 NOTE — Progress Notes (Signed)
Remote ICD transmission.   

## 2015-12-22 ENCOUNTER — Encounter: Payer: Self-pay | Admitting: *Deleted

## 2015-12-22 ENCOUNTER — Encounter: Payer: Self-pay | Admitting: Physician Assistant

## 2015-12-22 ENCOUNTER — Ambulatory Visit (INDEPENDENT_AMBULATORY_CARE_PROVIDER_SITE_OTHER): Payer: PRIVATE HEALTH INSURANCE | Admitting: Physician Assistant

## 2015-12-22 VITALS — BP 102/64 | HR 73 | Ht 62.0 in | Wt 248.0 lb

## 2015-12-22 DIAGNOSIS — I429 Cardiomyopathy, unspecified: Secondary | ICD-10-CM | POA: Diagnosis not present

## 2015-12-22 DIAGNOSIS — I480 Paroxysmal atrial fibrillation: Secondary | ICD-10-CM | POA: Diagnosis not present

## 2015-12-22 DIAGNOSIS — I10 Essential (primary) hypertension: Secondary | ICD-10-CM | POA: Diagnosis not present

## 2015-12-22 NOTE — Progress Notes (Addendum)
Electrophysiology Office Note Date: 12/22/2015  ID:  Abyan, Paustian 02/11/1951, MRN [id number]  PCP: [name], MD Primary Cardiologist: [name] Electrophysiologist: Allred  CC: device is at [facility] is a 65 y.o. female seen today for Dr [name].  She has PMHx of NICM/ICEF 10-15% in May 2013 which has decreased from 65% in 2010. Cath Dec 2011 showed normal arteries, Type B aortic dissection treated medically, LBBB, HTN, HLD, CHF, an event of post-op AF in 2012, breast cancer with lumpectomy chemo and Rad tx.  She presents today for routine electrophysiology follow up.  Since last being seen in our clinic, with [name] Sept 2016, the patient reports doing very well.   She denies chest pain, palpitations, PND, orthopnea, nausea, vomiting, dizziness, syncope, edema, weight gain, or early satiety.  She has not had ICD shocks. She takes prn extra Lasix based on weight and symptoms but has been stable for some time.  She is active 3-4 hours per day, though orthopedic issues are becoming a problem and limiting.   She reports labs are done routinely with her PMD Last noted 06/16/15, CBC/BMET look good, BUN/Creat 19/1.00, K+ 4.0    Device History: STJ CRTD implanted 2012 for NICM, LBBB, CHF (epicardial LV lead placed by [facility]CTS 2012) History of appropriate therapy: No She was shocked once post epicardial lead for RAFib, none since. There are 2 lifetime charges for the device, implant record shows DFT testing was done. History of AAD therapy: No  Past Medical History  Diagnosis Date  . Nonischemic cardiomyopathy ([place])     Admission 12/11 with EF 25%, normal coronary arteries by cath 12/11; NICM presumed to be secondary to chemotherapy for breast cancer  . DVT (deep venous thrombosis) ([place]) 10/18/2010  . Obesity   . Aortic dissection ([place]) 12/2008    Type B  . Glaucoma   . Depression   . Breast cancer ([place]) 07/2009    Stage 3 lumpectomy, radiation,  chemotherapy; finished chemo October, felt to be in remission  . History of shingles   . Hyperlipidemia   . Chronic systolic dysfunction of left ventricle   . Left bundle branch block   . Cardiac defibrillator in place     St. Jude (JA) 9/12; LV lead placement unsuccessful;  s/p left thoracotomy with placement of epicardial LV lead 09/2011 (Dr. PVT)  . [name]     with exertion  . Hypertension   . CHF (congestive heart failure) ([place])   . GERD (gastroesophageal reflux disease)   . Arthritis     lower back  . Hiatal hernia     small hiatal hernia  . Atrial fibrillation ([place])     post op (left thoracotomy) 11/12 req amio/DCCV   Past Surgical History  Procedure Laterality Date  . Tubal ligation      Bilateral  . Oophorectomy      Single  . Breast lumpectomy      Right breast  . Tonsillectomy    . Cardiac defibrillator placement    . Cardiac catheterization      2011  . Thoracotomy  09/16/2011    Procedure: THORACOTOMY MAJOR;  Surgeon: Tharon Aquas Adelene Idler, MD;  Location: [facility] OR;  Service: Thoracic;  Laterality: Left;  left anterolateral Thoracotomy for placement of St. Jude epicardial pacing lead     Current Outpatient Prescriptions  Medication Sig Dispense Refill  . albuterol (PROVENTIL HFA;VENTOLIN HFA) 108 (90 BASE) MCG/ACT inhaler Inhale 2  puffs into the lungs every 4 (four) hours as needed. [name]     . albuterol (PROVENTIL) (2.5 MG/3ML) 0.083% nebulizer solution Take 2.5 mg by nebulization every 4 (four) hours as needed. [name]     . allopurinol (ZYLOPRIM) 100 MG tablet Take 100 mg by mouth daily.     [name] ALPRAZolam (XANAX) 1 MG tablet Take 0.5 mg by mouth daily as needed for anxiety. Anxiety     . aspirin 81 MG EC tablet Take 81 mg by mouth daily.      [name] azelastine (OPTIVAR) 0.05 % ophthalmic solution Place 1 drop into both eyes daily.    . Bepotastine Besilate (BEPREVE) 1.5 % SOLN Place 1 drop into both eyes daily as needed. Dry  eyes     . bimatoprost (LUMIGAN) 0.03 % ophthalmic solution Place 1 drop into both eyes at bedtime.      . Calcium Carbonate-Vitamin D (CALCIUM-VITAMIN D3) 600-200 MG-UNIT TABS Take 1 tablet by mouth 2 (two) times daily.      . carvedilol (COREG) 12.5 MG tablet Take 1 tablet (12.5 mg total) by mouth 2 (two) times daily with a meal. 225 tablet 3  . cholecalciferol (VITAMIN D) 1000 UNITS tablet Take 1,000 Units by mouth 2 (two) times daily.      . colchicine 0.6 MG tablet Take 0.6 mg by mouth daily as needed. (GOUT)  0  . [name] 7.5 MG TABS tablet TAKE ONE TABLET TWICE DAILY WITH A MEAL 60 tablet 3  . cyclobenzaprine (FLEXERIL) 10 MG tablet Take 1 tablet (10 mg total) by mouth 3 (three) times daily as needed for muscle spasms. 30 tablet 0  . diclofenac sodium (VOLTAREN) 1 % GEL Apply 1 g topically daily as needed. (PAIN)    . docusate sodium (COLACE) 100 MG capsule Take 100 mg by mouth 2 (two) times daily.    . fexofenadine (ALLEGRA) 180 MG tablet Take 180 mg by mouth daily as needed. Allergies     . fluticasone (FLONASE) 50 MCG/ACT nasal spray Place 2 sprays into the nose daily as needed. Allergies     . furosemide (LASIX) 40 MG tablet Take 1 tablet (40 mg total) by mouth daily. 180 tablet 1  . letrozole (FEMARA) 2.5 MG tablet Take 2.5 mg by mouth every evening.      . loratadine (CLARITIN) 10 MG tablet Take 10 mg by mouth daily as needed. For allergies     . montelukast (SINGULAIR) 10 MG tablet Take 10 mg by mouth at bedtime. For allergies & congestion    . Multiple Vitamin (MULTIVITAMIN) tablet Take 1 tablet by mouth daily.      [name] 20 MG capsule Take 20 mg by mouth 2 (two) times daily.      [name] PARoxetine (PAXIL) 10 MG tablet Take 10 mg by mouth every morning.     [name] Glycol-Propyl Glycol (SYSTANE OP) Apply 1 drop to eye daily as needed (dry eyes).     [name] senna-docusate (SENOKOT-S) 8.6-50 MG per tablet Take 1 tablet by mouth daily.      [name] spironolactone  (ALDACTONE) 25 MG tablet Take 1 tablet (25 mg total) by mouth daily. 90 tablet 3  . traMADol (ULTRAM) 50 MG tablet Take 50 mg by mouth every 6 (six) hours as needed for pain.    . valsartan (DIOVAN) 40 MG tablet TAKE ONE TABLET BY MOUTH AT BEDTIME 60 tablet 3  . [DISCONTINUED] ranitidine (ZANTAC) 300 MG capsule Take  300 mg by mouth daily as needed. Acid indigestion     No current facility-administered medications for this visit.    Allergies:   Alphagan; Polyvinyl alcohol; Tape; and Travatan z   Social History: Social History   Social History  . Marital Status: Married    Spouse Name: N/A  . Number of Children: N/A  . Years of Education: N/A   Occupational History  . [employer]  . Retired from Science writer    Social History Main Topics  . Smoking status: Never Smoker   . Smokeless tobacco: Never Used  . Alcohol Use: No  . Drug Use: No  . Sexual Activity: Not on file   Other Topics Concern  . Not on file   Social History Narrative   Married   One child   Lives in [place] with spouse and mother in law   Previously worked as a Art gallery manager for [employer]      She is a Leisure centre manager counsel member.    Family History: Family History  Problem Relation Age of Onset  . Hypertension Mother   . Hypertension Father   . Hypertension Brother   . Hypertension Maternal Grandfather   . Heart attack Maternal Grandfather     MI  . Coronary artery disease Maternal Grandfather   . Hypertension Paternal Grandfather   . Heart attack Paternal Grandfather     MI  . Coronary artery disease Paternal Grandfather   . Hypertension Brother     Review of Systems: All other systems reviewed and are otherwise negative except as noted above.   Physical Exam: VS:  BP 102/64 mmHg  Pulse 73  Ht 5\' 2"  (1.575 m)  Wt 248 lb (112.492 kg)  BMI 45.35 kg/m2 , BMI Body mass index is 45.35 kg/(m^2).  GEN- The patient is obese appearing, alert and oriented x 3 today.   HEENT:  normocephalic, atraumatic; sclera clear, conjunctiva pink; hearing intact; oropharynx clear; neck supple  Lungs- Clear to ausculation bilaterally, normal work of breathing.  No wheezes, rales, rhonchi Heart- Regular rate and rhythm (paced) GI- soft, non-tender, non-distended, bowel sounds present  Extremities- no clubbing, cyanosis, no edema; DP/PT/radial pulses 2+ bilaterally MS- no significant deformity or atrophy Skin- warm and dry, no rash or lesion; ICD pocket well healed Psych- euthymic mood, full affect Neuro- strength and sensation are intact  ICD site is stable, no tethering or discomfort  EKG:  EKG today: AV paced, PVCs ICD interrogation today: Battery reached ERI 12/18/15, RV lead threshold 1.5 @ 0.8 (up from 1.0@0 .35ms), outputs re-porgrammed to 3.0V@0 .21ms, no V episodes, AMS episodes felt to be far field sensing not tru AF  CPX (8/16): Peak VO2 10.1 VE/VCO2 slope 28.4 RER 1.14 OUES 1.3 PFTs mildly restrictive No significant change compared to prior. Limited more by obesity and restrictive lung physiology than heart (mild circulatory limitation).   ECHO 01/02/13 EF 0000000 Grade 1 diastolic dysfunction. RV ok. No PAH ECHO 07/10/13 EF 20% RV ok ECHO 10/13/14 EF 15-20% RV ok.   10/13/15: Echocardiogram Study Conclusions - Left ventricle: The cavity size was severely dilated. Wall thickness was normal. The estimated ejection fraction was in the range of 10% to 15%. Diffuse hypokinesis. Doppler parameters are consistent with abnormal left ventricular relaxation (grade 1 diastolic dysfunction). Doppler parameters are consistent with high ventricular filling pressure. - Left atrium: The atrium was moderately dilated.  Recent Labs  Wt Readings from Last 3 Encounters:  12/22/15 248 lb (112.492 kg)  10/13/15 246 lb (111.585 kg)  07/17/15 248 lb 8 oz (112.719 kg)     Other studies Reviewed: Additional studies/ records that were reviewed today include: AHF notes, Dr  Jackalyn Lombard last office notes  Assessment and Plan:  1.  Chronic systolic dysfunction/NICM euvolemic today Stable on BB/ARB, aldactone Normal ICD function it has reached ERI. See Claudia Desanctis Art report   2.  Paroxysmal atrial fibrillation, one known event in 2012 post-op Device interrogation today  demonstrates mode switch episodes that are felt to be far field sensing when reviewed with industry rep, no true AF episodes, previously noted Atach episodes only Again, if she had documented sustained episodes of AF, will need to consider initiation of anticoagulation CHADS2VASC is at least 3  3. Obesity Weight loss encouraged today  4.  HTN Stable No change required today  5.  Type B aortic dissection BP appears very well controlled   Current medicines are reviewed at length with the patient today.   The patient does not have concerns regarding her medicines.  The following changes were made today:  none   Disposition:   She will be scheduled for a generator change with Dr. [name], pre-procedure labs arranged, she will f/u with Dr. Julianne Handler and HF, we will continue q 20month remotes and f/u EP 6 months.   ADDENDUM: Risks, benefits of the procedure were discussed with the patient, she is agreeable to proceed.    Venetia Night, PA-C 12/22/2015 1:26 PM  [place] [address] [phone] (office) [phone] (fax

## 2015-12-22 NOTE — Patient Instructions (Addendum)
Medication Instructions:   Your physician recommends that you continue on your current medications as directed. Please refer to the Current Medication list given to you today.   If you need a refill on your cardiac medications before your next appointment, please call your pharmacy.  Labwork:  RETURN FOR LABS ON 01/04/16 FOR CBC BMET AND PT/INR    Testing/Procedures:  SEE LETTER FOR GEN CHANGE  01/12/16   Follow-Up:  10 DAYS AFTER  01/12/16  FOR : WOUND CHECK WITH DEVICE CLINIC    91 DAYS AFTER   01/12/16  FOR : PHYS DEFIB WITH DR [name]   Any Other Special Instructions Will Be Listed Below (If Applicable).

## 2015-12-23 ENCOUNTER — Other Ambulatory Visit: Payer: Self-pay | Admitting: Physician Assistant

## 2015-12-29 ENCOUNTER — Encounter: Payer: Self-pay | Admitting: Internal Medicine

## 2016-01-04 ENCOUNTER — Other Ambulatory Visit (INDEPENDENT_AMBULATORY_CARE_PROVIDER_SITE_OTHER): Payer: PRIVATE HEALTH INSURANCE | Admitting: *Deleted

## 2016-01-04 DIAGNOSIS — I429 Cardiomyopathy, unspecified: Secondary | ICD-10-CM | POA: Diagnosis not present

## 2016-01-04 LAB — CBC
HEMATOCRIT: 40.8 % (ref 36.0–46.0)
HEMOGLOBIN: 13.9 g/dL (ref 12.0–15.0)
MCH: 30.7 pg (ref 26.0–34.0)
MCHC: 34.1 g/dL (ref 30.0–36.0)
MCV: 90.1 fL (ref 78.0–100.0)
MPV: 12.5 fL — ABNORMAL HIGH (ref 8.6–12.4)
PLATELETS: 154 10*3/uL (ref 150–400)
RBC: 4.53 MIL/uL (ref 3.87–5.11)
RDW: 15.1 % (ref 11.5–15.5)
WBC: 7.4 10*3/uL (ref 4.0–10.5)

## 2016-01-04 LAB — BASIC METABOLIC PANEL
BUN: 18 mg/dL (ref 7–25)
CHLORIDE: 103 mmol/L (ref 98–110)
CO2: 28 mmol/L (ref 20–31)
CREATININE: 0.94 mg/dL (ref 0.50–0.99)
Calcium: 9.1 mg/dL (ref 8.6–10.4)
Glucose, Bld: 116 mg/dL — ABNORMAL HIGH (ref 65–99)
Potassium: 3.7 mmol/L (ref 3.5–5.3)
Sodium: 141 mmol/L (ref 135–146)

## 2016-01-04 LAB — PROTIME-INR
INR: 1.02 (ref <1.50)
PROTHROMBIN TIME: 13.5 s (ref 11.6–15.2)

## 2016-01-08 ENCOUNTER — Telehealth: Payer: Self-pay | Admitting: *Deleted

## 2016-01-08 NOTE — Telephone Encounter (Signed)
spoke to patient about results.[name]

## 2016-01-11 ENCOUNTER — Telehealth: Payer: Self-pay | Admitting: Internal Medicine

## 2016-01-11 NOTE — Telephone Encounter (Signed)
New message      Pt is due to have a pacemaker put in tomorrow.  She has questions.  Please call

## 2016-01-11 NOTE — Telephone Encounter (Signed)
Returned call to patient and answered her questions.  She will talk to Dr [name] tomorrow in regards to her families concern about spending the night after generator change.

## 2016-01-12 ENCOUNTER — Ambulatory Visit (HOSPITAL_COMMUNITY)
Admission: RE | Admit: 2016-01-12 | Discharge: 2016-01-12 | Disposition: A | Discharge status: Home / Self Care | Payer: PRIVATE HEALTH INSURANCE | Source: Ambulatory Visit | Attending: Internal Medicine | Admitting: Internal Medicine

## 2016-01-12 ENCOUNTER — Encounter (HOSPITAL_COMMUNITY)
Admission: RE | Disposition: A | Discharge status: Home / Self Care | Payer: Self-pay | Source: Ambulatory Visit | Attending: Internal Medicine

## 2016-01-12 ENCOUNTER — Other Ambulatory Visit: Payer: Self-pay

## 2016-01-12 DIAGNOSIS — I10 Essential (primary) hypertension: Secondary | ICD-10-CM | POA: Diagnosis not present

## 2016-01-12 DIAGNOSIS — K449 Diaphragmatic hernia without obstruction or gangrene: Secondary | ICD-10-CM | POA: Diagnosis not present

## 2016-01-12 DIAGNOSIS — Z6841 Body Mass Index (BMI) 40.0 and over, adult: Secondary | ICD-10-CM | POA: Insufficient documentation

## 2016-01-12 DIAGNOSIS — Z923 Personal history of irradiation: Secondary | ICD-10-CM | POA: Insufficient documentation

## 2016-01-12 DIAGNOSIS — K219 Gastro-esophageal reflux disease without esophagitis: Secondary | ICD-10-CM | POA: Diagnosis not present

## 2016-01-12 DIAGNOSIS — M1990 Unspecified osteoarthritis, unspecified site: Secondary | ICD-10-CM | POA: Diagnosis not present

## 2016-01-12 DIAGNOSIS — E669 Obesity, unspecified: Secondary | ICD-10-CM | POA: Insufficient documentation

## 2016-01-12 DIAGNOSIS — I5022 Chronic systolic (congestive) heart failure: Secondary | ICD-10-CM | POA: Diagnosis present

## 2016-01-12 DIAGNOSIS — I480 Paroxysmal atrial fibrillation: Secondary | ICD-10-CM | POA: Insufficient documentation

## 2016-01-12 DIAGNOSIS — I428 Other cardiomyopathies: Secondary | ICD-10-CM

## 2016-01-12 DIAGNOSIS — I447 Left bundle-branch block, unspecified: Secondary | ICD-10-CM | POA: Diagnosis not present

## 2016-01-12 DIAGNOSIS — I429 Cardiomyopathy, unspecified: Secondary | ICD-10-CM | POA: Diagnosis not present

## 2016-01-12 DIAGNOSIS — I7100 Dissection of unspecified site of aorta: Secondary | ICD-10-CM | POA: Diagnosis not present

## 2016-01-12 DIAGNOSIS — Z853 Personal history of malignant neoplasm of breast: Secondary | ICD-10-CM | POA: Diagnosis not present

## 2016-01-12 DIAGNOSIS — E785 Hyperlipidemia, unspecified: Secondary | ICD-10-CM | POA: Insufficient documentation

## 2016-01-12 DIAGNOSIS — Z9221 Personal history of antineoplastic chemotherapy: Secondary | ICD-10-CM | POA: Insufficient documentation

## 2016-01-12 DIAGNOSIS — Z4502 Encounter for adjustment and management of automatic implantable cardiac defibrillator: Secondary | ICD-10-CM | POA: Insufficient documentation

## 2016-01-12 DIAGNOSIS — Z86718 Personal history of other venous thrombosis and embolism: Secondary | ICD-10-CM | POA: Diagnosis not present

## 2016-01-12 DIAGNOSIS — Z7982 Long term (current) use of aspirin: Secondary | ICD-10-CM | POA: Diagnosis not present

## 2016-01-12 DIAGNOSIS — I509 Heart failure, unspecified: Secondary | ICD-10-CM | POA: Diagnosis not present

## 2016-01-12 DIAGNOSIS — F329 Major depressive disorder, single episode, unspecified: Secondary | ICD-10-CM | POA: Diagnosis not present

## 2016-01-12 HISTORY — PX: EP IMPLANTABLE DEVICE: SHX172B

## 2016-01-12 LAB — SURGICAL PCR SCREEN
MRSA, PCR: NEGATIVE
Staphylococcus aureus: NEGATIVE

## 2016-01-12 SURGERY — ICD/BIV ICD GENERATOR CHANGEOUT
Anesthesia: LOCAL

## 2016-01-12 MED ORDER — CHLORHEXIDINE GLUCONATE 4 % EX LIQD: 60.0000 mL | Freq: Once | CUTANEOUS | Status: DC

## 2016-01-12 MED ORDER — SODIUM CHLORIDE 0.9% FLUSH: 3.0000 mL | Freq: Two times a day (BID) | INTRAVENOUS | Status: DC

## 2016-01-12 MED ORDER — ACETAMINOPHEN 325 MG PO TABS
325.0000 mg | ORAL_TABLET | ORAL | Status: DC | PRN
  Filled 2016-01-12: qty 2

## 2016-01-12 MED ORDER — CEFAZOLIN SODIUM-DEXTROSE 2-3 GM-% IV SOLR
2.0000 g | INTRAVENOUS | Status: AC
  Administered 2016-01-12: 2 g via INTRAVENOUS

## 2016-01-12 MED ORDER — SODIUM CHLORIDE 0.9% FLUSH: 3.0000 mL | INTRAVENOUS | Status: DC | PRN

## 2016-01-12 MED ORDER — CEFAZOLIN SODIUM-DEXTROSE 2-3 GM-% IV SOLR
INTRAVENOUS | Status: AC
  Filled 2016-01-12: qty 50

## 2016-01-12 MED ORDER — SODIUM CHLORIDE 0.9 % IV SOLN: 250.0000 mL | INTRAVENOUS | Status: DC | PRN

## 2016-01-12 MED ORDER — FENTANYL CITRATE (PF) 100 MCG/2ML IJ SOLN
INTRAMUSCULAR | Status: AC
  Filled 2016-01-12: qty 2

## 2016-01-12 MED ORDER — SODIUM CHLORIDE 0.9 % IR SOLN
80.0000 mg | Status: AC
  Administered 2016-01-12: 80 mg

## 2016-01-12 MED ORDER — SODIUM CHLORIDE 0.9 % IV SOLN
INTRAVENOUS | Status: DC
  Administered 2016-01-12: 13:00:00 via INTRAVENOUS

## 2016-01-12 MED ORDER — LIDOCAINE HCL (PF) 1 % IJ SOLN
INTRAMUSCULAR | Status: DC | PRN
  Administered 2016-01-12: 31 mL

## 2016-01-12 MED ORDER — FENTANYL CITRATE (PF) 100 MCG/2ML IJ SOLN
INTRAMUSCULAR | Status: DC | PRN
  Administered 2016-01-12: 12.5 ug via INTRAVENOUS

## 2016-01-12 MED ORDER — SODIUM CHLORIDE 0.9 % IR SOLN
Status: AC
  Filled 2016-01-12: qty 2

## 2016-01-12 MED ORDER — MUPIROCIN 2 % EX OINT
TOPICAL_OINTMENT | CUTANEOUS | Status: AC
  Administered 2016-01-12: 1 via TOPICAL
  Filled 2016-01-12: qty 22

## 2016-01-12 MED ORDER — MUPIROCIN 2 % EX OINT
1.0000 "application " | TOPICAL_OINTMENT | Freq: Once | CUTANEOUS | Status: AC
  Administered 2016-01-12: 1 via TOPICAL

## 2016-01-12 MED ORDER — LIDOCAINE HCL (PF) 1 % IJ SOLN
INTRAMUSCULAR | Status: AC
  Filled 2016-01-12: qty 60

## 2016-01-12 MED ORDER — ONDANSETRON HCL 4 MG/2ML IJ SOLN: 4.0000 mg | Freq: Four times a day (QID) | INTRAMUSCULAR | Status: DC | PRN

## 2016-01-12 SURGICAL SUPPLY — 4 items
CABLE SURGICAL S-101-97-12 (CABLE) ×2 IMPLANT
ICD UNIFY ASUR CRT CD3357-40Q (ICD Generator) ×2 IMPLANT
PAD DEFIB LIFELINK (PAD) ×2 IMPLANT
TRAY PACEMAKER INSERTION (PACKS) ×2 IMPLANT

## 2016-01-12 NOTE — Interval H&P Note (Signed)
History and Physical Interval Note:   ICD Criteria  Current LVEF:10%. Within 12 months prior to implant: Yes   Heart failure history: Yes, Class III  Cardiomyopathy history: Yes, Non-Ischemic Cardiomyopathy.  Atrial Fibrillation/Atrial Flutter: Yes, Paroxysmal.  Ventricular tachycardia history: No.  Cardiac arrest history: No.  History of syndromes with risk of sudden death: No.  Previous ICD: Yes, Reason for ICD:  Primary prevention.  Current ICD indication: Primary  PPM indication: BiV ICD  Beta Blocker therapy for 3 or more months: Yes, prescribed.   Ace Inhibitor/ARB therapy for 3 or more months: Yes, prescribed.      01/12/2016 2:51 PM  [name]  has presented today for surgery, with the diagnosis of cardiomyopathy, ERI  The various methods of treatment have been discussed with the patient and family. After consideration of risks, benefits and other options for treatment, the patient has consented to  Procedure(s): BIV ICD Generator Changeout (N/A) as a surgical intervention .  The patient's history has been reviewed, patient examined, no change in status, stable for surgery.  I have reviewed the patient's chart and labs.  Questions were answered to the patient's satisfaction.    Risks, benefits, and alternatives to BiV ICD pulse generator replacement were discussed in detail today.  The patient understands that risks include but are not limited to bleeding, infection, pneumothorax, perforation, tamponade, vascular damage, renal failure, MI, stroke, death, inappropriate shocks, damage to his existing leads, and lead dislodgement and wishes to proceed.   Thompson Grayer

## 2016-01-12 NOTE — Discharge Instructions (Signed)
Pacemaker Battery Change, Care After °Refer to this sheet in the next few weeks. These instructions provide you with information on caring for yourself after your procedure. Your health care provider may also give you more specific instructions. Your treatment has been planned according to current medical practices, but problems sometimes occur. Call your health care provider if you have any problems or questions after your procedure. °WHAT TO EXPECT AFTER THE PROCEDURE °After your procedure, it is typical to have the following sensations: °· Soreness at the pacemaker site. °HOME CARE INSTRUCTIONS  °· Keep the incision clean and dry. °· Do not get site wet for one week °· For the first week after the replacement, avoid stretching motions that pull at the incision site, and avoid heavy exercise with the arm that is on the same side as the incision. °· Take medicines only as directed by your health care provider. °· Keep all follow-up visits as directed by your health care provider. °SEEK MEDICAL CARE IF:  °· You have pain at the incision site that is not relieved by over-the-counter or prescription medicine. °· There is drainage or pus from the incision site. °· There is swelling larger than a lime at the incision site. °· You develop red streaking that extends above or below the incision site. °· You feel brief, intermittent palpitations, light-headedness, or any symptoms that you feel might be related to your heart. °SEEK IMMEDIATE MEDICAL CARE IF:  °· You experience chest pain that is different than the pain at the pacemaker site. °· You experience shortness of breath. °· You have palpitations or irregular heartbeat. °· You have light-headedness that does not go away quickly. °· You faint. °· You have pain that gets worse and is not relieved by medicine. °  °This information is not intended to replace advice given to you by your health care provider. Make sure you discuss any questions you have with your health  care provider. °  °Document Released: 08/07/2013 Document Revised: 11/07/2014 Document Reviewed: 08/07/2013 °Elsevier Interactive Patient Education ©2016 [employer] ° °

## 2016-01-12 NOTE — H&P (View-Only) (Signed)
Electrophysiology Office Note Date: 12/22/2015  ID:  [name], [name] 12-17-1950, MRN [id number]  PCP: Brittney Rile, MD Primary Cardiologist: [name] Electrophysiologist: [facility]  CC: device is at [facility] is a 65 y.o. female seen today for Dr [name].  She has PMHx of NICM/ICEF 10-15% in May 2013 which has decreased from 65% in 2010. Cath Dec 2011 showed normal arteries, Type B aortic dissection treated medically, LBBB, HTN, HLD, CHF, an event of post-op AF in 2012, breast cancer with lumpectomy chemo and Rad tx.  She presents today for routine electrophysiology follow up.  Since last being seen in our clinic, with [name] Sept 2016, the patient reports doing very well.   She denies chest pain, palpitations, PND, orthopnea, nausea, vomiting, dizziness, syncope, edema, weight gain, or early satiety.  She has not had ICD shocks. She takes prn extra Lasix based on weight and symptoms but has been stable for some time.  She is active 3-4 hours per day, though orthopedic issues are becoming a problem and limiting.   She reports labs are done routinely with her PMD Last noted 06/16/15, CBC/BMET look good, BUN/Creat 19/1.00, K+ 4.0    Device History: STJ CRTD implanted 2012 for NICM, LBBB, CHF (epicardial LV lead placed by [facility] 2012) History of appropriate therapy: No She was shocked once post epicardial lead for RAFib, none since. There are 2 lifetime charges for the device, implant record shows DFT testing was done. History of AAD therapy: No  Past Medical History  Diagnosis Date  . Nonischemic cardiomyopathy ([name])     Admission 12/11 with EF 25%, normal coronary arteries by cath 12/11; NICM presumed to be secondary to chemotherapy for breast cancer  . DVT (deep venous thrombosis) ([place]) 10/18/2010  . Obesity   . Aortic dissection ([name]) 12/2008    Type B  . Glaucoma   . Depression   . Breast cancer ([name]) 07/2009    Stage 3 lumpectomy, radiation,  chemotherapy; finished chemo October, felt to be in remission  . History of shingles   . Hyperlipidemia   . Chronic systolic dysfunction of left ventricle   . Left bundle branch block   . Cardiac defibrillator in place     St. Jude (JA) 9/12; LV lead placement unsuccessful;  s/p left thoracotomy with placement of epicardial LV lead 09/2011 (Dr. PVT)  . [name]     with exertion  . Hypertension   . CHF (congestive heart failure) ([name])   . GERD (gastroesophageal reflux disease)   . Arthritis     lower back  . Hiatal hernia     small hiatal hernia  . Atrial fibrillation ([place])     post op (left thoracotomy) 11/12 req amio/DCCV   Past Surgical History  Procedure Laterality Date  . Tubal ligation      Bilateral  . Oophorectomy      Single  . Breast lumpectomy      Right breast  . Tonsillectomy    . Cardiac defibrillator placement    . Cardiac catheterization      2011  . Thoracotomy  09/16/2011    Procedure: THORACOTOMY MAJOR;  Surgeon: Brittney Aquas Adelene Idler, MD;  Location: [facility] OR;  Service: Thoracic;  Laterality: Left;  left anterolateral Thoracotomy for placement of St. Jude epicardial pacing lead     Current Outpatient Prescriptions  Medication Sig Dispense Refill  . albuterol (PROVENTIL HFA;VENTOLIN HFA) 108 (90 BASE) MCG/ACT inhaler Inhale 2  puffs into the lungs every 4 (four) hours as needed. [name]     . albuterol (PROVENTIL) (2.5 MG/3ML) 0.083% nebulizer solution Take 2.5 mg by nebulization every 4 (four) hours as needed. [name]     . allopurinol (ZYLOPRIM) 100 MG tablet Take 100 mg by mouth daily.     [name] ALPRAZolam (XANAX) 1 MG tablet Take 0.5 mg by mouth daily as needed for anxiety. Anxiety     . aspirin 81 MG EC tablet Take 81 mg by mouth daily.      [name] azelastine (OPTIVAR) 0.05 % ophthalmic solution Place 1 drop into both eyes daily.    . Bepotastine Besilate (BEPREVE) 1.5 % SOLN Place 1 drop into both eyes daily as needed. Dry  eyes     . bimatoprost (LUMIGAN) 0.03 % ophthalmic solution Place 1 drop into both eyes at bedtime.      . Calcium Carbonate-Vitamin D (CALCIUM-VITAMIN D3) 600-200 MG-UNIT TABS Take 1 tablet by mouth 2 (two) times daily.      . carvedilol (COREG) 12.5 MG tablet Take 1 tablet (12.5 mg total) by mouth 2 (two) times daily with a meal. 225 tablet 3  . cholecalciferol (VITAMIN D) 1000 UNITS tablet Take 1,000 Units by mouth 2 (two) times daily.      . colchicine 0.6 MG tablet Take 0.6 mg by mouth daily as needed. (GOUT)  0  . [name] 7.5 MG TABS tablet TAKE ONE TABLET TWICE DAILY WITH A MEAL 60 tablet 3  . cyclobenzaprine (FLEXERIL) 10 MG tablet Take 1 tablet (10 mg total) by mouth 3 (three) times daily as needed for muscle spasms. 30 tablet 0  . diclofenac sodium (VOLTAREN) 1 % GEL Apply 1 g topically daily as needed. (PAIN)    . docusate sodium (COLACE) 100 MG capsule Take 100 mg by mouth 2 (two) times daily.    . fexofenadine (ALLEGRA) 180 MG tablet Take 180 mg by mouth daily as needed. Allergies     . fluticasone (FLONASE) 50 MCG/ACT nasal spray Place 2 sprays into the nose daily as needed. Allergies     . furosemide (LASIX) 40 MG tablet Take 1 tablet (40 mg total) by mouth daily. 180 tablet 1  . letrozole (FEMARA) 2.5 MG tablet Take 2.5 mg by mouth every evening.      . loratadine (CLARITIN) 10 MG tablet Take 10 mg by mouth daily as needed. For allergies     . montelukast (SINGULAIR) 10 MG tablet Take 10 mg by mouth at bedtime. For allergies & congestion    . Multiple Vitamin (MULTIVITAMIN) tablet Take 1 tablet by mouth daily.      [name] 20 MG capsule Take 20 mg by mouth 2 (two) times daily.      [name] PARoxetine (PAXIL) 10 MG tablet Take 10 mg by mouth every morning.     [name] Glycol-Propyl Glycol (SYSTANE OP) Apply 1 drop to eye daily as needed (dry eyes).     [name] senna-docusate (SENOKOT-S) 8.6-50 MG per tablet Take 1 tablet by mouth daily.      [name] spironolactone  (ALDACTONE) 25 MG tablet Take 1 tablet (25 mg total) by mouth daily. 90 tablet 3  . traMADol (ULTRAM) 50 MG tablet Take 50 mg by mouth every 6 (six) hours as needed for pain.    . valsartan (DIOVAN) 40 MG tablet TAKE ONE TABLET BY MOUTH AT BEDTIME 60 tablet 3  . [DISCONTINUED] ranitidine (ZANTAC) 300 MG capsule Take  300 mg by mouth daily as needed. Acid indigestion     No current facility-administered medications for this visit.    Allergies:   Alphagan; Polyvinyl alcohol; Tape; and Travatan z   Social History: Social History   Social History  . Marital Status: Married    Spouse Name: N/A  . Number of Children: N/A  . Years of Education: N/A   Occupational History  . [employer]  . Retired from Science writer    Social History Main Topics  . Smoking status: Never Smoker   . Smokeless tobacco: Never Used  . Alcohol Use: No  . Drug Use: No  . Sexual Activity: Not on file   Other Topics Concern  . Not on file   Social History Narrative   Married   One child   Lives in [place] with spouse and mother in law   Previously worked as a Art gallery manager for [employer]      She is a Leisure centre manager counsel member.    Family History: Family History  Problem Relation Age of Onset  . Hypertension Mother   . Hypertension Father   . Hypertension Brother   . Hypertension Maternal Grandfather   . Heart attack Maternal Grandfather     MI  . Coronary artery disease Maternal Grandfather   . Hypertension Paternal Grandfather   . Heart attack Paternal Grandfather     MI  . Coronary artery disease Paternal Grandfather   . Hypertension Brother     Review of Systems: All other systems reviewed and are otherwise negative except as noted above.   Physical Exam: VS:  BP 102/64 mmHg  Pulse 73  Ht 5\' 2"  (1.575 m)  Wt 248 lb (112.492 kg)  BMI 45.35 kg/m2 , BMI Body mass index is 45.35 kg/(m^2).  GEN- The patient is obese appearing, alert and oriented x 3 today.   HEENT:  normocephalic, atraumatic; sclera clear, conjunctiva pink; hearing intact; oropharynx clear; neck supple  Lungs- Clear to ausculation bilaterally, normal work of breathing.  No wheezes, rales, rhonchi Heart- Regular rate and rhythm (paced) GI- soft, non-tender, non-distended, bowel sounds present  Extremities- no clubbing, cyanosis, no edema; DP/PT/radial pulses 2+ bilaterally MS- no significant deformity or atrophy Skin- warm and dry, no rash or lesion; ICD pocket well healed Psych- euthymic mood, full affect Neuro- strength and sensation are intact  ICD site is stable, no tethering or discomfort  EKG:  EKG today: AV paced, PVCs ICD interrogation today: Battery reached ERI 12/18/15, RV lead threshold 1.5 @ 0.8 (up from 1.0@0 .89ms), outputs re-porgrammed to 3.0V@0 .28ms, no V episodes, AMS episodes felt to be far field sensing not tru AF  CPX (8/16): Peak VO2 10.1 VE/VCO2 slope 28.4 RER 1.14 OUES 1.3 PFTs mildly restrictive No significant change compared to prior. Limited more by obesity and restrictive lung physiology than heart (mild circulatory limitation).   ECHO 01/02/13 EF 0000000 Grade 1 diastolic dysfunction. RV ok. No PAH ECHO 07/10/13 EF 20% RV ok ECHO 10/13/14 EF 15-20% RV ok.   10/13/15: Echocardiogram Study Conclusions - Left ventricle: The cavity size was severely dilated. Wall thickness was normal. The estimated ejection fraction was in the range of 10% to 15%. Diffuse hypokinesis. Doppler parameters are consistent with abnormal left ventricular relaxation (grade 1 diastolic dysfunction). Doppler parameters are consistent with high ventricular filling pressure. - Left atrium: The atrium was moderately dilated.  Recent Labs  Wt Readings from Last 3 Encounters:  12/22/15 248 lb (112.492 kg)  10/13/15 246 lb (111.585 kg)  07/17/15 248 lb 8 oz (112.719 kg)     Other studies Reviewed: Additional studies/ records that were reviewed today include: AHF notes, Dr  Jackalyn Lombard last office notes  Assessment and Plan:  1.  Chronic systolic dysfunction/NICM euvolemic today Stable on BB/ARB, aldactone Normal ICD function it has reached ERI. See Claudia Desanctis Art report   2.  Paroxysmal atrial fibrillation, one known event in 2012 post-op Device interrogation today  demonstrates mode switch episodes that are felt to be far field sensing when reviewed with industry rep, no true AF episodes, previously noted Atach episodes only Again, if she had documented sustained episodes of AF, will need to consider initiation of anticoagulation CHADS2VASC is at least 3  3. Obesity Weight loss encouraged today  4.  HTN Stable No change required today  5.  Type B aortic dissection BP appears very well controlled   Current medicines are reviewed at length with the patient today.   The patient does not have concerns regarding her medicines.  The following changes were made today:  none   Disposition:   She will be scheduled for a generator change with Dr. Rayann Davis, pre-procedure labs arranged, she will f/u with Dr. Julianne Handler and HF, we will continue q 66month remotes and f/u EP 6 months.   ADDENDUM: Risks, benefits of the procedure were discussed with the patient, she is agreeable to proceed.    Venetia Night, PA-C 12/22/2015 1:26 PM  [address] [place] [address] [phone] (office) [phone] (fax

## 2016-01-13 ENCOUNTER — Encounter (HOSPITAL_COMMUNITY): Payer: Self-pay | Admitting: Internal Medicine

## 2016-01-13 MED FILL — Gentamicin Sulfate Inj 40 MG/ML: INTRAMUSCULAR | Qty: 2 | Status: AC

## 2016-01-13 MED FILL — Sodium Chloride Irrigation Soln 0.9%: Qty: 500 | Status: AC

## 2016-01-15 ENCOUNTER — Telehealth: Payer: Self-pay | Admitting: Physician Assistant

## 2016-01-15 ENCOUNTER — Telehealth: Payer: Self-pay | Admitting: Internal Medicine

## 2016-01-15 NOTE — Telephone Encounter (Signed)
Patient has history of nonischemic cardiomyopathy and the low EF recently had ICD placed contacted after hour service for advice regarding bradycardia. She states her heart rate would sometimes get down to the 40s. She have called his office today and was instructed to manually send in transmission for device clinic to review. However she was unable to do so. She called the device company in [place] and was told her remote transmission was not probably set up.  I have instructed her to contact device clinic on Monday as long as she is asymptomatic. If she does have feeling of passing out, she will need to seek medical attention immediately. She is asymptomatic at this time. Likely cause of her pulse reading going down to the 40s could be related to PVCs or PACs which is not going to generate normal pulse on physical exam, however device should be set up for remote interrogation in case device malfunction or lead fracture.  Hilbert Corrigan PA Pager: [phone]

## 2016-01-15 NOTE — Telephone Encounter (Signed)
Spoke with patient who states she has been getting pulse readings ranging from 47 bpm to 70's.  She is using a wrist BP monitor to monitor heart rate.  I advised her to manually check her pulse for 1 full minute while on the phone with me.  She states she counts 50 bpm. I spoke with Shakila in device clinic and she advised that patient send a manual transmission for their review.  I advised patient to send transmission and that someone from device clinic will call him.  She verbalized understanding and agreement.

## 2016-01-15 NOTE — Telephone Encounter (Signed)
New Message:  Pt just called in stating that she just had a pace maker place and since then she has been monitoring her BP levels and pulse rates. She noticed that in the mornings her pulse rate gets down to the 40's which concerns her and she would like to speak with someone about this . Please f/u with her

## 2016-01-15 NOTE — Telephone Encounter (Signed)
Left message for patient to call back  

## 2016-01-15 NOTE — Telephone Encounter (Signed)
Follow Up:; ° ° °Returning your call. °

## 2016-01-18 ENCOUNTER — Encounter: Payer: PRIVATE HEALTH INSURANCE | Admitting: Internal Medicine

## 2016-01-18 NOTE — Telephone Encounter (Signed)
Reviewed patient's transmission with Dr. [name].  Patient's PVC burden has increased since last check (6.9K to 89K), likely due to her lower rate being reprogrammed from 60bpm to 50bpm at her generator change per Dr. Jackalyn Lombard instruction.  Advised patient that her automatic B/P monitor will not accurately count PVCs, but that her device is programmed to keep her HR at or above 50bpm.  Patient is asymptomatic with her lower HR and with PVCs, but she is concerned because her instructions for [name] state that she should only take it if her HR is 70bpm or greater.  Reviewed with Dr. [name], who advised that patient should continue to take her [name].  Patient verbalizes understanding and states that she will "just ignore" the HR listed on her B/P monitor.  She is aware to call if she develops any new or worsening symptoms.  Patient is appreciative of call and denies questions at this time.

## 2016-01-18 NOTE — Telephone Encounter (Signed)
Pt walked into office as she as told that transmission she sent last week did not go through. Pt was very worried that her HR is low. Pt has no complaints of dizziness, lightheadedness, or weakness.  Pt information reviewed with device, system was updated with her generator change information, and she is to try to send another transmission once she gets home. Pt given device clinic number to call back this afternoon after sending transmission is she feels she is having problems.  Pt verbalized understanding, no additional questions at this time.

## 2016-01-18 NOTE — Telephone Encounter (Signed)
Able to reach patient on her cell phone.  She states that she will send a remote transmission for review in about an hour when she gets home.  She requests that I call her on her cell phone after I review her transmission.  Advised patient that I will be on the lookout for it and she is appreciative.  She denies any additional questions at this time.

## 2016-01-25 ENCOUNTER — Ambulatory Visit (INDEPENDENT_AMBULATORY_CARE_PROVIDER_SITE_OTHER): Payer: PRIVATE HEALTH INSURANCE | Admitting: *Deleted

## 2016-01-25 ENCOUNTER — Encounter: Payer: Self-pay | Admitting: Internal Medicine

## 2016-01-25 DIAGNOSIS — I447 Left bundle-branch block, unspecified: Secondary | ICD-10-CM

## 2016-01-25 DIAGNOSIS — I429 Cardiomyopathy, unspecified: Secondary | ICD-10-CM

## 2016-01-25 DIAGNOSIS — I519 Heart disease, unspecified: Secondary | ICD-10-CM | POA: Diagnosis not present

## 2016-01-25 DIAGNOSIS — Z95810 Presence of automatic (implantable) cardiac defibrillator: Secondary | ICD-10-CM

## 2016-01-25 DIAGNOSIS — I428 Other cardiomyopathies: Secondary | ICD-10-CM

## 2016-01-25 NOTE — Progress Notes (Signed)
Wound check appointment. Steri-strips removed. Wound without redness or edema. Incision edges approximated, wound well healed. Normal device function. Thresholds, sensing, and impedances consistent with implant measurements. Noted occasional RA undersensing, previously noted that P-waves measure 0.11mV-1.2mV, reprogrammed atrial max sensitivity from 0.58mV to 0.52mV. Device programmed at chronic outputs. Histogram distribution appropriate for patient and level of activity. No mode switches or ventricular arrhythmias noted. Patient educated about wound care, arm mobility, and shock plan. ROV with JA on 04/15/16.

## 2016-04-15 ENCOUNTER — Ambulatory Visit (INDEPENDENT_AMBULATORY_CARE_PROVIDER_SITE_OTHER): Payer: PRIVATE HEALTH INSURANCE | Admitting: Internal Medicine

## 2016-04-15 ENCOUNTER — Encounter: Payer: Self-pay | Admitting: Internal Medicine

## 2016-04-15 VITALS — BP 74/54 | HR 69 | Ht 62.5 in | Wt 247.0 lb

## 2016-04-15 DIAGNOSIS — I5022 Chronic systolic (congestive) heart failure: Secondary | ICD-10-CM

## 2016-04-15 DIAGNOSIS — I480 Paroxysmal atrial fibrillation: Secondary | ICD-10-CM | POA: Diagnosis not present

## 2016-04-15 NOTE — Progress Notes (Signed)
Referred to ICM clinic by Dr [name] at office visit.  Corvue was turned on today and patient agreed to monthly follow up.  Advised it may take a couple of months for fluid baseline to develop.  1st ICM transmission scheduled for 06/15/2016.  Provided phone number and reviewed fluid symptoms that she should call for.

## 2016-04-15 NOTE — Progress Notes (Signed)
Electrophysiology Office Note   Date:  04/15/2016   ID:  [name], DOB 1951/05/06, MRN [id number]  PCP:  [name], MD  Cardiologist:  Dr Angelena Form, also followed in the advanced CHF clinic Primary Electrophysiologist: Thompson Grayer, MD    Chief Complaint  Patient presents with  . Atrial Fibrillation     History of Present Illness: [name] is a 65 y.o. female who presents today for electrophysiology evaluation.   She has done reasonably well.  Occasional dizziness. Today, she denies symptoms of palpitations, chest pain, shortness of breath, orthopnea, PND, lower extremity edema, claudication,  presyncope, syncope, bleeding, or neurologic sequela. The patient is tolerating medications without difficulties and is otherwise without complaint today.    Past Medical History  Diagnosis Date  . Nonischemic cardiomyopathy ([place])     Admission 12/11 with EF 25%, normal coronary arteries by cath 12/11; NICM presumed to be secondary to chemotherapy for breast cancer  . DVT (deep venous thrombosis) ([place]) 10/18/2010  . Obesity   . 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 Glaucoma   . Depression   . Breast cancer ([place]) 07/2009    Stage 3 lumpectomy, radiation, chemotherapy; finished chemo October, felt to be in remission  . History of shingles   . Hyperlipidemia   . Chronic systolic dysfunction of left ventricle   . Left bundle branch block   . Cardiac defibrillator in place     St. Jude (JA) 9/12; LV lead placement unsuccessful;  s/p left thoracotomy with placement of epicardial LV lead 09/2011 (Dr. PVT)  . [name]     with exertion  . Hypertension   . CHF (congestive heart failure) ([place])   . GERD (gastroesophageal reflux disease)   . Arthritis     lower back  . Hiatal hernia     small hiatal hernia  . Atrial fibrillation ([employer])     post op (left thoracotomy) 11/12 req amio/DCCV   Past Surgical History  Procedure Laterality Date  . Tubal ligation       Bilateral  . Oophorectomy      Single  . Breast lumpectomy      Right breast  . Tonsillectomy    . Cardiac defibrillator placement    . Cardiac catheterization      2011  . Thoracotomy  09/16/2011    Procedure: THORACOTOMY MAJOR;  Surgeon: Tharon Aquas Adelene Idler, MD;  Location: [facility];  Service: Thoracic;  Laterality: Left;  left anterolateral Thoracotomy for placement of St. Jude epicardial pacing lead   . Ep implantable device N/A 01/12/2016    STJ ICD Unify Assura gen change, Dr. [name]     Current Outpatient Prescriptions  Medication Sig Dispense Refill  . albuterol (PROVENTIL HFA;VENTOLIN HFA) 108 (90 BASE) MCG/ACT inhaler Inhale 2 puffs into the lungs every 4 (four) hours as needed. [name]     . albuterol (PROVENTIL) (2.5 MG/3ML) 0.083% nebulizer solution Take 2.5 mg by nebulization every 4 (four) hours as needed. [name]     . allopurinol (ZYLOPRIM) 300 MG tablet Take 300 mg by mouth daily.    [name] ALPRAZolam (XANAX) 1 MG tablet Take 0.5 mg by mouth daily as needed for anxiety. Anxiety     . aspirin 81 MG EC tablet Take 81 mg by mouth daily.      [name] azelastine (OPTIVAR) 0.05 % ophthalmic solution Place 1 drop into both eyes daily.    . Bepotastine  Besilate (BEPREVE) 1.5 % SOLN Place 1 drop into both eyes daily as needed. Dry eyes     . bimatoprost (LUMIGAN) 0.03 % ophthalmic solution Place 1 drop into both eyes at bedtime. Reported on 01/12/2016    . Calcium Carbonate-Vitamin D (CALCIUM-VITAMIN D3) 600-200 MG-UNIT TABS Take 1 tablet by mouth 2 (two) times daily.      . carvedilol (COREG) 12.5 MG tablet Take 12.5 mg by mouth 2 (two) times daily with a meal.    . cholecalciferol (VITAMIN D) 1000 UNITS tablet Take 1,000 Units by mouth 2 (two) times daily.      . colchicine 0.6 MG tablet Take 0.6 mg by mouth daily as needed. (GOUT)  0  . cyclobenzaprine (FLEXERIL) 10 MG tablet Take 1 tablet (10 mg total) by mouth 3 (three) times daily as needed for muscle  spasms. 30 tablet 0  . diclofenac sodium (VOLTAREN) 1 % GEL Apply 1 g topically 3 (three) times daily as needed. (PAIN)    . docusate sodium (COLACE) 100 MG capsule Take 100 mg by mouth 2 (two) times daily.    . fexofenadine (ALLEGRA) 180 MG tablet Take 180 mg by mouth daily as needed. Allergies     . fluticasone (FLONASE) 50 MCG/ACT nasal spray Place 2 sprays into the nose daily as needed. Allergies     . furosemide (LASIX) 40 MG tablet Take 1 tablet (40 mg total) by mouth daily. 180 tablet 1  . HYDROcodone-acetaminophen (NORCO/VICODIN) 5-325 MG tablet as directed.     . ivabradine ([name]) 7.5 MG TABS tablet Take 7.5 mg by mouth 2 (two) times daily with a meal.    . letrozole (FEMARA) 2.5 MG tablet Take 2.5 mg by mouth every evening.      . loratadine (CLARITIN) 10 MG tablet Take 10 mg by mouth daily as needed. For allergies     . montelukast (SINGULAIR) 10 MG tablet Take 10 mg by mouth at bedtime. For allergies & congestion    . Multiple Vitamin (MULTIVITAMIN) tablet Take 1 tablet by mouth daily.      [name] 20 MG capsule Take 20 mg by mouth 2 (two) times daily.      [name] PARoxetine (PAXIL) 20 MG tablet Take 20 mg by mouth daily.  1  . Polyethyl Glycol-Propyl Glycol (SYSTANE OP) Apply 1 drop to eye daily as needed (dry eyes).     [name] senna-docusate (SENOKOT-S) 8.6-50 MG per tablet Take 1 tablet by mouth daily as needed for mild constipation.     [name] spironolactone (ALDACTONE) 25 MG tablet Take 1 tablet (25 mg total) by mouth daily. 90 tablet 3  . traMADol (ULTRAM) 50 MG tablet Take 50 mg by mouth every 6 (six) hours as needed for pain.    . valsartan (DIOVAN) 40 MG tablet TAKE ONE TABLET BY MOUTH AT BEDTIME 60 tablet 3  . [DISCONTINUED] ranitidine (ZANTAC) 300 MG capsule Take 300 mg by mouth daily as needed. Acid indigestion     No current facility-administered medications for this visit.    Allergies:   Alphagan; Mobic; Nsaids; Polyvinyl alcohol; Statins; Tape; Tolmetin; and  Travatan z   Social History:  The patient  reports that she has never smoked. She has never used smokeless tobacco. She reports that she does not drink alcohol or use illicit drugs.   Family History:  The patient's family history includes Coronary artery disease in her maternal grandfather and paternal grandfather; Heart attack in her maternal grandfather and paternal grandfather;  Hypertension in her brother, brother, father, maternal grandfather, mother, and paternal grandfather.    ROS:  Please see the history of present illness.   All other systems are reviewed and negative.    PHYSICAL EXAM: VS:  BP 74/54 mmHg  Pulse 69  Ht 5' 2.5" (1.588 m)  Wt 247 lb (112.038 kg)  BMI 44.43 kg/m2 , BMI Body mass index is 44.43 kg/(m^2). GEN: overweight, in no acute distress HEENT: normal Neck: no JVD, carotid bruits, or masses Cardiac: RRR; no murmurs, rubs, or gallops,no edema  Respiratory:  clear to auscultation bilaterally, normal work of breathing GI: soft, nontender, nondistended, + BS MS: no deformity or atrophy Skin: warm and dry, device pocket is well healed Neuro:  Strength and sensation are intact Psych: euthymic mood, full affect  EKG:  EKG is ordered today. The ekg ordered today shows sinus rhythm with BiV pacing, frequent PVCs  Device interrogation is reviewed today in detail.  See PaceArt for details.   Recent Labs: 01/04/2016: BUN 18; Creat 0.94; Hemoglobin 13.9; Platelets 154; Potassium 3.7; Sodium 141    Lipid Panel     Component Value Date/Time   CHOL  10/05/2010 0619    190        ATP III CLASSIFICATION:  <200     mg/dL   Desirable  200-239  mg/dL   Borderline High  >=240    mg/dL   High          TRIG 90 10/05/2010 0619   HDL 39* 10/05/2010 0619   CHOLHDL 4.9 10/05/2010 0619   VLDL 18 10/05/2010 0619   LDLCALC * 10/05/2010 0619    133        Total Cholesterol/HDL:CHD Risk Coronary Heart Disease Risk Table                     Men   Women  1/2 Average Risk    3.4   3.3  Average Risk       5.0   4.4  2 X Average Risk   9.6   7.1  3 X Average Risk  23.4   11.0        Use the calculated Patient Ratio above and the CHD Risk Table to determine the patient's CHD Risk.        ATP III CLASSIFICATION (LDL):  <100     mg/dL   Optimal  100-129  mg/dL   Near or Above                    Optimal  130-159  mg/dL   Borderline  160-189  mg/dL   High  >190     mg/dL   Very High     Wt Readings from Last 3 Encounters:  04/15/16 247 lb (112.038 kg)  12/22/15 248 lb (112.492 kg)  10/13/15 246 lb (111.585 kg)     ASSESSMENT AND PLAN:  1.  Nonischemic CM/ chronic systolic dysfunction/ LBBB Doing well at this time euvolemic today Normal BiV ICD function See Pace Art report No changes today Merlin Will enroll in ICM device clinic with Sharman Cheek Repeat BP by MD is 86/58 (her baseline)  2. afib Well controlled No sustained afib Not on anticoagulation (due to very low burden)  3. Obesity Weight loss is encouraged Body mass index is 44.43 kg/(m^2).  4. HTN Stable No change required today    Current medicines are reviewed at length with the patient today.  The patient does not have concerns regarding her medicines.  The following changes were made today:  none   Follow-up: return to see [name] NP in the device clinic in 12 months, Merlin, I will see in a year  Signed, Thompson Grayer, MD  04/15/2016 10:56 AM     [place] [address] [phone] (office) [phone] (fax)

## 2016-04-15 NOTE — Patient Instructions (Signed)
Medication Instructions:  Your physician recommends that you continue on your current medications as directed. Please refer to the Current Medication list given to you today.   Labwork: None ordered   Testing/Procedures: None ordered   Follow-Up: Your physician wants you to follow-up in: 12 months with [name], NP You will receive a reminder letter in the mail two months in advance. If you don't receive a letter, please call our office to schedule the follow-up appointment.  Remote monitoring is used to monitor your  ICD from home. This monitoring reduces the number of office visits required to check your device to one time per year. It allows [place] to keep an eye on the functioning of your device to ensure it is working properly. You are scheduled for a device check from home on 07/15/16. You may send your transmission at any time that day. If you have a wireless device, the transmission will be sent automatically. After your physician reviews your transmission, you will receive a postcard with your next transmission date.     Any Other Special Instructions Will Be Listed Below (If Applicable).     If you need a refill on your cardiac medications before your next appointment, please call your pharmacy.

## 2016-05-16 ENCOUNTER — Other Ambulatory Visit (HOSPITAL_COMMUNITY): Payer: Self-pay | Admitting: Internal Medicine

## 2016-05-25 ENCOUNTER — Encounter (HOSPITAL_COMMUNITY): Payer: Self-pay | Admitting: Internal Medicine

## 2016-05-25 ENCOUNTER — Ambulatory Visit (HOSPITAL_COMMUNITY)
Admission: RE | Admit: 2016-05-25 | Discharge: 2016-05-25 | Disposition: A | Discharge status: Home / Self Care | Payer: PRIVATE HEALTH INSURANCE | Source: Ambulatory Visit | Attending: Internal Medicine | Admitting: Internal Medicine

## 2016-05-25 VITALS — BP 98/60 | HR 61 | Wt 245.5 lb

## 2016-05-25 DIAGNOSIS — K219 Gastro-esophageal reflux disease without esophagitis: Secondary | ICD-10-CM | POA: Diagnosis not present

## 2016-05-25 DIAGNOSIS — I5022 Chronic systolic (congestive) heart failure: Secondary | ICD-10-CM | POA: Insufficient documentation

## 2016-05-25 DIAGNOSIS — Z79811 Long term (current) use of aromatase inhibitors: Secondary | ICD-10-CM | POA: Diagnosis not present

## 2016-05-25 DIAGNOSIS — I7100 Dissection of unspecified site of aorta: Secondary | ICD-10-CM | POA: Insufficient documentation

## 2016-05-25 DIAGNOSIS — Z853 Personal history of malignant neoplasm of breast: Secondary | ICD-10-CM | POA: Diagnosis not present

## 2016-05-25 DIAGNOSIS — I428 Other cardiomyopathies: Secondary | ICD-10-CM | POA: Insufficient documentation

## 2016-05-25 DIAGNOSIS — G4733 Obstructive sleep apnea (adult) (pediatric): Secondary | ICD-10-CM | POA: Insufficient documentation

## 2016-05-25 DIAGNOSIS — Z95810 Presence of automatic (implantable) cardiac defibrillator: Secondary | ICD-10-CM | POA: Insufficient documentation

## 2016-05-25 DIAGNOSIS — I71019 Dissection of thoracic aorta, unspecified: Secondary | ICD-10-CM

## 2016-05-25 DIAGNOSIS — E669 Obesity, unspecified: Secondary | ICD-10-CM | POA: Diagnosis not present

## 2016-05-25 DIAGNOSIS — Z86718 Personal history of other venous thrombosis and embolism: Secondary | ICD-10-CM | POA: Insufficient documentation

## 2016-05-25 DIAGNOSIS — I7101 Dissection of thoracic aorta: Secondary | ICD-10-CM | POA: Diagnosis not present

## 2016-05-25 DIAGNOSIS — Z923 Personal history of irradiation: Secondary | ICD-10-CM | POA: Insufficient documentation

## 2016-05-25 DIAGNOSIS — I110 Hypertensive heart disease with heart failure: Secondary | ICD-10-CM | POA: Diagnosis not present

## 2016-05-25 DIAGNOSIS — Z6841 Body Mass Index (BMI) 40.0 and over, adult: Secondary | ICD-10-CM | POA: Insufficient documentation

## 2016-05-25 DIAGNOSIS — E785 Hyperlipidemia, unspecified: Secondary | ICD-10-CM | POA: Insufficient documentation

## 2016-05-25 DIAGNOSIS — Z79899 Other long term (current) drug therapy: Secondary | ICD-10-CM | POA: Diagnosis not present

## 2016-05-25 DIAGNOSIS — H409 Unspecified glaucoma: Secondary | ICD-10-CM | POA: Diagnosis not present

## 2016-05-25 DIAGNOSIS — Z9221 Personal history of antineoplastic chemotherapy: Secondary | ICD-10-CM | POA: Insufficient documentation

## 2016-05-25 DIAGNOSIS — Z7982 Long term (current) use of aspirin: Secondary | ICD-10-CM | POA: Diagnosis not present

## 2016-05-25 NOTE — Patient Instructions (Signed)
Follow up and Echo with Dr.Bensimhon in 3-4 months.

## 2016-05-25 NOTE — Progress Notes (Signed)
ADVANCED HF CLINIC NOTE   Patient ID: [name], female   DOB: 03-02-1951, 65 y.o.   MRN: [id number] Oncologist: Dr. Philipp Ovens at [facility] EP: Dr. [name] Dr. Bea Graff in [place] HF: Ivon Roedel  HPI: [name] is a 65 y.o. female a cardiac history that includes HTN, Type B aortic dissection (2010) managed with medical therapy by Dr. Prescott Gum and has healed, LBBB, systolic heart failure due to NICM with EF 10-15% in May 2013 which has decreased from 65% in 2010.  Cath Dec 2011 showed normal arteries.  She was diagnosed with breast cancer in 2009 s/p lumpectomy, XRT, and chemotherapy.  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   She underwent St Jude Bi-V ICD implantation Sept 2012 but unable to place LV lead.  She eventually brought back in Nov 2012 for left thoracotomy and placement of epicardial LV lead placement by Dr. Prescott Gum.  She also had a sleep study in Jan that showed mild OSA.  No clinical intervention occurred.    She returns for HF follow up. Last visit corlanor to 7.5 mg twice a day. Overall feeling ok. Attending water therapy twice a week. Says she is feeling betters. Mild dyspnea with exertion. Denies PND/Orthopnea. Ongoing joint pain. Weight at home 239-240 pounds. Taking all medications.   Corvue- Impedance ok.    10/10/2012 CPX Peak VO2: 10.6 % predicted peak VO2: 64.4% VE/VCO2 slope: 26.9 OUES: 1.18 Peak RER: 1.29 Ventilatory Threshold: 7.3 % predicted peak VO2: 44.4% Peak RR 23 Peak Ventilation: 29.8 VE/MVV: 32.6% PETCO2 at peak: 43  01/2014 CPX Peak VO2: 10.3 (66.4% predicted peak VO2) VE/VCO2 slope: 25.0 OUES: 1.30 Peak RER: 1.20  CPX (8/16): Peak VO2 10.1 VE/VCO2 slope 28.4 RER 1.14 OUES 1.3 PFTs mildly restrictive No significant change compared to prior.  Limited more by obesity and restrictive lung physiology than heart (mild circulatory limitation).   ECHO 01/02/13 EF 0000000 Grade 1  diastolic dysfunction. RV ok. No PAH ECHO 07/10/13 EF 20% RV ok ECHO 10/13/14 EF 15-20% RV ok Echo 10/2015 EF 15%   Labs 06/01/13 Creatinine 1.2 Potassium 4.4 Labs 12/14 K 3.9, creatinine 1.2 Labs 01/17/14 K 4.5 Creatinine 1.15  Labs 10/15 K 4.5, creatinine 2.3 Labs 08/18/14 K 4.4 Creatinine 1.24  Labs 08/28/14 Creatinine 1.12  Labs 8/16 K 4, creatinine 1.0, HCT 40.9 Labs 09/21/15  K 3.6 creatinine 1.1 Labs 01/04/2016: K 3.7 Creatinine 0.94   ROS: All systems negative except as listed in HPI, PMH and Problem List.  Past Medical History:  Diagnosis Date  . Aortic dissection ([place]) 12/2008   Type B  . Arthritis    lower back  . Atrial fibrillation ([place])    post op (left thoracotomy) 11/12 req amio/DCCV  . Breast cancer ([place]) 07/2009   Stage 3 lumpectomy, radiation, chemotherapy; finished chemo October, felt to be in remission  . Cardiac defibrillator in place    St. Jude (JA) 9/12; LV lead placement unsuccessful;  s/p left thoracotomy with placement of epicardial LV lead 09/2011 (Dr. PVT)  . CHF (congestive heart failure) ([place])   . Chronic systolic dysfunction of left ventricle   . Depression   . DVT (deep venous thrombosis) ([place]) 10/18/2010  . GERD (gastroesophageal reflux disease)   . Glaucoma   . Hiatal hernia    small hiatal hernia  . History of shingles   . Hyperlipidemia   . Hypertension   . Left bundle branch block   .  Nonischemic cardiomyopathy ([place])    Admission 12/11 with EF 25%, normal coronary arteries by cath 12/11; NICM presumed to be secondary to chemotherapy for breast cancer  . Obesity   . [name]    with exertion    Current Outpatient Prescriptions  Medication Sig Dispense Refill  . albuterol (PROVENTIL) (2.5 MG/3ML) 0.083% nebulizer solution Take 2.5 mg by nebulization every 4 (four) hours as needed. [name]     . allopurinol (ZYLOPRIM) 300 MG tablet Take 300 mg by mouth daily.    [name] aspirin 81 MG EC tablet Take 81 mg by mouth  daily.      [name] azelastine (OPTIVAR) 0.05 % ophthalmic solution Place 1 drop into both eyes daily.    . Bepotastine Besilate (BEPREVE) 1.5 % SOLN Place 1 drop into both eyes daily as needed. Dry eyes     . bimatoprost (LUMIGAN) 0.03 % ophthalmic solution Place 1 drop into both eyes at bedtime. Reported on 01/12/2016    . Calcium Carbonate-Vitamin D (CALCIUM-VITAMIN D3) 600-200 MG-UNIT TABS Take 1 tablet by mouth 2 (two) times daily.      . carvedilol (COREG) 12.5 MG tablet Take 12.5 mg by mouth 2 (two) times daily with a meal.    . cholecalciferol (VITAMIN D) 1000 UNITS tablet Take 1,000 Units by mouth 2 (two) times daily.      . colchicine 0.6 MG tablet Take 0.6 mg by mouth daily as needed. (GOUT)  0  . diclofenac sodium (VOLTAREN) 1 % GEL Apply 1 g topically 3 (three) times daily as needed. (PAIN)    . docusate sodium (COLACE) 100 MG capsule Take 100 mg by mouth 2 (two) times daily.    . fexofenadine (ALLEGRA) 180 MG tablet Take 180 mg by mouth daily as needed. Allergies     . fluticasone (FLONASE) 50 MCG/ACT nasal spray Place 2 sprays into the nose daily as needed. Allergies     . furosemide (LASIX) 40 MG tablet Take 1 tablet (40 mg total) by mouth daily. 180 tablet 1  . HYDROcodone-acetaminophen (NORCO/VICODIN) 5-325 MG tablet as directed.     . ivabradine ([name]) 7.5 MG TABS tablet Take 7.5 mg by mouth 2 (two) times daily with a meal.    . letrozole (FEMARA) 2.5 MG tablet Take 2.5 mg by mouth every evening.      . montelukast (SINGULAIR) 10 MG tablet Take 10 mg by mouth at bedtime. For allergies & congestion    . Multiple Vitamin (MULTIVITAMIN) tablet Take 1 tablet by mouth daily.      [name] 20 MG capsule Take 20 mg by mouth 2 (two) times daily.      [name] PARoxetine (PAXIL) 20 MG tablet Take 20 mg by mouth daily.  1  . Polyethyl Glycol-Propyl Glycol (SYSTANE OP) Apply 1 drop to eye daily as needed (dry eyes).     [name] senna-docusate (SENOKOT-S) 8.6-50 MG per tablet Take 1  tablet by mouth daily as needed for mild constipation.     [name] spironolactone (ALDACTONE) 25 MG tablet Take 1 tablet (25 mg total) by mouth daily. 90 tablet 3  . traMADol (ULTRAM) 50 MG tablet Take 50 mg by mouth every 6 (six) hours as needed for pain.    . valsartan (DIOVAN) 40 MG tablet TAKE ONE TABLET BY MOUTH AT BEDTIME 90 tablet 3  . albuterol (PROVENTIL HFA;VENTOLIN HFA) 108 (90 BASE) MCG/ACT inhaler Inhale 2 puffs into the lungs every 4 (four) hours as needed. Shortness  of breath     . ALPRAZolam (XANAX) 1 MG tablet Take 0.5 mg by mouth daily as needed for anxiety. Anxiety     . cyclobenzaprine (FLEXERIL) 10 MG tablet Take 1 tablet (10 mg total) by mouth 3 (three) times daily as needed for muscle spasms. (Patient not taking: Reported on 05/25/2016) 30 tablet 0   No current facility-administered medications for this encounter.      PHYSICAL EXAM: Vitals:   05/25/16 1458  BP: 98/60  Pulse: 61  SpO2: 95%  Weight: 111.4 kg (245 lb 8 oz)    General:  Well appearing. No resp difficulty Husband present HEENT: normal Neck: supple. JVP difficult to assess due to body habitus but does not appear elevated. Carotids 2+ bilaterally; no bruits. No lymphadenopathy or thryomegaly appreciated. Cor: PMI normal. Regular rate & rhythm. No rubs, or murmurs. No S3.  Lungs: clear Abdomen: obese, soft, nontender, nondistended. No hepatosplenomegaly. No bruits or masses. Good bowel sounds. Extremities: no cyanosis, clubbing, rash.  Trace ankle edema.   Neuro: alert & orientedx3, cranial nerves grossly intact. Moves all 4 extremities w/o difficulty. Affect pleasant.  ASSESSMENT & PLAN: 1. Chronic systolic CHF: Nonischemic cardiomyopathy. Echo today EF 15%. RV normal. S/p St Jude Bi-V ICD with epicardial LV lead. CPX in 8/16 was similar to the past: it appeared that the major limitation was from body habitus/deconditioning and not from her heart.  NYHA class II-III symptoms.   Volume status stable.  Continue lasix 40 mg daily.    - Continue Coreg to 12.5 mg bid  - Continue  40 mg valsartan in the evening only, no BP room to increase currently may try to increase night dose at next visit.  - Continue spironolactone 25 daily.  - Continue [name] to 7. 5 mg bid.  2. Paroxysmal atrial fibrillation: Only 1 episode associated with stressor in past.  If recurrence is noted, will need anticoagulation. On aspirin 81 mg daily.  Most recent device interrogation in EP Clinic showed atrial tach and no atrial fibrillation. If has recurrent AF will need anticoagulation. CHADVSVASC 3 3. Type B aortic dissection: This has been stable.  BP is not elevated.    Amy Clegg  NP-C  05/25/2016    Patient seen and examined with Darrick Grinder, NP. We discussed all aspects of the encounter. I agree with the assessment and plan as stated above.   Echo images reviewed personally in clinic. Still with severe LV dysfunction but doing well from a functional perspective with NYHA II-early III symptoms. Able to do water exercises. Continue current HF regimen. Can repeat CPX as needed.   ICD interrogate personally and looks good. No VT. Impedance ok.   Wladyslawa Disbro,MD 12:35 AM

## 2016-06-14 ENCOUNTER — Other Ambulatory Visit (HOSPITAL_COMMUNITY): Payer: Self-pay | Admitting: Cardiology

## 2016-06-15 ENCOUNTER — Encounter: Payer: Self-pay | Admitting: Internal Medicine

## 2016-06-15 ENCOUNTER — Ambulatory Visit (INDEPENDENT_AMBULATORY_CARE_PROVIDER_SITE_OTHER): Payer: PRIVATE HEALTH INSURANCE

## 2016-06-15 DIAGNOSIS — I5022 Chronic systolic (congestive) heart failure: Secondary | ICD-10-CM | POA: Diagnosis not present

## 2016-06-15 DIAGNOSIS — Z95810 Presence of automatic (implantable) cardiac defibrillator: Secondary | ICD-10-CM

## 2016-06-15 NOTE — Progress Notes (Signed)
EPIC Encounter for ICM Monitoring  Patient Name: [name] is a 65 y.o. female Date: 06/15/2016 Primary Care Physican: [name], MD Primary Cardiologist: [place] Electrophysiologist: Allred Dry Weight: 237 lb  Bi-V Pacing:  81%       Heart Failure questions reviewed, pt symptomatic with some swelling of feet.  Thoracic impedance abnormal suggesting fluid accumulation.        Labs 8/16 K 4, creatinine 1.0, HCT 40.9       Labs 09/21/15  K 3.6 creatinine 1.1       Labs 01/04/2016: K 3.7 Creatinine 0.94   Recommendations:  Increase Furosemide 40 mg to bid x 2 days and then after 2nd day return to Furosemide 40 mg daily.  Repeat transmission 06/24/2016.  Follow-up plan: ICM clinic phone appointment on 06/24/2016.  Copy of ICM check sent to primary cardiologist and device physician.   ICM trend: 06/15/2016     DirectTrend Viewer  [name], RN 06/15/2016 10:18 AM

## 2016-06-17 ENCOUNTER — Other Ambulatory Visit (HOSPITAL_COMMUNITY): Payer: Self-pay | Admitting: Cardiology

## 2016-06-24 ENCOUNTER — Telehealth: Payer: Self-pay

## 2016-06-24 ENCOUNTER — Ambulatory Visit (INDEPENDENT_AMBULATORY_CARE_PROVIDER_SITE_OTHER): Payer: PRIVATE HEALTH INSURANCE

## 2016-06-24 DIAGNOSIS — I5022 Chronic systolic (congestive) heart failure: Secondary | ICD-10-CM

## 2016-06-24 DIAGNOSIS — Z95810 Presence of automatic (implantable) cardiac defibrillator: Secondary | ICD-10-CM

## 2016-06-24 NOTE — Telephone Encounter (Signed)
Remote ICM transmission received.  Attempted patient call and left message for return call.   

## 2016-06-24 NOTE — Progress Notes (Signed)
EPIC Encounter for ICM Monitoring  Patient Name: [name] is a 65 y.o. female Date: 06/24/2016 Primary Care Physican: [name], MD Primary Cardiologist: [place] Electrophysiologist: Allred Dry Weight: unknown Bi-V Pacing:  82%       Attempted ICM call and unable to reach.  Transmission reviewed.   Since last ICM transmission 06/17/2016 and increase of Furosemide, thoracic impedance returned to normal and above baseline due to extra Furosemide.   Follow-up plan: ICM clinic phone appointment on 07/25/2016.  Copy of ICM check sent to device physician.   ICM trend: 06/24/2016       Brittney Billings, RN 06/24/2016 12:09 PM

## 2016-07-25 ENCOUNTER — Ambulatory Visit (INDEPENDENT_AMBULATORY_CARE_PROVIDER_SITE_OTHER): Payer: PRIVATE HEALTH INSURANCE | Admitting: *Deleted

## 2016-07-25 DIAGNOSIS — Z95810 Presence of automatic (implantable) cardiac defibrillator: Secondary | ICD-10-CM | POA: Diagnosis not present

## 2016-07-25 DIAGNOSIS — I5022 Chronic systolic (congestive) heart failure: Secondary | ICD-10-CM

## 2016-07-25 DIAGNOSIS — I428 Other cardiomyopathies: Secondary | ICD-10-CM

## 2016-07-25 NOTE — Progress Notes (Signed)
EPIC Encounter for ICM Monitoring  Patient Name: [name] is a 65 y.o. female Date: 07/25/2016 Primary Care Physican: [name], MD Primary Cardiologist:Bensimhon Electrophysiologist: Allred Dry Weight:  241 lbs Bi-V Pacing:  83%                   Heart Failure questions reviewed, pt symptomatic with leg swelling and weight gain from  238.2 lbs to 241 lbs.   Thoracic impedance abnormal suggesting fluid accumulation.  Recommendations:  Patient took extra Lasix 2 days last week, 07/24/2016 and today and will take extra tablet tomorrow which will be 3 consecutive days.    Follow-up plan: ICM clinic phone appointment on 07/27/2016 to repeat fluid levels.  Copy of ICM check sent to primary cardiologist and device physician.   ICM trend: 07/25/2016       [name], RN 07/25/2016 4:13 PM

## 2016-07-25 NOTE — Progress Notes (Signed)
Remote ICD transmission.   

## 2016-07-27 ENCOUNTER — Encounter: Payer: Self-pay | Admitting: Cardiology

## 2016-07-27 ENCOUNTER — Telehealth: Payer: Self-pay | Admitting: Cardiology

## 2016-07-27 ENCOUNTER — Ambulatory Visit (INDEPENDENT_AMBULATORY_CARE_PROVIDER_SITE_OTHER): Payer: PRIVATE HEALTH INSURANCE

## 2016-07-27 DIAGNOSIS — I5022 Chronic systolic (congestive) heart failure: Secondary | ICD-10-CM

## 2016-07-27 DIAGNOSIS — Z95810 Presence of automatic (implantable) cardiac defibrillator: Secondary | ICD-10-CM

## 2016-07-27 NOTE — Telephone Encounter (Signed)
Spoke with pt and reminded pt of remote transmission that is due today. Pt verbalized understanding.   

## 2016-07-28 NOTE — Progress Notes (Signed)
EPIC Encounter for ICM Monitoring  Patient Name: [name] is a 65 y.o. female Date: 07/28/2016 Primary Care Physican: [name], MD Primary Cardiologist:Bensimhon Electrophysiologist: Allred Dry Weight: 238 lb  Bi-V Pacing:  83%       Heart Failure questions reviewed, pt leg swelling resolved and weight returned to baseline  Thoracic impedance normal after increase in Furosemide x 3 days 9/25 .  Recommendations: No changes.      Follow-up plan: ICM clinic phone appointment on 08/29/2016.  Copy of ICM check sent to primary cardiologist and device physician for updated transmission and impedance returned to normal.   ICM trend: 07/27/2016       [name], RN 07/28/2016 1:00 PM

## 2016-08-22 ENCOUNTER — Telehealth (HOSPITAL_COMMUNITY): Payer: Self-pay | Admitting: Pharmacist

## 2016-08-22 NOTE — Telephone Encounter (Signed)
[name] 7.5 mg BID PA approved by [employer] through 08/18/17.   Ruta Hinds. Velva Harman, PharmD, BCPS, CPP Clinical Pharmacist Pager: [phone] Phone: [phone] 08/22/2016 3:11 PM

## 2016-08-23 LAB — CUP PACEART REMOTE DEVICE CHECK
Battery Remaining Percentage: 90 %
Brady Statistic RA Percent Paced: 29 %
Brady Statistic RV Percent Paced: 83 %
Date Time Interrogation Session: 20171024123746
HighPow Impedance: 81 Ohm
Implantable Lead Implant Date: 20120914
Implantable Lead Location: 753860
Lead Channel Impedance Value: 400 Ohm
Lead Channel Sensing Intrinsic Amplitude: 12 mV
Lead Channel Setting Pacing Amplitude: 1.75 V
Lead Channel Setting Pacing Amplitude: 2.25 V
Lead Channel Setting Pacing Pulse Width: 0.5 ms
Lead Channel Setting Sensing Sensitivity: 0.5 mV
MDC IDC LEAD IMPLANT DT: 20120914
MDC IDC LEAD IMPLANT DT: 20121116
MDC IDC LEAD LOCATION: 753858
MDC IDC LEAD LOCATION: 753859
MDC IDC LEAD SERIAL: 194059
MDC IDC MSMT BATTERY REMAINING LONGEVITY: 69 mo
MDC IDC MSMT LEADCHNL LV IMPEDANCE VALUE: 290 Ohm
MDC IDC MSMT LEADCHNL LV PACING THRESHOLD AMPLITUDE: 2.125 V
MDC IDC MSMT LEADCHNL LV PACING THRESHOLD PULSEWIDTH: 0.5 ms
MDC IDC MSMT LEADCHNL RA IMPEDANCE VALUE: 360 Ohm
MDC IDC MSMT LEADCHNL RA PACING THRESHOLD AMPLITUDE: 0.75 V
MDC IDC MSMT LEADCHNL RA PACING THRESHOLD PULSEWIDTH: 0.5 ms
MDC IDC MSMT LEADCHNL RA SENSING INTR AMPL: 0.6 mV
MDC IDC MSMT LEADCHNL RV PACING THRESHOLD AMPLITUDE: 1.25 V
MDC IDC MSMT LEADCHNL RV PACING THRESHOLD PULSEWIDTH: 0.7 ms
MDC IDC SET LEADCHNL LV PACING AMPLITUDE: 2.5 V
MDC IDC SET LEADCHNL RV PACING PULSEWIDTH: 0.7 ms
Pulse Gen Serial Number: 7328650

## 2016-08-29 ENCOUNTER — Ambulatory Visit (INDEPENDENT_AMBULATORY_CARE_PROVIDER_SITE_OTHER): Payer: PRIVATE HEALTH INSURANCE

## 2016-08-29 DIAGNOSIS — Z95810 Presence of automatic (implantable) cardiac defibrillator: Secondary | ICD-10-CM

## 2016-08-29 DIAGNOSIS — I5022 Chronic systolic (congestive) heart failure: Secondary | ICD-10-CM

## 2016-08-29 NOTE — Progress Notes (Signed)
EPIC Encounter for ICM Monitoring  Patient Name: LANEISHA HOLDERBAUM is a 65 y.o. female Date: 08/29/2016 Primary Care Physican: [name], MD Primary Cardiologist: [place] Electrophysiologist: Allred Dry Weight:    237 lb  Bi-V Pacing:  85%             Heart Failure questions reviewed, pt symptomatic with swelling with ankles and hands   Thoracic impedance abnormal suggesting fluid accumulation within the last few days.  Patient reported she has been on vacation for the last 3-4 days which caused fluid accumulation.        Labs 8/16 K 4, creatinine 1.0, HCT 40.9       Labs 09/21/15 K 3.6 creatinine 1.1       Labs 01/04/2016: K 3.7 Creatinine 0.94   Recommendations:  Patient had taken extra 20 mg of Furosemide yesterday and this morning for a total of 60 mg.  Advised to take 20 mg this afternoon and tomorrow to take 40 mg in am and 40 mg in pm.  Advised to return to prescribed dosage of Furosemide 40 mg daily.    Follow-up plan: ICM clinic phone appointment on 09/01/2016.  Copy of ICM check sent to primary cardiologist and device physician.   ICM trend: 08/29/2016       [name], RN 08/29/2016 12:30 PM

## 2016-08-30 ENCOUNTER — Telehealth: Payer: Self-pay | Admitting: Rheumatology

## 2016-08-30 NOTE — Telephone Encounter (Signed)
Patient would like to start the process for Euflexxa injections.

## 2016-08-30 NOTE — Telephone Encounter (Signed)
Patient completed Euflexxa on 03/22/16 Would like to repeat

## 2016-08-30 NOTE — Telephone Encounter (Signed)
PLEASE APPLY FOR EUFLEXXA X 3 (BOTH KNEES IF WE DID BOTH KNEES BEFORE) AND WE CAN START INJECTIONS IN 2018 AT NEXT AVAILABLE SLOT. SCHEDULE PRIOR AUTH TO START AFTER September 22, 2016.

## 2016-09-01 ENCOUNTER — Ambulatory Visit (INDEPENDENT_AMBULATORY_CARE_PROVIDER_SITE_OTHER): Payer: PRIVATE HEALTH INSURANCE

## 2016-09-01 DIAGNOSIS — I5022 Chronic systolic (congestive) heart failure: Secondary | ICD-10-CM

## 2016-09-01 DIAGNOSIS — Z95810 Presence of automatic (implantable) cardiac defibrillator: Secondary | ICD-10-CM

## 2016-09-01 NOTE — Progress Notes (Signed)
EPIC Encounter for ICM Monitoring  Patient Name: [name] is a 65 y.o. female Date: 09/01/2016 Primary Care Physican: [name], MD Primary Cardiologist:Bensimhon Electrophysiologist: Allred Dry Weight: 235lb  Bi-V Pacing:  85%       Heart Failure questions reviewed, pt asymptomatic and finger swelling has resolved  Thoracic impedance returned to normal after taking extra Furosemide x 3 days.        Labs 8/16 K 4, creatinine 1.0, HCT 40.9 Labs 09/21/15 K 3.6 creatinine 1.1 Labs 01/04/2016: K 3.7 Creatinine 0.94   Recommendations:  No changes.  Advised to limit salt intake to 2000 mg daily.  Patient very aware of amount of salt in foods and reads food labels.  She has decreased frequency of eating meals out in the restaurants.  Encouraged to call for fluid symptoms.    Follow-up plan: ICM clinic phone appointment on 10/03/2016.  Copy of ICM check sent to device physician.   ICM trend: 09/01/2016       [name], RN 09/01/2016 10:07 AM

## 2016-09-07 ENCOUNTER — Ambulatory Visit: Payer: Self-pay | Admitting: Rheumatology

## 2016-09-15 ENCOUNTER — Other Ambulatory Visit: Payer: Self-pay | Admitting: Rheumatology

## 2016-09-15 ENCOUNTER — Other Ambulatory Visit (HOSPITAL_COMMUNITY): Payer: Self-pay | Admitting: Internal Medicine

## 2016-09-19 ENCOUNTER — Other Ambulatory Visit (HOSPITAL_COMMUNITY): Payer: Self-pay | Admitting: Internal Medicine

## 2016-09-30 ENCOUNTER — Ambulatory Visit (INDEPENDENT_AMBULATORY_CARE_PROVIDER_SITE_OTHER)
Admission: RE | Admit: 2016-09-30 | Discharge: 2016-09-30 | Disposition: A | Discharge status: Home / Self Care | Payer: PRIVATE HEALTH INSURANCE | Source: Ambulatory Visit | Attending: Internal Medicine | Admitting: Internal Medicine

## 2016-09-30 ENCOUNTER — Ambulatory Visit (HOSPITAL_COMMUNITY)
Admission: RE | Admit: 2016-09-30 | Discharge: 2016-09-30 | Disposition: A | Discharge status: Home / Self Care | Payer: PRIVATE HEALTH INSURANCE | Source: Ambulatory Visit | Attending: Internal Medicine | Admitting: Internal Medicine

## 2016-09-30 ENCOUNTER — Other Ambulatory Visit (HOSPITAL_COMMUNITY): Payer: Self-pay | Admitting: Cardiology

## 2016-09-30 ENCOUNTER — Encounter (HOSPITAL_COMMUNITY): Payer: Self-pay | Admitting: Internal Medicine

## 2016-09-30 VITALS — BP 104/62 | HR 58 | Wt 247.2 lb

## 2016-09-30 DIAGNOSIS — Z6841 Body Mass Index (BMI) 40.0 and over, adult: Secondary | ICD-10-CM | POA: Insufficient documentation

## 2016-09-30 DIAGNOSIS — Z95810 Presence of automatic (implantable) cardiac defibrillator: Secondary | ICD-10-CM

## 2016-09-30 DIAGNOSIS — Z7982 Long term (current) use of aspirin: Secondary | ICD-10-CM | POA: Insufficient documentation

## 2016-09-30 DIAGNOSIS — K449 Diaphragmatic hernia without obstruction or gangrene: Secondary | ICD-10-CM | POA: Insufficient documentation

## 2016-09-30 DIAGNOSIS — I428 Other cardiomyopathies: Secondary | ICD-10-CM

## 2016-09-30 DIAGNOSIS — K219 Gastro-esophageal reflux disease without esophagitis: Secondary | ICD-10-CM | POA: Insufficient documentation

## 2016-09-30 DIAGNOSIS — Z853 Personal history of malignant neoplasm of breast: Secondary | ICD-10-CM | POA: Insufficient documentation

## 2016-09-30 DIAGNOSIS — H409 Unspecified glaucoma: Secondary | ICD-10-CM | POA: Insufficient documentation

## 2016-09-30 DIAGNOSIS — E785 Hyperlipidemia, unspecified: Secondary | ICD-10-CM | POA: Diagnosis not present

## 2016-09-30 DIAGNOSIS — Z9221 Personal history of antineoplastic chemotherapy: Secondary | ICD-10-CM | POA: Insufficient documentation

## 2016-09-30 DIAGNOSIS — I447 Left bundle-branch block, unspecified: Secondary | ICD-10-CM

## 2016-09-30 DIAGNOSIS — G4733 Obstructive sleep apnea (adult) (pediatric): Secondary | ICD-10-CM | POA: Diagnosis not present

## 2016-09-30 DIAGNOSIS — Z9889 Other specified postprocedural states: Secondary | ICD-10-CM | POA: Diagnosis not present

## 2016-09-30 DIAGNOSIS — I5022 Chronic systolic (congestive) heart failure: Secondary | ICD-10-CM | POA: Insufficient documentation

## 2016-09-30 DIAGNOSIS — F329 Major depressive disorder, single episode, unspecified: Secondary | ICD-10-CM | POA: Insufficient documentation

## 2016-09-30 DIAGNOSIS — Z8619 Personal history of other infectious and parasitic diseases: Secondary | ICD-10-CM | POA: Diagnosis not present

## 2016-09-30 DIAGNOSIS — I509 Heart failure, unspecified: Secondary | ICD-10-CM | POA: Diagnosis not present

## 2016-09-30 DIAGNOSIS — E669 Obesity, unspecified: Secondary | ICD-10-CM | POA: Insufficient documentation

## 2016-09-30 DIAGNOSIS — I480 Paroxysmal atrial fibrillation: Secondary | ICD-10-CM | POA: Diagnosis not present

## 2016-09-30 DIAGNOSIS — I429 Cardiomyopathy, unspecified: Secondary | ICD-10-CM | POA: Insufficient documentation

## 2016-09-30 DIAGNOSIS — I110 Hypertensive heart disease with heart failure: Secondary | ICD-10-CM | POA: Diagnosis not present

## 2016-09-30 DIAGNOSIS — I82409 Acute embolism and thrombosis of unspecified deep veins of unspecified lower extremity: Secondary | ICD-10-CM | POA: Diagnosis not present

## 2016-09-30 LAB — BASIC METABOLIC PANEL
Anion gap: 8 (ref 5–15)
BUN: 17 mg/dL (ref 6–20)
CALCIUM: 9.4 mg/dL (ref 8.9–10.3)
CHLORIDE: 102 mmol/L (ref 101–111)
CO2: 29 mmol/L (ref 22–32)
CREATININE: 1.21 mg/dL — AB (ref 0.44–1.00)
GFR calc non Af Amer: 46 mL/min — ABNORMAL LOW (ref >60)
GFR, EST AFRICAN AMERICAN: 53 mL/min — AB (ref >60)
GLUCOSE: 84 mg/dL (ref 65–99)
Potassium: 3.7 mmol/L (ref 3.5–5.1)
Sodium: 139 mmol/L (ref 135–145)

## 2016-09-30 LAB — ECHOCARDIOGRAM COMPLETE
CHL CUP DOP CALC LVOT VTI: 15.4 cm
EERAT: 10.49
EWDT: 243 ms
FS: 7 % — AB (ref 28–44)
IV/PV OW: 0.95
LA diam index: 2 cm/m2
LA vol A4C: 51 ml
LASIZE: 42 mm
LEFT ATRIUM END SYS DIAM: 42 mm
LV E/e' medial: 10.49
LVEEAVG: 10.49
LVELAT: 7.4 cm/s
LVOT area: 3.8 cm2
LVOT diameter: 22 mm
LVOTPV: 90.9 cm/s
LVOTSV: 59 mL
MV Dec: 243
MV pk A vel: 92.2 m/s
MVPG: 2 mmHg
MVPKEVEL: 77.6 m/s
PW: 11.3 mm — AB (ref 0.6–1.1)
TDI e' lateral: 7.4
TDI e' medial: 4.24
WEIGHTICAEL: 3956 [oz_av]

## 2016-09-30 MED ORDER — PERFLUTREN LIPID MICROSPHERE
1.0000 mL | INTRAVENOUS | Status: AC | PRN
  Administered 2016-09-30: 2 mL via INTRAVENOUS
  Filled 2016-09-30: qty 10

## 2016-09-30 NOTE — Progress Notes (Signed)
ADVANCED HF CLINIC NOTE   Patient ID: [name], female   DOB: 1951-03-17, 65 y.o.   MRN: [id number] Oncologist: Dr. Philipp Ovens at [facility] EP: Dr. [name] Dr. Bea Graff in [place] HF: Bensimhon  HPI: [name] is a 66 y.o. female a cardiac history that includes HTN, Type B aortic dissection (2010) managed with medical therapy by Dr. Prescott Gum and has healed, LBBB, systolic heart failure due to NICM with EF 10-15% in May 2013 which has decreased from 65% in 2010.  Cath Dec 2011 showed normal arteries.  She was diagnosed with breast cancer in 2009 s/p lumpectomy, XRT, and chemotherapy.  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   She underwent St Jude Bi-V ICD implantation Sept 2012 but unable to place LV lead.  She eventually brought back in Nov 2012 for left thoracotomy and placement of epicardial LV lead placement by Dr. Prescott Gum.  She also had a sleep study in Jan that showed mild OSA.  No clinical intervention occurred.    She returns for HF follow up. Recently completed cancer follow up at [facility]. She remains cancer free. Overall feeling ok. Remains active with [employer] in [place]. Denies SOB/PND/Orthopnea. SBP at home 90-110. Ongoing joint pain. Unable to complete water aerobics. Weight at home 237-238 pounds. Taking all medications.    10/10/2012 CPX Peak VO2: 10.6 % predicted peak VO2: 64.4% VE/VCO2 slope: 26.9 OUES: 1.18 Peak RER: 1.29 Ventilatory Threshold: 7.3 % predicted peak VO2: 44.4% Peak RR 23 Peak Ventilation: 29.8 VE/MVV: 32.6% PETCO2 at peak: 43  01/2014 CPX Peak VO2: 10.3 (66.4% predicted peak VO2) VE/VCO2 slope: 25.0 OUES: 1.30 Peak RER: 1.20  CPX (8/16): Peak VO2 10.1 VE/VCO2 slope 28.4 RER 1.14 OUES 1.3 PFTs mildly restrictive No significant change compared to prior.  Limited more by obesity and restrictive lung physiology than heart (mild circulatory limitation).   ECHO 01/02/13 EF  0000000 Grade 1 diastolic dysfunction. RV ok. No PAH ECHO 07/10/13 EF 20% RV ok ECHO 10/13/14 EF 15-20% RV ok Echo 10/2015 EF 15%   Labs 06/01/13 Creatinine 1.2 Potassium 4.4 Labs 12/14 K 3.9, creatinine 1.2 Labs 01/17/14 K 4.5 Creatinine 1.15  Labs 10/15 K 4.5, creatinine 2.3 Labs 08/18/14 K 4.4 Creatinine 1.24  Labs 08/28/14 Creatinine 1.12  Labs 8/16 K 4, creatinine 1.0, HCT 40.9 Labs 09/21/15  K 3.6 creatinine 1.1 Labs 01/04/2016: K 3.7 Creatinine 0.94   ROS: All systems negative except as listed in HPI, PMH and Problem List.  Past Medical History:  Diagnosis Date  . Aortic dissection ([place]) 12/2008   Type B  . Arthritis    lower back  . Atrial fibrillation ([place])    post op (left thoracotomy) 11/12 req amio/DCCV  . Breast cancer ([place]) 07/2009   Stage 3 lumpectomy, radiation, chemotherapy; finished chemo October, felt to be in remission  . Cardiac defibrillator in place    St. Jude (JA) 9/12; LV lead placement unsuccessful;  s/p left thoracotomy with placement of epicardial LV lead 09/2011 (Dr. PVT)  . CHF (congestive heart failure) ([place])   . Chronic systolic dysfunction of left ventricle   . Depression   . DVT (deep venous thrombosis) ([place]) 10/18/2010  . GERD (gastroesophageal reflux disease)   . Glaucoma   . Hiatal hernia    small hiatal hernia  . History of shingles   . Hyperlipidemia   . Hypertension   . Left bundle branch block   .  Nonischemic cardiomyopathy ([place])    Admission 12/11 with EF 25%, normal coronary arteries by cath 12/11; NICM presumed to be secondary to chemotherapy for breast cancer  . Obesity   . [name]    with exertion    Current Outpatient Prescriptions  Medication Sig Dispense Refill  . albuterol (PROVENTIL HFA;VENTOLIN HFA) 108 (90 BASE) MCG/ACT inhaler Inhale 2 puffs into the lungs every 4 (four) hours as needed. [name]     . albuterol (PROVENTIL) (2.5 MG/3ML) 0.083% nebulizer solution Take 2.5 mg by nebulization every 4  (four) hours as needed. [name]     . allopurinol (ZYLOPRIM) 300 MG tablet Take 300 mg by mouth daily.    [name] ALPRAZolam (XANAX) 1 MG tablet Take 0.5 mg by mouth daily as needed for anxiety. Anxiety     . aspirin 81 MG EC tablet Take 81 mg by mouth daily.      [name] azelastine (OPTIVAR) 0.05 % ophthalmic solution Place 1 drop into both eyes daily.    . Bepotastine Besilate (BEPREVE) 1.5 % SOLN Place 1 drop into both eyes daily as needed. Dry eyes     . bimatoprost (LUMIGAN) 0.03 % ophthalmic solution Place 1 drop into both eyes at bedtime. Reported on 01/12/2016    . Calcium Carbonate-Vitamin D (CALCIUM-VITAMIN D3) 600-200 MG-UNIT TABS Take 1 tablet by mouth 2 (two) times daily.      . carvedilol (COREG) 12.5 MG tablet Take 12.5 mg by mouth 2 (two) times daily with a meal.    . cholecalciferol (VITAMIN D) 1000 UNITS tablet Take 1,000 Units by mouth 2 (two) times daily.      . colchicine 0.6 MG tablet Take 0.6 mg by mouth daily as needed. (GOUT)  0  . [name] 7.5 MG TABS tablet TAKE ONE TABLET TWICE DAILY WITH A MEAL 60 tablet 3  . cyclobenzaprine (FLEXERIL) 10 MG tablet Take 1 tablet (10 mg total) by mouth 3 (three) times daily as needed for muscle spasms. 30 tablet 0  . diclofenac sodium (VOLTAREN) 1 % GEL APPLY THREE GRAMS TO THREE LARGE JOINTS UP TO THREE TIMES DAILY AS NEEDED 500 g 3  . docusate sodium (COLACE) 100 MG capsule Take 100 mg by mouth 2 (two) times daily.    . fexofenadine (ALLEGRA) 180 MG tablet Take 180 mg by mouth daily as needed. Allergies     . fluticasone (FLONASE) 50 MCG/ACT nasal spray Place 2 sprays into the nose daily as needed. Allergies     . furosemide (LASIX) 40 MG tablet Take 1 tablet (40 mg total) by mouth daily. 90 tablet 3  . letrozole (FEMARA) 2.5 MG tablet Take 2.5 mg by mouth every evening.      . montelukast (SINGULAIR) 10 MG tablet Take 10 mg by mouth at bedtime. For allergies & congestion    . Multiple Vitamin (MULTIVITAMIN) tablet Take 1 tablet  by mouth daily.      [name] 20 MG capsule Take 20 mg by mouth 2 (two) times daily.      [name] PARoxetine (PAXIL) 20 MG tablet Take 20 mg by mouth daily.  1  . Polyethyl Glycol-Propyl Glycol (SYSTANE OP) Apply 1 drop to eye daily as needed (dry eyes).     [name] senna-docusate (SENOKOT-S) 8.6-50 MG per tablet Take 1 tablet by mouth daily as needed for mild constipation.     [name] spironolactone (ALDACTONE) 25 MG tablet TAKE ONE TABLET BY MOUTH EVERY DAY 90 tablet 3  .  traMADol (ULTRAM) 50 MG tablet Take 50 mg by mouth every 6 (six) hours as needed for pain.    . valsartan (DIOVAN) 40 MG tablet TAKE ONE TABLET BY MOUTH AT BEDTIME 90 tablet 3   No current facility-administered medications for this encounter.    Facility-Administered Medications Ordered in Other Encounters  Medication Dose Route Frequency Provider Last Rate Last Dose  . perflutren lipid microspheres (DEFINITY) IV suspension  1-10 mL Intravenous PRN Jolaine Artist, MD   2 mL at 09/30/16 1344   Filed Weights   09/30/16 1407  Weight: 247 lb 4 oz (112.2 kg)    PHYSICAL EXAM: Vitals:   09/30/16 1407  BP: 104/62  Pulse: (!) 58  SpO2: 98%  Weight: 247 lb 4 oz (112.2 kg)    General:  Well appearing. No resp difficulty Husband present HEENT: normal Neck: supple. JVP 6-7.  Carotids 2+ bilaterally; no bruits. No lymphadenopathy or thryomegaly appreciated. Cor: PMI normal. Regular rate & rhythm. No rubs, or murmurs. No S3.  Lungs: clear Abdomen: obese, soft, nontender, nondistended. No hepatosplenomegaly. No bruits or masses. Good bowel sounds. Extremities: no cyanosis, clubbing, rash.  Trace ankle edema.   Neuro: alert & orientedx3, cranial nerves grossly intact. Moves all 4 extremities w/o difficulty. Affect pleasant.  ASSESSMENT & PLAN: 1. Chronic systolic CHF: Nonischemic cardiomyopathy. Echo July 2017 EF 15%. RV normal. S/p St Jude Bi-V ICD with epicardial LV lead. CPX in 8/16 was similar to the past: it appeared  that the major limitation was from body habitus/deconditioning and not from her heart.  NYHA class II-III symptoms.  She remains stable. Volume status stable. Continue lasix 40 mg daily.    - Continue Coreg to 12.5 mg bid  - Continue  40 mg valsartan in the evening only, no BP room to increase.  - Continue spironolactone 25 daily.  - Continue [name] to 7. 5 mg bid.  2. Paroxysmal atrial fibrillation: regular pulse. On aspirin 81 mg daily.  If has recurrent AF will need anticoagulation. CHADVSVASC 3 3. Type B aortic dissection: This has been stable.  BP is not elevated.    Follow up in 4 months. Check BMET today.  Todays ECHO remains the same 10-15% RV normal.   Tasheika Kitzmiller  NP-C  09/30/2016

## 2016-09-30 NOTE — Progress Notes (Signed)
  Echocardiogram 2D Echocardiogram with Definity has been performed.  Tresa Res 09/30/2016, 2:46 PM

## 2016-09-30 NOTE — Patient Instructions (Signed)
Routine lab work today. Will notify you of abnormal results, otherwise no news is good news!  Follow up 4 months with Dr. Bensimhon.  Do the following things EVERYDAY: 1) Weigh yourself in the morning before breakfast. Write it down and keep it in a log. 2) Take your medicines as prescribed 3) Eat low salt foods-Limit salt (sodium) to 2000 mg per day.  4) Stay as active as you can everyday 5) Limit all fluids for the day to less than 2 liters 

## 2016-10-03 ENCOUNTER — Ambulatory Visit (INDEPENDENT_AMBULATORY_CARE_PROVIDER_SITE_OTHER): Payer: PRIVATE HEALTH INSURANCE

## 2016-10-03 DIAGNOSIS — Z95810 Presence of automatic (implantable) cardiac defibrillator: Secondary | ICD-10-CM | POA: Diagnosis not present

## 2016-10-03 DIAGNOSIS — M1A00X0 Idiopathic chronic gout, unspecified site, without tophus (tophi): Secondary | ICD-10-CM | POA: Insufficient documentation

## 2016-10-03 DIAGNOSIS — I5022 Chronic systolic (congestive) heart failure: Secondary | ICD-10-CM | POA: Diagnosis not present

## 2016-10-03 DIAGNOSIS — M1611 Unilateral primary osteoarthritis, right hip: Secondary | ICD-10-CM | POA: Insufficient documentation

## 2016-10-03 DIAGNOSIS — M170 Bilateral primary osteoarthritis of knee: Secondary | ICD-10-CM | POA: Insufficient documentation

## 2016-10-03 NOTE — Progress Notes (Signed)
Office Visit Note  Patient: [name]             Date of Birth: 1951-02-03           MRN: [id number]             PCP: [name], MD Referring: [name], MD Visit Date: 10/05/2016 Occupation: @GUAROCC @    Subjective:  Knee pain  History of Present Illness: [name] is a 65 y.o. female with history of gout and osteoarthritis. She states she's been having discomfort in her bilateral knee joints. She had order therapy for her lower back pain which was very helpful. She still continues to have some discomfort in her lower back. She is been having problems with her right shoulder for which she saw the chiropractor and he felt that she may have to rotator cuff tendinopathy. She denies any gout flare since the last visit.  Activities of Daily Living:  Patient reports morning stiffness for 45 minutes.   Patient Reports nocturnal pain.  Difficulty dressing/grooming: Denies Difficulty climbing stairs: Reports Difficulty getting out of chair: Reports Difficulty using hands for taps, buttons, cutlery, and/or writing: Denies   Review of Systems  Constitutional: Positive for fatigue. Negative for night sweats, weight gain, weight loss and weakness.  HENT: Positive for mouth dryness. Negative for mouth sores, trouble swallowing, trouble swallowing and nose dryness.   Eyes: Negative for pain, redness, visual disturbance and dryness.  Respiratory: Negative for cough, shortness of breath and difficulty breathing.   Cardiovascular: Negative for chest pain, palpitations, hypertension, irregular heartbeat and swelling in legs/feet.  Gastrointestinal: Negative for blood in stool, constipation and diarrhea.  Endocrine: Negative for increased urination.  Genitourinary: Negative for vaginal dryness.  Musculoskeletal: Positive for arthralgias, joint pain, myalgias, morning stiffness and myalgias. Negative for joint swelling, muscle weakness and muscle tenderness.  Skin: Negative for  color change, rash, hair loss, skin tightness, ulcers and sensitivity to sunlight.  Allergic/Immunologic: Negative for susceptible to infections.  Neurological: Negative for dizziness, memory loss and night sweats.  Hematological: Negative for swollen glands.  Psychiatric/Behavioral: Positive for sleep disturbance. Negative for depressed mood. The patient is nervous/anxious.     PMFS History:  Patient Active Problem List   Diagnosis Date Noted  . Encounter for long-term current use of medication 10/05/2016  . Acute pain of right shoulder 10/05/2016  . Stage 2 chronic kidney disease 10/05/2016  . Malignant neoplasm of right female breast ([place]) 10/05/2016  . Glaucoma of both eyes 10/05/2016  . Anxiety 10/05/2016  . Renal calcinosis 10/05/2016  . Dyslipidemia 10/05/2016  . Osteoarthritis of both knees 10/03/2016  . Idiopathic chronic gout, unspecified site, without tophus (tophi) 10/03/2016  . Unilateral primary osteoarthritis, right hip 10/03/2016  . Hypotension 09/26/2012  . ICD-St.Jude 06/21/2012  . Non-ischemic cardiomyopathy ([place]) 03/27/2012  . A-fib ([place]) 09/20/2011  . Chronic systolic heart failure ([place])   . OBSTRUCTIVE SLEEP APNEA 10/28/2010  . Secondary cardiomyopathy ([place]) 10/19/2010  . DVT 10/19/2010  . LBBB (left bundle branch block) 08/18/2010  . Morbid (severe) obesity due to excess calories ([place]) 08/17/2010  . DEPRESSION 08/17/2010  . OTHER CHRONIC PAIN 08/17/2010  . GLAUCOMA 08/17/2010  . Essential hypertension 08/17/2010  . Dissection of aorta ([place]) 08/17/2010  . SHINGLES, HX OF 08/17/2010    Past Medical History:  Diagnosis Date  . Aortic dissection ([place]) 12/2008   Type B  . Arthritis    lower back  . Atrial fibrillation ([place])    post  op (left thoracotomy) 11/12 req amio/DCCV  . Breast cancer ([place]) 07/2009   Stage 3 lumpectomy, radiation, chemotherapy; finished chemo October, felt to be in remission  . Cardiac defibrillator in place    St. Jude (JA)  9/12; LV lead placement unsuccessful;  s/p left thoracotomy with placement of epicardial LV lead 09/2011 (Dr. PVT)  . CHF (congestive heart failure) ([place])   . Chronic systolic dysfunction of left ventricle   . Depression   . DVT (deep venous thrombosis) ([place]) 10/18/2010  . GERD (gastroesophageal reflux disease)   . Glaucoma   . Hiatal hernia    small hiatal hernia  . History of shingles   . Hyperlipidemia   . Hypertension   . Left bundle branch block   . Nonischemic cardiomyopathy ([place])    Admission 12/11 with EF 25%, normal coronary arteries by cath 12/11; NICM presumed to be secondary to chemotherapy for breast cancer  . Obesity   . [name]    with exertion    Family History  Problem Relation Age of Onset  . Hypertension Mother   . Hypertension Father   . Hypertension Brother   . Hypertension Maternal Grandfather   . Heart attack Maternal Grandfather     MI  . Coronary artery disease Maternal Grandfather   . Hypertension Paternal Grandfather   . Heart attack Paternal Grandfather     MI  . Coronary artery disease Paternal Grandfather   . Hypertension Brother    Past Surgical History:  Procedure Laterality Date  . BREAST LUMPECTOMY     Right breast  . CARDIAC CATHETERIZATION     2011  . CARDIAC DEFIBRILLATOR PLACEMENT    . EP IMPLANTABLE DEVICE N/A 01/12/2016   STJ ICD Unify Assura gen change, Dr. [name]  . OOPHORECTOMY     Single  . THORACOTOMY  09/16/2011   Procedure: THORACOTOMY MAJOR;  Surgeon: Tharon Aquas Adelene Idler, MD;  [facility];  Service: Thoracic;  Laterality: Left;  left anterolateral Thoracotomy for placement of St. Jude epicardial pacing lead   . TONSILLECTOMY    . TUBAL LIGATION     Bilateral   Social History   Social History Narrative   Married   One child   Lives in [place] with spouse and mother in law   Previously worked as a Art gallery manager for [employer]      She is a Leisure centre manager counsel member.     Objective: Vital  Signs: BP 118/62 (BP Location: Left Arm, Patient Position: Sitting, Cuff Size: Large)   Resp 14   Ht 5' 2"  (1.575 m)   Wt 250 lb (113.4 kg)   BMI 45.73 kg/m    Physical Exam  Constitutional: She is oriented to person, place, and time. She appears well-developed and well-nourished.  HENT:  Head: Normocephalic and atraumatic.  Eyes: Conjunctivae and EOM are normal.  Neck: Normal range of motion.  Cardiovascular: Normal rate, regular rhythm, normal heart sounds and intact distal pulses.   Pulmonary/Chest: Effort normal and breath sounds normal.  Abdominal: Soft. Bowel sounds are normal.  Lymphadenopathy:    She has no cervical adenopathy.  Neurological: She is alert and oriented to person, place, and time.  Skin: Skin is warm and dry. Capillary refill takes less than 2 seconds.  Psychiatric: She has a normal mood and affect. Her behavior is normal.  Nursing note and vitals reviewed.    Musculoskeletal Exam: C-spine some limitation of range of motion, thoracic and lumbar spine  good range of motion. She is discomfort with range of motion of her right shoulder joint. Left shoulder joint good range of motion. Elbow joints good range of motion. Bilateral wrists joint MCPs PIPs DIPs good range of motion with no synovitis. Hip joints, knee joints, ankle joints good range of motion with no synovitis she is crepitus with knee with range of motion of bilateral knee joints. No warmth swelling or effusion was noted.  CDAI Exam: No CDAI exam completed.    Investigation: Findings:  06/26/2014 X-rays of the bilateral knees show moderate to severe medial compartment narrowing bilaterally with patellofemoral joint space narrowing bilaterally.    Hospital Outpatient Visit on 09/30/2016  Component Date Value Ref Range Status  . Sodium 09/30/2016 139  135 - 145 mmol/L Final  . Potassium 09/30/2016 3.7  3.5 - 5.1 mmol/L Final  . Chloride 09/30/2016 102  101 - 111 mmol/L Final  . CO2 09/30/2016 29  22  - 32 mmol/L Final  . Glucose, Bld 09/30/2016 84  65 - 99 mg/dL Final  . BUN 09/30/2016 17  6 - 20 mg/dL Final  . Creatinine, Ser 09/30/2016 1.21* 0.44 - 1.00 mg/dL Final  . Calcium 09/30/2016 9.4  8.9 - 10.3 mg/dL Final  . GFR calc non Af Amer 09/30/2016 46* >60 mL/min Final  . GFR calc Af Amer 09/30/2016 53* >60 mL/min Final   Comment: (NOTE) The eGFR has been calculated using the CKD EPI equation. This calculation has not been validated in all clinical situations. eGFR's persistently <60 mL/min signify possible Chronic Kidney Disease.   Georgiann Hahn gap 09/30/2016 8  5 - 15 Final  Hospital Outpatient Visit on 09/30/2016  Component Date Value Ref Range Status  . Weight 09/30/2016 3956  oz Final  . BP 09/30/2016 104/62  mmHg Final  . LV PW d 09/30/2016 11.3* 0.6 - 1.1 mm Final  . FS 09/30/2016 7* 28 - 44 % Final  . LA ID, A-P, ES 09/30/2016 42  mm Final  . IVS/LV PW RATIO, ED 09/30/2016 .95   Final  . LVOT VTI 09/30/2016 15.4  cm Final  . LV e' LATERAL 09/30/2016 7.4  cm/s Final  . LV E/e' medial 09/30/2016 10.49   Final  . LV E/e'average 09/30/2016 10.49   Final  . LA diam index 09/30/2016 2  cm/m2 Final  . LA vol A4C 09/30/2016 51  ml Final  . E decel time 09/30/2016 243  msec Final  . LVOT diameter 09/30/2016 22  mm Final  . LVOT area 09/30/2016 3.80  cm2 Final  . LVOT peak vel 09/30/2016 90.9  cm/s Final  . LVOT SV 09/30/2016 59.00  mL Final  . Peak grad 09/30/2016 2  mmHg Final  . E/e' ratio 09/30/2016 10.49   Final  . MV pk E vel 09/30/2016 77.6  m/s Final  . MV pk A vel 09/30/2016 92.2  m/s Final  . MV Dec 09/30/2016 243   Final  . LA diam end sys 09/30/2016 42.00  mm Final  . TDI e' medial 09/30/2016 4.24   Final  . TDI e' lateral 09/30/2016 7.40   Final    Imaging: No results found.  Speciality Comments: No specialty comments available.    Procedures:  No procedures performed Allergies: Alphagan [brimonidine]; Mobic [meloxicam]; Nsaids; Polyvinyl alcohol;  Statins; Tolmetin; Travatan z [travoprost (bak free)]; and Tape   Assessment / Plan:     Visit Diagnoses:  Gout: She is on allopurinol and has not had  any flares. She's not had any gout flare. She will get CBC and uric acid with her PCP.  Primary osteoarthritis of both knees - not a surgical candidate per Cardiology she continues to have pain and discomfort in her bilateral knee joints. - Plan: XR KNEE 3 VIEW RIGHT, XR KNEE 3 VIEW LEFT, x-rays today showed moderate lateral compartment narrowing no chondrocalcinosis and mild patellofemoral narrowing Ambulatory referral to water  Therapy, Ambulatory referral to Pain Clinic per patient's request as the tramadol is not helping her enough. We will also apply for Euflexxa per patient's request. As she had good results with the previous viscose supplement injections.  Unilateral primary osteoarthritis, right hip - not a surgical candidate per Cardiology - Plan: Ambulatory referral to Pain Clinic  Acute pain of right shoulder: Probably tendinopathy Will refer to physical therapy/water therapy.  Encounter for long-term current use of medication: She is on allopurinol  Other chronic pain - Plan: Ambulatory referral to Pain Clinic  She has multiple medical problems some of which are listed as follows:  Non-ischemic cardiomyopathy ([place]) - Pacemaker and defibrillator, History of aortic dissection, history of DVT  Chronic systolic heart failure ([facility])  Stage 2 chronic kidney disease  Obesity: Weight loss and diet was discussed.  Malignant neoplasm of right female breast, - Lumpectomy September 2010, chemotherapy and radiation therapy  Glaucoma   Anxiety  Renal calcinosis  Dyslipidemia    Orders: Orders Placed This Encounter  Procedures  . XR KNEE 3 VIEW RIGHT  . XR KNEE 3 VIEW LEFT  . Ambulatory referral to Physical Therapy  . Ambulatory referral to Pain Clinic   No orders of the defined types were placed in this  encounter.   Face-to-face time spent with patient was 45 minutes. 50% of time was spent in counseling and coordination of care.  Follow-Up Instructions: No Follow-up on file.   Bo Merino, MD

## 2016-10-03 NOTE — Progress Notes (Signed)
EPIC Encounter for ICM Monitoring  Patient Name: [name] is a 65 y.o. female Date: 10/03/2016 Primary Care Physican: [name], MD Primary Cardiologist:Bensimhon Electrophysiologist: Allred Dry Weight:    235lb  Bi-V Pacing:  86%            Heart Failure questions reviewed, pt asymptomatic   Thoracic impedance normal   Recommendations:  No changes.  Reinforced to limit low salt food choices to 2000 mg day and limiting fluid intake to < 2 liters per day. Encouraged to call for fluid symptoms.   Labs: 09/30/2016 Creatinine 1.21, BUN 17, Potassium 3.7, Sodium 139 01/04/2016 Creatinine 0.94, BUN 18, Potassium 3.7, Sodium 141  Follow-up plan: ICM clinic phone appointment on 11/03/2016.  Copy of ICM check sent to device physician.   ICM trend: 10/03/2016       [name], RN 10/03/2016 4:19 PM

## 2016-10-05 ENCOUNTER — Encounter: Payer: Self-pay | Admitting: Rheumatology

## 2016-10-05 ENCOUNTER — Ambulatory Visit: Payer: Self-pay | Admitting: Rheumatology

## 2016-10-05 ENCOUNTER — Ambulatory Visit (INDEPENDENT_AMBULATORY_CARE_PROVIDER_SITE_OTHER): Payer: PRIVATE HEALTH INSURANCE

## 2016-10-05 ENCOUNTER — Ambulatory Visit (INDEPENDENT_AMBULATORY_CARE_PROVIDER_SITE_OTHER): Payer: PRIVATE HEALTH INSURANCE | Admitting: Rheumatology

## 2016-10-05 VITALS — BP 118/62 | Resp 14 | Ht 62.0 in | Wt 250.0 lb

## 2016-10-05 DIAGNOSIS — M1A09X0 Idiopathic chronic gout, multiple sites, without tophus (tophi): Secondary | ICD-10-CM | POA: Diagnosis not present

## 2016-10-05 DIAGNOSIS — I5022 Chronic systolic (congestive) heart failure: Secondary | ICD-10-CM | POA: Diagnosis not present

## 2016-10-05 DIAGNOSIS — M1611 Unilateral primary osteoarthritis, right hip: Secondary | ICD-10-CM

## 2016-10-05 DIAGNOSIS — I428 Other cardiomyopathies: Secondary | ICD-10-CM | POA: Diagnosis not present

## 2016-10-05 DIAGNOSIS — C50911 Malignant neoplasm of unspecified site of right female breast: Secondary | ICD-10-CM | POA: Diagnosis not present

## 2016-10-05 DIAGNOSIS — M25511 Pain in right shoulder: Secondary | ICD-10-CM

## 2016-10-05 DIAGNOSIS — M170 Bilateral primary osteoarthritis of knee: Secondary | ICD-10-CM

## 2016-10-05 DIAGNOSIS — Z79899 Other long term (current) drug therapy: Secondary | ICD-10-CM

## 2016-10-05 DIAGNOSIS — F419 Anxiety disorder, unspecified: Secondary | ICD-10-CM | POA: Insufficient documentation

## 2016-10-05 DIAGNOSIS — N182 Chronic kidney disease, stage 2 (mild): Secondary | ICD-10-CM | POA: Insufficient documentation

## 2016-10-05 DIAGNOSIS — G8929 Other chronic pain: Secondary | ICD-10-CM

## 2016-10-05 DIAGNOSIS — N29 Other disorders of kidney and ureter in diseases classified elsewhere: Secondary | ICD-10-CM

## 2016-10-05 DIAGNOSIS — H409 Unspecified glaucoma: Secondary | ICD-10-CM | POA: Insufficient documentation

## 2016-10-05 DIAGNOSIS — E785 Hyperlipidemia, unspecified: Secondary | ICD-10-CM | POA: Insufficient documentation

## 2016-11-03 ENCOUNTER — Ambulatory Visit (INDEPENDENT_AMBULATORY_CARE_PROVIDER_SITE_OTHER): Payer: PRIVATE HEALTH INSURANCE | Admitting: *Deleted

## 2016-11-03 DIAGNOSIS — Z95810 Presence of automatic (implantable) cardiac defibrillator: Secondary | ICD-10-CM

## 2016-11-03 DIAGNOSIS — I428 Other cardiomyopathies: Secondary | ICD-10-CM | POA: Diagnosis not present

## 2016-11-03 NOTE — Progress Notes (Signed)
Remote ICD transmission.   

## 2016-11-04 ENCOUNTER — Encounter: Payer: Self-pay | Admitting: Cardiology

## 2016-11-04 NOTE — Progress Notes (Signed)
EPIC Encounter for ICM Monitoring  Patient Name: [name] is a 66 y.o. female Date: 11/04/2016 Primary Care Physican: [name], MD Primary Cardiologist:Bensimhon Electrophysiologist: Allred Dry Weight:238lb  Bi-V Pacing: 87%                                                  Heart Failure questions reviewed, pt asymptomatic   Thoracic impedance normal but was abnormal suggesting fluid accumulation 12/15 to 10/27/2016.  Labs: 09/30/2016 Creatinine 1.21, BUN 17, Potassium 3.7, Sodium 139 01/04/2016 Creatinine 0.94, BUN 18, Potassium 3.7, Sodium 141  Recommendations: No changes.  Reinforced to limit low salt food choices to 2000 mg day and limiting fluid intake to < 2 liters per day. Encouraged to call for fluid symptoms.    Follow-up plan: ICM clinic phone appointment on 12/05/2016.  Copy of ICM check sent to device physician.   3 month ICM trend : 11/04/2016   1 Year ICM trend:      [name], RN 11/04/2016 3:17 PM

## 2016-11-14 ENCOUNTER — Other Ambulatory Visit (HOSPITAL_COMMUNITY): Payer: Self-pay | Admitting: Cardiology

## 2016-11-15 ENCOUNTER — Telehealth: Payer: Self-pay | Admitting: Rheumatology

## 2016-11-15 NOTE — Telephone Encounter (Signed)
Patient is wanting to know any updates on referral for water therapy? Patient said she has been to water therapy before and it is offered in [place].

## 2016-11-21 LAB — CUP PACEART REMOTE DEVICE CHECK
Battery Remaining Longevity: 69 mo
Battery Remaining Percentage: 87 %
Brady Statistic AS VP Percent: 57 %
Brady Statistic AS VS Percent: 3.9 %
HIGH POWER IMPEDANCE MEASURED VALUE: 97 Ohm
HighPow Impedance: 97 Ohm
Implantable Lead Implant Date: 20120914
Implantable Lead Implant Date: 20121116
Implantable Lead Location: 753858
Implantable Lead Model: 511212
Implantable Lead Serial Number: 194059
Lead Channel Impedance Value: 390 Ohm
Lead Channel Pacing Threshold Amplitude: 0.75 V
Lead Channel Sensing Intrinsic Amplitude: 0.8 mV
Lead Channel Setting Pacing Amplitude: 2.125
Lead Channel Setting Pacing Pulse Width: 0.5 ms
MDC IDC LEAD IMPLANT DT: 20120914
MDC IDC LEAD LOCATION: 753859
MDC IDC LEAD LOCATION: 753860
MDC IDC MSMT BATTERY VOLTAGE: 3.04 V
MDC IDC MSMT LEADCHNL LV IMPEDANCE VALUE: 290 Ohm
MDC IDC MSMT LEADCHNL LV PACING THRESHOLD AMPLITUDE: 2.375 V
MDC IDC MSMT LEADCHNL LV PACING THRESHOLD PULSEWIDTH: 0.5 ms
MDC IDC MSMT LEADCHNL RA IMPEDANCE VALUE: 430 Ohm
MDC IDC MSMT LEADCHNL RA PACING THRESHOLD PULSEWIDTH: 0.5 ms
MDC IDC MSMT LEADCHNL RV PACING THRESHOLD AMPLITUDE: 1.125 V
MDC IDC MSMT LEADCHNL RV PACING THRESHOLD PULSEWIDTH: 0.7 ms
MDC IDC MSMT LEADCHNL RV SENSING INTR AMPL: 12 mV
MDC IDC PG IMPLANT DT: 20170314
MDC IDC PG SERIAL: 7328650
MDC IDC SESS DTM: 20180104071848
MDC IDC SET LEADCHNL LV PACING AMPLITUDE: 2.5 V
MDC IDC SET LEADCHNL RA PACING AMPLITUDE: 1.75 V
MDC IDC SET LEADCHNL RV PACING PULSEWIDTH: 0.7 ms
MDC IDC SET LEADCHNL RV SENSING SENSITIVITY: 0.5 mV
MDC IDC STAT BRADY AP VP PERCENT: 30 %
MDC IDC STAT BRADY AP VS PERCENT: 1 %
MDC IDC STAT BRADY RA PERCENT PACED: 24 %

## 2016-12-05 ENCOUNTER — Ambulatory Visit (INDEPENDENT_AMBULATORY_CARE_PROVIDER_SITE_OTHER): Payer: PRIVATE HEALTH INSURANCE

## 2016-12-05 DIAGNOSIS — Z95810 Presence of automatic (implantable) cardiac defibrillator: Secondary | ICD-10-CM | POA: Diagnosis not present

## 2016-12-05 DIAGNOSIS — I5022 Chronic systolic (congestive) heart failure: Secondary | ICD-10-CM | POA: Diagnosis not present

## 2016-12-06 ENCOUNTER — Telehealth: Payer: Self-pay

## 2016-12-06 NOTE — Telephone Encounter (Signed)
Remote ICM transmission received.  Attempted patient call and left message to return call.   

## 2016-12-06 NOTE — Progress Notes (Addendum)
Patient left voice mail to return call.  Call to patient and she stated she has had bronchitis about 3 weeks.  She is finally feeling better after 2 doctor visit and 1 urgent care visit.  She denied any fluid symptoms today.  Advised to call if she has fluid symptoms.

## 2016-12-06 NOTE — Progress Notes (Addendum)
EPIC Encounter for ICM Monitoring  Patient Name: LIONOR HILDE is a 66 y.o. female Date: 12/06/2016 Primary Care Physican: [name], MD Primary Cardiologist:Bensimhon Electrophysiologist: Allred Dry Weight:unknown Bi-V Pacing: 88%      Attempted call to patient and unable to reach.  Left message to return call.  Transmission reviewed.   Thoracic impedance normal but was abnormal suggesting fluid accumulation from 1/24 to 1/27 and 1/30 to 2/1.  Labs: 09/30/2016 Creatinine 1.21, BUN 17, Potassium 3.7, Sodium 139 01/04/2016 Creatinine 0.94, BUN 18, Potassium 3.7, Sodium 141  Recommendations: NONE - Unable to reach patient   Follow-up plan: ICM clinic phone appointment on 01/06/2017.  Copy of ICM check sent to device physician.   3 month ICM trend: 12/05/2016   1 Year ICM trend:      [name], RN 12/06/2016 11:49 AM

## 2017-01-06 ENCOUNTER — Ambulatory Visit (INDEPENDENT_AMBULATORY_CARE_PROVIDER_SITE_OTHER): Payer: PRIVATE HEALTH INSURANCE

## 2017-01-06 ENCOUNTER — Other Ambulatory Visit: Payer: Self-pay | Admitting: Rheumatology

## 2017-01-06 DIAGNOSIS — Z95810 Presence of automatic (implantable) cardiac defibrillator: Secondary | ICD-10-CM | POA: Diagnosis not present

## 2017-01-06 DIAGNOSIS — I5022 Chronic systolic (congestive) heart failure: Secondary | ICD-10-CM | POA: Diagnosis not present

## 2017-01-06 NOTE — Progress Notes (Signed)
EPIC Encounter for ICM Monitoring  Patient Name: [name] is a 66 y.o. female Date: 01/06/2017 Primary Care Physican: [name], MD Primary Cardiologist:Bensimhon Electrophysiologist: Allred Dry Weight:unknown Bi-V Pacing:  89%       Heart Failure questions reviewed, pt symptomatic with in last week and took extra 1/2 tablet of Furosemide x 2 days with relieve of hand and feet swelling.   Thoracic impedance abnormal suggesting fluid accumulation 01/02/2017 and almost at baseline today.  Prescribed and confirmed dosage: Furosemide 40 mg 1 tablet daily  Labs: 09/30/2016 Creatinine 1.21, BUN 17, Potassium 3.7, Sodium 139 01/04/2016 Creatinine 0.94, BUN 18, Potassium 3.7, Sodium 141  Recommendations: No changes. Reminded to limit dietary salt intake to 2000 mg/day and fluid intake to < 2 liters/day. Encouraged to call for fluid symptoms.  Follow-up plan: ICM clinic phone appointment on 02/06/2017.  Copy of ICM check sent to primary cardiologist and device physician.   3 month ICM trend: 01/06/2017   Direct Trend Viewer   1 Year ICM trend:      [name], RN 01/06/2017 10:54 AM

## 2017-01-09 NOTE — Telephone Encounter (Signed)
Advised patient labs due Ok to refill per Dr Estanislado Pandy   Patient states she had labs done with PCP and will have reports sent.

## 2017-01-09 NOTE — Telephone Encounter (Signed)
Last visit 10/05/16 next visit 04/05/17 Ok to refill per Dr Estanislado Pandy

## 2017-01-11 ENCOUNTER — Telehealth: Payer: Self-pay | Admitting: Rheumatology

## 2017-01-11 NOTE — Telephone Encounter (Signed)
Di Kindle from [facility] called to schedule deliver of Euflexxa.  She is wanting to know if tomorrow is okay for delivery.  [phone].  Thank you.

## 2017-01-13 NOTE — Telephone Encounter (Signed)
Brianna from [facility] call again wanting to schedule the delivery of the patient's Euflexxa.  CB#[phone].  Thank you.

## 2017-01-16 NOTE — Telephone Encounter (Signed)
Euflexxa bil to be delivered 01/17/17.

## 2017-02-06 ENCOUNTER — Ambulatory Visit (INDEPENDENT_AMBULATORY_CARE_PROVIDER_SITE_OTHER): Payer: PRIVATE HEALTH INSURANCE | Admitting: Rheumatology

## 2017-02-06 ENCOUNTER — Ambulatory Visit (INDEPENDENT_AMBULATORY_CARE_PROVIDER_SITE_OTHER): Payer: PRIVATE HEALTH INSURANCE | Admitting: *Deleted

## 2017-02-06 DIAGNOSIS — M1711 Unilateral primary osteoarthritis, right knee: Secondary | ICD-10-CM

## 2017-02-06 DIAGNOSIS — I5022 Chronic systolic (congestive) heart failure: Secondary | ICD-10-CM | POA: Diagnosis not present

## 2017-02-06 DIAGNOSIS — M170 Bilateral primary osteoarthritis of knee: Secondary | ICD-10-CM | POA: Diagnosis not present

## 2017-02-06 DIAGNOSIS — Z95810 Presence of automatic (implantable) cardiac defibrillator: Secondary | ICD-10-CM

## 2017-02-06 DIAGNOSIS — M1712 Unilateral primary osteoarthritis, left knee: Secondary | ICD-10-CM | POA: Diagnosis not present

## 2017-02-06 DIAGNOSIS — G8929 Other chronic pain: Secondary | ICD-10-CM | POA: Diagnosis not present

## 2017-02-06 DIAGNOSIS — M25562 Pain in left knee: Secondary | ICD-10-CM | POA: Diagnosis not present

## 2017-02-06 DIAGNOSIS — M25561 Pain in right knee: Secondary | ICD-10-CM

## 2017-02-06 DIAGNOSIS — I428 Other cardiomyopathies: Secondary | ICD-10-CM

## 2017-02-06 MED ORDER — SODIUM HYALURONATE (VISCOSUP) 20 MG/2ML IX SOSY
20.0000 mg | PREFILLED_SYRINGE | INTRA_ARTICULAR | Status: AC | PRN
  Administered 2017-02-06: 20 mg via INTRA_ARTICULAR

## 2017-02-06 MED ORDER — LIDOCAINE HCL 1 % IJ SOLN
1.5000 mL | INTRAMUSCULAR | Status: AC | PRN
  Administered 2017-02-06: 1.5 mL

## 2017-02-06 NOTE — Progress Notes (Signed)
Remote ICD transmission.   

## 2017-02-06 NOTE — Progress Notes (Signed)
EPIC Encounter for ICM Monitoring  Patient Name: [name] is a 66 y.o. female Date: 02/06/2017 Primary Care Physican: [name], MD Primary Cardiologist:Bensimhon Electrophysiologist: Allred Dry Weight:unknown Bi-V Pacing:  89%           Attempted call to patient and unable to reach.  Left message to return call.  Transmission reviewed.    Thoracic impedance abnormal suggesting fluid accumulation but close to baseline today.  Prescribed and confirmed dosage: Furosemide 40 mg 1 tablet daily  Labs: 09/30/2016 Creatinine 1.21, BUN 17, Potassium 3.7, Sodium 139 01/04/2016 Creatinine 0.94, BUN 18, Potassium 3.7, Sodium 141  Recommendations: NONE - Unable to reach patient   Follow-up plan: ICM clinic phone appointment on 03/09/2017.  Office appointment scheduled on 02/20/2017 with Dr Haroldine Laws.  Copy of ICM check sent to primary cardiologist and device physician.   3 month ICM trend: 02/06/2017   1 Year ICM trend:      [name], RN 02/06/2017 12:54 PM

## 2017-02-06 NOTE — Progress Notes (Signed)
   Procedure Note  Patient: [name]             Date of Birth: December 31, 1950           MRN: [id number]             Visit Date: 02/06/2017  Procedures: Visit Diagnoses: No diagnosis found.     Large Joint Inj Date/Time: 02/06/2017 2:50 PM Performed by: Eliezer Lofts Authorized by: Eliezer Lofts   Consent Given by:  Patient Site marked: the procedure site was marked   Timeout: prior to procedure the correct patient, procedure, and site was verified   Indications:  Pain and joint swelling Location:  Knee Site:  R knee Prep: patient was prepped and draped in usual sterile fashion   Needle Size:  27 G Needle Length:  1.5 inches Approach:  Medial Ultrasound Guidance: No   Fluoroscopic Guidance: No   Arthrogram: No   Medications:  1.5 mL lidocaine 1 %; 20 mg Sodium Hyaluronate 20 MG/2ML Aspiration Attempted: Yes   Aspirate amount (mL):  0 Patient tolerance:  Patient tolerated the procedure well with no immediate complications  Euflex and #1 bilateral knees Large Joint Inj Date/Time: 02/06/2017 2:51 PM Performed by: Eliezer Lofts Authorized by: Eliezer Lofts   Consent Given by:  Patient Site marked: the procedure site was marked   Timeout: prior to procedure the correct patient, procedure, and site was verified   Indications:  Pain and joint swelling Location:  Knee Site:  L knee Prep: patient was prepped and draped in usual sterile fashion   Needle Size:  27 G Needle Length:  1.5 inches Approach:  Medial Ultrasound Guidance: No   Fluoroscopic Guidance: No   Arthrogram: No   Medications:  1.5 mL lidocaine 1 %; 20 mg Sodium Hyaluronate 20 MG/2ML Aspiration Attempted: Yes   Patient tolerance:  Patient tolerated the procedure well with no immediate complications  Euflex and #1 bilateral knees

## 2017-02-08 ENCOUNTER — Encounter: Payer: Self-pay | Admitting: Cardiology

## 2017-02-10 LAB — CUP PACEART REMOTE DEVICE CHECK
Battery Remaining Percentage: 83 %
Brady Statistic AP VP Percent: 26 %
Brady Statistic AP VS Percent: 1 %
Brady Statistic AS VP Percent: 63 %
Brady Statistic AS VS Percent: 3.6 %
HIGH POWER IMPEDANCE MEASURED VALUE: 84 Ohm
HIGH POWER IMPEDANCE MEASURED VALUE: 84 Ohm
Implantable Lead Implant Date: 20120914
Implantable Lead Location: 753858
Implantable Lead Location: 753859
Implantable Lead Location: 753860
Implantable Lead Model: 511212
Implantable Lead Serial Number: 194059
Lead Channel Impedance Value: 290 Ohm
Lead Channel Pacing Threshold Amplitude: 1.375 V
Lead Channel Pacing Threshold Amplitude: 2 V
Lead Channel Pacing Threshold Pulse Width: 0.5 ms
Lead Channel Sensing Intrinsic Amplitude: 12 mV
Lead Channel Setting Pacing Amplitude: 2.5 V
Lead Channel Setting Sensing Sensitivity: 0.5 mV
MDC IDC LEAD IMPLANT DT: 20120914
MDC IDC LEAD IMPLANT DT: 20121116
MDC IDC MSMT BATTERY REMAINING LONGEVITY: 66 mo
MDC IDC MSMT BATTERY VOLTAGE: 3.01 V
MDC IDC MSMT LEADCHNL LV PACING THRESHOLD PULSEWIDTH: 0.5 ms
MDC IDC MSMT LEADCHNL RA IMPEDANCE VALUE: 410 Ohm
MDC IDC MSMT LEADCHNL RA PACING THRESHOLD AMPLITUDE: 0.75 V
MDC IDC MSMT LEADCHNL RA SENSING INTR AMPL: 0.8 mV
MDC IDC MSMT LEADCHNL RV IMPEDANCE VALUE: 400 Ohm
MDC IDC MSMT LEADCHNL RV PACING THRESHOLD PULSEWIDTH: 0.7 ms
MDC IDC PG IMPLANT DT: 20170314
MDC IDC PG SERIAL: 7328650
MDC IDC SESS DTM: 20180409060016
MDC IDC SET LEADCHNL LV PACING PULSEWIDTH: 0.5 ms
MDC IDC SET LEADCHNL RA PACING AMPLITUDE: 1.75 V
MDC IDC SET LEADCHNL RV PACING AMPLITUDE: 2.375
MDC IDC SET LEADCHNL RV PACING PULSEWIDTH: 0.7 ms
MDC IDC STAT BRADY RA PERCENT PACED: 21 %

## 2017-02-13 ENCOUNTER — Other Ambulatory Visit: Payer: Self-pay | Admitting: Radiology

## 2017-02-13 ENCOUNTER — Ambulatory Visit (INDEPENDENT_AMBULATORY_CARE_PROVIDER_SITE_OTHER): Payer: PRIVATE HEALTH INSURANCE | Admitting: Rheumatology

## 2017-02-13 DIAGNOSIS — M1711 Unilateral primary osteoarthritis, right knee: Secondary | ICD-10-CM

## 2017-02-13 DIAGNOSIS — M25562 Pain in left knee: Secondary | ICD-10-CM | POA: Diagnosis not present

## 2017-02-13 DIAGNOSIS — M1712 Unilateral primary osteoarthritis, left knee: Secondary | ICD-10-CM

## 2017-02-13 DIAGNOSIS — M170 Bilateral primary osteoarthritis of knee: Secondary | ICD-10-CM

## 2017-02-13 DIAGNOSIS — M25561 Pain in right knee: Secondary | ICD-10-CM

## 2017-02-13 DIAGNOSIS — G8929 Other chronic pain: Secondary | ICD-10-CM | POA: Diagnosis not present

## 2017-02-13 MED ORDER — SODIUM HYALURONATE (VISCOSUP) 20 MG/2ML IX SOSY
20.0000 mg | PREFILLED_SYRINGE | INTRA_ARTICULAR | Status: AC | PRN
  Administered 2017-02-13: 20 mg via INTRA_ARTICULAR

## 2017-02-13 MED ORDER — LIDOCAINE HCL 1 % IJ SOLN
1.5000 mL | INTRAMUSCULAR | Status: AC | PRN
  Administered 2017-02-13: 1.5 mL

## 2017-02-13 NOTE — Progress Notes (Signed)
   Procedure Note  Patient: [name]             Date of Birth: 11-Apr-1951           MRN: [id number]             Visit Date: 02/13/2017  Procedures: Visit Diagnoses: No diagnosis found.  Large Joint Inj Date/Time: 02/13/2017 3:30 PM Performed by: Eliezer Lofts Authorized by: Eliezer Lofts   Consent Given by:  Patient Site marked: the procedure site was marked   Timeout: prior to procedure the correct patient, procedure, and site was verified   Indications:  Pain and joint swelling Location:  Knee Site:  R knee Prep: patient was prepped and draped in usual sterile fashion   Needle Size:  27 G Needle Length:  1.5 inches Approach:  Medial Ultrasound Guidance: No   Fluoroscopic Guidance: No   Arthrogram: No   Medications:  1.5 mL lidocaine 1 %; 20 mg Sodium Hyaluronate 20 MG/2ML Aspiration Attempted: Yes   Aspirate amount (mL):  0 Patient tolerance:  Patient tolerated the procedure well with no immediate complications   Euflexxa No. 2 Patient has seen very good improvement after the first Euflexxa injection. Patient Purchased   Large Joint Inj Date/Time: 02/13/2017 3:35 PM Performed by: Eliezer Lofts Authorized by: Eliezer Lofts   Consent Given by:  Patient Site marked: the procedure site was marked   Timeout: prior to procedure the correct patient, procedure, and site was verified   Indications:  Pain and joint swelling Location:  Knee Site:  L knee Prep: patient was prepped and draped in usual sterile fashion   Needle Size:  27 G Needle Length:  1.5 inches Approach:  Medial Ultrasound Guidance: No   Fluoroscopic Guidance: No   Arthrogram: No   Medications:  1.5 mL lidocaine 1 %; 20 mg Sodium Hyaluronate 20 MG/2ML Aspiration Attempted: Yes   Aspirate amount (mL):  0 Patient tolerance:  Patient tolerated the procedure well with no immediate complications   Euflexxa No. 2 Patient has seen very good improvement after the first Euflexxa  injection. Patient Purchased   Note: Patient states that she has joined "the next 56 day diet". Patient has started taking classes with her husband. There are total of 8 classes. They're learning what foods to canning cannot eat and how to prepare them properly for eventual goal of proper nutrition leading to proper weight loss with proper exercise.   In addition, patient is having right hip pain. I've advised the patient to call and make an appointment with Dr. Ernestina Patches for evaluation and treatment for right hip pain. She may benefit from cortisone in the right hip with fluoroscopy guided injection

## 2017-02-14 ENCOUNTER — Telehealth (INDEPENDENT_AMBULATORY_CARE_PROVIDER_SITE_OTHER): Payer: Self-pay | Admitting: Physical Medicine and Rehabilitation

## 2017-02-14 DIAGNOSIS — M25551 Pain in right hip: Secondary | ICD-10-CM

## 2017-02-14 NOTE — Telephone Encounter (Signed)
Ok to OV with possible SI joint versus hip injection, see if [facility]ala will put in referral for eval/treat hip pain

## 2017-02-15 NOTE — Telephone Encounter (Signed)
Patient scheduled and referral assigned to appointment.

## 2017-02-15 NOTE — Telephone Encounter (Signed)
Right hip injection per Mr Carlyon Shadow, I have put this in for him, thank you for letting me know we missed this.

## 2017-02-20 ENCOUNTER — Ambulatory Visit (HOSPITAL_COMMUNITY)
Admission: RE | Admit: 2017-02-20 | Discharge: 2017-02-20 | Disposition: A | Discharge status: Home / Self Care | Payer: PRIVATE HEALTH INSURANCE | Source: Ambulatory Visit | Attending: Internal Medicine | Admitting: Internal Medicine

## 2017-02-20 ENCOUNTER — Encounter (HOSPITAL_COMMUNITY): Payer: Self-pay | Admitting: Internal Medicine

## 2017-02-20 VITALS — BP 106/70 | HR 108 | Wt 240.8 lb

## 2017-02-20 DIAGNOSIS — I5022 Chronic systolic (congestive) heart failure: Secondary | ICD-10-CM | POA: Diagnosis not present

## 2017-02-20 DIAGNOSIS — R9431 Abnormal electrocardiogram [ECG] [EKG]: Secondary | ICD-10-CM | POA: Diagnosis not present

## 2017-02-20 DIAGNOSIS — I444 Left anterior fascicular block: Secondary | ICD-10-CM | POA: Diagnosis not present

## 2017-02-20 DIAGNOSIS — I428 Other cardiomyopathies: Secondary | ICD-10-CM

## 2017-02-20 DIAGNOSIS — I480 Paroxysmal atrial fibrillation: Secondary | ICD-10-CM

## 2017-02-20 LAB — BASIC METABOLIC PANEL
Anion gap: 13 (ref 5–15)
BUN: 27 mg/dL — ABNORMAL HIGH (ref 6–20)
CALCIUM: 9.5 mg/dL (ref 8.9–10.3)
CO2: 24 mmol/L (ref 22–32)
CREATININE: 1.24 mg/dL — AB (ref 0.44–1.00)
Chloride: 101 mmol/L (ref 101–111)
GFR calc non Af Amer: 44 mL/min — ABNORMAL LOW (ref >60)
GFR, EST AFRICAN AMERICAN: 51 mL/min — AB (ref >60)
Glucose, Bld: 88 mg/dL (ref 65–99)
Potassium: 3.6 mmol/L (ref 3.5–5.1)
Sodium: 138 mmol/L (ref 135–145)

## 2017-02-20 MED ORDER — SACUBITRIL-VALSARTAN 24-26 MG PO TABS: 1.0000 | ORAL_TABLET | Freq: Two times a day (BID) | ORAL | 3 refills | Status: DC

## 2017-02-20 NOTE — Progress Notes (Addendum)
ADVANCED HF CLINIC NOTE   Patient ID: [name], female   DOB: 12/20/1950, 66 y.o.   MRN: [id number] Oncologist: Dr. Philipp Ovens at [facility] EP: Dr. [name] Dr. Bea Graff in [place] HF: Daekwon Beswick  HPI: [name] is a 66 y.o. female a cardiac history that includes HTN, Type B aortic dissection (2010) managed with medical therapy by Dr. Prescott Gum and has healed, LBBB, systolic heart failure due to NICM with EF 10-15% in May 2013 which has decreased from 65% in 2010.  Cath Dec 2011 showed normal arteries.  She was diagnosed with breast cancer in 2009 s/p lumpectomy, XRT, and chemotherapy.  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   She underwent St Jude Bi-V ICD implantation Sept 2012 but unable to place LV lead.  She eventually brought back in Nov 2012 for left thoracotomy and placement of epicardial LV lead placement by Dr. Prescott Gum.  She also had a sleep study in Jan that showed mild OSA.  No clinical intervention occurred.    Recently completed cancer follow up at [facility]. She remains cancer free.   She returns today for regular follow up. Says she feels good. Now on 56 day diet with protein, vegetable and fruits. Drinking 4 bottles of water a day. Remains active with [employer] in [place]. Denies SOB, orthopnea or PND. Has los 8-9 pounds. No edema. Taking lasix 40 daily 1-2x per week takes extra dose in afternoon. Taking all medications. Struggling with hip and knee and pain.    10/10/2012 CPX Peak VO2: 10.6 % predicted peak VO2: 64.4% VE/VCO2 slope: 26.9 OUES: 1.18 Peak RER: 1.29 Ventilatory Threshold: 7.3 % predicted peak VO2: 44.4% Peak RR 23 Peak Ventilation: 29.8 VE/MVV: 32.6% PETCO2 at peak: 43  01/2014 CPX Peak VO2: 10.3 (66.4% predicted peak VO2) VE/VCO2 slope: 25.0 OUES: 1.30 Peak RER: 1.20  CPX (8/16): Peak VO2 10.1 VE/VCO2 slope 28.4 RER 1.14 OUES 1.3 PFTs mildly restrictive No significant  change compared to prior.  Limited more by obesity and restrictive lung physiology than heart (mild circulatory limitation).   ECHO 01/02/13 EF 84% Grade 1 diastolic dysfunction. RV ok. No PAH ECHO 07/10/13 EF 20% RV ok ECHO 10/13/14 EF 15-20% RV ok Echo 10/2015 EF 15%  Echo 09/30/16 LVEF 10-15%, Grade 1 DD, mild PI, Mild TR. Normal RV.  Labs 06/01/13 Creatinine 1.2 Potassium 4.4 Labs 12/14 K 3.9, creatinine 1.2 Labs 01/17/14 K 4.5 Creatinine 1.15  Labs 10/15 K 4.5, creatinine 2.3 Labs 08/18/14 K 4.4 Creatinine 1.24  Labs 08/28/14 Creatinine 1.12  Labs 8/16 K 4, creatinine 1.0, HCT 40.9 Labs 09/21/15  K 3.6 creatinine 1.1 Labs 01/04/2016: K 3.7 Creatinine 0.94   Review of systems complete and found to be negative unless listed in HPI.    Past Medical History:  Diagnosis Date  . Aortic dissection ([place]) 12/2008   Type B  . Arthritis    lower back  . Atrial fibrillation ([place])    post op (left thoracotomy) 11/12 req amio/DCCV  . Breast cancer ([place]) 07/2009   Stage 3 lumpectomy, radiation, chemotherapy; finished chemo October, felt to be in remission  . Cardiac defibrillator in place    St. Jude (JA) 9/12; LV lead placement unsuccessful;  s/p left thoracotomy with placement of epicardial LV lead 09/2011 (Dr. PVT)  . CHF (congestive heart failure) ([place])   . Chronic systolic dysfunction of left ventricle   . Depression   . DVT (deep venous  thrombosis) ([place]) 10/18/2010  . GERD (gastroesophageal reflux disease)   . Glaucoma   . Hiatal hernia    small hiatal hernia  . History of shingles   . Hyperlipidemia   . Hypertension   . Left bundle branch block   . Nonischemic cardiomyopathy ([place])    Admission 12/11 with EF 25%, normal coronary arteries by cath 12/11; NICM presumed to be secondary to chemotherapy for breast cancer  . Obesity   . [name]    with exertion    Current Outpatient Prescriptions  Medication Sig Dispense Refill  . albuterol (PROVENTIL HFA;VENTOLIN HFA)  108 (90 BASE) MCG/ACT inhaler Inhale 2 puffs into the lungs every 4 (four) hours as needed. [name]     . albuterol (PROVENTIL) (2.5 MG/3ML) 0.083% nebulizer solution Take 2.5 mg by nebulization every 4 (four) hours as needed. [name]     . allopurinol (ZYLOPRIM) 300 MG tablet TAKE ONE TABLET BY MOUTH EVERY DAY 90 tablet 0  . ALPRAZolam (XANAX) 1 MG tablet Take 0.5 mg by mouth daily as needed for anxiety. Anxiety     . aspirin 81 MG EC tablet Take 81 mg by mouth daily.      [name] azelastine (OPTIVAR) 0.05 % ophthalmic solution Place 1 drop into both eyes daily.    . Bepotastine Besilate (BEPREVE) 1.5 % SOLN Place 1 drop into both eyes daily as needed. Dry eyes     . bimatoprost (LUMIGAN) 0.03 % ophthalmic solution Place 1 drop into both eyes at bedtime. Reported on 01/12/2016    . Calcium Carbonate-Vitamin D (CALCIUM-VITAMIN D3) 600-200 MG-UNIT TABS Take 1 tablet by mouth 2 (two) times daily.      . carvedilol (COREG) 12.5 MG tablet Take 12.5 mg by mouth 2 (two) times daily with a meal.    . cholecalciferol (VITAMIN D) 1000 UNITS tablet Take 1,000 Units by mouth 2 (two) times daily.      . colchicine 0.6 MG tablet Take 0.6 mg by mouth daily as needed. (GOUT)  0  . [name] 7.5 MG TABS tablet TAKE ONE TABLET TWICE DAILY WITH A MEAL 60 tablet 3  . cyclobenzaprine (FLEXERIL) 10 MG tablet Take 1 tablet (10 mg total) by mouth 3 (three) times daily as needed for muscle spasms. 30 tablet 0  . diclofenac sodium (VOLTAREN) 1 % GEL APPLY THREE GRAMS TO THREE LARGE JOINTS UP TO THREE TIMES DAILY AS NEEDED 500 g 1  . docusate sodium (COLACE) 100 MG capsule Take 100 mg by mouth 2 (two) times daily.    . fexofenadine (ALLEGRA) 180 MG tablet Take 180 mg by mouth daily as needed. Allergies     . fluticasone (FLONASE) 50 MCG/ACT nasal spray Place 2 sprays into the nose daily as needed. Allergies     . furosemide (LASIX) 40 MG tablet Take 1 tablet (40 mg total) by mouth daily. 90 tablet 3  .  letrozole (FEMARA) 2.5 MG tablet Take 2.5 mg by mouth every evening.      . montelukast (SINGULAIR) 10 MG tablet Take 10 mg by mouth at bedtime. For allergies & congestion    . Multiple Vitamin (MULTIVITAMIN) tablet Take 1 tablet by mouth daily.      [name] 20 MG capsule Take 20 mg by mouth 2 (two) times daily.      [name] PARoxetine (PAXIL) 20 MG tablet Take 20 mg by mouth daily.  1  . Polyethyl Glycol-Propyl Glycol (SYSTANE OP) Apply 1 drop  to eye daily as needed (dry eyes).     [name] senna-docusate (SENOKOT-S) 8.6-50 MG per tablet Take 1 tablet by mouth daily as needed for mild constipation.     [name] spironolactone (ALDACTONE) 25 MG tablet TAKE ONE TABLET BY MOUTH EVERY DAY 90 tablet 3  . traMADol (ULTRAM) 50 MG tablet Take 50 mg by mouth every 6 (six) hours as needed for pain.    . valsartan (DIOVAN) 40 MG tablet TAKE ONE TABLET BY MOUTH AT BEDTIME 90 tablet 3   No current facility-administered medications for this encounter.     PHYSICAL EXAM: Vitals:   02/20/17 1424  BP: 106/70  Pulse: (!) 108  SpO2: 97%  Weight: 240 lb 12.8 oz (109.2 kg)    Wt Readings from Last 3 Encounters:  02/20/17 240 lb 12.8 oz (109.2 kg)  10/05/16 250 lb (113.4 kg)  09/30/16 247 lb 4 oz (112.2 kg)    General:  Well appearing. No resp difficulty HEENT: normal Neck: supple. no JVD. Carotids 2+ bilat; no bruits. No lymphadenopathy or thryomegaly appreciated. Cor: PMI nondisplaced. Regular rate & rhythm. No rubs, gallops or murmurs. Lungs: clear Abdomen: obese soft, nontender, nondistended. No hepatosplenomegaly. No bruits or masses. Good bowel sounds. Extremities: no cyanosis, clubbing, rash, edema Neuro: alert & orientedx3, cranial nerves grossly intact. moves all 4 extremities w/o difficulty. Affect pleasant  ASSESSMENT & PLAN: 1. Chronic systolic CHF: Nonischemic cardiomyopathy.  Echo 09/30/16 LVEF 10-15%, Grade 1 DD, mild PI, Mild TR. Normal RV. s/p St Jude Bi-V ICD with epicardial LV  lead. -- CPX in 8/16 was similar to the past: it appeared that the major limitation was from body habitus/deconditioning and not cardiac --Chronic NYHA class II-III symptoms.  - Volume status stable. Continue lasix 40 mg daily.  Can take extra as needed.  - Continue coreg 12.5 mg BID.  - Switch vlasartan 40 to Entresto 24/26 bid - Continue spironolactone 25 daily.  - Continue [name] 7.5 mg BID.  - Reinforced fluid restriction to < 2 L daily, sodium restriction to less than 2000 mg daily, and the importance of daily weights.    2. Paroxysmal atrial fibrillation:  - This was post-op.  NSR on exam.  - Continue ASA 81 mg daily.  - If recurs, will need antiocoag with CHA2DS2/VAS 3 3. Type B aortic dissection:  - Stable.  BP has not been elevated.    [name], PA-C  02/20/2017   Patient seen and examined with the above-signed Advanced Practice Provider and/or Housestaff. I personally reviewed laboratory data, imaging studies and relevant notes. I independently examined the patient and formulated the important aspects of the plan. I have edited the note to reflect any of my changes or salient points. I have personally discussed the plan with the patient and/or family.   She remains stable despite severe nonischemic CM with EF 10-15%. NYHA II-early III. Volume status stable. CPX without high-risk features. Will continue to watch closely. Encouraged her to continue her weight loss efforts to facilitate advanced therapies if needed in the future. (RV ok on echo). Has previously struggled with low BP and only on small dose of valsartan (40 daily) but BP improved today. Will attempt switch to Entresto 24/26 bid. Watch closely for low BP.   ICD interrogated personally in clinic. Impedance ok. No VT/AF.   Glori Bickers, MD  7:14 PM

## 2017-02-20 NOTE — Patient Instructions (Addendum)
Labs today (will call for abnormal results, otherwise no news is good news)  STOP Valsartan  START Entresto 24/26 mg (1 Tablet) Two times Daily  Labs in 1 Month (BMET)   Follow up in 4 months

## 2017-02-22 ENCOUNTER — Telehealth (HOSPITAL_COMMUNITY): Payer: Self-pay | Admitting: Pharmacist

## 2017-02-22 NOTE — Telephone Encounter (Signed)
Entresto 24-26 mg PO BID PA approved by [employer] through 02/21/18.   Ruta Hinds. Velva Harman, PharmD, BCPS, CPP Clinical Pharmacist Pager: [phone] Phone: [phone] 02/22/2017 10:18 AM

## 2017-02-25 NOTE — Addendum Note (Signed)
Encounter addended by: Jolaine Artist, MD on: 02/25/2017  7:21 PM<BR>    Actions taken: Charge Capture section accepted, Sign clinical note

## 2017-02-27 ENCOUNTER — Ambulatory Visit (INDEPENDENT_AMBULATORY_CARE_PROVIDER_SITE_OTHER): Payer: PRIVATE HEALTH INSURANCE | Admitting: Rheumatology

## 2017-02-27 ENCOUNTER — Ambulatory Visit (INDEPENDENT_AMBULATORY_CARE_PROVIDER_SITE_OTHER): Payer: PRIVATE HEALTH INSURANCE | Admitting: Physical Medicine and Rehabilitation

## 2017-02-27 DIAGNOSIS — M25562 Pain in left knee: Secondary | ICD-10-CM

## 2017-02-27 DIAGNOSIS — M170 Bilateral primary osteoarthritis of knee: Secondary | ICD-10-CM

## 2017-02-27 DIAGNOSIS — M25561 Pain in right knee: Secondary | ICD-10-CM

## 2017-02-27 DIAGNOSIS — G8929 Other chronic pain: Secondary | ICD-10-CM

## 2017-02-27 MED ORDER — LIDOCAINE HCL 1 % IJ SOLN
2.0000 mL | INTRAMUSCULAR | Status: AC | PRN
  Administered 2017-02-27: 2 mL

## 2017-02-27 MED ORDER — SODIUM HYALURONATE (VISCOSUP) 20 MG/2ML IX SOSY
20.0000 mg | PREFILLED_SYRINGE | INTRA_ARTICULAR | Status: AC | PRN
  Administered 2017-02-27: 20 mg via INTRA_ARTICULAR

## 2017-02-27 NOTE — Progress Notes (Signed)
   Procedure Note  Patient: [name]             Date of Birth: 1951-07-27           MRN: [id number]             Visit Date: 02/27/2017  Procedures: Visit Diagnoses: No diagnosis found.  Large Joint Inj Date/Time: 02/27/2017 2:50 PM Performed by: Eliezer Lofts Authorized by: Eliezer Lofts   Consent Given by:  Patient Site marked: the procedure site was marked   Timeout: prior to procedure the correct patient, procedure, and site was verified   Indications:  Pain and joint swelling Location:  Knee Site:  R knee Prep: patient was prepped and draped in usual sterile fashion   Needle Size:  27 G Needle Length:  1.5 inches Approach:  Medial Ultrasound Guidance: No   Fluoroscopic Guidance: No   Arthrogram: No   Medications:  2 mL lidocaine 1 %; 20 mg Sodium Hyaluronate 20 MG/2ML Aspiration Attempted: Yes   Patient tolerance:  Patient tolerated the procedure well with no immediate complications   Euflex and #3 bilateral knees This is the patient's Euflexxa Patient feels about 50-60% better after the first 2 injections. Am hopeful that this third and final injection will give her another 25-30% improvement.  Patient is also exercising and observing a more healthy diet. Over the last 2 weeks she's lost about 8 pounds. Am hopeful that she will continue the diet and exercise and have additional improvement in her weight Large Joint Inj Date/Time: 02/27/2017 2:51 PM Performed by: Eliezer Lofts Authorized by: Eliezer Lofts   Consent Given by:  Patient Site marked: the procedure site was marked   Timeout: prior to procedure the correct patient, procedure, and site was verified   Indications:  Pain and joint swelling Location:  Knee Site:  L knee Prep: patient was prepped and draped in usual sterile fashion   Needle Size:  27 G Needle Length:  1.5 inches Approach:  Medial Ultrasound Guidance: No   Fluoroscopic Guidance: No   Arthrogram: No   Medications:  2 mL  lidocaine 1 %; 20 mg Sodium Hyaluronate 20 MG/2ML Aspiration Attempted: Yes   Patient tolerance:  Patient tolerated the procedure well with no immediate complications   Euflex and #3 bilateral knees This is the patient's Euflexxa Patient feels about 50-60% better after the first 2 injections. Am hopeful that this third and final injection will give her another 25-30% improvement.  Patient is also exercising and observing a more healthy diet. Over the last 2 weeks she's lost about 8 pounds. Am hopeful that she will continue the diet and exercise and have additional improvement in her weight

## 2017-03-09 ENCOUNTER — Ambulatory Visit (INDEPENDENT_AMBULATORY_CARE_PROVIDER_SITE_OTHER): Payer: PRIVATE HEALTH INSURANCE

## 2017-03-09 DIAGNOSIS — I5022 Chronic systolic (congestive) heart failure: Secondary | ICD-10-CM

## 2017-03-09 DIAGNOSIS — Z95810 Presence of automatic (implantable) cardiac defibrillator: Secondary | ICD-10-CM | POA: Diagnosis not present

## 2017-03-09 NOTE — Progress Notes (Signed)
EPIC Encounter for ICM Monitoring  Patient Name: [name] is a 66 y.o. female Date: 03/09/2017 Primary Care Physican: [name], MD Primary Cardiologist:Bensimhon Electrophysiologist: Allred Dry Weight:unknown Bi-V Pacing: 90%            Heart Failure questions reviewed, pt asymptomatic.   Thoracic impedance is normal but was abnormal suggesting fluid accumulation 02/25/2017 to 03/06/2017.  Prescribed and confirmed dosage: Furosemide 40 mg 1 tablet daily  Labs: 02/20/2017 Creatinine 1.24, BUN 27, Potassium 3.6, Sodium 138, EGFR 44-51 09/30/2016 Creatinine 1.21, BUN 17, Potassium 3.7, Sodium 139 01/04/2016 Creatinine 0.94, BUN 18, Potassium 3.7, Sodium 141  Recommendations: No changes. Discussed to limit salt intake to 2000 mg/day and fluid intake to < 2 liters/day.  Encouraged to call for fluid symptoms or use local ER for any urgent symptoms.  Follow-up plan: ICM clinic phone appointment on 04/11/2017.  Office appointment scheduled on 03/22/2017 with [facility] and 04/26/2017 with [name], NP for defib office check.  Copy of ICM check sent to device physician.   3 month ICM trend: 03/09/2017   1 Year ICM trend:      [name], RN 03/09/2017 8:12 AM

## 2017-03-13 ENCOUNTER — Other Ambulatory Visit: Payer: Self-pay | Admitting: Rheumatology

## 2017-03-13 NOTE — Telephone Encounter (Signed)
Last Visit: 10/05/16 Next Visit: 08/29/17 Labs: 09/30/16 Creat. 1.21 GFR 53 Previous Creat. 0.94   Okay to refill Allopurinol and Voltaren Gel?

## 2017-03-13 NOTE — Telephone Encounter (Signed)
Ok, labs due in 6/18

## 2017-03-22 ENCOUNTER — Ambulatory Visit (HOSPITAL_COMMUNITY)
Admission: RE | Admit: 2017-03-22 | Discharge: 2017-03-22 | Disposition: A | Discharge status: Home / Self Care | Payer: PRIVATE HEALTH INSURANCE | Source: Ambulatory Visit | Attending: Internal Medicine | Admitting: Internal Medicine

## 2017-03-22 DIAGNOSIS — I5022 Chronic systolic (congestive) heart failure: Secondary | ICD-10-CM | POA: Diagnosis not present

## 2017-03-22 LAB — BASIC METABOLIC PANEL
Anion gap: 10 (ref 5–15)
BUN: 12 mg/dL (ref 6–20)
CALCIUM: 9.4 mg/dL (ref 8.9–10.3)
CO2: 25 mmol/L (ref 22–32)
CREATININE: 1.19 mg/dL — AB (ref 0.44–1.00)
Chloride: 104 mmol/L (ref 101–111)
GFR, EST AFRICAN AMERICAN: 54 mL/min — AB (ref >60)
GFR, EST NON AFRICAN AMERICAN: 47 mL/min — AB (ref >60)
Glucose, Bld: 91 mg/dL (ref 65–99)
Potassium: 3.6 mmol/L (ref 3.5–5.1)
SODIUM: 139 mmol/L (ref 135–145)

## 2017-03-28 ENCOUNTER — Other Ambulatory Visit: Payer: Self-pay | Admitting: Internal Medicine

## 2017-04-05 ENCOUNTER — Ambulatory Visit: Payer: PRIVATE HEALTH INSURANCE | Admitting: Rheumatology

## 2017-04-11 ENCOUNTER — Ambulatory Visit (INDEPENDENT_AMBULATORY_CARE_PROVIDER_SITE_OTHER): Payer: PRIVATE HEALTH INSURANCE

## 2017-04-11 DIAGNOSIS — Z95810 Presence of automatic (implantable) cardiac defibrillator: Secondary | ICD-10-CM

## 2017-04-11 DIAGNOSIS — I5022 Chronic systolic (congestive) heart failure: Secondary | ICD-10-CM | POA: Diagnosis not present

## 2017-04-11 NOTE — Progress Notes (Signed)
EPIC Encounter for ICM Monitoring  Patient Name: [name] is a 66 y.o. female Date: 04/11/2017 Primary Care Physican: [name], MD Primary Cardiologist:Bensimhon Electrophysiologist: Allred Dry Weight:unknown Bi-V Pacing: 90%      Spoke with husband (DPR) due to patient was sleeping. He reported she did not feel good this past weekend but did not give specific symptoms.    Thoracic impedance is at normal today but has been abnormal suggesting fluid accumulation from 03/13/2017 to 03/27/2017, 6/1/201/ to 04/02/2017 and 04/07/2017 to 04/09/2017.  Decreased impedance for total of 37 days since 01/31/2017 (last 70 days).  Prescribed and confirmed dosage: Furosemide 40 mg 1 tablet daily.  Per last office visit note, 02/20/2017 with Dr Haroldine Laws she can take extra Lasix when needed and aware she takes extra a couple times a week.    Labs: 03/22/2017 Creatinine 1.19, BUN 12, Potassium 3.6, Sodium 139, EGFR 47-54 02/20/2017 Creatinine 1.24, BUN 27, Potassium 3.6, Sodium 138, EGFR 44-51 09/30/2016 Creatinine 1.21, BUN 17, Potassium 3.7, Sodium 139 01/04/2016 Creatinine 0.94, BUN 18, Potassium 3.7, Sodium 141  Recommendations:  Advised to limit salt intake to 2000 mg/day and fluid intake to < 2 liters/day. Explained she has had an increase in the number of days and length of time she is accumulating fluid.   Encouraged to call for fluid symptoms.  Follow-up plan: ICM clinic phone appointment on 05/30/2017.  Office appointment scheduled on 04/26/2017 with [name], NP.  Copy of ICM check sent to primary cardiologist and device physician.   3 month ICM trend: 04/11/2017   1 Year ICM trend:      [name], RN 04/11/2017 8:27 AM

## 2017-04-14 NOTE — Progress Notes (Signed)
Returned patient call as requested by voice mail message.  Attempted call and no answer.  Left voice mail message with number.

## 2017-04-23 NOTE — Progress Notes (Signed)
Electrophysiology Office Note Date: 04/26/2017  ID:  [name], DOB April 28, 1951, MRN [id number]  PCP: Brittney Mina., MD Primary Cardiologist: [name]/[name] Electrophysiologist: [name]  CC: CHF follow up  [name] is a 66 y.o. female seen today for [name].  She presents today for routine electrophysiology followup.  Since last being seen in our Davis, the patient reports doing very well. She denies chest pain, palpitations, PND, orthopnea, nausea, vomiting, dizziness, syncope, edema, weight gain, or early satiety.  She has not had ICD shocks. She is on the 56 day diet plan and has lost 20 pounds in the last 3 months.  She has had less knee and hip pain with weight loss. 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  Device History: STJ CRTD implanted 2012 for NICM, LBBB, CHF (epicardial LV lead placed by [facility] 2012) History of appropriate therapy: No History of AAD therapy: No   Past Medical History:  Diagnosis Date  . 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 Arthritis    lower back  . Atrial fibrillation ([place])    post op (left thoracotomy) 11/12 req amio/DCCV  . Breast cancer ([place]) 07/2009   Stage 3 lumpectomy, radiation, chemotherapy; finished chemo October, felt to be in remission  . Cardiac defibrillator in place    St. Jude (JA) 9/12; LV lead placement unsuccessful;  s/p left thoracotomy with placement of epicardial LV lead 09/2011 (Brittney. PVT)  . CHF (congestive heart failure) ([place])   . Chronic systolic dysfunction of left ventricle   . Depression   . DVT (deep venous thrombosis) ([place]) 10/18/2010  . GERD (gastroesophageal reflux disease)   . Glaucoma   . Hiatal hernia    small hiatal hernia  . History of shingles   . Hyperlipidemia   . Hypertension   . Left bundle branch block   . Nonischemic cardiomyopathy ([place])    Admission 12/11 with EF 25%, normal coronary arteries by cath 12/11; NICM presumed to be secondary to chemotherapy for breast  cancer  . Obesity   . [name]    with exertion   Past Surgical History:  Procedure Laterality Date  . BREAST LUMPECTOMY     Right breast  . CARDIAC CATHETERIZATION     2011  . CARDIAC DEFIBRILLATOR PLACEMENT    . EP IMPLANTABLE DEVICE N/A 01/12/2016   STJ ICD Unify Assura gen change, Brittney. Rayann Davis  . OOPHORECTOMY     Single  . THORACOTOMY  09/16/2011   Procedure: THORACOTOMY MAJOR;  Surgeon: Tharon Aquas Adelene Idler, MD;  Location[facility];  Service: Thoracic;  Laterality: Left;  left anterolateral Thoracotomy for placement of St. Jude epicardial pacing lead   . TONSILLECTOMY    . TUBAL LIGATION     Bilateral    Current Outpatient Prescriptions  Medication Sig Dispense Refill  . albuterol (PROVENTIL HFA;VENTOLIN HFA) 108 (90 BASE) MCG/ACT inhaler Inhale 2 puffs into the lungs every 4 (four) hours as needed. [name]     . albuterol (PROVENTIL) (2.5 MG/3ML) 0.083% nebulizer solution Take 2.5 mg by nebulization every 4 (four) hours as needed. [name]     . allopurinol (ZYLOPRIM) 300 MG tablet TAKE ONE TABLET BY MOUTH EVERY DAY 90 tablet 0  . ALPRAZolam (XANAX) 1 MG tablet Take 0.5 mg by mouth daily as needed for anxiety. Anxiety     . aspirin 81 MG EC tablet Take 81 mg by mouth daily.      [name]  azelastine (OPTIVAR) 0.05 % ophthalmic solution Place 1 drop into both eyes daily.    . Bepotastine Besilate (BEPREVE) 1.5 % SOLN Place 1 drop into both eyes daily as needed. Dry eyes     . bimatoprost (LUMIGAN) 0.03 % ophthalmic solution Place 1 drop into both eyes at bedtime. Reported on 01/12/2016    . Calcium Carbonate-Vitamin D (CALCIUM-VITAMIN D3) 600-200 MG-UNIT TABS Take 1 tablet by mouth 2 (two) times daily.      . carvedilol (COREG) 12.5 MG tablet Take 12.5 mg by mouth 2 (two) times daily with a meal.    . cholecalciferol (VITAMIN D) 1000 UNITS tablet Take 1,000 Units by mouth 2 (two) times daily.      . colchicine 0.6 MG tablet Take 0.6 mg by mouth daily as  needed. (GOUT)  0  . [name] 7.5 MG TABS tablet TAKE ONE TABLET TWICE DAILY WITH A MEAL 60 tablet 3  . cyclobenzaprine (FLEXERIL) 10 MG tablet Take 1 tablet (10 mg total) by mouth 3 (three) times daily as needed for muscle spasms. 30 tablet 0  . diclofenac sodium (VOLTAREN) 1 % GEL APPLY THREE GRAMS TO THREE LARGE JOINTS UP TO THREE TIMES DAILY AS NEEDED 500 g 1  . docusate sodium (COLACE) 100 MG capsule Take 100 mg by mouth 2 (two) times daily.    . fexofenadine (ALLEGRA) 180 MG tablet Take 180 mg by mouth daily as needed. Allergies     . fluticasone (FLONASE) 50 MCG/ACT nasal spray Place 2 sprays into the nose daily as needed. Allergies     . furosemide (LASIX) 40 MG tablet Take 1 tablet (40 mg total) by mouth daily. 90 tablet 3  . letrozole (FEMARA) 2.5 MG tablet Take 2.5 mg by mouth every evening.      . montelukast (SINGULAIR) 10 MG tablet Take 10 mg by mouth at bedtime. For allergies & congestion    . Multiple Vitamin (MULTIVITAMIN) tablet Take 1 tablet by mouth daily.      [name] 20 MG capsule Take 20 mg by mouth 2 (two) times daily.      [name] PARoxetine (PAXIL) 20 MG tablet Take 20 mg by mouth daily.  1  . Polyethyl Glycol-Propyl Glycol (SYSTANE OP) Apply 1 drop to eye daily as needed (dry eyes).     . sacubitril-valsartan (ENTRESTO) 24-26 MG Take 1 tablet by mouth 2 (two) times daily. 60 tablet 3  . senna-docusate (SENOKOT-S) 8.6-50 MG per tablet Take 1 tablet by mouth daily as needed for mild constipation.     [name] spironolactone (ALDACTONE) 25 MG tablet TAKE ONE TABLET BY MOUTH EVERY DAY 90 tablet 3  . traMADol (ULTRAM) 50 MG tablet Take 50 mg by mouth every 6 (six) hours as needed for pain.     No current facility-administered medications for this visit.     Allergies:   Alphagan [brimonidine]; Mobic [meloxicam]; Nsaids; Polyvinyl alcohol; Statins; Tolmetin; Travatan z [travoprost (bak free)]; and Tape   Social History: Social History   Social History  . Marital  status: Married    Spouse name: N/A  . Number of children: N/A  . Years of education: N/A   Occupational History  . [employer]  . Retired from Science writer    Social History Main Topics  . Smoking status: Never Smoker  . Smokeless tobacco: Never Used  . Alcohol use No  . Drug use: No  . Sexual activity: Not on file   Other Topics Concern  .  Not on file   Social History Narrative   Married   One child   Lives in [place] with spouse and mother in law   Previously worked as a Art gallery manager for [employer]      She is a Leisure centre manager counsel member.    Family History: Family History  Problem Relation Age of Onset  . Hypertension Mother   . Hypertension Father   . Hypertension Brother   . Hypertension Maternal Grandfather   . Heart attack Maternal Grandfather        MI  . Coronary artery disease Maternal Grandfather   . Hypertension Paternal Grandfather   . Heart attack Paternal Grandfather        MI  . Coronary artery disease Paternal Grandfather   . Hypertension Brother     Review of Systems: All other systems reviewed and are otherwise negative except as noted above.   Physical Exam: VS:  BP 118/72   Pulse 91   Ht 5\' 2"  (1.575 m)   Wt 232 lb (105.2 kg)   SpO2 96%   BMI 42.43 kg/m  , BMI Body mass index is 42.43 kg/m.  GEN- The patient is obese appearing, alert and oriented x 3 today.   HEENT: normocephalic, atraumatic; sclera clear, conjunctiva pink; hearing intact; oropharynx clear; neck supple  Lungs- Clear to ausculation bilaterally, normal work of breathing.  No wheezes, rales, rhonchi Heart- Regular rate and rhythm (paced) GI- soft, non-tender, non-distended, bowel sounds present  Extremities- no clubbing, cyanosis, no edema MS- no significant deformity or atrophy Skin- warm and dry, no rash or lesion; ICD pocket well healed Psych- euthymic mood, full affect Neuro- strength and sensation are intact  ICD interrogation- reviewed in  detail today,  See PACEART report  EKG:  EKG is not ordered today.  Recent Labs: 03/22/2017: BUN 12; Creatinine, Ser 1.19; Potassium 3.6; Sodium 139   Wt Readings from Last 3 Encounters:  04/26/17 232 lb (105.2 kg)  02/20/17 240 lb 12.8 oz (109.2 kg)  10/05/16 250 lb (113.4 kg)     Other studies Reviewed: Additional studies/ records that were reviewed today include: Brittney notes, Brittney Jackalyn Lombard last office notes  Assessment and Plan:  1.  Chronic systolic dysfunction/NICM euvolemic today Stable on an appropriate medical regimen Normal ICD function  See Pace Art report No changes today  2.  Paroxysmal atrial fibrillation Burden by device interrogation <1%, all episodes <5 minutes.  If she had documented sustained episodes of AF, will need to consider initiation of anticoagulation CHADS2VASC is at least 3. Discussed with patient today.   3. Obesity Continued weight loss encouraged today Body mass index is 42.43 kg/m.  4.  HTN Stable No change required today  5.  Type B aortic dissection BP well controlled Stable    Current medicines are reviewed at length with the patient today.   The patient does not have concerns regarding her medicines.  The following changes were made today:  none  Labs/ tests ordered today include: none  Disposition:   Follow up with Merlin transmissions, ICM Davis, [name] in 1 year, Brittney as scheduled    Signed, [name], NP 04/26/2017 10:34 AM  [address] [place] [place]  [phone] (office) [phone] (fax

## 2017-04-26 ENCOUNTER — Encounter: Payer: Self-pay | Admitting: Nurse Practitioner

## 2017-04-26 ENCOUNTER — Ambulatory Visit (INDEPENDENT_AMBULATORY_CARE_PROVIDER_SITE_OTHER): Payer: PRIVATE HEALTH INSURANCE | Admitting: Nurse Practitioner

## 2017-04-26 VITALS — BP 118/72 | HR 91 | Ht 62.0 in | Wt 232.0 lb

## 2017-04-26 DIAGNOSIS — I10 Essential (primary) hypertension: Secondary | ICD-10-CM | POA: Diagnosis not present

## 2017-04-26 DIAGNOSIS — I71019 Dissection of thoracic aorta, unspecified: Secondary | ICD-10-CM

## 2017-04-26 DIAGNOSIS — I5022 Chronic systolic (congestive) heart failure: Secondary | ICD-10-CM

## 2017-04-26 DIAGNOSIS — I7101 Dissection of thoracic aorta: Secondary | ICD-10-CM | POA: Diagnosis not present

## 2017-04-26 DIAGNOSIS — I480 Paroxysmal atrial fibrillation: Secondary | ICD-10-CM

## 2017-04-26 LAB — CUP PACEART INCLINIC DEVICE CHECK
Date Time Interrogation Session: 20180627103345
Implantable Lead Implant Date: 20120914
Implantable Lead Location: 753858
Implantable Lead Location: 753859
Implantable Lead Serial Number: 194059
Implantable Pulse Generator Implant Date: 20170314
MDC IDC LEAD IMPLANT DT: 20120914
MDC IDC LEAD IMPLANT DT: 20121116
MDC IDC LEAD LOCATION: 753860
Pulse Gen Serial Number: 7328650

## 2017-04-26 NOTE — Patient Instructions (Signed)
Medication Instructions:  Your physician recommends that you continue on your current medications as directed. Please refer to the Current Medication list given to you today.   Labwork: None Ordered   Testing/Procedures: None Ordered   Follow-Up: Your physician wants you to follow-up in: 1 year with Dr. Vallery Ridge will receive a reminder letter in the mail two months in advance. If you don't receive a letter, please call our office to schedule the follow-up appointment.   Remote monitoring is used to monitor your Pacemaker of ICD from home. This monitoring reduces the number of office visits required to check your device to one time per year. It allows [place] to keep an eye on the functioning of your device to ensure it is working properly. You are scheduled for a device check from home on 07/26/17. You may send your transmission at any time that day. If you have a wireless device, the transmission will be sent automatically. After your physician reviews your transmission, you will receive a postcard with your next transmission date.    Any Other Special Instructions Will Be Listed Below (If Applicable).     If you need a refill on your cardiac medications before your next appointment, please call your pharmacy.

## 2017-05-13 ENCOUNTER — Other Ambulatory Visit: Payer: Self-pay | Admitting: Rheumatology

## 2017-05-15 ENCOUNTER — Ambulatory Visit (INDEPENDENT_AMBULATORY_CARE_PROVIDER_SITE_OTHER): Payer: PRIVATE HEALTH INSURANCE | Admitting: *Deleted

## 2017-05-15 DIAGNOSIS — I428 Other cardiomyopathies: Secondary | ICD-10-CM | POA: Diagnosis not present

## 2017-05-15 NOTE — Telephone Encounter (Signed)
Last Visit: 10/05/16 Next Visit: 08/29/17  Okay to refill per Dr. Estanislado Pandy

## 2017-05-15 NOTE — Progress Notes (Signed)
Remote ICD transmission.   

## 2017-05-17 ENCOUNTER — Encounter: Payer: Self-pay | Admitting: Cardiology

## 2017-05-17 LAB — CUP PACEART REMOTE DEVICE CHECK
Battery Remaining Longevity: 65 mo
Battery Remaining Percentage: 80 %
Battery Voltage: 2.99 V
Brady Statistic AP VP Percent: 9.6 %
Brady Statistic RA Percent Paced: 9 %
Date Time Interrogation Session: 20180716060015
HIGH POWER IMPEDANCE MEASURED VALUE: 89 Ohm
HIGH POWER IMPEDANCE MEASURED VALUE: 89 Ohm
Implantable Lead Implant Date: 20120914
Implantable Lead Implant Date: 20120914
Implantable Lead Location: 753858
Implantable Lead Location: 753859
Implantable Lead Location: 753860
Implantable Lead Model: 511212
Implantable Lead Serial Number: 194059
Implantable Pulse Generator Implant Date: 20170314
Lead Channel Impedance Value: 290 Ohm
Lead Channel Impedance Value: 450 Ohm
Lead Channel Pacing Threshold Amplitude: 1 V
Lead Channel Pacing Threshold Amplitude: 2.125 V
Lead Channel Pacing Threshold Pulse Width: 0.5 ms
Lead Channel Pacing Threshold Pulse Width: 0.7 ms
Lead Channel Sensing Intrinsic Amplitude: 11.7 mV
Lead Channel Setting Sensing Sensitivity: 0.5 mV
MDC IDC LEAD IMPLANT DT: 20121116
MDC IDC MSMT LEADCHNL RA IMPEDANCE VALUE: 430 Ohm
MDC IDC MSMT LEADCHNL RA PACING THRESHOLD AMPLITUDE: 0.75 V
MDC IDC MSMT LEADCHNL RA PACING THRESHOLD PULSEWIDTH: 0.5 ms
MDC IDC MSMT LEADCHNL RA SENSING INTR AMPL: 0.5 mV
MDC IDC SET LEADCHNL LV PACING AMPLITUDE: 2.5 V
MDC IDC SET LEADCHNL LV PACING PULSEWIDTH: 0.5 ms
MDC IDC SET LEADCHNL RA PACING AMPLITUDE: 1.75 V
MDC IDC SET LEADCHNL RV PACING AMPLITUDE: 2 V
MDC IDC SET LEADCHNL RV PACING PULSEWIDTH: 0.7 ms
MDC IDC STAT BRADY AP VS PERCENT: 1 %
MDC IDC STAT BRADY AS VP PERCENT: 87 %
MDC IDC STAT BRADY AS VS PERCENT: 2.3 %
Pulse Gen Serial Number: 7328650

## 2017-05-30 ENCOUNTER — Ambulatory Visit (INDEPENDENT_AMBULATORY_CARE_PROVIDER_SITE_OTHER): Payer: PRIVATE HEALTH INSURANCE

## 2017-05-30 DIAGNOSIS — I5022 Chronic systolic (congestive) heart failure: Secondary | ICD-10-CM

## 2017-05-30 DIAGNOSIS — Z95810 Presence of automatic (implantable) cardiac defibrillator: Secondary | ICD-10-CM | POA: Diagnosis not present

## 2017-05-30 NOTE — Progress Notes (Signed)
EPIC Encounter for ICM Monitoring  Patient Name: GLENDER AUGUSTA is a 66 y.o. female Date: 05/30/2017 Primary Care Physican: [name], MD Primary Cardiologist:Bensimhon Electrophysiologist: Allred Dry Weight:unknown Bi-V Pacing: 95%                 Spoke with husband due to patient was sleeping. Heart Failure questions reviewed, pt feeling tired and swelling of ankles   Thoracic impedance abnormal suggesting fluid accumulation since 05/27/2017 but starting to trend back toward baseline.  Prescribed dosage: Furosemide 40 mg 1 tablet daily.  Per last office visit note, 02/20/2017 with Dr Haroldine Laws she can take extra Lasix when needed and aware she takes extra a couple times a week.    Labs: 03/22/2017 Creatinine 1.19, BUN 12, Potassium 3.6, Sodium 139, EGFR 47-54 02/20/2017 Creatinine 1.24, BUN 27, Potassium 3.6, Sodium 138, EGFR 44-51 09/30/2016 Creatinine 1.21, BUN 17, Potassium 3.7, Sodium 139 01/04/2016 Creatinine 0.94, BUN 18, Potassium 3.7, Sodium 141  Recommendations:  Advised to take Furosemide 40 mg 1 tablet twice a day x 2 days and then return to 1 tablet daily.  He verbalized understanding.   Follow-up plan: ICM clinic phone appointment on 06/08/2017 to recheck fluid levels.   Copy of ICM check sent to primary cardiologist and device physician.   3 month ICM trend: 05/30/2017   1 Year ICM trend:      [name], RN 05/30/2017 1:27 PM

## 2017-06-08 ENCOUNTER — Ambulatory Visit (INDEPENDENT_AMBULATORY_CARE_PROVIDER_SITE_OTHER): Payer: Self-pay

## 2017-06-08 DIAGNOSIS — Z95810 Presence of automatic (implantable) cardiac defibrillator: Secondary | ICD-10-CM

## 2017-06-08 DIAGNOSIS — I5022 Chronic systolic (congestive) heart failure: Secondary | ICD-10-CM

## 2017-06-08 NOTE — Progress Notes (Signed)
EPIC Encounter for ICM Monitoring  Patient Name: [name] is a 66 y.o. female Date: 06/08/2017 Primary Care Physican: [name], MD Primary Cardiologist:Bensimhon Electrophysiologist: Allred Dry Weight:unknown Bi-V Pacing: 95%      Heart Failure questions reviewed, pt asymptomatic.   Thoracic impedance returned to normal after taking 2 days of extra Furosemide.   Prescribed dosage: Furosemide 40 mg 1 tablet daily. Per last office visit note, 02/20/2017 with Dr Haroldine Laws she can take extra Lasix when needed and aware she takes extra a couple times a week.   Labs: 03/22/2017 Creatinine 1.19, BUN 12, Potassium 3.6, Sodium 139, EGFR 47-54 02/20/2017 Creatinine 1.24, BUN 27, Potassium 3.6, Sodium 138, EGFR 44-51 09/30/2016 Creatinine 1.21, BUN 17, Potassium 3.7, Sodium 139 01/04/2016 Creatinine 0.94, BUN 18, Potassium 3.7, Sodium 141  Recommendations: No changes.   Encouraged to call for fluid symptoms.  Follow-up plan: ICM clinic phone appointment on 06/30/2017.    Copy of ICM check sent to device physician.   3 month ICM trend: 06/08/2017   1 Year ICM trend:      [name], RN 06/08/2017 9:21 AM

## 2017-06-14 ENCOUNTER — Other Ambulatory Visit: Payer: Self-pay | Admitting: Rheumatology

## 2017-06-14 ENCOUNTER — Other Ambulatory Visit (HOSPITAL_COMMUNITY): Payer: Self-pay | Admitting: Internal Medicine

## 2017-06-14 ENCOUNTER — Other Ambulatory Visit (HOSPITAL_COMMUNITY): Payer: Self-pay | Admitting: Cardiology

## 2017-06-15 NOTE — Telephone Encounter (Addendum)
02/27/17 last visit  Next 08/29/17  CMP Latest Ref Rng & Units 03/22/2017 02/20/2017 09/30/2016  Glucose 65 - 99 mg/dL 91 88 84  BUN 6 - 20 mg/dL 12 27(H) 17  Creatinine 0.44 - 1.00 mg/dL 1.19(H) 1.24(H) 1.21(H)  Sodium 135 - 145 mmol/L 139 138 139  Potassium 3.5 - 5.1 mmol/L 3.6 3.6 3.7  Chloride 101 - 111 mmol/L 104 101 102  CO2 22 - 32 mmol/L 25 24 29   Calcium 8.9 - 10.3 mg/dL 9.4 9.5 9.4  Total Protein 6.0 - 8.3 g/dL - - -  Total Bilirubin 0.3 - 1.2 mg/dL - - -  Alkaline Phos 39 - 117 U/L - - -  AST 0 - 37 U/L - - -  ALT 0 - 35 U/L - - -    Ok to refill per Dr Estanislado Pandy

## 2017-06-19 ENCOUNTER — Other Ambulatory Visit (HOSPITAL_COMMUNITY): Payer: Self-pay | Admitting: Cardiology

## 2017-06-30 ENCOUNTER — Telehealth: Payer: Self-pay

## 2017-06-30 ENCOUNTER — Ambulatory Visit (INDEPENDENT_AMBULATORY_CARE_PROVIDER_SITE_OTHER): Payer: PRIVATE HEALTH INSURANCE

## 2017-06-30 DIAGNOSIS — I5022 Chronic systolic (congestive) heart failure: Secondary | ICD-10-CM | POA: Diagnosis not present

## 2017-06-30 DIAGNOSIS — Z95810 Presence of automatic (implantable) cardiac defibrillator: Secondary | ICD-10-CM | POA: Diagnosis not present

## 2017-06-30 NOTE — Progress Notes (Signed)
EPIC Encounter for ICM Monitoring  Patient Name: [name] is a 66 y.o. female Date: 06/30/2017 Primary Care Physican: [name], MD Primary Cardiologist:Bensimhon Electrophysiologist: Allred Dry Weight:unknown Bi-V Pacing: 96%       Attempted call to patient and unable to reach.  Transmission reviewed.    Thoracic impedance normal.  Prescribed dosage: Furosemide 40 mg 1 tablet daily. Per office visit note, 02/20/2017 with Dr Haroldine Laws she can take extra Lasix when needed and aware she takes extra a couple times a week.   Labs: 03/22/2017 Creatinine 1.19, BUN 12, Potassium 3.6, Sodium 139, EGFR 47-54 02/20/2017 Creatinine 1.24, BUN 27, Potassium 3.6, Sodium 138, EGFR 44-51 09/30/2016 Creatinine 1.21, BUN 17, Potassium 3.7, Sodium 139 01/04/2016 Creatinine 0.94, BUN 18, Potassium 3.7, Sodium 141  Recommendations: .NONE - Unable to reach patient   Follow-up plan: ICM clinic phone appointment on 08/14/2017.    Copy of ICM check sent to Dr. [name].   3 month ICM trend: 06/30/2017   1 Year ICM trend:      [name], RN 06/30/2017 7:56 AM

## 2017-06-30 NOTE — Telephone Encounter (Signed)
Remote ICM transmission received.  Attempted call to patient and no answer or answering machine.  

## 2017-07-24 ENCOUNTER — Ambulatory Visit (INDEPENDENT_AMBULATORY_CARE_PROVIDER_SITE_OTHER): Payer: PRIVATE HEALTH INSURANCE

## 2017-07-24 DIAGNOSIS — I5022 Chronic systolic (congestive) heart failure: Secondary | ICD-10-CM

## 2017-07-24 DIAGNOSIS — Z95810 Presence of automatic (implantable) cardiac defibrillator: Secondary | ICD-10-CM

## 2017-07-24 NOTE — Progress Notes (Signed)
EPIC Encounter for ICM Monitoring  Patient Name: [name] is a 66 y.o. female Date: 07/24/2017 Primary Care Physican: [name], MD Primary Cardiologist:Bensimhon Electrophysiologist: Allred Dry Weight:unknown Bi-V Pacing: 95%         Attempted call to patient and unable to reach.  Left detailed message regarding transmission.  Transmission reviewed.    Thoracic impedance normal but was abnormal suggesting fluid accumulation from 9/1 through 9/7.  Prescribed dosage: Furosemide 40 mg 1 tablet daily. Per office visit note, 02/20/2017 with Dr Haroldine Laws she can take extra Lasix when needed and aware she takes extra a couple times a week.   Labs: 03/22/2017 Creatinine 1.19, BUN 12, Potassium 3.6, Sodium 139, EGFR 47-54 02/20/2017 Creatinine 1.24, BUN 27, Potassium 3.6, Sodium 138, EGFR 44-51 09/30/2016 Creatinine 1.21, BUN 17, Potassium 3.7, Sodium 139 01/04/2016 Creatinine 0.94, BUN 18, Potassium 3.7, Sodium 141  Recommendations: Left voice mail with ICM number and encouraged to call for fluid symptoms.  Follow-up plan: ICM clinic phone appointment on 08/14/2017.    Office appointment scheduled 08/21/2017 with Dr. Haroldine Laws.  Copy of ICM check sent to Dr. [name].   3 month ICM trend: 07/21/2017   1 Year ICM trend:      [name], RN 07/24/2017 8:49 AM

## 2017-08-14 ENCOUNTER — Telehealth: Payer: Self-pay

## 2017-08-14 ENCOUNTER — Ambulatory Visit (INDEPENDENT_AMBULATORY_CARE_PROVIDER_SITE_OTHER): Payer: PRIVATE HEALTH INSURANCE | Admitting: *Deleted

## 2017-08-14 DIAGNOSIS — Z95810 Presence of automatic (implantable) cardiac defibrillator: Secondary | ICD-10-CM

## 2017-08-14 DIAGNOSIS — I5022 Chronic systolic (congestive) heart failure: Secondary | ICD-10-CM | POA: Diagnosis not present

## 2017-08-14 DIAGNOSIS — I428 Other cardiomyopathies: Secondary | ICD-10-CM

## 2017-08-14 NOTE — Progress Notes (Signed)
Remote ICD transmission.   

## 2017-08-14 NOTE — Progress Notes (Signed)
EPIC Encounter for ICM Monitoring  Patient Name: [name] is a 66 y.o. female Date: 08/14/2017 Primary Care Physican: [name], MD Primary Cardiologist:Bensimhon Electrophysiologist: Allred Dry Weight:unknown Bi-V Pacing: 95%        Attempted call to patient and unable to reach.  Transmission reviewed.    Thoracic impedance normal but was abnormal suggesting fluid accumulation from 07/24/2017 to 07/30/2017.  Prescribed dosage: Furosemide 40 mg 1 tablet daily. Per office visit note, 02/20/2017 with Dr Haroldine Laws she can take extra Lasix when needed and aware she takes extra a couple times a week.   Labs: 03/22/2017 Creatinine 1.19, BUN 12, Potassium 3.6, Sodium 139, EGFR 47-54 02/20/2017 Creatinine 1.24, BUN 27, Potassium 3.6, Sodium 138, EGFR 44-51 09/30/2016 Creatinine 1.21, BUN 17, Potassium 3.7, Sodium 139 01/04/2016 Creatinine 0.94, BUN 18, Potassium 3.7, Sodium 141  Recommendations: NONE - Unable to reach patient   Follow-up plan: ICM clinic phone appointment on 09/14/2017.  Office appointment scheduled 08/21/2017 with Dr. Haroldine Laws.  Copy of ICM check sent to Dr. [name].   3 month ICM trend: 08/14/2017   1 Year ICM trend:      [name], RN 08/14/2017 3:13 PM

## 2017-08-14 NOTE — Telephone Encounter (Signed)
Remote ICM transmission received.  Attempted call to patient and mail box is full. 

## 2017-08-16 NOTE — Progress Notes (Deleted)
Office Visit Note  Patient: [name]             Date of Birth: 03/18/1951           MRN: [id number]             PCP: [name], MD Referring: [name], MD Visit Date: 08/29/2017 Occupation: @GUAROCC @    Subjective:  No chief complaint on file.   History of Present Illness: [name] is a 66 y.o. female ***   Activities of Daily Living:  Patient reports morning stiffness for *** {minute/hour:19697}.   Patient {ACTIONS;DENIES/REPORTS:21021675::"Denies"} nocturnal pain.  Difficulty dressing/grooming: {ACTIONS;DENIES/REPORTS:21021675::"Denies"} Difficulty climbing stairs: {ACTIONS;DENIES/REPORTS:21021675::"Denies"} Difficulty getting out of chair: {ACTIONS;DENIES/REPORTS:21021675::"Denies"} Difficulty using hands for taps, buttons, cutlery, and/or writing: {ACTIONS;DENIES/REPORTS:21021675::"Denies"}   No Rheumatology ROS completed.   PMFS History:  Patient Active Problem List   Diagnosis Date Noted  . Encounter for long-term current use of medication 10/05/2016  . Acute pain of right shoulder 10/05/2016  . Stage 2 chronic kidney disease 10/05/2016  . Malignant neoplasm of right female breast ([place]) 10/05/2016  . Glaucoma of both eyes 10/05/2016  . Anxiety 10/05/2016  . Renal calcinosis 10/05/2016  . Dyslipidemia 10/05/2016  . Osteoarthritis of both knees 10/03/2016  . Idiopathic chronic gout, unspecified site, without tophus (tophi) 10/03/2016  . Unilateral primary osteoarthritis, right hip 10/03/2016  . Hypotension 09/26/2012  . ICD-St.Jude 06/21/2012  . Non-ischemic cardiomyopathy ([place]) 03/27/2012  . A-fib ([place]) 09/20/2011  . Chronic systolic heart failure ([place])   . OBSTRUCTIVE SLEEP APNEA 10/28/2010  . Secondary cardiomyopathy ([place]) 10/19/2010  . DVT 10/19/2010  . LBBB (left bundle branch block) 08/18/2010  . Morbid (severe) obesity due to excess calories ([place]) 08/17/2010  . DEPRESSION 08/17/2010  . OTHER CHRONIC PAIN 08/17/2010  .  GLAUCOMA 08/17/2010  . Essential hypertension 08/17/2010  . Dissection of aorta ([place]) 08/17/2010  . SHINGLES, HX OF 08/17/2010    Past Medical History:  Diagnosis Date  . 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 Arthritis    lower back  . Atrial fibrillation ([place])    post op (left thoracotomy) 11/12 req amio/DCCV  . Breast cancer ([place]) 07/2009   Stage 3 lumpectomy, radiation, chemotherapy; finished chemo October, felt to be in remission  . Cardiac defibrillator in place    St. Jude (JA) 9/12; LV lead placement unsuccessful;  s/p left thoracotomy with placement of epicardial LV lead 09/2011 (Dr. PVT)  . CHF (congestive heart failure) ([place])   . Chronic systolic dysfunction of left ventricle   . Depression   . DVT (deep venous thrombosis) ([place]) 10/18/2010  . GERD (gastroesophageal reflux disease)   . Glaucoma   . Hiatal hernia    small hiatal hernia  . History of shingles   . Hyperlipidemia   . Hypertension   . Left bundle branch block   . Nonischemic cardiomyopathy ([place])    Admission 12/11 with EF 25%, normal coronary arteries by cath 12/11; NICM presumed to be secondary to chemotherapy for breast cancer  . Obesity   . [name]    with exertion    Family History  Problem Relation Age of Onset  . Hypertension Mother   . Hypertension Father   . Hypertension Brother   . Hypertension Maternal Grandfather   . Heart attack Maternal Grandfather        MI  . Coronary artery disease Maternal Grandfather   . Hypertension Paternal Grandfather   . Heart  attack Paternal Grandfather        MI  . Coronary artery disease Paternal Grandfather   . Hypertension Brother    Past Surgical History:  Procedure Laterality Date  . BREAST LUMPECTOMY     Right breast  . CARDIAC CATHETERIZATION     2011  . CARDIAC DEFIBRILLATOR PLACEMENT    . EP IMPLANTABLE DEVICE N/A 01/12/2016   STJ ICD Unify Assura gen change, Dr. [name]  . OOPHORECTOMY     Single  . THORACOTOMY   09/16/2011   Procedure: THORACOTOMY MAJOR;  Surgeon: Tharon Aquas Adelene Idler, MD;  Location: [facility] OR;  Service: Thoracic;  Laterality: Left;  left anterolateral Thoracotomy for placement of St. Jude epicardial pacing lead   . TONSILLECTOMY    . TUBAL LIGATION     Bilateral   Social History   Social History Narrative   Married   One child   Lives in [place] with spouse and mother in law   Previously worked as a Art gallery manager for [employer]      She is a Leisure centre manager counsel member.     Objective: Vital Signs: There were no vitals taken for this visit.   Physical Exam   Musculoskeletal Exam: ***  CDAI Exam: No CDAI exam completed.    Investigation: No additional findings. CBC Latest Ref Rng & Units 01/04/2016 09/22/2011 09/21/2011  WBC 4.0 - 10.5 K/uL 7.4 5.5 6.7  Hemoglobin 12.0 - 15.0 g/dL 13.9 11.2(L) 10.9(L)  Hematocrit 36.0 - 46.0 % 40.8 34.1(L) 33.2(L)  Platelets 150 - 400 K/uL 154 142(L) 129(L)   CMP Latest Ref Rng & Units 03/22/2017 02/20/2017 09/30/2016  Glucose 65 - 99 mg/dL 91 88 84  BUN 6 - 20 mg/dL 12 27(H) 17  Creatinine 0.44 - 1.00 mg/dL 1.19(H) 1.24(H) 1.21(H)  Sodium 135 - 145 mmol/L 139 138 139  Potassium 3.5 - 5.1 mmol/L 3.6 3.6 3.7  Chloride 101 - 111 mmol/L 104 101 102  CO2 22 - 32 mmol/L 25 24 29   Calcium 8.9 - 10.3 mg/dL 9.4 9.5 9.4  Total Protein 6.0 - 8.3 g/dL - - -  Total Bilirubin 0.3 - 1.2 mg/dL - - -  Alkaline Phos 39 - 117 U/L - - -  AST 0 - 37 U/L - - -  ALT 0 - 35 U/L - - -    Imaging: No results found.  Speciality Comments: No specialty comments available.    Procedures:  No procedures performed Allergies: Alphagan [brimonidine]; Mobic [meloxicam]; Nsaids; Polyvinyl alcohol; Statins; Tolmetin; Travatan z [travoprost (bak free)]; and Tape   Assessment / Plan:     Visit Diagnoses: No diagnosis found.    Orders: No orders of the defined types were placed in this encounter.  No orders of the defined types were placed in this  encounter.   Face-to-face time spent with patient was *** minutes. 50% of time was spent in counseling and coordination of care.  Follow-Up Instructions: No Follow-up on file.   Earnestine Mealing, NT  Note - This record has been created using Editor, commissioning.  Chart creation errors have been sought, but may not always  have been located. Such creation errors do not reflect on  the standard of medical care.

## 2017-08-18 ENCOUNTER — Encounter: Payer: Self-pay | Admitting: Cardiology

## 2017-08-21 ENCOUNTER — Encounter (HOSPITAL_COMMUNITY): Payer: Self-pay | Admitting: Internal Medicine

## 2017-08-21 ENCOUNTER — Ambulatory Visit (HOSPITAL_COMMUNITY)
Admission: RE | Admit: 2017-08-21 | Discharge: 2017-08-21 | Disposition: A | Discharge status: Home / Self Care | Payer: PRIVATE HEALTH INSURANCE | Source: Ambulatory Visit | Attending: Internal Medicine | Admitting: Internal Medicine

## 2017-08-21 VITALS — BP 128/88 | HR 78 | Wt 229.4 lb

## 2017-08-21 DIAGNOSIS — I447 Left bundle-branch block, unspecified: Secondary | ICD-10-CM | POA: Insufficient documentation

## 2017-08-21 DIAGNOSIS — M7989 Other specified soft tissue disorders: Secondary | ICD-10-CM | POA: Diagnosis not present

## 2017-08-21 DIAGNOSIS — I7101 Dissection of thoracic aorta: Secondary | ICD-10-CM

## 2017-08-21 DIAGNOSIS — E785 Hyperlipidemia, unspecified: Secondary | ICD-10-CM | POA: Diagnosis not present

## 2017-08-21 DIAGNOSIS — I10 Essential (primary) hypertension: Secondary | ICD-10-CM | POA: Diagnosis not present

## 2017-08-21 DIAGNOSIS — Z853 Personal history of malignant neoplasm of breast: Secondary | ICD-10-CM | POA: Insufficient documentation

## 2017-08-21 DIAGNOSIS — Z7982 Long term (current) use of aspirin: Secondary | ICD-10-CM | POA: Diagnosis not present

## 2017-08-21 DIAGNOSIS — Z9889 Other specified postprocedural states: Secondary | ICD-10-CM | POA: Insufficient documentation

## 2017-08-21 DIAGNOSIS — I5022 Chronic systolic (congestive) heart failure: Secondary | ICD-10-CM | POA: Diagnosis not present

## 2017-08-21 DIAGNOSIS — I110 Hypertensive heart disease with heart failure: Secondary | ICD-10-CM | POA: Insufficient documentation

## 2017-08-21 DIAGNOSIS — I480 Paroxysmal atrial fibrillation: Secondary | ICD-10-CM

## 2017-08-21 DIAGNOSIS — I429 Cardiomyopathy, unspecified: Secondary | ICD-10-CM | POA: Diagnosis present

## 2017-08-21 DIAGNOSIS — I872 Venous insufficiency (chronic) (peripheral): Secondary | ICD-10-CM | POA: Diagnosis not present

## 2017-08-21 DIAGNOSIS — Z9221 Personal history of antineoplastic chemotherapy: Secondary | ICD-10-CM | POA: Diagnosis not present

## 2017-08-21 DIAGNOSIS — Z79899 Other long term (current) drug therapy: Secondary | ICD-10-CM | POA: Diagnosis not present

## 2017-08-21 DIAGNOSIS — Z95810 Presence of automatic (implantable) cardiac defibrillator: Secondary | ICD-10-CM | POA: Diagnosis not present

## 2017-08-21 DIAGNOSIS — E669 Obesity, unspecified: Secondary | ICD-10-CM | POA: Insufficient documentation

## 2017-08-21 DIAGNOSIS — K449 Diaphragmatic hernia without obstruction or gangrene: Secondary | ICD-10-CM | POA: Insufficient documentation

## 2017-08-21 DIAGNOSIS — H409 Unspecified glaucoma: Secondary | ICD-10-CM | POA: Insufficient documentation

## 2017-08-21 DIAGNOSIS — G4733 Obstructive sleep apnea (adult) (pediatric): Secondary | ICD-10-CM | POA: Insufficient documentation

## 2017-08-21 DIAGNOSIS — M1990 Unspecified osteoarthritis, unspecified site: Secondary | ICD-10-CM | POA: Insufficient documentation

## 2017-08-21 DIAGNOSIS — Z8619 Personal history of other infectious and parasitic diseases: Secondary | ICD-10-CM | POA: Insufficient documentation

## 2017-08-21 DIAGNOSIS — I71019 Dissection of thoracic aorta, unspecified: Secondary | ICD-10-CM

## 2017-08-21 DIAGNOSIS — F329 Major depressive disorder, single episode, unspecified: Secondary | ICD-10-CM | POA: Diagnosis not present

## 2017-08-21 MED ORDER — SACUBITRIL-VALSARTAN 49-51 MG PO TABS: 1.0000 | ORAL_TABLET | Freq: Two times a day (BID) | ORAL | 3 refills | Status: DC

## 2017-08-21 MED ORDER — IVABRADINE HCL 5 MG PO TABS: 5.0000 mg | ORAL_TABLET | Freq: Two times a day (BID) | ORAL | 3 refills | Status: DC

## 2017-08-21 NOTE — Progress Notes (Signed)
ADVANCED HF CLINIC NOTE   Patient ID: [name], female   DOB: 1951-10-05, 66 y.o.   MRN: [id number] Oncologist: Dr. Philipp Ovens at [facility] EP: Dr. [name] Dr. Bea Graff in [place] HF: Clifford Coudriet  HPI: [name] is a 66 y.o. female a cardiac history that includes HTN, Type B aortic dissection (2010) managed with medical therapy by Dr. Prescott Gum and has healed, LBBB, systolic heart failure due to NICM with EF 10-15% in May 2013 which has decreased from 65% in 2010.  Cath Dec 2011 showed normal arteries.  She was diagnosed with breast cancer in 2009 s/p lumpectomy, XRT, and chemotherapy.  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   She underwent St Jude Bi-V ICD implantation Sept 2012 but unable to place LV lead.  She eventually brought back in Nov 2012 for left thoracotomy and placement of epicardial LV lead placement by Dr. Prescott Gum.  She also had a sleep study in Jan that showed mild OSA.  No clinical intervention occurred.    Recently completed cancer follow up at [facility]. She remains cancer free.   She presents today for regular follow up. At last visit started on Entresto. Continues to do the 56 day diet with low to no carbs, limited dairy, no pork.  Eating mostly protein, vegetable and fruits. She has lost > 30 lbs since last December. L leg more swollen than right chronically with venous insufficiency. She struggles with chronic and knee pain. Takes extra lasix on the weekend (20 mg).  At times holding her corlanor for HR in 40-50s. No syncope or presyncope.   Corevue- Thoracic impedence stable. 95% BiV pacing. No AT/AF. Personally reviewed with patient by Dr. Haroldine Laws.    10/10/2012 CPX Peak VO2: 10.6 % predicted peak VO2: 64.4% VE/VCO2 slope: 26.9 OUES: 1.18 Peak RER: 1.29 Ventilatory Threshold: 7.3 % predicted peak VO2: 44.4% Peak RR 23 Peak Ventilation: 29.8 VE/MVV: 32.6% PETCO2 at peak: 43  01/2014 CPX Peak  VO2: 10.3 (66.4% predicted peak VO2) VE/VCO2 slope: 25.0 OUES: 1.30 Peak RER: 1.20  CPX (8/16): Peak VO2 10.1 VE/VCO2 slope 28.4 RER 1.14 OUES 1.3 PFTs mildly restrictive No significant change compared to prior.  Limited more by obesity and restrictive lung physiology than heart (mild circulatory limitation).   ECHO 01/02/13 EF 44% Grade 1 diastolic dysfunction. RV ok. No PAH ECHO 07/10/13 EF 20% RV ok ECHO 10/13/14 EF 15-20% RV ok Echo 10/2015 EF 15%  Echo 09/30/16 LVEF 10-15%, Grade 1 DD, mild PI, Mild TR. Normal RV.  Review of systems complete and found to be negative unless listed in HPI.    Past Medical History:  Diagnosis Date  . 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 Arthritis    lower back  . Atrial fibrillation ([place])    post op (left thoracotomy) 11/12 req amio/DCCV  . Breast cancer ([place]) 07/2009   Stage 3 lumpectomy, radiation, chemotherapy; finished chemo October, felt to be in remission  . Cardiac defibrillator in place    St. Jude (JA) 9/12; LV lead placement unsuccessful;  s/p left thoracotomy with placement of epicardial LV lead 09/2011 (Dr. PVT)  . CHF (congestive heart failure) ([name])   . Chronic systolic dysfunction of left ventricle   . Depression   . DVT (deep venous thrombosis) ([place]) 10/18/2010  . GERD (gastroesophageal reflux disease)   . Glaucoma   . Hiatal hernia    small hiatal hernia  .  History of shingles   . Hyperlipidemia   . Hypertension   . Left bundle branch block   . Nonischemic cardiomyopathy ([place])    Admission 12/11 with EF 25%, normal coronary arteries by cath 12/11; NICM presumed to be secondary to chemotherapy for breast cancer  . Obesity   . [name]    with exertion   Current Outpatient Prescriptions  Medication Sig Dispense Refill  . albuterol (PROVENTIL HFA;VENTOLIN HFA) 108 (90 BASE) MCG/ACT inhaler Inhale 2 puffs into the lungs every 4 (four) hours as needed. [name]     . albuterol (PROVENTIL)  (2.5 MG/3ML) 0.083% nebulizer solution Take 2.5 mg by nebulization every 4 (four) hours as needed. [name]     . allopurinol (ZYLOPRIM) 300 MG tablet TAKE ONE TABLET BY MOUTH EVERY DAY 90 tablet 1  . ALPRAZolam (XANAX) 1 MG tablet Take 0.5 mg by mouth daily as needed for anxiety. Anxiety     . aspirin 81 MG EC tablet Take 81 mg by mouth daily.      [name] azelastine (OPTIVAR) 0.05 % ophthalmic solution Place 1 drop into both eyes daily.    . Bepotastine Besilate (BEPREVE) 1.5 % SOLN Place 1 drop into both eyes daily as needed. Dry eyes     . bimatoprost (LUMIGAN) 0.03 % ophthalmic solution Place 1 drop into both eyes at bedtime. Reported on 01/12/2016    . Calcium Carbonate-Vitamin D (CALCIUM-VITAMIN D3) 600-200 MG-UNIT TABS Take 1 tablet by mouth 2 (two) times daily.      . carvedilol (COREG) 12.5 MG tablet Take 12.5 mg by mouth 2 (two) times daily with a meal.    . carvedilol (COREG) 12.5 MG tablet TAKE ONE TABLET TWICE DAILY WITH A MEAL 180 tablet 3  . cholecalciferol (VITAMIN D) 1000 UNITS tablet Take 1,000 Units by mouth 2 (two) times daily.      . colchicine 0.6 MG tablet Take 0.6 mg by mouth daily as needed. (GOUT)  0  . [name] 7.5 MG TABS tablet TAKE ONE TABLET TWICE DAILY WITH A MEAL 60 tablet 3  . [name] 7.5 MG TABS tablet TAKE ONE TABLET TWICE DAILY WITH A MEAL 60 tablet 11  . [name] 7.5 MG TABS tablet TAKE ONE TABLET TWICE DAILY WITH A MEAL 60 tablet 11  . cyclobenzaprine (FLEXERIL) 10 MG tablet Take 1 tablet (10 mg total) by mouth 3 (three) times daily as needed for muscle spasms. 30 tablet 0  . diclofenac sodium (VOLTAREN) 1 % GEL APPLY THREE GRAMS TO THREE LARGE JOINTS UP TO THREE TIMES DAILY AS NEEDED 500 g 0  . docusate sodium (COLACE) 100 MG capsule Take 100 mg by mouth 2 (two) times daily.    [name] ENTRESTO 24-26 MG TAKE ONE TABLET BY MOUTH TWICE DAILY 180 tablet 3  . fexofenadine (ALLEGRA) 180 MG tablet Take 180 mg by mouth daily as needed. Allergies     .  fluticasone (FLONASE) 50 MCG/ACT nasal spray Place 2 sprays into the nose daily as needed. Allergies     . furosemide (LASIX) 40 MG tablet Take 1 tablet (40 mg total) by mouth daily. 90 tablet 3  . letrozole (FEMARA) 2.5 MG tablet Take 2.5 mg by mouth every evening.      . montelukast (SINGULAIR) 10 MG tablet Take 10 mg by mouth at bedtime. For allergies & congestion    . Multiple Vitamin (MULTIVITAMIN) tablet Take 1 tablet by mouth daily.      [name] omeprazole (  PRILOSEC) 20 MG capsule Take 20 mg by mouth 2 (two) times daily.      [name] PARoxetine (PAXIL) 20 MG tablet Take 20 mg by mouth daily.  1  . Polyethyl Glycol-Propyl Glycol (SYSTANE OP) Apply 1 drop to eye daily as needed (dry eyes).     [name] senna-docusate (SENOKOT-S) 8.6-50 MG per tablet Take 1 tablet by mouth daily as needed for mild constipation.     [name] spironolactone (ALDACTONE) 25 MG tablet TAKE ONE TABLET BY MOUTH EVERY DAY 90 tablet 3  . traMADol (ULTRAM) 50 MG tablet Take 50 mg by mouth every 6 (six) hours as needed for pain.     No current facility-administered medications for this encounter.    PHYSICAL EXAM: Vitals:   08/21/17 1154  BP: 128/88  Pulse: 78  SpO2: 97%  Weight: 229 lb 6.4 oz (104.1 kg)   Wt Readings from Last 3 Encounters:  08/21/17 229 lb 6.4 oz (104.1 kg)  04/26/17 232 lb (105.2 kg)  02/20/17 240 lb 12.8 oz (109.2 kg)   General: Well appearing. No resp difficulty. HEENT: Normal Neck: Supple. JVP 5-6. Carotids 2+ bilat; no bruits. No thyromegaly or nodule noted. Cor: PMI nondisplaced. RRR, No M/G/R noted Lungs: CTAB, normal effort. Abdomen: Obese Soft, non-tender, non-distended, no HSM. No bruits or masses. +BS  Extremities: No cyanosis, clubbing, or rash. R no edema. LLE with mild venous insuffiencey but no pitting.  Neuro: Alert & orientedx3, cranial nerves grossly intact. moves all 4 extremities w/o difficulty. Affect pleasant   ASSESSMENT & PLAN: 1. Chronic systolic CHF: Nonischemic cardiomyopathy.   Echo 09/30/16 LVEF 10-15%, Grade 1 DD, mild PI, Mild TR. Normal RV. s/p St Jude Bi-V ICD with epicardial LV lead. -- CPX in 8/16 was similar to the past: it appeared that the major limitation was from body habitus/deconditioning and not cardiac - Chronic NYHA Class II-III symptoms. Mostly limited by arthritis.  - Volume status stable on exam.  - Continue lasix 40 mg daily. Continue to take extra 20 mg as needed.  - Continue coreg 12.5 mg BID.  - Increase Entresto to 49/51 mg BID - Continue spironolactone 25 daily.  - Decrease corlanor to 5 mg BID.  - Reinforced fluid restriction to < 2 L daily, sodium restriction to less than 2000 mg daily, and the importance of daily weights.      2. Paroxysmal atrial fibrillation:  - This was post-op.  - Remains in NSR on exam and No AT/AF by Corevue.  - Continue ASA 81 mg daily.  - If recurs, will need antiocoag with CHA2DS2/VAS of 3.  3. Type B aortic dissection:  - Stable. BP has not been elevated.  4. HTN - Meds as above.   Doing well overall. Meds as above. Labs in 10-14 days with Entresto increase.   RTC 3 months with Echo  [name], PA-C 08/21/2017   Patient seen and examined with the above-signed Advanced Practice Provider and/or Housestaff. I personally reviewed laboratory data, imaging studies and relevant notes. I independently examined the patient and formulated the important aspects of the plan. I have edited the note to reflect any of my changes or salient points. I have personally discussed the plan with the patient and/or family.  Continues to do fairly well despite persistent, severe LV dysfunction. Remains NYHA II-III. Continues to struggle with severe arthritis but symptoms somewhat improved with 30 pound weight loss. Volume status looks good on exam and by ICD interrogation (done personally). Doing well  with biv pacing. Will decrease corlanor as HRs have been slow at times. Increase Entresto. Will need repeat echo.  Hopefully we will begin to see some improvement in LV function.   Glori Bickers, MD  12:33 PM

## 2017-08-21 NOTE — Patient Instructions (Signed)
Please have labs drawn in 10-14 days, see attached prescription.  DECREASE [name] to 5 mg (1 Tablet) Twice Daily  INCREASE Entresto to 49-51 mg (1 Tablet) Twice Daily  Please call us in December to schedule your Echocardiogram and Follow up.

## 2017-08-24 LAB — CUP PACEART REMOTE DEVICE CHECK
Brady Statistic AP VS Percent: 1 %
Brady Statistic AS VP Percent: 90 %
Brady Statistic AS VS Percent: 3.6 %
Date Time Interrogation Session: 20181015060026
HighPow Impedance: 92 Ohm
HighPow Impedance: 92 Ohm
Implantable Lead Implant Date: 20120914
Implantable Lead Implant Date: 20120914
Implantable Lead Location: 753858
Implantable Lead Serial Number: 194059
Lead Channel Impedance Value: 380 Ohm
Lead Channel Pacing Threshold Amplitude: 0.75 V
Lead Channel Sensing Intrinsic Amplitude: 1.2 mV
Lead Channel Setting Pacing Amplitude: 1.75 V
Lead Channel Setting Pacing Amplitude: 2 V
Lead Channel Setting Pacing Pulse Width: 0.5 ms
Lead Channel Setting Sensing Sensitivity: 0.5 mV
MDC IDC LEAD IMPLANT DT: 20121116
MDC IDC LEAD LOCATION: 753859
MDC IDC LEAD LOCATION: 753860
MDC IDC MSMT BATTERY REMAINING LONGEVITY: 61 mo
MDC IDC MSMT BATTERY REMAINING PERCENTAGE: 77 %
MDC IDC MSMT BATTERY VOLTAGE: 2.98 V
MDC IDC MSMT LEADCHNL LV IMPEDANCE VALUE: 280 Ohm
MDC IDC MSMT LEADCHNL LV PACING THRESHOLD AMPLITUDE: 2 V
MDC IDC MSMT LEADCHNL LV PACING THRESHOLD PULSEWIDTH: 0.5 ms
MDC IDC MSMT LEADCHNL RA PACING THRESHOLD PULSEWIDTH: 0.5 ms
MDC IDC MSMT LEADCHNL RV IMPEDANCE VALUE: 400 Ohm
MDC IDC MSMT LEADCHNL RV PACING THRESHOLD AMPLITUDE: 1 V
MDC IDC MSMT LEADCHNL RV PACING THRESHOLD PULSEWIDTH: 0.7 ms
MDC IDC MSMT LEADCHNL RV SENSING INTR AMPL: 11.7 mV
MDC IDC PG IMPLANT DT: 20170314
MDC IDC PG SERIAL: 7328650
MDC IDC SET LEADCHNL LV PACING AMPLITUDE: 2.5 V
MDC IDC SET LEADCHNL RV PACING PULSEWIDTH: 0.7 ms
MDC IDC STAT BRADY AP VP PERCENT: 4.9 %
MDC IDC STAT BRADY RA PERCENT PACED: 3.9 %

## 2017-08-29 ENCOUNTER — Ambulatory Visit: Payer: PRIVATE HEALTH INSURANCE | Admitting: Rheumatology

## 2017-09-05 NOTE — Progress Notes (Signed)
Office Visit Note  Patient: [name]             Date of Birth: 1950/12/23           MRN: [id number]             PCP: [name], MD Referring: [name], MD Visit Date: 09/14/2017 Occupation: @GUAROCC @    Subjective:  Other (BIL knee pain/swelling)   History of Present Illness: [name] is a 66 y.o. female with history of osteoarthritis and gout. She states she's been having pain and discomfort in her bilateral knee joints. She states her left knee joints swell at times. She's also having discomfort in her right hip joint.she has not had a gout flare in a long time. She's been taking allopurinol on a regular basis. She states she has not used colchicine since her initial episode of gout.patient reports discomfort in her lower back as well. She states she's and discomfort almost all day sometimes. She's been taking tramadol 3 times a day to relieve pain.  Activities of Daily Living:  Patient reports morning stiffness for all day hours.   Patient Reports nocturnal pain.  Difficulty dressing/grooming: Denies Difficulty climbing stairs: Reports Difficulty getting out of chair: Reports Difficulty using hands for taps, buttons, cutlery, and/or writing: Denies   Review of Systems  Constitutional: Positive for fatigue. Negative for night sweats, weight gain, weight loss and weakness.  HENT: Positive for mouth dryness. Negative for mouth sores, trouble swallowing, trouble swallowing and nose dryness.   Eyes: Negative for pain, redness, visual disturbance and dryness.  Respiratory: Negative for cough, shortness of breath and difficulty breathing.   Cardiovascular: Negative for chest pain, palpitations, hypertension, irregular heartbeat and swelling in legs/feet.  Gastrointestinal: Positive for constipation. Negative for blood in stool and diarrhea.  Endocrine: Negative for increased urination.  Genitourinary: Negative for vaginal dryness.  Musculoskeletal: Positive  for arthralgias, joint pain, myalgias, morning stiffness and myalgias. Negative for joint swelling, muscle weakness and muscle tenderness.  Skin: Negative for color change, rash, hair loss, skin tightness, ulcers and sensitivity to sunlight.  Allergic/Immunologic: Negative for susceptible to infections.  Neurological: Negative for dizziness, memory loss and night sweats.  Hematological: Negative for swollen glands.  Psychiatric/Behavioral: Positive for sleep disturbance. Negative for depressed mood. The patient is nervous/anxious.     PMFS History:  Patient Active Problem List   Diagnosis Date Noted  . Encounter for long-term current use of medication 10/05/2016  . Acute pain of right shoulder 10/05/2016  . Stage 2 chronic kidney disease 10/05/2016  . Malignant neoplasm of right female breast ([place]) 10/05/2016  . Glaucoma of both eyes 10/05/2016  . Anxiety 10/05/2016  . Renal calcinosis 10/05/2016  . Dyslipidemia 10/05/2016  . Osteoarthritis of both knees 10/03/2016  . Idiopathic chronic gout, unspecified site, without tophus (tophi) 10/03/2016  . Unilateral primary osteoarthritis, right hip 10/03/2016  . Hypotension 09/26/2012  . ICD-St.Jude 06/21/2012  . Non-ischemic cardiomyopathy ([place]) 03/27/2012  . A-fib ([place]) 09/20/2011  . Chronic systolic heart failure ([place])   . OBSTRUCTIVE SLEEP APNEA 10/28/2010  . Secondary cardiomyopathy ([place]) 10/19/2010  . DVT 10/19/2010  . LBBB (left bundle branch block) 08/18/2010  . Morbid (severe) obesity due to excess calories ([place]) 08/17/2010  . DEPRESSION 08/17/2010  . OTHER CHRONIC PAIN 08/17/2010  . GLAUCOMA 08/17/2010  . Essential hypertension 08/17/2010  . Dissection of aorta ([place]) 08/17/2010  . SHINGLES, HX OF 08/17/2010    Past Medical History:  Diagnosis  Date  . 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 Arthritis    lower back  . Atrial fibrillation ([place])    post op (left thoracotomy) 11/12 req amio/DCCV  . Breast cancer ([place])  07/2009   Stage 3 lumpectomy, radiation, chemotherapy; finished chemo October, felt to be in remission  . Cardiac defibrillator in place    St. Jude (JA) 9/12; LV lead placement unsuccessful;  s/p left thoracotomy with placement of epicardial LV lead 09/2011 (Dr. PVT)  . CHF (congestive heart failure) ([place])   . Chronic systolic dysfunction of left ventricle   . Depression   . DVT (deep venous thrombosis) ([place]) 10/18/2010  . GERD (gastroesophageal reflux disease)   . Glaucoma   . Hiatal hernia    small hiatal hernia  . History of shingles   . Hyperlipidemia   . Hypertension   . Left bundle branch block   . Nonischemic cardiomyopathy ([place])    Admission 12/11 with EF 25%, normal coronary arteries by cath 12/11; NICM presumed to be secondary to chemotherapy for breast cancer  . Obesity   . [name]    with exertion    Family History  Problem Relation Age of Onset  . Hypertension Mother   . Hypertension Father   . Hypertension Brother   . Hypertension Maternal Grandfather   . Heart attack Maternal Grandfather        MI  . Coronary artery disease Maternal Grandfather   . Hypertension Paternal Grandfather   . Heart attack Paternal Grandfather        MI  . Coronary artery disease Paternal Grandfather   . Hypertension Brother    Past Surgical History:  Procedure Laterality Date  . BREAST LUMPECTOMY     Right breast  . CARDIAC CATHETERIZATION     2011  . CARDIAC DEFIBRILLATOR PLACEMENT    . EP IMPLANTABLE DEVICE N/A 01/12/2016   STJ ICD Unify Assura gen change, Dr. [name]  . OOPHORECTOMY     Single  . THORACOTOMY  09/16/2011   Procedure: THORACOTOMY MAJOR;  Surgeon: Tharon Aquas Adelene Idler, MD;  Location[facility] OR;  Service: Thoracic;  Laterality: Left;  left anterolateral Thoracotomy for placement of St. Jude epicardial pacing lead   . TONSILLECTOMY    . TUBAL LIGATION     Bilateral   Social History   Social History Narrative   Married   One child   Lives in  [place] with spouse and mother in law   Previously worked as a Art gallery manager for [employer]      She is a Leisure centre manager counsel member.     Objective: Vital Signs: BP 118/60 (BP Location: Left Arm, Patient Position: Sitting, Cuff Size: Normal)   Pulse 72   Resp 18   Ht 5\' 2"  (1.575 m)   Wt 231 lb (104.8 kg)   BMI 42.25 kg/m    Physical Exam  Constitutional: She is oriented to person, place, and time. She appears well-developed and well-nourished.  HENT:  Head: Normocephalic and atraumatic.  Eyes: Conjunctivae and EOM are normal.  Neck: Normal range of motion.  Cardiovascular: Normal rate, regular rhythm, normal heart sounds and intact distal pulses.  Pacemaker and defibrillator  Pulmonary/Chest: Effort normal and breath sounds normal.  Abdominal: Soft. Bowel sounds are normal.  Lymphadenopathy:    She has no cervical adenopathy.  Neurological: She is alert and oriented to person, place, and time.  Skin: Skin is warm and dry. Capillary  refill takes less than 2 seconds.  Psychiatric: She has a normal mood and affect. Her behavior is normal.  Nursing note and vitals reviewed.    Musculoskeletal Exam: C-spine and thoracic lumbar spine limited range of motion.houlder joints elbow joints wrist joints MCPs PIPs DIPs with good range of motion. She has  Limited range of motion of  Right hip with discomfort. Left hip was good range of motion. She has discomfort with range of motion of bilateral knee joints some warmth in her left knee joint. She crepitus in her bilateral knee joints. No Baker's cyst was noted.  CDAI Exam: No CDAI exam completed.    Investigation: No additional findings. CBC Latest Ref Rng & Units 01/04/2016 09/22/2011 09/21/2011  WBC 4.0 - 10.5 K/uL 7.4 5.5 6.7  Hemoglobin 12.0 - 15.0 g/dL 13.9 11.2(L) 10.9(L)  Hematocrit 36.0 - 46.0 % 40.8 34.1(L) 33.2(L)  Platelets 150 - 400 K/uL 154 142(L) 129(L)   CMP Latest Ref Rng & Units 03/22/2017 02/20/2017 09/30/2016    Glucose 65 - 99 mg/dL 91 88 84  BUN 6 - 20 mg/dL 12 27(H) 17  Creatinine 0.44 - 1.00 mg/dL 1.19(H) 1.24(H) 1.21(H)  Sodium 135 - 145 mmol/L 139 138 139  Potassium 3.5 - 5.1 mmol/L 3.6 3.6 3.7  Chloride 101 - 111 mmol/L 104 101 102  CO2 22 - 32 mmol/L 25 24 29   Calcium 8.9 - 10.3 mg/dL 9.4 9.5 9.4  Total Protein 6.0 - 8.3 g/dL - - -  Total Bilirubin 0.3 - 1.2 mg/dL - - -  Alkaline Phos 39 - 117 U/L - - -  AST 0 - 37 U/L - - -  ALT 0 - 35 U/L - - -    Imaging: Xr Hip Unilat W Or W/o Pelvis 2-3 Views Right  Result Date: 09/14/2017 Severe joint space narrowing was noted. Some cystic changes were noted. SI joint had some osteoarthritic changes as well. Impression: Severe osteoarthritis of the right hip joint.  Xr Knee 3 View Left  Result Date: 09/14/2017 Moderate to severe lateral compartment narrowing with intercondylar osteophytes. Moderate patellofemoral narrowing. No chondrocalcinosis. Impression: Moderate to severe osteoarthritis with valgus deformity. Moderate chondromalacia patella.  Xr Knee 3 View Right  Result Date: 09/14/2017 Moderate medial compartment narrowing. Moderate patellofemoral narrowing. No chondrocalcinosis was noted. Impression moderate osteoarthritis and moderate chondromalacia patella   Speciality Comments: No specialty comments available.    Procedures:  No procedures performed Allergies: Alphagan [brimonidine]; Mobic [meloxicam]; Nsaids; Polyvinyl alcohol; Statins; Tolmetin; Travatan z [travoprost (bak free)]; and Tape   Assessment / Plan:     Visit Diagnoses: Idiopathic chronic gout of multiple sites without tophus - allopurinol 300 mg by mouth daily, colchicine when necessary. Patient denies any episodes of gout in long time.  Primary osteoarthritis of both knees - not a surgical candidate per Cardiology - she continues to have increased pain and discomfort in her bilateral knee joints. She's been having increased pain and swelling in her left  knee. She has done well with Visco supplement injections in the past and wants to reapply for that. We will apply for Visco supplement injection for bilateral knee joints.Plan: XR KNEE 3 VIEW RIGHT, XR KNEE 3 VIEW LEFT X-ray findings were discussed.  Primary osteoarthritis of right hip: She has limited range of motion of her right hip joint with discomfort. - Plan: XR HIP UNILAT W OR W/O PELVIS 2-3 VIEWS RIGHT  Other chronic pain: She is on tramadol through her PCP.  Other medical problems  are listed as follows:  Chronic systolic heart failure ([place])  Non-ischemic cardiomyopathy ([place]) - Pacemaker and defibrillator  History of chronic kidney disease  History of obesity  Malignant neoplasm of right female breast, unspecified estrogen receptor status, unspecified site of breast [facility]) -  Lumpectomy September 2010, chemotherapy and radiation therapy  Dyslipidemia  Renal calcinosis  History of glaucoma  History of anxiety  History of hypertension  History of sleep apnea  History of DVT (deep vein thrombosis)  History of aortic dissection    Orders: Orders Placed This Encounter  Procedures  . XR KNEE 3 VIEW RIGHT  . XR KNEE 3 VIEW LEFT  . XR HIP UNILAT W OR W/O PELVIS 2-3 VIEWS RIGHT   No orders of the defined types were placed in this encounter.   Face-to-face time spent with patient was 30 minutes. Greater than50% of time was spent in counseling and coordination of care.  Follow-Up Instructions: Return in about 6 months (around 03/14/2018) for Osteoarthritis, Gout.   Bo Merino, MD  Note - This record has been created using Editor, commissioning.  Chart creation errors have been sought, but may not always  have been located. Such creation errors do not reflect on  the standard of medical care.

## 2017-09-14 ENCOUNTER — Ambulatory Visit: Payer: PRIVATE HEALTH INSURANCE | Admitting: Rheumatology

## 2017-09-14 ENCOUNTER — Ambulatory Visit (INDEPENDENT_AMBULATORY_CARE_PROVIDER_SITE_OTHER): Payer: PRIVATE HEALTH INSURANCE

## 2017-09-14 ENCOUNTER — Encounter: Payer: Self-pay | Admitting: Rheumatology

## 2017-09-14 VITALS — BP 118/60 | HR 72 | Resp 18 | Ht 62.0 in | Wt 231.0 lb

## 2017-09-14 DIAGNOSIS — M170 Bilateral primary osteoarthritis of knee: Secondary | ICD-10-CM

## 2017-09-14 DIAGNOSIS — I428 Other cardiomyopathies: Secondary | ICD-10-CM | POA: Diagnosis not present

## 2017-09-14 DIAGNOSIS — Z8679 Personal history of other diseases of the circulatory system: Secondary | ICD-10-CM

## 2017-09-14 DIAGNOSIS — M1611 Unilateral primary osteoarthritis, right hip: Secondary | ICD-10-CM

## 2017-09-14 DIAGNOSIS — C50911 Malignant neoplasm of unspecified site of right female breast: Secondary | ICD-10-CM | POA: Diagnosis not present

## 2017-09-14 DIAGNOSIS — Z86718 Personal history of other venous thrombosis and embolism: Secondary | ICD-10-CM

## 2017-09-14 DIAGNOSIS — Z95810 Presence of automatic (implantable) cardiac defibrillator: Secondary | ICD-10-CM

## 2017-09-14 DIAGNOSIS — G8929 Other chronic pain: Secondary | ICD-10-CM | POA: Diagnosis not present

## 2017-09-14 DIAGNOSIS — Z8639 Personal history of other endocrine, nutritional and metabolic disease: Secondary | ICD-10-CM | POA: Diagnosis not present

## 2017-09-14 DIAGNOSIS — I5022 Chronic systolic (congestive) heart failure: Secondary | ICD-10-CM

## 2017-09-14 DIAGNOSIS — Z8669 Personal history of other diseases of the nervous system and sense organs: Secondary | ICD-10-CM

## 2017-09-14 DIAGNOSIS — Z8659 Personal history of other mental and behavioral disorders: Secondary | ICD-10-CM

## 2017-09-14 DIAGNOSIS — M1A09X0 Idiopathic chronic gout, multiple sites, without tophus (tophi): Secondary | ICD-10-CM

## 2017-09-14 DIAGNOSIS — E785 Hyperlipidemia, unspecified: Secondary | ICD-10-CM

## 2017-09-14 DIAGNOSIS — Z87448 Personal history of other diseases of urinary system: Secondary | ICD-10-CM | POA: Diagnosis not present

## 2017-09-14 DIAGNOSIS — N29 Other disorders of kidney and ureter in diseases classified elsewhere: Secondary | ICD-10-CM

## 2017-09-14 MED ORDER — DICLOFENAC SODIUM 1 % TD GEL: TRANSDERMAL | 0 refills | Status: DC

## 2017-09-14 NOTE — Addendum Note (Signed)
Addended by: Carole Binning on: 09/14/2017 12:51 PM   Modules accepted: Orders

## 2017-09-15 ENCOUNTER — Telehealth: Payer: Self-pay

## 2017-09-15 ENCOUNTER — Telehealth (HOSPITAL_COMMUNITY): Payer: Self-pay | Admitting: Pharmacist

## 2017-09-15 NOTE — Telephone Encounter (Signed)
[name] 5 mg BID PA approved by [employer] through 09/15/18.   Ruta Hinds. Velva Harman, PharmD, BCPS, CPP Clinical Pharmacist Pager: [phone] Phone: [phone] 09/15/2017 1:16 PM

## 2017-09-15 NOTE — Progress Notes (Signed)
EPIC Encounter for ICM Monitoring  Patient Name: [name] is a 66 y.o. female Date: 09/15/2017 Primary Care Physican: [name], MD Primary Cardiologist:Bensimhon Electrophysiologist: Allred Dry Weight:unknown Bi-V Pacing: 95%           Attempted call to patient and unable to reach.  Left detailed message regarding transmission.  Transmission reviewed.    Thoracic impedance normal.  Prescribed dosage: Furosemide 40 mg 1 tablet daily. Per Dr Bensimhon's office visit note, 08/21/2017, she can continue to take extra 20 mg as needed.    Labs: 03/22/2017 Creatinine 1.19, BUN 12, Potassium 3.6, Sodium 139, EGFR 47-54 02/20/2017 Creatinine 1.24, BUN 27, Potassium 3.6, Sodium 138, EGFR 44-51 09/30/2016 Creatinine 1.21, BUN 17, Potassium 3.7, Sodium 139 01/04/2016 Creatinine 0.94, BUN 18, Potassium 3.7, Sodium 141  Recommendations: Left voice mail with ICM number and encouraged to call if experiencing any fluid symptoms.  Follow-up plan: ICM clinic phone appointment on 10/16/2017.    Copy of ICM check sent to Dr. [name].   3 month ICM trend: 09/14/2017    1 Year ICM trend:       [name], RN 09/15/2017 1:38 PM

## 2017-09-15 NOTE — Telephone Encounter (Signed)
Remote ICM transmission received.  Attempted call to patient and left detailed message per DPR regarding transmission and next ICM scheduled for 10/16/2017.  Advised to return call for any fluid symptoms or questions.

## 2017-09-18 ENCOUNTER — Telehealth (HOSPITAL_COMMUNITY): Payer: Self-pay | Admitting: Pharmacist

## 2017-09-18 NOTE — Telephone Encounter (Signed)
[name] 5 mg BID PA approved by [employer] through 09/15/18.   Ruta Hinds. Velva Harman, PharmD, BCPS, CPP Clinical Pharmacist Pager: [phone] Phone: [phone] 09/18/2017 9:39 AM

## 2017-10-11 ENCOUNTER — Other Ambulatory Visit (HOSPITAL_COMMUNITY): Payer: Self-pay | Admitting: Internal Medicine

## 2017-10-16 ENCOUNTER — Ambulatory Visit (INDEPENDENT_AMBULATORY_CARE_PROVIDER_SITE_OTHER): Payer: PRIVATE HEALTH INSURANCE

## 2017-10-16 DIAGNOSIS — Z95810 Presence of automatic (implantable) cardiac defibrillator: Secondary | ICD-10-CM

## 2017-10-16 DIAGNOSIS — I5022 Chronic systolic (congestive) heart failure: Secondary | ICD-10-CM | POA: Diagnosis not present

## 2017-10-19 NOTE — Progress Notes (Signed)
EPIC Encounter for ICM Monitoring  Patient Name: [name] is a 66 y.o. female Date: 10/19/2017 Primary Care Physican: [name], MD Primary Cardiologist:Bensimhon Electrophysiologist: Allred Dry Weight:unknown Bi-V Pacing: 95%      Transmission received.   Thoracic impedance normal.  Prescribed dosage:Furosemide 40 mg 1 tablet daily. Per Dr Bensimhon's office visit note, 08/21/2017, she can continue to take extra 20 mg as needed.   Labs: 03/22/2017 Creatinine 1.19, BUN 12, Potassium 3.6, Sodium 139, EGFR 47-54 02/20/2017 Creatinine 1.24, BUN 27, Potassium 3.6, Sodium 138, EGFR 44-51 09/30/2016 Creatinine 1.21, BUN 17, Potassium 3.7, Sodium 139 01/04/2016 Creatinine 0.94, BUN 18, Potassium 3.7, Sodium 141  Recommendations: None.  Follow-up plan: ICM clinic phone appointment on 11/16/2017.    Copy of ICM check sent to Dr. [name].   3 month ICM trend: 10/16/2017    1 Year ICM trend:       [name], RN 10/19/2017 4:23 PM

## 2017-11-16 ENCOUNTER — Ambulatory Visit (INDEPENDENT_AMBULATORY_CARE_PROVIDER_SITE_OTHER): Payer: PRIVATE HEALTH INSURANCE | Admitting: *Deleted

## 2017-11-16 DIAGNOSIS — Z95810 Presence of automatic (implantable) cardiac defibrillator: Secondary | ICD-10-CM | POA: Diagnosis not present

## 2017-11-16 DIAGNOSIS — Z4502 Encounter for adjustment and management of automatic implantable cardiac defibrillator: Secondary | ICD-10-CM | POA: Diagnosis not present

## 2017-11-16 DIAGNOSIS — I5022 Chronic systolic (congestive) heart failure: Secondary | ICD-10-CM

## 2017-11-16 DIAGNOSIS — I428 Other cardiomyopathies: Secondary | ICD-10-CM | POA: Diagnosis not present

## 2017-11-16 NOTE — Progress Notes (Signed)
Remote ICD transmission.   

## 2017-11-17 ENCOUNTER — Encounter: Payer: Self-pay | Admitting: Cardiology

## 2017-11-17 ENCOUNTER — Telehealth: Payer: Self-pay

## 2017-11-17 NOTE — Telephone Encounter (Signed)
Remote ICM transmission received.  Attempted call to patient and left detailed message per DPR regarding transmission and next ICM scheduled for 12/19/2017.  Advised to return call for any fluid symptoms or questions.

## 2017-11-17 NOTE — Progress Notes (Signed)
EPIC Encounter for ICM Monitoring  Patient Name: [name] is a 67 y.o. female Date: 11/17/2017 Primary Care Physican: [name], MD Primary Cardiologist:Bensimhon Electrophysiologist: Allred Dry Weight:unknown Bi-V Pacing: 95%      Attempted call to patient and unable to reach.  Left detailed message regarding transmission.  Transmission reviewed.    Thoracic impedance normal.  Prescribed dosage: Furosemide 40 mg 1 tablet daily. Per Dr Bensimhon'soffice visit note,08/21/2017, she can continue to take extra 20 mg as needed.  Labs: 03/22/2017 Creatinine 1.19, BUN 12, Potassium 3.6, Sodium 139, EGFR 47-54 02/20/2017 Creatinine 1.24, BUN 27, Potassium 3.6, Sodium 138, EGFR 44-51 09/30/2016 Creatinine 1.21, BUN 17, Potassium 3.7, Sodium 139 01/04/2016 Creatinine 0.94, BUN 18, Potassium 3.7, Sodium 141  Recommendations: Left voice mail with ICM number and encouraged to call if experiencing any fluid symptoms.  Follow-up plan: ICM clinic phone appointment on 12/19/2017.  Office appointment scheduled 12/14/2017 with Dr. Haroldine Laws.  Copy of ICM check sent to Dr. [name].   3 month ICM trend: 11/16/2017    1 Year ICM trend:       [name], RN 11/17/2017 2:20 PM

## 2017-11-21 LAB — CUP PACEART REMOTE DEVICE CHECK
Battery Remaining Longevity: 59 mo
Battery Remaining Percentage: 73 %
Battery Voltage: 2.98 V
Brady Statistic AP VP Percent: 5.1 %
Brady Statistic AP VS Percent: 1 %
Brady Statistic RA Percent Paced: 3.6 %
Date Time Interrogation Session: 20190117070017
HIGH POWER IMPEDANCE MEASURED VALUE: 87 Ohm
HIGH POWER IMPEDANCE MEASURED VALUE: 87 Ohm
Implantable Lead Implant Date: 20120914
Implantable Lead Implant Date: 20120914
Implantable Lead Location: 753858
Implantable Lead Location: 753859
Implantable Lead Location: 753860
Implantable Lead Model: 511212
Implantable Pulse Generator Implant Date: 20170314
Lead Channel Impedance Value: 290 Ohm
Lead Channel Impedance Value: 390 Ohm
Lead Channel Impedance Value: 400 Ohm
Lead Channel Pacing Threshold Amplitude: 0.875 V
Lead Channel Pacing Threshold Amplitude: 1.75 V
Lead Channel Pacing Threshold Pulse Width: 0.5 ms
Lead Channel Pacing Threshold Pulse Width: 0.5 ms
Lead Channel Pacing Threshold Pulse Width: 0.7 ms
Lead Channel Sensing Intrinsic Amplitude: 11.7 mV
Lead Channel Setting Pacing Amplitude: 2 V
Lead Channel Setting Pacing Pulse Width: 0.7 ms
Lead Channel Setting Sensing Sensitivity: 0.5 mV
MDC IDC LEAD IMPLANT DT: 20121116
MDC IDC LEAD SERIAL: 194059
MDC IDC MSMT LEADCHNL RA PACING THRESHOLD AMPLITUDE: 0.75 V
MDC IDC MSMT LEADCHNL RA SENSING INTR AMPL: 1 mV
MDC IDC SET LEADCHNL LV PACING AMPLITUDE: 2.5 V
MDC IDC SET LEADCHNL LV PACING PULSEWIDTH: 0.5 ms
MDC IDC SET LEADCHNL RA PACING AMPLITUDE: 1.75 V
MDC IDC STAT BRADY AS VP PERCENT: 88 %
MDC IDC STAT BRADY AS VS PERCENT: 4.2 %
Pulse Gen Serial Number: 7328650

## 2017-12-14 ENCOUNTER — Encounter (HOSPITAL_COMMUNITY): Payer: Self-pay | Admitting: Internal Medicine

## 2017-12-14 ENCOUNTER — Ambulatory Visit (HOSPITAL_COMMUNITY)
Admission: RE | Admit: 2017-12-14 | Discharge: 2017-12-14 | Disposition: A | Discharge status: Home / Self Care | Payer: PRIVATE HEALTH INSURANCE | Source: Ambulatory Visit | Attending: Internal Medicine | Admitting: Internal Medicine

## 2017-12-14 ENCOUNTER — Other Ambulatory Visit: Payer: Self-pay

## 2017-12-14 ENCOUNTER — Ambulatory Visit (HOSPITAL_BASED_OUTPATIENT_CLINIC_OR_DEPARTMENT_OTHER)
Admission: RE | Admit: 2017-12-14 | Discharge: 2017-12-14 | Disposition: A | Discharge status: Home / Self Care | Payer: PRIVATE HEALTH INSURANCE | Source: Ambulatory Visit | Attending: Internal Medicine | Admitting: Internal Medicine

## 2017-12-14 VITALS — BP 108/72 | HR 74 | Wt 235.8 lb

## 2017-12-14 DIAGNOSIS — I480 Paroxysmal atrial fibrillation: Secondary | ICD-10-CM | POA: Insufficient documentation

## 2017-12-14 DIAGNOSIS — I7101 Dissection of thoracic aorta: Secondary | ICD-10-CM

## 2017-12-14 DIAGNOSIS — Z853 Personal history of malignant neoplasm of breast: Secondary | ICD-10-CM | POA: Diagnosis not present

## 2017-12-14 DIAGNOSIS — I428 Other cardiomyopathies: Secondary | ICD-10-CM | POA: Diagnosis not present

## 2017-12-14 DIAGNOSIS — I5022 Chronic systolic (congestive) heart failure: Secondary | ICD-10-CM

## 2017-12-14 DIAGNOSIS — Z79899 Other long term (current) drug therapy: Secondary | ICD-10-CM | POA: Diagnosis not present

## 2017-12-14 DIAGNOSIS — Z7982 Long term (current) use of aspirin: Secondary | ICD-10-CM | POA: Insufficient documentation

## 2017-12-14 DIAGNOSIS — K219 Gastro-esophageal reflux disease without esophagitis: Secondary | ICD-10-CM | POA: Insufficient documentation

## 2017-12-14 DIAGNOSIS — Z9221 Personal history of antineoplastic chemotherapy: Secondary | ICD-10-CM | POA: Diagnosis not present

## 2017-12-14 DIAGNOSIS — Z95810 Presence of automatic (implantable) cardiac defibrillator: Secondary | ICD-10-CM | POA: Insufficient documentation

## 2017-12-14 DIAGNOSIS — I447 Left bundle-branch block, unspecified: Secondary | ICD-10-CM | POA: Diagnosis not present

## 2017-12-14 DIAGNOSIS — G4733 Obstructive sleep apnea (adult) (pediatric): Secondary | ICD-10-CM | POA: Diagnosis not present

## 2017-12-14 DIAGNOSIS — I71019 Dissection of thoracic aorta, unspecified: Secondary | ICD-10-CM

## 2017-12-14 DIAGNOSIS — F329 Major depressive disorder, single episode, unspecified: Secondary | ICD-10-CM | POA: Insufficient documentation

## 2017-12-14 DIAGNOSIS — I10 Essential (primary) hypertension: Secondary | ICD-10-CM | POA: Diagnosis not present

## 2017-12-14 DIAGNOSIS — E785 Hyperlipidemia, unspecified: Secondary | ICD-10-CM | POA: Insufficient documentation

## 2017-12-14 DIAGNOSIS — I110 Hypertensive heart disease with heart failure: Secondary | ICD-10-CM | POA: Diagnosis not present

## 2017-12-14 MED ORDER — FUROSEMIDE 40 MG PO TABS: 40.0000 mg | ORAL_TABLET | ORAL | 3 refills | Status: DC

## 2017-12-14 MED ORDER — SACUBITRIL-VALSARTAN 97-103 MG PO TABS: 1.0000 | ORAL_TABLET | Freq: Two times a day (BID) | ORAL | 6 refills | Status: DC

## 2017-12-14 NOTE — Addendum Note (Signed)
Encounter addended by: Scarlette Calico, RN on: 12/14/2017 11:39 AM  Actions taken: Order list changed, Sign clinical note

## 2017-12-14 NOTE — Progress Notes (Signed)
Echocardiogram 2D Echocardiogram has been performed.  [name] 12/14/2017, 11:05 AM

## 2017-12-14 NOTE — Progress Notes (Signed)
ADVANCED HF CLINIC NOTE   Patient ID: [name], female   DOB: [birth year], 67 y.o.   MRN: [id number] Oncologist: Dr. Philipp Ovens at [facility] EP: Dr. [name] Dr. Bea Graff in [place] HF: Mishon Blubaugh  HPI: [name] is a 67 y.o. female a cardiac history that includes HTN, Type B aortic dissection (2010) managed with medical therapy by Dr. Prescott Gum and has healed, LBBB, systolic heart failure due to NICM with EF 10-15% in May 2013 which has decreased from 65% in 2010.  Cath Dec 2011 showed normal arteries.  She was diagnosed with breast cancer in 2009 s/p lumpectomy, XRT, and chemotherapy.  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   She underwent St Jude Bi-V ICD implantation Sept 2012 but unable to place LV lead.  She eventually brought back in Nov 2012 for left thoracotomy and placement of epicardial LV lead placement by Dr. Prescott Gum.  She also had a sleep studythat showed mild OSA.  No clinical intervention occurred.    At last visit we decreased corlanor as HRs have been slow at times. Entresto increased.    She presents today for regular follow up. Main complain is diffuse arthralgias. Sees Dr. Patrecia Pour in Rheumatology. Main limitation to her ambulation. Earlier this month had an episode of orthostasis resolved with increased fluid intake. Occasional edema. Controlled with compression stockings. No orthopnea or PND.   Echo today EF 20% RV ok. Personally reviewed   Corevue- Volume status stable. No VT/AF  Biv pacing 93% Personally reviewed    10/10/2012 CPX Peak VO2: 10.6 % predicted peak VO2: 64.4% VE/VCO2 slope: 26.9 OUES: 1.18 Peak RER: 1.29 Ventilatory Threshold: 7.3 % predicted peak VO2: 44.4% Peak RR 23 Peak Ventilation: 29.8 VE/MVV: 32.6% PETCO2 at peak: 43  01/2014 CPX Peak VO2: 10.3 (66.4% predicted peak VO2) VE/VCO2 slope: 25.0 OUES: 1.30 Peak RER: 1.20  CPX (8/16): Peak VO2 10.1 VE/VCO2 slope  28.4 RER 1.14 OUES 1.3 PFTs mildly restrictive No significant change compared to prior.  Limited more by obesity and restrictive lung physiology than heart (mild circulatory limitation).   ECHO 01/02/13 EF 78% Grade 1 diastolic dysfunction. RV ok. No PAH ECHO 07/10/13 EF 20% RV ok ECHO 10/13/14 EF 15-20% RV ok Echo 10/2015 EF 15%  Echo 09/30/16 LVEF 10-15%, Grade 1 DD, mild PI, Mild TR. Normal RV.  Review of systems complete and found to be negative unless listed in HPI.    Past Medical History:  Diagnosis Date  . Aortic dissection ([place]) 12/2008   Type B  . Arthritis    lower back  . Atrial fibrillation ([place])    post op (left thoracotomy) 11/12 req amio/DCCV  . Breast cancer ([place]) 07/2009   Stage 3 lumpectomy, radiation, chemotherapy; finished chemo October, felt to be in remission  . Cardiac defibrillator in place    St. Jude (JA) 9/12; LV lead placement unsuccessful;  s/p left thoracotomy with placement of epicardial LV lead 09/2011 (Dr. PVT)  . CHF (congestive heart failure) ([place])   . Chronic systolic dysfunction of left ventricle   . Depression   . DVT (deep venous thrombosis) ([place]) 10/18/2010  . GERD (gastroesophageal reflux disease)   . Glaucoma   . Hiatal hernia    small hiatal hernia  . History of shingles   . Hyperlipidemia   . Hypertension   . Left bundle branch block   . Nonischemic cardiomyopathy [facility])    Admission 12/11 with EF  25%, normal coronary arteries by cath 12/11; NICM presumed to be secondary to chemotherapy for breast cancer  . Obesity   . [name]    with exertion   Current Outpatient Medications  Medication Sig Dispense Refill  . albuterol (PROVENTIL HFA;VENTOLIN HFA) 108 (90 BASE) MCG/ACT inhaler Inhale 2 puffs into the lungs every 4 (four) hours as needed. [name]     . albuterol (PROVENTIL) (2.5 MG/3ML) 0.083% nebulizer solution Take 2.5 mg by nebulization every 4 (four) hours as needed. [name]     .  allopurinol (ZYLOPRIM) 300 MG tablet TAKE ONE TABLET BY MOUTH EVERY DAY 90 tablet 1  . ALPRAZolam (XANAX) 1 MG tablet Take 0.5 mg by mouth daily as needed for anxiety. Anxiety     . aspirin 81 MG EC tablet Take 81 mg by mouth daily.      [name] azelastine (OPTIVAR) 0.05 % ophthalmic solution Place 1 drop into both eyes daily.    . Bepotastine Besilate (BEPREVE) 1.5 % SOLN Place 1 drop into both eyes daily as needed. Dry eyes     . bimatoprost (LUMIGAN) 0.03 % ophthalmic solution Place 1 drop into both eyes at bedtime. Reported on 01/12/2016    . Calcium Carbonate-Vitamin D (CALCIUM-VITAMIN D3) 600-200 MG-UNIT TABS Take 1 tablet by mouth 2 (two) times daily.      . carvedilol (COREG) 12.5 MG tablet Take 12.5 mg by mouth 2 (two) times daily with a meal.    . cholecalciferol (VITAMIN D) 1000 UNITS tablet Take 1,000 Units by mouth 2 (two) times daily.      . colchicine 0.6 MG tablet Take 0.6 mg by mouth daily as needed. (GOUT)  0  . cyclobenzaprine (FLEXERIL) 10 MG tablet Take 1 tablet (10 mg total) by mouth 3 (three) times daily as needed for muscle spasms. 30 tablet 0  . diclofenac sodium (VOLTAREN) 1 % GEL APPLY THREE GRAMS TO THREE LARGE JOINTS UP TO THREE TIMES DAILY AS NEEDED 500 g 0  . docusate sodium (COLACE) 100 MG capsule Take 100 mg by mouth 2 (two) times daily.    . fexofenadine (ALLEGRA) 180 MG tablet Take 180 mg by mouth daily as needed. Allergies     . fluticasone (FLONASE) 50 MCG/ACT nasal spray Place 2 sprays into the nose daily as needed. Allergies     . furosemide (LASIX) 40 MG tablet Take 1 tablet (40 mg total) by mouth daily. 90 tablet 3  . ivabradine ([name]) 5 MG TABS tablet Take 1 tablet (5 mg total) by mouth 2 (two) times daily with a meal. 60 tablet 3  . letrozole (FEMARA) 2.5 MG tablet Take 2.5 mg by mouth every evening.      . montelukast (SINGULAIR) 10 MG tablet Take 10 mg by mouth at bedtime. For allergies & congestion    . Multiple Vitamin (MULTIVITAMIN) tablet Take 1  tablet by mouth daily.      [name] 20 MG capsule Take 20 mg by mouth 2 (two) times daily.      [name] PARoxetine (PAXIL) 20 MG tablet Take 20 mg by mouth daily.  1  . Polyethyl Glycol-Propyl Glycol (SYSTANE OP) Apply 1 drop to eye daily as needed (dry eyes).     . sacubitril-valsartan (ENTRESTO) 49-51 MG Take 1 tablet by mouth 2 (two) times daily. 60 tablet 3  . senna-docusate (SENOKOT-S) 8.6-50 MG per tablet Take 1 tablet by mouth daily as needed for mild constipation.     [name]  spironolactone (ALDACTONE) 25 MG tablet TAKE ONE TABLET BY MOUTH EVERY DAY 90 tablet 3  . traMADol (ULTRAM) 50 MG tablet Take 50 mg by mouth every 6 (six) hours as needed for pain.     No current facility-administered medications for this encounter.    PHYSICAL EXAM: Vitals:   12/14/17 1109  BP: 108/72  Pulse: 74  SpO2: 98%  Weight: 235 lb 12 oz (106.9 kg)   Wt Readings from Last 3 Encounters:  12/14/17 235 lb 12 oz (106.9 kg)  09/14/17 231 lb (104.8 kg)  08/21/17 229 lb 6.4 oz (104.1 kg)   General:  Well appearing. No resp difficulty HEENT: normal Neck: supple. no JVD. Carotids 2+ bilat; no bruits. No lymphadenopathy or thryomegaly appreciated. Cor: PMI nondisplaced. Regular rate & rhythm. No rubs, gallops or murmurs. Lungs: clear Abdomen: obese soft, nontender, nondistended. No hepatosplenomegaly. No bruits or masses. Good bowel sounds. Extremities: no cyanosis, clubbing, rash, trace edema Neuro: alert & orientedx3, cranial nerves grossly intact. moves all 4 extremities w/o difficulty. Affect pleasant   ASSESSMENT & PLAN: 1. Chronic systolic CHF: Nonischemic cardiomyopathy.  Echo 09/30/16 LVEF 10-15%, Grade 1 DD, mild PI, Mild TR. Normal RV. s/p St Jude Bi-V ICD with epicardial LV lead. - Echo today EF 20% stable. (Personally reviewed) -- CPX in 8/16 was similar to the past: it appeared that the major limitation was from body habitus/deconditioning and not cardiac - Remains stable.  NYHA Class  II-III symptoms. Mostly limited by arthritis.  - Volume status stable on exam and by Corevue - With increasing Entresto will drop lasix back to 40mg  M/W/F + as needed  - Continue coreg 12.5 mg BID.  - Increase Entresto to 97/103 mg BID - Continue spironolactone 25 daily. Recent K was 3.8  - Continue corlanor to 5 mg BID.  - Reinforced fluid restriction to < 2 L daily, sodium restriction to less than 2000 mg daily, and the importance of daily weights.      2. Paroxysmal atrial fibrillation:  - This was post-op.  - Remains in NSR on exam and No AT/AF by Corevue.  - Continue ASA 81 mg daily.  - If recurs, will need antiocoag with CHA2DS2/VAS of 3.  3. Type B aortic dissection:  - Stable. BP has not been elevated.  4. HTN - Well controlled.  o  Glori Bickers, MD 12/14/2017

## 2017-12-14 NOTE — Patient Instructions (Signed)
Increase Entresto to 97/103 mg Twice daily   Decrease Furosemide to Monday, Wednesday and Friday and as needed  Your physician recommends that you schedule a follow-up appointment in: 4 months

## 2017-12-19 ENCOUNTER — Telehealth: Payer: Self-pay

## 2017-12-19 ENCOUNTER — Ambulatory Visit (INDEPENDENT_AMBULATORY_CARE_PROVIDER_SITE_OTHER): Payer: PRIVATE HEALTH INSURANCE

## 2017-12-19 DIAGNOSIS — Z95810 Presence of automatic (implantable) cardiac defibrillator: Secondary | ICD-10-CM

## 2017-12-19 DIAGNOSIS — I5022 Chronic systolic (congestive) heart failure: Secondary | ICD-10-CM

## 2017-12-19 NOTE — Telephone Encounter (Signed)
Remote ICM transmission received.  Attempted call to patient and left detailed message per DPR regarding transmission and next ICM scheduled for 01/19/2018.  Advised to return call for any fluid symptoms or questions.

## 2017-12-19 NOTE — Progress Notes (Signed)
EPIC Encounter for ICM Monitoring  Patient Name: [name] is a 67 y.o. female Date: 12/19/2017 Primary Care Physican: [name], MD Primary Cardiologist:Bensimhon Electrophysiologist: Allred Dry Weight:unknown Bi-V Pacing: 95%      Attempted call to patient and unable to reach.  Left detailed message regarding transmission.  Transmission reviewed.    Thoracic impedance normal.  Prescribed dosage: Furosemide 40 mg 1 tablet daily. Per Dr Bensimhon'soffice visit note,08/21/2017, she can continue to take extra 20 mg as needed.  Labs: 03/22/2017 Creatinine 1.19, BUN 12, Potassium 3.6, Sodium 139, EGFR 47-54 02/20/2017 Creatinine 1.24, BUN 27, Potassium 3.6, Sodium 138, EGFR 44-51 09/30/2016 Creatinine 1.21, BUN 17, Potassium 3.7, Sodium 139 01/04/2016 Creatinine 0.94, BUN 18, Potassium 3.7, Sodium 141  Recommendations: Left voice mail with ICM number and encouraged to call if experiencing any fluid symptoms.  Follow-up plan: ICM clinic phone appointment on 01/19/2018.   Copy of ICM check sent to Dr. [name].   3 month ICM trend: 12/19/2017    1 Year ICM trend:       [name], RN 12/19/2017 9:19 AM

## 2018-01-02 ENCOUNTER — Other Ambulatory Visit (HOSPITAL_COMMUNITY): Payer: Self-pay | Admitting: Internal Medicine

## 2018-01-09 ENCOUNTER — Other Ambulatory Visit: Payer: Self-pay | Admitting: Internal Medicine

## 2018-01-19 ENCOUNTER — Ambulatory Visit (INDEPENDENT_AMBULATORY_CARE_PROVIDER_SITE_OTHER): Payer: PRIVATE HEALTH INSURANCE

## 2018-01-19 DIAGNOSIS — I5022 Chronic systolic (congestive) heart failure: Secondary | ICD-10-CM | POA: Diagnosis not present

## 2018-01-19 DIAGNOSIS — Z95810 Presence of automatic (implantable) cardiac defibrillator: Secondary | ICD-10-CM

## 2018-01-19 NOTE — Progress Notes (Signed)
EPIC Encounter for ICM Monitoring  Patient Name: [name] is a 67 y.o. female Date: 01/19/2018 Primary Care Physican: [name], MD Primary Cardiologist:Bensimhon Electrophysiologist: Allred Dry Weight:unknown Bi-V Pacing: 92%           Attempted call to patient and unable to reach.  Left detailed message regarding transmission.  Transmission reviewed.    Thoracic impedance normal but was abnormal suggesting fluid accumulation from 01/05/2018 through 01/12/2018.  Prescribed dosage: Furosemide 40 mg 1 tablet daily. Per Dr Bensimhon'soffice visit note,08/21/2017, she can continue to take extra 20 mg as needed.  Labs: 03/22/2017 Creatinine 1.19, BUN 12, Potassium 3.6, Sodium 139, EGFR 47-54 02/20/2017 Creatinine 1.24, BUN 27, Potassium 3.6, Sodium 138, EGFR 44-51 09/30/2016 Creatinine 1.21, BUN 17, Potassium 3.7, Sodium 139 01/04/2016 Creatinine 0.94, BUN 18, Potassium 3.7, Sodium 141  Recommendations: Left voice mail with ICM number and encouraged to call if experiencing any fluid symptoms.  Follow-up plan: ICM clinic phone appointment on 02/22/2018.    Copy of ICM check sent to Dr. [name].   3 month ICM trend: 01/19/2018    1 Year ICM trend:       [name], RN 01/19/2018 12:19 PM

## 2018-01-30 ENCOUNTER — Other Ambulatory Visit (HOSPITAL_COMMUNITY): Payer: Self-pay | Admitting: *Deleted

## 2018-01-30 MED ORDER — IVABRADINE HCL 5 MG PO TABS: 5.0000 mg | ORAL_TABLET | Freq: Two times a day (BID) | ORAL | 3 refills | Status: DC

## 2018-02-02 ENCOUNTER — Other Ambulatory Visit (HOSPITAL_COMMUNITY): Payer: Self-pay

## 2018-02-02 MED ORDER — SPIRONOLACTONE 25 MG PO TABS: 25.0000 mg | ORAL_TABLET | Freq: Every day | ORAL | 3 refills | Status: DC

## 2018-02-12 ENCOUNTER — Telehealth (HOSPITAL_COMMUNITY): Payer: Self-pay | Admitting: Pharmacist

## 2018-02-12 NOTE — Telephone Encounter (Signed)
Entresto 97-103 mg BID PA approved by [employer] through 02/21/19.   Ruta Hinds. Velva Harman, PharmD, BCPS, CPP Clinical Pharmacist Phone: [phone] 02/12/2018 2:00 PM

## 2018-02-22 ENCOUNTER — Telehealth: Payer: Self-pay

## 2018-02-22 ENCOUNTER — Ambulatory Visit (INDEPENDENT_AMBULATORY_CARE_PROVIDER_SITE_OTHER): Payer: PRIVATE HEALTH INSURANCE | Admitting: *Deleted

## 2018-02-22 DIAGNOSIS — I5022 Chronic systolic (congestive) heart failure: Secondary | ICD-10-CM

## 2018-02-22 DIAGNOSIS — Z95810 Presence of automatic (implantable) cardiac defibrillator: Secondary | ICD-10-CM

## 2018-02-22 DIAGNOSIS — I428 Other cardiomyopathies: Secondary | ICD-10-CM

## 2018-02-22 NOTE — Progress Notes (Signed)
EPIC Encounter for ICM Monitoring  Patient Name: [name] is a 67 y.o. female Date: 02/22/2018 Primary Care Physican: [name], MD Primary Cardiologist:Bensimhon Electrophysiologist: Allred Dry Weight:unknown Bi-V Pacing: 93%       Attempted call to patient and unable to reach.  Left detailed message regarding transmission.  Transmission reviewed.    Thoracic impedance normal.  Prescribed dosage: Furosemide 40 mg 1 tablet daily. Per Dr Bensimhon'soffice visit note,08/21/2017, she can continue to take extra 20 mg as needed.  Labs: 03/22/2017 Creatinine 1.19, BUN 12, Potassium 3.6, Sodium 139, EGFR 47-54 02/20/2017 Creatinine 1.24, BUN 27, Potassium 3.6, Sodium 138, EGFR 44-51 09/30/2016 Creatinine 1.21, BUN 17, Potassium 3.7, Sodium 139 01/04/2016 Creatinine 0.94, BUN 18, Potassium 3.7, Sodium 141  Recommendations: Left voice mail with ICM number and encouraged to call if experiencing any fluid symptoms.  Follow-up plan: ICM clinic phone appointment on 03/27/2018.    Copy of ICM check sent to Dr. [name].   3 month ICM trend: 02/22/2018    1 Year ICM trend:       [name], RN 02/22/2018 11:00 AM

## 2018-02-22 NOTE — Telephone Encounter (Signed)
Remote ICM transmission received.  Attempted call to patient and left detailed message per DPR regarding transmission and next ICM scheduled for 03/27/2018.  Advised to return call for any fluid symptoms or questions.    

## 2018-02-23 ENCOUNTER — Encounter: Payer: Self-pay | Admitting: Cardiology

## 2018-02-23 NOTE — Progress Notes (Signed)
Remote ICD transmission.   

## 2018-02-28 NOTE — Progress Notes (Deleted)
Office Visit Note  Patient: [name]             Date of Birth: April 15, 1951           MRN: [id number]             PCP: [name], MD Referring: [name], MD Visit Date: 03/14/2018 Occupation: @GUAROCC @    Subjective:  No chief complaint on file.   History of Present Illness: [name] is a 67 y.o. female ***   Activities of Daily Living:  Patient reports morning stiffness for *** {minute/hour:19697}.   Patient {ACTIONS;DENIES/REPORTS:21021675::"Denies"} nocturnal pain.  Difficulty dressing/grooming: {ACTIONS;DENIES/REPORTS:21021675::"Denies"} Difficulty climbing stairs: {ACTIONS;DENIES/REPORTS:21021675::"Denies"} Difficulty getting out of chair: {ACTIONS;DENIES/REPORTS:21021675::"Denies"} Difficulty using hands for taps, buttons, cutlery, and/or writing: {ACTIONS;DENIES/REPORTS:21021675::"Denies"}   No Rheumatology ROS completed.   PMFS History:  Patient Active Problem List   Diagnosis Date Noted  . Encounter for long-term current use of medication 10/05/2016  . Acute pain of right shoulder 10/05/2016  . Stage 2 chronic kidney disease 10/05/2016  . Malignant neoplasm of right female breast ([place]) 10/05/2016  . Glaucoma of both eyes 10/05/2016  . Anxiety 10/05/2016  . Renal calcinosis 10/05/2016  . Dyslipidemia 10/05/2016  . Osteoarthritis of both knees 10/03/2016  . Idiopathic chronic gout, unspecified site, without tophus (tophi) 10/03/2016  . Unilateral primary osteoarthritis, right hip 10/03/2016  . Hypotension 09/26/2012  . ICD-St.Jude 06/21/2012  . Non-ischemic cardiomyopathy ([place]) 03/27/2012  . A-fib ([place]) 09/20/2011  . Chronic systolic heart failure ([place])   . OBSTRUCTIVE SLEEP APNEA 10/28/2010  . Secondary cardiomyopathy ([place]) 10/19/2010  . DVT 10/19/2010  . LBBB (left bundle branch block) 08/18/2010  . Morbid (severe) obesity due to excess calories ([place]) 08/17/2010  . DEPRESSION 08/17/2010  . OTHER CHRONIC PAIN 08/17/2010  .  GLAUCOMA 08/17/2010  . Essential hypertension 08/17/2010  . Dissection of aorta ([place]) 08/17/2010  . SHINGLES, HX OF 08/17/2010    Past Medical History:  Diagnosis Date  . Aortic dissection ([place]) 12/2008   Type B  . Arthritis    lower back  . Atrial fibrillation ([place])    post op (left thoracotomy) 11/12 req amio/DCCV  . Breast cancer ([place]) 07/2009   Stage 3 lumpectomy, radiation, chemotherapy; finished chemo October, felt to be in remission  . Cardiac defibrillator in place    St. Jude (JA) 9/12; LV lead placement unsuccessful;  s/p left thoracotomy with placement of epicardial LV lead 09/2011 (Dr. PVT)  . CHF (congestive heart failure) ([place])   . Chronic systolic dysfunction of left ventricle   . Depression   . DVT (deep venous thrombosis) ([place]) 10/18/2010  . GERD (gastroesophageal reflux disease)   . Glaucoma   . Hiatal hernia    small hiatal hernia  . History of shingles   . Hyperlipidemia   . Hypertension   . Left bundle branch block   . Nonischemic cardiomyopathy ([place])    Admission 12/11 with EF 25%, normal coronary arteries by cath 12/11; NICM presumed to be secondary to chemotherapy for breast cancer  . Obesity   . [name]    with exertion    Family History  Problem Relation Age of Onset  . Hypertension Mother   . Hypertension Father   . Hypertension Brother   . Hypertension Maternal Grandfather   . Heart attack Maternal Grandfather        MI  . Coronary artery disease Maternal Grandfather   . Hypertension Paternal Grandfather   . Heart  attack Paternal Grandfather        MI  . Coronary artery disease Paternal Grandfather   . Hypertension Brother    Past Surgical History:  Procedure Laterality Date  . BREAST LUMPECTOMY     Right breast  . CARDIAC CATHETERIZATION     2011  . CARDIAC DEFIBRILLATOR PLACEMENT    . EP IMPLANTABLE DEVICE N/A 01/12/2016   STJ ICD Unify Assura gen change, Dr. [name]  . OOPHORECTOMY     Single  . THORACOTOMY   09/16/2011   Procedure: THORACOTOMY MAJOR;  Surgeon: Tharon Aquas Adelene Idler, MD;  Location: [facility] OR;  Service: Thoracic;  Laterality: Left;  left anterolateral Thoracotomy for placement of St. Jude epicardial pacing lead   . TONSILLECTOMY    . TUBAL LIGATION     Bilateral   Social History   Social History Narrative   Married   One child   Lives in [place] with spouse and mother in law   Previously worked as a Art gallery manager for [employer]      She is a Leisure centre manager counsel member.     Objective: Vital Signs: There were no vitals taken for this visit.   Physical Exam   Musculoskeletal Exam: ***  CDAI Exam: No CDAI exam completed.    Investigation: No additional findings.  Imaging: No results found.  Speciality Comments: No specialty comments available.    Procedures:  No procedures performed Allergies: Alphagan [brimonidine]; Mobic [meloxicam]; Nsaids; Polyvinyl alcohol; Statins; Tolmetin; Travatan z [travoprost (bak free)]; and Tape   Assessment / Plan:     Visit Diagnoses: No diagnosis found.    Orders: No orders of the defined types were placed in this encounter.  No orders of the defined types were placed in this encounter.   Face-to-face time spent with patient was *** minutes. 50% of time was spent in counseling and coordination of care.  Follow-Up Instructions: No follow-ups on file.   Earnestine Mealing, CMA  Note - This record has been created using Editor, commissioning.  Chart creation errors have been sought, but may not always  have been located. Such creation errors do not reflect on  the standard of medical care.

## 2018-03-14 ENCOUNTER — Ambulatory Visit: Payer: PRIVATE HEALTH INSURANCE | Admitting: Rheumatology

## 2018-03-14 NOTE — Progress Notes (Deleted)
Office Visit Note  Patient: [name]             Date of Birth: 1951/02/27           MRN: [id number]             PCP: [name], MD Referring: [name], MD Visit Date: 03/28/2018 Occupation: @GUAROCC @    Subjective:  No chief complaint on file.   History of Present Illness: [name] is a 67 y.o. female ***   Activities of Daily Living:  Patient reports morning stiffness for *** {minute/hour:19697}.   Patient {ACTIONS;DENIES/REPORTS:21021675::"Denies"} nocturnal pain.  Difficulty dressing/grooming: {ACTIONS;DENIES/REPORTS:21021675::"Denies"} Difficulty climbing stairs: {ACTIONS;DENIES/REPORTS:21021675::"Denies"} Difficulty getting out of chair: {ACTIONS;DENIES/REPORTS:21021675::"Denies"} Difficulty using hands for taps, buttons, cutlery, and/or writing: {ACTIONS;DENIES/REPORTS:21021675::"Denies"}   No Rheumatology ROS completed.   PMFS History:  Patient Active Problem List   Diagnosis Date Noted  . Encounter for long-term current use of medication 10/05/2016  . Acute pain of right shoulder 10/05/2016  . Stage 2 chronic kidney disease 10/05/2016  . Malignant neoplasm of right female breast ([place]) 10/05/2016  . Glaucoma of both eyes 10/05/2016  . Anxiety 10/05/2016  . Renal calcinosis 10/05/2016  . Dyslipidemia 10/05/2016  . Osteoarthritis of both knees 10/03/2016  . Idiopathic chronic gout, unspecified site, without tophus (tophi) 10/03/2016  . Unilateral primary osteoarthritis, right hip 10/03/2016  . Hypotension 09/26/2012  . ICD-St.Jude 06/21/2012  . Non-ischemic cardiomyopathy ([place]) 03/27/2012  . A-fib ([place]) 09/20/2011  . Chronic systolic heart failure ([place])   . OBSTRUCTIVE SLEEP APNEA 10/28/2010  . Secondary cardiomyopathy ([place]) 10/19/2010  . DVT 10/19/2010  . LBBB (left bundle branch block) 08/18/2010  . Morbid (severe) obesity due to excess calories ([place]) 08/17/2010  . DEPRESSION 08/17/2010  . OTHER CHRONIC PAIN 08/17/2010  .  GLAUCOMA 08/17/2010  . Essential hypertension 08/17/2010  . Dissection of aorta ([place]) 08/17/2010  . SHINGLES, HX OF 08/17/2010    Past Medical History:  Diagnosis Date  . 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 Arthritis    lower back  . Atrial fibrillation ([place])    post op (left thoracotomy) 11/12 req amio/DCCV  . Breast cancer ([place]) 07/2009   Stage 3 lumpectomy, radiation, chemotherapy; finished chemo October, felt to be in remission  . Cardiac defibrillator in place    St. Jude (JA) 9/12; LV lead placement unsuccessful;  s/p left thoracotomy with placement of epicardial LV lead 09/2011 (Dr. PVT)  . CHF (congestive heart failure) ([place])   . Chronic systolic dysfunction of left ventricle   . Depression   . DVT (deep venous thrombosis) ([place]) 10/18/2010  . GERD (gastroesophageal reflux disease)   . Glaucoma   . Hiatal hernia    small hiatal hernia  . History of shingles   . Hyperlipidemia   . Hypertension   . Left bundle branch block   . Nonischemic cardiomyopathy ([place])    Admission 12/11 with EF 25%, normal coronary arteries by cath 12/11; NICM presumed to be secondary to chemotherapy for breast cancer  . Obesity   . [name]    with exertion    Family History  Problem Relation Age of Onset  . Hypertension Mother   . Hypertension Father   . Hypertension Brother   . Hypertension Maternal Grandfather   . Heart attack Maternal Grandfather        MI  . Coronary artery disease Maternal Grandfather   . Hypertension Paternal Grandfather   . Heart  attack Paternal Grandfather        MI  . Coronary artery disease Paternal Grandfather   . Hypertension Brother    Past Surgical History:  Procedure Laterality Date  . BREAST LUMPECTOMY     Right breast  . CARDIAC CATHETERIZATION     2011  . CARDIAC DEFIBRILLATOR PLACEMENT    . EP IMPLANTABLE DEVICE N/A 01/12/2016   STJ ICD Unify Assura gen change, Dr. [name]  . OOPHORECTOMY     Single  . THORACOTOMY   09/16/2011   Procedure: THORACOTOMY MAJOR;  Surgeon: Tharon Aquas Adelene Idler, MD;  Location: [facility];  Service: Thoracic;  Laterality: Left;  left anterolateral Thoracotomy for placement of St. Jude epicardial pacing lead   . TONSILLECTOMY    . TUBAL LIGATION     Bilateral   Social History   Social History Narrative   Married   One child   Lives in [place] with spouse and mother in law   Previously worked as a Art gallery manager for [employer]      She is a Leisure centre manager counsel member.     Objective: Vital Signs: There were no vitals taken for this visit.   Physical Exam   Musculoskeletal Exam: ***  CDAI Exam: No CDAI exam completed.    Investigation: No additional findings. CBC Latest Ref Rng & Units 01/04/2016 09/22/2011 09/21/2011  WBC 4.0 - 10.5 K/uL 7.4 5.5 6.7  Hemoglobin 12.0 - 15.0 g/dL 13.9 11.2(L) 10.9(L)  Hematocrit 36.0 - 46.0 % 40.8 34.1(L) 33.2(L)  Platelets 150 - 400 K/uL 154 142(L) 129(L)   CMP Latest Ref Rng & Units 03/22/2017 02/20/2017 09/30/2016  Glucose 65 - 99 mg/dL 91 88 84  BUN 6 - 20 mg/dL 12 27(H) 17  Creatinine 0.44 - 1.00 mg/dL 1.19(H) 1.24(H) 1.21(H)  Sodium 135 - 145 mmol/L 139 138 139  Potassium 3.5 - 5.1 mmol/L 3.6 3.6 3.7  Chloride 101 - 111 mmol/L 104 101 102  CO2 22 - 32 mmol/L 25 24 29   Calcium 8.9 - 10.3 mg/dL 9.4 9.5 9.4  Total Protein 6.0 - 8.3 g/dL - - -  Total Bilirubin 0.3 - 1.2 mg/dL - - -  Alkaline Phos 39 - 117 U/L - - -  AST 0 - 37 U/L - - -  ALT 0 - 35 U/L - - -    Imaging: No results found.  Speciality Comments: No specialty comments available.    Procedures:  No procedures performed Allergies: Alphagan [brimonidine]; Mobic [meloxicam]; Nsaids; Polyvinyl alcohol; Statins; Tolmetin; Travatan z [travoprost (bak free)]; and Tape   Assessment / Plan:     Visit Diagnoses: Idiopathic chronic gout of multiple sites without tophus - allopurinol 300 mg by mouth daily and colchicine 0.6 mg PRNUric acid: 5.3 on 09/19/17  Primary  osteoarthritis of both knees - not a surgical candidate per Cardiology   Primary osteoarthritis of right hip  Other chronic pain  Chronic systolic heart failure (HCC)  Non-ischemic cardiomyopathy ([place])  History of obesity  History of chronic kidney disease  Malignant neoplasm of right female breast, unspecified estrogen receptor status, unspecified site of breast ([place]) - Lumpectomy September 2010, chemotherapy and radiation therapy  Dyslipidemia  Renal calcinosis  History of glaucoma  History of anxiety  History of hypertension  History of sleep apnea  History of DVT (deep vein thrombosis)  History of aortic dissection    Orders: No orders of the defined types were placed in this encounter.  No orders of the  defined types were placed in this encounter.   Face-to-face time spent with patient was *** minutes. 50% of time was spent in counseling and coordination of care.  Follow-Up Instructions: No follow-ups on file.   Ofilia Neas, PA-C  Note - This record has been created using Dragon software.  Chart creation errors have been sought, but may not always  have been located. Such creation errors do not reflect on  the standard of medical care.

## 2018-03-16 LAB — CUP PACEART REMOTE DEVICE CHECK
Battery Remaining Longevity: 55 mo
Brady Statistic AP VP Percent: 6.4 %
Brady Statistic AP VS Percent: 1 %
Brady Statistic AS VP Percent: 86 %
Date Time Interrogation Session: 20190425060014
HIGH POWER IMPEDANCE MEASURED VALUE: 120 Ohm
HIGH POWER IMPEDANCE MEASURED VALUE: 120 Ohm
Implantable Lead Implant Date: 20120914
Implantable Lead Implant Date: 20120914
Implantable Lead Location: 753858
Implantable Lead Location: 753859
Implantable Lead Model: 511212
Lead Channel Impedance Value: 400 Ohm
Lead Channel Pacing Threshold Amplitude: 1.875 V
Lead Channel Pacing Threshold Pulse Width: 0.5 ms
Lead Channel Pacing Threshold Pulse Width: 0.7 ms
Lead Channel Sensing Intrinsic Amplitude: 0.6 mV
Lead Channel Setting Pacing Amplitude: 1.625
Lead Channel Setting Pacing Amplitude: 2 V
Lead Channel Setting Pacing Amplitude: 2.5 V
Lead Channel Setting Sensing Sensitivity: 0.5 mV
MDC IDC LEAD IMPLANT DT: 20121116
MDC IDC LEAD LOCATION: 753860
MDC IDC LEAD SERIAL: 194059
MDC IDC MSMT BATTERY REMAINING PERCENTAGE: 70 %
MDC IDC MSMT BATTERY VOLTAGE: 2.96 V
MDC IDC MSMT LEADCHNL LV IMPEDANCE VALUE: 280 Ohm
MDC IDC MSMT LEADCHNL LV PACING THRESHOLD PULSEWIDTH: 0.5 ms
MDC IDC MSMT LEADCHNL RA IMPEDANCE VALUE: 380 Ohm
MDC IDC MSMT LEADCHNL RA PACING THRESHOLD AMPLITUDE: 0.625 V
MDC IDC MSMT LEADCHNL RV PACING THRESHOLD AMPLITUDE: 1 V
MDC IDC MSMT LEADCHNL RV SENSING INTR AMPL: 12 mV
MDC IDC PG IMPLANT DT: 20170314
MDC IDC SET LEADCHNL LV PACING PULSEWIDTH: 0.5 ms
MDC IDC SET LEADCHNL RV PACING PULSEWIDTH: 0.7 ms
MDC IDC STAT BRADY AS VS PERCENT: 4.6 %
MDC IDC STAT BRADY RA PERCENT PACED: 4.8 %
Pulse Gen Serial Number: 7328650

## 2018-03-27 ENCOUNTER — Telehealth: Payer: Self-pay

## 2018-03-27 ENCOUNTER — Ambulatory Visit (INDEPENDENT_AMBULATORY_CARE_PROVIDER_SITE_OTHER): Payer: PRIVATE HEALTH INSURANCE

## 2018-03-27 DIAGNOSIS — Z95810 Presence of automatic (implantable) cardiac defibrillator: Secondary | ICD-10-CM | POA: Diagnosis not present

## 2018-03-27 DIAGNOSIS — I5022 Chronic systolic (congestive) heart failure: Secondary | ICD-10-CM | POA: Diagnosis not present

## 2018-03-27 NOTE — Progress Notes (Signed)
EPIC Encounter for ICM Monitoring  Patient Name: [name] is a 67 y.o. female Date: 03/27/2018 Primary Care Physican: [name], MD Primary Cardiologist:Bensimhon Electrophysiologist: Allred Dry Weight:unknown Bi-V Pacing: 93%       Attempted call to patient and unable to reach.  Left detailed message, per DPR, regarding transmission.  Transmission reviewed.    Thoracic impedance normal.  Prescribed dosage: Furosemide 40 mg 1 tablet daily. Per Dr Bensimhon'soffice visit note,08/21/2017, she can continue to take extra 20 mg as needed.  Labs: 03/22/2017 Creatinine 1.19, BUN 12, Potassium 3.6, Sodium 139, EGFR 47-54 02/20/2017 Creatinine 1.24, BUN 27, Potassium 3.6, Sodium 138, EGFR 44-51 09/30/2016 Creatinine 1.21, BUN 17, Potassium 3.7, Sodium 139  Recommendations: Left voice mail with ICM number and encouraged to call if experiencing any fluid symptoms.  Follow-up plan: ICM clinic phone appointment on 04/27/2018.  Office appointment scheduled 04/19/2018 with Dr. Haroldine Laws.  Copy of ICM check sent to Dr. [name].   3 month ICM trend: 03/27/2018    1 Year ICM trend:       [name], RN 03/27/2018 11:27 AM

## 2018-03-27 NOTE — Telephone Encounter (Signed)
Remote ICM transmission received.  Attempted call to patient and left detailed message, per DPR, regarding transmission and next ICM scheduled for 04/27/2018.  Advised to return call for any fluid symptoms or questions.

## 2018-03-28 ENCOUNTER — Ambulatory Visit: Payer: PRIVATE HEALTH INSURANCE | Admitting: Physician Assistant

## 2018-03-28 NOTE — Progress Notes (Signed)
Office Visit Note  Patient: [name]             Date of Birth: Oct 24, 1951           MRN: [id number]             PCP: [name], MD Referring: [name], MD Visit Date: 04/04/2018 Occupation: @GUAROCC @    Subjective:  Bilateral knee pain   History of Present Illness: [name] is a 67 y.o. female with history of gout and osteoarthritis.  Patient takes allopurinol 300 mg daily and colchicine 0.6 mg as needed.  She denies any gout flares.  She states that she continues to have significant discomfort in bilateral knees.  She states that occasionally she will have swelling so she is she is standing for prolonged period of time.  She states she has been having to walk with a cane and have assistance when getting up from chairs.  She states that she uses Voltaren gel on a daily basis and will occasionally ice her knees.  She would like another round of Euflexxa injections for bilateral knees.  She states that a time she will be woken up at night due to the pain especially if she is having muscle spasms in her legs.  She will take Flexeril for muscle spasms which helps resolve her symptoms.  She states she is also having discomfort in her right shoulder and injured it several days ago reaching back in the car.  She states that she has been taking tramadol for pain relief.     Activities of Daily Living:  Patient reports morning stiffness for 20-30  minutes.   Patient Reports nocturnal pain.  Difficulty dressing/grooming: Denies Difficulty climbing stairs: Reports Difficulty getting out of chair: Reports Difficulty using hands for taps, buttons, cutlery, and/or writing: Denies   Review of Systems  Constitutional: Positive for fatigue.  HENT: Positive for mouth dryness. Negative for mouth sores and nose dryness.   Eyes: Positive for dryness. Negative for pain and visual disturbance.  Respiratory: Negative for cough, hemoptysis, shortness of breath and difficulty  breathing.   Cardiovascular: Negative for chest pain, palpitations, hypertension and swelling in legs/feet.  Gastrointestinal: Negative for blood in stool, constipation and diarrhea.  Endocrine: Negative for increased urination.  Genitourinary: Negative for painful urination.  Musculoskeletal: Positive for arthralgias, joint pain, joint swelling and morning stiffness. Negative for myalgias, muscle weakness, muscle tenderness and myalgias.  Skin: Negative for color change, pallor, rash, hair loss, nodules/bumps, skin tightness, ulcers and sensitivity to sunlight.  Allergic/Immunologic: Negative for susceptible to infections.  Neurological: Negative for dizziness, numbness, headaches and weakness.  Hematological: Negative for swollen glands.  Psychiatric/Behavioral: Positive for depressed mood and sleep disturbance. The patient is nervous/anxious.     PMFS History:  Patient Active Problem List   Diagnosis Date Noted  . Encounter for long-term current use of medication 10/05/2016  . Acute pain of right shoulder 10/05/2016  . Stage 2 chronic kidney disease 10/05/2016  . Malignant neoplasm of right female breast ([place]) 10/05/2016  . Glaucoma of both eyes 10/05/2016  . Anxiety 10/05/2016  . Renal calcinosis 10/05/2016  . Dyslipidemia 10/05/2016  . Osteoarthritis of both knees 10/03/2016  . Idiopathic chronic gout, unspecified site, without tophus (tophi) 10/03/2016  . Unilateral primary osteoarthritis, right hip 10/03/2016  . Hypotension 09/26/2012  . ICD-St.Jude 06/21/2012  . Non-ischemic cardiomyopathy ([place]) 03/27/2012  . A-fib ([place]) 09/20/2011  . Chronic systolic heart failure ([place])   .  OBSTRUCTIVE SLEEP APNEA 10/28/2010  . Secondary cardiomyopathy ([place]) 10/19/2010  . DVT 10/19/2010  . LBBB (left bundle branch block) 08/18/2010  . Morbid (severe) obesity due to excess calories ([place]) 08/17/2010  . DEPRESSION 08/17/2010  . OTHER CHRONIC PAIN 08/17/2010  . GLAUCOMA 08/17/2010  .  Essential hypertension 08/17/2010  . Dissection of aorta ([place]) 08/17/2010  . SHINGLES, HX OF 08/17/2010    Past Medical History:  Diagnosis Date  . 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 Arthritis    lower back  . Atrial fibrillation ([place])    post op (left thoracotomy) 11/12 req amio/DCCV  . Breast cancer ([place]) 07/2009   Stage 3 lumpectomy, radiation, chemotherapy; finished chemo October, felt to be in remission  . Cardiac defibrillator in place    St. Jude (JA) 9/12; LV lead placement unsuccessful;  s/p left thoracotomy with placement of epicardial LV lead 09/2011 (Dr. PVT)  . CHF (congestive heart failure) ([place])   . Chronic systolic dysfunction of left ventricle   . Depression   . DVT (deep venous thrombosis) ([place]) 10/18/2010  . GERD (gastroesophageal reflux disease)   . Glaucoma   . Hiatal hernia    small hiatal hernia  . History of shingles   . Hyperlipidemia   . Hypertension   . Left bundle branch block   . Nonischemic cardiomyopathy ([place])    Admission 12/11 with EF 25%, normal coronary arteries by cath 12/11; NICM presumed to be secondary to chemotherapy for breast cancer  . Obesity   . [name]    with exertion    Family History  Problem Relation Age of Onset  . Hypertension Mother   . Glaucoma Mother   . Atrial fibrillation Mother   . Hypertension Father   . Hypertension Brother   . Hypertension Maternal Grandfather   . Heart attack Maternal Grandfather        MI  . Coronary artery disease Maternal Grandfather   . Hypertension Paternal Grandfather   . Heart attack Paternal Grandfather        MI  . Coronary artery disease Paternal Grandfather   . Hypertension Brother    Past Surgical History:  Procedure Laterality Date  . BREAST LUMPECTOMY     Right breast  . CARDIAC CATHETERIZATION     2011  . CARDIAC DEFIBRILLATOR PLACEMENT    . EP IMPLANTABLE DEVICE N/A 01/12/2016   STJ ICD Unify Assura gen change, Dr. [name]  . OOPHORECTOMY      Single  . THORACOTOMY  09/16/2011   Procedure: THORACOTOMY MAJOR;  Surgeon: Tharon Aquas Adelene Idler, MD;  Location[facility] OR;  Service: Thoracic;  Laterality: Left;  left anterolateral Thoracotomy for placement of St. Jude epicardial pacing lead   . TONSILLECTOMY    . TUBAL LIGATION     Bilateral   Social History   Social History Narrative   Married   One child   Lives in [place] with spouse and mother in law   Previously worked as a Art gallery manager for [employer]      She is a Leisure centre manager counsel member.     Objective: Vital Signs: BP 101/69 (BP Location: Left Arm, Patient Position: Sitting, Cuff Size: Small)   Pulse 81   Resp 12   Ht 5' 2.5" (1.588 m)   Wt 242 lb (109.8 kg)   BMI 43.56 kg/m    Physical Exam  Constitutional: She is oriented to person, place, and time. She appears well-developed  and well-nourished.  HENT:  Head: Normocephalic and atraumatic.  Eyes: Conjunctivae and EOM are normal.  Neck: Normal range of motion.  Cardiovascular: Normal rate, regular rhythm, normal heart sounds and intact distal pulses.  Pulmonary/Chest: Effort normal and breath sounds normal.  Abdominal: Soft. Bowel sounds are normal.  Lymphadenopathy:    She has no cervical adenopathy.  Neurological: She is alert and oriented to person, place, and time.  Skin: Skin is warm and dry. Capillary refill takes less than 2 seconds.  Psychiatric: She has a normal mood and affect. Her behavior is normal.  Nursing note and vitals reviewed.    Musculoskeletal Exam: C-spine good range of motion.  She has slightly limited range of motion of thoracic and lumbar spine.  She has midline spinal tenderness in the lumbar region.  Shoulder joints, elbow joints, wrist joints, MCPs, PIPs, DIPs good range of motion with no synovitis.  She has limited range of motion of her right hip with discomfort.  She has discomfort with range of motion of bilateral knees.  No warmth or effusion was noted.  She has pedal edema  bilaterally.  No tenderness of trochanteric bursa.  CDAI Exam: No CDAI exam completed.    Investigation: No additional findings. CBC Latest Ref Rng & Units 01/04/2016 09/22/2011 09/21/2011  WBC 4.0 - 10.5 K/uL 7.4 5.5 6.7  Hemoglobin 12.0 - 15.0 g/dL 13.9 11.2(L) 10.9(L)  Hematocrit 36.0 - 46.0 % 40.8 34.1(L) 33.2(L)  Platelets 150 - 400 K/uL 154 142(L) 129(L)   CMP Latest Ref Rng & Units 03/22/2017 02/20/2017 09/30/2016  Glucose 65 - 99 mg/dL 91 88 84  BUN 6 - 20 mg/dL 12 27(H) 17  Creatinine 0.44 - 1.00 mg/dL 1.19(H) 1.24(H) 1.21(H)  Sodium 135 - 145 mmol/L 139 138 139  Potassium 3.5 - 5.1 mmol/L 3.6 3.6 3.7  Chloride 101 - 111 mmol/L 104 101 102  CO2 22 - 32 mmol/L 25 24 29   Calcium 8.9 - 10.3 mg/dL 9.4 9.5 9.4  Total Protein 6.0 - 8.3 g/dL - - -  Total Bilirubin 0.3 - 1.2 mg/dL - - -  Alkaline Phos 39 - 117 U/L - - -  AST 0 - 37 U/L - - -  ALT 0 - 35 U/L - - -     Imaging: No results found.  Speciality Comments: No specialty comments available.    Procedures:  No procedures performed Allergies: Alphagan [brimonidine]; Mobic [meloxicam]; Nsaids; Polyvinyl alcohol; Statins; Tolmetin; Travatan z [travoprost (bak free)]; and Tape   Assessment / Plan:     Visit Diagnoses: Idiopathic chronic gout of multiple sites without tophus -she has not had any recent gout flares.  She takes allopurinol 300 mg daily and colchicine 0.6 mg as needed.  She has not had to take colchicine in a very long time.  A refill of allopurinol and colchicine were sent to the pharmacy today.  We will check uric acid level, CBC and, CMP today. - Plan: Uric acid  Medication monitoring encounter -CBC and CMP will be drawn today to monitor for drug toxicity.  Plan: CBC with Differential/Platelet, COMPLETE METABOLIC PANEL WITH GFR   Primary osteoarthritis of both knees - Not a surgical candidate per cardiology.  She is been having significant discomfort in bilateral knees.  No warmth or effusion was noted  today.  She has been walking with a cane and having to have assistance getting out of car going upstairs.  We placed in urgent request for bilateral Euflexxa injections.  She  was also given a refill of Voltaren gel which she can apply topically.  Primary osteoarthritis of right hip: She has limited range of motion with discomfort of her right hip.  She cannot have cortisone injections due to her cardiovascular health.  She is been walking with a cane.  Chronic systolic heart failure (HCC)  Non-ischemic cardiomyopathy (HCC)  History of chronic kidney disease  Malignant neoplasm of right female breast, unspecified estrogen receptor status, unspecified site of breast ([place])  Dyslipidemia  Renal calcinosis  History of glaucoma  History of anxiety  History of hypertension  History of DVT (deep vein thrombosis)  History of aortic dissection     Orders: Orders Placed This Encounter  Procedures  . CBC with Differential/Platelet  . COMPLETE METABOLIC PANEL WITH GFR  . Uric acid   Meds ordered this encounter  Medications  . diclofenac sodium (VOLTAREN) 1 % GEL    Sig: APPLY THREE GRAMS TO THREE LARGE JOINTS UP TO THREE TIMES DAILY AS NEEDED    Dispense:  3 Tube    Refill:  3  . colchicine 0.6 MG tablet    Sig: Take 1 tablet (0.6 mg total) by mouth daily as needed.    Dispense:  30 tablet    Refill:  1  . allopurinol (ZYLOPRIM) 300 MG tablet    Sig: Take 1 tablet (300 mg total) by mouth daily.    Dispense:  90 tablet    Refill:  1    Face-to-face time spent with patient was 30 minutes. >50% of time was spent in counseling and coordination of care.  Follow-Up Instructions: Return in about 6 months (around 10/04/2018) for Gout, Osteoarthritis.   Ofilia Neas, PA-C  Note - This record has been created using Dragon software.  Chart creation errors have been sought, but may not always  have been located. Such creation errors do not reflect on  the standard of medical  care.

## 2018-04-04 ENCOUNTER — Encounter: Payer: Self-pay | Admitting: Physician Assistant

## 2018-04-04 ENCOUNTER — Ambulatory Visit: Payer: PRIVATE HEALTH INSURANCE | Admitting: Physician Assistant

## 2018-04-04 VITALS — BP 101/69 | HR 81 | Resp 12 | Ht 62.5 in | Wt 242.0 lb

## 2018-04-04 DIAGNOSIS — Z8679 Personal history of other diseases of the circulatory system: Secondary | ICD-10-CM

## 2018-04-04 DIAGNOSIS — Z8659 Personal history of other mental and behavioral disorders: Secondary | ICD-10-CM | POA: Diagnosis not present

## 2018-04-04 DIAGNOSIS — M1611 Unilateral primary osteoarthritis, right hip: Secondary | ICD-10-CM | POA: Diagnosis not present

## 2018-04-04 DIAGNOSIS — Z8669 Personal history of other diseases of the nervous system and sense organs: Secondary | ICD-10-CM | POA: Diagnosis not present

## 2018-04-04 DIAGNOSIS — E785 Hyperlipidemia, unspecified: Secondary | ICD-10-CM

## 2018-04-04 DIAGNOSIS — N29 Other disorders of kidney and ureter in diseases classified elsewhere: Secondary | ICD-10-CM

## 2018-04-04 DIAGNOSIS — I5022 Chronic systolic (congestive) heart failure: Secondary | ICD-10-CM

## 2018-04-04 DIAGNOSIS — M1A09X0 Idiopathic chronic gout, multiple sites, without tophus (tophi): Secondary | ICD-10-CM

## 2018-04-04 DIAGNOSIS — I428 Other cardiomyopathies: Secondary | ICD-10-CM

## 2018-04-04 DIAGNOSIS — M170 Bilateral primary osteoarthritis of knee: Secondary | ICD-10-CM | POA: Diagnosis not present

## 2018-04-04 DIAGNOSIS — Z87448 Personal history of other diseases of urinary system: Secondary | ICD-10-CM | POA: Diagnosis not present

## 2018-04-04 DIAGNOSIS — Z5181 Encounter for therapeutic drug level monitoring: Secondary | ICD-10-CM

## 2018-04-04 DIAGNOSIS — C50911 Malignant neoplasm of unspecified site of right female breast: Secondary | ICD-10-CM | POA: Diagnosis not present

## 2018-04-04 DIAGNOSIS — Z86718 Personal history of other venous thrombosis and embolism: Secondary | ICD-10-CM

## 2018-04-04 MED ORDER — COLCHICINE 0.6 MG PO TABS: 0.6000 mg | ORAL_TABLET | Freq: Every day | ORAL | 1 refills | Status: DC | PRN

## 2018-04-04 MED ORDER — ALLOPURINOL 300 MG PO TABS: 300.0000 mg | ORAL_TABLET | Freq: Every day | ORAL | 1 refills | Status: DC

## 2018-04-04 MED ORDER — DICLOFENAC SODIUM 1 % TD GEL: TRANSDERMAL | 3 refills | Status: DC

## 2018-04-05 ENCOUNTER — Telehealth (INDEPENDENT_AMBULATORY_CARE_PROVIDER_SITE_OTHER): Payer: Self-pay

## 2018-04-05 LAB — CBC WITH DIFFERENTIAL/PLATELET
Basophils Absolute: 60 cells/uL (ref 0–200)
Basophils Relative: 0.7 %
EOS PCT: 1.7 %
Eosinophils Absolute: 146 cells/uL (ref 15–500)
HCT: 41.2 % (ref 35.0–45.0)
HEMOGLOBIN: 14.3 g/dL (ref 11.7–15.5)
LYMPHS ABS: 1505 {cells}/uL (ref 850–3900)
MCH: 30.7 pg (ref 27.0–33.0)
MCHC: 34.7 g/dL (ref 32.0–36.0)
MCV: 88.4 fL (ref 80.0–100.0)
MONOS PCT: 6.4 %
MPV: 12.7 fL — ABNORMAL HIGH (ref 7.5–12.5)
NEUTROS ABS: 6338 {cells}/uL (ref 1500–7800)
Neutrophils Relative %: 73.7 %
Platelets: 158 10*3/uL (ref 140–400)
RBC: 4.66 10*6/uL (ref 3.80–5.10)
RDW: 13.6 % (ref 11.0–15.0)
Total Lymphocyte: 17.5 %
WBC mixed population: 550 cells/uL (ref 200–950)
WBC: 8.6 10*3/uL (ref 3.8–10.8)

## 2018-04-05 LAB — COMPLETE METABOLIC PANEL WITH GFR
AG Ratio: 1.6 (calc) (ref 1.0–2.5)
ALBUMIN MSPROF: 4 g/dL (ref 3.6–5.1)
ALT: 15 U/L (ref 6–29)
AST: 24 U/L (ref 10–35)
Alkaline phosphatase (APISO): 110 U/L (ref 33–130)
BUN/Creatinine Ratio: 16 (calc) (ref 6–22)
BUN: 20 mg/dL (ref 7–25)
CALCIUM: 9.3 mg/dL (ref 8.6–10.4)
CO2: 28 mmol/L (ref 20–32)
CREATININE: 1.26 mg/dL — AB (ref 0.50–0.99)
Chloride: 102 mmol/L (ref 98–110)
GFR, EST NON AFRICAN AMERICAN: 44 mL/min/{1.73_m2} — AB (ref >=60)
GFR, Est African American: 51 mL/min/{1.73_m2} — ABNORMAL LOW (ref >=60)
GLOBULIN: 2.5 g/dL (ref 1.9–3.7)
GLUCOSE: 99 mg/dL (ref 65–99)
Potassium: 4.4 mmol/L (ref 3.5–5.3)
SODIUM: 139 mmol/L (ref 135–146)
Total Bilirubin: 0.6 mg/dL (ref 0.2–1.2)
Total Protein: 6.5 g/dL (ref 6.1–8.1)

## 2018-04-05 LAB — URIC ACID: Uric Acid, Serum: 4.6 mg/dL (ref 2.5–7.0)

## 2018-04-05 NOTE — Telephone Encounter (Signed)
Submitted application online for Euflexxa injection series, bilateral knee. 

## 2018-04-05 NOTE — Progress Notes (Signed)
CBC stable.  Creatinine and GFR are stable.  Please advise her to avoid NSAIDs. Uric acid within desirable range.

## 2018-04-06 ENCOUNTER — Telehealth (INDEPENDENT_AMBULATORY_CARE_PROVIDER_SITE_OTHER): Payer: Self-pay

## 2018-04-06 NOTE — Telephone Encounter (Signed)
Received VOB for Euflexxa, bilateral knee, stating that medication has to be obtained through specialty pharmacy.   Talked with Iona Beard with [facility] to investigate pharmacy benefits.

## 2018-04-11 ENCOUNTER — Telehealth: Payer: Self-pay | Admitting: *Deleted

## 2018-04-11 NOTE — Telephone Encounter (Signed)
Prior authorization request received for Colchicine. PA submitted on cover my meds. Will update once decision has been reached.

## 2018-04-12 ENCOUNTER — Telehealth: Payer: Self-pay | Admitting: Radiology

## 2018-04-12 NOTE — Telephone Encounter (Signed)
Spoke with pt to notify of approval on colchicine tab 0.6 mg through 04/12/2019 and for pt to call pharmacy to let them know it has been approved and can be filled. Sent papers to scanning center.

## 2018-04-17 ENCOUNTER — Telehealth (INDEPENDENT_AMBULATORY_CARE_PROVIDER_SITE_OTHER): Payer: Self-pay

## 2018-04-17 NOTE — Telephone Encounter (Signed)
Talked with patient and advised her that Euflexxa series for bilateral knee is not covered through her pharmacy benefits.  Stated that she would give [place] a call back late this afternoon or in the morning concerning injection.

## 2018-04-18 ENCOUNTER — Encounter (HOSPITAL_COMMUNITY): Payer: PRIVATE HEALTH INSURANCE | Admitting: Internal Medicine

## 2018-04-19 ENCOUNTER — Ambulatory Visit (HOSPITAL_COMMUNITY)
Admission: RE | Admit: 2018-04-19 | Discharge: 2018-04-19 | Disposition: A | Discharge status: Home / Self Care | Payer: PRIVATE HEALTH INSURANCE | Source: Ambulatory Visit | Attending: Internal Medicine | Admitting: Internal Medicine

## 2018-04-19 VITALS — BP 102/70 | HR 95 | Wt 238.8 lb

## 2018-04-19 DIAGNOSIS — Z79899 Other long term (current) drug therapy: Secondary | ICD-10-CM | POA: Insufficient documentation

## 2018-04-19 DIAGNOSIS — E669 Obesity, unspecified: Secondary | ICD-10-CM | POA: Insufficient documentation

## 2018-04-19 DIAGNOSIS — Z79891 Long term (current) use of opiate analgesic: Secondary | ICD-10-CM | POA: Diagnosis not present

## 2018-04-19 DIAGNOSIS — I480 Paroxysmal atrial fibrillation: Secondary | ICD-10-CM | POA: Diagnosis not present

## 2018-04-19 DIAGNOSIS — I5022 Chronic systolic (congestive) heart failure: Secondary | ICD-10-CM | POA: Diagnosis not present

## 2018-04-19 DIAGNOSIS — Z95810 Presence of automatic (implantable) cardiac defibrillator: Secondary | ICD-10-CM | POA: Diagnosis not present

## 2018-04-19 DIAGNOSIS — Z9889 Other specified postprocedural states: Secondary | ICD-10-CM | POA: Insufficient documentation

## 2018-04-19 DIAGNOSIS — Z7982 Long term (current) use of aspirin: Secondary | ICD-10-CM | POA: Insufficient documentation

## 2018-04-19 DIAGNOSIS — Z853 Personal history of malignant neoplasm of breast: Secondary | ICD-10-CM | POA: Diagnosis not present

## 2018-04-19 DIAGNOSIS — I110 Hypertensive heart disease with heart failure: Secondary | ICD-10-CM | POA: Insufficient documentation

## 2018-04-19 DIAGNOSIS — Z9221 Personal history of antineoplastic chemotherapy: Secondary | ICD-10-CM | POA: Insufficient documentation

## 2018-04-19 DIAGNOSIS — I10 Essential (primary) hypertension: Secondary | ICD-10-CM

## 2018-04-19 DIAGNOSIS — G4733 Obstructive sleep apnea (adult) (pediatric): Secondary | ICD-10-CM | POA: Diagnosis not present

## 2018-04-19 DIAGNOSIS — Z923 Personal history of irradiation: Secondary | ICD-10-CM | POA: Insufficient documentation

## 2018-04-19 DIAGNOSIS — I429 Cardiomyopathy, unspecified: Secondary | ICD-10-CM | POA: Diagnosis not present

## 2018-04-19 DIAGNOSIS — F329 Major depressive disorder, single episode, unspecified: Secondary | ICD-10-CM | POA: Diagnosis not present

## 2018-04-19 DIAGNOSIS — I7101 Dissection of thoracic aorta: Secondary | ICD-10-CM

## 2018-04-19 DIAGNOSIS — I71019 Dissection of thoracic aorta, unspecified: Secondary | ICD-10-CM

## 2018-04-19 DIAGNOSIS — E785 Hyperlipidemia, unspecified: Secondary | ICD-10-CM | POA: Insufficient documentation

## 2018-04-19 MED ORDER — SACUBITRIL-VALSARTAN 49-51 MG PO TABS: 1.0000 | ORAL_TABLET | Freq: Two times a day (BID) | ORAL | 3 refills | Status: DC

## 2018-04-19 NOTE — Patient Instructions (Signed)
Decrease Entresto 49/51 mg (1 tab), twice a day  Your physician recommends that you schedule a follow-up appointment in: 2 months with Dr. Haroldine Laws

## 2018-04-19 NOTE — Progress Notes (Signed)
ADVANCED HF CLINIC NOTE   Patient ID: [name], female   DOB: 13-Aug-1951, 67 y.o.   MRN: [id number] Oncologist: Dr. Philipp Ovens at [facility] EP: Dr. [name] Dr. Bea Graff in [place] HF: Kj Imbert  HPI: [name] is a 67 y.o. female a cardiac history that includes HTN, Type B aortic dissection (2010) managed with medical therapy by Dr. Prescott Gum and has healed, LBBB, systolic heart failure due to NICM with EF 10-15% in May 2013 which has decreased from 65% in 2010.  Cath Dec 2011 showed normal arteries.  She was diagnosed with breast cancer in 2009 s/p lumpectomy, XRT, and chemotherapy.  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   She underwent St Jude Bi-V ICD implantation Sept 2012 but unable to place LV lead.  She eventually brought back in Nov 2012 for left thoracotomy and placement of epicardial LV lead placement by Dr. Prescott Gum.  She also had a sleep studythat showed mild OSA.  No clinical intervention occurred.    At last visit we decreased corlanor as HRs have been slow at times. Entresto increased.    She presents today for regular follow up. Last visit Entresto increased and lasix cut back. Weight up 3 lbs from then. Overall doing well but has had some "episodes" with her HR and BP. Checks her BP with a wrist band auto cuff. Since the end of May she has had episodes where her SBP drops into 80s, lowest has been in the upper 60s but not sure accurate. She is lightheaded on these days. She has been taking lasix M/W/F and as needed. When her BP drops, those have been days where she takes more lasix. She usually goes by her ankles and hands getting puffy to take extra lasix. Can do ADLs but gets SOB with more activity. No CP. Denies orthopnea or PND. + Bendopnea.   Echo 12/2017 LVEF 20% RV ok.  Corevue: Thoracic impedence above threshold consistent with low volume on exam. No VT/VF. No AT/AF.    10/10/2012 CPX Peak VO2:  10.6 % predicted peak VO2: 64.4% VE/VCO2 slope: 26.9 OUES: 1.18 Peak RER: 1.29 Ventilatory Threshold: 7.3 % predicted peak VO2: 44.4% Peak RR 23 Peak Ventilation: 29.8 VE/MVV: 32.6% PETCO2 at peak: 43  01/2014 CPX Peak VO2: 10.3 (66.4% predicted peak VO2) VE/VCO2 slope: 25.0 OUES: 1.30 Peak RER: 1.20  CPX (8/16): Peak VO2 10.1 VE/VCO2 slope 28.4 RER 1.14 OUES 1.3 PFTs mildly restrictive No significant change compared to prior.  Limited more by obesity and restrictive lung physiology than heart (mild circulatory limitation).   ECHO 01/02/13 EF 79% Grade 1 diastolic dysfunction. RV ok. No PAH ECHO 07/10/13 EF 20% RV ok ECHO 10/13/14 EF 15-20% RV ok Echo 10/2015 EF 15%  Echo 09/30/16 LVEF 10-15%, Grade 1 DD, mild PI, Mild TR. Normal RV.  Review of systems complete and found to be negative unless listed in HPI.    Past Medical History:  Diagnosis Date  . Aortic dissection ([place]) 12/2008   Type B  . Arthritis    lower back  . Atrial fibrillation ([place])    post op (left thoracotomy) 11/12 req amio/DCCV  . Breast cancer ([place]) 07/2009   Stage 3 lumpectomy, radiation, chemotherapy; finished chemo October, felt to be in remission  . Cardiac defibrillator in place    St. Jude (JA) 9/12; LV lead placement unsuccessful;  s/p left thoracotomy with placement of epicardial LV lead 09/2011 (Dr. PVT)  .  CHF (congestive heart failure) ([place])   . Chronic systolic dysfunction of left ventricle   . Depression   . DVT (deep venous thrombosis) ([place]) 10/18/2010  . GERD (gastroesophageal reflux disease)   . Glaucoma   . Hiatal hernia    small hiatal hernia  . History of shingles   . Hyperlipidemia   . Hypertension   . Left bundle branch block   . Nonischemic cardiomyopathy ([place])    Admission 12/11 with EF 25%, normal coronary arteries by cath 12/11; NICM presumed to be secondary to chemotherapy for breast cancer  . Obesity   . [name]    with exertion   Current Outpatient  Medications  Medication Sig Dispense Refill  . albuterol (PROVENTIL HFA;VENTOLIN HFA) 108 (90 BASE) MCG/ACT inhaler Inhale 2 puffs into the lungs every 4 (four) hours as needed. [name]     . albuterol (PROVENTIL) (2.5 MG/3ML) 0.083% nebulizer solution Take 2.5 mg by nebulization every 4 (four) hours as needed. [name]     . allopurinol (ZYLOPRIM) 300 MG tablet Take 1 tablet (300 mg total) by mouth daily. 90 tablet 1  . ALPRAZolam (XANAX) 1 MG tablet Take 0.5 mg by mouth daily as needed for anxiety. Anxiety     . aspirin 81 MG EC tablet Take 81 mg by mouth daily.      [name] azelastine (OPTIVAR) 0.05 % ophthalmic solution Place 1 drop into both eyes daily.    . Bepotastine Besilate (BEPREVE) 1.5 % SOLN Place 1 drop into both eyes daily as needed. Dry eyes     . bimatoprost (LUMIGAN) 0.03 % ophthalmic solution Place 1 drop into both eyes at bedtime. Reported on 01/12/2016    . Calcium Carbonate-Vitamin D (CALCIUM-VITAMIN D3) 600-200 MG-UNIT TABS Take 1 tablet by mouth 2 (two) times daily.      . carvedilol (COREG) 12.5 MG tablet Take 12.5 mg by mouth 2 (two) times daily with a meal.    . cholecalciferol (VITAMIN D) 1000 UNITS tablet Take 1,000 Units by mouth 2 (two) times daily.      . colchicine 0.6 MG tablet Take 1 tablet (0.6 mg total) by mouth daily as needed. 30 tablet 1  . cyclobenzaprine (FLEXERIL) 10 MG tablet Take 1 tablet (10 mg total) by mouth 3 (three) times daily as needed for muscle spasms. 30 tablet 0  . diclofenac sodium (VOLTAREN) 1 % GEL APPLY THREE GRAMS TO THREE LARGE JOINTS UP TO THREE TIMES DAILY AS NEEDED 3 Tube 3  . docusate sodium (COLACE) 100 MG capsule Take 100 mg by mouth 2 (two) times daily.    [name] ENTRESTO 97-103 MG Take 1 tablet by mouth 2 (two) times daily. 60 tablet 6  . fexofenadine (ALLEGRA) 180 MG tablet Take 180 mg by mouth daily as needed. Allergies     . fluticasone (FLONASE) 50 MCG/ACT nasal spray Place 2 sprays into the nose daily as  needed. Allergies     . furosemide (LASIX) 40 MG tablet Take 1 tablet (40 mg total) by mouth 3 (three) times a week. Monday, Wednesday and Friday and as needed 90 tablet 3  . HYDROcodone-acetaminophen (NORCO/VICODIN) 5-325 MG tablet TAKE ONE TABLET BY MOUTH EVERY 6 HOURS PRN    . ivabradine ([name]) 5 MG TABS tablet Take 1 tablet (5 mg total) by mouth 2 (two) times daily with a meal. 60 tablet 3  . letrozole (FEMARA) 2.5 MG tablet Take 2.5 mg by mouth every evening.      [name]  montelukast (SINGULAIR) 10 MG tablet Take 10 mg by mouth at bedtime. For allergies & congestion    . Multiple Vitamin (MULTIVITAMIN) tablet Take 1 tablet by mouth daily.      [name] 20 MG capsule Take 20 mg by mouth 2 (two) times daily.      [name] PARoxetine (PAXIL) 20 MG tablet Take 20 mg by mouth daily.  1  . Polyethyl Glycol-Propyl Glycol (SYSTANE OP) Apply 1 drop to eye daily as needed (dry eyes).     [name] senna-docusate (SENOKOT-S) 8.6-50 MG per tablet Take 1 tablet by mouth daily as needed for mild constipation.     [name] spironolactone (ALDACTONE) 25 MG tablet Take 1 tablet (25 mg total) by mouth daily. 90 tablet 3  . traMADol (ULTRAM) 50 MG tablet Take 50 mg by mouth every 6 (six) hours as needed for pain.     No current facility-administered medications for this encounter.    PHYSICAL EXAM: Vitals:   04/19/18 1426  BP: 102/70  Pulse: 95  SpO2: 98%  Weight: 238 lb 12.8 oz (108.3 kg)   Wt Readings from Last 3 Encounters:  04/19/18 238 lb 12.8 oz (108.3 kg)  04/04/18 242 lb (109.8 kg)  12/14/17 235 lb 12 oz (106.9 kg)   Physical Exam  General: Elderly appearing No resp difficulty. HEENT: Normal Neck: Supple. JVP flat Carotids 2+ bilat; no bruits. No thyromegaly or nodule noted. Cor: PMI laterally displaced. RRR, No M/G/R noted Lungs: CTAB, normal effort. No wheeze Abdomen: Obese. Soft, non-tender, non-distended, no HSM. No bruits or masses. +BS  Extremities: Cool. No cyanosis, clubbing, or rash.  R and LLE no edema.  Neuro: alert & oriented x 3, cranial nerves grossly intact. moves all 4 extremities w/o difficulty. Affect pleasant   ASSESSMENT & PLAN: 1. Chronic systolic CHF: Nonischemic cardiomyopathy.  Echo 09/30/16 LVEF 10-15%, Grade 1 DD, mild PI, Mild TR. Normal RV. s/p St Jude Bi-V ICD with epicardial LV lead. - Echo 12/2017 EF 20% stable. (Personally reviewed) - CPX in 8/16 was similar to the past: it appeared that the major limitation was from body habitus/deconditioning and not cardiac - NYHA III symptoms - Volume status stable to dry on exam and corevue - Decrease Entresto 49/51 mg BID.  - Continue lasix 40 mg M/W/F. May need to titrate up.  - Continue coreg 12.5 mg BID.  - Continue spironolactone 25 daily. Recent K was 3.8  - Continue corlanor to 5 mg BID.  - Reinforced fluid restriction to < 2 L daily, sodium restriction to less than 2000 mg daily, and the importance of daily weights.   2. Paroxysmal atrial fibrillation:  - This was post-op.  - NSR on exam. No AT/AF by corevue.  - Continue ASA 81 mg daily.  - If recurs, will need antiocoag with CHA2DS2/VAS of 3.  3. Type B aortic dissection:  - Stable. BP has not been elevated.  4. HTN - Meds as above.   Recent labs stable. Dry on exam and corevue. Cutting back Isabel as above, suspect she will equilibrate. OK to liberalize fluid intake tonight. RTC 2 months. Sooner with symptoms.   [name], PA-C 04/19/2018   Patient seen and examined with the above-signed Advanced Practice Provider and/or Housestaff. I personally reviewed laboratory data, imaging studies and relevant notes. I independently examined the patient and formulated the important aspects of the plan. I have edited the note to reflect any of my changes or salient points. I have personally  discussed the plan with the patient and/or family.  Remains stable NYHA III. BP low recently after recent Entresto titration. Will cut back Entresto. Can  increase lasix as needed. I worry she may be progressing slowly. May need another CPX soon though I am not sure if she would qualify for advanced therapies with her comorbidities. ICD interrogated personally. Volume down. No VT.   Glori Bickers, MD  6:55 PM

## 2018-04-27 ENCOUNTER — Telehealth: Payer: Self-pay

## 2018-04-27 ENCOUNTER — Ambulatory Visit (INDEPENDENT_AMBULATORY_CARE_PROVIDER_SITE_OTHER): Payer: PRIVATE HEALTH INSURANCE

## 2018-04-27 DIAGNOSIS — Z95810 Presence of automatic (implantable) cardiac defibrillator: Secondary | ICD-10-CM | POA: Diagnosis not present

## 2018-04-27 DIAGNOSIS — I5022 Chronic systolic (congestive) heart failure: Secondary | ICD-10-CM

## 2018-04-27 NOTE — Telephone Encounter (Signed)
Remote ICM transmission received.  Attempted call to patient and left detailed message, per DPR, regarding transmission and next ICM scheduled for 06/11/2018.  Advised to return call for any fluid symptoms or questions.

## 2018-04-27 NOTE — Progress Notes (Signed)
EPIC Encounter for ICM Monitoring  Patient Name: [name] is a 67 y.o. female Date: 04/27/2018 Primary Care Physican: [name], MD Primary Cardiologist:Bensimhon Electrophysiologist: Allred Dry Weight:unknown Bi-V Pacing: 93%       Attempted call to patient and unable to reach.  Left detailed message, per DPR, regarding transmission.  Transmission reviewed.    Thoracic impedance normal but was abnormal suggesting fluid accumulation from 04/12/2018 - 04/16/2018.  Prescribed dosage: Furosemide 40 mg 1 tablet three times a week Monday, Wed and Friday as needed. Per Dr Bensimhon'soffice visit note,04/19/2018, may need to titrate up with Lasix.  Labs: 04/04/2018 Creatinine 1.26, BUN 20, Potassium 4.4, Sodium 139, EGFR 44-51 03/22/2017 Creatinine 1.19, BUN 12, Potassium 3.6, Sodium 139, EGFR 47-54 02/20/2017 Creatinine 1.24, BUN 27, Potassium 3.6, Sodium 138, EGFR 44-51 09/30/2016 Creatinine 1.21, BUN 17, Potassium 3.7, Sodium 139  Recommendations: Left voice mail with ICM number and encouraged to call if experiencing any fluid symptoms.  Follow-up plan: ICM clinic phone appointment on 06/11/2018.  Office appointment scheduled 05/11/2018 with Tommye Standard, PA and Dr Haroldine Laws on 06/19/2018.   Copy of ICM check sent to Dr. [name].   3 month ICM trend: 04/27/2018    1 Year ICM trend:       [name], RN 04/27/2018 8:11 AM

## 2018-05-11 ENCOUNTER — Ambulatory Visit: Payer: PRIVATE HEALTH INSURANCE | Admitting: Physician Assistant

## 2018-05-11 VITALS — BP 96/60 | HR 64 | Ht 62.5 in | Wt 246.0 lb

## 2018-05-11 DIAGNOSIS — I10 Essential (primary) hypertension: Secondary | ICD-10-CM

## 2018-05-11 DIAGNOSIS — I4891 Unspecified atrial fibrillation: Secondary | ICD-10-CM | POA: Diagnosis not present

## 2018-05-11 DIAGNOSIS — I5022 Chronic systolic (congestive) heart failure: Secondary | ICD-10-CM

## 2018-05-11 DIAGNOSIS — Z95810 Presence of automatic (implantable) cardiac defibrillator: Secondary | ICD-10-CM | POA: Diagnosis not present

## 2018-05-11 DIAGNOSIS — I493 Ventricular premature depolarization: Secondary | ICD-10-CM

## 2018-05-11 DIAGNOSIS — I480 Paroxysmal atrial fibrillation: Secondary | ICD-10-CM

## 2018-05-11 NOTE — Progress Notes (Signed)
Electrophysiology Office Note Date: 05/11/2018  ID:  [name], DOB 23-Apr-1951, MRN [id number]  PCP: [name]., MD Primary Cardiologist: Dr. Re[facility] Electrophysiologist: [name]  CC: annual EP/device visit  [name] is a 66 y.o. female with PMHx of NICM suspect 2/2 chemo tx,. Cath Dec 2011 showed normal arteries, Type B aortic dissection treated medically, LBBB, HTN, HLD, CHF, an event of post-op AF in 2012, breast cancer with lumpectomy chemo and Rad tx comes today to be seen for Dr. Rayann Davis.  She last saw EP service with Brittney Churn, NP June 2018.  At that time she was doing well actively working on weight loss, still working with [employer], no changes were made to her tx.  She did note some brief AF episodes, though not enough to warrant a/c.  She is accompanied by her husband.  They both feel like she is doing well.  The patient reports occasional orthostatic symptoms of lightheadedness, she has discussed this with Dr. Haroldine Davis and her lasix was adjusted with improvement.  She states sometimes she doesn't keep up with her fluid intake instructions and can tend to get dehydrated.  She denies ny near syncope or syncope.  No CP, palpitations, no rest SOB, minimal DOE, no symptoms of PND or orthopnea.  She will start the evening with 2 pillows to watch TV though has to go to one to sleep comfortably.  She is not weighing daily, does not feel like she is retaining fluid, in-fact thinks she is a little dry with a dry mouth today.   Device History: STJ CRT-D implanted 2012 for NICM, LBBB, CHF (epicardial LV lead placed by [facility] 2012), gen change 01/12/16 History of appropriate therapy: No She was shocked once post epicardial lead for RAFib, none since. There are 2 lifetime charges for the device, implant record shows DFT testing was done. History of AAD therapy: No  Past Medical History:  Diagnosis Date  . Aortic dissection ([name]) 12/2008   Type B  . Arthritis     lower back  . Atrial fibrillation ([place])    post op (left thoracotomy) 11/12 req amio/DCCV  . Breast cancer ([name]) 07/2009   Stage 3 lumpectomy, radiation, chemotherapy; finished chemo October, felt to be in remission  . Cardiac defibrillator in place    St. Jude (JA) 9/12; LV lead placement unsuccessful;  s/p left thoracotomy with placement of epicardial LV lead 09/2011 (Dr. PVT)  . CHF (congestive heart failure) ([name])   . Chronic systolic dysfunction of left ventricle   . Depression   . DVT (deep venous thrombosis) ([name]) 10/18/2010  . GERD (gastroesophageal reflux disease)   . Glaucoma   . Hiatal hernia    small hiatal hernia  . History of shingles   . Hyperlipidemia   . Hypertension   . Left bundle branch block   . Nonischemic cardiomyopathy ([name])    Admission 12/11 with EF 25%, normal coronary arteries by cath 12/11; NICM presumed to be secondary to chemotherapy for breast cancer  . Obesity   . [name]    with exertion   Past Surgical History:  Procedure Laterality Date  . BREAST LUMPECTOMY     Right breast  . CARDIAC CATHETERIZATION     2011  . CARDIAC DEFIBRILLATOR PLACEMENT    . EP IMPLANTABLE DEVICE N/A 01/12/2016   STJ ICD Unify Assura gen change, Dr. Rayann Davis  . OOPHORECTOMY     Single  . THORACOTOMY  09/16/2011  Procedure: THORACOTOMY MAJOR;  Surgeon: Brittney Aquas Adelene Idler, MD;  Location: [facility] OR;  Service: Thoracic;  Laterality: Left;  left anterolateral Thoracotomy for placement of St. Jude epicardial pacing lead   . TONSILLECTOMY    . TUBAL LIGATION     Bilateral    Current Outpatient Medications  Medication Sig Dispense Refill  . albuterol (PROVENTIL HFA;VENTOLIN HFA) 108 (90 BASE) MCG/ACT inhaler Inhale 2 puffs into the lungs every 4 (four) hours as needed. [name]     . albuterol (PROVENTIL) (2.5 MG/3ML) 0.083% nebulizer solution Take 2.5 mg by nebulization every 4 (four) hours as needed. [name]     . allopurinol  (ZYLOPRIM) 300 MG tablet Take 1 tablet (300 mg total) by mouth daily. 90 tablet 1  . ALPRAZolam (XANAX) 1 MG tablet Take 0.5 mg by mouth daily as needed for anxiety. Anxiety     . aspirin 81 MG EC tablet Take 81 mg by mouth daily.      [name] azelastine (OPTIVAR) 0.05 % ophthalmic solution Place 1 drop into both eyes daily.    . Bepotastine Besilate (BEPREVE) 1.5 % SOLN Place 1 drop into both eyes daily as needed. Dry eyes     . bimatoprost (LUMIGAN) 0.03 % ophthalmic solution Place 1 drop into both eyes at bedtime. Reported on 01/12/2016    . Calcium Carbonate-Vitamin D (CALCIUM-VITAMIN D3) 600-200 MG-UNIT TABS Take 1 tablet by mouth 2 (two) times daily.      . carvedilol (COREG) 12.5 MG tablet Take 12.5 mg by mouth 2 (two) times daily with a meal.    . cholecalciferol (VITAMIN D) 1000 UNITS tablet Take 1,000 Units by mouth 2 (two) times daily.      . colchicine 0.6 MG tablet Take 1 tablet (0.6 mg total) by mouth daily as needed. 30 tablet 1  . cyclobenzaprine (FLEXERIL) 10 MG tablet Take 1 tablet (10 mg total) by mouth 3 (three) times daily as needed for muscle spasms. 30 tablet 0  . diclofenac sodium (VOLTAREN) 1 % GEL APPLY THREE GRAMS TO THREE LARGE JOINTS UP TO THREE TIMES DAILY AS NEEDED 3 Tube 3  . docusate sodium (COLACE) 100 MG capsule Take 100 mg by mouth 2 (two) times daily.    . fexofenadine (ALLEGRA) 180 MG tablet Take 180 mg by mouth daily as needed. Allergies     . fluticasone (FLONASE) 50 MCG/ACT nasal spray Place 2 sprays into the nose daily as needed. Allergies     . furosemide (LASIX) 40 MG tablet Take 1 tablet (40 mg total) by mouth 3 (three) times a week. Monday, Wednesday and Friday and as needed 90 tablet 3  . HYDROcodone-acetaminophen (NORCO/VICODIN) 5-325 MG tablet TAKE ONE TABLET BY MOUTH EVERY 6 HOURS PRN    . ivabradine ([name]) 5 MG TABS tablet Take 1 tablet (5 mg total) by mouth 2 (two) times daily with a meal. 60 tablet 3  . letrozole (FEMARA) 2.5 MG tablet Take 2.5  mg by mouth every evening.      . montelukast (SINGULAIR) 10 MG tablet Take 10 mg by mouth at bedtime. For allergies & congestion    . Multiple Vitamin (MULTIVITAMIN) tablet Take 1 tablet by mouth daily.      [name] 20 MG capsule Take 20 mg by mouth 2 (two) times daily.      [name] PARoxetine (PAXIL) 20 MG tablet Take 20 mg by mouth daily.  1  . Polyethyl Glycol-Propyl Glycol (SYSTANE OP) Apply 1  drop to eye daily as needed (dry eyes).     . sacubitril-valsartan (ENTRESTO) 49-51 MG Take 1 tablet by mouth 2 (two) times daily. 60 tablet 3  . senna-docusate (SENOKOT-S) 8.6-50 MG per tablet Take 1 tablet by mouth daily as needed for mild constipation.     [name] spironolactone (ALDACTONE) 25 MG tablet Take 1 tablet (25 mg total) by mouth daily. 90 tablet 3  . traMADol (ULTRAM) 50 MG tablet Take 50 mg by mouth every 6 (six) hours as needed for pain.     No current facility-administered medications for this visit.     Allergies:   Alphagan [brimonidine]; Mobic [meloxicam]; Nsaids; Polyvinyl alcohol; Statins; Tolmetin; Travatan z [travoprost (bak free)]; and Tape   Social History: Social History   Socioeconomic History  . Marital status: Married    Spouse name: Not on file  . Number of children: Not on file  . Years of education: Not on file  . Highest education level: Not on file  Occupational History  . Occupation: Oceanographer: [employer]  . Occupation: Retired from Financial controller  . Financial resource strain: Not on file  . Food insecurity:    Worry: Not on file    Inability: Not on file  . Transportation needs:    Medical: Not on file    Non-medical: Not on file  Tobacco Use  . Smoking status: Never Smoker  . Smokeless tobacco: Never Used  Substance and Sexual Activity  . Alcohol use: No  . Drug use: No  . Sexual activity: Not on file  Lifestyle  . Physical activity:    Days per week: Not on file    Minutes per session: Not on file  .  Stress: Not on file  Relationships  . Social connections:    Talks on phone: Not on file    Gets together: Not on file    Attends religious service: Not on file    Active member of club or organization: Not on file    Attends meetings of clubs or organizations: Not on file    Relationship status: Not on file  . Intimate partner violence:    Fear of current or ex partner: Not on file    Emotionally abused: Not on file    Physically abused: Not on file    Forced sexual activity: Not on file  Other Topics Concern  . Not on file  Social History Narrative   Married   One child   Lives in [place] with spouse and mother in law   Previously worked as a Art gallery manager for [employer]      She is a Leisure centre manager counsel member.    Family History: Family History  Problem Relation Age of Onset  . Hypertension Mother   . Glaucoma Mother   . Atrial fibrillation Mother   . Hypertension Father   . Hypertension Brother   . Hypertension Maternal Grandfather   . Heart attack Maternal Grandfather        MI  . Coronary artery disease Maternal Grandfather   . Hypertension Paternal Grandfather   . Heart attack Paternal Grandfather        MI  . Coronary artery disease Paternal Grandfather   . Hypertension Brother     Review of Systems: All other systems reviewed and are otherwise negative except as noted above.   Physical Exam: VS:  BP 96/60   Pulse 64   Ht  5' 2.5" (1.588 m)   Wt 246 lb (111.6 kg)   BMI 44.28 kg/m  , BMI Body mass index is 44.28 kg/m.  GEN- The patient is obese appearing, alert and oriented x 3 today.   HEENT: normocephalic, atraumatic; sclera clear, conjunctiva pink; hearing intact; oropharynx clear; neck supple  Lungs- CTA b/l, normal work of breathing.  No wheezes, rales, rhonchi Heart- RRR, no significant murmurs, no gallops or rubs GI- soft, non-tender, non-distended, obese Extremities- no clubbing, cyanosis, no pitting edema MS- no significant deformity or  atrophy Skin- warm and dry, no rash or lesion Psych- euthymic mood, full affect Neuro- strength and sensation are intact  ICD site is stable, no tethering or discomfort  EKG:  Done today and reviewed by myself: SR, V paced, PVCs ICD interrogation: battery and lead measurements are good, no AF or VT, she has some old AMS episodes that are not true AF.  Cap confirm test run and found acceptable.  12/14/17: TTE Study Conclusions - Left ventricle: Poor acoustic windows limit study even with   Definity use. LVEF is severely depressed. The cavity size was   severely dilated. Wall thickness was normal. Doppler parameters   are consistent with abnormal left ventricular relaxation (grade 1   diastolic dysfunction). - Right ventricle: Systolic function was mildly reduced.  CPX (8/16): Peak VO2 10.1 VE/VCO2 slope 28.4 RER 1.14 OUES 1.3 PFTs mildly restrictive No significant change compared to prior. Limited more by obesity and restrictive lung physiology than heart (mild circulatory limitation).   ECHO 01/02/13 EF 02% Grade 1 diastolic dysfunction. RV ok. No PAH ECHO 07/10/13 EF 20% RV ok ECHO 10/13/14 EF 15-20% RV ok.   10/13/15: Echocardiogram Study Conclusions - Left ventricle: The cavity size was severely dilated. Wall thickness was normal. The estimated ejection fraction was in the range of 10% to 15%. Diffuse hypokinesis. Doppler parameters are consistent with abnormal left ventricular relaxation (grade 1 diastolic dysfunction). Doppler parameters are consistent with high ventricular filling pressure. - Left atrium: The atrium was moderately dilated.  Recent Labs  Wt Readings from Last 3 Encounters:  05/11/18 246 lb (111.6 kg)  04/19/18 238 lb 12.8 oz (108.3 kg)  04/04/18 242 lb (109.8 kg)     Other studies Reviewed: Additional studies/ records that were reviewed today include: AHF notes, Dr Jackalyn Lombard last office notes  Assessment and Plan:  1.  Chronic systolic  dysfunction/NICM      CorVue suggests fluid onboard, weight is up 4 lbs from last (at CHF clinic)      No exam findings or symptoms of overt fluid OL      On BB, entresto, , corlanor, lasix and aldactone      Follows closely with AHF team      She has guidelines from Dr. Haroldine Davis for her lasix, she is not weighing daily and is instructed on this      V pacing 93%  2. ICD     Intact function     No changes made   3.  Paroxysmal atrial fibrillation, one known event in 2012 post-op      None on today's interrogation      Noted last year one AF episode, not felt warranted a/c      Continue to monitor via her device    4. PVCs on today's EKG     Only 2.2% PVC burden by device     follow  5.  HTN      Relative hypotension, per  the patient today's is at her new baseline, typically SBP 90's  6.  Type B aortic dissection       BP is very well controlled   Current medicines are reviewed at length with the patient today.   The patient does not have concerns regarding her medicines.  The following changes were made today:  none   Disposition:   Continue Q 94mo remote checks, 1 year EP in-clinic, sooner if needed.      Brittney Night, PA-C 05/11/2018 1:57 PM  [address] [place] [address] [phone] (office) [phone] (fax

## 2018-05-11 NOTE — Patient Instructions (Addendum)
Medication Instructions:   Your physician recommends that you continue on your current medications as directed. Please refer to the Current Medication list given to you today.   If you need a refill on your cardiac medications before your next appointment, please call your pharmacy.  Labwork: NONE ORDERED  TODAY    Testing/Procedures: NONE ORDERED  TODAY    Follow-Up: Your physician wants you to follow-up in: [place] will receive a reminder letter in the mail two months in advance. If you don't receive a letter, please call our office to schedule the follow-up appointment.   Remote monitoring is used to monitor your Pacemaker of ICD from home. This monitoring reduces the number of office visits required to check your device to one time per year. It allows [place] to keep an eye on the functioning of your device to ensure it is working properly. You are scheduled for a device check from home on . 05-24-18 You may send your transmission at any time that day. If you have a wireless device, the transmission will be sent automatically. After your physician reviews your transmission, you will receive a postcard with your next transmission date.     Any Other Special Instructions Will Be Listed Below (If Applicable).

## 2018-05-24 ENCOUNTER — Telehealth: Payer: Self-pay | Admitting: Rheumatology

## 2018-05-24 ENCOUNTER — Ambulatory Visit (INDEPENDENT_AMBULATORY_CARE_PROVIDER_SITE_OTHER): Payer: PRIVATE HEALTH INSURANCE | Admitting: *Deleted

## 2018-05-24 DIAGNOSIS — I480 Paroxysmal atrial fibrillation: Secondary | ICD-10-CM

## 2018-05-24 DIAGNOSIS — I428 Other cardiomyopathies: Secondary | ICD-10-CM

## 2018-05-24 NOTE — Telephone Encounter (Signed)
Patient called checking the status of one of her medications (couldn't remember the name but thinks it starts with an E)  that needed additional information.  Patient states a fax was sent to our office to be filled out and sent back.

## 2018-05-24 NOTE — Telephone Encounter (Signed)
Called and left VM for patient to return my call. 

## 2018-05-24 NOTE — Telephone Encounter (Signed)
Patient called stating she was returning your call.   °

## 2018-05-24 NOTE — Telephone Encounter (Signed)
Talked with patient and advised her that Euflexxa was submitted under her pharmacy benefits and Euflexxa is not covered under pharmacy with her insurance.  Submitted Euflexxa under medical benefits through patient's insurance.  Will give patient a call back once information has been received for Euflexxa injection.

## 2018-05-24 NOTE — Progress Notes (Signed)
Remote ICD transmission.   

## 2018-05-29 ENCOUNTER — Other Ambulatory Visit (HOSPITAL_COMMUNITY): Payer: Self-pay | Admitting: Internal Medicine

## 2018-05-30 ENCOUNTER — Telehealth (INDEPENDENT_AMBULATORY_CARE_PROVIDER_SITE_OTHER): Payer: Self-pay

## 2018-05-30 NOTE — Telephone Encounter (Signed)
Submitted PA through Covermymeds. PA currently pending.

## 2018-05-31 ENCOUNTER — Telehealth (INDEPENDENT_AMBULATORY_CARE_PROVIDER_SITE_OTHER): Payer: Self-pay

## 2018-05-31 NOTE — Telephone Encounter (Signed)
Submitted for Orthovisc series, bilateral knee due to Euflexxa not being covered by patient's insurance under medical or pharmacy benefits.

## 2018-06-01 ENCOUNTER — Telehealth: Payer: Self-pay | Admitting: Rheumatology

## 2018-06-01 NOTE — Telephone Encounter (Signed)
Donnelly Angelica from [facility] called requesting a return call at [phone] ext [phone]

## 2018-06-04 ENCOUNTER — Telehealth (INDEPENDENT_AMBULATORY_CARE_PROVIDER_SITE_OTHER): Payer: Self-pay

## 2018-06-04 NOTE — Telephone Encounter (Signed)
Submitted application for Orthovisc, bilateral knee to be obtained through [facility] under medical benefits. Faxed Rx for Orthovisc to [facility] at [phone].

## 2018-06-04 NOTE — Telephone Encounter (Signed)
Talked with patient and updated her with information concerning gel injection.  Advised patient that she is approved to have Orthovisc series, bilateral knee through her insurance under the [facility].  Patient stated that she understands.

## 2018-06-04 NOTE — Telephone Encounter (Signed)
Called and left a VM for [name] to return my call concerning patient.

## 2018-06-11 ENCOUNTER — Ambulatory Visit (INDEPENDENT_AMBULATORY_CARE_PROVIDER_SITE_OTHER): Payer: PRIVATE HEALTH INSURANCE

## 2018-06-11 DIAGNOSIS — I5022 Chronic systolic (congestive) heart failure: Secondary | ICD-10-CM | POA: Diagnosis not present

## 2018-06-11 DIAGNOSIS — Z95810 Presence of automatic (implantable) cardiac defibrillator: Secondary | ICD-10-CM

## 2018-06-11 DIAGNOSIS — M25511 Pain in right shoulder: Secondary | ICD-10-CM

## 2018-06-11 NOTE — Progress Notes (Signed)
EPIC Encounter for ICM Monitoring  Patient Name: [name] is a 67 y.o. female Date: 06/11/2018 Primary Care Physican: Grisso, Greg A., MD Primary Cardiologist:Bensimhon Electrophysiologist: Allred Dry Weight:unknown Bi-V Pacing: 97%       Attempted call to patient and unable to reach.  Left detailed message, per DPR, regarding transmission.  Transmission reviewed.    Thoracic impedance normal but is trending above baseline suggesting dryness.  Prescribed dosage: Furosemide 40 mg 1 tablet three times a week Monday, Wed and Friday as needed. Per Dr Bensimhon'soffice visit note,04/19/2018, may need to titrate up with Lasix.  Labs: 04/04/2018 Creatinine 1.26, BUN 20, Potassium 4.4, Sodium 139, EGFR 44-51 03/22/2017 Creatinine 1.19, BUN 12, Potassium 3.6, Sodium 139, EGFR 47-54 02/20/2017 Creatinine 1.24, BUN 27, Potassium 3.6, Sodium 138, EGFR 44-51 09/30/2016 Creatinine 1.21, BUN 17, Potassium 3.7, Sodium 139  Recommendations: Left voice mail with ICM number and encouraged to call if experiencing any fluid symptoms.  Follow-up plan: ICM clinic phone appointment on 07/12/2018.       Copy of ICM check sent to Dr. Allred.   3 month ICM trend: 06/11/2018    1 Year ICM trend:       Laurie S Short, RN 06/11/2018 3:23 PM   

## 2018-06-12 ENCOUNTER — Telehealth: Payer: Self-pay

## 2018-06-12 NOTE — Telephone Encounter (Signed)
Remote ICM transmission received.  Attempted call to patient and left detailed message, per DPR, regarding transmission and next ICM scheduled for 07/12/2018.  Advised to return call for any fluid symptoms or questions.

## 2018-06-19 ENCOUNTER — Ambulatory Visit (HOSPITAL_COMMUNITY)
Admission: RE | Admit: 2018-06-19 | Discharge: 2018-06-19 | Disposition: A | Discharge status: Home / Self Care | Payer: PRIVATE HEALTH INSURANCE | Source: Ambulatory Visit | Attending: Internal Medicine | Admitting: Internal Medicine

## 2018-06-19 VITALS — BP 98/72 | HR 100 | Wt 246.4 lb

## 2018-06-19 DIAGNOSIS — I480 Paroxysmal atrial fibrillation: Secondary | ICD-10-CM | POA: Diagnosis not present

## 2018-06-19 DIAGNOSIS — Z79899 Other long term (current) drug therapy: Secondary | ICD-10-CM | POA: Insufficient documentation

## 2018-06-19 DIAGNOSIS — Z86718 Personal history of other venous thrombosis and embolism: Secondary | ICD-10-CM | POA: Diagnosis not present

## 2018-06-19 DIAGNOSIS — E669 Obesity, unspecified: Secondary | ICD-10-CM | POA: Insufficient documentation

## 2018-06-19 DIAGNOSIS — I5022 Chronic systolic (congestive) heart failure: Secondary | ICD-10-CM

## 2018-06-19 DIAGNOSIS — I447 Left bundle-branch block, unspecified: Secondary | ICD-10-CM | POA: Diagnosis not present

## 2018-06-19 DIAGNOSIS — Z95810 Presence of automatic (implantable) cardiac defibrillator: Secondary | ICD-10-CM | POA: Insufficient documentation

## 2018-06-19 DIAGNOSIS — K219 Gastro-esophageal reflux disease without esophagitis: Secondary | ICD-10-CM | POA: Diagnosis not present

## 2018-06-19 DIAGNOSIS — F329 Major depressive disorder, single episode, unspecified: Secondary | ICD-10-CM | POA: Diagnosis not present

## 2018-06-19 DIAGNOSIS — Z7982 Long term (current) use of aspirin: Secondary | ICD-10-CM | POA: Diagnosis not present

## 2018-06-19 DIAGNOSIS — I110 Hypertensive heart disease with heart failure: Secondary | ICD-10-CM | POA: Diagnosis present

## 2018-06-19 DIAGNOSIS — Z853 Personal history of malignant neoplasm of breast: Secondary | ICD-10-CM | POA: Insufficient documentation

## 2018-06-19 DIAGNOSIS — E785 Hyperlipidemia, unspecified: Secondary | ICD-10-CM | POA: Insufficient documentation

## 2018-06-19 DIAGNOSIS — I428 Other cardiomyopathies: Secondary | ICD-10-CM | POA: Diagnosis not present

## 2018-06-19 DIAGNOSIS — G4733 Obstructive sleep apnea (adult) (pediatric): Secondary | ICD-10-CM | POA: Insufficient documentation

## 2018-06-19 MED ORDER — IVABRADINE HCL 5 MG PO TABS: 2.5000 mg | ORAL_TABLET | Freq: Two times a day (BID) | ORAL | 3 refills | Status: DC

## 2018-06-19 NOTE — Progress Notes (Signed)
ADVANCED HF CLINIC NOTE   Patient ID: [name], female   DOB: 1951/04/25, 67 y.o.   MRN: [id number] Oncologist: Dr. Philipp Ovens at [facility] EP: Dr. [name] Dr. Bea Graff in [place] HF: Bensimhon  HPI: [name] is a 67 y.o. female a cardiac history that includes HTN, Type B aortic dissection (2010) managed with medical therapy by Dr. Prescott Gum and has healed, LBBB, systolic heart failure due to NICM with EF 10-15% in May 2013 which has decreased from 65% in 2010.  Cath Dec 2011 showed normal arteries.  She was diagnosed with breast cancer in 2009 s/p lumpectomy, XRT, and chemotherapy.  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   She underwent St Jude Bi-V ICD implantation Sept 2012 but unable to place LV lead.  She eventually brought back in Nov 2012 for left thoracotomy and placement of epicardial LV lead placement by Dr. Prescott Gum.  She also had a sleep studythat showed mild OSA.  No clinical intervention occurred.    She presents today for regular follow up. Last visit Entresto decreased with corvue showing she was dry and BP low.  Overall doing okay. Says SBP runs 95-100 but occasionally will drop into the 70s.  She feels dizzy when BP runs low. Taking lasix only MWF. Rarely takes extra. She is SOB with inclines. SOB with walking on flat ground in the heat. SOB at times with ADLs. Limited activity due to hips and knees. Occasional swelling in BLE. Denies orthopnea or PND. No CP. No palpitations Weights at home 246 lbs. Taking all medications. Drinking extra water over the last week because she felt "dry". Typically limits fluid <2 L. Eats out half of meals. Does not add salt to foods. Holding [name] when HR drops below 50.   Echo 12/2017 LVEF 20% RV ok.  ICD: no VT/VF volume ok. Personally reviewed    10/10/2012 CPX Peak VO2: 10.6 % predicted peak VO2: 64.4% VE/VCO2 slope: 26.9 OUES: 1.18 Peak RER: 1.29 Ventilatory  Threshold: 7.3 % predicted peak VO2: 44.4% Peak RR 23 Peak Ventilation: 29.8 VE/MVV: 32.6% PETCO2 at peak: 43  01/2014 CPX Peak VO2: 10.3 (66.4% predicted peak VO2) VE/VCO2 slope: 25.0 OUES: 1.30 Peak RER: 1.20  CPX (8/16): Peak VO2 10.1 VE/VCO2 slope 28.4 RER 1.14 OUES 1.3 PFTs mildly restrictive No significant change compared to prior.  Limited more by obesity and restrictive lung physiology than heart (mild circulatory limitation).   ECHO 01/02/13 EF 78% Grade 1 diastolic dysfunction. RV ok. No PAH ECHO 07/10/13 EF 20% RV ok ECHO 10/13/14 EF 15-20% RV ok Echo 10/2015 EF 15%  Echo 09/30/16 LVEF 10-15%, Grade 1 DD, mild PI, Mild TR. Normal RV.  Review of systems complete and found to be negative unless listed in HPI.     Past Medical History:  Diagnosis Date  . Aortic dissection ([place]) 12/2008   Type B  . Arthritis    lower back  . Atrial fibrillation ([place])    post op (left thoracotomy) 11/12 req amio/DCCV  . Breast cancer ([place]) 07/2009   Stage 3 lumpectomy, radiation, chemotherapy; finished chemo October, felt to be in remission  . Cardiac defibrillator in place    St. Jude (JA) 9/12; LV lead placement unsuccessful;  s/p left thoracotomy with placement of epicardial LV lead 09/2011 (Dr. PVT)  . CHF (congestive heart failure) ([place])   . Chronic systolic dysfunction of left ventricle   . Depression   .  DVT (deep venous thrombosis) ([place]) 10/18/2010  . GERD (gastroesophageal reflux disease)   . Glaucoma   . Hiatal hernia    small hiatal hernia  . History of shingles   . Hyperlipidemia   . Hypertension   . Left bundle branch block   . Nonischemic cardiomyopathy ([place])    Admission 12/11 with EF 25%, normal coronary arteries by cath 12/11; NICM presumed to be secondary to chemotherapy for breast cancer  . Obesity   . [name]    with exertion   Current Outpatient Medications  Medication Sig Dispense Refill  . albuterol (PROVENTIL HFA;VENTOLIN HFA) 108 (90  BASE) MCG/ACT inhaler Inhale 2 puffs into the lungs every 4 (four) hours as needed. [name]     . albuterol (PROVENTIL) (2.5 MG/3ML) 0.083% nebulizer solution Take 2.5 mg by nebulization every 4 (four) hours as needed. [name]     . allopurinol (ZYLOPRIM) 300 MG tablet Take 1 tablet (300 mg total) by mouth daily. 90 tablet 1  . ALPRAZolam (XANAX) 1 MG tablet Take 0.5 mg by mouth daily as needed for anxiety. Anxiety     . aspirin 81 MG EC tablet Take 81 mg by mouth daily.      [name] azelastine (OPTIVAR) 0.05 % ophthalmic solution Place 1 drop into both eyes daily.    . Bepotastine Besilate (BEPREVE) 1.5 % SOLN Place 1 drop into both eyes daily as needed. Dry eyes     . bimatoprost (LUMIGAN) 0.03 % ophthalmic solution Place 1 drop into both eyes at bedtime. Reported on 01/12/2016    . Calcium Carbonate-Vitamin D (CALCIUM-VITAMIN D3) 600-200 MG-UNIT TABS Take 1 tablet by mouth 2 (two) times daily.      . carvedilol (COREG) 12.5 MG tablet Take 12.5 mg by mouth 2 (two) times daily with a meal.    . cholecalciferol (VITAMIN D) 1000 UNITS tablet Take 1,000 Units by mouth 2 (two) times daily.      . colchicine 0.6 MG tablet Take 1 tablet (0.6 mg total) by mouth daily as needed. 30 tablet 1  . [name] 5 MG TABS tablet TAKE ONE TABLET BY MOUTH TWICE DAILY WITH A MEAL 60 tablet 3  . cyclobenzaprine (FLEXERIL) 10 MG tablet Take 1 tablet (10 mg total) by mouth 3 (three) times daily as needed for muscle spasms. 30 tablet 0  . diclofenac sodium (VOLTAREN) 1 % GEL APPLY THREE GRAMS TO THREE LARGE JOINTS UP TO THREE TIMES DAILY AS NEEDED 3 Tube 3  . docusate sodium (COLACE) 100 MG capsule Take 100 mg by mouth 2 (two) times daily.    . fexofenadine (ALLEGRA) 180 MG tablet Take 180 mg by mouth daily as needed. Allergies     . fluticasone (FLONASE) 50 MCG/ACT nasal spray Place 2 sprays into the nose daily as needed. Allergies     . furosemide (LASIX) 40 MG tablet Take 1 tablet (40 mg total) by  mouth 3 (three) times a week. Monday, Wednesday and Friday and as needed 90 tablet 3  . HYDROcodone-acetaminophen (NORCO/VICODIN) 5-325 MG tablet TAKE ONE TABLET BY MOUTH EVERY 6 HOURS PRN    . letrozole (FEMARA) 2.5 MG tablet Take 2.5 mg by mouth every evening.      . montelukast (SINGULAIR) 10 MG tablet Take 10 mg by mouth at bedtime. For allergies & congestion    . Multiple Vitamin (MULTIVITAMIN) tablet Take 1 tablet by mouth daily.      [name] 20 MG capsule  Take 20 mg by mouth 2 (two) times daily.      [name] PARoxetine (PAXIL) 20 MG tablet Take 20 mg by mouth daily.  1  . Polyethyl Glycol-Propyl Glycol (SYSTANE OP) Apply 1 drop to eye daily as needed (dry eyes).     . sacubitril-valsartan (ENTRESTO) 49-51 MG Take 1 tablet by mouth 2 (two) times daily. 60 tablet 3  . senna-docusate (SENOKOT-S) 8.6-50 MG per tablet Take 1 tablet by mouth daily as needed for mild constipation.     [name] spironolactone (ALDACTONE) 25 MG tablet Take 1 tablet (25 mg total) by mouth daily. 90 tablet 3  . traMADol (ULTRAM) 50 MG tablet Take 50 mg by mouth every 6 (six) hours as needed for pain.     No current facility-administered medications for this encounter.    PHYSICAL EXAM: Vitals:   06/19/18 1312  BP: 98/72  Pulse: 100  SpO2: 97%  Weight: 111.8 kg (246 lb 6 oz)   Wt Readings from Last 3 Encounters:  06/19/18 111.8 kg (246 lb 6 oz)  05/11/18 111.6 kg (246 lb)  04/19/18 108.3 kg (238 lb 12.8 oz)   Physical Exam  General: Obese woman walks with cane No resp difficulty. HEENT: Normal anicteric  Neck: Supple. JVP 5-6. Carotids 2+ bilat; no bruits. No thyromegaly or nodule noted. Cor: PMI nondisplaced. RRR, No M/G/R noted Lungs: CTAB, normal effort. Abdomen: Obese Soft, non-tender, non-distended, no HSM. No bruits or masses. +BS  Extremities: no cyanosis, clubbing, rash, edema Neuro: alert & oriented x 3, cranial nerves grossly intact. moves all 4 extremities w/o difficulty. Affect  pleasant   ASSESSMENT & PLAN: 1. Chronic systolic CHF: Nonischemic cardiomyopathy.  Echo 09/30/16 LVEF 10-15%, Grade 1 DD, mild PI, Mild TR. Normal RV. s/p St Jude Bi-V ICD with epicardial LV lead. - Echo 12/2017 EF 20% stable. (Personally reviewed) - CPX in 8/16 was similar to the past: it appeared that the major limitation was from body habitus/deconditioning and not cardiac - NYHA III symptoms - Volume status ok on exam  - Continue Entresto 49/51 mg BID.  - Continue lasix 40 mg M/W/F. May need to titrate up.  - Continue coreg 12.5 mg BID.  - Continue spironolactone 25 daily. Creatinine stable 1.26, K 4.4 03/2018 - Drop corlanor to 2.5 bid - Reinforced fluid restriction to < 2 L daily, sodium restriction to less than 2000 mg daily, and the importance of daily weights.   - Consider repeating CPX. Not sure if she would qualify for advanced therapies with her comorbidities.  2. Paroxysmal atrial fibrillation:  - This was post-op.  - NSR on exam. No AT/AF by corevue.  - Continue ASA 81 mg daily.  - If recurs, will need antiocoag with CHA2DS2/VAS of 4.  3. Type B aortic dissection:  - Stable. BP has not been elevated.  4. HTN  - Meds as above.   Georgiana Shore, NP 06/19/2018   Patient seen and examined with the above-signed Advanced Practice Provider and/or Housestaff. I personally reviewed laboratory data, imaging studies and relevant notes. I independently examined the patient and formulated the important aspects of the plan. I have edited the note to reflect any of my changes or salient points. I have personally discussed the plan with the patient and/or family.  Overall remains stable NYHA III. Volume status ok on exam and by ICD interrogation, No VT/VF. HR seems improved and having some bradycardia at times. Will drop corlanor to 2.5 bid. I continue to worry about  her obesity and limited functional capacity. Suspect these would preclude advanced therapies if/when time comes. I have  encouraged her to get more physical activity with rejoining water aerobics or silver sneakers progra,   Glori Bickers, MD  11:59 PM

## 2018-06-19 NOTE — Patient Instructions (Signed)
Decrease [name] to 2.5 mg (1/2 tab) Twice daily  Your physician recommends that you schedule a follow-up appointment in: 3 months

## 2018-06-20 NOTE — Telephone Encounter (Signed)
Patient called to schedule injections.  Is it okay to schedule?

## 2018-06-20 NOTE — Telephone Encounter (Signed)
Please do not schedule.  Talked with patient and advised her that case is still pending, currently awaiting a F/U update from [place], case manager with [facility].  E-mail has been sent to [place], Tourist information centre manager per Willene Hatchet. With [facility].

## 2018-07-03 ENCOUNTER — Other Ambulatory Visit: Payer: Self-pay | Admitting: Physician Assistant

## 2018-07-03 ENCOUNTER — Other Ambulatory Visit (HOSPITAL_COMMUNITY): Payer: Self-pay | Admitting: Internal Medicine

## 2018-07-03 NOTE — Telephone Encounter (Signed)
Last Visit: 04/04/18 Next Visit due December 2019. Message sent to the front to schedule an appointment.  Labs: 04/04/18 CBC stable. Creatinine and GFR are stable.   Okay to refill per Dr. Estanislado Pandy

## 2018-07-04 LAB — CUP PACEART REMOTE DEVICE CHECK
Battery Remaining Longevity: 53 mo
Brady Statistic AP VP Percent: 1.9 %
Brady Statistic AP VS Percent: 1 %
Brady Statistic AS VS Percent: 2.6 %
Date Time Interrogation Session: 20190725060016
HIGH POWER IMPEDANCE MEASURED VALUE: 125 Ohm
HighPow Impedance: 125 Ohm
Implantable Lead Implant Date: 20120914
Implantable Lead Implant Date: 20121116
Implantable Lead Location: 753858
Implantable Pulse Generator Implant Date: 20170314
Lead Channel Impedance Value: 400 Ohm
Lead Channel Pacing Threshold Amplitude: 0.75 V
Lead Channel Pacing Threshold Amplitude: 1 V
Lead Channel Pacing Threshold Amplitude: 1.625 V
Lead Channel Pacing Threshold Pulse Width: 0.5 ms
Lead Channel Sensing Intrinsic Amplitude: 1.2 mV
Lead Channel Setting Pacing Amplitude: 2 V
Lead Channel Setting Pacing Amplitude: 2.5 V
Lead Channel Setting Pacing Pulse Width: 0.5 ms
Lead Channel Setting Sensing Sensitivity: 0.5 mV
MDC IDC LEAD IMPLANT DT: 20120914
MDC IDC LEAD LOCATION: 753859
MDC IDC LEAD LOCATION: 753860
MDC IDC LEAD SERIAL: 194059
MDC IDC MSMT BATTERY REMAINING PERCENTAGE: 66 %
MDC IDC MSMT BATTERY VOLTAGE: 2.96 V
MDC IDC MSMT LEADCHNL LV IMPEDANCE VALUE: 290 Ohm
MDC IDC MSMT LEADCHNL LV PACING THRESHOLD PULSEWIDTH: 0.5 ms
MDC IDC MSMT LEADCHNL RV IMPEDANCE VALUE: 400 Ohm
MDC IDC MSMT LEADCHNL RV PACING THRESHOLD PULSEWIDTH: 0.7 ms
MDC IDC MSMT LEADCHNL RV SENSING INTR AMPL: 11.5 mV
MDC IDC SET LEADCHNL RA PACING AMPLITUDE: 1.75 V
MDC IDC SET LEADCHNL RV PACING PULSEWIDTH: 0.7 ms
MDC IDC STAT BRADY AS VP PERCENT: 95 %
MDC IDC STAT BRADY RA PERCENT PACED: 1.3 %
Pulse Gen Serial Number: 7328650

## 2018-07-09 ENCOUNTER — Telehealth (INDEPENDENT_AMBULATORY_CARE_PROVIDER_SITE_OTHER): Payer: Self-pay

## 2018-07-09 ENCOUNTER — Telehealth: Payer: Self-pay | Admitting: Rheumatology

## 2018-07-09 NOTE — Telephone Encounter (Signed)
Talked with patient concerning gel injection.  See message in patient's chart.

## 2018-07-09 NOTE — Telephone Encounter (Signed)
Talked with rep at [facility] to check status for Orthovisc Injection, bilateral knee.  Advised that a PA is needed and was provided [facility] number to call and initiate PA at [phone].  Talked with [place] with [facility] and was advised that PA form will be faxed to our office today.  Talked with patient and advised her of message above.  Patient stated that she understands.

## 2018-07-09 NOTE — Telephone Encounter (Signed)
Patient called checking on the status of her injections.

## 2018-07-12 ENCOUNTER — Ambulatory Visit (INDEPENDENT_AMBULATORY_CARE_PROVIDER_SITE_OTHER): Payer: PRIVATE HEALTH INSURANCE

## 2018-07-12 ENCOUNTER — Other Ambulatory Visit (HOSPITAL_COMMUNITY): Payer: Self-pay | Admitting: Internal Medicine

## 2018-07-12 ENCOUNTER — Telehealth: Payer: Self-pay

## 2018-07-12 DIAGNOSIS — I5022 Chronic systolic (congestive) heart failure: Secondary | ICD-10-CM | POA: Diagnosis not present

## 2018-07-12 DIAGNOSIS — Z95810 Presence of automatic (implantable) cardiac defibrillator: Secondary | ICD-10-CM

## 2018-07-12 NOTE — Telephone Encounter (Signed)
Remote ICM transmission received.  Attempted call to patient and left detailed message, per DPR, to return call regarding transmission.    

## 2018-07-12 NOTE — Progress Notes (Signed)
EPIC Encounter for ICM Monitoring  Patient Name: [name] is a 67 y.o. female Date: 07/12/2018 Primary Care Physican: [name], MD Primary Cardiologist:Bensimhon Electrophysiologist: Allred Dry Weight:unknown Bi-V Pacing: 97%      Attempted call to patient and unable to reach.  Left detailed message, per DPR, regarding transmission and requested a call back.  Transmission reviewed.    Thoracic impedance abnormal suggesting fluid accumulation starting 07/09/2018.  Prescribed dosage:  Furosemide 40 mg 1 tabletthree times a week Monday, Wed and Friday as needed. Per Dr Bensimhon'soffice visit note,06/19/2018,may need to titrate up with Lasix.  Labs: 04/04/2018 Creatinine 1.26, BUN 20, Potassium 4.4, Sodium 139, EGFR 44-51 03/22/2017 Creatinine 1.19, BUN 12, Potassium 3.6, Sodium 139, EGFR 47-54 02/20/2017 Creatinine 1.24, BUN 27, Potassium 3.6, Sodium 138, EGFR 44-51 09/30/2016 Creatinine 1.21, BUN 17, Potassium 3.7, Sodium 139  Recommendations:  NONE - Unable to reach.  Follow-up plan: ICM clinic phone appointment on 07/24/2018 to recheck fluid levels.       Copy of ICM check sent to Dr. [name] and Dr Haroldine Laws for review.   3 month ICM trend: 07/12/2018    1 Year ICM trend:       [name], RN 07/12/2018 11:16 AM

## 2018-07-13 ENCOUNTER — Telehealth (INDEPENDENT_AMBULATORY_CARE_PROVIDER_SITE_OTHER): Payer: Self-pay

## 2018-07-13 NOTE — Telephone Encounter (Signed)
PA required for Orthovisc series, bilateral knee through [facility] Received PA on Wednesday, 07/11/2018 from [facility]. Faxed completed PA form to [facility] at [phone]

## 2018-07-20 ENCOUNTER — Telehealth (INDEPENDENT_AMBULATORY_CARE_PROVIDER_SITE_OTHER): Payer: Self-pay

## 2018-07-20 NOTE — Telephone Encounter (Signed)
Patient is approved for Orthovisc series, bilateral knee. Purchased through [facility] Covered at 100% through [place] $50.00 each visit PA required through [facility] PA Approval# [id number] Approved 07/14/2018- 08/03/2018  Called [facility] and tried to extend PA approval date, but was advised by Joaquim Lai A.that PA date could not be extended and would have to submit a new authorization.  [place] A.that patient will receive Orthovisc series weekly for 3 weeks.  Stated that if patient received injection after expiration date, that they could back date the injection.  Please schedule patient for next week if possible. Thank you

## 2018-07-20 NOTE — Telephone Encounter (Signed)
Talked with patient and advised her that she has been approved for Orthovisc series, bilateral knee.

## 2018-07-23 ENCOUNTER — Telehealth (INDEPENDENT_AMBULATORY_CARE_PROVIDER_SITE_OTHER): Payer: Self-pay

## 2018-07-23 NOTE — Telephone Encounter (Signed)
Talked Caryl Pina with [facility] and gave verbal Rx order for Orthovisc series, bilateral knee.  Stated that they will contact patient for consent to have medication delivered to our office.

## 2018-07-24 ENCOUNTER — Ambulatory Visit (INDEPENDENT_AMBULATORY_CARE_PROVIDER_SITE_OTHER): Payer: PRIVATE HEALTH INSURANCE

## 2018-07-24 DIAGNOSIS — Z95810 Presence of automatic (implantable) cardiac defibrillator: Secondary | ICD-10-CM

## 2018-07-24 DIAGNOSIS — I5022 Chronic systolic (congestive) heart failure: Secondary | ICD-10-CM

## 2018-07-24 NOTE — Progress Notes (Signed)
EPIC Encounter for ICM Monitoring  Patient Name: [name] is a 67 y.o. female Date: 07/24/2018 Primary Care Physican: Grisso, Greg A., MD Primary Cardiologist:Bensimhon Electrophysiologist: Allred Dry Weight:unknown Bi-V Pacing: 97%        Attempted call to patient and unable to reach.  Left detailed message, per DPR, regarding transmission.  Transmission reviewed.    Thoracic impedance returned to normal since last ICM remote transmission on 07/12/18.  Prescribed: Furosemide 40 mg 1 tabletthree times a week Monday, Wed and Friday as needed. Per Dr Bensimhon'soffice visit note,06/19/2018,may need to titrate up with Lasix.  Labs: 04/04/2018 Creatinine 1.26, BUN 20, Potassium 4.4, Sodium 139, EGFR 44-51 03/22/2017 Creatinine 1.19, BUN 12, Potassium 3.6, Sodium 139, EGFR 47-54 02/20/2017 Creatinine 1.24, BUN 27, Potassium 3.6, Sodium 138, EGFR 44-51 09/30/2016 Creatinine 1.21, BUN 17, Potassium 3.7, Sodium 139  Recommendations: Left voice mail with ICM number and encouraged to call if experiencing any fluid symptoms.  Follow-up plan: ICM clinic phone appointment on 08/23/2018.       Copy of ICM check sent to Dr. Allred.   3 month ICM trend: 07/24/2018    1 Year ICM trend:       Laurie S Short, RN 07/24/2018 9:03 AM   

## 2018-07-24 NOTE — Telephone Encounter (Signed)
07/24/18 Left a message for patient to call to discuss injections. 07/23/18 Left a message for patient to call to discuss injections.

## 2018-07-25 ENCOUNTER — Ambulatory Visit (INDEPENDENT_AMBULATORY_CARE_PROVIDER_SITE_OTHER): Payer: PRIVATE HEALTH INSURANCE | Admitting: Orthopaedic Surgery

## 2018-07-25 ENCOUNTER — Encounter (INDEPENDENT_AMBULATORY_CARE_PROVIDER_SITE_OTHER): Payer: Self-pay | Admitting: Orthopaedic Surgery

## 2018-07-25 VITALS — Ht 62.5 in | Wt 246.0 lb

## 2018-07-25 DIAGNOSIS — M1A09X0 Idiopathic chronic gout, multiple sites, without tophus (tophi): Secondary | ICD-10-CM

## 2018-07-25 DIAGNOSIS — M1611 Unilateral primary osteoarthritis, right hip: Secondary | ICD-10-CM | POA: Diagnosis not present

## 2018-07-25 NOTE — Progress Notes (Signed)
Office Visit Note    Patient: [name]           Date of Birth: January 10, 1951           MRN: [id number] Visit Date: 07/25/2018              Requested by: [name], MD [place],  [id number] PCP: [name], MD  Dr. Andrew Au  Assessment & Plan: Visit Diagnoses:  1. Unilateral primary osteoarthritis, right hip   2. Idiopathic chronic gout of multiple sites without tophus     Plan: [facility] with gentle exercise program in the water which should not bother her knees or hip.  She can also work on losing some weight which would help her heart.  If she gets good relief from the gel injections in her knees she can return for gel injection in her hip which I be happy to perform.  No change in her medications.  We reviewed the x-rays and discussed the diagnosis osteoarthritis.  Due to her heart failure she is not a candidate candidate for anesthesia unless it is an emergent surgery.  We discussed the 20% ejection fraction which is stable from transfer from an elective surgery unless it is an emergency/life-threatening.  Follow-Up Instructions: No follow-ups on file.   Orders:  No orders of the defined types were placed in this encounter.  No orders of the defined types were placed in this encounter.     Procedures: No procedures performed   Clinical Data: No additional findings.   Subjective: Chief Complaint  Patient presents with  . Right Hip - Pain    HPI 67 year old female referred by Dr. Hettie Davis for right hip osteoarthritis.  She has knee osteoarthritis is scheduled for repeat gel injection shortly.  She states she has pain in the groin radiates to the mid thigh.  She uses a rolling walker.  History of breast cancer.  Heart failure with defibrillator and ejection fraction 20% which is stable.  She has chronic gout.  Review of Systems systems positive for gout, hypertension, aortic dissection,  chronic heart failure, A. fib, morbid obesity, depression, glaucoma, knee osteoarthritis, right hip osteoarthritis heart failure, cardiomyopathy otherwise negative as it pertains HPI.   Objective: Vital Signs: Ht 5' 2.5" (1.588 m)   Wt 246 lb (111.6 kg)   BMI 44.28 kg/m   Physical Exam  Constitutional: She is oriented to person, place, and time. She appears well-developed.  HENT:  Head: Normocephalic.  Right Ear: External ear normal.  Left Ear: External ear normal.  Eyes: Pupils are equal, round, and reactive to light.  Neck: No tracheal deviation present. No thyromegaly present.  Cardiovascular: Normal rate.  Pulmonary/Chest: Effort normal.  Abdominal: Soft.  Neurological: She is alert and oriented to person, place, and time.  Skin: Skin is warm and dry.  Psychiatric: She has a normal mood and affect. Her behavior is normal.    Ortho Exam patient some me she is in a rolling walker with reverse seat.  Knee crepitus with range of motion pain with internal rotation right hip.  Previous x-rays last year show bone-on-bone changes in the right hip with spurring of the left hip. Specialty Comments:  No specialty comments available.  Imaging: No results found.   PMFS History: Patient Active Problem List   Diagnosis Date Noted  . Encounter for long-term current use of medication 10/05/2016  . Acute pain  of right shoulder 10/05/2016  . Stage 2 chronic kidney disease 10/05/2016  . Malignant neoplasm of right female breast ([place]) 10/05/2016  . Glaucoma of both eyes 10/05/2016  . Anxiety 10/05/2016  . Renal calcinosis 10/05/2016  . Dyslipidemia 10/05/2016  . Osteoarthritis of both knees 10/03/2016  . Idiopathic chronic gout, unspecified site, without tophus (tophi) 10/03/2016  . Unilateral primary osteoarthritis, right hip 10/03/2016  . Hypotension 09/26/2012  . ICD-St.Jude 06/21/2012  . Non-ischemic cardiomyopathy ([place]) 03/27/2012  . A-fib ([place]) 09/20/2011  . Chronic systolic  heart failure ([place])   . OBSTRUCTIVE SLEEP APNEA 10/28/2010  . Secondary cardiomyopathy ([place]) 10/19/2010  . DVT 10/19/2010  . LBBB (left bundle branch block) 08/18/2010  . Morbid (severe) obesity due to excess calories ([place]) 08/17/2010  . DEPRESSION 08/17/2010  . OTHER CHRONIC PAIN 08/17/2010  . GLAUCOMA 08/17/2010  . Essential hypertension 08/17/2010  . Dissection of aorta ([place]) 08/17/2010  . SHINGLES, HX OF 08/17/2010   Past Medical History:  Diagnosis Date  . Aortic dissection ([place]) 12/2008   Type B  . Arthritis    lower back  . Atrial fibrillation ([place])    post op (left thoracotomy) 11/12 req amio/DCCV  . Breast cancer ([place]) 07/2009   Stage 3 lumpectomy, radiation, chemotherapy; finished chemo October, felt to be in remission  . Cardiac defibrillator in place    St. Jude (JA) 9/12; LV lead placement unsuccessful;  s/p left thoracotomy with placement of epicardial LV lead 09/2011 (Dr. PVT)  . CHF (congestive heart failure) ([place])   . Chronic systolic dysfunction of left ventricle   . Depression   . DVT (deep venous thrombosis) ([place]) 10/18/2010  . GERD (gastroesophageal reflux disease)   . Glaucoma   . Hiatal hernia    small hiatal hernia  . History of shingles   . Hyperlipidemia   . Hypertension   . Left bundle branch block   . Nonischemic cardiomyopathy ([place])    Admission 12/11 with EF 25%, normal coronary arteries by cath 12/11; NICM presumed to be secondary to chemotherapy for breast cancer  . Obesity   . [name]    with exertion    Family History  Problem Relation Age of Onset  . Hypertension Mother   . Glaucoma Mother   . Atrial fibrillation Mother   . Hypertension Father   . Hypertension Brother   . Hypertension Maternal Grandfather   . Heart attack Maternal Grandfather        MI  . Coronary artery disease Maternal Grandfather   . Hypertension Paternal Grandfather   . Heart attack Paternal Grandfather        MI  . Coronary artery disease  Paternal Grandfather   . Hypertension Brother     Past Surgical History:  Procedure Laterality Date  . BREAST LUMPECTOMY     Right breast  . CARDIAC CATHETERIZATION     2011  . CARDIAC DEFIBRILLATOR PLACEMENT    . EP IMPLANTABLE DEVICE N/A 01/12/2016   STJ ICD Unify Assura gen change, Dr. [name]  . OOPHORECTOMY     Single  . THORACOTOMY  09/16/2011   Procedure: THORACOTOMY MAJOR;  Surgeon: Tharon Aquas Adelene Idler, MD;  Location: [facility] OR;  Service: Thoracic;  Laterality: Left;  left anterolateral Thoracotomy for placement of St. Jude epicardial pacing lead   . TONSILLECTOMY    . TUBAL LIGATION     Bilateral   Social History   Occupational History  . Occupation: Oceanographer:  [employer]  . Occupation: Retired from Set designer  . Smoking status: Never Smoker  . Smokeless tobacco: Never Used  Substance and Sexual Activity  . Alcohol use: No  . Drug use: No  . Sexual activity: Not on file

## 2018-07-27 NOTE — Telephone Encounter (Signed)
I spoke with patient, and advised call [facility] for authorization to mail Injections to [place].

## 2018-07-30 ENCOUNTER — Telehealth (INDEPENDENT_AMBULATORY_CARE_PROVIDER_SITE_OTHER): Payer: Self-pay

## 2018-07-30 ENCOUNTER — Telehealth (INDEPENDENT_AMBULATORY_CARE_PROVIDER_SITE_OTHER): Payer: Self-pay | Admitting: *Deleted

## 2018-07-30 NOTE — Telephone Encounter (Signed)
Patient is approved for Orthovisc, bilateral knee. A new PA was started on 07/23/2018 for continuation of medication due to not being able to extend 1st PA which, is still effective through  08/03/2018. PA Approval# for 2nd Authorization is [id number] Valid through 08/13/2018

## 2018-07-30 NOTE — Telephone Encounter (Signed)
I called patient, advised patient to call [facility] to have visco shipped to office, patient stated she would call [facility] today. I advised patient we would schedule appointments as soon as visco delivered to office. Patient is aware [facility]co approval will expire 08/13/18.

## 2018-08-03 ENCOUNTER — Telehealth: Payer: Self-pay | Admitting: *Deleted

## 2018-08-03 NOTE — Telephone Encounter (Signed)
I called [facility] at [phone], patient's cost is over $2000 for Orthovisc BIL. Patient has not consented, pending shipment.

## 2018-08-23 ENCOUNTER — Ambulatory Visit (INDEPENDENT_AMBULATORY_CARE_PROVIDER_SITE_OTHER): Payer: PRIVATE HEALTH INSURANCE | Admitting: *Deleted

## 2018-08-23 ENCOUNTER — Ambulatory Visit (INDEPENDENT_AMBULATORY_CARE_PROVIDER_SITE_OTHER): Payer: PRIVATE HEALTH INSURANCE

## 2018-08-23 ENCOUNTER — Telehealth: Payer: Self-pay

## 2018-08-23 DIAGNOSIS — I5022 Chronic systolic (congestive) heart failure: Secondary | ICD-10-CM

## 2018-08-23 DIAGNOSIS — Z95810 Presence of automatic (implantable) cardiac defibrillator: Secondary | ICD-10-CM | POA: Diagnosis not present

## 2018-08-23 DIAGNOSIS — I428 Other cardiomyopathies: Secondary | ICD-10-CM | POA: Diagnosis not present

## 2018-08-23 LAB — CUP PACEART REMOTE DEVICE CHECK
Battery Remaining Percentage: 64 %
Battery Voltage: 2.95 V
Brady Statistic AP VP Percent: 1.8 %
Brady Statistic AP VS Percent: 1 %
Brady Statistic AS VP Percent: 96 %
Brady Statistic AS VS Percent: 1.8 %
Brady Statistic RA Percent Paced: 1.4 %
Date Time Interrogation Session: 20191024060017
HighPow Impedance: 93 Ohm
HighPow Impedance: 93 Ohm
Implantable Lead Implant Date: 20120914
Implantable Lead Location: 753859
Implantable Lead Location: 753860
Implantable Lead Model: 511212
Implantable Lead Serial Number: 194059
Lead Channel Impedance Value: 410 Ohm
Lead Channel Pacing Threshold Pulse Width: 0.5 ms
Lead Channel Pacing Threshold Pulse Width: 0.7 ms
Lead Channel Sensing Intrinsic Amplitude: 1 mV
Lead Channel Setting Pacing Amplitude: 1.75 V
Lead Channel Setting Pacing Amplitude: 2 V
Lead Channel Setting Sensing Sensitivity: 0.5 mV
MDC IDC LEAD IMPLANT DT: 20120914
MDC IDC LEAD IMPLANT DT: 20121116
MDC IDC LEAD LOCATION: 753858
MDC IDC MSMT BATTERY REMAINING LONGEVITY: 50 mo
MDC IDC MSMT LEADCHNL LV IMPEDANCE VALUE: 290 Ohm
MDC IDC MSMT LEADCHNL LV PACING THRESHOLD AMPLITUDE: 2.125 V
MDC IDC MSMT LEADCHNL RA IMPEDANCE VALUE: 400 Ohm
MDC IDC MSMT LEADCHNL RA PACING THRESHOLD AMPLITUDE: 0.75 V
MDC IDC MSMT LEADCHNL RA PACING THRESHOLD PULSEWIDTH: 0.5 ms
MDC IDC MSMT LEADCHNL RV PACING THRESHOLD AMPLITUDE: 1 V
MDC IDC MSMT LEADCHNL RV SENSING INTR AMPL: 10.7 mV
MDC IDC PG IMPLANT DT: 20170314
MDC IDC PG SERIAL: 7328650
MDC IDC SET LEADCHNL LV PACING AMPLITUDE: 2.5 V
MDC IDC SET LEADCHNL LV PACING PULSEWIDTH: 0.5 ms
MDC IDC SET LEADCHNL RV PACING PULSEWIDTH: 0.7 ms

## 2018-08-23 NOTE — Telephone Encounter (Signed)
Remote ICM transmission received.  Attempted call to patient regarding ICM remote transmission and left detailed message, per DPR, with next ICM remote transmission date of 09/24/2018.  Advised to return call for any fluid symptoms or questions.    

## 2018-08-23 NOTE — Progress Notes (Signed)
EPIC Encounter for ICM Monitoring  Patient Name: [name] is a 67 y.o. female Date: 08/23/2018 Primary Care Physican: Grisso, Greg A., MD Primary Cardiologist:Bensimhon Electrophysiologist: Allred Dry Weight:unknown Bi-V Pacing: 97%                Attempted call to patient and unable to reach.  Left detailed message, per DPR, regarding transmission.  Transmission reviewed.    Thoracic impedance returned to normal since last ICM remote transmission on 07/12/18.  Prescribed: Furosemide 40 mg 1 tabletthree times a week Monday, Wed and Friday as needed. Per Dr Bensimhon'soffice visit note,06/19/2018,may need to titrate up with Lasix.  Labs: 04/04/2018 Creatinine 1.26, BUN 20, Potassium 4.4, Sodium 139, EGFR 44-51 03/22/2017 Creatinine 1.19, BUN 12, Potassium 3.6, Sodium 139, EGFR 47-54 02/20/2017 Creatinine 1.24, BUN 27, Potassium 3.6, Sodium 138, EGFR 44-51 09/30/2016 Creatinine 1.21, BUN 17, Potassium 3.7, Sodium 139  Recommendations: Left voice mail with ICM number and encouraged to call if experiencing any fluid symptoms.  Follow-up plan: ICM clinic phone appointment on 09/24/2018.   Office appointment scheduled 09/19/2018 with Dr. Bensimhon.    Copy of ICM check sent to Dr. Allred.   3 month ICM trend: 08/23/2018    1 Year ICM trend:       Laurie S Short, RN 08/23/2018 10:40 AM   

## 2018-08-23 NOTE — Progress Notes (Signed)
Remote ICD transmission.   

## 2018-09-03 ENCOUNTER — Other Ambulatory Visit (HOSPITAL_COMMUNITY): Payer: Self-pay | Admitting: Internal Medicine

## 2018-09-19 ENCOUNTER — Encounter (HOSPITAL_COMMUNITY): Payer: PRIVATE HEALTH INSURANCE | Admitting: Internal Medicine

## 2018-09-24 ENCOUNTER — Ambulatory Visit (INDEPENDENT_AMBULATORY_CARE_PROVIDER_SITE_OTHER): Payer: PRIVATE HEALTH INSURANCE

## 2018-09-24 ENCOUNTER — Telehealth: Payer: Self-pay

## 2018-09-24 DIAGNOSIS — I5022 Chronic systolic (congestive) heart failure: Secondary | ICD-10-CM

## 2018-09-24 DIAGNOSIS — Z95810 Presence of automatic (implantable) cardiac defibrillator: Secondary | ICD-10-CM

## 2018-09-24 NOTE — Progress Notes (Signed)
Attempted call to patient and no message.

## 2018-09-24 NOTE — Progress Notes (Signed)
EPIC Encounter for ICM Monitoring  Patient Name: [name] is a 67 y.o. female Date: 09/24/2018 Primary Care Physican: [name], MD Primary Cardiologist:Bensimhon Electrophysiologist: Allred Bi-V Pacing: 97%  Last Weight: 246 lbs (07/25/2018 office visit) Today's Weight: unknown       Attempted call to patient and unable to reach.  Left message to return call regarding transmission.  Transmission reviewed.    Thoracic impedance abnormal suggesting fluid accumulation starting 09/22/2018.   Prescribed: Furosemide 40 mg 1 tabletthree times a week Monday, Wed and Friday as needed. Per Dr Bensimhon'soffice visit note,06/19/2018,may need to titrate up with Lasix.  Labs: 04/04/2018 Creatinine 1.26, BUN 20, Potassium 4.4, Sodium 139, EGFR 44-51 03/22/2017 Creatinine 1.19, BUN 12, Potassium 3.6, Sodium 139, EGFR 47-54 02/20/2017 Creatinine 1.24, BUN 27, Potassium 3.6, Sodium 138, EGFR 44-51 09/30/2016 Creatinine 1.21, BUN 17, Potassium 3.7, Sodium 139  Recommendations: Unable to reach.  If patient reached, will advise to take Lasix 40 mg 1 tablet x 3 days.  Follow-up plan: ICM clinic phone appointment on 10/04/2018 to recheck fluid levels.     Copy of ICM check sent to Dr. [name] and Dr Haroldine Laws.   3 month ICM trend: 09/24/2018    1 Year ICM trend:       [name], RN 09/24/2018 11:57 AM

## 2018-09-24 NOTE — Telephone Encounter (Signed)
Remote ICM transmission received.  Attempted call to patient regarding ICM remote transmission and left detailed message, per DPR, to return call.    

## 2018-09-26 NOTE — Progress Notes (Deleted)
Office Visit Note  Patient: [name]             Date of Birth: 1951/04/30           MRN: [id number]             PCP: [name], MD Referring: [name], MD Visit Date: 10/10/2018 Occupation: @GUAROCC @  Subjective:  No chief complaint on file.   History of Present Illness: [name] is a 67 y.o. female ***   Activities of Daily Living:  Patient reports morning stiffness for *** {minute/hour:19697}.   Patient {ACTIONS;DENIES/REPORTS:21021675::"Denies"} nocturnal pain.  Difficulty dressing/grooming: {ACTIONS;DENIES/REPORTS:21021675::"Denies"} Difficulty climbing stairs: {ACTIONS;DENIES/REPORTS:21021675::"Denies"} Difficulty getting out of chair: {ACTIONS;DENIES/REPORTS:21021675::"Denies"} Difficulty using hands for taps, buttons, cutlery, and/or writing: {ACTIONS;DENIES/REPORTS:21021675::"Denies"}  No Rheumatology ROS completed.   PMFS History:  Patient Active Problem List   Diagnosis Date Noted  . Encounter for long-term current use of medication 10/05/2016  . Acute pain of right shoulder 10/05/2016  . Stage 2 chronic kidney disease 10/05/2016  . Malignant neoplasm of right female breast ([place]) 10/05/2016  . Glaucoma of both eyes 10/05/2016  . Anxiety 10/05/2016  . Renal calcinosis 10/05/2016  . Dyslipidemia 10/05/2016  . Osteoarthritis of both knees 10/03/2016  . Idiopathic chronic gout, unspecified site, without tophus (tophi) 10/03/2016  . Unilateral primary osteoarthritis, right hip 10/03/2016  . Hypotension 09/26/2012  . ICD-St.Jude 06/21/2012  . Non-ischemic cardiomyopathy ([place]) 03/27/2012  . A-fib ([place]) 09/20/2011  . Chronic systolic heart failure ([place])   . OBSTRUCTIVE SLEEP APNEA 10/28/2010  . Secondary cardiomyopathy ([place]) 10/19/2010  . DVT 10/19/2010  . LBBB (left bundle branch block) 08/18/2010  . Morbid (severe) obesity due to excess calories ([place]) 08/17/2010  . DEPRESSION 08/17/2010  . OTHER CHRONIC PAIN 08/17/2010  . GLAUCOMA  08/17/2010  . Essential hypertension 08/17/2010  . Dissection of aorta ([place]) 08/17/2010  . SHINGLES, HX OF 08/17/2010    Past Medical History:  Diagnosis Date  . 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 Arthritis    lower back  . Atrial fibrillation ([place])    post op (left thoracotomy) 11/12 req amio/DCCV  . Breast cancer ([place]) 07/2009   Stage 3 lumpectomy, radiation, chemotherapy; finished chemo October, felt to be in remission  . Cardiac defibrillator in place    St. Jude (JA) 9/12; LV lead placement unsuccessful;  s/p left thoracotomy with placement of epicardial LV lead 09/2011 (Dr. PVT)  . CHF (congestive heart failure) ([place])   . Chronic systolic dysfunction of left ventricle   . Depression   . DVT (deep venous thrombosis) ([place]) 10/18/2010  . GERD (gastroesophageal reflux disease)   . Glaucoma   . Hiatal hernia    small hiatal hernia  . History of shingles   . Hyperlipidemia   . Hypertension   . Left bundle branch block   . Nonischemic cardiomyopathy ([place])    Admission 12/11 with EF 25%, normal coronary arteries by cath 12/11; NICM presumed to be secondary to chemotherapy for breast cancer  . Obesity   . [name]    with exertion    Family History  Problem Relation Age of Onset  . Hypertension Mother   . Glaucoma Mother   . Atrial fibrillation Mother   . Hypertension Father   . Hypertension Brother   . Hypertension Maternal Grandfather   . Heart attack Maternal Grandfather        MI  . Coronary artery disease Maternal Grandfather   .  Hypertension Paternal Grandfather   . Heart attack Paternal Grandfather        MI  . Coronary artery disease Paternal Grandfather   . Hypertension Brother    Past Surgical History:  Procedure Laterality Date  . BREAST LUMPECTOMY     Right breast  . CARDIAC CATHETERIZATION     2011  . CARDIAC DEFIBRILLATOR PLACEMENT    . EP IMPLANTABLE DEVICE N/A 01/12/2016   STJ ICD Unify Assura gen change, Dr. [name]  .  OOPHORECTOMY     Single  . THORACOTOMY  09/16/2011   Procedure: THORACOTOMY MAJOR;  Surgeon: Tharon Aquas Adelene Idler, MD;  Location: [facility] OR;  Service: Thoracic;  Laterality: Left;  left anterolateral Thoracotomy for placement of St. Jude epicardial pacing lead   . TONSILLECTOMY    . TUBAL LIGATION     Bilateral   Social History   Social History Narrative   Married   One child   Lives in [place] with spouse and mother in law   Previously worked as a Art gallery manager for [employer]      She is a Leisure centre manager counsel member.    Objective: Vital Signs: There were no vitals taken for this visit.   Physical Exam   Musculoskeletal Exam: ***  CDAI Exam: CDAI Score: Not documented Patient Global Assessment: Not documented; Provider Global Assessment: Not documented Swollen: Not documented; Tender: Not documented Joint Exam   Not documented   There is currently no information documented on the homunculus. Go to the Rheumatology activity and complete the homunculus joint exam.  Investigation: No additional findings.  Imaging: No results found.  Recent Labs: Lab Results  Component Value Date   WBC 8.6 04/04/2018   HGB 14.3 04/04/2018   PLT 158 04/04/2018   NA 139 04/04/2018   K 4.4 04/04/2018   CL 102 04/04/2018   CO2 28 04/04/2018   GLUCOSE 99 04/04/2018   BUN 20 04/04/2018   CREATININE 1.26 (H) 04/04/2018   BILITOT 0.6 04/04/2018   ALKPHOS 92 09/19/2011   AST 24 04/04/2018   ALT 15 04/04/2018   PROT 6.5 04/04/2018   ALBUMIN 2.8 (L) 09/19/2011   CALCIUM 9.3 04/04/2018   GFRAA 51 (L) 04/04/2018    Speciality Comments: No specialty comments available.  Procedures:  No procedures performed Allergies: Alphagan [brimonidine]; Mobic [meloxicam]; Nsaids; Polyvinyl alcohol; Statins; Tolmetin; Travatan z [travoprost (bak free)]; and Tape   Assessment / Plan:     Visit Diagnoses: Idiopathic chronic gout of multiple sites without tophus - allopurinol 300 mg daily and  colchicine 0.6 mg as needed.   Primary osteoarthritis of both knees -  Not a surgical candidate per cardiology  Primary osteoarthritis of right hip  Chronic systolic heart failure (HCC)  Non-ischemic cardiomyopathy ([place])  History of chronic kidney disease  Malignant neoplasm of right female breast, unspecified estrogen receptor status, unspecified site of breast ([place])  Dyslipidemia  Renal calcinosis  History of glaucoma  History of anxiety  History of hypertension  History of DVT (deep vein thrombosis)  History of aortic dissection  History of sleep apnea   Orders: No orders of the defined types were placed in this encounter.  No orders of the defined types were placed in this encounter.   Face-to-face time spent with patient was *** minutes. Greater than 50% of time was spent in counseling and coordination of care.  Follow-Up Instructions: No follow-ups on file.   Ofilia Neas, PA-C  Note - This record has  been created using [employer].  Chart creation errors have been sought, but may not always  have been located. Such creation errors do not reflect on  the standard of medical care.

## 2018-10-02 ENCOUNTER — Other Ambulatory Visit (HOSPITAL_COMMUNITY): Payer: Self-pay | Admitting: Internal Medicine

## 2018-10-03 ENCOUNTER — Other Ambulatory Visit (HOSPITAL_COMMUNITY): Payer: Self-pay

## 2018-10-03 MED ORDER — IVABRADINE HCL 5 MG PO TABS: 2.5000 mg | ORAL_TABLET | Freq: Two times a day (BID) | ORAL | 3 refills | Status: DC

## 2018-10-04 ENCOUNTER — Ambulatory Visit (INDEPENDENT_AMBULATORY_CARE_PROVIDER_SITE_OTHER): Payer: PRIVATE HEALTH INSURANCE

## 2018-10-04 DIAGNOSIS — Z95810 Presence of automatic (implantable) cardiac defibrillator: Secondary | ICD-10-CM

## 2018-10-04 DIAGNOSIS — I5022 Chronic systolic (congestive) heart failure: Secondary | ICD-10-CM

## 2018-10-04 NOTE — Progress Notes (Signed)
EPIC Encounter for ICM Monitoring  Patient Name: [name] is a 67 y.o. female Date: 10/04/2018 Primary Care Physican: [name], MD Primary Cardiologist:Bensimhon Electrophysiologist: Allred Bi-V Pacing: 97%        Last Weight: 246 lbs (07/25/2018 office visit) Today's Weight: unknown       Attempted call to patient and unable to reach.  Left detailed message, per DPR, regarding transmission.  Transmission reviewed.    Thoracic impedance returned to normal since last ICM remote transmission 09/24/2018.    Prescribed: Furosemide 40 mg 1 tabletthree times a week Monday, Wed and Friday as needed. Per Dr Bensimhon'soffice visit note,06/19/2018,may need to titrate up with Lasix.  Labs: 04/04/2018 Creatinine 1.26, BUN 20, Potassium 4.4, Sodium 139, EGFR 44-51 03/22/2017 Creatinine 1.19, BUN 12, Potassium 3.6, Sodium 139, EGFR 47-54 02/20/2017 Creatinine 1.24, BUN 27, Potassium 3.6, Sodium 138, EGFR 44-51 09/30/2016 Creatinine 1.21, BUN 17, Potassium 3.7, Sodium 139  Recommendations: Left voice mail with ICM number and encouraged to call if experiencing any fluid symptoms.  Follow-up plan: ICM clinic phone appointment on 10/25/2018.   Office appointment scheduled 10/23/2018 with Dr. Haroldine Laws.    Copy of ICM check sent to Dr. [name].   3 month ICM trend: 10/04/2018    1 Year ICM trend:       [name], RN 10/04/2018 12:08 PM

## 2018-10-08 ENCOUNTER — Telehealth (HOSPITAL_COMMUNITY): Payer: Self-pay | Admitting: Pharmacist

## 2018-10-08 NOTE — Telephone Encounter (Signed)
[name] 5 mg BID PA approved by [employer] through 10/04/19.  Ruta Hinds. Velva Harman, PharmD, BCPS, CPP Clinical Pharmacist Phone: [phone] 10/08/2018 11:19 AM

## 2018-10-10 ENCOUNTER — Ambulatory Visit: Payer: PRIVATE HEALTH INSURANCE | Admitting: Physician Assistant

## 2018-10-23 ENCOUNTER — Encounter (HOSPITAL_COMMUNITY): Payer: Self-pay | Admitting: Internal Medicine

## 2018-10-23 ENCOUNTER — Ambulatory Visit (HOSPITAL_COMMUNITY)
Admission: RE | Admit: 2018-10-23 | Discharge: 2018-10-23 | Disposition: A | Discharge status: Home / Self Care | Payer: PRIVATE HEALTH INSURANCE | Source: Ambulatory Visit | Attending: Internal Medicine | Admitting: Internal Medicine

## 2018-10-23 VITALS — BP 100/60 | HR 78 | Wt 248.8 lb

## 2018-10-23 DIAGNOSIS — I480 Paroxysmal atrial fibrillation: Secondary | ICD-10-CM | POA: Insufficient documentation

## 2018-10-23 DIAGNOSIS — M170 Bilateral primary osteoarthritis of knee: Secondary | ICD-10-CM | POA: Diagnosis not present

## 2018-10-23 DIAGNOSIS — I10 Essential (primary) hypertension: Secondary | ICD-10-CM

## 2018-10-23 DIAGNOSIS — E669 Obesity, unspecified: Secondary | ICD-10-CM | POA: Insufficient documentation

## 2018-10-23 DIAGNOSIS — I7100 Dissection of unspecified site of aorta: Secondary | ICD-10-CM | POA: Insufficient documentation

## 2018-10-23 DIAGNOSIS — Z9889 Other specified postprocedural states: Secondary | ICD-10-CM | POA: Diagnosis not present

## 2018-10-23 DIAGNOSIS — G4733 Obstructive sleep apnea (adult) (pediatric): Secondary | ICD-10-CM | POA: Insufficient documentation

## 2018-10-23 DIAGNOSIS — I5022 Chronic systolic (congestive) heart failure: Secondary | ICD-10-CM | POA: Insufficient documentation

## 2018-10-23 DIAGNOSIS — H409 Unspecified glaucoma: Secondary | ICD-10-CM | POA: Insufficient documentation

## 2018-10-23 DIAGNOSIS — I428 Other cardiomyopathies: Secondary | ICD-10-CM | POA: Insufficient documentation

## 2018-10-23 DIAGNOSIS — I110 Hypertensive heart disease with heart failure: Secondary | ICD-10-CM | POA: Insufficient documentation

## 2018-10-23 DIAGNOSIS — K219 Gastro-esophageal reflux disease without esophagitis: Secondary | ICD-10-CM | POA: Diagnosis not present

## 2018-10-23 DIAGNOSIS — Z7901 Long term (current) use of anticoagulants: Secondary | ICD-10-CM | POA: Insufficient documentation

## 2018-10-23 DIAGNOSIS — I447 Left bundle-branch block, unspecified: Secondary | ICD-10-CM | POA: Diagnosis not present

## 2018-10-23 DIAGNOSIS — M1990 Unspecified osteoarthritis, unspecified site: Secondary | ICD-10-CM | POA: Insufficient documentation

## 2018-10-23 DIAGNOSIS — F329 Major depressive disorder, single episode, unspecified: Secondary | ICD-10-CM | POA: Insufficient documentation

## 2018-10-23 DIAGNOSIS — Z95810 Presence of automatic (implantable) cardiac defibrillator: Secondary | ICD-10-CM | POA: Insufficient documentation

## 2018-10-23 DIAGNOSIS — Z853 Personal history of malignant neoplasm of breast: Secondary | ICD-10-CM | POA: Diagnosis not present

## 2018-10-23 DIAGNOSIS — Z79899 Other long term (current) drug therapy: Secondary | ICD-10-CM | POA: Insufficient documentation

## 2018-10-23 DIAGNOSIS — Z7982 Long term (current) use of aspirin: Secondary | ICD-10-CM | POA: Insufficient documentation

## 2018-10-23 DIAGNOSIS — Z86718 Personal history of other venous thrombosis and embolism: Secondary | ICD-10-CM | POA: Insufficient documentation

## 2018-10-23 NOTE — Progress Notes (Signed)
ADVANCED HF CLINIC NOTE   Patient ID: [name], female   DOB: Oct 29, 1951, 67 y.o.   MRN: [id number] Oncologist: Dr. Philipp Ovens at [facility] EP: Dr. [name] Dr. Bea Graff in [place] HF: Bensimhon  HPI: [name] is a 67 y.o. female a cardiac history that includes HTN, Type B aortic dissection (2010) managed with medical therapy by Dr. Prescott Gum and has healed, LBBB, systolic heart failure due to NICM with EF 10-15% in May 2013 which has decreased from 65% in 2010.  Cath Dec 2011 showed normal arteries.  She was diagnosed with breast cancer in 2009 s/p lumpectomy, XRT, and chemotherapy.  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   She underwent St Jude Bi-V ICD implantation Sept 2012 but unable to place LV lead.  She eventually brought back in Nov 2012 for left thoracotomy and placement of epicardial LV lead placement by Dr. Prescott Gum.  She also had a sleep studythat showed mild OSA.  No clinical intervention occurred.    She presents today for regular follow up. At last visit, corlanor cut back to 2.5 mg BID with occasional bradycardia. Doing well overall. Remains SOB walking up an incline. Her main problems is her arthritis in her knees and hips. BP has no longer been dropping, and HR has been stable around 75 with decrease of corlanor. Watching fluid and salt in take. Denies orthopnea or PND. No CP. No dizziness or lightheadedness. Mild SOB on flat ground if she gets in a hurry. She has started to use the pool at the [facility].   Echo 12/2017 LVEF 20%, RV ok.  ICD: Thoracic impedence above threshold, had large dip in October, PT activity 2+ hrs a day, BiV 97%, AT/AF burden < 1%, personally reviewed   10/10/2012 CPX Peak VO2: 10.6 % predicted peak VO2: 64.4% VE/VCO2 slope: 26.9 OUES: 1.18 Peak RER: 1.29 Ventilatory Threshold: 7.3 % predicted peak VO2: 44.4% Peak RR 23 Peak Ventilation: 29.8 VE/MVV: 32.6% PETCO2 at peak:  43  01/2014 CPX Peak VO2: 10.3 (66.4% predicted peak VO2) VE/VCO2 slope: 25.0 OUES: 1.30 Peak RER: 1.20  CPX (8/16): Peak VO2 10.1 VE/VCO2 slope 28.4 RER 1.14 OUES 1.3 PFTs mildly restrictive No significant change compared to prior.  Limited more by obesity and restrictive lung physiology than heart (mild circulatory limitation).   ECHO 01/02/13 EF 29% Grade 1 diastolic dysfunction. RV ok. No PAH ECHO 07/10/13 EF 20% RV ok ECHO 10/13/14 EF 15-20% RV ok Echo 10/2015 EF 15%  Echo 09/30/16 LVEF 10-15%, Grade 1 DD, mild PI, Mild TR. Normal RV.  Review of systems complete and found to be negative unless listed in HPI.      Past Medical History:  Diagnosis Date  . Aortic dissection ([place]) 12/2008   Type B  . Arthritis    lower back  . Atrial fibrillation ([place])    post op (left thoracotomy) 11/12 req amio/DCCV  . Breast cancer ([place]) 07/2009   Stage 3 lumpectomy, radiation, chemotherapy; finished chemo October, felt to be in remission  . Cardiac defibrillator in place    St. Jude (JA) 9/12; LV lead placement unsuccessful;  s/p left thoracotomy with placement of epicardial LV lead 09/2011 (Dr. PVT)  . CHF (congestive heart failure) ([place])   . Chronic systolic dysfunction of left ventricle   . Depression   . DVT (deep venous thrombosis) ([place]) 10/18/2010  . GERD (gastroesophageal reflux disease)   . Glaucoma   .  Hiatal hernia    small hiatal hernia  . History of shingles   . Hyperlipidemia   . Hypertension   . Left bundle branch block   . Nonischemic cardiomyopathy ([place])    Admission 12/11 with EF 25%, normal coronary arteries by cath 12/11; NICM presumed to be secondary to chemotherapy for breast cancer  . Obesity   . [name]    with exertion   Current Outpatient Medications  Medication Sig Dispense Refill  . albuterol (PROVENTIL HFA;VENTOLIN HFA) 108 (90 BASE) MCG/ACT inhaler Inhale 2 puffs into the lungs every 4 (four) hours as needed. [name]     .  albuterol (PROVENTIL) (2.5 MG/3ML) 0.083% nebulizer solution Take 2.5 mg by nebulization every 4 (four) hours as needed. [name]     . alendronate (FOSAMAX) 70 MG tablet Take 70 mg by mouth once a week. Take with a full glass of water on an empty stomach. Takes on Monday    . allopurinol (ZYLOPRIM) 300 MG tablet Take 1 tablet (300 mg total) by mouth daily. 90 tablet 1  . ALPRAZolam (XANAX) 1 MG tablet Take 0.5 mg by mouth daily as needed for anxiety. Anxiety     . aspirin 81 MG EC tablet Take 81 mg by mouth daily.      [name] azelastine (OPTIVAR) 0.05 % ophthalmic solution Place 1 drop into both eyes daily.    . Bepotastine Besilate (BEPREVE) 1.5 % SOLN Place 1 drop into both eyes daily as needed. Dry eyes     . bimatoprost (LUMIGAN) 0.03 % ophthalmic solution Place 1 drop into both eyes at bedtime. Reported on 01/12/2016    . Calcium Carbonate-Vitamin D (CALCIUM-VITAMIN D3) 600-200 MG-UNIT TABS Take 1 tablet by mouth 2 (two) times daily.      . carvedilol (COREG) 12.5 MG tablet Take 12.5 mg by mouth 2 (two) times daily with a meal.    . cholecalciferol (VITAMIN D) 1000 UNITS tablet Take 1,000 Units by mouth 2 (two) times daily.      [name] COLCRYS 0.6 MG tablet TAKE ONE TABLET BY MOUTH EVERY DAY 30 tablet 1  . cyclobenzaprine (FLEXERIL) 10 MG tablet Take 1 tablet (10 mg total) by mouth 3 (three) times daily as needed for muscle spasms. 30 tablet 0  . diclofenac sodium (VOLTAREN) 1 % GEL APPLY THREE GRAMS TO THREE LARGE JOINTS UP TO THREE TIMES DAILY AS NEEDED 3 Tube 3  . docusate sodium (COLACE) 100 MG capsule Take 100 mg by mouth 2 (two) times daily.    [name] ENTRESTO 49-51 MG TAKE ONE TABLET BY MOUTH TWICE DAILY 60 tablet 3  . fexofenadine (ALLEGRA) 180 MG tablet Take 180 mg by mouth daily as needed. Allergies     . fluticasone (FLONASE) 50 MCG/ACT nasal spray Place 2 sprays into the nose daily as needed. Allergies     . furosemide (LASIX) 40 MG tablet Take 1 tablet (40 mg total) by mouth 3  (three) times a week. Monday, Wednesday and Friday and as needed 90 tablet 3  . furosemide (LASIX) 40 MG tablet TAKE ONE TABLET BY MOUTH DAILY 90 tablet 1  . HYDROcodone-acetaminophen (NORCO/VICODIN) 5-325 MG tablet TAKE ONE TABLET BY MOUTH EVERY 6 HOURS PRN    . ivabradine ([name]) 5 MG TABS tablet Take 0.5 tablets (2.5 mg total) by mouth 2 (two) times daily. 60 tablet 3  . letrozole (FEMARA) 2.5 MG tablet Take 2.5 mg by mouth every evening.      [name]  montelukast (SINGULAIR) 10 MG tablet Take 10 mg by mouth at bedtime. For allergies & congestion    . Multiple Vitamin (MULTIVITAMIN) tablet Take 1 tablet by mouth daily.      [name] 20 MG capsule Take 20 mg by mouth 2 (two) times daily.      [name] PARoxetine (PAXIL) 20 MG tablet Take 20 mg by mouth daily.  1  . Polyethyl Glycol-Propyl Glycol (SYSTANE OP) Apply 1 drop to eye daily as needed (dry eyes).     [name] senna-docusate (SENOKOT-S) 8.6-50 MG per tablet Take 1 tablet by mouth daily as needed for mild constipation.     [name] spironolactone (ALDACTONE) 25 MG tablet Take 1 tablet (25 mg total) by mouth daily. 90 tablet 3  . traMADol (ULTRAM) 50 MG tablet Take 50 mg by mouth every 6 (six) hours as needed for pain.     No current facility-administered medications for this encounter.    PHYSICAL EXAM: Vitals:   10/23/18 1046  BP: 100/60  Pulse: 78  SpO2: 95%  Weight: 112.9 kg (248 lb 12.8 oz)   Wt Readings from Last 3 Encounters:  10/23/18 112.9 kg (248 lb 12.8 oz)  07/25/18 111.6 kg (246 lb)  06/19/18 111.8 kg (246 lb 6 oz)   Physical Exam  General: Obese. NAD.  HEENT: Normal anicteric  Neck: Supple. JVP 6-7 cm. Carotids 2+ bilat; no bruits. No thyromegaly or nodule noted. Cor: PMI nondisplaced. RRR, No M/G/R noted Lungs: CTAB, normal effort. Abdomen: Obese Soft, non-tender, non-distended, no HSM. No bruits or masses. +BS  Extremities: no cyanosis, clubbing, rash, edema Neuro: alert & oriented x 3, cranial nerves grossly  intact. moves all 4 extremities w/o difficulty. Affect pleasant  ASSESSMENT & PLAN: 1. Chronic systolic CHF: Nonischemic cardiomyopathy.  Echo 09/30/16 LVEF 10-15%, Grade 1 DD, mild PI, Mild TR. Normal RV. s/p St Jude Bi-V ICD with epicardial LV lead. - Echo 12/2017 EF 20% stable. (Personally reviewed.) She is stable, so will plan repeat q 2 years.  - CPX in 8/16 was similar to the past: it appeared that the major limitation was from body habitus/deconditioning and not cardiac - NYHA III symptoms, confounded by debilitating arthritis.  - Volume status stable on exam and corevue.   - Continue Entresto 49/51 mg BID.  - Continue lasix 40 mg M/W/F. Can take extra as needed.  - Continue coreg 12.5 mg BID.  - Continue spironolactone 25 daily. Creatinine stable 1.26, K 4.4 03/2018 - Continue corlanor 2.5 bid - Reinforced fluid restriction to < 2 L daily, sodium restriction to less than 2000 mg daily, and the importance of daily weights.   - Consider repeating CPX. Not sure if she would qualify for advanced therapies with her comorbidities.  2. Paroxysmal atrial fibrillation:  - This was post-op.  - NSR on exam. No AT/AF by corevue.   - Continue ASA 81 mg daily.  - If recurs, will need antiocoag with CHA2DS2/VAS of 4.  3. Type B aortic dissection:  - Stable. BP stable.  4. HTN  - Stable on meds as above.  5. Arthritis - Knees and Hips. Not candidate for surgery with LV dysfunction.  - Continue PT/Pool  Doing well overall. RTC 4 months.   [name], PA-C 10/23/2018   Patient seen and examined with the above-signed Advanced Practice Provider and/or Housestaff. I personally reviewed laboratory data, imaging studies and relevant notes. I independently examined the patient and formulated the important aspects of the plan. I  have edited the note to reflect any of my changes or salient points. I have personally discussed the plan with the patient and/or family.  She continues to do well  from cardiac perspective despite very low EF. Volume status looks good. Main limitation currently is her knee arthritis. Has seen Ortho but not candidate for TKA due to low EF. Continue current regimen. ICD interrogated personally in clinic. No VT/AF. Volume status ok.   Glori Bickers, MD  9:14 PM

## 2018-10-23 NOTE — Patient Instructions (Signed)
Your physician recommends that you schedule a follow-up appointment in: 4 months. Office will call you to schedule your appointment.  Do the following things EVERYDAY: 1) Weigh yourself in the morning before breakfast. Write it down and keep it in a log. 2) Take your medicines as prescribed 3) Eat low salt foods-Limit salt (sodium) to 2000 mg per day.  4) Stay as active as you can everyday 5) Limit all fluids for the day to less than 2 liters

## 2018-10-25 ENCOUNTER — Ambulatory Visit (INDEPENDENT_AMBULATORY_CARE_PROVIDER_SITE_OTHER): Payer: PRIVATE HEALTH INSURANCE

## 2018-10-25 ENCOUNTER — Telehealth: Payer: Self-pay

## 2018-10-25 DIAGNOSIS — Z95810 Presence of automatic (implantable) cardiac defibrillator: Secondary | ICD-10-CM

## 2018-10-25 DIAGNOSIS — I5022 Chronic systolic (congestive) heart failure: Secondary | ICD-10-CM

## 2018-10-25 NOTE — Telephone Encounter (Signed)
Remote ICM transmission received.  Attempted call to patient regarding ICM remote transmission and left detailed message, per DPR, with next ICM remote transmission date of 11/26/2018.  Advised to return call for any fluid symptoms or questions.

## 2018-10-25 NOTE — Progress Notes (Signed)
EPIC Encounter for ICM Monitoring  Patient Name: [name] is a 67 y.o. female Date: 10/25/2018 Primary Care Physican: Grisso, Greg A., MD Primary Cardiologist:Bensimhon Electrophysiologist: Allred Bi-V Pacing: 97% Last Weight:248 lbs (10/23/2018 office visit) Today's Weight: unknown                                                   Attempted call to patient and unable to reach.  Left detailed message, per DPR, regarding transmission.  Transmission reviewed.    Thoracic impedance abnormal suggesting the start of fluid accumulation 10/24/2018  Prescribed: Furosemide 40 mg 1 tabletthree times a week Monday, Wed and Friday and can take extra as needed per Dr Bensimhon'soffice visit note,10/23/2018.  Labs: 04/04/2018 Creatinine 1.26, BUN 20, Potassium 4.4, Sodium 139, EGFR 44-51 03/22/2017 Creatinine 1.19, BUN 12, Potassium 3.6, Sodium 139, EGFR 47-54 02/20/2017 Creatinine 1.24, BUN 27, Potassium 3.6, Sodium 138, EGFR 44-51 09/30/2016 Creatinine 1.21, BUN 17, Potassium 3.7, Sodium 139  Recommendations: Left voice mail with ICM number and encouraged to call if experiencing any fluid symptoms.  Follow-up plan: ICM clinic phone appointment on 11/26/2018  Copy of ICM check sent to Dr. Allred.   3 month ICM trend: 10/25/2018    1 Year ICM trend:       Laurie S Short, RN 10/25/2018 11:12 AM   

## 2018-12-03 NOTE — Progress Notes (Signed)
No ICM remote transmission received for 11/26/2018 and next ICM transmission scheduled for 12/11/2018.

## 2018-12-10 ENCOUNTER — Encounter: Payer: Self-pay | Admitting: Cardiology

## 2018-12-11 ENCOUNTER — Ambulatory Visit (INDEPENDENT_AMBULATORY_CARE_PROVIDER_SITE_OTHER): Payer: PRIVATE HEALTH INSURANCE

## 2018-12-11 DIAGNOSIS — Z95810 Presence of automatic (implantable) cardiac defibrillator: Secondary | ICD-10-CM | POA: Diagnosis not present

## 2018-12-11 DIAGNOSIS — I5022 Chronic systolic (congestive) heart failure: Secondary | ICD-10-CM

## 2018-12-11 NOTE — Progress Notes (Signed)
EPIC Encounter for ICM Monitoring  Patient Name: [name] is a 68 y.o. female Date: 12/11/2018 Primary Care Physican: [name], MD Primary Cardiologist:Bensimhon Electrophysiologist: Allred Bi-V Pacing: 97% Last OMVEHM:094 lbs (10/23/2018 office visit) Today's Weight: unknown   Heart failure questions reviewed. She was vacationing at [employer] and felt a little ankle stiffness indicating to her that she had fluid.  She took extra Furosemide during decreased impedance which resolved fluid accumulation.   Thoracic impedance normal.  Prescribed:Furosemide 40 mg 1 tabletthree times a week Monday, Wed and Friday and can take extra as needed per Dr Bensimhon'soffice visit note,10/23/2018.  Labs: 04/04/2018 Creatinine 1.26, BUN 20, Potassium 4.4, Sodium 139, EGFR 44-51 03/22/2017 Creatinine 1.19, BUN 12, Potassium 3.6, Sodium 139, EGFR 47-54 02/20/2017 Creatinine 1.24, BUN 27, Potassium 3.6, Sodium 138, EGFR 44-51 09/30/2016 Creatinine 1.21, BUN 17, Potassium 3.7, Sodium 139  Recommendations: No changes and encouraged to call for fluid symptoms.   Follow-up plan: ICM clinic phone appointment on3/16/2020  Copy of ICM check sent to Dr.Allred.   3 month ICM trend: 12/11/2018    1 Year ICM trend:       [name], RN 12/11/2018 12:10 PM

## 2018-12-12 ENCOUNTER — Ambulatory Visit (INDEPENDENT_AMBULATORY_CARE_PROVIDER_SITE_OTHER): Payer: PRIVATE HEALTH INSURANCE

## 2018-12-12 DIAGNOSIS — I5022 Chronic systolic (congestive) heart failure: Secondary | ICD-10-CM

## 2018-12-12 DIAGNOSIS — I428 Other cardiomyopathies: Secondary | ICD-10-CM

## 2018-12-14 LAB — CUP PACEART REMOTE DEVICE CHECK
Battery Remaining Longevity: 47 mo
Battery Remaining Percentage: 60 %
Battery Voltage: 2.95 V
Brady Statistic AP VP Percent: 2.6 %
Brady Statistic AP VS Percent: 1 %
Brady Statistic AS VP Percent: 94 %
Brady Statistic AS VS Percent: 2.1 %
Brady Statistic RA Percent Paced: 2 %
Date Time Interrogation Session: 20200213141159
HighPow Impedance: 100 Ohm
HighPow Impedance: 100 Ohm
Implantable Lead Implant Date: 20120914
Implantable Lead Implant Date: 20120914
Implantable Lead Implant Date: 20121116
Implantable Lead Location: 753858
Implantable Lead Location: 753859
Implantable Lead Location: 753860
Implantable Lead Model: 511212
Lead Channel Impedance Value: 280 Ohm
Lead Channel Impedance Value: 390 Ohm
Lead Channel Impedance Value: 390 Ohm
Lead Channel Pacing Threshold Amplitude: 1 V
Lead Channel Pacing Threshold Amplitude: 1.875 V
Lead Channel Pacing Threshold Pulse Width: 0.5 ms
Lead Channel Pacing Threshold Pulse Width: 0.5 ms
Lead Channel Pacing Threshold Pulse Width: 0.7 ms
Lead Channel Sensing Intrinsic Amplitude: 1 mV
Lead Channel Sensing Intrinsic Amplitude: 11.7 mV
Lead Channel Setting Pacing Amplitude: 1.75 V
Lead Channel Setting Pacing Amplitude: 2 V
Lead Channel Setting Pacing Amplitude: 2.5 V
Lead Channel Setting Pacing Pulse Width: 0.5 ms
Lead Channel Setting Pacing Pulse Width: 0.7 ms
Lead Channel Setting Sensing Sensitivity: 0.5 mV
MDC IDC LEAD SERIAL: 194059
MDC IDC MSMT LEADCHNL RA PACING THRESHOLD AMPLITUDE: 0.75 V
MDC IDC PG IMPLANT DT: 20170314
Pulse Gen Serial Number: 7328650

## 2018-12-24 NOTE — Progress Notes (Signed)
Remote ICD transmission.   

## 2019-01-08 ENCOUNTER — Other Ambulatory Visit (HOSPITAL_COMMUNITY): Payer: Self-pay | Admitting: Internal Medicine

## 2019-01-14 ENCOUNTER — Other Ambulatory Visit: Payer: Self-pay

## 2019-01-14 ENCOUNTER — Ambulatory Visit (INDEPENDENT_AMBULATORY_CARE_PROVIDER_SITE_OTHER): Payer: PRIVATE HEALTH INSURANCE

## 2019-01-14 DIAGNOSIS — Z95810 Presence of automatic (implantable) cardiac defibrillator: Secondary | ICD-10-CM

## 2019-01-14 DIAGNOSIS — I5022 Chronic systolic (congestive) heart failure: Secondary | ICD-10-CM

## 2019-01-14 NOTE — Progress Notes (Signed)
EPIC Encounter for ICM Monitoring  Patient Name: [name] is a 68 y.o. female Date: 01/14/2019 Primary Care Physican: [name], MD Primary Cardiologist:Bensimhon Electrophysiologist: Allred Bi-V Pacing: 97% Last Weight:248lbs (10/23/2018 office visit) Today's Weight: unknown   Attempted call to patient and unable to reach.  Transmission reviewed.   Thoracic impedanceabnormal suggesting fluid accumulation starting 01/13/2019.  Prescribed:Furosemide 40 mg 1 tabletthree times a week Monday, Wed and Fridayandas needed.  Labs: 04/04/2018 Creatinine 1.26, BUN 20, Potassium 4.4, Sodium 139, EGFR 44-51 03/22/2017 Creatinine 1.19, BUN 12, Potassium 3.6, Sodium 139, EGFR 47-54 02/20/2017 Creatinine 1.24, BUN 27, Potassium 3.6, Sodium 138, EGFR 44-51  Recommendations:Unable to reach.  Follow-up plan: ICM clinic phone appointment on3/24/2020 to recheck fluid levels.  Copy of ICM check sent to Dr.Allred and Dr Vaughan Browner.   3 month ICM trend: 01/14/2019    1 Year ICM trend:       [name], RN 01/14/2019 10:25 AM

## 2019-01-15 ENCOUNTER — Telehealth: Payer: Self-pay

## 2019-01-15 NOTE — Telephone Encounter (Signed)
Remote ICM transmission received.  Attempted call to patient regarding ICM remote transmission and mail box full 

## 2019-01-22 ENCOUNTER — Ambulatory Visit (INDEPENDENT_AMBULATORY_CARE_PROVIDER_SITE_OTHER): Payer: PRIVATE HEALTH INSURANCE

## 2019-01-22 ENCOUNTER — Other Ambulatory Visit: Payer: Self-pay

## 2019-01-22 DIAGNOSIS — Z95810 Presence of automatic (implantable) cardiac defibrillator: Secondary | ICD-10-CM

## 2019-01-22 DIAGNOSIS — I5022 Chronic systolic (congestive) heart failure: Secondary | ICD-10-CM

## 2019-01-25 ENCOUNTER — Telehealth (HOSPITAL_COMMUNITY): Payer: Self-pay

## 2019-01-25 ENCOUNTER — Telehealth: Payer: Self-pay

## 2019-01-25 NOTE — Telephone Encounter (Signed)
Remote ICM transmission received.  Attempted call to patient regarding ICM remote transmission and mail box is full. 

## 2019-01-25 NOTE — Telephone Encounter (Signed)
Received fax from [facility] to renew PA for Entresto 24/26mg  however patients dose is 49/51mg .  Previous PA good until 02/21/2019.

## 2019-01-25 NOTE — Progress Notes (Signed)
EPIC Encounter for ICM Monitoring  Patient Name: RISE TRAEGER is a 68 y.o. female Date: 01/25/2019 Primary Care Physican: [name], MD Primary Cardiologist:Bensimhon Electrophysiologist: Allred Bi-V Pacing: 97% Last Weight:248lbs (10/23/2018 office visit) Today's Weight: unknown   Attempted call to patient and unable to reach.  Transmission reviewed.   Thoracic impedancereturned to normal.  Prescribed:Furosemide 40 mg 1 tabletthree times a week Monday, Wed and Fridayandas needed.  Labs: 04/04/2018 Creatinine 1.26, BUN 20, Potassium 4.4, Sodium 139, EGFR 44-51 03/22/2017 Creatinine 1.19, BUN 12, Potassium 3.6, Sodium 139, EGFR 47-54 02/20/2017 Creatinine 1.24, BUN 27, Potassium 3.6, Sodium 138, EGFR 44-51  Recommendations:Unable to reach.  Follow-up plan: ICM clinic phone appointment on4/20/2020.  Copy of ICM check sent to Dr.Allred and Dr Vaughan Browner.   3 month ICM trend: 01/25/2019    1 Year ICM trend:       [name], RN 01/25/2019 5:43 PM

## 2019-02-07 ENCOUNTER — Other Ambulatory Visit (HOSPITAL_COMMUNITY): Payer: Self-pay | Admitting: Internal Medicine

## 2019-02-18 ENCOUNTER — Other Ambulatory Visit: Payer: Self-pay

## 2019-02-22 ENCOUNTER — Telehealth: Payer: Self-pay

## 2019-02-22 NOTE — Telephone Encounter (Signed)
Spoke with patient to remind of missed remote transmission 

## 2019-02-25 NOTE — Progress Notes (Signed)
No ICM remote transmission received for 02/18/2019 and next ICM transmission scheduled for 03/12/2019.

## 2019-03-08 ENCOUNTER — Other Ambulatory Visit: Payer: Self-pay | Admitting: Physician Assistant

## 2019-03-08 NOTE — Telephone Encounter (Signed)
Patient due for follow up visit and labs.

## 2019-03-12 ENCOUNTER — Ambulatory Visit (INDEPENDENT_AMBULATORY_CARE_PROVIDER_SITE_OTHER): Payer: PRIVATE HEALTH INSURANCE

## 2019-03-12 ENCOUNTER — Other Ambulatory Visit: Payer: Self-pay

## 2019-03-12 DIAGNOSIS — I5022 Chronic systolic (congestive) heart failure: Secondary | ICD-10-CM

## 2019-03-12 DIAGNOSIS — Z95810 Presence of automatic (implantable) cardiac defibrillator: Secondary | ICD-10-CM

## 2019-03-13 ENCOUNTER — Ambulatory Visit (INDEPENDENT_AMBULATORY_CARE_PROVIDER_SITE_OTHER): Payer: PRIVATE HEALTH INSURANCE | Admitting: *Deleted

## 2019-03-13 ENCOUNTER — Other Ambulatory Visit: Payer: Self-pay

## 2019-03-13 DIAGNOSIS — I428 Other cardiomyopathies: Secondary | ICD-10-CM

## 2019-03-13 DIAGNOSIS — I5022 Chronic systolic (congestive) heart failure: Secondary | ICD-10-CM

## 2019-03-14 LAB — CUP PACEART REMOTE DEVICE CHECK
Battery Remaining Longevity: 44 mo
Brady Statistic RA Percent Paced: 2.4 %
Brady Statistic RV Percent Paced: 97 %
Date Time Interrogation Session: 20200514095152
HighPow Impedance: 93 Ohm
Implantable Lead Implant Date: 20120914
Implantable Lead Implant Date: 20120914
Implantable Lead Implant Date: 20121116
Implantable Lead Location: 753858
Implantable Lead Location: 753859
Implantable Lead Location: 753860
Implantable Lead Model: 511212
Implantable Lead Serial Number: 194059
Implantable Pulse Generator Implant Date: 20170314
Lead Channel Impedance Value: 290 Ohm
Lead Channel Impedance Value: 410 Ohm
Lead Channel Impedance Value: 430 Ohm
Lead Channel Pacing Threshold Amplitude: 0.75 V
Lead Channel Pacing Threshold Amplitude: 1 V
Lead Channel Pacing Threshold Amplitude: 1.75 V
Lead Channel Pacing Threshold Pulse Width: 0.5 ms
Lead Channel Pacing Threshold Pulse Width: 0.5 ms
Lead Channel Pacing Threshold Pulse Width: 0.7 ms
Lead Channel Sensing Intrinsic Amplitude: 0.6 mV
Lead Channel Sensing Intrinsic Amplitude: 11.7 mV
Lead Channel Setting Pacing Amplitude: 1.75 V
Lead Channel Setting Pacing Amplitude: 2 V
Lead Channel Setting Pacing Amplitude: 2.5 V
Lead Channel Setting Pacing Pulse Width: 0.5 ms
Lead Channel Setting Pacing Pulse Width: 0.7 ms
Lead Channel Setting Sensing Sensitivity: 0.5 mV
Pulse Gen Serial Number: 7328650

## 2019-03-15 NOTE — Progress Notes (Signed)
EPIC Encounter for ICM Monitoring  Patient Name: [name] is a 68 y.o. female Date: 03/15/2019 Primary Care Physican: [name], MD Primary Cardiologist:Bensimhon Electrophysiologist: Allred Bi-V Pacing: 97% Last Weight:248lbs (10/23/2018 office visit)    Transmission reviewed.   Thoracic impedancenormal but was decreased suggesting fluid accumulation from 03/01/2019 - 03/07/2019.  Prescribed:Furosemide 40 mg 1 tabletthree times a week Monday, Wed and Fridayandas needed.  Labs: 04/04/2018 Creatinine 1.26, BUN 20, Potassium 4.4, Sodium 139, EGFR 44-51 03/22/2017 Creatinine 1.19, BUN 12, Potassium 3.6, Sodium 139, EGFR 47-54 02/20/2017 Creatinine 1.24, BUN 27, Potassium 3.6, Sodium 138, EGFR 44-51  Recommendations:None  Follow-up plan: ICM clinic phone appointment on6/15/2020.  Copy of ICM check sent to Dr.Allred  3 month ICM trend: 03/12/2019    1 Year ICM trend:       [name], RN 03/15/2019 1:07 PM

## 2019-03-29 NOTE — Progress Notes (Signed)
Remote ICD transmission.   

## 2019-04-08 ENCOUNTER — Other Ambulatory Visit (HOSPITAL_COMMUNITY): Payer: Self-pay | Admitting: Internal Medicine

## 2019-04-15 ENCOUNTER — Ambulatory Visit (INDEPENDENT_AMBULATORY_CARE_PROVIDER_SITE_OTHER): Payer: PRIVATE HEALTH INSURANCE

## 2019-04-15 DIAGNOSIS — I5022 Chronic systolic (congestive) heart failure: Secondary | ICD-10-CM | POA: Diagnosis not present

## 2019-04-15 DIAGNOSIS — Z95810 Presence of automatic (implantable) cardiac defibrillator: Secondary | ICD-10-CM | POA: Diagnosis not present

## 2019-04-17 NOTE — Progress Notes (Signed)
EPIC Encounter for ICM Monitoring  Patient Name: JENELL DOBRANSKY is a 68 y.o. female Date: 04/17/2019 Primary Care Physican: [name], MD Primary Cardiologist:Bensimhon Electrophysiologist: Allred Bi-V Pacing: 97% Last Weight:248lbs (10/23/2018 office visit)    **Transmission reviewed.   [place]vue thoracic impedancenormal.  Prescribed:Furosemide 40 mg 1 tabletthree times a week Monday, Wed and Fridayandas needed.  Labs: 04/04/2018 Creatinine 1.26, BUN 20, Potassium 4.4, Sodium 139, EGFR 44-51 03/22/2017 Creatinine 1.19, BUN 12, Potassium 3.6, Sodium 139, EGFR 47-54 02/20/2017 Creatinine 1.24, BUN 27, Potassium 3.6, Sodium 138, EGFR 44-51  Recommendations:None  Follow-up plan: ICM clinic phone appointment on7/20/2020.   Recall was scheduled for 01/2019 with Dr Kendrick Ranch and due to schedule with Tommye Standard, PA in July.   Copy of ICM check sent to Dr.Allred  3 month ICM trend: 04/15/2019    1 Year ICM trend:       [name], RN 04/17/2019 8:15 AM

## 2019-05-08 ENCOUNTER — Other Ambulatory Visit (HOSPITAL_COMMUNITY): Payer: Self-pay | Admitting: Internal Medicine

## 2019-05-20 ENCOUNTER — Ambulatory Visit (INDEPENDENT_AMBULATORY_CARE_PROVIDER_SITE_OTHER): Payer: PRIVATE HEALTH INSURANCE

## 2019-05-20 DIAGNOSIS — Z95810 Presence of automatic (implantable) cardiac defibrillator: Secondary | ICD-10-CM | POA: Diagnosis not present

## 2019-05-20 DIAGNOSIS — I5022 Chronic systolic (congestive) heart failure: Secondary | ICD-10-CM

## 2019-05-21 NOTE — Progress Notes (Signed)
EPIC Encounter for ICM Monitoring  Patient Name: [name] is a 68 y.o. female Date: 05/21/2019 Primary Care Physican: [name], MD Primary Cardiologist:Bensimhon Electrophysiologist: Allred Bi-V Pacing: 97% Last Weight:248lbs    Attempted call to patient and unable to reach.  Left detailed message per DPR regarding transmission. Transmission reviewed.   Corvue thoracic impedancenormal.  Prescribed:Furosemide 40 mg 1 tabletthree times a week Monday, Wed and Fridayandas needed.  Labs: 04/04/2018 Creatinine 1.26, BUN 20, Potassium 4.4, Sodium 139, EGFR 44-51 03/22/2017 Creatinine 1.19, BUN 12, Potassium 3.6, Sodium 139, EGFR 47-54 02/20/2017 Creatinine 1.24, BUN 27, Potassium 3.6, Sodium 138, EGFR 44-51  Recommendations: *Left voice mail with ICM number and encouraged to call if experiencing any fluid symptoms.  Follow-up plan: ICM clinic phone appointment on8/31/2020.   Office appointment with Dr [name] 05/29/2019.   Copy of ICM check sent to Dr.Allred   3 month ICM trend: 05/20/2019    1 Year ICM trend:       [name], RN 05/21/2019 11:28 AM

## 2019-05-29 ENCOUNTER — Telehealth: Payer: Self-pay

## 2019-05-29 ENCOUNTER — Telehealth (INDEPENDENT_AMBULATORY_CARE_PROVIDER_SITE_OTHER): Payer: PRIVATE HEALTH INSURANCE | Admitting: Internal Medicine

## 2019-05-29 ENCOUNTER — Other Ambulatory Visit: Payer: Self-pay

## 2019-05-29 VITALS — BP 119/66 | HR 60 | Wt 263.4 lb

## 2019-05-29 DIAGNOSIS — I5022 Chronic systolic (congestive) heart failure: Secondary | ICD-10-CM | POA: Diagnosis not present

## 2019-05-29 DIAGNOSIS — I10 Essential (primary) hypertension: Secondary | ICD-10-CM | POA: Diagnosis not present

## 2019-05-29 DIAGNOSIS — I480 Paroxysmal atrial fibrillation: Secondary | ICD-10-CM

## 2019-05-29 NOTE — Progress Notes (Signed)
Electrophysiology TeleHealth Note   Due to national recommendations of social distancing due to COVID 19, an audio/video telehealth visit is felt to be most appropriate for this patient at this time.  See MyChart message from today for the patient's consent to telehealth for [facility].   Date:  05/29/2019   ID:  [name], DOB 12/10/1950, MRN [id number]  Location: patient's home  Provider location:  [facility]  Evaluation Performed: Follow-up visit  PCP:  [name], MD   Electrophysiologist:  Dr [name]  Chief Complaint:  ICD follow up  History of Present Illness:    [name] is a 68 y.o. female who presents via telehealth conferencing today.  Since last being seen in our clinic, the patient reports doing reasonably well.  Today, she denies symptoms of palpitations, chest pain, shortness of breath (above baseline),  lower extremity edema, dizziness, presyncope, or syncope.  The patient is otherwise without complaint today.  The patient denies symptoms of fevers, chills, cough, or new SOB worrisome for COVID 19.  Past Medical History:  Diagnosis Date  . 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 Arthritis    lower back  . Atrial fibrillation ([place])    post op (left thoracotomy) 11/12 req amio/DCCV  . Breast cancer ([place]) 07/2009   Stage 3 lumpectomy, radiation, chemotherapy; finished chemo October, felt to be in remission  . Cardiac defibrillator in place    St. Jude (JA) 9/12; LV lead placement unsuccessful;  s/p left thoracotomy with placement of epicardial LV lead 09/2011 (Dr. PVT)  . CHF (congestive heart failure) ([place])   . Chronic systolic dysfunction of left ventricle   . Depression   . DVT (deep venous thrombosis) ([place]) 10/18/2010  . GERD (gastroesophageal reflux disease)   . Glaucoma   . Hiatal hernia    small hiatal hernia  . History of shingles   . Hyperlipidemia   . Hypertension   . Left bundle branch block   . Nonischemic  cardiomyopathy ([place])    Admission 12/11 with EF 25%, normal coronary arteries by cath 12/11; NICM presumed to be secondary to chemotherapy for breast cancer  . Obesity   . [name]    with exertion    Past Surgical History:  Procedure Laterality Date  . BREAST LUMPECTOMY     Right breast  . CARDIAC CATHETERIZATION     2011  . CARDIAC DEFIBRILLATOR PLACEMENT    . EP IMPLANTABLE DEVICE N/A 01/12/2016   STJ ICD Unify Assura gen change, Dr. [name]  . OOPHORECTOMY     Single  . THORACOTOMY  09/16/2011   Procedure: THORACOTOMY MAJOR;  Surgeon: Tharon Aquas Adelene Idler, MD;  Location: [facility] OR;  Service: Thoracic;  Laterality: Left;  left anterolateral Thoracotomy for placement of St. Jude epicardial pacing lead   . TONSILLECTOMY    . TUBAL LIGATION     Bilateral    Current Outpatient Medications  Medication Sig Dispense Refill  . albuterol (PROVENTIL HFA;VENTOLIN HFA) 108 (90 BASE) MCG/ACT inhaler Inhale 2 puffs into the lungs every 4 (four) hours as needed. [name]     . albuterol (PROVENTIL) (2.5 MG/3ML) 0.083% nebulizer solution Take 2.5 mg by nebulization every 4 (four) hours as needed. [name]     . alendronate (FOSAMAX) 70 MG tablet Take 70 mg by mouth once a week. Take with a full glass of water on an empty stomach. Takes on Monday    .  allopurinol (ZYLOPRIM) 300 MG tablet Take 1 tablet (300 mg total) by mouth daily. 90 tablet 1  . ALPRAZolam (XANAX) 1 MG tablet Take 0.5 mg by mouth daily as needed for anxiety. Anxiety     . aspirin 81 MG EC tablet Take 81 mg by mouth daily.      . Aspirin Buf,CaCarb-MgCarb-MgO, 81 MG TABS Take 1 tablet by mouth daily.    [name] azelastine (OPTIVAR) 0.05 % ophthalmic solution Place 1 drop into both eyes daily.    . Bepotastine Besilate (BEPREVE) 1.5 % SOLN Place 1 drop into both eyes daily as needed. Dry eyes     . bimatoprost (LUMIGAN) 0.03 % ophthalmic solution Place 1 drop into both eyes at bedtime. Reported on  01/12/2016    . Calcium Carbonate-Vitamin D (CALCIUM-VITAMIN D3) 600-200 MG-UNIT TABS Take 1 tablet by mouth 2 (two) times daily.      . carvedilol (COREG) 12.5 MG tablet Take 12.5 mg by mouth 2 (two) times daily with a meal.    . cetirizine (ZYRTEC) 10 MG tablet Take 1 tablet by mouth daily.    . cholecalciferol (VITAMIN D) 1000 UNITS tablet Take 1,000 Units by mouth 2 (two) times daily.      [name] COLCRYS 0.6 MG tablet TAKE ONE TABLET BY MOUTH EVERY DAY 30 tablet 1  . cyclobenzaprine (FLEXERIL) 10 MG tablet Take 1 tablet (10 mg total) by mouth 3 (three) times daily as needed for muscle spasms. 30 tablet 0  . diclofenac sodium (VOLTAREN) 1 % GEL APPLY THREE GRAMS TO THREE LARGE JOINTS UP TO THREE TIMES DAILY AS NEEDED 3 Tube 3  . docusate sodium (COLACE) 100 MG capsule Take 100 mg by mouth 2 (two) times daily.    [name] ENTRESTO 49-51 MG TAKE ONE TABLET BY MOUTH TWICE DAILY 60 tablet 0  . fexofenadine (ALLEGRA) 180 MG tablet Take 180 mg by mouth daily as needed. Allergies     . fluticasone (FLONASE) 50 MCG/ACT nasal spray Place 2 sprays into the nose daily as needed. Allergies     . furosemide (LASIX) 40 MG tablet Take 1 tablet (40 mg total) by mouth 3 (three) times a week. Monday, Wednesday and Friday and as needed 90 tablet 3  . furosemide (LASIX) 40 MG tablet TAKE ONE TABLET BY MOUTH DAILY 90 tablet 1  . HYDROcodone-acetaminophen (NORCO/VICODIN) 5-325 MG tablet TAKE ONE TABLET BY MOUTH EVERY 6 HOURS PRN    . ivabradine ([name]) 5 MG TABS tablet Take 0.5 tablets (2.5 mg total) by mouth 2 (two) times daily. 60 tablet 3  . letrozole (FEMARA) 2.5 MG tablet Take 2.5 mg by mouth every evening.      . montelukast (SINGULAIR) 10 MG tablet Take 10 mg by mouth at bedtime. For allergies & congestion    . Multiple Vitamin (MULTIVITAMIN) tablet Take 1 tablet by mouth daily.      . Nutritional Supplements (WELLNESS ESSENTIALS PO) Take 4 tablets by mouth daily.    [name] 20 MG capsule Take 20 mg  by mouth 2 (two) times daily.      [name] omeprazole-sodium bicarbonate (ZEGERID) 40-1100 MG capsule Take 1 capsule by mouth daily.    [name] PARoxetine (PAXIL) 20 MG tablet Take 20 mg by mouth daily.  1  . Polyethyl Glycol-Propyl Glycol (SYSTANE OP) Apply 1 drop to eye daily as needed (dry eyes).     [name] senna-docusate (SENOKOT-S) 8.6-50 MG per tablet Take 1 tablet by mouth daily as needed for mild constipation.     [name]  spironolactone (ALDACTONE) 25 MG tablet TAKE ONE TABLET BY MOUTH daily 90 tablet 3  . traMADol (ULTRAM) 50 MG tablet Take 50 mg by mouth every 6 (six) hours as needed for pain.     No current facility-administered medications for this visit.     Allergies:   Alphagan [brimonidine], Mobic [meloxicam], Nsaids, Polyvinyl alcohol, Statins, Tolmetin, Travatan z [travoprost (bak free)], and Tape   Social History:  The patient  reports that she has never smoked. She has never used smokeless tobacco. She reports that she does not drink alcohol or use drugs.   Family History:  The patient's  family history includes Atrial fibrillation in her mother; Coronary artery disease in her maternal grandfather and paternal grandfather; Glaucoma in her mother; Heart attack in her maternal grandfather and paternal grandfather; Hypertension in her brother, brother, father, maternal grandfather, mother, and paternal grandfather.   ROS:  Please see the history of present illness.   All other systems are personally reviewed and negative.    Exam:    Vital Signs:  BP 119/66   Pulse 60   Wt 263 lb 6.4 oz (119.5 kg)   BMI 47.41 kg/m   Well sounding   Labs/Other Tests and Data Reviewed:    Recent Labs: No results found for requested labs within last 8760 hours.   Wt Readings from Last 3 Encounters:  05/29/19 263 lb 6.4 oz (119.5 kg)  10/23/18 248 lb 12.8 oz (112.9 kg)  07/25/18 246 lb (111.6 kg)     Last device remote is reviewed from [place] PDF which reveals normal device function, no arrhythmias      ASSESSMENT & PLAN:    1.  Chronic systolic heart failure Stable by symptoms Followed by AHF clinic Continue current therapy Normal device function by last remote (see [facility] report) Continue follow up in ICM clinic   2.  Paroxysmal atrial fibrillation No recurrence by device interrogation  3.  HTN Stable No change required today  4.  Type B aortic dissection BP controlled     Follow-up:  [place], ICM clinic, EP PA in 1 year    Patient Risk:  after full review of this patients clinical status, I feel that they are at moderate risk at this time.  Today, I have spent 15 minutes with the patient with telehealth technology discussing arrhythmia management .    Army Fossa, MD  05/29/2019 3:25 PM     [place] [address] [place] [address] [phone] (office) [phone] (fax)

## 2019-06-10 ENCOUNTER — Other Ambulatory Visit (HOSPITAL_COMMUNITY): Payer: Self-pay | Admitting: Internal Medicine

## 2019-06-12 ENCOUNTER — Encounter: Payer: PRIVATE HEALTH INSURANCE | Admitting: *Deleted

## 2019-06-12 LAB — CUP PACEART REMOTE DEVICE CHECK
Battery Remaining Longevity: 43 mo
Battery Remaining Percentage: 53 %
Battery Voltage: 2.95 V
Brady Statistic AP VP Percent: 3.6 %
Brady Statistic AP VS Percent: 1 %
Brady Statistic AS VP Percent: 93 %
Brady Statistic AS VS Percent: 2.2 %
Brady Statistic RA Percent Paced: 2.9 %
Date Time Interrogation Session: 20200812060016
HighPow Impedance: 92 Ohm
HighPow Impedance: 92 Ohm
Implantable Lead Implant Date: 20120914
Implantable Lead Implant Date: 20120914
Implantable Lead Implant Date: 20121116
Implantable Lead Location: 753858
Implantable Lead Location: 753859
Implantable Lead Location: 753860
Implantable Lead Model: 511212
Implantable Lead Serial Number: 194059
Implantable Pulse Generator Implant Date: 20170314
Lead Channel Impedance Value: 280 Ohm
Lead Channel Impedance Value: 410 Ohm
Lead Channel Impedance Value: 450 Ohm
Lead Channel Pacing Threshold Amplitude: 0.75 V
Lead Channel Pacing Threshold Amplitude: 1.125 V
Lead Channel Pacing Threshold Amplitude: 1.625 V
Lead Channel Pacing Threshold Pulse Width: 0.5 ms
Lead Channel Pacing Threshold Pulse Width: 0.5 ms
Lead Channel Pacing Threshold Pulse Width: 0.7 ms
Lead Channel Sensing Intrinsic Amplitude: 0.8 mV
Lead Channel Sensing Intrinsic Amplitude: 11.7 mV
Lead Channel Setting Pacing Amplitude: 1.75 V
Lead Channel Setting Pacing Amplitude: 2.125
Lead Channel Setting Pacing Amplitude: 2.5 V
Lead Channel Setting Pacing Pulse Width: 0.5 ms
Lead Channel Setting Pacing Pulse Width: 0.7 ms
Lead Channel Setting Sensing Sensitivity: 0.5 mV
Pulse Gen Serial Number: 7328650

## 2019-07-01 ENCOUNTER — Ambulatory Visit (INDEPENDENT_AMBULATORY_CARE_PROVIDER_SITE_OTHER): Payer: PRIVATE HEALTH INSURANCE

## 2019-07-01 DIAGNOSIS — I5022 Chronic systolic (congestive) heart failure: Secondary | ICD-10-CM

## 2019-07-01 DIAGNOSIS — Z95810 Presence of automatic (implantable) cardiac defibrillator: Secondary | ICD-10-CM | POA: Diagnosis not present

## 2019-07-02 NOTE — Progress Notes (Signed)
EPIC Encounter for ICM Monitoring  Patient Name: CHARISSA KNOWLES is a 68 y.o. female Date: 07/02/2019 Primary Care Physican: [name], MD Primary Cardiologist:Bensimhon Electrophysiologist: Allred Bi-V Pacing: 97% Last Weight:248lbs       Spoke with patient.   She said she is feeling fine.    Corvue thoracic impedancenormal.  Prescribed:Furosemide 40 mg 1 tabletthree times a week Monday, Wed and Fridayandas needed.  Labs: 04/04/2018 Creatinine 1.26, BUN 20, Potassium 4.4, Sodium 139, EGFR 44-51 03/22/2017 Creatinine 1.19, BUN 12, Potassium 3.6, Sodium 139, EGFR 47-54 02/20/2017 Creatinine 1.24, BUN 27, Potassium 3.6, Sodium 138, EGFR 44-51  Recommendations: No changes and encouraged to call if experiencing any fluid symptoms.  Follow-up plan: ICM clinic phone appointment on 08/19/2019.   91 day device clinic remote transmission due in November 2020.  Office appt 07/24/2019 with Dr. Haroldine Laws.    Copy of ICM check sent to Dr. [name].   3 month ICM trend: 07/01/2019    1 Year ICM trend:       [name], RN 07/02/2019 9:43 AM

## 2019-07-23 NOTE — Progress Notes (Signed)
ADVANCED HF CLINIC NOTE   Patient ID: [name], female   DOB: 09-25-1951, 68 y.o.   MRN: [id number] Oncologist: Dr. Philipp Ovens at [facility] EP: Dr. [name] Dr. Bea Graff in [place] HF: Brynnleigh Mcelwee  HPI: [name] is a 68 y.o. woman with a history of HTN, Type B aortic dissection (2010) managed with medical therapy by Dr. Prescott Gum and has healed, LBBB, systolic heart failure due to NICM with EF 10-15% Cath Dec 2011 showed normal arteries.    She was diagnosed with breast cancer in 2009 s/p lumpectomy, XRT, and chemotherapy.  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   She underwent St Jude Bi-V ICD implantation Sept 2012 but unable to place LV lead.  She eventually brought back in Nov 2012 for left thoracotomy and placement of epicardial LV lead placement by Dr. Prescott Gum.  She also had a sleep study that showed mild OSA.  No clinical intervention occurred.    She presents today for regular follow up. Previously corlanor cut back to 2.5 mg BID with occasional bradycardia. Her main complaint continue to be arthritis in her right knee and hip. Now using a walker. Using a walker or electric WC to get around.  Denies SOB, orthopnea. Has been having LE edema. Has gained 22 pounds with Covid. Takes extra diuretics as needed. SBP 98-105. Occasional dizziness when standing.   Echo 12/2017 LVEF 20%, RV ok.    10/10/2012 CPX Peak VO2: 10.6 % predicted peak VO2: 64.4% VE/VCO2 slope: 26.9 OUES: 1.18 Peak RER: 1.29 Ventilatory Threshold: 7.3 % predicted peak VO2: 44.4% Peak RR 23 Peak Ventilation: 29.8 VE/MVV: 32.6% PETCO2 at peak: 43  01/2014 CPX Peak VO2: 10.3 (66.4% predicted peak VO2) VE/VCO2 slope: 25.0 OUES: 1.30 Peak RER: 1.20  CPX (8/16): Peak VO2 10.1 VE/VCO2 slope 28.4 RER 1.14 OUES 1.3 PFTs mildly restrictive No significant change compared to prior.  Limited more by obesity and restrictive lung physiology than  heart (mild circulatory limitation).   ECHO 01/02/13 EF 0000000 Grade 1 diastolic dysfunction. RV ok. No PAH ECHO 07/10/13 EF 20% RV ok ECHO 10/13/14 EF 15-20% RV ok Echo 10/2015 EF 15%  Echo 09/30/16 LVEF 10-15%, Grade 1 DD, mild PI, Mild TR. Normal RV.  Review of systems complete and found to be negative unless listed in HPI.      Past Medical History:  Diagnosis Date  . Aortic dissection ([place]) 12/2008   Type B  . Arthritis    lower back  . Atrial fibrillation ([place])    post op (left thoracotomy) 11/12 req amio/DCCV  . Breast cancer ([place]) 07/2009   Stage 3 lumpectomy, radiation, chemotherapy; finished chemo October, felt to be in remission  . Cardiac defibrillator in place    St. Jude (JA) 9/12; LV lead placement unsuccessful;  s/p left thoracotomy with placement of epicardial LV lead 09/2011 (Dr. PVT)  . CHF (congestive heart failure) ([place])   . Chronic systolic dysfunction of left ventricle   . Depression   . DVT (deep venous thrombosis) ([place]) 10/18/2010  . GERD (gastroesophageal reflux disease)   . Glaucoma   . Hiatal hernia    small hiatal hernia  . History of shingles   . Hyperlipidemia   . Hypertension   . Left bundle branch block   . Nonischemic cardiomyopathy ([place])    Admission 12/11 with EF 25%, normal coronary arteries by cath 12/11; NICM presumed to be secondary to chemotherapy for breast  cancer  . Obesity   . [name]    with exertion   Current Outpatient Medications  Medication Sig Dispense Refill  . albuterol (PROVENTIL HFA;VENTOLIN HFA) 108 (90 BASE) MCG/ACT inhaler Inhale 2 puffs into the lungs every 4 (four) hours as needed. [name]     . albuterol (PROVENTIL) (2.5 MG/3ML) 0.083% nebulizer solution Take 2.5 mg by nebulization every 4 (four) hours as needed. [name]     . allopurinol (ZYLOPRIM) 300 MG tablet Take 1 tablet (300 mg total) by mouth daily. 90 tablet 1  . ALPRAZolam (XANAX) 1 MG tablet Take 0.5 mg by mouth daily as  needed for anxiety. Anxiety     . aspirin 81 MG EC tablet Take 81 mg by mouth daily.      [name] azelastine (OPTIVAR) 0.05 % ophthalmic solution Place 1 drop into both eyes daily.    . Bepotastine Besilate (BEPREVE) 1.5 % SOLN Place 1 drop into both eyes daily as needed. Dry eyes     . bimatoprost (LUMIGAN) 0.03 % ophthalmic solution Place 1 drop into both eyes at bedtime. Reported on 01/12/2016    . Calcium Carbonate-Vitamin D (CALCIUM-VITAMIN D3) 600-200 MG-UNIT TABS Take 1 tablet by mouth 2 (two) times daily.      . carvedilol (COREG) 12.5 MG tablet Take 12.5 mg by mouth 2 (two) times daily with a meal.    . cetirizine (ZYRTEC) 10 MG tablet Take 1 tablet by mouth daily.    . cholecalciferol (VITAMIN D) 1000 UNITS tablet Take 1,000 Units by mouth 2 (two) times daily.      [name] COLCRYS 0.6 MG tablet TAKE ONE TABLET BY MOUTH EVERY DAY 30 tablet 1  . cyclobenzaprine (FLEXERIL) 10 MG tablet Take 1 tablet (10 mg total) by mouth 3 (three) times daily as needed for muscle spasms. 30 tablet 0  . diclofenac sodium (VOLTAREN) 1 % GEL APPLY THREE GRAMS TO THREE LARGE JOINTS UP TO THREE TIMES DAILY AS NEEDED 3 Tube 3  . docusate sodium (COLACE) 100 MG capsule Take 100 mg by mouth 2 (two) times daily.    [name] ENTRESTO 49-51 MG TAKE ONE TABLET BY MOUTH TWICE DAILY 60 tablet 1  . fexofenadine (ALLEGRA) 180 MG tablet Take 180 mg by mouth daily as needed. Allergies     . furosemide (LASIX) 40 MG tablet Take 1 tablet (40 mg total) by mouth 3 (three) times a week. Monday, Wednesday and Friday and as needed 90 tablet 3  . HYDROcodone-acetaminophen (NORCO/VICODIN) 5-325 MG tablet TAKE ONE TABLET BY MOUTH EVERY 6 HOURS PRN    . ivabradine ([name]) 5 MG TABS tablet Take 0.5 tablets (2.5 mg total) by mouth 2 (two) times daily. 60 tablet 3  . montelukast (SINGULAIR) 10 MG tablet Take 10 mg by mouth at bedtime. For allergies & congestion    . Multiple Vitamin (MULTIVITAMIN) tablet Take 1 tablet by mouth daily.      .  Nutritional Supplements (WELLNESS ESSENTIALS PO) Take 4 tablets by mouth daily.    [name] omeprazole-sodium bicarbonate (ZEGERID) 40-1100 MG capsule Take 1 capsule by mouth daily.    [name] PARoxetine (PAXIL) 20 MG tablet Take 20 mg by mouth daily.  1  . Polyethyl Glycol-Propyl Glycol (SYSTANE OP) Apply 1 drop to eye daily as needed (dry eyes).     [name] spironolactone (ALDACTONE) 25 MG tablet TAKE ONE TABLET BY MOUTH daily 90 tablet 3  . traMADol (ULTRAM) 50 MG tablet Take 50 mg by  mouth every 6 (six) hours as needed for pain.    [name] alendronate (FOSAMAX) 70 MG tablet Take 70 mg by mouth once a week. Take with a full glass of water on an empty stomach. Takes on Monday    . Aspirin Buf,CaCarb-MgCarb-MgO, 81 MG TABS Take 1 tablet by mouth daily.    . furosemide (LASIX) 40 MG tablet TAKE ONE TABLET BY MOUTH DAILY (Patient not taking: Reported on 07/24/2019) 90 tablet 1   No current facility-administered medications for this encounter.    PHYSICAL EXAM: Vitals:   07/24/19 1409  BP: (!) 110/58  Pulse: 74  SpO2: 99%  Weight: 122.6 kg (270 lb 3.2 oz)   Wt Readings from Last 3 Encounters:  07/24/19 122.6 kg (270 lb 3.2 oz)  05/29/19 119.5 kg (263 lb 6.4 oz)  10/23/18 112.9 kg (248 lb 12.8 oz)   Physical Exam  General:  Obese woman. Walks with cane No resp difficulty HEENT: normal Neck: supple. no obvious JVD. Carotids 2+ bilat; no bruits. No lymphadenopathy or thryomegaly appreciated. Cor: PMI nondisplaced. Regular rate & rhythm. No rubs, gallops or murmurs. Lungs: clear Abdomen: obese soft, nontender, nondistended. No hepatosplenomegaly. No bruits or masses. Good bowel sounds. Extremities: no cyanosis, clubbing, rash, 1+ ankle edema Neuro: alert & orientedx3, cranial nerves grossly intact. moves all 4 extremities w/o difficulty. Affect pleasant   ASSESSMENT & PLAN: 1. Chronic systolic CHF: Nonischemic cardiomyopathy.  Echo 09/30/16 LVEF 10-15%, Grade 1 DD, mild PI, Mild TR. Normal RV. s/p St Jude Bi-V  ICD with epicardial LV lead. - Echo 12/2017 EF 20% stable. (Personally reviewed.) She is stable, so will plan repeat q 2 years.  - CPX in 8/16 was similar to the past: it appeared that the major limitation was from body habitus/deconditioning and not cardiac - Stable NYHA III symptoms, confounded by debilitating arthritis.  - Volume status stable on exam despite weight gain. - Continue Entresto 49/51 mg BID. Unable to titrate due to low BP - Continue lasix 40 mg M/W/F. Can take extra as needed.  - Continue coreg 12.5 mg BID.  - Continue spironolactone 25 daily.  - Continue corlanor 2.5 bid - BP too low for SGLT2i - Had labs today at Dr. Willette Pa office. I will review. - Reinforced fluid restriction to < 2 L daily, sodium restriction to less than 2000 mg daily, and the importance of daily weights.   - Consider repeating CPX. Not sure if she would qualify for advanced therapies with her comorbidities.  2. Paroxysmal atrial fibrillation:  - This was post-op.  - Regular on exam   - Continue ASA 81 mg daily.  - If recurs, will need antiocoag with CHA2DS2/VAS of 4.  3. Type B aortic dissection:  - Stable. BP stable.  4. HTN  - BP runs low.  5. Arthritis - Knees and Hips. Not candidate for surgery with severe LV dysfunction.  - Encouraged exercise and weight los 6. HL  - Recent labs  9/20: TC 238 TG 157 HDL 37 LDL 186 - Off statin due to myalgias - Try crestor 5   Labs 07/23/19 reviewed in Care Everywhere   HgbA1c 5.8%  K 3.8 Cr 1.12   Glori Bickers, MD 07/24/2019

## 2019-07-24 ENCOUNTER — Other Ambulatory Visit: Payer: Self-pay

## 2019-07-24 ENCOUNTER — Ambulatory Visit (HOSPITAL_COMMUNITY)
Admission: RE | Admit: 2019-07-24 | Discharge: 2019-07-24 | Disposition: A | Discharge status: Home / Self Care | Payer: PRIVATE HEALTH INSURANCE | Source: Ambulatory Visit | Attending: Internal Medicine | Admitting: Internal Medicine

## 2019-07-24 ENCOUNTER — Encounter (HOSPITAL_COMMUNITY): Payer: Self-pay | Admitting: Internal Medicine

## 2019-07-24 VITALS — BP 110/58 | HR 74 | Wt 270.2 lb

## 2019-07-24 DIAGNOSIS — I428 Other cardiomyopathies: Secondary | ICD-10-CM | POA: Insufficient documentation

## 2019-07-24 DIAGNOSIS — F329 Major depressive disorder, single episode, unspecified: Secondary | ICD-10-CM | POA: Diagnosis not present

## 2019-07-24 DIAGNOSIS — I480 Paroxysmal atrial fibrillation: Secondary | ICD-10-CM

## 2019-07-24 DIAGNOSIS — E669 Obesity, unspecified: Secondary | ICD-10-CM | POA: Diagnosis not present

## 2019-07-24 DIAGNOSIS — I110 Hypertensive heart disease with heart failure: Secondary | ICD-10-CM | POA: Insufficient documentation

## 2019-07-24 DIAGNOSIS — H409 Unspecified glaucoma: Secondary | ICD-10-CM | POA: Insufficient documentation

## 2019-07-24 DIAGNOSIS — M1711 Unilateral primary osteoarthritis, right knee: Secondary | ICD-10-CM | POA: Diagnosis not present

## 2019-07-24 DIAGNOSIS — E782 Mixed hyperlipidemia: Secondary | ICD-10-CM

## 2019-07-24 DIAGNOSIS — G4733 Obstructive sleep apnea (adult) (pediatric): Secondary | ICD-10-CM | POA: Diagnosis not present

## 2019-07-24 DIAGNOSIS — Z9012 Acquired absence of left breast and nipple: Secondary | ICD-10-CM | POA: Insufficient documentation

## 2019-07-24 DIAGNOSIS — K219 Gastro-esophageal reflux disease without esophagitis: Secondary | ICD-10-CM | POA: Diagnosis not present

## 2019-07-24 DIAGNOSIS — Z79899 Other long term (current) drug therapy: Secondary | ICD-10-CM | POA: Insufficient documentation

## 2019-07-24 DIAGNOSIS — M1610 Unilateral primary osteoarthritis, unspecified hip: Secondary | ICD-10-CM | POA: Insufficient documentation

## 2019-07-24 DIAGNOSIS — I5022 Chronic systolic (congestive) heart failure: Secondary | ICD-10-CM | POA: Insufficient documentation

## 2019-07-24 DIAGNOSIS — Z95810 Presence of automatic (implantable) cardiac defibrillator: Secondary | ICD-10-CM | POA: Diagnosis not present

## 2019-07-24 DIAGNOSIS — E785 Hyperlipidemia, unspecified: Secondary | ICD-10-CM | POA: Diagnosis not present

## 2019-07-24 DIAGNOSIS — I447 Left bundle-branch block, unspecified: Secondary | ICD-10-CM | POA: Diagnosis not present

## 2019-07-24 DIAGNOSIS — Z7982 Long term (current) use of aspirin: Secondary | ICD-10-CM | POA: Diagnosis not present

## 2019-07-24 DIAGNOSIS — Z86718 Personal history of other venous thrombosis and embolism: Secondary | ICD-10-CM | POA: Insufficient documentation

## 2019-07-24 DIAGNOSIS — Z853 Personal history of malignant neoplasm of breast: Secondary | ICD-10-CM | POA: Insufficient documentation

## 2019-07-24 MED ORDER — ROSUVASTATIN CALCIUM 5 MG PO TABS: ORAL_TABLET | ORAL | 5 refills | Status: DC

## 2019-07-24 NOTE — Patient Instructions (Signed)
No labs today.   START Crestor 5mg (1 tab) once daily.START TAKING THIS EVERY OVER DAY CAN INCREASE TO DAILY AS TOLERATED.  Please continue all other current medications as prescribed.  Your physician recommends that you schedule a follow-up appointment in: 6 months with an echo prior. We will contact you to schedule this appointment.  Your physician has requested that you have an echocardiogram. Echocardiography is a painless test that uses sound waves to create images of your heart. It provides your doctor with information about the size and shape of your heart and how well your heart's chambers and valves are working. This procedure takes approximately one hour. There are no restrictions for this procedure.  At the [facility], you and your health needs are our priority. As part of our continuing mission to provide you with exceptional heart care, we have created designated Provider Care Teams. These Care Teams include your primary Cardiologist (physician) and Advanced Practice Providers (APPs- Physician Assistants and Nurse Practitioners) who all work together to provide you with the care you need, when you need it.   You may see any of the following providers on your designated Care Team at your next follow up: [name] Dr Glori Bickers . Dr Loralie Champagne . Darrick Grinder, NP   Please be sure to bring in all your medications bottles to every appointment.

## 2019-07-24 NOTE — Addendum Note (Signed)
Encounter addended by: Shonna Chock, CMA on: [id number] 2:47 PM  Actions taken: Order list changed, Diagnosis association updated, Clinical Note Signed

## 2019-08-19 ENCOUNTER — Other Ambulatory Visit (HOSPITAL_COMMUNITY): Payer: Self-pay | Admitting: Internal Medicine

## 2019-08-20 ENCOUNTER — Telehealth: Payer: Self-pay

## 2019-08-20 NOTE — Telephone Encounter (Signed)
Left message for patient to remind of missed remote transmission.  

## 2019-09-02 ENCOUNTER — Telehealth: Payer: Self-pay

## 2019-09-02 ENCOUNTER — Ambulatory Visit (INDEPENDENT_AMBULATORY_CARE_PROVIDER_SITE_OTHER): Payer: PRIVATE HEALTH INSURANCE

## 2019-09-02 DIAGNOSIS — I5022 Chronic systolic (congestive) heart failure: Secondary | ICD-10-CM | POA: Diagnosis not present

## 2019-09-02 DIAGNOSIS — Z95810 Presence of automatic (implantable) cardiac defibrillator: Secondary | ICD-10-CM | POA: Diagnosis not present

## 2019-09-02 NOTE — Progress Notes (Signed)
EPIC Encounter for ICM Monitoring  Patient Name: [name] is a 68 y.o. female Date: 09/02/2019 Primary Care Physican: [name], MD Primary Cardiologist:Bensimhon Electrophysiologist: Allred Bi-V Pacing: 97% 07/24/2019 Weight:270lbs       Attempted call to patient and unable to reach.  Left detailed message per DPR regarding transmission. Transmission reviewed.    Corvue thoracic impedancenormal but suggestive of possible fluid accumulation 10/18-10/25.  Prescribed:Furosemide 40 mg 1 tabletthree times a week Monday, Wed and Fridayandas needed.  Labs: 07/23/2019 Creatinine 1.12, BUN 15, Potassium 3.8, Sodium 138   Recommendations: Left voice mail with ICM number and encouraged to call if experiencing any fluid symptoms.  Follow-up plan: ICM clinic phone appointment on 10/08/2019.   91 day device clinic remote transmission 10/07/2019.    Copy of ICM check sent to Dr. [name].   3 month ICM trend: 09/02/2019    1 Year ICM trend:       [name], RN 09/02/2019 10:26 AM

## 2019-09-02 NOTE — Telephone Encounter (Signed)
Remote ICM transmission received.  Attempted call to patient regarding ICM remote transmission and left detailed message per DPR.  Advised to return call for any fluid symptoms or questions.  

## 2019-09-16 ENCOUNTER — Other Ambulatory Visit (HOSPITAL_COMMUNITY): Payer: Self-pay | Admitting: Internal Medicine

## 2019-10-07 ENCOUNTER — Ambulatory Visit (INDEPENDENT_AMBULATORY_CARE_PROVIDER_SITE_OTHER): Payer: PRIVATE HEALTH INSURANCE | Admitting: *Deleted

## 2019-10-07 DIAGNOSIS — I5022 Chronic systolic (congestive) heart failure: Secondary | ICD-10-CM | POA: Diagnosis not present

## 2019-10-07 LAB — CUP PACEART REMOTE DEVICE CHECK
Battery Remaining Longevity: 38 mo
Battery Remaining Percentage: 49 %
Battery Voltage: 2.93 V
Brady Statistic AP VP Percent: 4.6 %
Brady Statistic AP VS Percent: 1 %
Brady Statistic AS VP Percent: 92 %
Brady Statistic AS VS Percent: 1.9 %
Brady Statistic RA Percent Paced: 4 %
Date Time Interrogation Session: 20201207020022
HighPow Impedance: 90 Ohm
HighPow Impedance: 90 Ohm
Implantable Lead Implant Date: 20120914
Implantable Lead Implant Date: 20120914
Implantable Lead Implant Date: 20121116
Implantable Lead Location: 753858
Implantable Lead Location: 753859
Implantable Lead Location: 753860
Implantable Lead Model: 511212
Implantable Lead Serial Number: 194059
Implantable Pulse Generator Implant Date: 20170314
Lead Channel Impedance Value: 280 Ohm
Lead Channel Impedance Value: 390 Ohm
Lead Channel Impedance Value: 400 Ohm
Lead Channel Pacing Threshold Amplitude: 0.75 V
Lead Channel Pacing Threshold Amplitude: 1.125 V
Lead Channel Pacing Threshold Amplitude: 1.75 V
Lead Channel Pacing Threshold Pulse Width: 0.5 ms
Lead Channel Pacing Threshold Pulse Width: 0.5 ms
Lead Channel Pacing Threshold Pulse Width: 0.7 ms
Lead Channel Sensing Intrinsic Amplitude: 0.7 mV
Lead Channel Sensing Intrinsic Amplitude: 11.7 mV
Lead Channel Setting Pacing Amplitude: 1.75 V
Lead Channel Setting Pacing Amplitude: 2.125
Lead Channel Setting Pacing Amplitude: 2.5 V
Lead Channel Setting Pacing Pulse Width: 0.5 ms
Lead Channel Setting Pacing Pulse Width: 0.7 ms
Lead Channel Setting Sensing Sensitivity: 0.5 mV
Pulse Gen Serial Number: 7328650

## 2019-10-08 ENCOUNTER — Ambulatory Visit (INDEPENDENT_AMBULATORY_CARE_PROVIDER_SITE_OTHER): Payer: PRIVATE HEALTH INSURANCE

## 2019-10-08 DIAGNOSIS — Z95810 Presence of automatic (implantable) cardiac defibrillator: Secondary | ICD-10-CM

## 2019-10-08 DIAGNOSIS — I5022 Chronic systolic (congestive) heart failure: Secondary | ICD-10-CM | POA: Diagnosis not present

## 2019-10-09 NOTE — Progress Notes (Signed)
EPIC Encounter for ICM Monitoring  Patient Name: [name] is a 68 y.o. female Date: 10/09/2019 Primary Care Physican: [name], MD Primary Cardiologist:Bensimhon Electrophysiologist: Allred Bi-V Pacing: 97% 10/09/2019 Weight:264lbs   Spoke with patient and she denies fluid symptoms during decreased impedance.     Corvue thoracic impedancenormal but suggestive of possible fluid accumulation 11/24 - 11/28.  Prescribed:Furosemide 40 mg 1 tabletthree times a week Monday, Wed and Fridayandas needed.  Labs: 07/23/2019 Creatinine 1.12, BUN 15, Potassium 3.8, Sodium 138   Recommendations:  No changes and encouraged to call if experiencing any fluid symptoms.  Follow-up plan: ICM clinic phone appointment on 11/11/2019.   Copy of ICM check sent to Dr. [name]  3 month ICM trend: 10/07/2019    1 Year ICM trend:       [name], RN 10/09/2019 10:18 AM

## 2019-10-15 ENCOUNTER — Other Ambulatory Visit (HOSPITAL_COMMUNITY): Payer: Self-pay

## 2019-10-15 ENCOUNTER — Other Ambulatory Visit (HOSPITAL_COMMUNITY): Payer: Self-pay | Admitting: Internal Medicine

## 2019-10-15 MED ORDER — ENTRESTO 49-51 MG PO TABS: 1.0000 | ORAL_TABLET | Freq: Two times a day (BID) | ORAL | 1 refills | Status: DC

## 2019-11-03 NOTE — Progress Notes (Signed)
ICD remote 

## 2019-11-11 ENCOUNTER — Ambulatory Visit (INDEPENDENT_AMBULATORY_CARE_PROVIDER_SITE_OTHER): Payer: PRIVATE HEALTH INSURANCE

## 2019-11-11 DIAGNOSIS — Z95810 Presence of automatic (implantable) cardiac defibrillator: Secondary | ICD-10-CM

## 2019-11-11 DIAGNOSIS — I5022 Chronic systolic (congestive) heart failure: Secondary | ICD-10-CM | POA: Diagnosis not present

## 2019-11-12 NOTE — Progress Notes (Signed)
EPIC Encounter for ICM Monitoring  Patient Name: [name] is a 69 y.o. female Date: 11/12/2019 Primary Care Physican: [name], MD Primary Cardiologist:Bensimhon Electrophysiologist: Allred Bi-V Pacing: 97% 12/9/2020Weight:264lbs   Spoke with patient and she denies fluid symptoms at this time.  She did take PRN Furosemide during Christmas due to eating foods that had more salt.     Corvue thoracic impedancenormal.  Prescribed:Furosemide 40 mg 1 tabletthree times a week Monday, Wed and Fridayandas needed.  Labs: 07/23/2019 Creatinine 1.12, BUN 15, Potassium 3.8, Sodium 138  Recommendations: No changes and encouraged to call if experiencing any fluid symptoms.  Follow-up plan: ICM clinic phone appointment on 12/16/2019.   91 day device clinic remote transmission 01/06/2020.     Copy of ICM check sent to Dr. [name].   3 month ICM trend: 11/11/2019    1 Year ICM trend:       [name], RN 11/12/2019 5:02 PM

## 2019-11-15 ENCOUNTER — Telehealth (HOSPITAL_COMMUNITY): Payer: Self-pay | Admitting: Pharmacy Technician

## 2019-11-15 NOTE — Telephone Encounter (Signed)
Patient Advocate Encounter   Received notification from [place] that prior authorization for BPTCRMRE is required.   PA submitted on CoverMyMeds Key [id number] Status is pending   Will continue to follow.  Charlann Boxer, CPhT

## 2019-11-18 NOTE — Telephone Encounter (Signed)
Pharmacy called for update on corlanor PA, reports it is time to fill blister packs and no update available regarding medication.   detailed message left for pharmacy that PA is still pending and as soon update is available we will contact patient.

## 2019-11-20 NOTE — Telephone Encounter (Signed)
Called patient's insurance, the PA for [name] is still pending. It is being reviewed but they have no determination at this time. Should be done soon but there was no definite date of when it would be completed.  Will follow up.  Charlann Boxer, CPhT

## 2019-11-21 ENCOUNTER — Other Ambulatory Visit (HOSPITAL_COMMUNITY): Payer: Self-pay | Admitting: *Deleted

## 2019-11-21 MED ORDER — IVABRADINE HCL 5 MG PO TABS: 2.5000 mg | ORAL_TABLET | Freq: Two times a day (BID) | ORAL | 3 refills | Status: DC

## 2019-11-22 NOTE — Telephone Encounter (Signed)
Advanced Heart Failure Patient Advocate Encounter  Prior Authorization for [name] has been approved.    PA#  [id number]  Effective dates: 11/21/19 through 11/14/20  Charlann Boxer, CPhT

## 2019-12-16 ENCOUNTER — Ambulatory Visit (INDEPENDENT_AMBULATORY_CARE_PROVIDER_SITE_OTHER): Payer: PRIVATE HEALTH INSURANCE

## 2019-12-16 DIAGNOSIS — M25511 Pain in right shoulder: Secondary | ICD-10-CM

## 2019-12-16 DIAGNOSIS — Z95810 Presence of automatic (implantable) cardiac defibrillator: Secondary | ICD-10-CM

## 2019-12-16 DIAGNOSIS — I5022 Chronic systolic (congestive) heart failure: Secondary | ICD-10-CM | POA: Diagnosis not present

## 2019-12-18 NOTE — Progress Notes (Signed)
EPIC Encounter for ICM Monitoring  Patient Name: [name] is a 69 y.o. female Date: 12/18/2019 Primary Care Physican: [name], MD Primary Cardiologist:Bensimhon Electrophysiologist: Allred Bi-V Pacing: 97% 2/17/2021Weight:264lbs   Spoke with patient and she denies fluid symptoms at this time.    Corvue thoracic impedancenormal.  Prescribed:Furosemide 40 mg 1 tabletthree times a week Monday, Wed and Fridayandas needed.  Labs: 07/23/2019 Creatinine 1.12, BUN 15, Potassium 3.8, Sodium 138  Recommendations:No changes and encouraged to call if experiencing any fluid symptoms.  Follow-up plan: ICM clinic phone appointment on 01/20/2020.   91 day device clinic remote transmission 01/06/2020.     Copy of ICM check sent to Dr. [name].   3 month ICM trend: 12/16/2019    1 Year ICM trend:       [name], RN 12/18/2019 4:34 PM

## 2020-01-06 ENCOUNTER — Ambulatory Visit (INDEPENDENT_AMBULATORY_CARE_PROVIDER_SITE_OTHER): Payer: PRIVATE HEALTH INSURANCE | Admitting: *Deleted

## 2020-01-06 DIAGNOSIS — I5022 Chronic systolic (congestive) heart failure: Secondary | ICD-10-CM

## 2020-01-06 LAB — CUP PACEART REMOTE DEVICE CHECK
Battery Remaining Longevity: 37 mo
Battery Remaining Percentage: 46 %
Battery Voltage: 2.93 V
Brady Statistic AP VP Percent: 4.8 %
Brady Statistic AP VS Percent: 1 %
Brady Statistic AS VP Percent: 92 %
Brady Statistic AS VS Percent: 1.7 %
Brady Statistic RA Percent Paced: 4.2 %
Date Time Interrogation Session: 20210308021235
HighPow Impedance: 120 Ohm
HighPow Impedance: 120 Ohm
Implantable Lead Implant Date: 20120914
Implantable Lead Implant Date: 20120914
Implantable Lead Implant Date: 20121116
Implantable Lead Location: 753858
Implantable Lead Location: 753859
Implantable Lead Location: 753860
Implantable Lead Model: 511212
Implantable Lead Serial Number: 194059
Implantable Pulse Generator Implant Date: 20170314
Lead Channel Impedance Value: 260 Ohm
Lead Channel Impedance Value: 360 Ohm
Lead Channel Impedance Value: 400 Ohm
Lead Channel Pacing Threshold Amplitude: 0.75 V
Lead Channel Pacing Threshold Amplitude: 1 V
Lead Channel Pacing Threshold Amplitude: 1.625 V
Lead Channel Pacing Threshold Pulse Width: 0.5 ms
Lead Channel Pacing Threshold Pulse Width: 0.5 ms
Lead Channel Pacing Threshold Pulse Width: 0.7 ms
Lead Channel Sensing Intrinsic Amplitude: 0.6 mV
Lead Channel Sensing Intrinsic Amplitude: 11.7 mV
Lead Channel Setting Pacing Amplitude: 1.75 V
Lead Channel Setting Pacing Amplitude: 2 V
Lead Channel Setting Pacing Amplitude: 2.5 V
Lead Channel Setting Pacing Pulse Width: 0.5 ms
Lead Channel Setting Pacing Pulse Width: 0.7 ms
Lead Channel Setting Sensing Sensitivity: 0.5 mV
Pulse Gen Serial Number: 7328650

## 2020-01-06 NOTE — Progress Notes (Signed)
ICD Remote  

## 2020-01-10 ENCOUNTER — Ambulatory Visit (HOSPITAL_COMMUNITY)
Admission: RE | Admit: 2020-01-10 | Discharge: 2020-01-10 | Disposition: A | Discharge status: Home / Self Care | Payer: PRIVATE HEALTH INSURANCE | Source: Ambulatory Visit | Attending: Internal Medicine | Admitting: Internal Medicine

## 2020-01-10 ENCOUNTER — Ambulatory Visit (HOSPITAL_BASED_OUTPATIENT_CLINIC_OR_DEPARTMENT_OTHER)
Admission: RE | Admit: 2020-01-10 | Discharge: 2020-01-10 | Disposition: A | Discharge status: Home / Self Care | Payer: PRIVATE HEALTH INSURANCE | Source: Ambulatory Visit | Attending: Internal Medicine | Admitting: Internal Medicine

## 2020-01-10 ENCOUNTER — Encounter (HOSPITAL_COMMUNITY): Payer: Self-pay | Admitting: Internal Medicine

## 2020-01-10 ENCOUNTER — Other Ambulatory Visit: Payer: Self-pay

## 2020-01-10 VITALS — BP 107/57 | HR 75 | Wt 275.0 lb

## 2020-01-10 DIAGNOSIS — Z95810 Presence of automatic (implantable) cardiac defibrillator: Secondary | ICD-10-CM | POA: Insufficient documentation

## 2020-01-10 DIAGNOSIS — I447 Left bundle-branch block, unspecified: Secondary | ICD-10-CM | POA: Diagnosis not present

## 2020-01-10 DIAGNOSIS — I428 Other cardiomyopathies: Secondary | ICD-10-CM | POA: Diagnosis not present

## 2020-01-10 DIAGNOSIS — R110 Nausea: Secondary | ICD-10-CM | POA: Insufficient documentation

## 2020-01-10 DIAGNOSIS — I5022 Chronic systolic (congestive) heart failure: Secondary | ICD-10-CM | POA: Insufficient documentation

## 2020-01-10 DIAGNOSIS — I480 Paroxysmal atrial fibrillation: Secondary | ICD-10-CM | POA: Diagnosis not present

## 2020-01-10 DIAGNOSIS — Z79899 Other long term (current) drug therapy: Secondary | ICD-10-CM | POA: Insufficient documentation

## 2020-01-10 DIAGNOSIS — I110 Hypertensive heart disease with heart failure: Secondary | ICD-10-CM | POA: Diagnosis not present

## 2020-01-10 DIAGNOSIS — Z7901 Long term (current) use of anticoagulants: Secondary | ICD-10-CM | POA: Diagnosis not present

## 2020-01-10 DIAGNOSIS — Z86718 Personal history of other venous thrombosis and embolism: Secondary | ICD-10-CM | POA: Diagnosis not present

## 2020-01-10 DIAGNOSIS — E669 Obesity, unspecified: Secondary | ICD-10-CM | POA: Diagnosis not present

## 2020-01-10 DIAGNOSIS — Z7982 Long term (current) use of aspirin: Secondary | ICD-10-CM | POA: Insufficient documentation

## 2020-01-10 DIAGNOSIS — F329 Major depressive disorder, single episode, unspecified: Secondary | ICD-10-CM | POA: Insufficient documentation

## 2020-01-10 DIAGNOSIS — M1990 Unspecified osteoarthritis, unspecified site: Secondary | ICD-10-CM | POA: Diagnosis not present

## 2020-01-10 DIAGNOSIS — H409 Unspecified glaucoma: Secondary | ICD-10-CM | POA: Diagnosis not present

## 2020-01-10 DIAGNOSIS — E785 Hyperlipidemia, unspecified: Secondary | ICD-10-CM | POA: Diagnosis not present

## 2020-01-10 DIAGNOSIS — M25551 Pain in right hip: Secondary | ICD-10-CM | POA: Insufficient documentation

## 2020-01-10 NOTE — Patient Instructions (Signed)
Please call our office in August to schedule your 6 month follow up  If you have any questions or concerns before your next appointment please send [place] a message through [place] or call our office at [phone].  At the [facility], you and your health needs are our priority. As part of our continuing mission to provide you with exceptional heart care, we have created designated Provider Care Teams. These Care Teams include your primary Cardiologist (physician) and Advanced Practice Providers (APPs- Physician Assistants and Nurse Practitioners) who all work together to provide you with the care you need, when you need it.   You may see any of the following providers on your designated Care Team at your next follow up: [name] Dr Glori Bickers . Dr Loralie Champagne . Darrick Grinder, NP . Lyda Jester, PA . Audry Riles, PharmD   Please be sure to bring in all your medications bottles to every appointment.

## 2020-01-10 NOTE — Progress Notes (Signed)
  Echocardiogram 2D Echocardiogram has been performed.  Darlina Sicilian M 01/10/2020, 2:09 PM

## 2020-01-10 NOTE — Progress Notes (Signed)
ADVANCED HF CLINIC NOTE   Patient ID: [name], female   DOB: 12-14-1950, 69 y.o.   MRN: [id number] Oncologist: Dr. Philipp Ovens at [facility] EP: Dr. [name] Dr. Bea Graff in [place] HF: [name]  HPI: [name] is a 69 y.o. woman with a history of HTN, Type B aortic dissection (2010) managed with medical therapy by Dr. Prescott Gum and has healed, LBBB, systolic heart failure due to NICM with EF 10-15% Cath Dec 2011 showed normal arteries.    She was diagnosed with breast cancer in 2009 s/p lumpectomy, XRT, and chemotherapy.  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   She underwent St Jude Bi-V ICD implantation Sept 2012 but unable to place LV lead.  She eventually brought back in Nov 2012 for left thoracotomy and placement of epicardial LV lead placement by Dr. Prescott Gum.  She also had a sleep study that showed mild OSA.  No clinical intervention occurred.    She presents today for regular follow up. Previously corlanor cut back to 2.5 mg BID with occasional bradycardia. Overall doing fairly well. Still with some nausea on occasion felt to be vagal. Says knees are doing some better. But having severe right hip pain. Uses a walker. Denies SOB unless she is moving around and holding her breath. No orthopnea, PND.  Weight up another 5 pounds.   Echo today 01/10/20: LVEF 10% with severe dyssynchrony. RV ok Personally reviewed   Echo 12/2017 LVEF 20%, RV ok.    10/10/2012 CPX Peak VO2: 10.6 % predicted peak VO2: 64.4% VE/VCO2 slope: 26.9 OUES: 1.18 Peak RER: 1.29 Ventilatory Threshold: 7.3 % predicted peak VO2: 44.4% Peak RR 23 Peak Ventilation: 29.8 VE/MVV: 32.6% PETCO2 at peak: 43  01/2014 CPX Peak VO2: 10.3 (66.4% predicted peak VO2) VE/VCO2 slope: 25.0 OUES: 1.30 Peak RER: 1.20  CPX (8/16): Peak VO2 10.1 VE/VCO2 slope 28.4 RER 1.14 OUES 1.3 PFTs mildly restrictive No significant change compared to prior.   Limited more by obesity and restrictive lung physiology than heart (mild circulatory limitation).   ECHO 01/02/13 EF 0000000 Grade 1 diastolic dysfunction. RV ok. No PAH ECHO 07/10/13 EF 20% RV ok ECHO 10/13/14 EF 15-20% RV ok Echo 10/2015 EF 15%  Echo 09/30/16 LVEF 10-15%, Grade 1 DD, mild PI, Mild TR. Normal RV.  Review of systems complete and found to be negative unless listed in HPI.      Past Medical History:  Diagnosis Date  . Aortic dissection ([place]) 12/2008   Type B  . Arthritis    lower back  . Atrial fibrillation ([place])    post op (left thoracotomy) 11/12 req amio/DCCV  . Breast cancer ([place]) 07/2009   Stage 3 lumpectomy, radiation, chemotherapy; finished chemo October, felt to be in remission  . Cardiac defibrillator in place    St. Jude (JA) 9/12; LV lead placement unsuccessful;  s/p left thoracotomy with placement of epicardial LV lead 09/2011 (Dr. PVT)  . CHF (congestive heart failure) ([place])   . Chronic systolic dysfunction of left ventricle   . Depression   . DVT (deep venous thrombosis) ([place]) 10/18/2010  . GERD (gastroesophageal reflux disease)   . Glaucoma   . Hiatal hernia    small hiatal hernia  . History of shingles   . Hyperlipidemia   . Hypertension   . Left bundle branch block   . Nonischemic cardiomyopathy ([place])    Admission 12/11 with EF 25%, normal coronary arteries by  cath 12/11; NICM presumed to be secondary to chemotherapy for breast cancer  . Obesity   . [name]    with exertion   Current Outpatient Medications  Medication Sig Dispense Refill  . albuterol (PROVENTIL HFA;VENTOLIN HFA) 108 (90 BASE) MCG/ACT inhaler Inhale 2 puffs into the lungs every 4 (four) hours as needed. [name]     . albuterol (PROVENTIL) (2.5 MG/3ML) 0.083% nebulizer solution Take 2.5 mg by nebulization every 4 (four) hours as needed. [name]     . alendronate (FOSAMAX) 70 MG tablet Take 70 mg by mouth once a week. Take with a full glass of water  on an empty stomach. Takes on Monday    . allopurinol (ZYLOPRIM) 300 MG tablet Take 1 tablet (300 mg total) by mouth daily. 90 tablet 1  . ALPRAZolam (XANAX) 1 MG tablet Take 0.5 mg by mouth daily as needed for anxiety. Anxiety     . aspirin 81 MG EC tablet Take 81 mg by mouth daily.      . Aspirin Buf,CaCarb-MgCarb-MgO, 81 MG TABS Take 1 tablet by mouth daily.    [name] azelastine (OPTIVAR) 0.05 % ophthalmic solution Place 1 drop into both eyes daily.    . Bepotastine Besilate (BEPREVE) 1.5 % SOLN Place 1 drop into both eyes daily as needed. Dry eyes     . bimatoprost (LUMIGAN) 0.03 % ophthalmic solution Place 1 drop into both eyes at bedtime. Reported on 01/12/2016    . Calcium Carbonate-Vitamin D (CALCIUM-VITAMIN D3) 600-200 MG-UNIT TABS Take 1 tablet by mouth 2 (two) times daily.      . carvedilol (COREG) 12.5 MG tablet TAKE ONE TABLET BY MOUTH TWICE DAILY 180 tablet 3  . cetirizine (ZYRTEC) 10 MG tablet Take 1 tablet by mouth daily.    . cholecalciferol (VITAMIN D) 1000 UNITS tablet Take 1,000 Units by mouth 2 (two) times daily.      [name] COLCRYS 0.6 MG tablet TAKE ONE TABLET BY MOUTH EVERY DAY 30 tablet 1  . cyclobenzaprine (FLEXERIL) 10 MG tablet Take 1 tablet (10 mg total) by mouth 3 (three) times daily as needed for muscle spasms. 30 tablet 0  . diclofenac sodium (VOLTAREN) 1 % GEL APPLY THREE GRAMS TO THREE LARGE JOINTS UP TO THREE TIMES DAILY AS NEEDED 3 Tube 3  . docusate sodium (COLACE) 100 MG capsule Take 100 mg by mouth 2 (two) times daily.    . fexofenadine (ALLEGRA) 180 MG tablet Take 180 mg by mouth daily as needed. Allergies     . furosemide (LASIX) 40 MG tablet Take 1 tablet (40 mg total) by mouth 3 (three) times a week. Monday, Wednesday and Friday and as needed 90 tablet 3  . HYDROcodone-acetaminophen (NORCO/VICODIN) 5-325 MG tablet TAKE ONE TABLET BY MOUTH EVERY 6 HOURS PRN    . ivabradine ([name]) 5 MG TABS tablet Take 0.5 tablets (2.5 mg total) by mouth 2 (two) times daily. 60  tablet 3  . montelukast (SINGULAIR) 10 MG tablet Take 10 mg by mouth at bedtime. For allergies & congestion    . Multiple Vitamin (MULTIVITAMIN) tablet Take 1 tablet by mouth daily.      . Nutritional Supplements (WELLNESS ESSENTIALS PO) Take 4 tablets by mouth daily.    [name] omeprazole-sodium bicarbonate (ZEGERID) 40-1100 MG capsule Take 1 capsule by mouth daily.    [name] PARoxetine (PAXIL) 20 MG tablet Take 20 mg by mouth daily.  1  . Polyethyl Glycol-Propyl Glycol (SYSTANE OP) Apply 1  drop to eye daily as needed (dry eyes).     . rosuvastatin (CRESTOR) 5 MG tablet Take 1 tablet by mouth daily 30 tablet 5  . sacubitril-valsartan (ENTRESTO) 49-51 MG Take 1 tablet by mouth 2 (two) times daily. 60 tablet 1  . spironolactone (ALDACTONE) 25 MG tablet TAKE ONE TABLET BY MOUTH daily 90 tablet 3  . traMADol (ULTRAM) 50 MG tablet Take 50 mg by mouth every 6 (six) hours as needed for pain.     No current facility-administered medications for this encounter.   PHYSICAL EXAM: Vitals:   01/10/20 1409  BP: (!) 107/57  Pulse: 75  SpO2: 97%  Weight: 124.7 kg (275 lb)   Wt Readings from Last 3 Encounters:  01/10/20 124.7 kg (275 lb)  07/24/19 122.6 kg (270 lb 3.2 oz)  05/29/19 119.5 kg (263 lb 6.4 oz)   Physical Exam  General:  Wbese woman sitting in WC. No resp difficulty HEENT: normal Neck: supple. no JVD. Carotids 2+ bilat; no bruits. No lymphadenopathy or thryomegaly appreciated. Cor: PMI nondisplaced. Regular rate & rhythm. No rubs, gallops or murmurs. Lungs: clear Abdomen: obese soft, nontender, nondistended. No hepatosplenomegaly. No bruits or masses. Good bowel sounds. Extremities: no cyanosis, clubbing, rash, 1+ edema stockings in place Neuro: alert & orientedx3, cranial nerves grossly intact. moves all 4 extremities w/o difficulty. Affect pleasant    ASSESSMENT & PLAN: 1. Chronic systolic CHF: Nonischemic cardiomyopathy.  Echo 09/30/16 LVEF 10-15%, Grade 1 DD, mild PI, Mild TR. Normal RV.  s/p St Jude Bi-V ICD with epicardial LV lead. - Echo 12/2017 EF 20% stable. (Personally reviewed.) She is stable, so will plan repeat q 2 years.  - CPX in 8/16 was similar to the past: it appeared that the major limitation was from body habitus/deconditioning and not cardiac - Echo today (01/10/20) with LVEF 10-15% severe dyssynchrony, RV ok  - Stable NYHA III-IIIB symptoms, confounded by severe arthritis.  - Volume status stable mildly elevated on exam. Due for lasix today. - Continue Entresto 49/51 mg BID. Unable to titrate due to low BP - Continue lasix 40 mg M/W/F. Can take extra as needed.  - Continue coreg 12.5 mg BID.  - Continue spironolactone 25 daily.  - Continue corlanor 2.5 bid - We discussed SGLT2i but concern for perineal hygiene so will avoid - Had labs recently at Dr. Willette Pa office. I will review. - Reinforced fluid restriction to < 2 L daily, sodium restriction to less than 2000 mg daily, and the importance of daily weights.   - Likely would not qualify for advanced therapies with her comorbidities.  - Will refer to [facility] for AV and VV optimization due to severe dyssynchrony  - ICD interrogated personally in Clinic. No VT/AF. Impedance ok.   2. Paroxysmal atrial fibrillation:  - This was post-op.  - Regular on exam   - Continue ASA 81 mg daily.  - If recurs, will need antiocoag with CHA2DS2/VAS of 4.  3. Type B aortic dissection:  - Stable. BP stable.  4. HTN  - BP runs low.  5. Arthritis - Knees and Hips. Not candidate for surgery with severe LV dysfunction.  - No change 6. HL  - Continue crestor   Glori Bickers, MD 01/10/2020

## 2020-01-17 ENCOUNTER — Encounter: Payer: PRIVATE HEALTH INSURANCE | Admitting: Nurse Practitioner

## 2020-01-20 ENCOUNTER — Ambulatory Visit: Payer: PRIVATE HEALTH INSURANCE | Admitting: Allergy and Immunology

## 2020-01-20 ENCOUNTER — Ambulatory Visit (INDEPENDENT_AMBULATORY_CARE_PROVIDER_SITE_OTHER): Payer: PRIVATE HEALTH INSURANCE

## 2020-01-20 DIAGNOSIS — Z95810 Presence of automatic (implantable) cardiac defibrillator: Secondary | ICD-10-CM

## 2020-01-20 DIAGNOSIS — I5022 Chronic systolic (congestive) heart failure: Secondary | ICD-10-CM | POA: Diagnosis not present

## 2020-01-21 NOTE — Progress Notes (Signed)
EPIC Encounter for ICM Monitoring  Patient Name: [name] is a 69 y.o. female Date: 01/21/2020 Primary Care Physican: [name], MD Primary Cardiologist:Bensimhon Electrophysiologist: Allred Bi-V Pacing: 97% 3/23/2021Weight:271lbs   Spoke with patient and she denies fluid symptomsat this time.   Corvue thoracic impedancenormal.  Prescribed:Furosemide 40 mg 1 tabletthree times a week Monday, Wed and Friday.Dr Bensimhon's 01/10/20 OV note states she can take extra Furosemide as needed.  Labs: 07/23/2019 Creatinine 1.12, BUN 15, Potassium 3.8, Sodium 138  Recommendations:No changes and encouraged to call if experiencing any fluid symptoms.  Follow-up plan: ICM clinic phone appointment on 02/24/2020. 91 day device clinic remote transmission 04/06/2020.  Office appt 02/13/2020 with [name], NP.    Copy of ICM check sent to Dr. [name].   3 month ICM trend: 01/20/2020    1 Year ICM trend:        [name], RN 01/21/2020 4:38 PM

## 2020-01-23 ENCOUNTER — Other Ambulatory Visit (HOSPITAL_COMMUNITY): Payer: Self-pay | Admitting: Internal Medicine

## 2020-02-05 ENCOUNTER — Ambulatory Visit: Payer: PRIVATE HEALTH INSURANCE | Admitting: Allergy and Immunology

## 2020-02-10 ENCOUNTER — Other Ambulatory Visit (HOSPITAL_COMMUNITY): Payer: Self-pay | Admitting: Internal Medicine

## 2020-02-13 ENCOUNTER — Other Ambulatory Visit: Payer: Self-pay

## 2020-02-13 ENCOUNTER — Encounter: Payer: PRIVATE HEALTH INSURANCE | Admitting: Nurse Practitioner

## 2020-02-13 ENCOUNTER — Other Ambulatory Visit (HOSPITAL_COMMUNITY): Payer: PRIVATE HEALTH INSURANCE

## 2020-02-17 ENCOUNTER — Ambulatory Visit: Payer: PRIVATE HEALTH INSURANCE | Admitting: Allergy and Immunology

## 2020-02-20 NOTE — Progress Notes (Addendum)
    Electrophysiology CRT AV optimization   Date: 02/20/2020  ID:  ILEE PASTORA, DOB [birth year], MRN [id number]  PCP: [name], MD Primary Cardiologist: Dr. Haroldine Laws Electrophysiologist: Thompson Grayer, MD   CC: Heart failure despite LV lead placement/ dyssynchrony on Echo  St. Jude CRT-D implanted 07/2011 (BIV upgrade 09/2011), gen change 01/12/2016 for CMP, Chronic systolic CHF History of appropriate therapy: No History of AAD therapy: No  LV lead History: Model: Alba. ([place]) [phone] Myopore Location: Epicardial Threshold 1.75V @ 0.5 ms Vector N/A -> Epicardial lead Revisions/CXR: Most recent CXR 12/2013 stable epicardial LV lead placement Diaphragmatic Stim: No VV timing: Simultaneous on presentation  Prior optimization/changes: 02/2012 - AV opt - echo - LV-> RV 40 ms, Paced AV delay 160 ms changed to 140 ms, Sensed AV delay 120 ms 12/2012 - Quick op recommended LV->RV by 30 sec (from 40 sec) 12/2012 - AV opt - echo - LV -> RV 70 ms, Paced AV delay 140 ms to 130 ms  12/2015 - Gen change - LV -> RV simultaneous, Paced AV delay out to 170 ms  Echocardiogram: Pre-device implant: 05/2011 Echo LVEF 15-20% Post-device implant: Most recent Echo, 01/10/2020 with LVEF 10-15% with severe dys-synchrony.   EKG: Pre-device implant: 05/12/2011 showed NSR at 80 bpm with QRS of 172 ms in LBBB pattern. Post-device implant: Most recent 05/11/2018 showed QRS 184 ms, PR 140 ms and rate of 62 bpm.  ICD interrogation: CRT pacing: 94% AF: <1%% PVC burden: <1%% Thoracic impedence: Depressed currently Activity Level: 1-2 hrs most days, up to 4 hours at times HR excursion: Histograms stable See PaceArt report for full details.   Multi-site Pacing: Not option; epicardial wires  EKG:  EKG is ordered today. See summary below.   Assessment and Plan:  Pt sensed AV delay checked from 100 ms to 180 ms Pt VV timing checked from simultaneous to LV first by 45 ms.   1.  Chronic  systolic heart failure She is NYHA III-NYHA IIIb symptoms at baseline now.  Echo 01/10/2020 with LVEF 10-15% with severe dys-synchrony.  Patient reports continue CHF symptoms status post CRT implant. She has had multiple attempts at optimization as above.  Device interrogation today demonstrates BiV pacing 94%.   On presentation pt programmed  LV = RV (simultaneous)  Sensed AV delay 120 ms  Pt device adjusted under Echo and attached to ECG.  Pt has very poor E wave at baseline, and unable to make appreciable difference in AV optimization from sensed AV delay of 100 ms to 160 ms. Slightly improved E wave size with increase from 120 ms to 140 ms.   On arrival, EKG showed BiV pacing 79 bpm, QRS 170 ms, small upright R wave Lead I, RS V1 mostly negative.   At adjustments of sensed AV delay 140 ms, and LV -> RV by 45 ms, QRS improved to 132 ms. Remains mostly positive in lead 1, and mostly negative in V1. No vectors to try with epicardial wires.  Intrinsic IVCD of 110 ms makes further optimization difficult.   On discharge pt programmed LV -> RV by 45 ms Sensed AV delay 140 ms  Disposition:   FU as scheduled. Will see back in 3 months and consider further VV adjustment as needed.  Jacalyn Lefevre, PA-C  02/20/2020 2:47 PM  [place] [address] [phone] (office) [phone] (fax

## 2020-02-24 ENCOUNTER — Ambulatory Visit (INDEPENDENT_AMBULATORY_CARE_PROVIDER_SITE_OTHER): Payer: PRIVATE HEALTH INSURANCE

## 2020-02-24 ENCOUNTER — Ambulatory Visit (HOSPITAL_COMMUNITY): Payer: PRIVATE HEALTH INSURANCE | Attending: Internal Medicine

## 2020-02-24 ENCOUNTER — Other Ambulatory Visit: Payer: Self-pay | Admitting: Student

## 2020-02-24 ENCOUNTER — Other Ambulatory Visit: Payer: Self-pay

## 2020-02-24 ENCOUNTER — Ambulatory Visit (INDEPENDENT_AMBULATORY_CARE_PROVIDER_SITE_OTHER): Payer: PRIVATE HEALTH INSURANCE | Admitting: Student

## 2020-02-24 DIAGNOSIS — Z95810 Presence of automatic (implantable) cardiac defibrillator: Secondary | ICD-10-CM

## 2020-02-24 DIAGNOSIS — I5022 Chronic systolic (congestive) heart failure: Secondary | ICD-10-CM

## 2020-02-24 DIAGNOSIS — I493 Ventricular premature depolarization: Secondary | ICD-10-CM | POA: Diagnosis not present

## 2020-02-24 LAB — CUP PACEART INCLINIC DEVICE CHECK
Date Time Interrogation Session: 20210426103916
Implantable Lead Implant Date: 20120914
Implantable Lead Implant Date: 20120914
Implantable Lead Implant Date: 20121116
Implantable Lead Location: 753858
Implantable Lead Location: 753859
Implantable Lead Location: 753860
Implantable Lead Model: 511212
Implantable Lead Serial Number: 194059
Implantable Pulse Generator Implant Date: 20170314
Pulse Gen Serial Number: 7328650

## 2020-02-24 NOTE — Progress Notes (Signed)
Message sent to [name], PA today to check if patient was fluid overloaded by exam.  Received: Today Message Contents  [name], PA-C  [name], RN  She looked and felt pretty much at her baseline. She laid flat on her back for a moment or two while we were checking her without issue. Would not be unreasonable to try an extra dose or two of diuretic to see if she feels any better.   [name] [address]" [name], PA-C  02/24/2020 11:34 AM

## 2020-02-24 NOTE — Progress Notes (Signed)
EPIC Encounter for ICM Monitoring  Patient Name: [name] is a 69 y.o. female Date: 02/24/2020 Primary Care Physican: [name], MD Primary Cardiologist:Bensimhon Electrophysiologist: Allred Bi-V Pacing: 97% 3/23/2021Weight:271lbs   Patient seen in office today by [name], PA.  Spoke with patient and she reports her ankles are swelling.  Corvue thoracic impedancesuggesting possible ongoing fluid accumulation since 02/19/2020.  Prescribed:Furosemide 40 mg 1 tabletthree times a week Monday, Wed and Friday as needed.  Labs: 01/10/2020 Creatinine 1.14, BUN 19, Potassium 4.3, Sodium 143, GFR 49  Recommendations:Advised to take Furosemide x 2 days and then return to PRN.   Follow-up plan: ICM clinic phone appointment on5/01/2020 to recheck fluid levels. 91 day device clinic remote transmission 04/06/2020.   Copy of ICM check sent to Dr. [name] and Dr Haroldine Laws.   3 month ICM trend: 02/24/2020    1 Year ICM trend:       [name], RN 02/24/2020 10:17 AM

## 2020-02-24 NOTE — Progress Notes (Signed)
For AV opt echo 02/24/2020

## 2020-02-25 ENCOUNTER — Other Ambulatory Visit: Payer: Self-pay

## 2020-02-25 DIAGNOSIS — I480 Paroxysmal atrial fibrillation: Secondary | ICD-10-CM

## 2020-03-03 ENCOUNTER — Ambulatory Visit (INDEPENDENT_AMBULATORY_CARE_PROVIDER_SITE_OTHER): Payer: PRIVATE HEALTH INSURANCE

## 2020-03-03 DIAGNOSIS — I5022 Chronic systolic (congestive) heart failure: Secondary | ICD-10-CM

## 2020-03-03 DIAGNOSIS — Z95810 Presence of automatic (implantable) cardiac defibrillator: Secondary | ICD-10-CM

## 2020-03-03 NOTE — Progress Notes (Signed)
EPIC Encounter for ICM Monitoring  Patient Name: [name] is a 69 y.o. female Date: 03/03/2020 Primary Care Physican: [name], MD Primary Cardiologist:Bensimhon Electrophysiologist: Allred Bi-V Pacing: >99% 3/23/2021Weight:271lbs    Spoke with patient.  She has some ankle swelling.    Corvue thoracic impedancereturned to baseline after 02/24/20 but then returned to below baseline suggesting possible ongoing fluid accumulation since 02/19/2020.  Prescribed:Furosemide 40 mg 1 tabletthree times a week Monday, Wed and Friday as needed.Patient takes Furosemide 3 days week routinely not PRN.   Labs: 01/10/2020 Creatinine 1.14, BUN 19, Potassium 4.3, Sodium 143, GFR 49  Recommendations: Advised patient to take Furosemide 40 mg from Wednesday - Sunday this week and then will recheck fluid levels.    Follow-up plan: ICM clinic phone appointment on5/07/2020 (manual send) to recheck fluid levels.91 day device clinic remote transmission6/04/2020.   Copy of ICM check sent to Dr.Allred and Dr Haroldine Laws.   3 month ICM trend: 03/03/2020    1 Year ICM trend:       [name], RN 03/03/2020 9:40 AM

## 2020-03-09 ENCOUNTER — Ambulatory Visit (INDEPENDENT_AMBULATORY_CARE_PROVIDER_SITE_OTHER): Payer: PRIVATE HEALTH INSURANCE

## 2020-03-09 DIAGNOSIS — Z95810 Presence of automatic (implantable) cardiac defibrillator: Secondary | ICD-10-CM

## 2020-03-09 DIAGNOSIS — I5022 Chronic systolic (congestive) heart failure: Secondary | ICD-10-CM

## 2020-03-09 NOTE — Progress Notes (Signed)
EPIC Encounter for ICM Monitoring  Patient Name: [name] is a 69 y.o. female Date: 03/09/2020 Primary Care Physican: [name], MD Primary Cardiologist:Bensimhon Electrophysiologist: Allred Bi-V Pacing: >99% 3/23/2021Weight:271lbs    Spoke with patient.  She reports ankle swelling has resolved.   Corvue thoracic impedancereturned to baseline after taking extra Lasix.    Prescribed:Furosemide 40 mg 1 tabletthree times a week Monday, Wed and Fridayas needed.Patient takes Furosemide 3 days week routinely not PRN.   Labs: 01/10/2020 Creatinine 1.14, BUN 19, Potassium 4.3, Sodium 143, GFR 49  Recommendations:She has returned to prescribed Lasix dosage.   No changes and encouraged to call if experiencing any fluid symptoms.  Follow-up plan: ICM clinic phone appointment on6/05/2020. 91 day device clinic remote transmission6/04/2020.   Copy of ICM check sent to Dr.Allred.  3 month ICM trend: 03/09/2020    1 Year ICM trend:       [name], RN 03/09/2020 9:45 AM

## 2020-03-16 ENCOUNTER — Ambulatory Visit (INDEPENDENT_AMBULATORY_CARE_PROVIDER_SITE_OTHER): Payer: PRIVATE HEALTH INSURANCE | Admitting: Allergy and Immunology

## 2020-03-16 ENCOUNTER — Encounter: Payer: Self-pay | Admitting: Allergy and Immunology

## 2020-03-16 ENCOUNTER — Other Ambulatory Visit: Payer: Self-pay

## 2020-03-16 VITALS — BP 98/68 | HR 88 | Temp 98.2°F | Resp 18 | Ht 62.0 in | Wt 272.0 lb

## 2020-03-16 DIAGNOSIS — K219 Gastro-esophageal reflux disease without esophagitis: Secondary | ICD-10-CM

## 2020-03-16 MED ORDER — OMEPRAZOLE 40 MG PO CPDR: DELAYED_RELEASE_CAPSULE | ORAL | 5 refills | Status: DC

## 2020-03-16 MED ORDER — FAMOTIDINE 40 MG PO TABS: ORAL_TABLET | ORAL | 5 refills | Status: DC

## 2020-03-16 NOTE — Patient Instructions (Addendum)
  1.  Treat and prevent reflux:   A. Omeprazole 40 mg - 1 tablet 2 times per day  B. Famotidine 40 mg in evening  C. Consolidate caffeine slowly  2. Return to clinic in 4 weeks or earlier if problem.

## 2020-03-16 NOTE — Progress Notes (Signed)
[place]   Dear [name],  Thank you for referring [name] to the [facility] on 03/16/2020.   Below is a summation of this patient's evaluation and recommendations.  Thank you for your referral. I will keep you informed about this patient's response to treatment.   If you have any questions please do not hesitate to contact me.   Sincerely,  Jiles Prows, MD Allergy / Immunology [place]   ______________________________________________________________________    NEW PATIENT NOTE  Referring Provider: Gweneth Fritter, FNP Primary Provider: [name], MD Date of office visit: 03/16/2020    Subjective:   Chief Complaint:  [name] (DOB: [birth year]) is a 69 y.o. female who presents to the clinic on 03/16/2020 with a chief complaint of Cough .     HPI: [name] presents to this clinic in evaluation of cough.  Apparently I had seen [name] in this clinic decades ago for issues of "bronchitis".  It sounds as though I placed her on therapy for reflux and she did relatively well but over the course of the past year or so she has redeveloped very significant coughing to the point where she has posttussive micturition with a tickle stuck in her throat and throat clearing and postnasal drip and some raspy voice.  This cough does appear to disturb her sleep.  She is very limited in her ability to exert herself because of her cardiomyopathy which appeared to be precipitated by the use of chemotherapeutic drugs for her breast cancer.  She has a very tenuous fluid status and must be very careful about the dose of diuretic she is using to maintain that status is optimal level.  She is followed by the chronic congestive heart failure cardiac team.  She does have reflux which she thinks is under pretty good control at this point in time  while using omeprazole and famotidine.  She drinks 16 ounces of soda per day.  She has minimal upper airway symptoms.  Occasionally after coughing for a prolonged period in time she will feel as though her ears are full but this apparently resolves if she can get a period of time without any coughing.  She has been given albuterol in the past which really does not help her very much.  She has tried multiple antihistamines which have not really helped her.  She is using sacubitril for her congestive heart failure.  She has received 2 Covid vaccinations.  Past Medical History:  Diagnosis Date  . 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 Arthritis    lower back  . Atrial fibrillation ([place])    post op (left thoracotomy) 11/12 req amio/DCCV  . Breast cancer ([place]) 07/2009   Stage 3 lumpectomy, radiation, chemotherapy; finished chemo October, felt to be in remission  . Cardiac defibrillator in place    St. Jude (JA) 9/12; LV lead placement unsuccessful;  s/p left thoracotomy with placement of epicardial LV lead 09/2011 (Dr. PVT)  . CHF (congestive heart failure) ([place])   . Chronic systolic dysfunction of left ventricle   . Depression   . DVT (deep venous thrombosis) ([place]) 10/18/2010  . GERD (gastroesophageal reflux disease)   . Glaucoma   . Hiatal hernia    small hiatal hernia  . History of shingles   . Hyperlipidemia   . Hypertension   .  Left bundle branch block   . Nonischemic cardiomyopathy ([place])    Admission 12/11 with EF 25%, normal coronary arteries by cath 12/11; NICM presumed to be secondary to chemotherapy for breast cancer  . Obesity   . [name]    with exertion    Past Surgical History:  Procedure Laterality Date  . BREAST LUMPECTOMY     Right breast  . CARDIAC CATHETERIZATION     2011  . CARDIAC DEFIBRILLATOR PLACEMENT    . EP IMPLANTABLE DEVICE N/A 01/12/2016   STJ ICD Unify Assura gen change, Dr. [name]  . OOPHORECTOMY     Single  . THORACOTOMY   09/16/2011   Procedure: THORACOTOMY MAJOR;  Surgeon: Tharon Aquas Adelene Idler, MD;  Location: [facility] OR;  Service: Thoracic;  Laterality: Left;  left anterolateral Thoracotomy for placement of St. Jude epicardial pacing lead   . TONSILLECTOMY    . TUBAL LIGATION     Bilateral    Allergies as of 03/16/2020      Reactions   Alphagan [brimonidine]    Irritation    Mobic [meloxicam] Other (See Comments)   Cannot take due to heart issue   Nsaids Other (See Comments)   Cannot take due to heart issue   Polyvinyl Alcohol Other (See Comments)   Travatan and Alphagan(irritation)   Statins Other (See Comments)   Per pt her cardiologist told her not to take   Tolmetin Other (See Comments)   Can not take due to heart issue   Travatan Z [travoprost (bak Free)]    Irritation   Tape Rash   Uncoded Allergy. Allergen: Tape Blister from clear tapes.      Medication List      albuterol (2.5 MG/3ML) 0.083% nebulizer solution Commonly known as: PROVENTIL Take 2.5 mg by nebulization every 4 (four) hours as needed. [name]   albuterol 108 (90 Base) MCG/ACT inhaler Commonly known as: VENTOLIN HFA Inhale 2 puffs into the lungs every 4 (four) hours as needed. [name]   alendronate 70 MG tablet Commonly known as: FOSAMAX Take 70 mg by mouth once a week. Take with a full glass of water on an empty stomach. Takes on Monday   allopurinol 300 MG tablet Commonly known as: ZYLOPRIM Take 1 tablet (300 mg total) by mouth daily.   ALPRAZolam 1 MG tablet Commonly known as: XANAX Take 0.5 mg by mouth daily as needed for anxiety. Anxiety   aspirin 81 MG EC tablet Take 81 mg by mouth daily.   Aspirin Buf(CaCarb-MgCarb-MgO) 81 MG Tabs Take 1 tablet by mouth daily.   azelastine 0.05 % ophthalmic solution Commonly known as: OPTIVAR Place 1 drop into both eyes daily.   benzonatate 200 MG capsule Commonly known as: TESSALON Take 200 mg by mouth 3 (three) times daily as needed.     Bepreve 1.5 % Soln Generic drug: Bepotastine Besilate Place 1 drop into both eyes daily as needed. Dry eyes   bimatoprost 0.03 % ophthalmic solution Commonly known as: LUMIGAN Place 1 drop into both eyes at bedtime. Reported on 01/12/2016   Calcium-Vitamin D3 600-200 MG-UNIT Tabs Take 1 tablet by mouth 2 (two) times daily.   carvedilol 12.5 MG tablet Commonly known as: COREG TAKE ONE TABLET BY MOUTH TWICE DAILY   cetirizine 10 MG tablet Commonly known as: ZYRTEC Take 1 tablet by mouth daily.   cholecalciferol 1000 units tablet Commonly known as: VITAMIN D Take 1,000 Units by mouth 2 (two) times daily.  Colcrys 0.6 MG tablet Generic drug: colchicine TAKE ONE TABLET BY MOUTH EVERY DAY   cyclobenzaprine 10 MG tablet Commonly known as: Flexeril Take 1 tablet (10 mg total) by mouth 3 (three) times daily as needed for muscle spasms.   diclofenac sodium 1 % Gel Commonly known as: VOLTAREN APPLY THREE GRAMS TO THREE LARGE JOINTS UP TO THREE TIMES DAILY AS NEEDED   docusate sodium 100 MG capsule Commonly known as: COLACE Take 100 mg by mouth 2 (two) times daily.   Entresto 49-51 MG Generic drug: sacubitril-valsartan TAKE ONE TABLET BY MOUTH TWICE DAILY   fexofenadine 180 MG tablet Commonly known as: ALLEGRA Take 180 mg by mouth daily as needed. Allergies   furosemide 40 MG tablet Commonly known as: LASIX Take 1 tablet (40 mg total) by mouth 3 (three) times a week. Monday, Wednesday and Friday and as needed   HYDROcodone-acetaminophen 5-325 MG tablet Commonly known as: NORCO/VICODIN TAKE ONE TABLET BY MOUTH EVERY 6 HOURS PRN   ivabradine 5 MG Tabs tablet Commonly known as: [name] Take 0.5 tablets (2.5 mg total) by mouth 2 (two) times daily.   levothyroxine 25 MCG tablet Commonly known as: SYNTHROID Take 25 mcg by mouth daily.   montelukast 10 MG tablet Commonly known as: SINGULAIR Take 10 mg by mouth at bedtime. For allergies & congestion   multivitamin  tablet Take 1 tablet by mouth daily.   omeprazole 40 MG capsule Commonly known as: PRILOSEC Take 1 capsule by mouth daily   PARoxetine 20 MG tablet Commonly known as: PAXIL Take 20 mg by mouth daily.   rosuvastatin 5 MG tablet Commonly known as: CRESTOR Take 1 tablet (5 mg total) by mouth at bedtime.   spironolactone 25 MG tablet Commonly known as: ALDACTONE TAKE ONE TABLET BY MOUTH daily   SYSTANE OP Apply 1 drop to eye daily as needed (dry eyes).   traMADol 50 MG tablet Commonly known as: ULTRAM Take 50 mg by mouth every 6 (six) hours as needed for pain.   WELLNESS ESSENTIALS PO Take 4 tablets by mouth daily.       Review of systems negative except as noted in HPI / PMHx or noted below:  Review of Systems  Constitutional: Negative.   HENT: Negative.   Eyes: Negative.   Respiratory: Negative.   Cardiovascular: Negative.   Gastrointestinal: Negative.   Genitourinary: Negative.   Musculoskeletal: Negative.   Skin: Negative.   Neurological: Negative.   Endo/Heme/Allergies: Negative.   Psychiatric/Behavioral: Negative.     Family History  Problem Relation Age of Onset  . Hypertension Mother   . Glaucoma Mother   . Atrial fibrillation Mother   . Hypertension Father   . Hypertension Brother   . Hypertension Maternal Grandfather   . Heart attack Maternal Grandfather        MI  . Coronary artery disease Maternal Grandfather   . Hypertension Paternal Grandfather   . Heart attack Paternal Grandfather        MI  . Coronary artery disease Paternal Grandfather   . Hypertension Brother     Social History   Socioeconomic History  . Marital status: Married    Spouse name: Not on file  . Number of children: Not on file  . Years of education: Not on file  . Highest education level: Not on file  Occupational History  . Occupation: Oceanographer: [employer]  . Occupation: Retired from Set designer  . Smoking status:  Never Smoker    . Smokeless tobacco: Never Used  Substance and Sexual Activity  . Alcohol use: No  . Drug use: No  . Sexual activity: Not on file  Other Topics Concern  . Not on file  Social History Narrative   Married   One child   Lives in [place] with spouse and mother in law   Previously worked as a Art gallery manager for [employer]      She is a Leisure centre manager counsel member.    Environmental and Social history  Lives in a house with a dry environment, no animals located inside the household, carpet in the bedroom, plastic on the bed, plastic on the pillow, no smoking ongoing with inside the household.  Objective:   Vitals:   03/16/20 1508  BP: 98/68  Pulse: 88  Resp: 18  Temp: 98.2 F (36.8 C)  SpO2: 95%   Height: 5\' 2"  (157.5 cm) Weight: 272 lb (123.4 kg)  Physical Exam Constitutional:      Appearance: She is not diaphoretic.     Comments: Motorized wheelchair, very raspy voice  HENT:     Head: Normocephalic.     Right Ear: Tympanic membrane, ear canal and external ear normal.     Left Ear: Tympanic membrane, ear canal and external ear normal.     Nose: Nose normal. No mucosal edema or rhinorrhea.     Mouth/Throat:     Pharynx: Uvula midline. No oropharyngeal exudate.  Eyes:     Conjunctiva/sclera: Conjunctivae normal.  Neck:     Thyroid: No thyromegaly.     Trachea: Trachea normal. No tracheal tenderness or tracheal deviation.  Cardiovascular:     Rate and Rhythm: Normal rate and regular rhythm.     Heart sounds: Normal heart sounds, S1 normal and S2 normal. No murmur.  Pulmonary:     Effort: No respiratory distress.     Breath sounds: Normal breath sounds. No stridor. No wheezing or rales.  Lymphadenopathy:     Head:     Right side of head: No tonsillar adenopathy.     Left side of head: No tonsillar adenopathy.     Cervical: No cervical adenopathy.  Skin:    Findings: No erythema or rash.     Nails: There is no clubbing.  Neurological:     Mental Status: She is  alert.     Diagnostics: Allergy skin tests were not performed.    Results of blood test obtained 08 January 2020 identified WBC 8.9, absolute eosinophil 300, absolute lymphocyte 1500, hemoglobin 14.4, platelet 178.  Assessment and Plan:    1. LPRD (laryngopharyngeal reflux disease)     1.  Treat and prevent reflux:   A. Omeprazole 40 mg - 1 tablet 2 times per day  B. Famotidine 40 mg in evening  C. Consolidate caffeine slowly  2. Return to clinic in 4 weeks or earlier if problem.   We will initially approach Yisel's problem as one of reflux induced respiratory disease for the next 4 weeks and if she does not have a good response to this therapy then we need to consider having her remove sacubitril from her medical regime as this agent may be increasing her bradykinin levels and precipitating cough.  Of course, with her tenuous cardiac status much thought would need to be completed prior to removing this agent from her medical regime.  I will see her back in his clinic in 4 weeks or earlier if there is a  problem.  Jiles Prows, MD Allergy / Immunology [facility]

## 2020-03-17 ENCOUNTER — Encounter: Payer: Self-pay | Admitting: Allergy and Immunology

## 2020-03-17 NOTE — Progress Notes (Signed)
Agree. Thanks. Just let us know. Given her HF would like to save this as a last option but willing to consdier if needed given severity of her cough. Thanks -dan

## 2020-04-06 ENCOUNTER — Ambulatory Visit (INDEPENDENT_AMBULATORY_CARE_PROVIDER_SITE_OTHER): Payer: PRIVATE HEALTH INSURANCE | Admitting: *Deleted

## 2020-04-06 DIAGNOSIS — I428 Other cardiomyopathies: Secondary | ICD-10-CM

## 2020-04-06 LAB — CUP PACEART REMOTE DEVICE CHECK
Battery Remaining Longevity: 28 mo
Battery Remaining Percentage: 42 %
Battery Voltage: 2.92 V
Brady Statistic AP VP Percent: 5.7 %
Brady Statistic AP VS Percent: 1 %
Brady Statistic AS VP Percent: 93 %
Brady Statistic AS VS Percent: 1 %
Brady Statistic RA Percent Paced: 5.5 %
Date Time Interrogation Session: 20210607020016
HighPow Impedance: 96 Ohm
HighPow Impedance: 96 Ohm
Implantable Lead Implant Date: 20120914
Implantable Lead Implant Date: 20120914
Implantable Lead Implant Date: 20121116
Implantable Lead Location: 753858
Implantable Lead Location: 753859
Implantable Lead Location: 753860
Implantable Lead Model: 511212
Implantable Lead Serial Number: 194059
Implantable Pulse Generator Implant Date: 20170314
Lead Channel Impedance Value: 280 Ohm
Lead Channel Impedance Value: 400 Ohm
Lead Channel Impedance Value: 410 Ohm
Lead Channel Pacing Threshold Amplitude: 0.875 V
Lead Channel Pacing Threshold Amplitude: 1.125 V
Lead Channel Pacing Threshold Amplitude: 1.75 V
Lead Channel Pacing Threshold Pulse Width: 0.5 ms
Lead Channel Pacing Threshold Pulse Width: 0.5 ms
Lead Channel Pacing Threshold Pulse Width: 0.7 ms
Lead Channel Sensing Intrinsic Amplitude: 1.1 mV
Lead Channel Sensing Intrinsic Amplitude: 11.7 mV
Lead Channel Setting Pacing Amplitude: 1.875
Lead Channel Setting Pacing Amplitude: 2.125
Lead Channel Setting Pacing Amplitude: 2.75 V
Lead Channel Setting Pacing Pulse Width: 0.5 ms
Lead Channel Setting Pacing Pulse Width: 0.7 ms
Lead Channel Setting Sensing Sensitivity: 0.5 mV
Pulse Gen Serial Number: 7328650

## 2020-04-07 ENCOUNTER — Ambulatory Visit (INDEPENDENT_AMBULATORY_CARE_PROVIDER_SITE_OTHER): Payer: PRIVATE HEALTH INSURANCE

## 2020-04-07 DIAGNOSIS — Z95810 Presence of automatic (implantable) cardiac defibrillator: Secondary | ICD-10-CM | POA: Diagnosis not present

## 2020-04-07 DIAGNOSIS — I5022 Chronic systolic (congestive) heart failure: Secondary | ICD-10-CM

## 2020-04-08 NOTE — Progress Notes (Signed)
Remote ICD transmission.   

## 2020-04-10 NOTE — Progress Notes (Signed)
EPIC Encounter for ICM Monitoring  Patient Name: [name] is a 69 y.o. female Date: 04/10/2020 Primary Care Physican: [name], MD Primary Cardiologist:Bensimhon Electrophysiologist: Allred Bi-V Pacing: 99% 6/11/2021Weight:270lbs    Spoke with patient and reports feeling well at this time.  Denies fluid symptoms.  .   Corvue thoracic impedancenormal.    Prescribed:Furosemide 40 mg 1 tabletthree times a week Monday, Wed and Fridayas needed.Patient takes Furosemide 3 days week routinely.  Labs: 01/10/2020 Creatinine 1.14, BUN 19, Potassium 4.3, Sodium 143, GFR 49  Recommendations:No changes and encouraged to call if experiencing any fluid symptoms.  Follow-up plan: ICM clinic phone appointment on7/09/2020. 91 day device clinic remote transmission9/05/2020.   Copy of ICM check sent to Dr.Allred.  3 month ICM trend: 04/07/2020    1 Year ICM trend:       [name], RN 04/10/2020 8:36 AM

## 2020-04-16 ENCOUNTER — Ambulatory Visit: Payer: PRIVATE HEALTH INSURANCE | Admitting: Allergy and Immunology

## 2020-04-20 ENCOUNTER — Encounter: Payer: Self-pay | Admitting: Allergy and Immunology

## 2020-04-20 ENCOUNTER — Ambulatory Visit: Payer: PRIVATE HEALTH INSURANCE | Admitting: Allergy and Immunology

## 2020-04-20 ENCOUNTER — Other Ambulatory Visit: Payer: Self-pay

## 2020-04-20 VITALS — BP 102/68 | HR 88 | Resp 18

## 2020-04-20 DIAGNOSIS — M3500 Sicca syndrome, unspecified: Secondary | ICD-10-CM | POA: Diagnosis not present

## 2020-04-20 DIAGNOSIS — K219 Gastro-esophageal reflux disease without esophagitis: Secondary | ICD-10-CM

## 2020-04-20 MED ORDER — FAMOTIDINE 40 MG PO TABS: ORAL_TABLET | ORAL | 5 refills | Status: DC

## 2020-04-20 NOTE — Progress Notes (Signed)
[facility] [place] [facility] [place]   Follow-up Note  Referring Provider: [name], MD Primary Provider: [name], MD Date of Office Visit: 04/20/2020  Subjective:   [name] (DOB: [birth year]) is a 69 y.o. female who returns to the [facility] on 04/20/2020 in re-evaluation of the following:  HPI: [name] returns to this clinic in evaluation of cough believed to be secondary to LPR with possible component of ACE inhibitor use.  Her last visit to this clinic was her initial evaluation of 16 Mar 2020.  Utilizing therapy directed at LPR she has had a very significant improvement regarding her cough and the tickle in her throat and throat clearing is basically gone although she still has a little bit of raspy voice.  She still continues to drink about 16 ounces of soda per day.  She has been consistently using her omeprazole and famotidine.  She has a problem with dry mouth and she is using lozenges to deal with this issue.  This has apparently been a longstanding issue.  She continues on Sacubitril for her cardiomyopathy.  Allergies as of 04/20/2020      Reactions   Alphagan [brimonidine]    Irritation    Mobic [meloxicam] Other (See Comments)   Cannot take due to heart issue   Nsaids Other (See Comments)   Cannot take due to heart issue   Polyvinyl Alcohol Other (See Comments)   Travatan and Alphagan(irritation)   Statins Other (See Comments)   Per pt her cardiologist told her not to take   Tolmetin Other (See Comments)   Can not take due to heart issue   Travatan Z [travoprost (bak Free)]    Irritation   Tape Rash   Uncoded Allergy. Allergen: Tape Blister from clear tapes.      Medication List      albuterol (2.5 MG/3ML) 0.083% nebulizer solution Commonly known as: PROVENTIL Take 2.5 mg by nebulization every 4 (four) hours as needed. [name]   albuterol 108 (90 Base) MCG/ACT inhaler Commonly known  as: VENTOLIN HFA Inhale 2 puffs into the lungs every 4 (four) hours as needed. [name]   alendronate 70 MG tablet Commonly known as: FOSAMAX Take 70 mg by mouth once a week. Take with a full glass of water on an empty stomach. Takes on Monday   allopurinol 300 MG tablet Commonly known as: ZYLOPRIM Take 1 tablet (300 mg total) by mouth daily.   ALPRAZolam 1 MG tablet Commonly known as: XANAX Take 0.5 mg by mouth daily as needed for anxiety. Anxiety   aspirin 81 MG EC tablet Take 81 mg by mouth daily.   Aspirin Buf(CaCarb-MgCarb-MgO) 81 MG Tabs Take 1 tablet by mouth daily.   azelastine 0.05 % ophthalmic solution Commonly known as: OPTIVAR Place 1 drop into both eyes daily.   benzonatate 200 MG capsule Commonly known as: TESSALON Take 200 mg by mouth 3 (three) times daily as needed.   Bepreve 1.5 % Soln Generic drug: Bepotastine Besilate Place 1 drop into both eyes daily as needed. Dry eyes   bimatoprost 0.03 % ophthalmic solution Commonly known as: LUMIGAN Place 1 drop into both eyes at bedtime. Reported on 01/12/2016   Calcium-Vitamin D3 600-200 MG-UNIT Tabs Take 1 tablet by mouth 2 (two) times daily.   carvedilol 12.5 MG tablet Commonly known as: COREG TAKE ONE TABLET BY MOUTH TWICE DAILY   cetirizine 10 MG tablet Commonly known  as: ZYRTEC Take 1 tablet by mouth daily.   cholecalciferol 1000 units tablet Commonly known as: VITAMIN D Take 1,000 Units by mouth 2 (two) times daily.   Colcrys 0.6 MG tablet Generic drug: colchicine TAKE ONE TABLET BY MOUTH EVERY DAY   cyclobenzaprine 10 MG tablet Commonly known as: Flexeril Take 1 tablet (10 mg total) by mouth 3 (three) times daily as needed for muscle spasms.   diclofenac sodium 1 % Gel Commonly known as: VOLTAREN APPLY THREE GRAMS TO THREE LARGE JOINTS UP TO THREE TIMES DAILY AS NEEDED   docusate sodium 100 MG capsule Commonly known as: COLACE Take 100 mg by mouth 2 (two) times daily.     Entresto 49-51 MG Generic drug: sacubitril-valsartan TAKE ONE TABLET BY MOUTH TWICE DAILY   famotidine 40 MG tablet Commonly known as: PEPCID Take 1 tablet by mouth once every evening   fexofenadine 180 MG tablet Commonly known as: ALLEGRA Take 180 mg by mouth daily as needed. Allergies   furosemide 40 MG tablet Commonly known as: LASIX Take 1 tablet (40 mg total) by mouth 3 (three) times a week. Monday, Wednesday and Friday and as needed   HYDROcodone-acetaminophen 5-325 MG tablet Commonly known as: NORCO/VICODIN TAKE ONE TABLET BY MOUTH EVERY 6 HOURS PRN   ivabradine 5 MG Tabs tablet Commonly known as: [name] Take 0.5 tablets (2.5 mg total) by mouth 2 (two) times daily.   levothyroxine 25 MCG tablet Commonly known as: SYNTHROID Take 25 mcg by mouth daily.   montelukast 10 MG tablet Commonly known as: SINGULAIR Take 10 mg by mouth at bedtime. For allergies & congestion   multivitamin tablet Take 1 tablet by mouth daily.   omeprazole 40 MG capsule Commonly known as: PRILOSEC Take 1 capsule by mouth twice daily   omeprazole-sodium bicarbonate 40-1100 MG capsule Commonly known as: ZEGERID Take 1 capsule by mouth daily.   PARoxetine 20 MG tablet Commonly known as: PAXIL Take 20 mg by mouth daily.   rosuvastatin 5 MG tablet Commonly known as: CRESTOR Take 1 tablet (5 mg total) by mouth at bedtime.   spironolactone 25 MG tablet Commonly known as: ALDACTONE TAKE ONE TABLET BY MOUTH daily   SYSTANE OP Apply 1 drop to eye daily as needed (dry eyes).   traMADol 50 MG tablet Commonly known as: ULTRAM Take 50 mg by mouth every 6 (six) hours as needed for pain.   WELLNESS ESSENTIALS PO Take 4 tablets by mouth daily.       Past Medical History:  Diagnosis Date  . 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 Arthritis    lower back  . Atrial fibrillation ([place])    post op (left thoracotomy) 11/12 req amio/DCCV  . Breast cancer ([place]) 07/2009   Stage 3  lumpectomy, radiation, chemotherapy; finished chemo October, felt to be in remission  . Cardiac defibrillator in place    St. Jude (JA) 9/12; LV lead placement unsuccessful;  s/p left thoracotomy with placement of epicardial LV lead 09/2011 (Dr. PVT)  . CHF (congestive heart failure) ([place])   . Chronic systolic dysfunction of left ventricle   . Depression   . DVT (deep venous thrombosis) ([place]) 10/18/2010  . GERD (gastroesophageal reflux disease)   . Glaucoma   . Hiatal hernia    small hiatal hernia  . History of shingles   . Hyperlipidemia   . Hypertension   . Left bundle branch block   . Nonischemic cardiomyopathy ([place])    Admission  12/11 with EF 25%, normal coronary arteries by cath 12/11; NICM presumed to be secondary to chemotherapy for breast cancer  . Obesity   . [name]    with exertion    Past Surgical History:  Procedure Laterality Date  . BREAST LUMPECTOMY     Right breast  . CARDIAC CATHETERIZATION     2011  . CARDIAC DEFIBRILLATOR PLACEMENT    . EP IMPLANTABLE DEVICE N/A 01/12/2016   STJ ICD Unify Assura gen change, Dr. [name]  . OOPHORECTOMY     Single  . THORACOTOMY  09/16/2011   Procedure: THORACOTOMY MAJOR;  Surgeon: Tharon Aquas Adelene Idler, MD;  Location: [facility] OR;  Service: Thoracic;  Laterality: Left;  left anterolateral Thoracotomy for placement of St. Jude epicardial pacing lead   . TONSILLECTOMY    . TUBAL LIGATION     Bilateral    Review of systems negative except as noted in HPI / PMHx or noted below:  Review of Systems  Constitutional: Negative.   HENT: Negative.   Eyes: Negative.   Respiratory: Negative.   Cardiovascular: Negative.   Gastrointestinal: Negative.   Genitourinary: Negative.   Musculoskeletal: Negative.   Skin: Negative.   Neurological: Negative.   Endo/Heme/Allergies: Negative.   Psychiatric/Behavioral: Negative.      Objective:   Vitals:   04/20/20 1355  BP: 102/68  Pulse: 88  Resp: 18  SpO2: 96%           Physical Exam-deferred  Diagnostics: none  Assessment and Plan:   1. LPRD (laryngopharyngeal reflux disease)   2. [place], unspecified type ([place])     1.  Treat and prevent reflux:   A. Omeprazole 40 mg - 1 tablet 2 times per day  B. Famotidine 40 mg in evening  C. Consolidate caffeine slowly  2.  Do not use any antihistamines - Zyrtec/cetirizine, Allegra/fexofenadine  3.  Do not use montelukast  4. Return to clinic in 8 weeks or earlier if problem.   [name] will continue on therapy for LPR as this has really resulted in very good control of her cough.  Although there was concern that the use of her ACE inhibitor may also be contributing to her cough the fact that she has had such significant improvement while still using an ACE inhibitor would argue against that issue.  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  As well, we will see if she does just as well regarding control of her cough while she eliminates her montelukast.  Allena Katz, MD Allergy / [place]

## 2020-04-20 NOTE — Patient Instructions (Signed)
  1.  Treat and prevent reflux:   A. Omeprazole 40 mg - 1 tablet 2 times per day  B. Famotidine 40 mg in evening  C. Consolidate caffeine slowly  2.  Do not use any antihistamines -Zyrtec/cetirizine, Allegra/fexofenadine  3.  Do not use montelukast  4. Return to clinic in 8 weeks or earlier if problem.

## 2020-04-21 ENCOUNTER — Encounter: Payer: Self-pay | Admitting: Allergy and Immunology

## 2020-05-05 ENCOUNTER — Telehealth: Payer: Self-pay | Admitting: Student

## 2020-05-05 NOTE — Telephone Encounter (Signed)
New Message  Patient is calling in to speak with nurse. States that when she was last in office in June, she was told her next follow up would be in a year, but patient has received a recall letter for a follow up visit this month. Patient wants to know when she needs to be seen again in office. Please give a call to discuss.

## 2020-05-05 NOTE — Telephone Encounter (Signed)
Spoke with patient.  Advised recall appointment for [name], PA for this month which would be a yearly EP office visit check but that also coincides with the 3 month follow up that Jonni Sanger recommended after reprogramming her device in April.  She verbalized understanding.  Advised will have EP scheduler call her to set up appointment with Jonni Sanger for this month.  If she does not receive a call then to call the [facility] office for appointment.

## 2020-05-11 ENCOUNTER — Ambulatory Visit (INDEPENDENT_AMBULATORY_CARE_PROVIDER_SITE_OTHER): Payer: PRIVATE HEALTH INSURANCE

## 2020-05-11 DIAGNOSIS — I5022 Chronic systolic (congestive) heart failure: Secondary | ICD-10-CM | POA: Diagnosis not present

## 2020-05-11 DIAGNOSIS — Z95810 Presence of automatic (implantable) cardiac defibrillator: Secondary | ICD-10-CM

## 2020-05-12 NOTE — Progress Notes (Signed)
EPIC Encounter for ICM Monitoring  Patient Name: [name] is a 69 y.o. female Date: 05/12/2020 Primary Care Physican: [name], MD Primary Cardiologist:Bensimhon Electrophysiologist: Allred Bi-V Pacing: 99% 7/13/2021Weight:274lbs    Spoke with patient and reports feeling well at this time.  Denies fluid symptoms.  She's been [place] the beach for the past week and eating differently than normal.   Corvue thoracic impedancenormal.  Prescribed:Furosemide 40 mg 1 tabletthree times a week Monday, Wed and Fridayas needed.Patient takes Furosemide 3 days week routinely.  Labs: 01/10/2020 Creatinine 1.14, BUN 19, Potassium 4.3, Sodium 143, GFR 49  Recommendations:No changes and encouraged to call if experiencing any fluid symptoms.  Follow-up plan: ICM clinic phone appointment on8/16/2021. 91 day device clinic remote transmission9/05/2020.   EP/Cardiology Office Visits: 06/05/2020 with [name], [place] and 07/23/2020 with Dr Haroldine Laws.    Copy of ICM check sent to Dr. [name].   3 month ICM trend: 05/11/2020    1 Year ICM trend:       [name], RN 05/12/2020 1:18 PM

## 2020-06-05 ENCOUNTER — Encounter: Payer: PRIVATE HEALTH INSURANCE | Admitting: Student

## 2020-06-10 ENCOUNTER — Ambulatory Visit: Payer: PRIVATE HEALTH INSURANCE | Admitting: Allergy and Immunology

## 2020-06-15 ENCOUNTER — Ambulatory Visit (INDEPENDENT_AMBULATORY_CARE_PROVIDER_SITE_OTHER): Payer: PRIVATE HEALTH INSURANCE

## 2020-06-15 DIAGNOSIS — I5022 Chronic systolic (congestive) heart failure: Secondary | ICD-10-CM | POA: Diagnosis not present

## 2020-06-15 DIAGNOSIS — Z95810 Presence of automatic (implantable) cardiac defibrillator: Secondary | ICD-10-CM | POA: Diagnosis not present

## 2020-06-16 ENCOUNTER — Telehealth: Payer: Self-pay

## 2020-06-16 NOTE — Telephone Encounter (Signed)
Remote ICM transmission received.  Attempted call to patient regarding ICM remote transmission and left detailed message per DPR.  Advised to return call for any fluid symptoms or questions. Next ICM remote transmission scheduled 07/20/2020.     

## 2020-06-16 NOTE — Progress Notes (Signed)
EPIC Encounter for ICM Monitoring  Patient Name: [name] is a 69 y.o. female Date: 06/16/2020 Primary Care Physican: [name], MD Primary Cardiologist:Bensimhon Electrophysiologist: Allred Bi-V Pacing: 99% 7/13/2021Weight:274lbs    Attempted call to patient and unable to reach.  Left detailed message per DPR regarding transmission. Transmission reviewed.   Corvue thoracic impedancenormal but was suggesting possible fluid accumulation from8/11-8/14.  Prescribed:Furosemide 40 mg 1 tabletthree times a week Monday, Wed and Fridayas needed.Patient takes Furosemide 3 days week routinely.  Labs: 01/10/2020 Creatinine 1.14, BUN 19, Potassium 4.3, Sodium 143, GFR 49  Recommendations:Left voice mail with ICM number and encouraged to call if experiencing any fluid symptoms.  Follow-up plan: ICM clinic phone appointment on9/20/2021. 91 day device clinic remote transmission9/05/2020.   EP/Cardiology Office Visits:  07/23/2020 with Dr Haroldine Laws.    Copy of ICM check sent to Dr. [name].   3 month ICM trend: 06/15/2020    1 Year ICM trend:       [name], RN 06/16/2020 4:14 PM

## 2020-06-22 ENCOUNTER — Ambulatory Visit: Payer: PRIVATE HEALTH INSURANCE | Admitting: Allergy and Immunology

## 2020-07-07 ENCOUNTER — Other Ambulatory Visit: Payer: Self-pay | Admitting: Allergy and Immunology

## 2020-07-08 ENCOUNTER — Ambulatory Visit (INDEPENDENT_AMBULATORY_CARE_PROVIDER_SITE_OTHER): Payer: PRIVATE HEALTH INSURANCE | Admitting: *Deleted

## 2020-07-08 DIAGNOSIS — I429 Cardiomyopathy, unspecified: Secondary | ICD-10-CM

## 2020-07-08 LAB — CUP PACEART REMOTE DEVICE CHECK
Battery Remaining Longevity: 25 mo
Battery Remaining Percentage: 38 %
Battery Voltage: 2.9 V
Brady Statistic AP VP Percent: 5.3 %
Brady Statistic AP VS Percent: 1 %
Brady Statistic AS VP Percent: 93 %
Brady Statistic AS VS Percent: 1 %
Brady Statistic RA Percent Paced: 5.2 %
Date Time Interrogation Session: 20210908020016
HighPow Impedance: 115 Ohm
HighPow Impedance: 115 Ohm
Implantable Lead Implant Date: 20120914
Implantable Lead Implant Date: 20120914
Implantable Lead Implant Date: 20121116
Implantable Lead Location: 753858
Implantable Lead Location: 753859
Implantable Lead Location: 753860
Implantable Lead Model: 511212
Implantable Lead Serial Number: 194059
Implantable Pulse Generator Implant Date: 20170314
Lead Channel Impedance Value: 260 Ohm
Lead Channel Impedance Value: 400 Ohm
Lead Channel Impedance Value: 430 Ohm
Lead Channel Pacing Threshold Amplitude: 0.875 V
Lead Channel Pacing Threshold Amplitude: 1.125 V
Lead Channel Pacing Threshold Amplitude: 1.75 V
Lead Channel Pacing Threshold Pulse Width: 0.5 ms
Lead Channel Pacing Threshold Pulse Width: 0.5 ms
Lead Channel Pacing Threshold Pulse Width: 0.7 ms
Lead Channel Sensing Intrinsic Amplitude: 0.8 mV
Lead Channel Sensing Intrinsic Amplitude: 11.7 mV
Lead Channel Setting Pacing Amplitude: 1.875
Lead Channel Setting Pacing Amplitude: 2.125
Lead Channel Setting Pacing Amplitude: 2.75 V
Lead Channel Setting Pacing Pulse Width: 0.5 ms
Lead Channel Setting Pacing Pulse Width: 0.7 ms
Lead Channel Setting Sensing Sensitivity: 0.5 mV
Pulse Gen Serial Number: 7328650

## 2020-07-09 NOTE — Progress Notes (Signed)
Remote ICD transmission.   

## 2020-07-14 ENCOUNTER — Encounter: Payer: PRIVATE HEALTH INSURANCE | Admitting: Student

## 2020-07-20 ENCOUNTER — Ambulatory Visit (INDEPENDENT_AMBULATORY_CARE_PROVIDER_SITE_OTHER): Payer: PRIVATE HEALTH INSURANCE

## 2020-07-20 DIAGNOSIS — Z95810 Presence of automatic (implantable) cardiac defibrillator: Secondary | ICD-10-CM | POA: Diagnosis not present

## 2020-07-20 DIAGNOSIS — I5022 Chronic systolic (congestive) heart failure: Secondary | ICD-10-CM

## 2020-07-21 NOTE — Progress Notes (Signed)
EPIC Encounter for ICM Monitoring  Patient Name: [name] is a 69 y.o. female Date: 07/21/2020 Primary Care Physican: [name], MD Primary Cardiologist:Bensimhon Electrophysiologist: Allred Bi-V Pacing: 99% 7/13/2021Weight:274lbs    Transmission reviewed.   Corvue thoracic impedancesuggesting normal fluid levels.  Prescribed:Furosemide 40 mg 1 tabletthree times a week Monday, Wed and Fridayas needed.Patient takes Furosemide 3 days week routinely.  Labs: 01/10/2020 Creatinine 1.14, BUN 19, Potassium 4.3, Sodium 143, GFR 49  Recommendations: No Changes  Follow-up plan: ICM clinic phone appointment on10/25/2021. 91 day device clinic remote transmission12/03/2020.  EP/Cardiology Office Visits: 07/23/2020 with Dr Haroldine Laws. 07/24/2020 with Tommye Standard, PA.  Copy of ICM check sent to Dr.Allred.   3 month ICM trend: 07/20/2020    1 Year ICM trend:       [name], RN 07/21/2020 3:36 PM

## 2020-07-22 NOTE — Progress Notes (Signed)
ADVANCED HF CLINIC NOTE   Patient ID: EMMALIA HEYBOER, female   DOB: 1951/02/04, 69 y.o.   MRN: [id number] Oncologist: Dr. Philipp Ovens at [facility] EP: Dr. [name] Dr. Bea Graff in [place] HF: [place]  HPI: Ms. Gfeller is a 69 y.o. woman with a history of HTN, Type B aortic dissection (2010) managed with medical therapy by Dr. Prescott Gum and has healed, LBBB, systolic heart failure due to NICM with EF 10-15% Cath Dec 2011 showed normal arteries.    She was diagnosed with breast cancer in 2009 s/p lumpectomy, XRT, and chemotherapy.  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   She underwent St Jude Bi-V ICD implantation Sept 2012 but unable to place LV lead.  She eventually brought back in Nov 2012 for left thoracotomy and placement of epicardial LV lead placement by Dr. Prescott Gum.  She also had a sleep study that showed mild OSA.  No clinical intervention occurred.    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 Started PPI and famotidine. Question also raised about possible trial off ARNI.   She presents today for regular follow up. Previously corlanor cut back to 2.5 mg BID with occasional bradycardia. Since we last saw her underwent AV optimization which she is not sure it helped. Has good days and bad days. On a good day can do ADLs but cannot go to store without electric WC. No orthopnea or PND. + edema L>R. Taking lasix 40 MWF.    Echo today 07/23/20 EF 20-25% with dyssynchrony RV ok Personally reviewed   Echo 01/10/20: LVEF 10% with severe dyssynchrony. RV ok    Echo 12/2017 LVEF 20%, RV ok.    10/10/2012 CPX Peak VO2: 10.6 % predicted peak VO2: 64.4% VE/VCO2 slope: 26.9 OUES: 1.18 Peak RER: 1.29 Ventilatory Threshold: 7.3 % predicted peak VO2: 44.4% Peak RR 23 Peak Ventilation: 29.8 VE/MVV: 32.6% PETCO2 at peak: 43  01/2014 CPX Peak VO2: 10.3 (66.4% predicted peak  VO2) VE/VCO2 slope: 25.0 OUES: 1.30 Peak RER: 1.20  CPX (8/16): Peak VO2 10.1 VE/VCO2 slope 28.4 RER 1.14 OUES 1.3 PFTs mildly restrictive No significant change compared to prior.  Limited more by obesity and restrictive lung physiology than heart (mild circulatory limitation).   ECHO 01/02/13 EF 25% Grade 1 diastolic dysfunction. RV ok. No PAH ECHO 07/10/13 EF 20% RV ok ECHO 10/13/14 EF 15-20% RV ok Echo 10/2015 EF 15%  Echo 09/30/16 LVEF 10-15%, Grade 1 DD, mild PI, Mild TR. Normal RV.  Review of systems complete and found to be negative unless listed in HPI.      Past Medical History:  Diagnosis Date  . Aortic dissection ([place]) 12/2008   Type B  . Arthritis    lower back  . Atrial fibrillation ([place])    post op (left thoracotomy) 11/12 req amio/DCCV  . Breast cancer ([place]) 07/2009   Stage 3 lumpectomy, radiation, chemotherapy; finished chemo October, felt to be in remission  . Cardiac defibrillator in place    St. Jude (JA) 9/12; LV lead placement unsuccessful;  s/p left thoracotomy with placement of epicardial LV lead 09/2011 (Dr. PVT)  . CHF (congestive heart failure) ([place])   . Chronic systolic dysfunction of left ventricle   . Depression   . DVT (deep venous thrombosis) ([place]) 10/18/2010  . GERD (gastroesophageal reflux disease)   . Glaucoma   . Hiatal hernia    small hiatal  hernia  . History of shingles   . Hyperlipidemia   . Hypertension   . Left bundle branch block   . Nonischemic cardiomyopathy ([place])    Admission 12/11 with EF 25%, normal coronary arteries by cath 12/11; NICM presumed to be secondary to chemotherapy for breast cancer  . Obesity   . [name]    with exertion   Current Outpatient Medications  Medication Sig Dispense Refill  . albuterol (PROVENTIL HFA;VENTOLIN HFA) 108 (90 BASE) MCG/ACT inhaler Inhale 2 puffs into the lungs every 4 (four) hours as needed. [name]     . albuterol (PROVENTIL) (2.5 MG/3ML) 0.083% nebulizer  solution Take 2.5 mg by nebulization every 4 (four) hours as needed. [name]     . alendronate (FOSAMAX) 70 MG tablet Take 70 mg by mouth once a week. Take with a full glass of water on an empty stomach. Takes on Monday    . allopurinol (ZYLOPRIM) 300 MG tablet Take 1 tablet (300 mg total) by mouth daily. 90 tablet 1  . ALPRAZolam (XANAX) 1 MG tablet Take 0.5 mg by mouth daily as needed for anxiety. Anxiety     . aspirin 81 MG EC tablet Take 81 mg by mouth daily.      . Aspirin Buf,CaCarb-MgCarb-MgO, 81 MG TABS Take 1 tablet by mouth daily.    [name] azelastine (OPTIVAR) 0.05 % ophthalmic solution Place 1 drop into both eyes daily.    . benzonatate (TESSALON) 200 MG capsule Take 200 mg by mouth 3 (three) times daily as needed.    . Bepotastine Besilate (BEPREVE) 1.5 % SOLN Place 1 drop into both eyes daily as needed. Dry eyes     . bimatoprost (LUMIGAN) 0.03 % ophthalmic solution Place 1 drop into both eyes at bedtime. Reported on 01/12/2016    . Calcium Carbonate-Vitamin D (CALCIUM-VITAMIN D3) 600-200 MG-UNIT TABS Take 1 tablet by mouth 2 (two) times daily.      . carvedilol (COREG) 12.5 MG tablet TAKE ONE TABLET BY MOUTH TWICE DAILY 180 tablet 3  . cetirizine (ZYRTEC) 10 MG tablet Take 1 tablet by mouth daily.    . cholecalciferol (VITAMIN D) 1000 UNITS tablet Take 1,000 Units by mouth 2 (two) times daily.      [name] COLCRYS 0.6 MG tablet TAKE ONE TABLET BY MOUTH EVERY DAY 30 tablet 1  . cyclobenzaprine (FLEXERIL) 10 MG tablet Take 1 tablet (10 mg total) by mouth 3 (three) times daily as needed for muscle spasms. 30 tablet 0  . diclofenac sodium (VOLTAREN) 1 % GEL APPLY THREE GRAMS TO THREE LARGE JOINTS UP TO THREE TIMES DAILY AS NEEDED 3 Tube 3  . docusate sodium (COLACE) 100 MG capsule Take 100 mg by mouth 2 (two) times daily.    [name] ENTRESTO 49-51 MG TAKE ONE TABLET BY MOUTH TWICE DAILY 180 tablet 3  . famotidine (PEPCID) 40 MG tablet Take 1 tablet by mouth once every evening 30 tablet 5   . fexofenadine (ALLEGRA) 180 MG tablet Take 180 mg by mouth daily as needed. Allergies     . furosemide (LASIX) 40 MG tablet Take 1 tablet (40 mg total) by mouth 3 (three) times a week. Monday, Wednesday and Friday and as needed 90 tablet 3  . HYDROcodone-acetaminophen (NORCO/VICODIN) 5-325 MG tablet TAKE ONE TABLET BY MOUTH EVERY 6 HOURS PRN    . ivabradine ([name]) 5 MG TABS tablet Take 0.5 tablets (2.5 mg total) by mouth 2 (two) times daily. 60 tablet  3  . levothyroxine (SYNTHROID) 25 MCG tablet Take 25 mcg by mouth daily.    . montelukast (SINGULAIR) 10 MG tablet Take 10 mg by mouth at bedtime. For allergies & congestion    . Multiple Vitamin (MULTIVITAMIN) tablet Take 1 tablet by mouth daily.      . Nutritional Supplements (WELLNESS ESSENTIALS PO) Take 4 tablets by mouth daily.    [name] 40 MG capsule TAKE ONE CAPSULE BY MOUTH TWICE DAILY 60 capsule 0  . omeprazole-sodium bicarbonate (ZEGERID) 40-1100 MG capsule Take 1 capsule by mouth daily.    [name] PARoxetine (PAXIL) 20 MG tablet Take 20 mg by mouth daily.  1  . Polyethyl Glycol-Propyl Glycol (SYSTANE OP) Apply 1 drop to eye daily as needed (dry eyes).     . rosuvastatin (CRESTOR) 5 MG tablet Take 1 tablet (5 mg total) by mouth at bedtime. 90 tablet 3  . spironolactone (ALDACTONE) 25 MG tablet TAKE ONE TABLET BY MOUTH daily 90 tablet 3  . traMADol (ULTRAM) 50 MG tablet Take 50 mg by mouth every 6 (six) hours as needed for pain.     No current facility-administered medications for this encounter.   PHYSICAL EXAM: Vitals:   07/23/20 1141  BP: (!) 100/56  Pulse: 77  SpO2: 96%  Weight: 126.4 kg (278 lb 9.6 oz)  Height: 5\' 2"  (1.575 m)   Wt Readings from Last 3 Encounters:  03/16/20 123.4 kg (272 lb)  01/10/20 124.7 kg (275 lb)  07/24/19 122.6 kg (270 lb 3.2 oz)   Physical Exam  General:  Obese woman sitting in WC No resp difficulty HEENT: normal Neck: supple. no JVD. Carotids 2+ bilat; no bruits. No  lymphadenopathy or thryomegaly appreciated. Cor: PMI nondisplaced. Regular rate & rhythm. No rubs, gallops or murmurs. Lungs: clear Abdomen: soft, nontender, nondistended. No hepatosplenomegaly. No bruits or masses. Good bowel sounds. Extremities: no cyanosis, clubbing, rash, tr-1++ L>R edema Neuro: alert & orientedx3, cranial nerves grossly intact. moves all 4 extremities w/o difficulty. Affect pleasant  ICD interrogation: No VT/AF. Corevue looks good (dry) 99% BivP Personally reviewed   ASSESSMENT & PLAN: 1. Chronic systolic CHF: Nonischemic cardiomyopathy.   - Echo 09/30/16 LVEF 10-15% s/p St Jude Bi-V ICD with epicardial LV lead.  - Echo 12/2017 EF 20% stable. - CPX in 8/16 was similar to the past: it appeared that the major limitation was from body habitus/deconditioning and not cardiac - Echo 01/10/20 LVEF 10-15% severe dyssynchrony, RV ok  - Underwent AV optimization of CRT in 4/21 QRS 170 -> 165ms - Echo today 07/23/20 EF 20-25% with dyssynchrony RV ok Personally reviewed - Stable NYHA III-IIIB symptoms, confounded by severe arthritis.  - Seems a bit better after AV optimization - says she is going back to EP tomorrow for further optimization attempts - Volume status ok  - Continue Entresto 49/51 mg BID. Unable to titrate due to low BP - Continue lasix 40 mg M/W/F. Can take extra as needed.  - Continue coreg 12.5 mg BID.  - Continue spironolactone 25 daily.  - Continue corlanor 2.5 bid - We discussed SGLT2i but concern for perineal hygiene so will avoid - Likely would not qualify for advanced therapies with her comorbidities.  - ICD interrogated personally in Clinic. No VT/AF. Impedance ok. 99% biv pacing 2. Paroxysmal atrial fibrillation:  - This was post-op.  - In NSR today - Continue ASA 81 mg daily.  - If recurs, will need antiocoag with CHA2DS2/VAS of 4.  3. Type B  aortic dissection:  - Stable. BP stable.  4. HTN  - BP runs low. Remains asymptomatic  5. Arthritis -  Knees and Hips. Not candidate for surgery with severe LV dysfunction.  - No change 6. HL  - Continue crestor   Glori Bickers, MD 07/22/2020

## 2020-07-23 ENCOUNTER — Ambulatory Visit (HOSPITAL_COMMUNITY)
Admission: RE | Admit: 2020-07-23 | Discharge: 2020-07-23 | Disposition: A | Discharge status: Home / Self Care | Payer: PRIVATE HEALTH INSURANCE | Source: Ambulatory Visit | Attending: Internal Medicine | Admitting: Internal Medicine

## 2020-07-23 ENCOUNTER — Encounter (HOSPITAL_COMMUNITY): Payer: Self-pay | Admitting: Internal Medicine

## 2020-07-23 ENCOUNTER — Ambulatory Visit (HOSPITAL_BASED_OUTPATIENT_CLINIC_OR_DEPARTMENT_OTHER)
Admission: RE | Admit: 2020-07-23 | Discharge: 2020-07-23 | Disposition: A | Discharge status: Home / Self Care | Payer: PRIVATE HEALTH INSURANCE | Source: Ambulatory Visit | Attending: Internal Medicine | Admitting: Internal Medicine

## 2020-07-23 ENCOUNTER — Other Ambulatory Visit: Payer: Self-pay

## 2020-07-23 VITALS — BP 100/56 | HR 77 | Ht 62.0 in | Wt 278.6 lb

## 2020-07-23 DIAGNOSIS — Z853 Personal history of malignant neoplasm of breast: Secondary | ICD-10-CM | POA: Diagnosis not present

## 2020-07-23 DIAGNOSIS — Z95810 Presence of automatic (implantable) cardiac defibrillator: Secondary | ICD-10-CM | POA: Insufficient documentation

## 2020-07-23 DIAGNOSIS — I447 Left bundle-branch block, unspecified: Secondary | ICD-10-CM | POA: Insufficient documentation

## 2020-07-23 DIAGNOSIS — K219 Gastro-esophageal reflux disease without esophagitis: Secondary | ICD-10-CM | POA: Insufficient documentation

## 2020-07-23 DIAGNOSIS — M1990 Unspecified osteoarthritis, unspecified site: Secondary | ICD-10-CM | POA: Insufficient documentation

## 2020-07-23 DIAGNOSIS — I480 Paroxysmal atrial fibrillation: Secondary | ICD-10-CM | POA: Insufficient documentation

## 2020-07-23 DIAGNOSIS — Z86718 Personal history of other venous thrombosis and embolism: Secondary | ICD-10-CM | POA: Diagnosis not present

## 2020-07-23 DIAGNOSIS — G4733 Obstructive sleep apnea (adult) (pediatric): Secondary | ICD-10-CM | POA: Diagnosis not present

## 2020-07-23 DIAGNOSIS — Z7982 Long term (current) use of aspirin: Secondary | ICD-10-CM | POA: Insufficient documentation

## 2020-07-23 DIAGNOSIS — F329 Major depressive disorder, single episode, unspecified: Secondary | ICD-10-CM | POA: Insufficient documentation

## 2020-07-23 DIAGNOSIS — Z9221 Personal history of antineoplastic chemotherapy: Secondary | ICD-10-CM | POA: Diagnosis not present

## 2020-07-23 DIAGNOSIS — Z4502 Encounter for adjustment and management of automatic implantable cardiac defibrillator: Secondary | ICD-10-CM

## 2020-07-23 DIAGNOSIS — I110 Hypertensive heart disease with heart failure: Secondary | ICD-10-CM | POA: Diagnosis present

## 2020-07-23 DIAGNOSIS — E785 Hyperlipidemia, unspecified: Secondary | ICD-10-CM | POA: Diagnosis not present

## 2020-07-23 DIAGNOSIS — E669 Obesity, unspecified: Secondary | ICD-10-CM | POA: Diagnosis not present

## 2020-07-23 DIAGNOSIS — I10 Essential (primary) hypertension: Secondary | ICD-10-CM

## 2020-07-23 DIAGNOSIS — Z79890 Hormone replacement therapy (postmenopausal): Secondary | ICD-10-CM | POA: Insufficient documentation

## 2020-07-23 DIAGNOSIS — Z6841 Body Mass Index (BMI) 40.0 and over, adult: Secondary | ICD-10-CM | POA: Diagnosis not present

## 2020-07-23 DIAGNOSIS — I5022 Chronic systolic (congestive) heart failure: Secondary | ICD-10-CM

## 2020-07-23 DIAGNOSIS — Z923 Personal history of irradiation: Secondary | ICD-10-CM | POA: Insufficient documentation

## 2020-07-23 DIAGNOSIS — I428 Other cardiomyopathies: Secondary | ICD-10-CM | POA: Insufficient documentation

## 2020-07-23 DIAGNOSIS — Z79899 Other long term (current) drug therapy: Secondary | ICD-10-CM | POA: Insufficient documentation

## 2020-07-23 LAB — ECHOCARDIOGRAM COMPLETE
AR max vel: 2.05 cm2
AV Area VTI: 1.93 cm2
AV Area mean vel: 1.97 cm2
AV Mean grad: 4 mmHg
AV Peak grad: 7.7 mmHg
Ao pk vel: 1.39 m/s
Area-P 1/2: 3.83 cm2
S' Lateral: 4.9 cm

## 2020-07-23 MED ORDER — PERFLUTREN LIPID MICROSPHERE
1.0000 mL | INTRAVENOUS | Status: DC | PRN
  Administered 2020-07-23: 2 mL via INTRAVENOUS
  Filled 2020-07-23: qty 10

## 2020-07-23 NOTE — Progress Notes (Signed)
  Echocardiogram 2D Echocardiogram has been performed.  [name] 07/23/2020, 11:36 AM

## 2020-07-23 NOTE — Patient Instructions (Signed)
Please call our office in February to schedule your follow up appointment  If you have any questions or concerns before your next appointment please send [place] a message through [place] or call our office at [phone].    TO LEAVE A MESSAGE FOR THE NURSE SELECT OPTION 2, PLEASE LEAVE A MESSAGE INCLUDING: . YOUR NAME . DATE OF BIRTH . CALL BACK NUMBER . REASON FOR CALL**this is important as we prioritize the call backs  [place] AS LONG AS YOU CALL BEFORE 4:00 PM  At the [facility], you and your health needs are our priority. As part of our continuing mission to provide you with exceptional heart care, we have created designated Provider Care Teams. These Care Teams include your primary Cardiologist (physician) and Advanced Practice Providers (APPs- Physician Assistants and Nurse Practitioners) who all work together to provide you with the care you need, when you need it.   You may see any of the following providers on your designated Care Team at your next follow up: [name] Dr Glori Bickers . Dr Loralie Champagne . Darrick Grinder, NP . Lyda Jester, PA . Audry Riles, PharmD   Please be sure to bring in all your medications bottles to every appointment.

## 2020-07-24 ENCOUNTER — Other Ambulatory Visit: Payer: Self-pay

## 2020-07-24 ENCOUNTER — Ambulatory Visit: Payer: PRIVATE HEALTH INSURANCE | Admitting: Physician Assistant

## 2020-07-24 VITALS — BP 112/70 | HR 98 | Ht 62.0 in | Wt 277.0 lb

## 2020-07-24 DIAGNOSIS — Z95810 Presence of automatic (implantable) cardiac defibrillator: Secondary | ICD-10-CM | POA: Diagnosis not present

## 2020-07-24 DIAGNOSIS — R42 Dizziness and giddiness: Secondary | ICD-10-CM | POA: Diagnosis not present

## 2020-07-24 DIAGNOSIS — I5022 Chronic systolic (congestive) heart failure: Secondary | ICD-10-CM

## 2020-07-24 NOTE — Patient Instructions (Signed)
Medication Instructions:   Your physician recommends that you continue on your current medications as directed. Please refer to the Current Medication list given to you today.  *If you need a refill on your cardiac medications before your next appointment, please call your pharmacy*   Lab Work: NONE ORDERED  TODAY   If you have labs (blood work) drawn today and your tests are completely normal, you will receive your results only by: . MyChart Message (if you have MyChart) OR . A paper copy in the mail If you have any lab test that is abnormal or we need to change your treatment, we will call you to review the results.   Testing/Procedures: NONE ORDERED  TODAY   Follow-Up: At [facility], you and your health needs are our priority.  As part of our continuing mission to provide you with exceptional heart care, we have created designated Provider Care Teams.  These Care Teams include your primary Cardiologist (physician) and Advanced Practice Providers (APPs -  Physician Assistants and Nurse Practitioners) who all work together to provide you with the care you need, when you need it.  We recommend signing up for the patient portal called "MyChart".  Sign up information is provided on this After Visit Summary.  MyChart is used to connect with patients for Virtual Visits (Telemedicine).  Patients are able to view lab/test results, encounter notes, upcoming appointments, etc.  Non-urgent messages can be sent to your provider as well.   To learn more about what you can do with MyChart, go to [email or web address].    Your next appointment:   1 year(s)  The format for your next appointment:   In Person  Provider:   You may see James Allred, MD or one of the following Advanced Practice Providers on your designated Care Team:    Amber Seiler, NP  Renee Ursuy, PA-C  Michael "Andy" Tillery, PA-C    Other Instructions   

## 2020-07-24 NOTE — Progress Notes (Signed)
Electrophysiology Office Note Date: 07/24/2020  ID:  [name], DOB [birth year], MRN [id number]  PCP: [name], MD Primary Cardiologist: Dr. Rebek[facility] Electrophysiologist: Allred  CC:  annual EP/device visit ?  [name] is a 69 y.o. female with PMHx of NICM suspect 2/2 chemo tx,. Cath Dec 2011 showed normal arteries, Type B aortic dissection treated medically, LBBB, HTN, HLD, CHF, an event of post-op AF in 2012, breast cancer with lumpectomy chemo and Rad tx comes today to be seen for Dr. [name].  She last saw EP service by A. Tillery, [place] 02/24/20 for AV optimization. Noted she had a number of prior attempts to optimize her device  No alternative LV vectors given is an epicardial lead. At adjustments of sensed AV delay 140 ms, and LV -> RV by 45 ms, QRS improved to 132 ms. Remains mostly positive in lead 1, and mostly negative in V1. No vectors to try with epicardial wires.  Intrinsic IVCD of 110 ms makes further optimization difficult.  On discharge pt programmed LV -> RV by 45 ms Sensed AV delay 140 ms  She saw Dr. Haroldine Laws 5/73/22 Uncertain if optimization helped, had good days and bad, still required electric WC for groceries, echo done there he noted EF 20-25% with dyssynchrony RV ok Most likely not a candidate for advanced therapies, no changes to meds, device was interrogated without arrhythmias, and impedance was OK, BP low but asymptomatic.  TODAY She is doing "OK" Since her device optimization, she does not think she has noted any particular or notable improvement, though no worse either. She feels like her decline has been fairly slow and steady, but perhaps has gotten to a plateau of sorts with no real decline in a while. She says she ambulates at home with her walker or cane and does pretty good, when out to the store (which she does not to much) will use the scooter. She came today using her walker, says she gets winded, though recovers  fairly quickly Denies rest SOB. No CP, no palpitations, no near syncope or syncope, she will occassionally have some orthostatic lightheadedness. She mentions that Dr. Haroldine Laws was OK with her lasix regime, and L. Short also keeps track of her  Device History: STJ CRT-D implanted 2012 for NICM, LBBB, CHF (epicardial LV lead placed by [facility] 2012), gen change 01/12/16 History of appropriate therapy: No She was shocked once post epicardial lead for RAFib, none since.  I do not see any other mention of therapies   History of AAD therapy: No  Past Medical History:  Diagnosis Date  . Aortic dissection ([place]) 12/2008   Type B  . Arthritis    lower back  . Atrial fibrillation ([place])    post op (left thoracotomy) 11/12 req amio/DCCV  . Breast cancer ([place]) 07/2009   Stage 3 lumpectomy, radiation, chemotherapy; finished chemo October, felt to be in remission  . Cardiac defibrillator in place    St. Jude (JA) 9/12; LV lead placement unsuccessful;  s/p left thoracotomy with placement of epicardial LV lead 09/2011 (Dr. PVT)  . CHF (congestive heart failure) ([place])   . Chronic systolic dysfunction of left ventricle   . Depression   . DVT (deep venous thrombosis) ([place]) 10/18/2010  . GERD (gastroesophageal reflux disease)   . Glaucoma   . Hiatal hernia    small hiatal hernia  . History of shingles   . Hyperlipidemia   . Hypertension   . Left bundle branch  block   . Nonischemic cardiomyopathy ([place])    Admission 12/11 with EF 25%, normal coronary arteries by cath 12/11; NICM presumed to be secondary to chemotherapy for breast cancer  . Obesity   . [name]    with exertion   Past Surgical History:  Procedure Laterality Date  . BREAST LUMPECTOMY     Right breast  . CARDIAC CATHETERIZATION     2011  . CARDIAC DEFIBRILLATOR PLACEMENT    . EP IMPLANTABLE DEVICE N/A 01/12/2016   STJ ICD Unify Assura gen change, Dr. [name]  . OOPHORECTOMY     Single  . THORACOTOMY  09/16/2011    Procedure: THORACOTOMY MAJOR;  Surgeon: Tharon Aquas Adelene Idler, MD;  Location: [facility] OR;  Service: Thoracic;  Laterality: Left;  left anterolateral Thoracotomy for placement of St. Jude epicardial pacing lead   . TONSILLECTOMY    . TUBAL LIGATION     Bilateral    Current Outpatient Medications  Medication Sig Dispense Refill  . albuterol (PROVENTIL HFA;VENTOLIN HFA) 108 (90 BASE) MCG/ACT inhaler Inhale 2 puffs into the lungs every 4 (four) hours as needed. [name]     . albuterol (PROVENTIL) (2.5 MG/3ML) 0.083% nebulizer solution Take 2.5 mg by nebulization every 4 (four) hours as needed. [name]     . alendronate (FOSAMAX) 70 MG tablet Take 70 mg by mouth once a week. Take with a full glass of water on an empty stomach. Takes on Monday    . allopurinol (ZYLOPRIM) 300 MG tablet Take 1 tablet (300 mg total) by mouth daily. 90 tablet 1  . ALPRAZolam (XANAX) 1 MG tablet Take 0.5 mg by mouth daily as needed for anxiety. Anxiety     . aspirin 81 MG EC tablet Take 81 mg by mouth daily.      . Aspirin Buf,CaCarb-MgCarb-MgO, 81 MG TABS Take 1 tablet by mouth daily.    [name] azelastine (OPTIVAR) 0.05 % ophthalmic solution Place 1 drop into both eyes daily.    . benzonatate (TESSALON) 200 MG capsule Take 200 mg by mouth 3 (three) times daily as needed.    . Bepotastine Besilate (BEPREVE) 1.5 % SOLN Place 1 drop into both eyes daily as needed. Dry eyes     . bimatoprost (LUMIGAN) 0.03 % ophthalmic solution Place 1 drop into both eyes at bedtime. Reported on 01/12/2016    . Calcium Carbonate-Vitamin D (CALCIUM-VITAMIN D3) 600-200 MG-UNIT TABS Take 1 tablet by mouth 2 (two) times daily.      . carvedilol (COREG) 12.5 MG tablet TAKE ONE TABLET BY MOUTH TWICE DAILY 180 tablet 3  . cetirizine (ZYRTEC) 10 MG tablet Take 1 tablet by mouth daily.    . cholecalciferol (VITAMIN D) 1000 UNITS tablet Take 1,000 Units by mouth 2 (two) times daily.      [name] COLCRYS 0.6 MG tablet TAKE ONE TABLET BY MOUTH  EVERY DAY (Patient not taking: Reported on 07/23/2020) 30 tablet 1  . cyclobenzaprine (FLEXERIL) 10 MG tablet Take 1 tablet (10 mg total) by mouth 3 (three) times daily as needed for muscle spasms. 30 tablet 0  . diclofenac sodium (VOLTAREN) 1 % GEL APPLY THREE GRAMS TO THREE LARGE JOINTS UP TO THREE TIMES DAILY AS NEEDED 3 Tube 3  . docusate sodium (COLACE) 100 MG capsule Take 100 mg by mouth 2 (two) times daily. (Patient not taking: Reported on 07/23/2020)    . ENTRESTO 49-51 MG TAKE ONE TABLET BY MOUTH TWICE DAILY 180 tablet 3  .  famotidine (PEPCID) 40 MG tablet Take 1 tablet by mouth once every evening 30 tablet 5  . fexofenadine (ALLEGRA) 180 MG tablet Take 180 mg by mouth daily as needed. Allergies  (Patient not taking: Reported on 07/23/2020)    . furosemide (LASIX) 40 MG tablet Take 1 tablet (40 mg total) by mouth 3 (three) times a week. Monday, Wednesday and Friday and as needed 90 tablet 3  . HYDROcodone-acetaminophen (NORCO/VICODIN) 5-325 MG tablet TAKE ONE TABLET BY MOUTH EVERY 6 HOURS PRN    . ivabradine ([name]) 5 MG TABS tablet Take 0.5 tablets (2.5 mg total) by mouth 2 (two) times daily. 60 tablet 3  . levothyroxine (SYNTHROID) 25 MCG tablet Take 25 mcg by mouth daily.    . montelukast (SINGULAIR) 10 MG tablet Take 10 mg by mouth at bedtime. For allergies & congestion    . Multiple Vitamin (MULTIVITAMIN) tablet Take 1 tablet by mouth daily.      . Nutritional Supplements (WELLNESS ESSENTIALS PO) Take 4 tablets by mouth daily.    [name] 40 MG capsule TAKE ONE CAPSULE BY MOUTH TWICE DAILY 60 capsule 0  . omeprazole-sodium bicarbonate (ZEGERID) 40-1100 MG capsule Take 1 capsule by mouth daily.    [name] PARoxetine (PAXIL) 20 MG tablet Take 20 mg by mouth daily.  1  . Polyethyl Glycol-Propyl Glycol (SYSTANE OP) Apply 1 drop to eye daily as needed (dry eyes).     . rosuvastatin (CRESTOR) 5 MG tablet Take 1 tablet (5 mg total) by mouth at bedtime. 90 tablet 3  .  spironolactone (ALDACTONE) 25 MG tablet TAKE ONE TABLET BY MOUTH daily 90 tablet 3  . traMADol (ULTRAM) 50 MG tablet Take 50 mg by mouth every 6 (six) hours as needed for pain.     No current facility-administered medications for this visit.    Allergies:   Alphagan [brimonidine], Mobic [meloxicam], Nsaids, Polyvinyl alcohol, Statins, Tolmetin, Travatan z [travoprost (bak free)], and Tape   Social History: Social History   Socioeconomic History  . Marital status: Married    Spouse name: Not on file  . Number of children: Not on file  . Years of education: Not on file  . Highest education level: Not on file  Occupational History  . Occupation: Oceanographer: [employer]  . Occupation: Retired from Set designer  . Smoking status: Never Smoker  . Smokeless tobacco: Never Used  Vaping Use  . Vaping Use: Never used  Substance and Sexual Activity  . Alcohol use: No  . Drug use: No  . Sexual activity: Not on file  Other Topics Concern  . Not on file  Social History Narrative   Married   One child   Lives in [place] with spouse and mother in law   Previously worked as a Art gallery manager for [employer]      She is a Leisure centre manager counsel member.   Social Determinants of Health   Financial Resource Strain:   . Difficulty of Paying Living Expenses: Not on file  Food Insecurity:   . Worried About Charity fundraiser in the Last Year: Not on file  . Ran Out of Food in the Last Year: Not on file  Transportation Needs:   . Lack of Transportation (Medical): Not on file  . Lack of Transportation (Non-Medical): Not on file  Physical Activity:   . Days of Exercise per Week: Not on file  . Minutes of Exercise per  Session: Not on file  Stress:   . Feeling of Stress : Not on file  Social Connections:   . Frequency of Communication with Friends and Family: Not on file  . Frequency of Social Gatherings with Friends and Family: Not on file  . Attends Religious  Services: Not on file  . Active Member of Clubs or Organizations: Not on file  . Attends Archivist Meetings: Not on file  . Marital Status: Not on file  Intimate Partner Violence:   . Fear of Current or Ex-Partner: Not on file  . Emotionally Abused: Not on file  . Physically Abused: Not on file  . Sexually Abused: Not on file    Family History: Family History  Problem Relation Age of Onset  . Hypertension Mother   . Glaucoma Mother   . Atrial fibrillation Mother   . Hypertension Father   . Hypertension Brother   . Hypertension Maternal Grandfather   . Heart attack Maternal Grandfather        MI  . Coronary artery disease Maternal Grandfather   . Hypertension Paternal Grandfather   . Heart attack Paternal Grandfather        MI  . Coronary artery disease Paternal Grandfather   . Hypertension Brother     Review of Systems: All other systems reviewed and are otherwise negative except as noted above.   Physical Exam: VS:  There were no vitals taken for this visit. , BMI There is no height or weight on file to calculate BMI.  GEN- The patient is obese appearing, alert and oriented x 3 today.   HEENT: normocephalic, atraumatic; sclera clear, conjunctiva pink; hearing intact; oropharynx clear; neck supple  Lungs- CTA b/l, normal work of breathing.  No wheezes, rales, rhonchi Heart-   RRR, no significant murmurs, no gallops or rubs GI- soft, non-tender, non-distended, obese Extremities- she has support stockings on, I do not appreciate pitting edema MS- no significant deformity or atrophy Skin- warm and dry, no rash or lesion Psych- euthymic mood, full affect Neuro- strength and sensation are intact  ICD site is stable, no tethering or discomfort    EKG:  Not done today  ICD interrogation: done today and reviewed by myself Battery and lead measurements are good. 99% BP 3 AMS , no EGMs Log reports 3 episodes one episode 3 minutes long though A and V rates  same  07/23/2020: TTE IMPRESSIONS  1. Left ventricular ejection fraction, by estimation, is <20%. The left  ventricle has severely decreased function. The left ventricle demonstrates  regional wall motion abnormalities (see scoring diagram/findings for  description). The left ventricular  internal cavity size was severely dilated. There is mild left ventricular  hypertrophy. Suggestive of impaired filling.  2. Right ventricular systolic function is normal. The right ventricular  size is normal.  3. The mitral valve is normal in structure. No evidence of mitral valve  regurgitation.  4. The aortic valve was not well visualized. Aortic valve regurgitation  is not visualized.   Comparison(s): Stable LV size without significant improvement in LVEF.    12/14/17: TTE Study Conclusions - Left ventricle: Poor acoustic windows limit study even with   Definity use. LVEF is severely depressed. The cavity size was   severely dilated. Wall thickness was normal. Doppler parameters   are consistent with abnormal left ventricular relaxation (grade 1   diastolic dysfunction). - Right ventricle: Systolic function was mildly reduced.  CPX (8/16): Peak VO2 10.1 VE/VCO2 slope 28.4 RER  1.14 OUES 1.3 PFTs mildly restrictive No significant change compared to prior. Limited more by obesity and restrictive lung physiology than heart (mild circulatory limitation).   ECHO 01/02/13 EF 72% Grade 1 diastolic dysfunction. RV ok. No PAH ECHO 07/10/13 EF 20% RV ok ECHO 10/13/14 EF 15-20% RV ok.   10/13/15: Echocardiogram Study Conclusions - Left ventricle: The cavity size was severely dilated. Wall thickness was normal. The estimated ejection fraction was in the range of 10% to 15%. Diffuse hypokinesis. Doppler parameters are consistent with abnormal left ventricular relaxation (grade 1 diastolic dysfunction). Doppler parameters are consistent with high ventricular filling pressure. - Left  atrium: The atrium was moderately dilated.  Recent Labs  Wt Readings from Last 3 Encounters:  07/23/20 278 lb 9.6 oz (126.4 kg)  03/16/20 272 lb (123.4 kg)  01/10/20 275 lb (124.7 kg)     Other studies Reviewed: Additional studies/ records that were reviewed today include: AHF notes, Dr Jackalyn Lombard last office notes  Assessment and Plan:  1.  Chronic systolic dysfunction/NICM      CorVue waxes/wanes      On BB, entresto, , corlanor, lasix and aldactone      Follows closely with AHF team      She has guidelines from Dr. Haroldine Laws for her lasix      99% BP   Has had a number of optimization visits/attempts by last EP note Prior optimization/changes: 02/24/20: echo and EKG opt: LV -> RV by 45 ms, Sensed AV delay 140 ms 02/2012 - AV opt - echo - LV-> RV 40 ms, Paced AV delay 160 ms changed to 140 ms, Sensed AV delay 120 ms 12/2012 - Quick op recommended LV->RV by 30 sec (from 40 sec) 12/2012 - AV opt - echo - LV -> RV 70 ms, Paced AV delay 140 ms to 130 ms  12/2015 - Gen change - LV -> RV simultaneous, Paced AV delay out to 170 ms  She has held her ground it seems symptom-wise, thinks in the last year, and of late especially not felt like she has gotten worse or had escalation of her symptoms No further attempt at optimization was done today  2. ICD     Intact function     No changes made   3.  Paroxysmal atrial fibrillation, one known event in 2012 post-op      h/o one AF episode, not felt warranted a/c      Continue to monitor via her device   4.  HTN      Relative hypotension with her meds      Infrequent orthostatic lightheadedness      Discussed safety  6.  Type B aortic dissection      BP is very well controlled   Current medicines are reviewed at length with the patient today.   The patient does not have concerns regarding her medicines.  The following changes were made today:  none   Disposition:   Continue Q 27mo remote checks, 1 year EP in-clinic, sooner if  needed.      Venetia Night, PA-C 07/24/2020 5:14 AM  [facility] [address] [place]  [phone] (office) [phone] (fax

## 2020-07-31 ENCOUNTER — Telehealth (HOSPITAL_COMMUNITY): Payer: Self-pay | Admitting: Licensed Clinical Social Worker

## 2020-07-31 NOTE — Telephone Encounter (Signed)
CSW informed that pt had questions regarding medicare plans.  Attempted to call after appt last week and left message.  Attempted to call again today but unable to reach.  CSW left VM with contact information for the [facility] program for pt to discuss medicare plans and encouraged her to reach out if she had further questions  Jorge Ny, [facility] Desk#: [phone] Cell#: [phone]

## 2020-08-04 ENCOUNTER — Other Ambulatory Visit: Payer: Self-pay | Admitting: Allergy and Immunology

## 2020-08-04 ENCOUNTER — Other Ambulatory Visit (HOSPITAL_COMMUNITY): Payer: Self-pay | Admitting: Internal Medicine

## 2020-08-24 ENCOUNTER — Ambulatory Visit (INDEPENDENT_AMBULATORY_CARE_PROVIDER_SITE_OTHER): Payer: PRIVATE HEALTH INSURANCE

## 2020-08-24 DIAGNOSIS — I5022 Chronic systolic (congestive) heart failure: Secondary | ICD-10-CM

## 2020-08-24 DIAGNOSIS — Z95810 Presence of automatic (implantable) cardiac defibrillator: Secondary | ICD-10-CM | POA: Diagnosis not present

## 2020-08-26 NOTE — Progress Notes (Signed)
EPIC Encounter for ICM Monitoring  Patient Name: [name] is a 69 y.o. female Date: 08/26/2020 Primary Care Physican: [name], MD Primary Cardiologist:Bensimhon Electrophysiologist: Allred Bi-V Pacing: 99% 7/13/2021Weight:274lbs    Spoke with patient and reports feeling well at this time.  Denies fluid symptoms.    Corvue thoracic impedancesuggesting normal fluid levels.  Prescribed:Furosemide 40 mg 1 tabletthree times a week Monday, Wed and Fridayas needed.Patient takes Furosemide 3 days week routinely.  Labs: 01/10/2020 Creatinine 1.14, BUN 19, Potassium 4.3, Sodium 143, GFR 49  Recommendations: No changes and encouraged to call if experiencing any fluid symptoms.  Follow-up plan: ICM clinic phone appointment on11/29/2021. 91 day device clinic remote transmission12/03/2020.  EP/Cardiology Office Visits:Due follow up visit with Dr Haroldine Laws 3//23/2022 (no recall). Due EP visit 07/26/2021 with EP (no recall).  Copy of ICM check sent to Dr.Allred.  3 month ICM trend: 08/24/2020    1 Year ICM trend:       [name], RN 08/26/2020 12:22 PM

## 2020-09-03 ENCOUNTER — Other Ambulatory Visit (HOSPITAL_COMMUNITY): Payer: Self-pay | Admitting: Internal Medicine

## 2020-09-28 ENCOUNTER — Ambulatory Visit (INDEPENDENT_AMBULATORY_CARE_PROVIDER_SITE_OTHER): Payer: PRIVATE HEALTH INSURANCE

## 2020-09-28 DIAGNOSIS — I5022 Chronic systolic (congestive) heart failure: Secondary | ICD-10-CM

## 2020-09-28 DIAGNOSIS — Z95810 Presence of automatic (implantable) cardiac defibrillator: Secondary | ICD-10-CM

## 2020-09-28 NOTE — Progress Notes (Signed)
EPIC Encounter for ICM Monitoring  Patient Name: [name] is a 69 y.o. female Date: 09/28/2020 Primary Care Physican: [name], MD Primary Cardiologist:Bensimhon Electrophysiologist: Allred Bi-V Pacing: 99% 11/30/2021Weight:274lbs    Spoke with patient and reports she had mouth surgery causing her to drink more liquids and soups which are higher in salt.  Corvue thoracic impedancesuggesting possible fluid accumulation since 09/21/2020.  Prescribed:Furosemide 40 mg 1 tabletthree times a week Monday, Wed and Fridayas needed.Patient takes Furosemide 3 days week routinely.  Labs: 01/10/2020 Creatinine 1.14, BUN 19, Potassium 4.3, Sodium 143, GFR 49  Recommendations: Advised to take Furosemide 40 mg 1 tablet daily x 5 days and then return to 3 days a week.  Follow-up plan: ICM clinic phone appointment on12/05/2020 to recheck fluid levels. 91 day device clinic remote transmission12/03/2020.  EP/Cardiology Office Visits:Due follow up visit with Dr Haroldine Laws 01/20/2021 (no recall). Next EP visit due 07/26/2021 with EP (no recall).  Copy of ICM check sent to Dr.Allred and Dr Haroldine Laws for [facility].   3 month ICM trend: 09/28/2020    1 Year ICM trend:       [name], RN 09/28/2020 4:38 PM

## 2020-10-05 ENCOUNTER — Ambulatory Visit (INDEPENDENT_AMBULATORY_CARE_PROVIDER_SITE_OTHER): Payer: PRIVATE HEALTH INSURANCE

## 2020-10-05 DIAGNOSIS — I428 Other cardiomyopathies: Secondary | ICD-10-CM

## 2020-10-07 ENCOUNTER — Ambulatory Visit (INDEPENDENT_AMBULATORY_CARE_PROVIDER_SITE_OTHER): Payer: PRIVATE HEALTH INSURANCE

## 2020-10-07 DIAGNOSIS — I5022 Chronic systolic (congestive) heart failure: Secondary | ICD-10-CM

## 2020-10-07 DIAGNOSIS — Z95810 Presence of automatic (implantable) cardiac defibrillator: Secondary | ICD-10-CM

## 2020-10-07 LAB — CUP PACEART REMOTE DEVICE CHECK
Battery Remaining Longevity: 23 mo
Battery Remaining Longevity: 23 mo
Battery Remaining Percentage: 35 %
Battery Remaining Percentage: 35 %
Battery Voltage: 2.89 V
Battery Voltage: 2.89 V
Brady Statistic AP VP Percent: 6.1 %
Brady Statistic AP VP Percent: 6.1 %
Brady Statistic AP VS Percent: 1 %
Brady Statistic AP VS Percent: 1 %
Brady Statistic AS VP Percent: 93 %
Brady Statistic AS VP Percent: 93 %
Brady Statistic AS VS Percent: 1 %
Brady Statistic AS VS Percent: 1 %
Brady Statistic RA Percent Paced: 6 %
Brady Statistic RA Percent Paced: 6 %
Date Time Interrogation Session: 20211208020015
Date Time Interrogation Session: 20211208020015
HighPow Impedance: 100 Ohm
HighPow Impedance: 100 Ohm
HighPow Impedance: 100 Ohm
HighPow Impedance: 100 Ohm
Implantable Lead Implant Date: 20120914
Implantable Lead Implant Date: 20120914
Implantable Lead Implant Date: 20121116
Implantable Lead Implant Date: 20120914
Implantable Lead Implant Date: 20120914
Implantable Lead Implant Date: 20121116
Implantable Lead Location: 753858
Implantable Lead Location: 753859
Implantable Lead Location: 753860
Implantable Lead Location: 753858
Implantable Lead Location: 753859
Implantable Lead Location: 753860
Implantable Lead Model: 511212
Implantable Lead Model: 511212
Implantable Lead Serial Number: 194059
Implantable Lead Serial Number: 194059
Implantable Pulse Generator Implant Date: 20170314
Implantable Pulse Generator Implant Date: 20170314
Lead Channel Impedance Value: 280 Ohm
Lead Channel Impedance Value: 440 Ohm
Lead Channel Impedance Value: 440 Ohm
Lead Channel Impedance Value: 280 Ohm
Lead Channel Impedance Value: 440 Ohm
Lead Channel Impedance Value: 440 Ohm
Lead Channel Pacing Threshold Amplitude: 0.875 V
Lead Channel Pacing Threshold Amplitude: 1.25 V
Lead Channel Pacing Threshold Amplitude: 1.75 V
Lead Channel Pacing Threshold Amplitude: 0.875 V
Lead Channel Pacing Threshold Amplitude: 1.25 V
Lead Channel Pacing Threshold Amplitude: 1.75 V
Lead Channel Pacing Threshold Pulse Width: 0.5 ms
Lead Channel Pacing Threshold Pulse Width: 0.5 ms
Lead Channel Pacing Threshold Pulse Width: 0.7 ms
Lead Channel Pacing Threshold Pulse Width: 0.5 ms
Lead Channel Pacing Threshold Pulse Width: 0.5 ms
Lead Channel Pacing Threshold Pulse Width: 0.7 ms
Lead Channel Sensing Intrinsic Amplitude: 0.9 mV
Lead Channel Sensing Intrinsic Amplitude: 11.7 mV
Lead Channel Sensing Intrinsic Amplitude: 0.9 mV
Lead Channel Sensing Intrinsic Amplitude: 11.7 mV
Lead Channel Setting Pacing Amplitude: 1.875
Lead Channel Setting Pacing Amplitude: 2.25 V
Lead Channel Setting Pacing Amplitude: 2.75 V
Lead Channel Setting Pacing Amplitude: 1.875
Lead Channel Setting Pacing Amplitude: 2.25 V
Lead Channel Setting Pacing Amplitude: 2.75 V
Lead Channel Setting Pacing Pulse Width: 0.5 ms
Lead Channel Setting Pacing Pulse Width: 0.7 ms
Lead Channel Setting Pacing Pulse Width: 0.5 ms
Lead Channel Setting Pacing Pulse Width: 0.7 ms
Lead Channel Setting Sensing Sensitivity: 0.5 mV
Lead Channel Setting Sensing Sensitivity: 0.5 mV
Pulse Gen Serial Number: 7328650
Pulse Gen Serial Number: 7328650

## 2020-10-09 NOTE — Progress Notes (Signed)
EPIC Encounter for ICM Monitoring  Patient Name: [name] is a 69 y.o. female Date: 10/09/2020 Primary Care Physican: [name], MD Primary Cardiologist:Bensimhon Electrophysiologist: Allred Bi-V Pacing: 99% 11/30/2021Weight:274lbs 10/09/2020 Weight: 274.4 lbs    Spoke with patient and reports she is feeling fine.    Corvue thoracic impedancesuggesting dryness in response to extra Furosemide.  Prescribed:Furosemide 40 mg 1 tabletthree times a week Monday, Wed and Fridayas needed.Patient takes Furosemide 3 days week routinely.  Labs: 01/10/2020 Creatinine 1.14, BUN 19, Potassium 4.3, Sodium 143, GFR 49  Recommendations: Advised to drink 64-70 oz of fluid today due to slight dryness.  Encouraged to call for changes in condition.  Follow-up plan: ICM clinic phone appointment on1/01/2021. 91 day device clinic remote transmission3/04/2021.  EP/Cardiology Office Visits:Due follow up visit with Dr Haroldine Laws 01/20/2021 (no recall). Next EP visitdue 9/26/2022with EP (no recall).  Copy of ICM check sent to Dr.Allred and Dr Haroldine Laws for [facility].    3 month ICM trend: 10/09/2020    1 Year ICM trend:       [name], RN 10/09/2020 11:32 AM

## 2020-10-09 NOTE — Addendum Note (Signed)
Addended by: [name] on: 10/09/2020 11:55 AM   Modules accepted: Level of Service

## 2020-10-14 NOTE — Progress Notes (Signed)
Remote ICD transmission.   

## 2020-10-16 NOTE — Addendum Note (Signed)
Addended by: Douglass Rivers D on: 10/16/2020 01:49 PM   Modules accepted: Level of Service

## 2020-11-03 ENCOUNTER — Ambulatory Visit (INDEPENDENT_AMBULATORY_CARE_PROVIDER_SITE_OTHER): Payer: PRIVATE HEALTH INSURANCE

## 2020-11-03 DIAGNOSIS — Z95810 Presence of automatic (implantable) cardiac defibrillator: Secondary | ICD-10-CM | POA: Diagnosis not present

## 2020-11-03 DIAGNOSIS — I5022 Chronic systolic (congestive) heart failure: Secondary | ICD-10-CM

## 2020-11-06 NOTE — Progress Notes (Signed)
EPIC Encounter for ICM Monitoring  Patient Name: LAYLIANA DEVINS is a 70 y.o. female Date: 11/06/2020 Primary Care Physican: [name], MD Primary Cardiologist:Bensimhon Electrophysiologist: Allred Bi-V Pacing: 99% 11/06/2020 Weight: 275.2 lbs    Spoke with patient and reportsshe is feeling fine.    Corvue thoracic impedancesuggesting normal fluid levels  Prescribed:Furosemide 40 mg 1 tabletthree times a week Monday, Wed and Fridayas needed.Patient takes Furosemide 3 days week routinely.  Labs: 01/10/2020 Creatinine 1.14, BUN 19, Potassium 4.3, Sodium 143, GFR 49  Recommendations:Encouraged to call for changes in condition.  Follow-up plan: ICM clinic phone appointment on2/05/2021. 91 day device clinic remote transmission3/04/2021.  EP/Cardiology Office Visits:Due follow up visit with Dr Haroldine Laws 01/20/2021 (no recall). NextEP visitdue9/26/2022with EP (no recall).  Copy of ICM check sent to Dr.Allred   3 month ICM trend: 11/06/2020.    1 Year ICM trend:       [name], RN 11/06/2020 2:03 PM

## 2020-11-28 ENCOUNTER — Other Ambulatory Visit: Payer: Self-pay | Admitting: Internal Medicine

## 2020-12-08 ENCOUNTER — Ambulatory Visit (INDEPENDENT_AMBULATORY_CARE_PROVIDER_SITE_OTHER): Payer: PRIVATE HEALTH INSURANCE

## 2020-12-08 DIAGNOSIS — I5022 Chronic systolic (congestive) heart failure: Secondary | ICD-10-CM

## 2020-12-08 DIAGNOSIS — Z95810 Presence of automatic (implantable) cardiac defibrillator: Secondary | ICD-10-CM

## 2020-12-09 NOTE — Progress Notes (Signed)
EPIC Encounter for ICM Monitoring  Patient Name: [name] is a 70 y.o. female Date: 12/09/2020 Primary Care Physican: [name], MD Primary Cardiologist:Bensimhon Electrophysiologist: Allred Bi-V Pacing: 99% 11/06/2020 Weight: 275.2 lbs    Spoke with patient and reports feeling bloated on some days.  She has been eating a lot of deli sandwiches that are high in salt.  Corvue thoracic impedancesuggesting possible fluid accumulation starting 12/06/2020 but starting to trend back toward baseline.  Impedance pattern since 1/14/202 showing below baseline for 2-3 days with return to baseline for a couple of days.  Prescribed:Furosemide 40 mg 1 tabletthree times a week Monday, Wed and Friday.Patient takes Furosemide 3 days week routinely.  Labs: 01/10/2020 Creatinine 1.14, BUN 19, Potassium 4.3, Sodium 143, GFR 49  Recommendations:Advised to take Furosemide Thursday an Friday this week and then resume prescribed dosage.  Advised she may take extra tablet on Tuesday next week if needed.  Encouraged to limit salt intake.  Follow-up plan: ICM clinic phone appointment on2/17/2022 to recheck fluid levels. 91 day device clinic remote transmission3/04/2021.  EP/Cardiology Office Visits:Due follow up visit with Dr Haroldine Laws 01/20/2021 (no recall). NextEP visitdue9/26/2022with EP (no recall).  Copy of ICM check sent to Dr.Allred and Dr Haroldine Laws for [facility].   3 month ICM trend: 12/08/2020.    1 Year ICM trend:       [name], RN 12/09/2020 8:53 AM

## 2020-12-17 ENCOUNTER — Ambulatory Visit (INDEPENDENT_AMBULATORY_CARE_PROVIDER_SITE_OTHER): Payer: PRIVATE HEALTH INSURANCE

## 2020-12-17 DIAGNOSIS — Z95810 Presence of automatic (implantable) cardiac defibrillator: Secondary | ICD-10-CM

## 2020-12-17 DIAGNOSIS — I5022 Chronic systolic (congestive) heart failure: Secondary | ICD-10-CM

## 2020-12-21 ENCOUNTER — Telehealth (HOSPITAL_COMMUNITY): Payer: Self-pay | Admitting: Pharmacist

## 2020-12-21 NOTE — Telephone Encounter (Signed)
Patient Advocate Encounter   Received notification from [employer] that prior authorization for Delene Loll is required.   PA submitted on CoverMyMeds Key [id number] Status is pending   Will continue to follow.   Audry Riles, PharmD, BCPS, BCCP, CPP Heart Failure Clinic Pharmacist [phone]

## 2020-12-21 NOTE — Telephone Encounter (Signed)
Patient Advocate Encounter   Received notification from [employer] that prior authorization for [name] is required.   PA submitted on CoverMyMeds Key [id number] Status is pending   Will continue to follow.   Audry Riles, PharmD, BCPS, BCCP, CPP Heart Failure Clinic Pharmacist [phone]

## 2020-12-21 NOTE — Telephone Encounter (Signed)
Advanced Heart Failure Patient Advocate Encounter  Prior Authorizations for Entresto and [name] have been approved.    Effective dates: 12/21/20 through 12/21/21  Audry Riles, PharmD, BCPS, BCCP, CPP Heart Failure Clinic Pharmacist [phone]

## 2020-12-23 NOTE — Progress Notes (Signed)
EPIC Encounter for ICM Monitoring  Patient Name: [name] is a 70 y.o. female Date: 12/23/2020 Primary Care Physican: [name], MD Primary Cardiologist:Bensimhon Electrophysiologist: Allred Bi-V Pacing: 99% 1/7/2022Weight: 275.2lbs    Transmission reviewed.  Corvue thoracic impedancesuggestingfluid levels returned to normal after taking extra Furosemide.  Prescribed:Furosemide 40 mg 1 tabletthree times a week Monday, Wed and Friday.Patient takes Furosemide 3 days week routinely.  Labs: 01/10/2020 Creatinine 1.14, BUN 19, Potassium 4.3, Sodium 143, GFR 49  Recommendations:No changes.  Follow-up plan: ICM clinic phone appointment on3/28/2022. 91 day device clinic remote transmission3/04/2021.  EP/Cardiology Office Visits:Due follow up visit with Dr Haroldine Laws 01/20/2021 (no recall). NextEP visitdue9/26/2022with EP (no recall).  Copy of ICM check sent to Dr.Allred.   3 month ICM trend: 12/17/2020.    1 Year ICM trend:       [name], RN 12/23/2020 4:47 PM

## 2021-01-01 ENCOUNTER — Other Ambulatory Visit: Payer: Self-pay | Admitting: Allergy and Immunology

## 2021-01-01 ENCOUNTER — Other Ambulatory Visit (HOSPITAL_COMMUNITY): Payer: Self-pay | Admitting: Internal Medicine

## 2021-01-12 ENCOUNTER — Ambulatory Visit (INDEPENDENT_AMBULATORY_CARE_PROVIDER_SITE_OTHER): Payer: PRIVATE HEALTH INSURANCE

## 2021-01-12 DIAGNOSIS — I428 Other cardiomyopathies: Secondary | ICD-10-CM | POA: Diagnosis not present

## 2021-01-13 LAB — CUP PACEART REMOTE DEVICE CHECK
Battery Remaining Longevity: 21 mo
Battery Remaining Percentage: 32 %
Battery Voltage: 2.89 V
Brady Statistic AP VP Percent: 6 %
Brady Statistic AP VS Percent: 1 %
Brady Statistic AS VP Percent: 93 %
Brady Statistic AS VS Percent: 1 %
Brady Statistic RA Percent Paced: 5.9 %
Date Time Interrogation Session: 20220314232904
HighPow Impedance: 97 Ohm
HighPow Impedance: 97 Ohm
Implantable Lead Implant Date: 20120914
Implantable Lead Implant Date: 20120914
Implantable Lead Implant Date: 20121116
Implantable Lead Location: 753858
Implantable Lead Location: 753859
Implantable Lead Location: 753860
Implantable Lead Model: 511212
Implantable Lead Serial Number: 194059
Implantable Pulse Generator Implant Date: 20170314
Lead Channel Impedance Value: 280 Ohm
Lead Channel Impedance Value: 380 Ohm
Lead Channel Impedance Value: 400 Ohm
Lead Channel Pacing Threshold Amplitude: 0.875 V
Lead Channel Pacing Threshold Amplitude: 1.375 V
Lead Channel Pacing Threshold Amplitude: 1.75 V
Lead Channel Pacing Threshold Pulse Width: 0.5 ms
Lead Channel Pacing Threshold Pulse Width: 0.5 ms
Lead Channel Pacing Threshold Pulse Width: 0.7 ms
Lead Channel Sensing Intrinsic Amplitude: 0.8 mV
Lead Channel Sensing Intrinsic Amplitude: 12 mV
Lead Channel Setting Pacing Amplitude: 1.875
Lead Channel Setting Pacing Amplitude: 2.375
Lead Channel Setting Pacing Amplitude: 2.75 V
Lead Channel Setting Pacing Pulse Width: 0.5 ms
Lead Channel Setting Pacing Pulse Width: 0.7 ms
Lead Channel Setting Sensing Sensitivity: 0.5 mV
Pulse Gen Serial Number: 7328650

## 2021-01-20 ENCOUNTER — Other Ambulatory Visit (HOSPITAL_COMMUNITY): Payer: Self-pay | Admitting: *Deleted

## 2021-01-20 DIAGNOSIS — I5022 Chronic systolic (congestive) heart failure: Secondary | ICD-10-CM

## 2021-01-20 NOTE — Progress Notes (Signed)
Remote ICD transmission.   

## 2021-01-25 ENCOUNTER — Ambulatory Visit (INDEPENDENT_AMBULATORY_CARE_PROVIDER_SITE_OTHER): Payer: HMO

## 2021-01-25 DIAGNOSIS — Z95810 Presence of automatic (implantable) cardiac defibrillator: Secondary | ICD-10-CM

## 2021-01-25 DIAGNOSIS — I5022 Chronic systolic (congestive) heart failure: Secondary | ICD-10-CM

## 2021-01-26 NOTE — Progress Notes (Signed)
EPIC Encounter for ICM Monitoring  Patient Name: [name] is a 70 y.o. female Date: 01/26/2021 Primary Care Physican: [name], MD Primary Cardiologist:Bensimhon Electrophysiologist: Allred Bi-V Pacing: 99% 3/29/2022Weight: 275.2lbs    Spoke with patient and reports feeling well at this time.  Denies fluid symptoms.    Corvue thoracic impedancesuggestingfluid levels returned to normal after taking extra Furosemide.  Prescribed:Furosemide 40 mg 1 tabletthree times a week Monday, Wed and Friday.Patient takes Furosemide 3 days week routinely.  Labs: 01/10/2020 Creatinine 1.14, BUN 19, Potassium 4.3, Sodium 143, GFR 49  Recommendations:No changes and encouraged to call if experiencing any fluid symptoms.  Follow-up plan: ICM clinic phone appointment on5/12/2020. 91 day device clinic remote transmission6/15/2022.  EP/Cardiology Office Visits:04/14/2021 with Dr Haroldine Laws NextEP visitdue9/26/2022with EP (no recall).  Copy of ICM check sent to Dr.Allred.   3 month ICM trend: 01/25/2021.    1 Year ICM trend:       [name], RN 01/26/2021 1:10 PM

## 2021-02-01 ENCOUNTER — Other Ambulatory Visit (HOSPITAL_COMMUNITY): Payer: Self-pay | Admitting: Internal Medicine

## 2021-03-01 ENCOUNTER — Ambulatory Visit (INDEPENDENT_AMBULATORY_CARE_PROVIDER_SITE_OTHER): Payer: HMO

## 2021-03-01 DIAGNOSIS — I5022 Chronic systolic (congestive) heart failure: Secondary | ICD-10-CM

## 2021-03-01 DIAGNOSIS — Z95810 Presence of automatic (implantable) cardiac defibrillator: Secondary | ICD-10-CM | POA: Diagnosis not present

## 2021-03-03 NOTE — Progress Notes (Signed)
EPIC Encounter for ICM Monitoring  Patient Name: [name] is a 71 y.o. female Date: 03/03/2021 Primary Care Physican: [name], MD Primary Cardiologist:Bensimhon Electrophysiologist: Allred Bi-V Pacing: 99% 5/4/2022Weight: 275lbs    Spoke with patient and reports feeling well at this time.  Denies fluid symptoms.  No symptoms during decreased impedance and unsure what may have caused fluid accumulation.  Corvue thoracic impedancesuggestingnormal fluid levels.  Prescribed:Furosemide 40 mg 1 tabletthree times a week Monday, Wed and Friday.Patient takes Furosemide 3 days week routinely.  Labs: 01/10/2020 Creatinine 1.14, BUN 19, Potassium 4.3, Sodium 143, GFR 49  Recommendations:  No changes and encouraged to call if experiencing any fluid symptoms.  Follow-up plan: ICM clinic phone appointment on 04/12/2021. 91 day device clinic remote transmission6/15/2022.  EP/Cardiology Office Visits:03/24/2021 with Dr Haroldine Laws NextEP visitdue9/26/2022with EP (no recall).  Copy of ICM check sent to Dr.Allred.   3 month ICM trend: 03/01/2021.    1 Year ICM trend:       [name], RN 03/03/2021 8:03 AM

## 2021-03-08 ENCOUNTER — Other Ambulatory Visit: Payer: Self-pay | Admitting: Allergy and Immunology

## 2021-03-08 ENCOUNTER — Other Ambulatory Visit (HOSPITAL_COMMUNITY): Payer: Self-pay | Admitting: Internal Medicine

## 2021-03-24 ENCOUNTER — Other Ambulatory Visit: Payer: Self-pay

## 2021-03-24 ENCOUNTER — Encounter (HOSPITAL_COMMUNITY): Payer: Self-pay | Admitting: Internal Medicine

## 2021-03-24 ENCOUNTER — Ambulatory Visit (HOSPITAL_COMMUNITY)
Admission: RE | Admit: 2021-03-24 | Discharge: 2021-03-24 | Disposition: A | Discharge status: Home / Self Care | Payer: HMO | Source: Ambulatory Visit | Attending: Internal Medicine | Admitting: Internal Medicine

## 2021-03-24 ENCOUNTER — Ambulatory Visit (HOSPITAL_BASED_OUTPATIENT_CLINIC_OR_DEPARTMENT_OTHER)
Admission: RE | Admit: 2021-03-24 | Discharge: 2021-03-24 | Disposition: A | Discharge status: Home / Self Care | Payer: HMO | Source: Ambulatory Visit | Attending: Internal Medicine | Admitting: Internal Medicine

## 2021-03-24 VITALS — BP 90/60 | HR 88 | Wt 277.6 lb

## 2021-03-24 DIAGNOSIS — Z79899 Other long term (current) drug therapy: Secondary | ICD-10-CM | POA: Diagnosis not present

## 2021-03-24 DIAGNOSIS — Z7901 Long term (current) use of anticoagulants: Secondary | ICD-10-CM | POA: Insufficient documentation

## 2021-03-24 DIAGNOSIS — I428 Other cardiomyopathies: Secondary | ICD-10-CM | POA: Diagnosis not present

## 2021-03-24 DIAGNOSIS — Z95810 Presence of automatic (implantable) cardiac defibrillator: Secondary | ICD-10-CM | POA: Diagnosis not present

## 2021-03-24 DIAGNOSIS — I480 Paroxysmal atrial fibrillation: Secondary | ICD-10-CM | POA: Diagnosis not present

## 2021-03-24 DIAGNOSIS — M1990 Unspecified osteoarthritis, unspecified site: Secondary | ICD-10-CM | POA: Insufficient documentation

## 2021-03-24 DIAGNOSIS — Z7982 Long term (current) use of aspirin: Secondary | ICD-10-CM | POA: Insufficient documentation

## 2021-03-24 DIAGNOSIS — I5022 Chronic systolic (congestive) heart failure: Secondary | ICD-10-CM

## 2021-03-24 DIAGNOSIS — R42 Dizziness and giddiness: Secondary | ICD-10-CM | POA: Diagnosis present

## 2021-03-24 DIAGNOSIS — I110 Hypertensive heart disease with heart failure: Secondary | ICD-10-CM | POA: Diagnosis not present

## 2021-03-24 MED ORDER — IVABRADINE HCL 5 MG PO TABS: 5.0000 mg | ORAL_TABLET | Freq: Two times a day (BID) | ORAL | 11 refills | Status: DC

## 2021-03-24 MED ORDER — PERFLUTREN LIPID MICROSPHERE
1.0000 mL | INTRAVENOUS | Status: DC | PRN
  Administered 2021-03-24: 2 mL via INTRAVENOUS
  Filled 2021-03-24: qty 10

## 2021-03-24 NOTE — Progress Notes (Signed)
  Echocardiogram 2D Echocardiogram with definity has been performed.  Darlina Sicilian M 03/24/2021, 11:00 AM

## 2021-03-24 NOTE — Progress Notes (Signed)
ADVANCED HF CLINIC NOTE   Patient ID: KALAYLA SHADDEN, female   DOB: [birth year], 70 y.o.   MRN: [id number] Oncologist: Dr. Philipp Ovens at [facility] EP: Dr. [name] Dr. Bea Graff in [place] HF: Lizzie An  HPI: Ms. Bohanon is a 70 y.o. woman with a history of HTN, Type B aortic dissection (2010) managed with medical therapy by Dr. Prescott Gum and has healed, LBBB, systolic heart failure due to NICM with EF 10-15% Cath Dec 2011 showed normal arteries.    She was diagnosed with breast cancer in 2009 s/p lumpectomy, XRT, and chemotherapy.  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   She underwent St Jude Bi-V ICD implantation Sept 2012 but unable to place LV lead.  She eventually brought back in Nov 2012 for left thoracotomy and placement of epicardial LV lead placement by Dr. Prescott Gum.  She also had a sleep study that showed mild OSA.  No clinical intervention occurred.    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 Started PPI and famotidine. Question also raised about possible trial off ARNI.   She presents today for regular follow up. Previously corlanor cut back to 2.5 mg BID with occasional bradycardia. Here with her husband. Overall relatively stable NYHA II-III but yesterday felt lightheaded for several hours. No palpitations or ICD firings. SBP 95.    ICD interrogation: No VT/AF. Volume low. 93% VP. Personally reviewed   Echo today 03/24/21: EF 20-25% RV ok Personally reviewed  Echo 07/23/20 EF 20-25% with dyssynchrony RV ok   Echo 01/10/20: LVEF 10% with severe dyssynchrony. RV ok    Echo 12/2017 LVEF 20%, RV ok.    10/10/2012 CPX Peak VO2: 10.6 % predicted peak VO2: 64.4% VE/VCO2 slope: 26.9 OUES: 1.18 Peak RER: 1.29 Ventilatory Threshold: 7.3 % predicted peak VO2: 44.4% Peak RR 23 Peak Ventilation: 29.8 VE/MVV: 32.6% PETCO2 at peak: 43  01/2014 CPX Peak VO2:  10.3 (66.4% predicted peak VO2) VE/VCO2 slope: 25.0 OUES: 1.30 Peak RER: 1.20  CPX (8/16): Peak VO2 10.1 VE/VCO2 slope 28.4 RER 1.14 OUES 1.3 PFTs mildly restrictive No significant change compared to prior.  Limited more by obesity and restrictive lung physiology than heart (mild circulatory limitation).   ECHO 01/02/13 EF 40% Grade 1 diastolic dysfunction. RV ok. No PAH ECHO 07/10/13 EF 20% RV ok ECHO 10/13/14 EF 15-20% RV ok Echo 10/2015 EF 15%  Echo 09/30/16 LVEF 10-15%, Grade 1 DD, mild PI, Mild TR. Normal RV.  Review of systems complete and found to be negative unless listed in HPI.      Past Medical History:  Diagnosis Date  . Aortic dissection ([place]) 12/2008   Type B  . Arthritis    lower back  . Atrial fibrillation ([place])    post op (left thoracotomy) 11/12 req amio/DCCV  . Breast cancer ([place]) 07/2009   Stage 3 lumpectomy, radiation, chemotherapy; finished chemo October, felt to be in remission  . Cardiac defibrillator in place    St. Jude (JA) 9/12; LV lead placement unsuccessful;  s/p left thoracotomy with placement of epicardial LV lead 09/2011 (Dr. PVT)  . CHF (congestive heart failure) ([place])   . Chronic systolic dysfunction of left ventricle   . Depression   . DVT (deep venous thrombosis) ([place]) 10/18/2010  . GERD (gastroesophageal reflux disease)   . Glaucoma   . Hiatal hernia    small hiatal hernia  . History of  shingles   . Hyperlipidemia   . Hypertension   . Left bundle branch block   . Nonischemic cardiomyopathy ([place])    Admission 12/11 with EF 25%, normal coronary arteries by cath 12/11; NICM presumed to be secondary to chemotherapy for breast cancer  . Obesity   . [name]    with exertion   Current Outpatient Medications  Medication Sig Dispense Refill  . alendronate (FOSAMAX) 70 MG tablet Take 70 mg by mouth once a week. Take with a full glass of water on an empty stomach. Takes on Monday    . ALPRAZolam (XANAX) 1 MG tablet Take 0.5 mg  by mouth daily as needed for anxiety. Anxiety     . aspirin 81 MG EC tablet Take 81 mg by mouth daily.    [name] azelastine (OPTIVAR) 0.05 % ophthalmic solution Place 1 drop into both eyes daily.    . benzonatate (TESSALON) 200 MG capsule Take 200 mg by mouth 3 (three) times daily as needed.    . Bepotastine Besilate 1.5 % SOLN Place 1 drop into both eyes daily as needed. Dry eyes     . bimatoprost (LUMIGAN) 0.03 % ophthalmic solution Place 1 drop into both eyes at bedtime. Reported on 01/12/2016    . Calcium Carbonate-Vitamin D (CALCIUM-VITAMIN D3) 600-200 MG-UNIT TABS Take 1 tablet by mouth 2 (two) times daily.    . carvedilol (COREG) 12.5 MG tablet TAKE ONE TABLET BY MOUTH TWICE DAILY 180 tablet 3  . cetirizine (ZYRTEC) 10 MG tablet Take 1 tablet by mouth daily.    . cholecalciferol (VITAMIN D) 1000 UNITS tablet Take 1,000 Units by mouth 2 (two) times daily.    [name] COLCRYS 0.6 MG tablet TAKE ONE TABLET BY MOUTH EVERY DAY 30 tablet 1  . [name] 5 MG TABS tablet TAKE 1/2 TABLET BY MOUTH TWICE DAILY 60 tablet 0  . cyclobenzaprine (FLEXERIL) 10 MG tablet Take 1 tablet (10 mg total) by mouth 3 (three) times daily as needed for muscle spasms. 30 tablet 0  . diclofenac sodium (VOLTAREN) 1 % GEL APPLY THREE GRAMS TO THREE LARGE JOINTS UP TO THREE TIMES DAILY AS NEEDED 3 Tube 3  . ENTRESTO 49-51 MG TAKE ONE TABLET BY MOUTH TWICE DAILY 180 tablet 3  . furosemide (LASIX) 40 MG tablet Take 1 tablet (40 mg total) by mouth 3 (three) times a week. Monday Wednesday Friday 90 tablet 1  . HYDROcodone-acetaminophen (NORCO/VICODIN) 5-325 MG tablet TAKE ONE TABLET BY MOUTH EVERY 6 HOURS PRN    . levothyroxine (SYNTHROID) 25 MCG tablet Take 25 mcg by mouth daily.    . montelukast (SINGULAIR) 10 MG tablet Take 10 mg by mouth at bedtime. For allergies & congestion    . Multiple Vitamin (MULTIVITAMIN) tablet Take 1 tablet by mouth daily.    [name] 40 MG capsule TAKE ONE CAPSULE BY MOUTH TWICE DAILY 60  capsule 8  . PARoxetine (PAXIL) 20 MG tablet Take 20 mg by mouth daily.  1  . Polyethyl Glycol-Propyl Glycol (SYSTANE OP) Apply 1 drop to eye daily as needed (dry eyes).     . rosuvastatin (CRESTOR) 5 MG tablet TAKE ONE TABLET BY MOUTH EVERY EVENING 90 tablet 3  . spironolactone (ALDACTONE) 25 MG tablet TAKE ONE TABLET BY MOUTH daily 90 tablet 3  . traMADol (ULTRAM) 50 MG tablet Take 50 mg by mouth every 6 (six) hours as needed for pain.     No current facility-administered medications for this encounter.  Facility-Administered Medications Ordered in Other Encounters  Medication Dose Route Frequency Provider Last Rate Last Admin  . perflutren lipid microspheres (DEFINITY) IV suspension  1-10 mL Intravenous PRN Kaeya Schiffer, Shaune Pascal, MD   2 mL at 03/24/21 1058   PHYSICAL EXAM: Vitals:   03/24/21 1112  BP: 90/60  Pulse: 88  SpO2: 94%  Weight: 125.9 kg (277 lb 9.6 oz)   Wt Readings from Last 3 Encounters:  03/24/21 125.9 kg (277 lb 9.6 oz)  07/24/20 125.6 kg (277 lb)  07/23/20 126.4 kg (278 lb 9.6 oz)   Physical Exam  General:  Well appearing. No resp difficulty HEENT: normal Neck: supple. Hard to see JVD. Carotids 2+ bilat; no bruits. No lymphadenopathy or thryomegaly appreciated. Cor: PMI nondisplaced. Regular rate & rhythm. No rubs, gallops or murmurs. Lungs: clear Abdomen: obese soft, nontender, nondistended. No hepatosplenomegaly. No bruits or masses. Good bowel sounds. Extremities: no cyanosis, clubbing, rash, edema + support stockings Neuro: alert & orientedx3, cranial nerves grossly intact. moves all 4 extremities w/o difficulty. Affect pleasant    ASSESSMENT & PLAN:  1. Chronic systolic CHF: Nonischemic cardiomyopathy.   - Echo 09/30/16 LVEF 10-15% s/p St Jude Bi-V ICD with epicardial LV lead.  - Echo 12/2017 EF 20% stable. - CPX in 8/16 was similar to the past: it appeared that the major limitation was from body habitus/deconditioning and not cardiac - Echo 01/10/20  LVEF 10-15% severe dyssynchrony, RV ok  - Underwent AV optimization of CRT in 4/21 QRS 170 -> 152ms - Echo 07/23/20 EF 20-25% with dyssynchrony RV ok  - ICD interrogation: No VT/AF. Volume low. 93% VP. Personally reviewed - Echo today 03/24/21: EF 20-25% RV ok Personally reviewed - Stable NYHA III symptoms, confounded by severe arthritis.  - Volume status low on ICD today - Continue Entresto 49/51 mg BID. Unable to titrate due to low BP - Continue lasix 40 mg M/W/F. Can take extra as needed.  - Continue coreg 12.5 mg BID.  - Continue spironolactone 25 daily.  - Increase corlanor to 5  bid - We discussed SGLT2i in past but concern for UTIs so will avoid - Likely would not qualify for advanced therapies with her comorbidities.  - Will screen for Anthem or Batwire today - BMET next week with PCP  2. Paroxysmal atrial fibrillation:  - This was post-op.  - In NSR on ECG today. No AF on device - Continue ASA 81 mg daily.  - If recurs, will need antiocoag with CHA2DS2/VAS of 4.  3. Type B aortic dissection:  - Stable. BP stable.  4. HTN  - BP runs low. Remains asymptomatic  5. Arthritis - Knees and Hips. Not candidate for surgery with severe LV dysfunction.  - No change 6. HL  - Continue crestor   Glori Bickers, MD 03/24/2021

## 2021-03-24 NOTE — Patient Instructions (Signed)
EKG done today.  No Labs done today.   INCREASE Colanor to 5mg  (1 tablet) by mouth 2 times daily.   No other medication changes were made. Please continue all current medications as prescribed.  Your physician recommends that you schedule a follow-up appointment in: 6 months with our [facility] here in our office. Please contact our office in October to schedule a November appointment.   If you have any questions or concerns before your next appointment please send [place] a message through [place] or call our office at [phone].    TO LEAVE A MESSAGE FOR THE NURSE SELECT OPTION 2, PLEASE LEAVE A MESSAGE INCLUDING: . YOUR NAME . DATE OF BIRTH . CALL BACK NUMBER . REASON FOR CALL**this is important as we prioritize the call backs  YOU WILL RECEIVE A CALL BACK THE SAME DAY AS LONG AS YOU CALL BEFORE 4:00 PM   Do the following things EVERYDAY: 1) Weigh yourself in the morning before breakfast. Write it down and keep it in a log. 2) Take your medicines as prescribed 3) Eat low salt foods--Limit salt (sodium) to 2000 mg per day.  4) Stay as active as you can everyday 5) Limit all fluids for the day to less than 2 liters   At the [facility], you and your health needs are our priority. As part of our continuing mission to provide you with exceptional heart care, we have created designated Provider Care Teams. These Care Teams include your primary Cardiologist (physician) and Advanced Practice Providers (APPs- Physician Assistants and Nurse Practitioners) who all work together to provide you with the care you need, when you need it.   You may see any of the following providers on your designated Care Team at your next follow up: [name] Dr Glori Bickers . Dr Loralie Champagne . Darrick Grinder, NP . Lyda Jester, PA . Audry Riles, PharmD   Please be sure to bring in all your medications bottles to every appointment.

## 2021-03-24 NOTE — Addendum Note (Signed)
Encounter addended by: Shonna Chock, CMA on: 2/63/3354 12:56 PM  Actions taken: Order list changed, Clinical Note Signed, Follow-up modified

## 2021-04-12 ENCOUNTER — Ambulatory Visit (INDEPENDENT_AMBULATORY_CARE_PROVIDER_SITE_OTHER): Payer: HMO

## 2021-04-12 DIAGNOSIS — Z95810 Presence of automatic (implantable) cardiac defibrillator: Secondary | ICD-10-CM | POA: Diagnosis not present

## 2021-04-12 DIAGNOSIS — I5022 Chronic systolic (congestive) heart failure: Secondary | ICD-10-CM

## 2021-04-13 ENCOUNTER — Telehealth: Payer: Self-pay

## 2021-04-13 NOTE — Progress Notes (Signed)
EPIC Encounter for ICM Monitoring  Patient Name: [name] is a 70 y.o. female Date: 04/13/2021 Primary Care Physican: [name], MD Primary Cardiologist: [place] Electrophysiologist: Allred Bi-V Pacing: 99%           03/03/2021 Weight: 275 lbs          Attempted call to patient and unable to reach.  Left detailed message per DPR regarding transmission. Transmission reviewed.    Corvue thoracic impedance suggesting normal fluid levels.   Prescribed: Furosemide 40 mg 1 tablet three times a week Monday, Wed and Friday.   Patient takes Furosemide 3 days week routinely.    Labs: 01/10/2020 Creatinine 1.14, BUN 19, Potassium 4.3, Sodium 143, GFR 49   Recommendations: Left voice mail with ICM number and encouraged to call if experiencing any fluid symptoms.   Follow-up plan: ICM clinic phone appointment on 05/17/2021/2022.  91 day device clinic remote transmission 04/14/2021.    EP/Cardiology Office Visits:  Next EP visit due 07/26/2021 with EP (no recall).   Copy of ICM check sent to Dr. [name].   3 month ICM trend: 04/12/2021.    1 Year ICM trend:       [name], RN 04/13/2021 2:58 PM

## 2021-04-13 NOTE — Telephone Encounter (Signed)
Remote ICM transmission received.  Attempted call to patient regarding ICM remote transmission and left detailed message per DPR.  Advised to return call for any fluid symptoms or questions. Next ICM remote transmission scheduled 05/17/2021.

## 2021-04-14 ENCOUNTER — Ambulatory Visit (INDEPENDENT_AMBULATORY_CARE_PROVIDER_SITE_OTHER): Payer: HMO

## 2021-04-14 DIAGNOSIS — I428 Other cardiomyopathies: Secondary | ICD-10-CM | POA: Diagnosis not present

## 2021-04-15 LAB — CUP PACEART REMOTE DEVICE CHECK
Battery Remaining Longevity: 18 mo
Battery Remaining Percentage: 29 %
Battery Voltage: 2.87 V
Brady Statistic AP VP Percent: 5.8 %
Brady Statistic AP VS Percent: 1 %
Brady Statistic AS VP Percent: 93 %
Brady Statistic AS VS Percent: 1 %
Brady Statistic RA Percent Paced: 5.6 %
Date Time Interrogation Session: 20220613020017
HighPow Impedance: 89 Ohm
HighPow Impedance: 89 Ohm
Implantable Lead Implant Date: 20120914
Implantable Lead Implant Date: 20120914
Implantable Lead Implant Date: 20121116
Implantable Lead Location: 753858
Implantable Lead Location: 753859
Implantable Lead Location: 753860
Implantable Lead Model: 511212
Implantable Lead Serial Number: 194059
Implantable Pulse Generator Implant Date: 20170314
Lead Channel Impedance Value: 260 Ohm
Lead Channel Impedance Value: 390 Ohm
Lead Channel Impedance Value: 410 Ohm
Lead Channel Pacing Threshold Amplitude: 0.75 V
Lead Channel Pacing Threshold Amplitude: 1.125 V
Lead Channel Pacing Threshold Amplitude: 1.75 V
Lead Channel Pacing Threshold Pulse Width: 0.5 ms
Lead Channel Pacing Threshold Pulse Width: 0.5 ms
Lead Channel Pacing Threshold Pulse Width: 0.7 ms
Lead Channel Sensing Intrinsic Amplitude: 0.5 mV
Lead Channel Sensing Intrinsic Amplitude: 11.7 mV
Lead Channel Setting Pacing Amplitude: 1.75 V
Lead Channel Setting Pacing Amplitude: 2.125
Lead Channel Setting Pacing Amplitude: 2.75 V
Lead Channel Setting Pacing Pulse Width: 0.5 ms
Lead Channel Setting Pacing Pulse Width: 0.7 ms
Lead Channel Setting Sensing Sensitivity: 0.5 mV
Pulse Gen Serial Number: 7328650

## 2021-04-18 ENCOUNTER — Other Ambulatory Visit: Payer: Self-pay | Admitting: Allergy and Immunology

## 2021-05-05 NOTE — Progress Notes (Signed)
Remote ICD transmission.   

## 2021-05-07 ENCOUNTER — Other Ambulatory Visit: Payer: Self-pay | Admitting: Allergy and Immunology

## 2021-05-14 ENCOUNTER — Telehealth (HOSPITAL_COMMUNITY): Payer: Self-pay | Admitting: Pharmacy Technician

## 2021-05-14 NOTE — Telephone Encounter (Addendum)
Advanced Heart Failure Patient Advocate Encounter  Patient left a message stating that when she changed from 2.5mg  bid to 5mg  bid of [name] it increased her co-pay.   She expressed concern more so about her pulse. Stated that her pulse typically runs about 68 and not above 70. She was afraid that it would drop her pulse too low if she takes a whole 5mg  tablet.   Sent message to provider to advise.   Will follow up with patient.

## 2021-05-17 ENCOUNTER — Ambulatory Visit (INDEPENDENT_AMBULATORY_CARE_PROVIDER_SITE_OTHER): Payer: HMO

## 2021-05-17 DIAGNOSIS — Z95810 Presence of automatic (implantable) cardiac defibrillator: Secondary | ICD-10-CM

## 2021-05-17 DIAGNOSIS — I5022 Chronic systolic (congestive) heart failure: Secondary | ICD-10-CM

## 2021-05-17 NOTE — Telephone Encounter (Signed)
Advanced Heart Failure Patient Advocate Encounter  Confirmed with provider that patient can take [name] 2.5 mg bid. Goal HR 50-70.   Called and left patient above message. Advised patient to call back with any issues.   Charlann Boxer, CPhT

## 2021-05-21 NOTE — Progress Notes (Signed)
EPIC Encounter for ICM Monitoring  Patient Name: [name] is a 70 y.o. female Date: 05/21/2021 Primary Care Physican: [name], MD Primary Cardiologist: [place] Electrophysiologist: Allred Bi-V Pacing: 99%           05/21/2021 Weight: 277 lbs          Spoke with patient and heart failure questions reviewed.  Pt reports low BP and nausea but she has episodes like this before.  She has also discussed with Dr Haroldine Laws.   Corvue thoracic impedance suggesting normal fluid levels.   Prescribed: Furosemide 40 mg 1 tablet three times a week Monday, Wed and Friday.   Patient takes Furosemide 3 days week routinely.    Labs: 01/10/2020 Creatinine 1.14, BUN 19, Potassium 4.3, Sodium 143, GFR 49   Recommendations: No changes and encouraged to call if experiencing any fluid symptoms.   Follow-up plan: ICM clinic phone appointment on 06/21/2021.  91 day device clinic remote transmission 07/14/2021.    EP/Cardiology Office Visits:  Next EP visit due 07/26/2021 with EP (no recall).   Copy of ICM check sent to Dr. [name].   3 month ICM trend: 05/17/2021.    1 Year ICM trend:       [name], RN 05/21/2021 2:13 PM

## 2021-06-10 ENCOUNTER — Other Ambulatory Visit (HOSPITAL_COMMUNITY): Payer: Self-pay

## 2021-06-10 MED ORDER — IVABRADINE HCL 5 MG PO TABS: 2.5000 mg | ORAL_TABLET | Freq: Two times a day (BID) | ORAL | 3 refills | Status: DC

## 2021-06-21 ENCOUNTER — Ambulatory Visit (INDEPENDENT_AMBULATORY_CARE_PROVIDER_SITE_OTHER): Payer: HMO

## 2021-06-21 DIAGNOSIS — Z95810 Presence of automatic (implantable) cardiac defibrillator: Secondary | ICD-10-CM | POA: Diagnosis not present

## 2021-06-21 DIAGNOSIS — I5022 Chronic systolic (congestive) heart failure: Secondary | ICD-10-CM | POA: Diagnosis not present

## 2021-06-22 NOTE — Progress Notes (Signed)
EPIC Encounter for ICM Monitoring  Patient Name: [name] is a 70 y.o. female Date: 06/22/2021 Primary Care Physican: [name], MD Primary Cardiologist: [place] Electrophysiologist: Allred Bi-V Pacing: >99%           05/21/2021 Weight: 277 lbs          Attempted call to patient and unable to reach.  Transmission reviewed.    Corvue thoracic impedance suggesting normal fluid levels.   Prescribed: Furosemide 40 mg 1 tablet three times a week Monday, Wed and Friday.   Patient takes Furosemide 3 days week routinely.    Labs: 01/10/2020 Creatinine 1.14, BUN 19, Potassium 4.3, Sodium 143, GFR 49   Recommendations: Unable to reach.     Follow-up plan: ICM clinic phone appointment on 08/02/2021.  91 day device clinic remote transmission 07/14/2021.    EP/Cardiology Office Visits:  Next EP visit due 07/26/2021 (no recall).   Copy of ICM check sent to Dr. [name].    3 month ICM trend: 06/21/2021.    1 Year ICM trend:       [name], RN 06/22/2021 4:34 PM

## 2021-07-08 ENCOUNTER — Other Ambulatory Visit (HOSPITAL_COMMUNITY): Payer: Self-pay

## 2021-07-08 ENCOUNTER — Telehealth (HOSPITAL_COMMUNITY): Payer: Self-pay | Admitting: Pharmacy Technician

## 2021-07-08 NOTE — Telephone Encounter (Signed)
Advanced Heart Failure Patient Advocate Encounter  Received a message that the patient was requesting samples of Entresto and [name], as she was in the donut hole. The PAN HF grant is currently open. Called and left the patient a voicemail. Cannot say how long the grant will remain open. Will try the patient again.

## 2021-07-08 NOTE — Telephone Encounter (Signed)
Advanced Heart Failure Patient Advocate Encounter  Spoke with the patient. Was able to obtain a PAN HF grant to help cover the cost of Entresto and [name]. Emailed the grant billing information to the patient and also to the patient's pharmacy.   Member ID: [id number] Group ID: [id number] RxBin ID: [id number] PCN: [id number] Eligibility Start Date: 04/09/2021 Eligibility End Date: 07/07/2022 Assistance Amount: $1,000.00  [place]

## 2021-07-14 ENCOUNTER — Ambulatory Visit (INDEPENDENT_AMBULATORY_CARE_PROVIDER_SITE_OTHER): Payer: HMO

## 2021-07-14 DIAGNOSIS — I428 Other cardiomyopathies: Secondary | ICD-10-CM

## 2021-07-14 LAB — CUP PACEART REMOTE DEVICE CHECK
Battery Remaining Longevity: 17 mo
Battery Remaining Percentage: 28 %
Battery Voltage: 2.86 V
Brady Statistic AP VP Percent: 5.9 %
Brady Statistic AP VS Percent: 1 %
Brady Statistic AS VP Percent: 93 %
Brady Statistic AS VS Percent: 1 %
Brady Statistic RA Percent Paced: 5.8 %
Date Time Interrogation Session: 20220914020021
HighPow Impedance: 86 Ohm
HighPow Impedance: 86 Ohm
Implantable Lead Implant Date: 20120914
Implantable Lead Implant Date: 20120914
Implantable Lead Implant Date: 20121116
Implantable Lead Location: 753858
Implantable Lead Location: 753859
Implantable Lead Location: 753860
Implantable Lead Model: 511212
Implantable Lead Serial Number: 194059
Implantable Pulse Generator Implant Date: 20170314
Lead Channel Impedance Value: 260 Ohm
Lead Channel Impedance Value: 390 Ohm
Lead Channel Impedance Value: 400 Ohm
Lead Channel Pacing Threshold Amplitude: 0.875 V
Lead Channel Pacing Threshold Amplitude: 1.5 V
Lead Channel Pacing Threshold Amplitude: 1.75 V
Lead Channel Pacing Threshold Pulse Width: 0.5 ms
Lead Channel Pacing Threshold Pulse Width: 0.5 ms
Lead Channel Pacing Threshold Pulse Width: 0.7 ms
Lead Channel Sensing Intrinsic Amplitude: 0.5 mV
Lead Channel Sensing Intrinsic Amplitude: 12 mV
Lead Channel Setting Pacing Amplitude: 1.875
Lead Channel Setting Pacing Amplitude: 2.5 V
Lead Channel Setting Pacing Amplitude: 2.75 V
Lead Channel Setting Pacing Pulse Width: 0.5 ms
Lead Channel Setting Pacing Pulse Width: 0.7 ms
Lead Channel Setting Sensing Sensitivity: 0.5 mV
Pulse Gen Serial Number: 7328650

## 2021-07-21 NOTE — Progress Notes (Signed)
Remote ICD transmission.   

## 2021-08-02 ENCOUNTER — Ambulatory Visit (INDEPENDENT_AMBULATORY_CARE_PROVIDER_SITE_OTHER): Payer: HMO

## 2021-08-02 DIAGNOSIS — I5022 Chronic systolic (congestive) heart failure: Secondary | ICD-10-CM | POA: Diagnosis not present

## 2021-08-02 DIAGNOSIS — Z95810 Presence of automatic (implantable) cardiac defibrillator: Secondary | ICD-10-CM | POA: Diagnosis not present

## 2021-08-02 NOTE — Progress Notes (Signed)
EPIC Encounter for ICM Monitoring  Patient Name: [name] is a 70 y.o. female Date: 08/02/2021 Primary Care Physican: [name], MD Primary Cardiologist: [place] Electrophysiologist: Allred Bi-V Pacing: >99%           05/21/2021 Weight: 277 lbs (not weighing)          Spoke with patient and heart failure questions reviewed.  Pt reporting ankle swelling but is resolving.  She reports it is possible diet and fluid intake may be contributing to decreased impedance.  [place] and [place] be returning this week again.    Corvue thoracic impedance suggesting possible fluid accumulation starting 9/26 through 10/3 with one day at baseline.   Prescribed: Furosemide 40 mg 1 tablet three times a week Monday, Wed and Friday.   Patient takes Furosemide 3 days week routinely.    Labs: 01/10/2020 Creatinine 1.14, BUN 19, Potassium 4.3, Sodium 143, GFR 49   Recommendations:  Advsied to take 1 extra Furosemide daily x 2 days then return to 3 times a week.  Encouraged to limit salt and fluid intake.    Follow-up plan: ICM clinic phone appointment on 08/10/2021 to recheck fluid levels.  91 day device clinic remote transmission 07/14/2021.    EP/Cardiology Office Visits:  Next EP visit due 07/26/2021 (no recall).  Recall HF clinic 09/21/2021.   Copy of ICM check sent to Dr. [name].   3 month ICM trend: 08/02/2021.    1 Year ICM trend:       [name], RN 08/02/2021 2:06 PM

## 2021-08-06 ENCOUNTER — Other Ambulatory Visit (HOSPITAL_COMMUNITY): Payer: Self-pay | Admitting: Internal Medicine

## 2021-08-10 ENCOUNTER — Ambulatory Visit: Payer: HMO

## 2021-08-10 DIAGNOSIS — I5022 Chronic systolic (congestive) heart failure: Secondary | ICD-10-CM

## 2021-08-10 DIAGNOSIS — Z95810 Presence of automatic (implantable) cardiac defibrillator: Secondary | ICD-10-CM

## 2021-08-11 ENCOUNTER — Telehealth: Payer: Self-pay

## 2021-08-11 NOTE — Telephone Encounter (Signed)
Remote ICM transmission received.  Attempted call to patient regarding ICM remote transmission and left detailed message per DPR.  Advised to return call for any fluid symptoms or questions. Next ICM remote transmission scheduled 09/06/2021.

## 2021-08-11 NOTE — Progress Notes (Signed)
EPIC Encounter for ICM Monitoring  Patient Name: [name] is a 70 y.o. female Date: 08/11/2021 Primary Care Physican: [name], MD Primary Cardiologist: [place] Electrophysiologist: Allred Bi-V Pacing: >99%           05/21/2021 Weight: 277 lbs (not weighing)          Attempted call to patient and unable to reach.  Left detailed message per DPR regarding transmission. Transmission reviewed.    Corvue thoracic impedance suggesting fluid levels returned to normal after taking extra Furosemide.   Prescribed: Furosemide 40 mg 1 tablet three times a week Monday, Wed and Friday.   Patient takes Furosemide 3 days week routinely.    Labs: 01/10/2020 Creatinine 1.14, BUN 19, Potassium 4.3, Sodium 143, GFR 49   Recommendations:  Left voice mail with ICM number and encouraged to call if experiencing any fluid symptoms.   Follow-up plan: ICM clinic phone appointment on 09/06/2021.  91 day device clinic remote transmission 10/13/2021.    EP/Cardiology Office Visits:  Next EP visit due 07/26/2021 (no recall).  Recall HF clinic 09/21/2021.   Copy of ICM check sent to Dr. [name].    3 month ICM trend: 08/10/2021.    1 Year ICM trend:       [name], RN 08/11/2021 12:25 PM

## 2021-08-23 ENCOUNTER — Other Ambulatory Visit (HOSPITAL_COMMUNITY): Payer: Self-pay | Admitting: Internal Medicine

## 2021-09-06 ENCOUNTER — Ambulatory Visit (INDEPENDENT_AMBULATORY_CARE_PROVIDER_SITE_OTHER): Payer: HMO

## 2021-09-06 DIAGNOSIS — I5022 Chronic systolic (congestive) heart failure: Secondary | ICD-10-CM | POA: Diagnosis not present

## 2021-09-06 DIAGNOSIS — Z95810 Presence of automatic (implantable) cardiac defibrillator: Secondary | ICD-10-CM | POA: Diagnosis not present

## 2021-09-06 NOTE — Progress Notes (Signed)
EPIC Encounter for ICM Monitoring  Patient Name: [name] is a 70 y.o. female Date: 09/06/2021 Primary Care Physican: [name], MD Primary Cardiologist: [place] Electrophysiologist: Allred Bi-V Pacing: >99%           09/06/2021 Weight: 280 lbs           Spoke with patient and heart failure questions reviewed.  Pt reporting swelling of the feet.  Weight gain of 3 lbs in the past 2 weeks.   Corvue thoracic impedance suggesting possible fluid accumulation since 11/4 and starting to trend back to baseline.     Prescribed: Furosemide 40 mg 1 tablet three times a week Monday, Wed and Friday.      Labs: 03/31/2021 Creatinine 1.11, BUN 19, Potassium 4.2, Sodium 141, GFR 54 A complete set of results can be found in Results Review.  Recommendations:  Advised to take extra 20 mg Furosemide today and Wednesday.  Encouraged to call if symptoms become worse.    Follow-up plan: ICM clinic phone appointment on 09/14/2021 to recheck fluid levels.  91 day device clinic remote transmission 10/13/2021.    EP/Cardiology Office Visits:  Next EP visit due 07/26/2021 (no recall).  Recall HF clinic 09/21/2021.   Copy of ICM check sent to Dr. [name] and Dr Haroldine Laws.     3 month ICM trend: 09/06/2021.    1 Year ICM trend:       [name], RN 09/06/2021 10:53 AM

## 2021-09-14 ENCOUNTER — Ambulatory Visit (INDEPENDENT_AMBULATORY_CARE_PROVIDER_SITE_OTHER): Payer: HMO

## 2021-09-14 DIAGNOSIS — I5022 Chronic systolic (congestive) heart failure: Secondary | ICD-10-CM

## 2021-09-14 DIAGNOSIS — Z95810 Presence of automatic (implantable) cardiac defibrillator: Secondary | ICD-10-CM

## 2021-09-17 NOTE — Progress Notes (Signed)
EPIC Encounter for ICM Monitoring  Patient Name: [name] is a 70 y.o. female Date: 09/17/2021 Primary Care Physican: [name], MD Primary Cardiologist: [place] Electrophysiologist: Allred Bi-V Pacing: >99%           09/06/2021 Weight: 280 lbs           Spoke with patient and heart failure questions reviewed.  Pt asymptomatic for fluid accumulation and feeling well.   Corvue thoracic impedance suggesting fluid levels returned to normal after taking extra Furosemide.   Prescribed: Furosemide 40 mg 1 tablet three times a week Monday, Wed and Friday.   Patient takes Furosemide 3 days week routinely.    Labs: 03/31/2021 Creatinine 1.11, BUN 19, Potassium 4.2, Sodium 141, GFR 54   Recommendations:  No changes and encouraged to call if experiencing any fluid symptoms.   Follow-up plan: ICM clinic phone appointment on 10/11/2021.  91 day device clinic remote transmission 10/13/2021.    EP/Cardiology Office Visits:  Next EP visit due 07/26/2021 (no recall).  Recall HF clinic 09/21/2021.   Copy of ICM check sent to Dr. [name].    3 month ICM trend: 09/14/2021.    12-14 Month ICM trend:       [name], RN 09/17/2021 8:51 AM

## 2021-10-11 ENCOUNTER — Ambulatory Visit (INDEPENDENT_AMBULATORY_CARE_PROVIDER_SITE_OTHER): Payer: HMO

## 2021-10-11 DIAGNOSIS — Z95810 Presence of automatic (implantable) cardiac defibrillator: Secondary | ICD-10-CM

## 2021-10-11 DIAGNOSIS — I5022 Chronic systolic (congestive) heart failure: Secondary | ICD-10-CM | POA: Diagnosis not present

## 2021-10-13 ENCOUNTER — Telehealth: Payer: Self-pay

## 2021-10-13 NOTE — Telephone Encounter (Signed)
Remote ICM transmission received.  Attempted call to patient regarding ICM remote transmission and no answer.  Voice mail box full.

## 2021-10-13 NOTE — Progress Notes (Signed)
EPIC Encounter for ICM Monitoring  Patient Name: [name] is a 70 y.o. female Date: 10/13/2021 Primary Care Physican: [name], MD Primary Cardiologist: [place] Electrophysiologist: Allred Bi-V Pacing: >99%           09/06/2021 Weight: 280 lbs           Attempted call to patient and unable to reach. Transmission reviewed.    Corvue thoracic impedance suggesting dryness starting 12/7 and trending back toward baseline.  Impedance was suggesting possible fluid accumulation from 11/27-12/01.   Prescribed: Furosemide 40 mg 1 tablet three times a week Monday, Wed and Friday.   Patient takes Furosemide 3 days week routinely.    Labs: 03/31/2021 Creatinine 1.11, BUN 19, Potassium 4.2, Sodium 141, GFR 54   Recommendations:  Unable to reach.     Follow-up plan: ICM clinic phone appointment on 11/15/2021.  91 day device clinic remote transmission 01/12/2022.    EP/Cardiology Office Visits:  Next EP visit due 07/26/2021 (no recall).  Recall HF clinic 09/21/2021.   Copy of ICM check sent to Dr. [name].    3 month ICM trend: 10/11/2021.    12-14 Month ICM trend:       [name], RN 10/13/2021 1:00 PM

## 2021-11-15 ENCOUNTER — Ambulatory Visit (INDEPENDENT_AMBULATORY_CARE_PROVIDER_SITE_OTHER): Payer: HMO

## 2021-11-15 DIAGNOSIS — Z95810 Presence of automatic (implantable) cardiac defibrillator: Secondary | ICD-10-CM

## 2021-11-15 DIAGNOSIS — I5022 Chronic systolic (congestive) heart failure: Secondary | ICD-10-CM | POA: Diagnosis not present

## 2021-11-16 NOTE — Progress Notes (Signed)
EPIC Encounter for ICM Monitoring  Patient Name: [name] is a 71 y.o. female Date: 11/16/2021 Primary Care Physican: [name], MD Primary Cardiologist: [place] Electrophysiologist: Allred Bi-V Pacing: >99%           09/06/2021 Weight: 280 lbs           Transmission reviewed.    Corvue thoracic impedance suggesting normal fluid levels.   Prescribed: Furosemide 40 mg 1 tablet three times a week Monday, Wed and Friday.   Patient takes Furosemide 3 days week routinely.    Labs: 03/31/2021 Creatinine 1.11, BUN 19, Potassium 4.2, Sodium 141, GFR 54   Recommendations:  No changes   Follow-up plan: ICM clinic phone appointment on 12/20/2021.  91 day device clinic remote transmission 01/12/2022.    EP/Cardiology Office Visits:  Next EP visit due 07/26/2021 (no recall).  Recall HF clinic 09/21/2021.   Copy of ICM check sent to Dr. [name].     3 month ICM trend: 11/13/2021.    [name], RN 11/16/2021 3:53 PM

## 2021-12-01 ENCOUNTER — Other Ambulatory Visit (HOSPITAL_COMMUNITY): Payer: Self-pay | Admitting: Internal Medicine

## 2021-12-20 ENCOUNTER — Ambulatory Visit (INDEPENDENT_AMBULATORY_CARE_PROVIDER_SITE_OTHER): Payer: HMO

## 2021-12-20 DIAGNOSIS — I5022 Chronic systolic (congestive) heart failure: Secondary | ICD-10-CM

## 2021-12-20 DIAGNOSIS — Z95810 Presence of automatic (implantable) cardiac defibrillator: Secondary | ICD-10-CM | POA: Diagnosis not present

## 2021-12-20 NOTE — Progress Notes (Signed)
EPIC Encounter for ICM Monitoring  Patient Name: [name] is a 71 y.o. female Date: 12/20/2021 Primary Care Physican: [name], MD Primary Cardiologist: [place] Electrophysiologist: Allred Bi-V Pacing: >99%           12/20/2021 Weight: 280 lbs           Spoke with patient and heart failure questions reviewed.  Pt asymptomatic for fluid accumulation.  She reports eating restaurant foods over the past weekend.     Corvue thoracic impedance suggesting possible fluid accumulation starting 2/16 and starting to trend back toward baseline.   Prescribed: Furosemide 40 mg 1 tablet three times a week Monday, Wed and Friday.   Patient takes Furosemide 3 days week routinely.    Labs: 03/31/2021 Creatinine 1.11, BUN 19, Potassium 4.2, Sodium 141, GFR 54   Recommendations:  Advised to limit salt intake.    Follow-up plan: ICM clinic phone appointment on 12/28/2021 to recheck fluid levels.  91 day device clinic remote transmission 01/12/2022.    EP/Cardiology Office Visits:  Next EP visit due 07/26/2021 (no recall).  Recall HF clinic 09/21/2021.   Copy of ICM check sent to Dr. [name].    3 month ICM trend: 12/20/2021.    12-14 Month ICM trend:     [name], RN 12/20/2021 12:34 PM

## 2021-12-23 ENCOUNTER — Encounter (HOSPITAL_BASED_OUTPATIENT_CLINIC_OR_DEPARTMENT_OTHER): Payer: HMO | Admitting: Internal Medicine

## 2021-12-23 DIAGNOSIS — I428 Other cardiomyopathies: Secondary | ICD-10-CM

## 2021-12-28 ENCOUNTER — Telehealth: Payer: Self-pay

## 2021-12-28 ENCOUNTER — Ambulatory Visit: Payer: HMO

## 2021-12-28 DIAGNOSIS — Z95810 Presence of automatic (implantable) cardiac defibrillator: Secondary | ICD-10-CM

## 2021-12-28 DIAGNOSIS — I5022 Chronic systolic (congestive) heart failure: Secondary | ICD-10-CM

## 2021-12-28 NOTE — Progress Notes (Signed)
EPIC Encounter for ICM Monitoring  Patient Name: [name] is a 71 y.o. female Date: 12/28/2021 Primary Care Physican: [name], MD Primary Cardiologist: [place] Electrophysiologist: Allred Bi-V Pacing: >99%           12/20/2021 Weight: 280 lbs           Attempted call to patient and unable to reach.   Transmission reviewed.     Corvue thoracic impedance suggesting fluid levels returned to baseline normal.   Prescribed: Furosemide 40 mg 1 tablet three times a week Monday, Wed and Friday.   Patient takes Furosemide 3 days week routinely.    Labs: 03/31/2021 Creatinine 1.11, BUN 19, Potassium 4.2, Sodium 141, GFR 54   Recommendations: Unable to reach.     Follow-up plan: ICM clinic phone appointment on 01/24/2022.  91 day device clinic remote transmission 01/12/2022.    EP/Cardiology Office Visits:  Next EP visit due 07/26/2021 (no recall).  Recall HF clinic 09/21/2021.   Copy of ICM check sent to Dr. [name].    3 month ICM trend: 12/28/2021.    12-14 Month ICM trend:     [name], RN 12/28/2021 4:13 PM

## 2021-12-28 NOTE — Telephone Encounter (Signed)
Remote ICM transmission received.  Attempted call to patient regarding ICM remote transmission and no answer.  

## 2022-01-05 ENCOUNTER — Other Ambulatory Visit (HOSPITAL_COMMUNITY): Payer: Self-pay | Admitting: Internal Medicine

## 2022-01-06 ENCOUNTER — Encounter (HOSPITAL_BASED_OUTPATIENT_CLINIC_OR_DEPARTMENT_OTHER): Payer: Self-pay | Admitting: Internal Medicine

## 2022-01-06 ENCOUNTER — Other Ambulatory Visit: Payer: Self-pay

## 2022-01-06 ENCOUNTER — Ambulatory Visit (INDEPENDENT_AMBULATORY_CARE_PROVIDER_SITE_OTHER): Payer: HMO | Admitting: Internal Medicine

## 2022-01-06 VITALS — BP 92/50 | HR 85 | Ht 61.5 in | Wt 276.0 lb

## 2022-01-06 DIAGNOSIS — I480 Paroxysmal atrial fibrillation: Secondary | ICD-10-CM

## 2022-01-06 DIAGNOSIS — Z95810 Presence of automatic (implantable) cardiac defibrillator: Secondary | ICD-10-CM | POA: Diagnosis not present

## 2022-01-06 DIAGNOSIS — I428 Other cardiomyopathies: Secondary | ICD-10-CM

## 2022-01-06 DIAGNOSIS — I5022 Chronic systolic (congestive) heart failure: Secondary | ICD-10-CM

## 2022-01-06 DIAGNOSIS — I10 Essential (primary) hypertension: Secondary | ICD-10-CM

## 2022-01-06 NOTE — Progress Notes (Signed)
? ?PCP: [name], MD ?Primary Cardiologist: Dr Haroldine Laws ?Primary EP: Dr [name] ? ?[name] is a 71 y.o. female who presents today for routine electrophysiology followup.  Since last being seen in our clinic, the patient reports doing reasonably well. She is not very active.  She has DJD which limits activity primarily.  SOB with moderate activity. Today, she denies symptoms of palpitations, chest pain,    lower extremity edema, dizziness, presyncope, syncope, or ICD shocks.     The patient is otherwise without complaint today.  ? ?Past Medical History:  ?Diagnosis Date  ? Aortic dissection ([place]) 12/2008  ? Type B  ? Arthritis   ? lower back  ? Atrial fibrillation ([place])   ? post op (left thoracotomy) 11/12 req amio/DCCV  ? Breast cancer ([place]) 07/2009  ? Stage 3 lumpectomy, radiation, chemotherapy; finished chemo October, felt to be in remission  ? Cardiac defibrillator in place   ? St. Jude [place]) 9/12; LV lead placement unsuccessful;  s/p left thoracotomy with placement of epicardial LV lead 09/2011 (Dr. PVT)  ? CHF (congestive heart failure) ([place])   ? Chronic systolic dysfunction of left ventricle   ? Depression   ? DVT (deep venous thrombosis) ([place]) 10/18/2010  ? GERD (gastroesophageal reflux disease)   ? Glaucoma   ? Hiatal hernia   ? small hiatal hernia  ? History of shingles   ? Hyperlipidemia   ? Hypertension   ? Left bundle branch block   ? Nonischemic cardiomyopathy ([place])   ? Admission 12/11 with EF 25%, normal coronary arteries by cath 12/11; NICM presumed to be secondary to chemotherapy for breast cancer  ? Obesity   ? [name]   ? with exertion  ? ?Past Surgical History:  ?Procedure Laterality Date  ? BREAST LUMPECTOMY    ? Right breast  ? CARDIAC CATHETERIZATION    ? 2011  ? CARDIAC DEFIBRILLATOR PLACEMENT    ? EP IMPLANTABLE DEVICE N/A 01/12/2016  ? STJ ICD Unify Assura gen change, Dr. [name]  ? OOPHORECTOMY    ? Single  ? THORACOTOMY  09/16/2011  ? Procedure: THORACOTOMY MAJOR;   Surgeon: Tharon Aquas Adelene Idler, MD;  Location: [facility] OR;  Service: Thoracic;  Laterality: Left;  left anterolateral Thoracotomy for placement of St. Jude epicardial pacing lead   ? TONSILLECTOMY    ? TUBAL LIGATION    ? Bilateral  ? ? ?ROS- all systems are reviewed and negative except as per HPI above ? ?Current Outpatient Medications  ?Medication Sig Dispense Refill  ? alendronate (FOSAMAX) 70 MG tablet Take 70 mg by mouth once a week. Take with a full glass of water on an empty stomach. Takes on Monday    ? ALPRAZolam (XANAX) 1 MG tablet Take 0.5 mg by mouth daily as needed for anxiety. Anxiety ?    ? aspirin 81 MG EC tablet Take 81 mg by mouth daily.    ? azelastine (OPTIVAR) 0.05 % ophthalmic solution Place 1 drop into both eyes daily.    ? benzonatate (TESSALON) 200 MG capsule Take 200 mg by mouth 3 (three) times daily as needed.    ? Bepotastine Besilate 1.5 % SOLN Place 1 drop into both eyes daily as needed. Dry eyes ?    ? bimatoprost (LUMIGAN) 0.03 % ophthalmic solution Place 1 drop into both eyes at bedtime. Reported on 01/12/2016    ? Calcium Carbonate-Vitamin D (CALCIUM-VITAMIN D3) 600-200 MG-UNIT TABS Take 1 tablet by mouth 2 (  two) times daily.    ? carvedilol (COREG) 12.5 MG tablet TAKE ONE TABLET BY MOUTH TWICE DAILY 180 tablet 3  ? cetirizine (ZYRTEC) 10 MG tablet Take 1 tablet by mouth daily.    ? cholecalciferol (VITAMIN D) 1000 UNITS tablet Take 1,000 Units by mouth 2 (two) times daily.    ? COLCRYS 0.6 MG tablet TAKE ONE TABLET BY MOUTH EVERY DAY 30 tablet 1  ? cyclobenzaprine (FLEXERIL) 10 MG tablet Take 1 tablet (10 mg total) by mouth 3 (three) times daily as needed for muscle spasms. 30 tablet 0  ? diclofenac sodium (VOLTAREN) 1 % GEL APPLY THREE GRAMS TO THREE LARGE JOINTS UP TO THREE TIMES DAILY AS NEEDED 3 Tube 3  ? ENTRESTO 49-51 MG TAKE ONE TABLET BY MOUTH TWICE DAILY 180 tablet 3  ? furosemide (LASIX) 40 MG tablet TAKE ONE TABLET BY MOUTH 90 tablet 3  ? HYDROcodone-acetaminophen  (NORCO/VICODIN) 5-325 MG tablet TAKE ONE TABLET BY MOUTH EVERY 6 HOURS PRN    ? ivabradine ([name]) 5 MG TABS tablet Take 0.5 tablets (2.5 mg total) by mouth 2 (two) times daily. 90 tablet 3  ? levothyroxine (SYNTHROID) 25 MCG tablet Take 25 mcg by mouth daily.    ? montelukast (SINGULAIR) 10 MG tablet Take 10 mg by mouth at bedtime. For allergies & congestion    ? Multiple Vitamin (MULTIVITAMIN) tablet Take 1 tablet by mouth daily.    ? omeprazole (PRILOSEC) 40 MG capsule TAKE ONE CAPSULE BY MOUTH TWICE DAILY 60 capsule 8  ? PARoxetine (PAXIL) 20 MG tablet Take 20 mg by mouth daily.  1  ? Polyethyl Glycol-Propyl Glycol (SYSTANE OP) Apply 1 drop to eye daily as needed (dry eyes).     ? rosuvastatin (CRESTOR) 5 MG tablet TAKE ONE TABLET BY MOUTH EVERY EVENING 90 tablet 3  ? spironolactone (ALDACTONE) 25 MG tablet TAKE ONE TABLET BY MOUTH daily 90 tablet 3  ? traMADol (ULTRAM) 50 MG tablet Take 50 mg by mouth every 6 (six) hours as needed for pain.    ? ?No current facility-administered medications for this visit.  ? ? ?Physical Exam: ?Vitals:  ? 01/06/22 1455  ?BP: (!) 92/50  ?Pulse: 85  ?SpO2: 94%  ?Weight: 276 lb (125.2 kg)  ?Height: 5' 1.5" (1.562 m)  ? ? ?GEN- The patient is overweight appearing, alert and oriented x 3 today.   ?Head- normocephalic, atraumatic ?Eyes-  Sclera clear, conjunctiva pink ?Ears- hearing intact ?Oropharynx- clear ?Lungs- Clear to ausculation bilaterally, normal work of breathing ?Chest- ICD pocket is well healed ?Heart- Regular rate and rhythm, no murmurs, rubs or gallops, PMI not laterally displaced ?GI- soft, NT, ND, + BS ?Extremities- no clubbing, cyanosis, or edema ? ?ICD interrogation- reviewed in detail today,  See PACEART report ? ?ekg tracing ordered today is personally reviewed and shows sinus, BiV paced ? ?Wt Readings from Last 3 Encounters:  ?01/06/22 276 lb (125.2 kg)  ?03/24/21 277 lb 9.6 oz (125.9 kg)  ?07/24/20 277 lb (125.6 kg)  ? ? ?Assessment and Plan: ? ?1.  Chronic  systolic dysfunction ?euvolemic today ?Stable on an appropriate medical regimen ?Normal BiV ICD function ?See Claudia Desanct[facility] Art report ?No changes today ?she is not device dependant today ?followed in ICM device clinic ? ?2. Paroxysmal atrial fibrillation ?Occurred post operatively remotely without recurrence by device interrogation ? ?3. HTN ?Stable ?No change required today ? ?4. Type B aortic dissection ?Stable ?No change required today ? ?5. Morbid obesity ?Body mass index is  51.31 kg/m?. ?Lifestyle modification advised ? ?Return in a year ? ?Thompson Grayer MD, FACC ?01/06/2022 ?3:06 PM ? ?

## 2022-01-06 NOTE — Patient Instructions (Addendum)
Medication Instructions:  ?Your physician recommends that you continue on your current medications as directed. Please refer to the Current Medication list given to you today. ? ?Labwork: ?None ordered. ? ?Testing/Procedures: ?None ordered. ? ?Follow-Up: ?Your physician wants you to follow-up in: one year with Dr. [name] ?You will receive a reminder letter in the mail two months in advance. If you don't receive a letter, please call our office to schedule the follow-up appointment. ? ? ?Remote monitoring is used to monitor your ICD from home. This monitoring reduces the number of office visits required to check your device to one time per year. It allows [place] to keep an eye on the functioning of your device to ensure it is working properly. You are scheduled for a device check from home on 01/12/2022. You may send your transmission at any time that day. If you have a wireless device, the transmission will be sent automatically. After your physician reviews your transmission, you will receive a postcard with your next transmission date. ? ?Any Other Special Instructions Will Be Listed Below (If Applicable). ? ?If you need a refill on your cardiac medications before your next appointment, please call your pharmacy.  ? ? ? ? ?

## 2022-01-10 NOTE — Progress Notes (Incomplete)
ADVANCED HF CLINIC NOTE   Patient ID: [name], female   DOB: May 11, 1951, 71 y.o.   MRN: [id number] Oncologist: Dr. Philipp Ovens at [facility] EP: Dr. [name] Dr. Bea Graff in [place] HF: [place]  HPI: [name] is a 71 y.o. woman with a history of HTN, Type B aortic dissection (2010) managed with medical therapy by Dr. Prescott Gum and has healed, LBBB, systolic heart failure due to NICM with EF 10-15% Cath Dec 2011 showed normal arteries.    She was diagnosed with breast cancer in 2009 s/p lumpectomy, XRT, and chemotherapy.  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   She underwent St Jude Bi-V ICD implantation Sept 2012 but unable to place LV lead.  She eventually brought back in Nov 2012 for left thoracotomy and placement of epicardial LV lead placement by Dr. Prescott Gum.  She also had a sleep study that showed mild OSA.  No clinical intervention occurred.    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 Started PPI and famotidine. Question also raised about possible trial off ARNI.   She presents today for regular follow up. Previously corlanor cut back to 2.5 mg BID with occasional bradycardia. Here with her husband. Overall relatively stable NYHA II-III but yesterday felt lightheaded for several hours. No palpitations or ICD firings. SBP 95.    ICD interrogation: No VT/AF. Volume low. 93% VP. Personally reviewed   Echo today 03/24/21: EF 20-25% RV ok Personally reviewed  Echo 07/23/20 EF 20-25% with dyssynchrony RV ok   Echo 01/10/20: LVEF 10% with severe dyssynchrony. RV ok    Echo 12/2017 LVEF 20%, RV ok.    10/10/2012 CPX Peak VO2: 10.6 % predicted peak VO2: 64.4% VE/VCO2 slope: 26.9 OUES: 1.18 Peak RER: 1.29 Ventilatory Threshold: 7.3 % predicted peak VO2: 44.4% Peak RR 23 Peak Ventilation: 29.8 VE/MVV: 32.6% PETCO2 at peak: 43  01/2014 CPX Peak VO2:  10.3 (66.4% predicted peak VO2) VE/VCO2 slope: 25.0 OUES: 1.30 Peak RER: 1.20  CPX (8/16): Peak VO2 10.1 VE/VCO2 slope 28.4 RER 1.14 OUES 1.3 PFTs mildly restrictive No significant change compared to prior.  Limited more by obesity and restrictive lung physiology than heart (mild circulatory limitation).   ECHO 01/02/13 EF 78% Grade 1 diastolic dysfunction. RV ok. No PAH ECHO 07/10/13 EF 20% RV ok ECHO 10/13/14 EF 15-20% RV ok Echo 10/2015 EF 15%  Echo 09/30/16 LVEF 10-15%, Grade 1 DD, mild PI, Mild TR. Normal RV.  Review of systems complete and found to be negative unless listed in HPI.      Past Medical History:  Diagnosis Date   Aortic dissection ([place]) 12/2008   Type B   Arthritis    lower back   Atrial fibrillation ([place])    post op (left thoracotomy) 11/12 req amio/DCCV   Breast cancer ([place]) 07/2009   Stage 3 lumpectomy, radiation, chemotherapy; finished chemo October, felt to be in remission   Cardiac defibrillator in place    St. Jude (JA) 9/12; LV lead placement unsuccessful;  s/p left thoracotomy with placement of epicardial LV lead 09/2011 (Dr. PVT)   CHF (congestive heart failure) ([facility])    Chronic systolic dysfunction of left ventricle    Depression    DVT (deep venous thrombosis) ([place]) 10/18/2010   GERD (gastroesophageal reflux disease)    Glaucoma    Hiatal hernia    small hiatal hernia   History of  shingles    Hyperlipidemia    Hypertension    Left bundle branch block    Nonischemic cardiomyopathy ([place])    Admission 12/11 with EF 25%, normal coronary arteries by cath 12/11; NICM presumed to be secondary to chemotherapy for breast cancer   Obesity    [name]    with exertion   Current Outpatient Medications  Medication Sig Dispense Refill   alendronate (FOSAMAX) 70 MG tablet Take 70 mg by mouth once a week. Take with a full glass of water on an empty stomach. Takes on Monday     ALPRAZolam (XANAX) 1 MG tablet Take 0.5 mg by mouth daily as  needed for anxiety. Anxiety      aspirin 81 MG EC tablet Take 81 mg by mouth daily.     azelastine (OPTIVAR) 0.05 % ophthalmic solution Place 1 drop into both eyes daily.     benzonatate (TESSALON) 200 MG capsule Take 200 mg by mouth 3 (three) times daily as needed.     Bepotastine Besilate 1.5 % SOLN Place 1 drop into both eyes daily as needed. Dry eyes      bimatoprost (LUMIGAN) 0.03 % ophthalmic solution Place 1 drop into both eyes at bedtime. Reported on 01/12/2016     Calcium Carbonate-Vitamin D (CALCIUM-VITAMIN D3) 600-200 MG-UNIT TABS Take 1 tablet by mouth 2 (two) times daily.     carvedilol (COREG) 12.5 MG tablet TAKE ONE TABLET BY MOUTH TWICE DAILY 180 tablet 3   cetirizine (ZYRTEC) 10 MG tablet Take 1 tablet by mouth daily.     cholecalciferol (VITAMIN D) 1000 UNITS tablet Take 1,000 Units by mouth 2 (two) times daily.     COLCRYS 0.6 MG tablet TAKE ONE TABLET BY MOUTH EVERY DAY 30 tablet 1   cyclobenzaprine (FLEXERIL) 10 MG tablet Take 1 tablet (10 mg total) by mouth 3 (three) times daily as needed for muscle spasms. 30 tablet 0   diclofenac sodium (VOLTAREN) 1 % GEL APPLY THREE GRAMS TO THREE LARGE JOINTS UP TO THREE TIMES DAILY AS NEEDED 3 Tube 3   ENTRESTO 49-51 MG TAKE ONE TABLET BY MOUTH TWICE DAILY 180 tablet 3   furosemide (LASIX) 40 MG tablet TAKE ONE TABLET BY MOUTH 90 tablet 3   HYDROcodone-acetaminophen (NORCO/VICODIN) 5-325 MG tablet TAKE ONE TABLET BY MOUTH EVERY 6 HOURS PRN     ivabradine ([name]) 5 MG TABS tablet Take 0.5 tablets (2.5 mg total) by mouth 2 (two) times daily. 90 tablet 3   levothyroxine (SYNTHROID) 25 MCG tablet Take 25 mcg by mouth daily.     montelukast (SINGULAIR) 10 MG tablet Take 10 mg by mouth at bedtime. For allergies & congestion     Multiple Vitamin (MULTIVITAMIN) tablet Take 1 tablet by mouth daily.     omeprazole (PRILOSEC) 40 MG capsule TAKE ONE CAPSULE BY MOUTH TWICE DAILY 60 capsule 8   PARoxetine (PAXIL) 20 MG tablet Take 20 mg by mouth  daily.  1   Polyethyl Glycol-Propyl Glycol (SYSTANE OP) Apply 1 drop to eye daily as needed (dry eyes).      rosuvastatin (CRESTOR) 5 MG tablet TAKE ONE TABLET BY MOUTH EVERY EVENING 90 tablet 3   spironolactone (ALDACTONE) 25 MG tablet TAKE ONE TABLET BY MOUTH daily 90 tablet 3   traMADol (ULTRAM) 50 MG tablet Take 50 mg by mouth every 6 (six) hours as needed for pain.     No current facility-administered medications for this visit.   PHYSICAL EXAM: There were  no vitals filed for this visit.  Wt Readings from Last 3 Encounters:  01/06/22 125.2 kg (276 lb)  03/24/21 125.9 kg (277 lb 9.6 oz)  07/24/20 125.6 kg (277 lb)   Physical Exam  General:  Well appearing. No resp difficulty HEENT: normal Neck: supple. Hard to see JVD. Carotids 2+ bilat; no bruits. No lymphadenopathy or thryomegaly appreciated. Cor: PMI nondisplaced. Regular rate & rhythm. No rubs, gallops or murmurs. Lungs: clear Abdomen: obese soft, nontender, nondistended. No hepatosplenomegaly. No bruits or masses. Good bowel sounds. Extremities: no cyanosis, clubbing, rash, edema + support stockings Neuro: alert & orientedx3, cranial nerves grossly intact. moves all 4 extremities w/o difficulty. Affect pleasant    ASSESSMENT & PLAN:  1. Chronic systolic CHF: Nonischemic cardiomyopathy.   - Echo 09/30/16 LVEF 10-15% s/p St Jude Bi-V ICD with epicardial LV lead.  - Echo 12/2017 EF 20% stable. - CPX in 8/16 was similar to the past: it appeared that the major limitation was from body habitus/deconditioning and not cardiac - Echo 01/10/20 LVEF 10-15% severe dyssynchrony, RV ok  - Underwent AV optimization of CRT in 4/21 QRS 170 -> 164m - Echo 07/23/20 EF 20-25% with dyssynchrony RV ok  - ICD interrogation: No VT/AF. Volume low. 93% VP. Personally reviewed - Echo today 03/24/21: EF 20-25% RV ok Personally reviewed - Stable NYHA III symptoms, confounded by severe arthritis.  - Volume status low on ICD today - Continue Entresto  49/51 mg BID. Unable to titrate due to low BP - Continue lasix 40 mg M/W/F. Can take extra as needed.  - Continue coreg 12.5 mg BID.  - Continue spironolactone 25 daily.  - Increase corlanor to 5  bid - We discussed SGLT2i in past but concern for UTIs so will avoid - Likely would not qualify for advanced therapies with her comorbidities.  - Will screen for Anthem or Batwire today - BMET next week with PCP  2. Paroxysmal atrial fibrillation:  - This was post-op.  - In NSR on ECG today. No AF on device - Continue ASA 81 mg daily.  - If recurs, will need antiocoag with CHA2DS2/VAS of 4.  3. Type B aortic dissection:  - Stable. BP stable.  4. HTN  - BP runs low. Remains asymptomatic  5. Arthritis - Knees and Hips. Not candidate for surgery with severe LV dysfunction.  - No change 6. HL  - Continue crestor   JRafael Bihari FNorth [place]3/13/2023

## 2022-01-12 ENCOUNTER — Ambulatory Visit (INDEPENDENT_AMBULATORY_CARE_PROVIDER_SITE_OTHER): Payer: HMO

## 2022-01-12 ENCOUNTER — Encounter (HOSPITAL_COMMUNITY): Payer: HMO

## 2022-01-12 DIAGNOSIS — I5022 Chronic systolic (congestive) heart failure: Secondary | ICD-10-CM | POA: Diagnosis not present

## 2022-01-12 DIAGNOSIS — I428 Other cardiomyopathies: Secondary | ICD-10-CM

## 2022-01-12 LAB — CUP PACEART REMOTE DEVICE CHECK
Battery Remaining Longevity: 16 mo
Battery Remaining Percentage: 25 %
Battery Voltage: 2.81 V
Brady Statistic AP VP Percent: 2.5 %
Brady Statistic AP VS Percent: 1 %
Brady Statistic AS VP Percent: 97 %
Brady Statistic AS VS Percent: 1 %
Brady Statistic RA Percent Paced: 2.3 %
Date Time Interrogation Session: 20230315020015
HighPow Impedance: 99 Ohm
HighPow Impedance: 99 Ohm
Implantable Lead Implant Date: 20120914
Implantable Lead Implant Date: 20120914
Implantable Lead Implant Date: 20121116
Implantable Lead Location: 753858
Implantable Lead Location: 753859
Implantable Lead Location: 753860
Implantable Lead Model: 511212
Implantable Lead Serial Number: 194059
Implantable Pulse Generator Implant Date: 20170314
Lead Channel Impedance Value: 260 Ohm
Lead Channel Impedance Value: 400 Ohm
Lead Channel Impedance Value: 410 Ohm
Lead Channel Pacing Threshold Amplitude: 0.875 V
Lead Channel Pacing Threshold Amplitude: 1.25 V
Lead Channel Pacing Threshold Amplitude: 1.5 V
Lead Channel Pacing Threshold Pulse Width: 0.5 ms
Lead Channel Pacing Threshold Pulse Width: 0.5 ms
Lead Channel Pacing Threshold Pulse Width: 0.7 ms
Lead Channel Sensing Intrinsic Amplitude: 0.7 mV
Lead Channel Sensing Intrinsic Amplitude: 11.7 mV
Lead Channel Setting Pacing Amplitude: 1.875
Lead Channel Setting Pacing Amplitude: 2.5 V
Lead Channel Setting Pacing Amplitude: 2.75 V
Lead Channel Setting Pacing Pulse Width: 0.5 ms
Lead Channel Setting Pacing Pulse Width: 0.7 ms
Lead Channel Setting Sensing Sensitivity: 0.5 mV
Pulse Gen Serial Number: 7328650

## 2022-01-18 ENCOUNTER — Other Ambulatory Visit (HOSPITAL_COMMUNITY): Payer: Self-pay | Admitting: Internal Medicine

## 2022-01-20 ENCOUNTER — Other Ambulatory Visit (HOSPITAL_COMMUNITY): Payer: Self-pay | Admitting: Internal Medicine

## 2022-01-24 ENCOUNTER — Ambulatory Visit (INDEPENDENT_AMBULATORY_CARE_PROVIDER_SITE_OTHER): Payer: HMO

## 2022-01-24 DIAGNOSIS — I5022 Chronic systolic (congestive) heart failure: Secondary | ICD-10-CM

## 2022-01-24 DIAGNOSIS — Z95810 Presence of automatic (implantable) cardiac defibrillator: Secondary | ICD-10-CM

## 2022-01-24 NOTE — Progress Notes (Signed)
Remote ICD transmission.   

## 2022-01-25 ENCOUNTER — Telehealth: Payer: Self-pay

## 2022-01-25 NOTE — Progress Notes (Signed)
EPIC Encounter for ICM Monitoring ? ?Patient Name: [name] is a 71 y.o. female ?Date: 01/25/2022 ?Primary Care Physican: [name], MD ?Primary Cardiologist: Bensimhon ?Electrophysiologist: Allred ?Bi-V Pacing: >99%           ?12/20/2021 Weight: 280 lbs  ?  ?       Attempted call to patient and unable to reach.  Left detailed message per DPR regarding transmission. Transmission reviewed.  ?  ?Corvue thoracic impedance normal suggesting possible fluid accumulation from 3/16-3/25. ?  ?Prescribed: Furosemide 40 mg 1 tablet three times a week Monday, Wed and Friday.   Patient takes Furosemide 3 days week routinely.  ?  ?Labs: ?03/31/2021 Creatinine 1.11, BUN 19, Potassium 4.2, Sodium 141, GFR 54 ?  ?Recommendations: Unable to reach.   ?  ?Follow-up plan: ICM clinic phone appointment on 01/24/2022.  91 day device clinic remote transmission 01/12/2022.  ?  ?EP/Cardiology Office Visits:  Next EP visit due 07/26/2021 (no recall).  Recall HF clinic 09/21/2021. ?  ?Copy of ICM check sent to Dr. [name].   ? ?3 month ICM trend: 01/24/2022. ? ? ? ?12-14 Month ICM trend:  ? ? ? ?[name], RN ?01/25/2022 ?3:21 PM ? ?

## 2022-01-25 NOTE — Telephone Encounter (Signed)
Remote ICM transmission received.  Attempted call to patient regarding ICM remote transmission and left detailed message per DPR.  Advised to return call for any fluid symptoms or questions. Next ICM remote transmission scheduled 02/28/2022.   ? ?

## 2022-01-31 ENCOUNTER — Ambulatory Visit (HOSPITAL_COMMUNITY)
Admission: RE | Admit: 2022-01-31 | Discharge: 2022-01-31 | Disposition: A | Discharge status: Home / Self Care | Payer: HMO | Source: Ambulatory Visit | Attending: Family Medicine | Admitting: Family Medicine

## 2022-01-31 ENCOUNTER — Encounter (HOSPITAL_COMMUNITY): Payer: Self-pay

## 2022-01-31 VITALS — BP 90/62 | HR 93 | Wt 279.0 lb

## 2022-01-31 DIAGNOSIS — Z79899 Other long term (current) drug therapy: Secondary | ICD-10-CM | POA: Diagnosis not present

## 2022-01-31 DIAGNOSIS — E785 Hyperlipidemia, unspecified: Secondary | ICD-10-CM | POA: Diagnosis not present

## 2022-01-31 DIAGNOSIS — E669 Obesity, unspecified: Secondary | ICD-10-CM | POA: Insufficient documentation

## 2022-01-31 DIAGNOSIS — R053 Chronic cough: Secondary | ICD-10-CM | POA: Insufficient documentation

## 2022-01-31 DIAGNOSIS — I447 Left bundle-branch block, unspecified: Secondary | ICD-10-CM | POA: Insufficient documentation

## 2022-01-31 DIAGNOSIS — Z95810 Presence of automatic (implantable) cardiac defibrillator: Secondary | ICD-10-CM | POA: Diagnosis not present

## 2022-01-31 DIAGNOSIS — I10 Essential (primary) hypertension: Secondary | ICD-10-CM

## 2022-01-31 DIAGNOSIS — R001 Bradycardia, unspecified: Secondary | ICD-10-CM | POA: Diagnosis not present

## 2022-01-31 DIAGNOSIS — M47816 Spondylosis without myelopathy or radiculopathy, lumbar region: Secondary | ICD-10-CM | POA: Diagnosis not present

## 2022-01-31 DIAGNOSIS — Z7982 Long term (current) use of aspirin: Secondary | ICD-10-CM | POA: Diagnosis not present

## 2022-01-31 DIAGNOSIS — I7100 Dissection of unspecified site of aorta: Secondary | ICD-10-CM | POA: Diagnosis not present

## 2022-01-31 DIAGNOSIS — I428 Other cardiomyopathies: Secondary | ICD-10-CM | POA: Diagnosis not present

## 2022-01-31 DIAGNOSIS — M170 Bilateral primary osteoarthritis of knee: Secondary | ICD-10-CM | POA: Insufficient documentation

## 2022-01-31 DIAGNOSIS — G4733 Obstructive sleep apnea (adult) (pediatric): Secondary | ICD-10-CM | POA: Diagnosis not present

## 2022-01-31 DIAGNOSIS — Z853 Personal history of malignant neoplasm of breast: Secondary | ICD-10-CM | POA: Diagnosis not present

## 2022-01-31 DIAGNOSIS — I110 Hypertensive heart disease with heart failure: Secondary | ICD-10-CM | POA: Insufficient documentation

## 2022-01-31 DIAGNOSIS — Z9221 Personal history of antineoplastic chemotherapy: Secondary | ICD-10-CM | POA: Diagnosis not present

## 2022-01-31 DIAGNOSIS — I5022 Chronic systolic (congestive) heart failure: Secondary | ICD-10-CM | POA: Insufficient documentation

## 2022-01-31 DIAGNOSIS — I959 Hypotension, unspecified: Secondary | ICD-10-CM | POA: Insufficient documentation

## 2022-01-31 DIAGNOSIS — Z923 Personal history of irradiation: Secondary | ICD-10-CM | POA: Diagnosis not present

## 2022-01-31 DIAGNOSIS — Z6841 Body Mass Index (BMI) 40.0 and over, adult: Secondary | ICD-10-CM | POA: Diagnosis not present

## 2022-01-31 DIAGNOSIS — M1990 Unspecified osteoarthritis, unspecified site: Secondary | ICD-10-CM

## 2022-01-31 DIAGNOSIS — M160 Bilateral primary osteoarthritis of hip: Secondary | ICD-10-CM | POA: Insufficient documentation

## 2022-01-31 DIAGNOSIS — Z8679 Personal history of other diseases of the circulatory system: Secondary | ICD-10-CM | POA: Insufficient documentation

## 2022-01-31 DIAGNOSIS — I480 Paroxysmal atrial fibrillation: Secondary | ICD-10-CM | POA: Insufficient documentation

## 2022-01-31 NOTE — Progress Notes (Signed)
?Advanced Heart Failure Clinic Note ? ?Oncologist: Dr. Philipp Ovens at [facility] ?EP: Dr. [name] ?PCP: [name], MD ?HF: Bensimhon ? ?HPI: ?[name] is a 71 y.o. woman with a history of HTN, Type B aortic dissection (2010) managed with medical therapy by Dr. Prescott Gum and has healed, LBBB, systolic heart failure due to NICM with EF 10-15% Cath Dec 2011 showed normal arteries.   ? ?She was diagnosed with breast cancer in 2009 s/p lumpectomy, XRT, and chemotherapy.  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   She underwent St Jude Bi-V ICD implantation Sept 2012 but unable to place LV lead.  She eventually brought back in Nov 2012 for left thoracotomy and placement of epicardial LV lead placement by Dr. Prescott Gum.  She also had a sleep study that showed mild OSA.  No clinical intervention occurred.   ? 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 Started PPI and famotidine. Question also raised about possible trial off ARNI.  ? ?Follow up 5/22 , stable NYHA II-III. Echo showed EF 20-25%, RV ok. Screened for [employer]. ? ?Today she returns for HF follow up with her husband. Overall feeling fine. She is SOB with inclines and stairs. Physically limited by knee and hip OA. She is fatigued if she exerts herself. Takes extra Lasix weekly on the weekends. Denies abnormal bleeding, palpitations, CP, dizziness, edema, or PND/Orthopnea. Appetite ok. No fever or chills. Weight at home 277 pounds. Taking all medications. Asking when she is due to repeat her echo. ? ?Cardiac Studies: ?- Echo (5/22): EF 20-25% RV ok  ? ?- Echo (9/21): EF 20-25% with dyssynchrony RV ok  ? ?- Echo (3/21): EF 10% with severe dyssynchrony. RV ok  ? ?- Echo (2/19): EF 20%, RV ok. ? ? ?- CPX (12/13) ?Peak VO2: 10.6 % predicted peak VO2: 64.4% ?VE/VCO2 slope: 26.9 ?OUES: 1.18 ?Peak RER: 1.29 ?Ventilatory Threshold: 7.3 % predicted  peak VO2: 44.4% ?Peak RR 23 ?Peak Ventilation: 29.8 ?VE/MVV: 32.6% ?PETCO2 at peak: 43 ? ?- CPX (4/15): ?Peak VO2: 10.3 (66.4% predicted peak VO2) ?VE/VCO2 slope: 25.0 ?OUES: 1.30 ?Peak RER: 1.20 ? ?- CPX (8/16): ?Peak VO2 10.1 ?VE/VCO2 slope 28.4 ?RER 1.14 ?OUES 1.3 ?PFTs mildly restrictive ?No significant change compared to prior.  Limited more by obesity and restrictive lung physiology than heart (mild circulatory limitation).  ? ?Echo 01/02/13 EF 30% Grade 1 diastolic dysfunction. RV ok. No PAH ?Echo 07/10/13 EF 20% RV ok ?Echo 10/13/14 EF 15-20% RV ok ?Echo 10/2015 EF 15%  ?Echo 09/30/16 LVEF 10-15%, Grade 1 DD, mild PI, Mild TR. Normal RV. ? ?Review of systems complete and found to be negative unless listed in HPI.   ?   ?Past Medical History:  ?Diagnosis Date  ? Aortic dissection ([place]) 12/2008  ? Type B  ? Arthritis   ? lower back  ? Atrial fibrillation ([place])   ? post op (left thoracotomy) 11/12 req amio/DCCV  ? Breast cancer ([place]) 07/2009  ? Stage 3 lumpectomy, radiation, chemotherapy; finished chemo October, felt to be in remission  ? Cardiac defibrillator in place   ? St. Jude [place]) 9/12; LV lead placement unsuccessful;  s/p left thoracotomy with placement of epicardial LV lead 09/2011 (Dr. PVT)  ? CHF (congestive heart failure) ([place])   ? Chronic systolic dysfunction of left ventricle   ? Depression   ? DVT (deep venous thrombosis) ([place])  10/18/2010  ? GERD (gastroesophageal reflux disease)   ? Glaucoma   ? Hiatal hernia   ? small hiatal hernia  ? History of shingles   ? Hyperlipidemia   ? Hypertension   ? Left bundle branch block   ? Nonischemic cardiomyopathy ([place])   ? Admission 12/11 with EF 25%, normal coronary arteries by cath 12/11; NICM presumed to be secondary to chemotherapy for breast cancer  ? Obesity   ? [name]   ? with exertion  ? ?Current Outpatient Medications  ?Medication Sig Dispense Refill  ? ALPRAZolam (XANAX) 1 MG tablet Take 0.5 mg by mouth daily as needed for anxiety. Anxiety ?     ? aspirin 81 MG EC tablet Take 81 mg by mouth daily.    ? azelastine (OPTIVAR) 0.05 % ophthalmic solution Place 1 drop into both eyes daily.    ? benzonatate (TESSALON) 200 MG capsule Take 200 mg by mouth 3 (three) times daily as needed.    ? Bepotastine Besilate 1.5 % SOLN Place 1 drop into both eyes daily as needed. Dry eyes ?    ? bimatoprost (LUMIGAN) 0.03 % ophthalmic solution Place 1 drop into both eyes at bedtime. Reported on 01/12/2016    ? Calcium Carbonate-Vitamin D (CALCIUM-VITAMIN D3) 600-200 MG-UNIT TABS Take 1 tablet by mouth 2 (two) times daily.    ? carvedilol (COREG) 12.5 MG tablet TAKE ONE TABLET BY MOUTH TWICE DAILY 180 tablet 3  ? cetirizine (ZYRTEC) 10 MG tablet Take 1 tablet by mouth daily.    ? cholecalciferol (VITAMIN D) 1000 UNITS tablet Take 1,000 Units by mouth 2 (two) times daily.    ? COLCRYS 0.6 MG tablet TAKE ONE TABLET BY MOUTH EVERY DAY 30 tablet 1  ? cyclobenzaprine (FLEXERIL) 10 MG tablet Take 1 tablet (10 mg total) by mouth 3 (three) times daily as needed for muscle spasms. 30 tablet 0  ? diclofenac sodium (VOLTAREN) 1 % GEL APPLY THREE GRAMS TO THREE LARGE JOINTS UP TO THREE TIMES DAILY AS NEEDED 3 Tube 3  ? ENTRESTO 49-51 MG TAKE ONE TABLET BY MOUTH TWICE DAILY 180 tablet 3  ? furosemide (LASIX) 40 MG tablet Patient takes 1 tablet by mouth only on Monday Wednesday and Friday.    ? HYDROcodone-acetaminophen (NORCO/VICODIN) 5-325 MG tablet TAKE ONE TABLET BY MOUTH EVERY 6 HOURS PRN    ? ivabradine ([name]) 5 MG TABS tablet Take 0.5 tablets (2.5 mg total) by mouth 2 (two) times daily. 90 tablet 3  ? levothyroxine (SYNTHROID) 25 MCG tablet Take 25 mcg by mouth daily.    ? montelukast (SINGULAIR) 10 MG tablet Take 10 mg by mouth at bedtime. For allergies & congestion    ? Multiple Vitamin (MULTIVITAMIN) tablet Take 1 tablet by mouth daily.    ? omeprazole (PRILOSEC) 40 MG capsule TAKE ONE CAPSULE BY MOUTH TWICE DAILY 60 capsule 8  ? PARoxetine (PAXIL) 20 MG tablet Take 20 mg by  mouth daily.  1  ? Polyethyl Glycol-Propyl Glycol (SYSTANE OP) Apply 1 drop to eye daily as needed (dry eyes).     ? rosuvastatin (CRESTOR) 5 MG tablet Take 1 tablet (5 mg total) by mouth every evening. Needs follow up appointment for anymore refills 60 tablet 0  ? spironolactone (ALDACTONE) 25 MG tablet TAKE ONE TABLET BY MOUTH daily 90 tablet 3  ? traMADol (ULTRAM) 50 MG tablet Take 50 mg by mouth every 6 (six) hours as needed for pain.    ? ?No current  facility-administered medications for this encounter.  ? ?BP 90/62   Pulse 93   Wt 126.6 kg (279 lb)   SpO2 95%   BMI 51.86 kg/m?  ? ?Wt Readings from Last 3 Encounters:  ?01/31/22 126.6 kg (279 lb)  ?01/06/22 125.2 kg (276 lb)  ?03/24/21 125.9 kg (277 lb 9.6 oz)  ? ?Physical Exam  ?General:  NAD. No resp difficulty ?HEENT: Normal ?Neck: Supple. JVP hard to see. Carotids 2+ bilat; no bruits. No lymphadenopathy or thryomegaly appreciated. ?Cor: PMI nondisplaced. Regular rate & rhythm. No rubs, gallops or murmurs. ?Lungs: Clear ?Abdomen: Obese, soft, nontender, nondistended. No hepatosplenomegaly. No bruits or masses. Good bowel sounds. ?Extremities: No cyanosis, clubbing, rash, edema ?Neuro: Alert & oriented x 3, cranial nerves grossly intact. Moves all 4 extremities w/o difficulty. Affect pleasant. ? ?Device interrogation (personally reviewed): CoreVue recently up but now appears to have leveled off,  > 99% BiV pacing, no VT/VF ? ?ASSESSMENT & PLAN: ?1. Chronic systolic CHF: Nonischemic cardiomyopathy.   ?- Echo (12/17): EF 10-15% s/p St Jude Bi-V ICD with epicardial LV lead.  ?- Echo (2/19): EF 20% stable. ?- CPX in (8/16) was similar to the past: it appeared that the major limitation was from body habitus/deconditioning and not cardiac ?- Echo (3/21): EF 10-15% severe dyssynchrony, RV ok  ?- Underwent AV optimization of CRT in 4/21 QRS 170 -> 172m ?- Echo (9/21): EF 20-25% with dyssynchrony RV ok  ?- Echo (5/22): EF 20-25% RV ok  ?- Stable NYHA II-early III  symptoms, confounded by severe arthritis. Volume ok on device and exam. LSharman Cheeknotifies her of accumulating fluid on device checks. ?- Continue Entresto 49/51 mg bid. Unable to titrate due to low BP. ?

## 2022-01-31 NOTE — Patient Instructions (Signed)
There has been no changes to your medications. ? ?Your physician has requested that you have an echocardiogram. Echocardiography is a painless test that uses sound waves to create images of your heart. It provides your doctor with information about the size and shape of your heart and how well your heart?s chambers and valves are working. This procedure takes approximately one hour. There are no restrictions for this procedure. ? ?Your physician recommends that you schedule a follow-up appointment in: 5 months with Dr. Haroldine Laws and Echocardiogram (September 2023)  ** please call the office in July to arrange your follow up appointment ** ? ?If you have any questions or concerns before your next appointment please send [place] a message through [place] or call our office at [phone].   ? ?TO LEAVE A MESSAGE FOR THE NURSE SELECT OPTION 2, PLEASE LEAVE A MESSAGE INCLUDING: ?YOUR NAME ?DATE OF BIRTH ?CALL BACK NUMBER ?REASON FOR CALL**this is important as we prioritize the call backs ? ?YOU WILL RECEIVE A CALL BACK THE SAME DAY AS LONG AS YOU CALL BEFORE 4:00 PM ? ?At the [facility], you and your health needs are our priority. As part of our continuing mission to provide you with exceptional heart care, we have created designated Provider Care Teams. These Care Teams include your primary Cardiologist (physician) and Advanced Practice Providers (APPs- Physician Assistants and Nurse Practitioners) who all work together to provide you with the care you need, when you need it.  ? ?You may see any of the following providers on your designated Care Team at your next follow up: ?Dr Glori Bickers ?Dr Loralie Champagne ?Darrick Grinder, NP ?Lyda Jester, PA ?Jessica Milford,NP ?Marlyce Huge, PA ?Audry Riles, PharmD ? ? ?Please be sure to bring in all your medications bottles to every appointment.  ? ? ? ?

## 2022-02-15 ENCOUNTER — Other Ambulatory Visit: Payer: Self-pay | Admitting: Cardiology

## 2022-02-28 ENCOUNTER — Ambulatory Visit (INDEPENDENT_AMBULATORY_CARE_PROVIDER_SITE_OTHER): Payer: HMO

## 2022-02-28 DIAGNOSIS — Z95810 Presence of automatic (implantable) cardiac defibrillator: Secondary | ICD-10-CM | POA: Diagnosis not present

## 2022-02-28 DIAGNOSIS — I5022 Chronic systolic (congestive) heart failure: Secondary | ICD-10-CM

## 2022-03-01 NOTE — Progress Notes (Signed)
EPIC Encounter for ICM Monitoring ? ?Patient Name: [name] is a 71 y.o. female ?Date: 03/01/2022 ?Primary Care Physican: [name], MD ?Primary Cardiologist: Bensimhon ?Electrophysiologist: Allred ?Bi-V Pacing: >99%           ?03/01/2022 Weight: 280 lbs  ?  ?       Spoke with patient and heart failure questions reviewed.  Pt reports slight ankle swelling, 1-2 lbs weight gain and feeling heavy.  ?  ?Corvue thoracic impedance normal suggesting possible fluid accumulation starting 4/30. ?  ?Prescribed: Furosemide 40 mg 1 tablet three times a week Monday, Wed and Friday.   Per HF clinic OV note 01/31/22 she can take extra Furosemide as needed. ?  ?Labs: ?01/17/2022 Creatinine 1.21, BUN 13, Potassium 5.0, Sodium 140, GFR 48 ?A complete set of results can be found in Results Review. ?  ?Recommendations: Advised to take extra Furosemide 40 mg this week.  ?  ?Follow-up plan: ICM clinic phone appointment on 03/07/2022 to recheck fluid levels.  91 day device clinic remote transmission 04/13/2022.  ?  ?EP/Cardiology Office Visits:  Recall 08/01/2022 with Dr Haroldine Laws (6 months w/echo).  Recall 01/19/2023 with Dr [name].  ?  ?Copy of ICM check sent to Dr. [name] and Dr Haroldine Laws as Juluis Rainier.  ? ?3 month ICM trend: 02/28/2022. ? ? ? ?12-14 Month ICM trend:  ? ? ? ?[name], RN ?03/01/2022 ?9:57 AM ? ?

## 2022-03-02 ENCOUNTER — Other Ambulatory Visit (HOSPITAL_COMMUNITY): Payer: Self-pay | Admitting: Internal Medicine

## 2022-03-07 ENCOUNTER — Ambulatory Visit: Payer: HMO

## 2022-03-07 DIAGNOSIS — Z95810 Presence of automatic (implantable) cardiac defibrillator: Secondary | ICD-10-CM

## 2022-03-07 DIAGNOSIS — I5022 Chronic systolic (congestive) heart failure: Secondary | ICD-10-CM

## 2022-03-08 NOTE — Progress Notes (Signed)
EPIC Encounter for ICM Monitoring ? ?Patient Name: [name] is a 71 y.o. female ?Date: 03/08/2022 ?Primary Care Physican: [name], MD ?Primary Cardiologist: Bensimhon ?Electrophysiologist: Allred ?Bi-V Pacing: >99%           ?03/01/2022 Weight: 280 lbs  ?03/08/2022 Weight: 278 lbs ?  ?       Spoke with patient and heart failure questions reviewed.  Pt reports ankle swelling resolved and weight decreased by 2 lbs. ?  ?Corvue thoracic impedance suggesting fluid levels return to normal. ?  ?Prescribed: Furosemide 40 mg 1 tablet three times a week Monday, Wed and Friday.   Per HF clinic OV note 01/31/22 she can take extra Furosemide as needed. ?  ?Labs: ?01/17/2022 Creatinine 1.21, BUN 13, Potassium 5.0, Sodium 140, GFR 48 ?A complete set of results can be found in Results Review. ?  ?Recommendations:   No changes and encouraged to call if experiencing any fluid symptoms. ?  ?Follow-up plan: ICM clinic phone appointment on 04/04/2022.  91 day device clinic remote transmission 04/13/2022.  ?  ?EP/Cardiology Office Visits:  Recall 08/01/2022 with Dr Haroldine Laws (6 months w/echo).  Recall 01/19/2023 with Dr [name].  ?  ?Copy of ICM check sent to Dr. [name]. ? ?3 month ICM trend: 03/08/2022. ? ? ? ?12-14 Month ICM trend:  ? ? ? ?[name], RN ?03/08/2022 ?2:23 PM ? ?

## 2022-04-04 ENCOUNTER — Ambulatory Visit (INDEPENDENT_AMBULATORY_CARE_PROVIDER_SITE_OTHER): Payer: HMO

## 2022-04-04 DIAGNOSIS — I5022 Chronic systolic (congestive) heart failure: Secondary | ICD-10-CM

## 2022-04-04 DIAGNOSIS — Z95810 Presence of automatic (implantable) cardiac defibrillator: Secondary | ICD-10-CM | POA: Diagnosis not present

## 2022-04-06 NOTE — Progress Notes (Signed)
EPIC Encounter for ICM Monitoring  Patient Name: STORMI VANDEVELDE is a 71 y.o. female Date: 04/06/2022 Primary Care Physican: [name], MD Primary Cardiologist: [place] Electrophysiologist: Allred Bi-V Pacing: >99%           03/01/2022 Weight: 280 lbs  03/08/2022 Weight: 278 lbs 04/06/2022 Weight: 278 lbs          Spoke with patient and heart failure questions reviewed.  Pt reports chronic mild ankle swelling.   Corvue thoracic impedance suggesting fluid levels return to normal.   Prescribed: Furosemide 40 mg 1 tablet three times a week Monday, Wed and Friday.   Per HF clinic OV note 01/31/22 she can take extra Furosemide as needed.   Labs: 01/17/2022 Creatinine 1.21, BUN 13, Potassium 5.0, Sodium 140, GFR 48 A complete set of results can be found in Results Review.   Recommendations:   No changes and encouraged to call if experiencing any fluid symptoms.   Follow-up plan: ICM clinic phone appointment on 05/09/2022.  91 day device clinic remote transmission 04/13/2022.    EP/Cardiology Office Visits:  Recall 08/01/2022 with Dr Haroldine Laws (6 months w/echo).  Recall 01/19/2023 with Dr [name].    Copy of ICM check sent to Dr. [name].  3 month ICM trend: 04/04/2022.    12-14 Month ICM trend:     [name], RN 04/06/2022 2:15 PM

## 2022-04-13 ENCOUNTER — Ambulatory Visit (INDEPENDENT_AMBULATORY_CARE_PROVIDER_SITE_OTHER): Payer: HMO

## 2022-04-13 DIAGNOSIS — I428 Other cardiomyopathies: Secondary | ICD-10-CM

## 2022-04-15 LAB — CUP PACEART REMOTE DEVICE CHECK
Battery Remaining Longevity: 13 mo
Battery Remaining Percentage: 22 %
Battery Voltage: 2.78 V
Brady Statistic AP VP Percent: 6.7 %
Brady Statistic AP VS Percent: 1 %
Brady Statistic AS VP Percent: 93 %
Brady Statistic AS VS Percent: 1 %
Brady Statistic RA Percent Paced: 6.4 %
Date Time Interrogation Session: 20230614021453
HighPow Impedance: 91 Ohm
HighPow Impedance: 91 Ohm
Implantable Lead Implant Date: 20120914
Implantable Lead Implant Date: 20120914
Implantable Lead Implant Date: 20121116
Implantable Lead Location: 753858
Implantable Lead Location: 753859
Implantable Lead Location: 753860
Implantable Lead Model: 511212
Implantable Lead Serial Number: 194059
Implantable Pulse Generator Implant Date: 20170314
Lead Channel Impedance Value: 260 Ohm
Lead Channel Impedance Value: 400 Ohm
Lead Channel Impedance Value: 410 Ohm
Lead Channel Pacing Threshold Amplitude: 0.75 V
Lead Channel Pacing Threshold Amplitude: 1.25 V
Lead Channel Pacing Threshold Amplitude: 1.5 V
Lead Channel Pacing Threshold Pulse Width: 0.5 ms
Lead Channel Pacing Threshold Pulse Width: 0.5 ms
Lead Channel Pacing Threshold Pulse Width: 0.7 ms
Lead Channel Sensing Intrinsic Amplitude: 0.9 mV
Lead Channel Sensing Intrinsic Amplitude: 12 mV
Lead Channel Setting Pacing Amplitude: 1.75 V
Lead Channel Setting Pacing Amplitude: 2.5 V
Lead Channel Setting Pacing Amplitude: 2.75 V
Lead Channel Setting Pacing Pulse Width: 0.5 ms
Lead Channel Setting Pacing Pulse Width: 0.7 ms
Lead Channel Setting Sensing Sensitivity: 0.5 mV
Pulse Gen Serial Number: 7328650

## 2022-05-09 ENCOUNTER — Ambulatory Visit (INDEPENDENT_AMBULATORY_CARE_PROVIDER_SITE_OTHER): Payer: HMO

## 2022-05-09 DIAGNOSIS — Z95810 Presence of automatic (implantable) cardiac defibrillator: Secondary | ICD-10-CM

## 2022-05-09 DIAGNOSIS — I5022 Chronic systolic (congestive) heart failure: Secondary | ICD-10-CM

## 2022-05-10 NOTE — Progress Notes (Signed)
EPIC Encounter for ICM Monitoring  Patient Name: [name] is a 71 y.o. female Date: 05/10/2022 Primary Care Physican: [name], MD Primary Cardiologist: [place] Electrophysiologist: Allred Bi-V Pacing: >99%           03/08/2022 Weight: 278 lbs 05/10/2022 Weight: 278 lbs          Spoke with patient and heart failure questions reviewed.  Pt reports ankle swelling during decreased impedance.    Corvue thoracic impedance suggesting intermittent days with possible fluid accumulation in the last month.   Prescribed: Furosemide 40 mg 1 tablet three times a week Monday, Wed and Friday.   Per HF clinic OV note 01/31/22 she can take extra Furosemide as needed.   Labs: 01/17/2022 Creatinine 1.21, BUN 13, Potassium 5.0, Sodium 140, GFR 48 A complete set of results can be found in Results Review.   Recommendations:   No changes and encouraged to call if experiencing any fluid symptoms.   Follow-up plan: ICM clinic phone appointment on 06/13/2022.  91 day device clinic remote transmission 07/13/2022.    EP/Cardiology Office Visits:  Recall 08/01/2022 with Dr Haroldine Laws (6 months w/echo).  Recall 01/19/2023 with Dr [name].    Copy of ICM check sent to Dr. [name].  3 month ICM trend: 05/09/2022.    12-14 Month ICM trend:     [name], RN 05/10/2022 2:25 PM

## 2022-06-13 ENCOUNTER — Ambulatory Visit (INDEPENDENT_AMBULATORY_CARE_PROVIDER_SITE_OTHER): Payer: HMO

## 2022-06-13 DIAGNOSIS — Z95810 Presence of automatic (implantable) cardiac defibrillator: Secondary | ICD-10-CM | POA: Diagnosis not present

## 2022-06-13 DIAGNOSIS — I5022 Chronic systolic (congestive) heart failure: Secondary | ICD-10-CM | POA: Diagnosis not present

## 2022-06-15 NOTE — Progress Notes (Signed)
EPIC Encounter for ICM Monitoring  Patient Name: [name] is a 71 y.o. female Date: 06/15/2022 Primary Care Physican: [name], MD Primary Cardiologist: [place] Electrophysiologist: Allred Bi-V Pacing: >99%           03/08/2022 Weight: 278 lbs 05/10/2022 Weight: 278 lbs          Spoke with patient and heart failure questions reviewed.  Pt asymptomatic for fluid accumulation.  Reports feeling well at this time and voices no complaints.    Corvue thoracic impedance suggesting normal fluid levels.    Prescribed: Furosemide 40 mg 1 tablet three times a week Monday, Wed and Friday.   Per HF clinic OV note 01/31/22 she can take extra Furosemide as needed.   Labs: 01/17/2022 Creatinine 1.21, BUN 13, Potassium 5.0, Sodium 140, GFR 48 A complete set of results can be found in Results Review.   Recommendations:   No changes and encouraged to call if experiencing any fluid symptoms.   Follow-up plan: ICM clinic phone appointment on 07/18/2022.  91 day device clinic remote transmission 07/13/2022.    EP/Cardiology Office Visits:  Recall 08/01/2022 with Dr Haroldine Laws (6 months w/echo).  Recall 01/19/2023 with Dr [name].    Copy of ICM check sent to Dr. [name].   3 month ICM trend: 06/13/2022.    12-14 Month ICM trend:     [name], RN 06/15/2022 8:28 AM

## 2022-06-27 ENCOUNTER — Other Ambulatory Visit (HOSPITAL_COMMUNITY): Payer: Self-pay | Admitting: Internal Medicine

## 2022-07-13 ENCOUNTER — Ambulatory Visit (INDEPENDENT_AMBULATORY_CARE_PROVIDER_SITE_OTHER): Payer: HMO

## 2022-07-13 DIAGNOSIS — I428 Other cardiomyopathies: Secondary | ICD-10-CM

## 2022-07-14 LAB — CUP PACEART REMOTE DEVICE CHECK
Battery Remaining Longevity: 11 mo
Battery Remaining Percentage: 18 %
Battery Voltage: 2.75 V
Brady Statistic AP VP Percent: 6.6 %
Brady Statistic AP VS Percent: 1 %
Brady Statistic AS VP Percent: 93 %
Brady Statistic AS VS Percent: 1 %
Brady Statistic RA Percent Paced: 6.3 %
Date Time Interrogation Session: 20230913020018
HighPow Impedance: 98 Ohm
HighPow Impedance: 98 Ohm
Implantable Lead Implant Date: 20120914
Implantable Lead Implant Date: 20120914
Implantable Lead Implant Date: 20121116
Implantable Lead Location: 753858
Implantable Lead Location: 753859
Implantable Lead Location: 753860
Implantable Lead Model: 511212
Implantable Lead Serial Number: 194059
Implantable Pulse Generator Implant Date: 20170314
Lead Channel Impedance Value: 260 Ohm
Lead Channel Impedance Value: 390 Ohm
Lead Channel Impedance Value: 390 Ohm
Lead Channel Pacing Threshold Amplitude: 0.875 V
Lead Channel Pacing Threshold Amplitude: 1.25 V
Lead Channel Pacing Threshold Amplitude: 1.375 V
Lead Channel Pacing Threshold Pulse Width: 0.5 ms
Lead Channel Pacing Threshold Pulse Width: 0.5 ms
Lead Channel Pacing Threshold Pulse Width: 0.7 ms
Lead Channel Sensing Intrinsic Amplitude: 1 mV
Lead Channel Sensing Intrinsic Amplitude: 11.7 mV
Lead Channel Setting Pacing Amplitude: 1.875
Lead Channel Setting Pacing Amplitude: 2.375
Lead Channel Setting Pacing Amplitude: 2.75 V
Lead Channel Setting Pacing Pulse Width: 0.5 ms
Lead Channel Setting Pacing Pulse Width: 0.7 ms
Lead Channel Setting Sensing Sensitivity: 0.5 mV
Pulse Gen Serial Number: 7328650

## 2022-07-18 ENCOUNTER — Ambulatory Visit (INDEPENDENT_AMBULATORY_CARE_PROVIDER_SITE_OTHER): Payer: HMO

## 2022-07-18 DIAGNOSIS — Z95810 Presence of automatic (implantable) cardiac defibrillator: Secondary | ICD-10-CM | POA: Diagnosis not present

## 2022-07-18 DIAGNOSIS — I5022 Chronic systolic (congestive) heart failure: Secondary | ICD-10-CM | POA: Diagnosis not present

## 2022-07-20 NOTE — Progress Notes (Signed)
EPIC Encounter for ICM Monitoring  Patient Name: [name] is a 71 y.o. female Date: 07/20/2022 Primary Care Physican: [name], MD Primary Cardiologist: [place] Electrophysiologist: Curt Bears Bi-V Pacing: >99%           03/08/2022 Weight: 278 lbs 05/10/2022 Weight: 278 lbs 07/20/2022 Weight: 278 lbs          Spoke with patient and heart failure questions reviewed.  Pt reports ankle swelling during decreased impedance.    Corvue thoracic impedance normal but was suggesting possible fluid accumulation 9/5-9/14.   Prescribed: Furosemide 40 mg 1 tablet three times a week Monday, Wed and Friday.   Per HF clinic OV note 01/31/22 she can take extra Furosemide as needed.   Labs: 01/17/2022 Creatinine 1.21, BUN 13, Potassium 5.0, Sodium 140, GFR 48 A complete set of results can be found in Results Review.   Recommendations:   No changes and encouraged to call if experiencing any fluid symptoms.   Follow-up plan: ICM clinic phone appointment on 08/22/2022.  91 day device clinic remote transmission 10/12/2022.    EP/Cardiology Office Visits:  Recall 08/01/2022 with Dr Haroldine Laws (6 months w/echo).  Recall 01/19/2023 with Dr [name].    Copy of ICM check sent to Dr. Curt Bears.  3 month ICM trend: 07/18/2022.    12-14 Month ICM trend:     [name], RN 07/20/2022 4:25 PM

## 2022-07-27 NOTE — Progress Notes (Signed)
Remote ICD transmission.   

## 2022-08-13 ENCOUNTER — Other Ambulatory Visit (HOSPITAL_COMMUNITY): Payer: Self-pay | Admitting: Internal Medicine

## 2022-08-19 ENCOUNTER — Ambulatory Visit (HOSPITAL_COMMUNITY): Admission: RE | Admit: 2022-08-19 | Payer: HMO | Source: Ambulatory Visit

## 2022-08-19 ENCOUNTER — Encounter (HOSPITAL_COMMUNITY): Payer: HMO

## 2022-08-22 ENCOUNTER — Ambulatory Visit (INDEPENDENT_AMBULATORY_CARE_PROVIDER_SITE_OTHER): Payer: HMO

## 2022-08-22 DIAGNOSIS — Z95810 Presence of automatic (implantable) cardiac defibrillator: Secondary | ICD-10-CM

## 2022-08-22 DIAGNOSIS — I5022 Chronic systolic (congestive) heart failure: Secondary | ICD-10-CM | POA: Diagnosis not present

## 2022-08-23 NOTE — Progress Notes (Signed)
EPIC Encounter for ICM Monitoring  Patient Name: [name] is a 71 y.o. female Date: 08/23/2022 Primary Care Physican: [name], MD Primary Cardiologist: [place] Electrophysiologist: Curt Bears Bi-V Pacing: >99%           03/08/2022 Weight: 278 lbs 05/10/2022 Weight: 278 lbs 07/20/2022 Weight: 278 lbs          Spoke with patient and heart failure questions reviewed.  Transmission results reviewed.  Pt asymptomatic for fluid accumulation.  Reports feeling well at this time and voices no complaints.     Corvue thoracic impedance normal but was suggesting intermittent days with possible fluid accumulation within last month.   Prescribed: Furosemide 40 mg 1 tablet three times a week Monday, Wed and Friday.   Per HF clinic OV note 01/31/22 she can take extra Furosemide as needed.   Labs: 01/17/2022 Creatinine 1.21, BUN 13, Potassium 5.0, Sodium 140, GFR 48 A complete set of results can be found in Results Review.   Recommendations:   No changes and encouraged to call if experiencing any fluid symptoms.   Follow-up plan: ICM clinic phone appointment on 09/26/2022.  91 day device clinic remote transmission 10/12/2022.    EP/Cardiology Office Visits:  09/01/2022 with HF clinic.  Recall 01/19/2023 with Dr Curt Bears.    Copy of ICM check sent to Dr. Curt Bears.   3 month ICM trend: 08/22/2022.    12-14 Month ICM trend:     [name], RN 08/23/2022 6:28 AM

## 2022-08-24 ENCOUNTER — Other Ambulatory Visit (HOSPITAL_COMMUNITY): Payer: Self-pay | Admitting: Internal Medicine

## 2022-09-01 ENCOUNTER — Ambulatory Visit (HOSPITAL_COMMUNITY)
Admission: RE | Admit: 2022-09-01 | Discharge: 2022-09-01 | Disposition: A | Discharge status: Home / Self Care | Payer: HMO | Source: Ambulatory Visit | Attending: Adult Health | Admitting: Adult Health

## 2022-09-01 ENCOUNTER — Encounter (HOSPITAL_COMMUNITY): Payer: Self-pay

## 2022-09-01 ENCOUNTER — Ambulatory Visit (HOSPITAL_BASED_OUTPATIENT_CLINIC_OR_DEPARTMENT_OTHER)
Admission: RE | Admit: 2022-09-01 | Discharge: 2022-09-01 | Disposition: A | Discharge status: Home / Self Care | Payer: HMO | Source: Ambulatory Visit | Attending: Family Medicine | Admitting: Family Medicine

## 2022-09-01 VITALS — BP 110/62 | HR 73 | Wt 294.4 lb

## 2022-09-01 DIAGNOSIS — Z853 Personal history of malignant neoplasm of breast: Secondary | ICD-10-CM | POA: Insufficient documentation

## 2022-09-01 DIAGNOSIS — G8929 Other chronic pain: Secondary | ICD-10-CM | POA: Diagnosis not present

## 2022-09-01 DIAGNOSIS — I5022 Chronic systolic (congestive) heart failure: Secondary | ICD-10-CM | POA: Insufficient documentation

## 2022-09-01 DIAGNOSIS — I110 Hypertensive heart disease with heart failure: Secondary | ICD-10-CM | POA: Diagnosis not present

## 2022-09-01 DIAGNOSIS — Z79899 Other long term (current) drug therapy: Secondary | ICD-10-CM | POA: Insufficient documentation

## 2022-09-01 DIAGNOSIS — M179 Osteoarthritis of knee, unspecified: Secondary | ICD-10-CM | POA: Diagnosis not present

## 2022-09-01 DIAGNOSIS — M169 Osteoarthritis of hip, unspecified: Secondary | ICD-10-CM | POA: Diagnosis not present

## 2022-09-01 DIAGNOSIS — G4733 Obstructive sleep apnea (adult) (pediatric): Secondary | ICD-10-CM | POA: Insufficient documentation

## 2022-09-01 DIAGNOSIS — I428 Other cardiomyopathies: Secondary | ICD-10-CM | POA: Insufficient documentation

## 2022-09-01 DIAGNOSIS — Z923 Personal history of irradiation: Secondary | ICD-10-CM | POA: Diagnosis not present

## 2022-09-01 DIAGNOSIS — Z7982 Long term (current) use of aspirin: Secondary | ICD-10-CM | POA: Diagnosis not present

## 2022-09-01 DIAGNOSIS — I447 Left bundle-branch block, unspecified: Secondary | ICD-10-CM | POA: Insufficient documentation

## 2022-09-01 DIAGNOSIS — Z95810 Presence of automatic (implantable) cardiac defibrillator: Secondary | ICD-10-CM | POA: Diagnosis not present

## 2022-09-01 DIAGNOSIS — Z9221 Personal history of antineoplastic chemotherapy: Secondary | ICD-10-CM | POA: Insufficient documentation

## 2022-09-01 DIAGNOSIS — E669 Obesity, unspecified: Secondary | ICD-10-CM | POA: Insufficient documentation

## 2022-09-01 DIAGNOSIS — I480 Paroxysmal atrial fibrillation: Secondary | ICD-10-CM | POA: Insufficient documentation

## 2022-09-01 DIAGNOSIS — E785 Hyperlipidemia, unspecified: Secondary | ICD-10-CM | POA: Insufficient documentation

## 2022-09-01 LAB — ECHOCARDIOGRAM COMPLETE
Area-P 1/2: 2.48 cm2
S' Lateral: 5.6 cm

## 2022-09-01 MED ORDER — PERFLUTREN LIPID MICROSPHERE
1.0000 mL | INTRAVENOUS | Status: DC | PRN
  Administered 2022-09-01: 2 mL via INTRAVENOUS

## 2022-09-01 NOTE — Patient Instructions (Addendum)
Thank you for coming in today  No labs   Your physician recommends that you schedule a follow-up appointment in: 6 months (May 2024), **PLEASE CALL OUR OFFICE IN MARCH TO SCHEDULE THIS APPOINTMENT    Do the following things EVERYDAY: Weigh yourself in the morning before breakfast. Write it down and keep it in a log. Take your medicines as prescribed Eat low salt foods--Limit salt (sodium) to 2000 mg per day.  Stay as active as you can everyday Limit all fluids for the day to less than 2 liters  At the [facility], you and your health needs are our priority. As part of our continuing mission to provide you with exceptional heart care, we have created designated Provider Care Teams. These Care Teams include your primary Cardiologist (physician) and Advanced Practice Providers (APPs- Physician Assistants and Nurse Practitioners) who all work together to provide you with the care you need, when you need it.   You may see any of the following providers on your designated Care Team at your next follow up: Dr Glori Bickers Dr Loralie Champagne Dr. Roxana Hires, NP Lyda Jester, [place] [facility] [place], [place] Forestine Na, NP Audry Riles, PharmD   Please be sure to bring in all your medications bottles to every appointment.   If you have any questions or concerns before your next appointment please send [place] a message through [place] or call our office at [phone].    TO LEAVE A MESSAGE FOR THE NURSE SELECT OPTION 2, PLEASE LEAVE A MESSAGE INCLUDING: YOUR NAME DATE OF BIRTH CALL BACK NUMBER REASON FOR CALL**this is important as we prioritize the call backs  YOU WILL RECEIVE A CALL BACK THE SAME DAY AS LONG AS YOU CALL BEFORE 4:00 PM

## 2022-09-01 NOTE — Progress Notes (Signed)
Advanced Heart Failure Clinic Note  Oncologist: Dr. Philipp Ovens at [facility] EP: Dr. [name] PCP: [name], MD HF: [place]  HPI: [name] is a 71 y.o. woman with a history of HTN, Type B aortic dissection (2010) managed with medical therapy by Dr. Prescott Gum and has healed, LBBB, systolic heart failure due to NICM with EF 10-15% Cath Dec 2011 showed normal arteries.    She was diagnosed with breast cancer in 2009 s/p lumpectomy, XRT, and chemotherapy.  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   She underwent St Jude Bi-V ICD implantation Sept 2012 but unable to place LV lead.  She eventually brought back in Nov 2012 for left thoracotomy and placement of epicardial LV lead placement by Dr. Prescott Gum.  She also had a sleep study that showed mild OSA.  No clinical intervention occurred.    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 Started PPI and famotidine. Question also raised about possible trial off ARNI.   Follow up 5/22 , stable NYHA II-III. Echo showed EF 20-25%, RV ok. Screened for [employer].  Today she returns for HF follow up.Overall feeling ok but has been stressed caring for her Mother. Recently had her Mom placed in a different SNF.  Limited by joint pain. SOB with exertion but this is her baseline. Denies PND/Orthopnea. Appetite ok. No fever or chills. Weight at home has been stable. Taking all medications.  Cardiac Studies: - Echo (5/22): EF 20-25% RV ok  - Echo (9/21): EF 20-25% with dyssynchrony RV ok  - Echo (3/21): EF 10% with severe dyssynchrony. RV ok  - Echo (2/19): EF 20%, RV ok.  - CPX (12/13) Peak VO2: 10.6 % predicted peak VO2: 64.4% VE/VCO2 slope: 26.9 OUES: 1.18 Peak RER: 1.29 Ventilatory Threshold: 7.3 % predicted peak VO2: 44.4% Peak RR 23 Peak Ventilation: 29.8 VE/MVV: 32.6% PETCO2 at peak: 43  - CPX (4/15): Peak VO2: 10.3  (66.4% predicted peak VO2) VE/VCO2 slope: 25.0 OUES: 1.30 Peak RER: 1.20  - CPX (8/16): Peak VO2 10.1 VE/VCO2 slope 28.4 RER 1.14 OUES 1.3 PFTs mildly restrictive No significant change compared to prior.  Limited more by obesity and restrictive lung physiology than heart (mild circulatory limitation).   Echo 01/02/13 EF 20% Grade 1 diastolic dysfunction. RV ok. No PAH Echo 07/10/13 EF 20% RV ok Echo 10/13/14 EF 15-20% RV ok Echo 10/2015 EF 15%  Echo 09/30/16 LVEF 10-15%, Grade 1 DD, mild PI, Mild TR. Normal RV.  Review of systems complete and found to be negative unless listed in HPI.      Past Medical History:  Diagnosis Date   Aortic dissection ([facility]) 12/2008   Type B   Arthritis    lower back   Atrial fibrillation ([place])    post op (left thoracotomy) 11/12 req amio/DCCV   Breast cancer ([place]) 07/2009   Stage 3 lumpectomy, radiation, chemotherapy; finished chemo October, felt to be in remission   Cardiac defibrillator in place    St. Jude (JA) 9/12; LV lead placement unsuccessful;  s/p left thoracotomy with placement of epicardial LV lead 09/2011 (Dr. PVT)   CHF (congestive heart failure) ([facility])    Chronic systolic dysfunction of left ventricle    Depression    DVT (deep venous thrombosis) ([place]) 10/18/2010   GERD (gastroesophageal reflux disease)    Glaucoma    Hiatal hernia    small hiatal  hernia   History of shingles    Hyperlipidemia    Hypertension    Left bundle branch block    Nonischemic cardiomyopathy ([place])    Admission 12/11 with EF 25%, normal coronary arteries by cath 12/11; NICM presumed to be secondary to chemotherapy for breast cancer   Obesity    [name]    with exertion   Current Outpatient Medications  Medication Sig Dispense Refill   ALPRAZolam (XANAX) 1 MG tablet Take 0.5 mg by mouth daily as needed for anxiety. Anxiety      aspirin 81 MG EC tablet Take 81 mg by mouth daily.     azelastine (OPTIVAR) 0.05 % ophthalmic solution Place 1  drop into both eyes daily.     benzonatate (TESSALON) 200 MG capsule Take 200 mg by mouth 3 (three) times daily as needed.     Bepotastine Besilate 1.5 % SOLN Place 1 drop into both eyes daily as needed. Dry eyes      bimatoprost (LUMIGAN) 0.03 % ophthalmic solution Place 1 drop into both eyes at bedtime. Reported on 01/12/2016     Calcium Carbonate-Vitamin D (CALCIUM-VITAMIN D3) 600-200 MG-UNIT TABS Take 1 tablet by mouth 2 (two) times daily.     carvedilol (COREG) 12.5 MG tablet TAKE ONE TABLET BY MOUTH TWICE DAILY 180 tablet 3   cetirizine (ZYRTEC) 10 MG tablet Take 1 tablet by mouth daily.     cholecalciferol (VITAMIN D) 1000 UNITS tablet Take 1,000 Units by mouth 2 (two) times daily.     COLCRYS 0.6 MG tablet TAKE ONE TABLET BY MOUTH EVERY DAY 30 tablet 1   [name] 5 MG TABS tablet TAKE 1/2 TABLET BY MOUTH TWICE DAILY 90 tablet 3   cyclobenzaprine (FLEXERIL) 10 MG tablet Take 1 tablet (10 mg total) by mouth 3 (three) times daily as needed for muscle spasms. 30 tablet 0   diclofenac sodium (VOLTAREN) 1 % GEL APPLY THREE GRAMS TO THREE LARGE JOINTS UP TO THREE TIMES DAILY AS NEEDED 3 Tube 3   ENTRESTO 49-51 MG TAKE ONE TABLET BY MOUTH TWICE DAILY 180 tablet 3   furosemide (LASIX) 40 MG tablet TAKE ONE TABLET BY MOUTH (Patient taking differently: Patient takes 1 tablet by mouth on Monday Wednesday and Friday.) 90 tablet 3   HYDROcodone-acetaminophen (NORCO/VICODIN) 5-325 MG tablet TAKE ONE TABLET BY MOUTH EVERY 6 HOURS PRN     levothyroxine (SYNTHROID) 25 MCG tablet Take 25 mcg by mouth daily.     montelukast (SINGULAIR) 10 MG tablet Take 10 mg by mouth at bedtime. For allergies & congestion     Multiple Vitamin (MULTIVITAMIN) tablet Take 1 tablet by mouth daily.     omeprazole (PRILOSEC) 40 MG capsule TAKE ONE CAPSULE BY MOUTH TWICE DAILY 60 capsule 8   PARoxetine (PAXIL) 20 MG tablet Take 20 mg by mouth daily.  1   Polyethyl Glycol-Propyl Glycol (SYSTANE OP) Apply 1 drop to eye daily as  needed (dry eyes).      rosuvastatin (CRESTOR) 5 MG tablet TAKE ONE TABLET BY MOUTH EVERY EVENING 90 tablet 3   spironolactone (ALDACTONE) 25 MG tablet TAKE ONE TABLET BY MOUTH daily 90 tablet 3   traMADol (ULTRAM) 50 MG tablet Take 50 mg by mouth every 6 (six) hours as needed for pain.     No current facility-administered medications for this encounter.   Facility-Administered Medications Ordered in Other Encounters  Medication Dose Route Frequency Provider Last Rate Last Admin   perflutren lipid microspheres (DEFINITY)  IV suspension  1-10 mL Intravenous PRN Rafael Bihari, FNP   2 mL at 09/01/22 1200   BP 110/62   Pulse 73   Wt 133.5 kg (294 lb 6.4 oz)   SpO2 98%   BMI 54.73 kg/m   Wt Readings from Last 3 Encounters:  09/01/22 133.5 kg (294 lb 6.4 oz)  01/31/22 126.6 kg (279 lb)  01/06/22 125.2 kg (276 lb)   Physical Exam  General:  Well appearing. No resp difficulty HEENT: normal Neck: supple. no JVD. Carotids 2+ bilat; no bruits. No lymphadenopathy or thryomegaly appreciated. Cor: PMI nondisplaced. Regular rate & rhythm. No rubs, gallops or murmurs. Lungs: clear Abdomen: soft, nontender, nondistended. No hepatosplenomegaly. No bruits or masses. Good bowel sounds. Extremities: no cyanosis, clubbing, rash, edema Neuro: alert & orientedx3, cranial nerves grossly intact. moves all 4 extremities w/o difficulty. Affect pleasant  Device interrogation  ASSESSMENT & PLAN:  I reveiwed BMET and CBC form 08/31/22 --> stable.   1. Chronic systolic CHF: Nonischemic cardiomyopathy.   - Echo (12/17): EF 10-15% s/p St Jude Bi-V ICD with epicardial LV lead.  - Echo (2/19): EF 20% stable. - CPX in (8/16) was similar to the past: it appeared that the major limitation was from body habitus/deconditioning and not cardiac - Echo (3/21): EF 10-15% severe dyssynchrony, RV ok  - Underwent AV optimization of CRT in 4/21 QRS 170 -> 123m - Echo (9/21): EF 20-25% with dyssynchrony RV ok  -  Echo (5/22): EF 20-25% RV ok  - Echo today. Results pending. -NYHA III at baseline. Volume status stable.  - Continue Entresto 49/51 mg bid. Unable to titrate due to low BP. - Continue Lasix 40 mg M/W/F.  - Continue Coreg 12.5 mg bid.  - Continue spironolactone 25 mg daily.  - No SGLT2i due to concern for UTI. - Continue [name] 2.5 mg bid  - (no Batwire with CRT-D). - I reviewed BMET from PCP.  2. Paroxysmal atrial fibrillation  - Regular on exam.  - Continue ASA 81 mg daily.  - If recurs, will need antiocoag with CHA2DS2/VAS of 4.  3. Type B aortic dissection  4. Arthritis - Knees and Hips. Not candidate for surgery with severe LV dysfunction.  5.HLD  - Continue Crestor.    Follow up with Dr BVaughan Brownerin 6 months.    ADarrick Grinder NP 09/01/2022

## 2022-09-19 ENCOUNTER — Telehealth: Payer: Self-pay

## 2022-09-19 NOTE — Telephone Encounter (Signed)
Returning call to patient but she reports she did not call.

## 2022-09-26 ENCOUNTER — Ambulatory Visit (INDEPENDENT_AMBULATORY_CARE_PROVIDER_SITE_OTHER): Payer: HMO

## 2022-09-26 DIAGNOSIS — Z95810 Presence of automatic (implantable) cardiac defibrillator: Secondary | ICD-10-CM

## 2022-09-26 DIAGNOSIS — I5022 Chronic systolic (congestive) heart failure: Secondary | ICD-10-CM | POA: Diagnosis not present

## 2022-09-28 NOTE — Progress Notes (Signed)
EPIC Encounter for ICM Monitoring  Patient Name: [name] is a 70 y.o. female Date: 09/28/2022 Primary Care Physican: [name], MD Primary Cardiologist: [place] Electrophysiologist: Curt Bears Bi-V Pacing: >99%           03/08/2022 Weight: 278 lbs 05/10/2022 Weight: 278 lbs 07/20/2022 Weight: 278 lbs 09/28/2022 Weight: 285 lbs  Battery Longevity: 7.6 months         Spoke with patient and heart failure questions reviewed.  Transmission results reviewed.  Pt asymptomatic for fluid accumulation.  Reports feeling well at this time and voices no complaints.  She has had lot stress with taking care of her mother.     Corvue thoracic impedance normal but was suggesting possible fluid accumulation from 10/23-11/8 followed by possible dryness 11/11-11/24.   Prescribed: Furosemide 40 mg 1 tablet three times a week Monday, Wed and Friday.   Per HF clinic OV note 01/31/22 she can take extra Furosemide as needed.   Labs: 08/31/2022 Creatinine 1.09, BUN 15, Potassium 4.1, Sodium 141, GFR 54 Care Everywhere 01/17/2022 Creatinine 1.21, BUN 13, Potassium 5.0, Sodium 140, GFR 48 Care Everywhere A complete set of results can be found in Results Review.   Recommendations:   No changes and encouraged to call if experiencing any fluid symptoms.   Follow-up plan: ICM clinic phone appointment on 11/07/2022.  91 day device clinic remote transmission 10/12/2022.    EP/Cardiology Office Visits:  Recall 02/28/2023 with Dr Haroldine Laws.  Recall 01/19/2023 with Dr Curt Bears.    Copy of ICM check sent to Dr. Curt Bears.  3 month ICM trend: 09/26/2022.    12-14 Month ICM trend:     [name], RN 09/28/2022 7:28 AM

## 2022-10-12 ENCOUNTER — Ambulatory Visit (INDEPENDENT_AMBULATORY_CARE_PROVIDER_SITE_OTHER): Payer: HMO

## 2022-10-12 DIAGNOSIS — I428 Other cardiomyopathies: Secondary | ICD-10-CM

## 2022-10-12 LAB — CUP PACEART REMOTE DEVICE CHECK
Battery Remaining Longevity: 7 mo
Battery Remaining Percentage: 11 %
Battery Voltage: 2.71 V
Brady Statistic AP VP Percent: 7.1 %
Brady Statistic AP VS Percent: 1 %
Brady Statistic AS VP Percent: 92 %
Brady Statistic AS VS Percent: 1 %
Brady Statistic RA Percent Paced: 6.7 %
Date Time Interrogation Session: 20231213020027
HighPow Impedance: 105 Ohm
HighPow Impedance: 105 Ohm
Implantable Lead Connection Status: 753985
Implantable Lead Connection Status: 753985
Implantable Lead Connection Status: 753985
Implantable Lead Implant Date: 20120914
Implantable Lead Implant Date: 20120914
Implantable Lead Implant Date: 20121116
Implantable Lead Location: 753858
Implantable Lead Location: 753859
Implantable Lead Location: 753860
Implantable Lead Model: 511212
Implantable Lead Serial Number: 194059
Implantable Pulse Generator Implant Date: 20170314
Lead Channel Impedance Value: 260 Ohm
Lead Channel Impedance Value: 410 Ohm
Lead Channel Impedance Value: 430 Ohm
Lead Channel Pacing Threshold Amplitude: 0.875 V
Lead Channel Pacing Threshold Amplitude: 1.25 V
Lead Channel Pacing Threshold Amplitude: 1.375 V
Lead Channel Pacing Threshold Pulse Width: 0.5 ms
Lead Channel Pacing Threshold Pulse Width: 0.5 ms
Lead Channel Pacing Threshold Pulse Width: 0.7 ms
Lead Channel Sensing Intrinsic Amplitude: 0.5 mV
Lead Channel Sensing Intrinsic Amplitude: 11.7 mV
Lead Channel Setting Pacing Amplitude: 1.875
Lead Channel Setting Pacing Amplitude: 2.375
Lead Channel Setting Pacing Amplitude: 2.75 V
Lead Channel Setting Pacing Pulse Width: 0.5 ms
Lead Channel Setting Pacing Pulse Width: 0.7 ms
Lead Channel Setting Sensing Sensitivity: 0.5 mV
Pulse Gen Serial Number: 7328650

## 2022-11-07 ENCOUNTER — Ambulatory Visit (INDEPENDENT_AMBULATORY_CARE_PROVIDER_SITE_OTHER): Payer: HMO

## 2022-11-07 DIAGNOSIS — I5022 Chronic systolic (congestive) heart failure: Secondary | ICD-10-CM | POA: Diagnosis not present

## 2022-11-07 DIAGNOSIS — Z95810 Presence of automatic (implantable) cardiac defibrillator: Secondary | ICD-10-CM

## 2022-11-08 NOTE — Progress Notes (Signed)
Remote ICD transmission.   

## 2022-11-09 ENCOUNTER — Telehealth: Payer: Self-pay

## 2022-11-09 NOTE — Progress Notes (Signed)
EPIC Encounter for ICM Monitoring  Patient Name: [name] is a 72 y.o. female Date: 11/09/2022 Primary Care Physican: [name], MD Primary Cardiologist: [facility] Electrophysiologist: Curt Bears Bi-V Pacing: >99%           03/08/2022 Weight: 278 lbs 05/10/2022 Weight: 278 lbs 07/20/2022 Weight: 278 lbs 09/28/2022 Weight: 285 lbs   Battery Longevity: 5.5 months         Attempted call to patient and unable to reach.  Left detailed message per DPR regarding transmission. Transmission reviewed.    Corvue thoracic impedance suggesting possible fluid accumulation starting 11/03/2022 and returned close to baseline normal.  Impedance also suggesting possible fluid accumulation from 12/15-12/25.  Also suggesting possible dryness 12/8-12/15.   Prescribed: Furosemide 40 mg 1 tablet three times a week Monday, Wed and Friday.   Per HF clinic OV note 01/31/22 she can take extra Furosemide as needed.   Labs: 08/31/2022 Creatinine 1.09, BUN 15, Potassium 4.1, Sodium 141, GFR 54 Care Everywhere 01/17/2022 Creatinine 1.21, BUN 13, Potassium 5.0, Sodium 140, GFR 48 Care Everywhere A complete set of results can be found in Results Review.   Recommendations:   Left voice mail with ICM number and encouraged to call if experiencing any fluid symptoms.   Follow-up plan: ICM clinic phone appointment on 12/12/2022.  91 day device clinic remote transmission 3/15/20243.    EP/Cardiology Office Visits:  Recall 02/28/2023 with Dr Haroldine Laws.  Recall 01/19/2023 with Dr Curt Bears.    Copy of ICM check sent to Dr. Curt Bears.  3 month ICM trend: 11/05/2022.    [name], RN 11/09/2022 12:10 PM

## 2022-11-09 NOTE — Telephone Encounter (Signed)
Remote ICM transmission received.  Attempted call to patient regarding ICM remote transmission and left detailed message per DPR.  Advised to return call for any fluid symptoms or questions. Next ICM remote transmission scheduled 12/12/2022.

## 2022-12-11 ENCOUNTER — Other Ambulatory Visit (HOSPITAL_COMMUNITY): Payer: Self-pay | Admitting: Internal Medicine

## 2022-12-12 ENCOUNTER — Ambulatory Visit: Payer: HMO | Attending: Cardiology

## 2022-12-12 DIAGNOSIS — Z95810 Presence of automatic (implantable) cardiac defibrillator: Secondary | ICD-10-CM | POA: Diagnosis not present

## 2022-12-12 DIAGNOSIS — I5022 Chronic systolic (congestive) heart failure: Secondary | ICD-10-CM

## 2022-12-13 ENCOUNTER — Ambulatory Visit: Payer: HMO

## 2022-12-13 DIAGNOSIS — I428 Other cardiomyopathies: Secondary | ICD-10-CM

## 2022-12-13 LAB — CUP PACEART REMOTE DEVICE CHECK
Battery Remaining Longevity: 5 mo
Battery Remaining Percentage: 7 %
Battery Voltage: 2.65 V
Brady Statistic AP VP Percent: 7.3 %
Brady Statistic AP VS Percent: 1 %
Brady Statistic AS VP Percent: 92 %
Brady Statistic AS VS Percent: 1 %
Brady Statistic RA Percent Paced: 6.9 %
Date Time Interrogation Session: 20240212020017
HighPow Impedance: 93 Ohm
HighPow Impedance: 93 Ohm
Implantable Lead Connection Status: 753985
Implantable Lead Connection Status: 753985
Implantable Lead Connection Status: 753985
Implantable Lead Implant Date: 20120914
Implantable Lead Implant Date: 20120914
Implantable Lead Implant Date: 20121116
Implantable Lead Location: 753858
Implantable Lead Location: 753859
Implantable Lead Location: 753860
Implantable Lead Model: 511212
Implantable Lead Serial Number: 194059
Implantable Pulse Generator Implant Date: 20170314
Lead Channel Impedance Value: 260 Ohm
Lead Channel Impedance Value: 390 Ohm
Lead Channel Impedance Value: 390 Ohm
Lead Channel Pacing Threshold Amplitude: 0.875 V
Lead Channel Pacing Threshold Amplitude: 1.25 V
Lead Channel Pacing Threshold Amplitude: 1.375 V
Lead Channel Pacing Threshold Pulse Width: 0.5 ms
Lead Channel Pacing Threshold Pulse Width: 0.5 ms
Lead Channel Pacing Threshold Pulse Width: 0.7 ms
Lead Channel Sensing Intrinsic Amplitude: 0.6 mV
Lead Channel Sensing Intrinsic Amplitude: 11.7 mV
Lead Channel Setting Pacing Amplitude: 1.875
Lead Channel Setting Pacing Amplitude: 2.375
Lead Channel Setting Pacing Amplitude: 2.75 V
Lead Channel Setting Pacing Pulse Width: 0.5 ms
Lead Channel Setting Pacing Pulse Width: 0.7 ms
Lead Channel Setting Sensing Sensitivity: 0.5 mV
Pulse Gen Serial Number: 7328650

## 2022-12-14 NOTE — Progress Notes (Signed)
EPIC Encounter for ICM Monitoring  Patient Name: [name] is a 72 y.o. female Date: 12/14/2022 Primary Care Physican: [name], MD Primary Cardiologist: [place] Electrophysiologist: Curt Bears Bi-V Pacing: >99%           03/08/2022 Weight: 278 lbs 05/10/2022 Weight: 278 lbs 07/20/2022 Weight: 278 lbs 09/28/2022 Weight: 285 lbs 12/14/2022 Weight: 297 lbs   Battery Longevity: 4.7 months         Spoke with patient and heart failure questions reviewed.  Transmission results reviewed.  Pt asymptomatic for fluid accumulation.  Reports feeling well at this time and voices no complaints.     Corvue thoracic impedance suggesting normal fluid levels but was suggesting possible fluid accumulation starting 1/29-2/7.   Prescribed: Furosemide 40 mg 1 tablet three times a week Monday, Wed and Friday.   Per HF clinic OV note 01/31/22 she can take extra Furosemide as needed.   Labs: 08/31/2022 Creatinine 1.09, BUN 15, Potassium 4.1, Sodium 141, GFR 54 Care Everywhere 01/17/2022 Creatinine 1.21, BUN 13, Potassium 5.0, Sodium 140, GFR 48 Care Everywhere A complete set of results can be found in Results Review.   Recommendations: No changes and encouraged to call if experiencing any fluid symptoms.   Follow-up plan: ICM clinic phone appointment on 01/16/2023.  91 day device clinic remote transmission 3/15/20243.    EP/Cardiology Office Visits:  Recall 02/28/2023 with Dr Haroldine Laws.  Recall 01/19/2023 with Dr Curt Bears.    Copy of ICM check sent to Dr. Curt Bears.  3 month ICM trend: 12/12/2022.    12-14 Month ICM trend:     [name], RN 12/14/2022 3:18 PM

## 2023-01-03 ENCOUNTER — Other Ambulatory Visit (HOSPITAL_COMMUNITY): Payer: Self-pay | Admitting: Internal Medicine

## 2023-01-04 ENCOUNTER — Other Ambulatory Visit (HOSPITAL_COMMUNITY): Payer: Self-pay | Admitting: Internal Medicine

## 2023-01-13 ENCOUNTER — Ambulatory Visit: Payer: HMO

## 2023-01-13 DIAGNOSIS — I428 Other cardiomyopathies: Secondary | ICD-10-CM

## 2023-01-13 LAB — CUP PACEART REMOTE DEVICE CHECK
Battery Remaining Longevity: 4 mo
Battery Remaining Percentage: 6 %
Battery Voltage: 2.63 V
Brady Statistic AP VP Percent: 7.5 %
Brady Statistic AP VS Percent: 1 %
Brady Statistic AS VP Percent: 91 %
Brady Statistic AS VS Percent: 1 %
Brady Statistic RA Percent Paced: 7 %
Date Time Interrogation Session: 20240315020022
HighPow Impedance: 110 Ohm
HighPow Impedance: 110 Ohm
Implantable Lead Connection Status: 753985
Implantable Lead Connection Status: 753985
Implantable Lead Connection Status: 753985
Implantable Lead Implant Date: 20120914
Implantable Lead Implant Date: 20120914
Implantable Lead Implant Date: 20121116
Implantable Lead Location: 753858
Implantable Lead Location: 753859
Implantable Lead Location: 753860
Implantable Lead Model: 511212
Implantable Lead Serial Number: 194059
Implantable Pulse Generator Implant Date: 20170314
Lead Channel Impedance Value: 260 Ohm
Lead Channel Impedance Value: 380 Ohm
Lead Channel Impedance Value: 410 Ohm
Lead Channel Pacing Threshold Amplitude: 0.875 V
Lead Channel Pacing Threshold Amplitude: 1.25 V
Lead Channel Pacing Threshold Amplitude: 1.25 V
Lead Channel Pacing Threshold Pulse Width: 0.5 ms
Lead Channel Pacing Threshold Pulse Width: 0.5 ms
Lead Channel Pacing Threshold Pulse Width: 0.7 ms
Lead Channel Sensing Intrinsic Amplitude: 0.6 mV
Lead Channel Sensing Intrinsic Amplitude: 12 mV
Lead Channel Setting Pacing Amplitude: 1.875
Lead Channel Setting Pacing Amplitude: 2.25 V
Lead Channel Setting Pacing Amplitude: 2.75 V
Lead Channel Setting Pacing Pulse Width: 0.5 ms
Lead Channel Setting Pacing Pulse Width: 0.7 ms
Lead Channel Setting Sensing Sensitivity: 0.5 mV
Pulse Gen Serial Number: 7328650

## 2023-01-16 ENCOUNTER — Ambulatory Visit: Payer: HMO | Attending: Cardiology

## 2023-01-16 DIAGNOSIS — I5022 Chronic systolic (congestive) heart failure: Secondary | ICD-10-CM | POA: Diagnosis not present

## 2023-01-16 DIAGNOSIS — Z95810 Presence of automatic (implantable) cardiac defibrillator: Secondary | ICD-10-CM | POA: Diagnosis not present

## 2023-01-16 NOTE — Progress Notes (Signed)
EPIC Encounter for ICM Monitoring  Patient Name: [name] is a 72 y.o. female Date: 01/16/2023 Primary Care Physican: [name], MD Primary Cardiologist: [place] Electrophysiologist: Cyndi Bender Pacing: 99%           09/28/2022 Weight: 285 lbs 12/14/2022 Weight: 297 lbs 01/16/2023 Weight:    Battery Longevity: 3.8 months         Spoke with patient and heart failure questions reviewed.  Transmission results reviewed.  Pt asymptomatic for fluid accumulation but does ache over.  She reports her daughter and son in law have been cooking and there may be ingredients that they use that could be high in salt.     Corvue thoracic impedance suggesting possible fluid accumulation starting 3/11 but trending back toward baseline.   Prescribed: Furosemide 40 mg 1 tablet by mouth daily.   Per HF clinic OV note 01/31/22 she can take extra Furosemide as needed.   Labs: 08/31/2022 Creatinine 1.09, BUN 15, Potassium 4.1, Sodium 141, GFR 54 Care Everywhere 01/17/2022 Creatinine 1.21, BUN 13, Potassium 5.0, Sodium 140, GFR 48 Care Everywhere A complete set of results can be found in Results Review.   Recommendations:   Advised to take Lasix 60 mg daily x 1 day and then return to prescribed dosage.    Follow-up plan: ICM clinic phone appointment on 01/23/2023 to recheck fluid levels.  91 day device clinic remote transmission 04/17/2023.    EP/Cardiology Office Visits:  Recall 02/28/2023 with Dr Haroldine Laws.  Recall 01/19/2023 with Dr Curt Bears.    Copy of ICM check sent to Dr. Curt Bears.  3 month ICM trend: 01/16/2023.    12-14 Month ICM trend:     [name], RN 01/16/2023 10:59 AM

## 2023-01-16 NOTE — Progress Notes (Signed)
Remote ICD transmission.   

## 2023-01-23 ENCOUNTER — Ambulatory Visit: Payer: HMO | Attending: Cardiology

## 2023-01-23 DIAGNOSIS — I5022 Chronic systolic (congestive) heart failure: Secondary | ICD-10-CM

## 2023-01-23 DIAGNOSIS — Z95810 Presence of automatic (implantable) cardiac defibrillator: Secondary | ICD-10-CM

## 2023-01-24 NOTE — Progress Notes (Signed)
EPIC Encounter for ICM Monitoring  Patient Name: [name] is a 72 y.o. female Date: 01/24/2023 Primary Care Physican: [name], MD Primary Cardiologist: [place] Electrophysiologist: Curt Bears Bi-V Pacing: 99%           09/28/2022 Weight: 285 lbs 12/14/2022 Weight: 297 lbs 01/16/2023 Weight: 297 lbs   Battery Longevity: 3.9 months         Spoke with patient and heart failure questions reviewed.  Transmission results reviewed.  Pt asymptomatic for fluid accumulation.       Corvue thoracic impedance suggesting fluid levels returned to normal after taking extra 20 mg Lasix x 1 day.   Prescribed: Furosemide 40 mg 1 tablet by mouth daily.   Per HF clinic OV note 01/31/22 she can take extra Furosemide as needed.   Labs: 08/31/2022 Creatinine 1.09, BUN 15, Potassium 4.1, Sodium 141, GFR 54 Care Everywhere 01/17/2022 Creatinine 1.21, BUN 13, Potassium 5.0, Sodium 140, GFR 48 Care Everywhere A complete set of results can be found in Results Review.   Recommendations:   No changes and encouraged to call if experiencing any fluid symptoms.   Follow-up plan: ICM clinic phone appointment on 02/20/2023.  91 day device clinic remote transmission 04/17/2023.    EP/Cardiology Office Visits:  Advised to call offices of Dr Haroldine Laws and Dr Curt Bears for office appointments.  Recall 02/28/2023 with Dr Haroldine Laws.  Recall 01/19/2023 with Dr Curt Bears.    Copy of ICM check sent to Dr. Curt Bears.  3 month ICM trend: 01/23/2023.    12-14 Month ICM trend:     [name], RN 01/24/2023 2:56 PM

## 2023-02-13 ENCOUNTER — Ambulatory Visit (INDEPENDENT_AMBULATORY_CARE_PROVIDER_SITE_OTHER): Payer: HMO

## 2023-02-13 DIAGNOSIS — I5022 Chronic systolic (congestive) heart failure: Secondary | ICD-10-CM

## 2023-02-13 DIAGNOSIS — I428 Other cardiomyopathies: Secondary | ICD-10-CM

## 2023-02-13 LAB — CUP PACEART REMOTE DEVICE CHECK
Battery Remaining Longevity: 3 mo
Battery Remaining Percentage: 4 %
Battery Voltage: 2.63 V
Brady Statistic AP VP Percent: 7.6 %
Brady Statistic AP VS Percent: 1 %
Brady Statistic AS VP Percent: 91 %
Brady Statistic AS VS Percent: 1 %
Brady Statistic RA Percent Paced: 7 %
Date Time Interrogation Session: 20240415020026
HighPow Impedance: 105 Ohm
HighPow Impedance: 105 Ohm
Implantable Lead Connection Status: 753985
Implantable Lead Connection Status: 753985
Implantable Lead Connection Status: 753985
Implantable Lead Implant Date: 20120914
Implantable Lead Implant Date: 20120914
Implantable Lead Implant Date: 20121116
Implantable Lead Location: 753858
Implantable Lead Location: 753859
Implantable Lead Location: 753860
Implantable Lead Model: 511212
Implantable Lead Serial Number: 194059
Implantable Pulse Generator Implant Date: 20170314
Lead Channel Impedance Value: 260 Ohm
Lead Channel Impedance Value: 390 Ohm
Lead Channel Impedance Value: 390 Ohm
Lead Channel Pacing Threshold Amplitude: 1 V
Lead Channel Pacing Threshold Amplitude: 1.25 V
Lead Channel Pacing Threshold Amplitude: 1.25 V
Lead Channel Pacing Threshold Pulse Width: 0.5 ms
Lead Channel Pacing Threshold Pulse Width: 0.5 ms
Lead Channel Pacing Threshold Pulse Width: 0.7 ms
Lead Channel Sensing Intrinsic Amplitude: 0.8 mV
Lead Channel Sensing Intrinsic Amplitude: 11.7 mV
Lead Channel Setting Pacing Amplitude: 2 V
Lead Channel Setting Pacing Amplitude: 2.25 V
Lead Channel Setting Pacing Amplitude: 2.75 V
Lead Channel Setting Pacing Pulse Width: 0.5 ms
Lead Channel Setting Pacing Pulse Width: 0.7 ms
Lead Channel Setting Sensing Sensitivity: 0.5 mV
Pulse Gen Serial Number: 7328650

## 2023-02-13 NOTE — Progress Notes (Signed)
Remote ICD transmission.   

## 2023-02-20 ENCOUNTER — Ambulatory Visit: Payer: HMO | Attending: Cardiology

## 2023-02-20 DIAGNOSIS — Z95810 Presence of automatic (implantable) cardiac defibrillator: Secondary | ICD-10-CM

## 2023-02-20 DIAGNOSIS — I5022 Chronic systolic (congestive) heart failure: Secondary | ICD-10-CM

## 2023-02-22 NOTE — Progress Notes (Signed)
EPIC Encounter for ICM Monitoring  Patient Name: [name] is a 72 y.o. female Date: 02/22/2023 Primary Care Physican: Gordan Payment., MD Primary Cardiologist: [facility] Electrophysiologist: Elberta Fortis Bi-V Pacing: 99%           09/28/2022 Weight: 285 lbs 12/14/2022 Weight: 297 lbs 01/16/2023 Weight: 297 lbs   Battery Longevity: 2.9 months         Attempted call to patient and unable to reach.  Left detailed message per DPR regarding transmission. Transmission reviewed.        Corvue thoracic impedance normal but was suggesting possible fluid accumulation from 4/12-4/17.   Prescribed: Furosemide 40 mg 1 tablet by mouth daily.   Per HF clinic OV note 01/31/22 she can take extra Furosemide as needed.   Labs: 08/31/2022 Creatinine 1.09, BUN 15, Potassium 4.1, Sodium 141, GFR 54 Care Everywhere 01/17/2022 Creatinine 1.21, BUN 13, Potassium 5.0, Sodium 140, GFR 48 Care Everywhere A complete set of results can be found in Results Review.   Recommendations:   Left voice mail with ICM number and encouraged to call if experiencing any fluid symptoms.   Follow-up plan: ICM clinic phone appointment on 03/28/2023.  91 day device clinic remote transmission 04/17/2023.    EP/Cardiology Office Visits:  Pt aware to call Dr Gala Romney and Dr Elberta Fortis office for appointments.  Recall 02/28/2023 with Dr Gala Romney.  Recall 01/19/2023 with Dr Elberta Fortis.    Copy of ICM check sent to Dr. Elberta Fortis.  3 month ICM trend: 02/20/2023.    12-14 Month ICM trend:     Karie Soda, RN 02/22/2023 10:25 AM

## 2023-02-24 NOTE — Progress Notes (Signed)
Spoke with patient and heart failure questions reviewed.  Transmission results reviewed.  Pt asymptomatic for fluid accumulation.  Reports feeling well at this time and voices no complaints.  Discussed battery replacement.  Provided EP scheduler and advised to call for appointment with Dr Elberta Fortis.

## 2023-03-16 ENCOUNTER — Ambulatory Visit (INDEPENDENT_AMBULATORY_CARE_PROVIDER_SITE_OTHER): Payer: HMO

## 2023-03-16 DIAGNOSIS — I428 Other cardiomyopathies: Secondary | ICD-10-CM

## 2023-03-16 LAB — CUP PACEART REMOTE DEVICE CHECK
Battery Remaining Longevity: 1 mo
Battery Remaining Percentage: 3 %
Battery Voltage: 2.6 V
Brady Statistic AP VP Percent: 7.6 %
Brady Statistic AP VS Percent: 1 %
Brady Statistic AS VP Percent: 91 %
Brady Statistic AS VS Percent: 1 %
Brady Statistic RA Percent Paced: 7 %
Date Time Interrogation Session: 20240516020018
HighPow Impedance: 95 Ohm
HighPow Impedance: 95 Ohm
Implantable Lead Connection Status: 753985
Implantable Lead Connection Status: 753985
Implantable Lead Connection Status: 753985
Implantable Lead Implant Date: 20120914
Implantable Lead Implant Date: 20120914
Implantable Lead Implant Date: 20121116
Implantable Lead Location: 753858
Implantable Lead Location: 753859
Implantable Lead Location: 753860
Implantable Lead Model: 511212
Implantable Lead Serial Number: 194059
Implantable Pulse Generator Implant Date: 20170314
Lead Channel Impedance Value: 260 Ohm
Lead Channel Impedance Value: 400 Ohm
Lead Channel Impedance Value: 430 Ohm
Lead Channel Pacing Threshold Amplitude: 0.875 V
Lead Channel Pacing Threshold Amplitude: 1.125 V
Lead Channel Pacing Threshold Amplitude: 1.25 V
Lead Channel Pacing Threshold Pulse Width: 0.5 ms
Lead Channel Pacing Threshold Pulse Width: 0.5 ms
Lead Channel Pacing Threshold Pulse Width: 0.7 ms
Lead Channel Sensing Intrinsic Amplitude: 1 mV
Lead Channel Sensing Intrinsic Amplitude: 5 mV
Lead Channel Setting Pacing Amplitude: 1.875
Lead Channel Setting Pacing Amplitude: 2.125
Lead Channel Setting Pacing Amplitude: 2.75 V
Lead Channel Setting Pacing Pulse Width: 0.5 ms
Lead Channel Setting Pacing Pulse Width: 0.7 ms
Lead Channel Setting Sensing Sensitivity: 0.5 mV
Pulse Gen Serial Number: 7328650

## 2023-03-17 NOTE — Progress Notes (Signed)
Remote ICD transmission.   

## 2023-03-28 ENCOUNTER — Ambulatory Visit: Payer: HMO | Attending: Cardiology

## 2023-03-28 DIAGNOSIS — I5022 Chronic systolic (congestive) heart failure: Secondary | ICD-10-CM | POA: Diagnosis not present

## 2023-03-28 DIAGNOSIS — Z95810 Presence of automatic (implantable) cardiac defibrillator: Secondary | ICD-10-CM | POA: Diagnosis not present

## 2023-03-30 NOTE — Progress Notes (Signed)
Remote ICD transmission.   

## 2023-03-30 NOTE — Progress Notes (Signed)
EPIC Encounter for ICM Monitoring  Patient Name: [name] is a 72 y.o. female Date: 03/30/2023 Primary Care Physican: Gordan Payment., MD Primary Cardiologist: [facility] Electrophysiologist: Kathreen Cornfield Pacing: 99%           09/28/2022 Weight: 285 lbs 12/14/2022 Weight: 297 lbs 01/16/2023 Weight: 297 lbs   Battery Longevity: <3 months         Spoke with patient and heart failure questions reviewed.  Transmission results reviewed.  Pt asymptomatic for fluid accumulation.  Reports feeling well at this time and voices no complaints.        Corvue thoracic impedance normal but was suggesting possible fluid accumulation from 5/14-5/16.   Prescribed: Furosemide 40 mg 1 tablet by mouth daily.   Per HF clinic OV note 01/31/22 she can take extra Furosemide as needed.   Labs: 08/31/2022 Creatinine 1.09, BUN 15, Potassium 4.1, Sodium 141, GFR 54 Care Everywhere 01/17/2022 Creatinine 1.21, BUN 13, Potassium 5.0, Sodium 140, GFR 48 Care Everywhere A complete set of results can be found in Results Review.   Recommendations:  No changes and encouraged to call if experiencing any fluid symptoms.   Follow-up plan: ICM clinic phone appointment on 05/01/2023.  91 day device clinic remote transmission 04/17/2023.    EP/Cardiology Office Visits:   04/21/2023 with Dr Gala Romney.  Provided EP schedule number on 4/22 to call for appointment.  Recall 01/19/2023 with Dr Elberta Fortis.    Copy of ICM check sent to Dr. Elberta Fortis.  3 month ICM trend: 03/28/2023.    12-14 Month ICM trend:     Karie Soda, RN 03/30/2023 1:41 PM

## 2023-04-10 ENCOUNTER — Telehealth: Payer: Self-pay

## 2023-04-10 NOTE — Telephone Encounter (Signed)
ICD reached ERI 04/09/23.   Patient needs apt with EP provider to discuss gen change. Patient called, made aware. Appreciative of call.

## 2023-04-11 NOTE — Telephone Encounter (Signed)
Pt is scheduled for a BiV-ICD Gen Change with Dr.Camnitz on 05/22/23 @ 10:30 AM.  She is scheduled with Dr. Elberta Fortis for an H&P on 6/24 @ 4:15 (ok per Roanna Raider)  We will obtain labs that day, give scrub and go over instructions.

## 2023-04-17 ENCOUNTER — Ambulatory Visit: Payer: HMO

## 2023-04-18 ENCOUNTER — Telehealth (HOSPITAL_COMMUNITY): Payer: Self-pay | Admitting: Vascular Surgery

## 2023-04-18 NOTE — Telephone Encounter (Signed)
Lvm to move 6/21 appt w/ db Tues 6/25 afternoon or Wed in am 6/26

## 2023-04-21 ENCOUNTER — Encounter (HOSPITAL_COMMUNITY): Payer: HMO | Admitting: Internal Medicine

## 2023-04-24 ENCOUNTER — Encounter: Payer: Self-pay | Admitting: Cardiology

## 2023-04-24 ENCOUNTER — Ambulatory Visit: Payer: PPO | Attending: Cardiology | Admitting: Cardiology

## 2023-04-24 VITALS — BP 102/68 | HR 72 | Ht 61.5 in | Wt 281.6 lb

## 2023-04-24 DIAGNOSIS — Z95810 Presence of automatic (implantable) cardiac defibrillator: Secondary | ICD-10-CM

## 2023-04-24 DIAGNOSIS — I480 Paroxysmal atrial fibrillation: Secondary | ICD-10-CM

## 2023-04-24 DIAGNOSIS — I5022 Chronic systolic (congestive) heart failure: Secondary | ICD-10-CM

## 2023-04-24 NOTE — H&P (View-Only) (Signed)
  Electrophysiology Office Note:   Date:  04/24/2023  ID:  [name], DOB 1951/10/29, MRN [id number]  Primary Cardiologist: None Electrophysiologist: Will Jorja Loa, MD      History of Present Illness:   [name] is a 72 y.o. female with h/o CHF seen today for routine electrophysiology followup.  Since last being seen in our clinic the patient reports doing well.  She has no acute complaints today.  She comes in a wheelchair, but has been able to do her daily activities.  she denies chest pain, palpitations, dyspnea, PND, orthopnea, nausea, vomiting, dizziness, syncope, edema, weight gain, or early satiety.     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  She is status post [facility] Jude CRT-D.  She was unable to have a coronary sinus lead placed and she is now post epicardial lead.     Review of systems complete and found to be negative unless listed in HPI.   Device History: Abbott BiV ICD implanted 2012 for CHF History of appropriate therapy: No History of AAD therapy: No    Studies Reviewed:    ICD Interrogation-  reviewed in detail today,  See PACEART report.  EKG is ordered today. Personal review as below.  EKG Interpretation  Date/Time:  Monday April 24 2023 16:31:32 EDT Ventricular Rate:  72 PR Interval:  200 QRS Duration: 166 QT Interval:  458 QTC Calculation: 501 R Axis:   118 Text Interpretation: Atrial-sensed ventricular-paced rhythm with occasional Premature ventricular complexes When compared with ECG of 24-Mar-2021 11:11, No significant change was found Confirmed by Camnitz, Will ([id number]) on 04/24/2023 4:45:08 PM    Risk Assessment/Calculations:              Physical Exam:   VS:  BP 102/68   Pulse 72   Ht 5' 1.5" (1.562 m)   Wt 281 lb 9.6 oz (127.7 kg)   SpO2  95%   BMI 52.35 kg/m    Wt Readings from Last 3 Encounters:  04/24/23 281 lb 9.6 oz (127.7 kg)  09/01/22 294 lb 6.4 oz (133.5 kg)  01/31/22 279 lb (126.6 kg)     GEN: Well nourished, well developed in no acute distress NECK: No JVD; No carotid bruits CARDIAC: Regular rate and rhythm, no murmurs, rubs, gallops RESPIRATORY:  Clear to auscultation without rales, wheezing or rhonchi  ABDOMEN: Soft, non-tender, non-distended EXTREMITIES:  No edema; No deformity   ASSESSMENT AND PLAN:    1.  Chronic systolic dysfunction s/p Abbott CRT-D  euvolemic today Stable on an appropriate medical regimen Normal ICD function See Pace Art report No changes today Device is at ERI.  Will plan for generator change.  Risk and benefits have been discussed.  Risk of bleeding and infection.  She understands the risks and is agreed to the procedure.  2.  Paroxysmal atrial fibrillation: 1 event noted in 2012 was postop.  No anticoagulation.  No evidence of A-fib on device interrogation.  3.  Type B aortic dissection: Treated medically per primary cardiology  Disposition:   Follow up with Dr. Elberta Fortis as usual post procedure   Signed, Will Jorja Loa, MD

## 2023-04-24 NOTE — Patient Instructions (Signed)
Medication Instructions:  Your physician recommends that you continue on your current medications as directed. Please refer to the Current Medication list given to you today.  *If you need a refill on your cardiac medications before your next appointment, please call your pharmacy*   Lab Work: None ordered If you have labs (blood work) drawn today and your tests are completely normal, you will receive your results only by: MyChart Message (if you have MyChart) OR A paper copy in the mail If you have any lab test that is abnormal or we need to change your treatment, we will call you to review the results.   Testing/Procedures: None ordered   Follow-Up: At [facility], you and your health needs are our priority.  As part of our continuing mission to provide you with exceptional heart care, we have created designated Provider Care Teams.  These Care Teams include your primary Cardiologist (physician) and Advanced Practice Providers (APPs -  Physician Assistants and Nurse Practitioners) who all work together to provide you with the care you need, when you need it.  You are scheduled for a ICD battery change    Thank you for choosing [facility]!!   Dory Horn, RN [phone]

## 2023-04-24 NOTE — Progress Notes (Signed)
  Electrophysiology Office Note:   Date:  04/24/2023  ID:  [name], DOB 1951/10/29, MRN [id number]  Primary Cardiologist: None Electrophysiologist: Kaelyn Innocent Jorja Loa, MD      History of Present Illness:   [name] is a 72 y.o. female with h/o CHF seen today for routine electrophysiology followup.  Since last being seen in our clinic the patient reports doing well.  She has no acute complaints today.  She comes in a wheelchair, but has been able to do her daily activities.  she denies chest pain, palpitations, dyspnea, PND, orthopnea, nausea, vomiting, dizziness, syncope, edema, weight gain, or early satiety.     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  She is status post [facility] [name] CRT-D.  She was unable to have a coronary sinus lead placed and she is now post epicardial lead.     Review of systems complete and found to be negative unless listed in HPI.   Device History: Abbott BiV ICD implanted 2012 for CHF History of appropriate therapy: No History of AAD therapy: No    Studies Reviewed:    ICD Interrogation-  reviewed in detail today,  See PACEART report.  EKG is ordered today. Personal review as below.  EKG Interpretation  Date/Time:  Monday April 24 2023 16:31:32 EDT Ventricular Rate:  72 PR Interval:  200 QRS Duration: 166 QT Interval:  458 QTC Calculation: 501 R Axis:   118 Text Interpretation: Atrial-sensed ventricular-paced rhythm with occasional Premature ventricular complexes When compared with ECG of 24-Mar-2021 11:11, No significant change was found Confirmed by [name] ([id number]) on 04/24/2023 4:45:08 PM    Risk Assessment/Calculations:              Physical Exam:   VS:  BP 102/68   Pulse 72   Ht 5' 1.5" (1.562 m)   Wt 281 lb 9.6 oz (127.7 kg)   SpO2  95%   BMI 52.35 kg/m    Wt Readings from Last 3 Encounters:  04/24/23 281 lb 9.6 oz (127.7 kg)  09/01/22 294 lb 6.4 oz (133.5 kg)  01/31/22 279 lb (126.6 kg)     GEN: Well nourished, well developed in no acute distress NECK: No JVD; No carotid bruits CARDIAC: Regular rate and rhythm, no murmurs, rubs, gallops RESPIRATORY:  Clear to auscultation without rales, wheezing or rhonchi  ABDOMEN: Soft, non-tender, non-distended EXTREMITIES:  No edema; No deformity   ASSESSMENT AND PLAN:    1.  Chronic systolic dysfunction s/p Abbott CRT-D  euvolemic today Stable on an appropriate medical regimen Normal ICD function See Pace Art report No changes today Device is at ERI.  Damar Petit plan for generator change.  Risk and benefits have been discussed.  Risk of bleeding and infection.  She understands the risks and is agreed to the procedure.  2.  Paroxysmal atrial fibrillation: 1 event noted in 2012 was postop.  No anticoagulation.  No evidence of A-fib on device interrogation.  3.  Type B aortic dissection: Treated medically per primary cardiology  Disposition:   Follow up with Dr. Elberta Fortis as usual post procedure   Signed, [name] Jorja Loa, MD

## 2023-04-25 ENCOUNTER — Encounter (HOSPITAL_COMMUNITY): Payer: Self-pay | Admitting: Internal Medicine

## 2023-04-25 ENCOUNTER — Ambulatory Visit (HOSPITAL_COMMUNITY)
Admission: RE | Admit: 2023-04-25 | Discharge: 2023-04-25 | Disposition: A | Discharge status: Home / Self Care | Payer: PPO | Source: Ambulatory Visit | Attending: Internal Medicine | Admitting: Internal Medicine

## 2023-04-25 VITALS — BP 88/60 | HR 76 | Wt 282.6 lb

## 2023-04-25 DIAGNOSIS — Z79899 Other long term (current) drug therapy: Secondary | ICD-10-CM | POA: Diagnosis not present

## 2023-04-25 DIAGNOSIS — R0602 Shortness of breath: Secondary | ICD-10-CM | POA: Insufficient documentation

## 2023-04-25 DIAGNOSIS — G4733 Obstructive sleep apnea (adult) (pediatric): Secondary | ICD-10-CM | POA: Diagnosis not present

## 2023-04-25 DIAGNOSIS — Z7982 Long term (current) use of aspirin: Secondary | ICD-10-CM | POA: Diagnosis not present

## 2023-04-25 DIAGNOSIS — I7100 Dissection of unspecified site of aorta: Secondary | ICD-10-CM | POA: Insufficient documentation

## 2023-04-25 DIAGNOSIS — Z6841 Body Mass Index (BMI) 40.0 and over, adult: Secondary | ICD-10-CM | POA: Diagnosis not present

## 2023-04-25 DIAGNOSIS — I5022 Chronic systolic (congestive) heart failure: Secondary | ICD-10-CM | POA: Insufficient documentation

## 2023-04-25 DIAGNOSIS — I428 Other cardiomyopathies: Secondary | ICD-10-CM | POA: Insufficient documentation

## 2023-04-25 DIAGNOSIS — Z95810 Presence of automatic (implantable) cardiac defibrillator: Secondary | ICD-10-CM

## 2023-04-25 DIAGNOSIS — I480 Paroxysmal atrial fibrillation: Secondary | ICD-10-CM | POA: Diagnosis not present

## 2023-04-25 DIAGNOSIS — I110 Hypertensive heart disease with heart failure: Secondary | ICD-10-CM | POA: Insufficient documentation

## 2023-04-25 DIAGNOSIS — Z853 Personal history of malignant neoplasm of breast: Secondary | ICD-10-CM | POA: Insufficient documentation

## 2023-04-25 LAB — COMPREHENSIVE METABOLIC PANEL
ALT: 16 U/L (ref 0–44)
AST: 21 U/L (ref 15–41)
Albumin: 3 g/dL — ABNORMAL LOW (ref 3.5–5.0)
Alkaline Phosphatase: 89 U/L (ref 38–126)
Anion gap: 9 (ref 5–15)
BUN: 25 mg/dL — ABNORMAL HIGH (ref 8–23)
CO2: 29 mmol/L (ref 22–32)
Calcium: 9.2 mg/dL (ref 8.9–10.3)
Chloride: 98 mmol/L (ref 98–111)
Creatinine, Ser: 1.29 mg/dL — ABNORMAL HIGH (ref 0.44–1.00)
GFR, Estimated: 44 mL/min — ABNORMAL LOW (ref >60)
Glucose, Bld: 109 mg/dL — ABNORMAL HIGH (ref 70–99)
Potassium: 3.8 mmol/L (ref 3.5–5.1)
Sodium: 136 mmol/L (ref 135–145)
Total Bilirubin: 0.5 mg/dL (ref 0.3–1.2)
Total Protein: 7.1 g/dL (ref 6.5–8.1)

## 2023-04-25 LAB — PROTIME-INR
INR: 1 (ref 0.8–1.2)
Prothrombin Time: 13.6 seconds (ref 11.4–15.2)

## 2023-04-25 LAB — CBC
HCT: 40.5 % (ref 36.0–46.0)
Hemoglobin: 13.5 g/dL (ref 12.0–15.0)
MCH: 32.1 pg (ref 26.0–34.0)
MCHC: 33.3 g/dL (ref 30.0–36.0)
MCV: 96.4 fL (ref 80.0–100.0)
Platelets: 136 10*3/uL — ABNORMAL LOW (ref 150–400)
RBC: 4.2 MIL/uL (ref 3.87–5.11)
RDW: 14.7 % (ref 11.5–15.5)
WBC: 8.1 10*3/uL (ref 4.0–10.5)
nRBC: 0 % (ref 0.0–0.2)

## 2023-04-25 LAB — BRAIN NATRIURETIC PEPTIDE: B Natriuretic Peptide: 114.3 pg/mL — ABNORMAL HIGH (ref 0.0–100.0)

## 2023-04-25 LAB — T4, FREE: Free T4: 0.94 ng/dL (ref 0.61–1.12)

## 2023-04-25 LAB — TSH: TSH: 2.145 u[IU]/mL (ref 0.350–4.500)

## 2023-04-25 MED ORDER — CARVEDILOL 6.25 MG PO TABS: 6.2500 mg | ORAL_TABLET | Freq: Two times a day (BID) | ORAL | 3 refills | Status: DC

## 2023-04-25 MED ORDER — ENTRESTO 24-26 MG PO TABS: 1.0000 | ORAL_TABLET | Freq: Two times a day (BID) | ORAL | 11 refills | Status: DC

## 2023-04-25 NOTE — Patient Instructions (Signed)
DECREASE Carvedilol to 6.25 mg Twice daily  DECREASE Entresto to 24/26 mg Twice daily  Labs done today, your results will be available in MyChart, we will contact you for abnormal readings.  Your physician recommends that you schedule a follow-up appointment in: 3 months with the nurse practitioner and 6 months with Dr. Gala Romney.  If you have any questions or concerns before your next appointment please send [place] a message through [place] or call our office at [phone].    TO LEAVE A MESSAGE FOR THE NURSE SELECT OPTION 2, PLEASE LEAVE A MESSAGE INCLUDING: YOUR NAME DATE OF BIRTH CALL BACK NUMBER REASON FOR CALL**this is important as we prioritize the call backs  YOU WILL RECEIVE A CALL BACK THE SAME DAY AS LONG AS YOU CALL BEFORE 4:00 PM  At the [facility], you and your health needs are our priority. As part of our continuing mission to provide you with exceptional heart care, we have created designated Provider Care Teams. These Care Teams include your primary Cardiologist (physician) and Advanced Practice Providers (APPs- Physician Assistants and Nurse Practitioners) who all work together to provide you with the care you need, when you need it.   You may see any of the following providers on your designated Care Team at your next follow up: Dr Arvilla Meres Dr Marca Ancona Dr. Marcos Eke, NP Robbie Lis, [place] [facility] [place], [place] Brynda Peon, NP Karle Plumber, PharmD   Please be sure to bring in all your medications bottles to every appointment.    Thank you for choosing [facility]

## 2023-04-25 NOTE — Progress Notes (Signed)
Advanced Heart Failure Clinic Note  Oncologist: Dr. Amie Critchley at [facility] EP: Dr. Johney Frame PCP: Gordan Payment., MD HF: Azlan Hanway  HPI: Ms. Gillyard is a 72 y.o. woman with a history of HTN, Type B aortic dissection (2010) managed with medical therapy by Dr. Donata Clay and has healed, LBBB, systolic heart failure due to NICM with EF 10-15% Cath Dec 2011 showed normal arteries.    She was diagnosed with breast cancer in 2009 s/p lumpectomy, XRT, and chemotherapy.  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   She underwent St Jude Bi-V ICD implantation Sept 2012 but unable to place LV lead.  She eventually brought back in Nov 2012 for left thoracotomy and placement of epicardial LV lead placement by Dr. Donata Clay.  She also had a sleep study that showed mild OSA.  No clinical intervention occurred.    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 Started PPI and famotidine. Question also raised about possible trial off ARNI.   Follow up 5/22 , stable NYHA II-III. Echo  EF 20-25%, RV ok. Screened for [employer].  Echo 11/23 EF 20-25% RV ok Personally reviewed  Here for HF f/u. Says she is doing ok. Very fatigued. Struggles with ADLs. Her mother passed in 2/24 at age 71. SOB with any activity. No CP. BP remains soft. Tolerates ok. Pending pacer gen change with Dr, Linde Gillis.  Cardiac Studies: - Echo (5/22): EF 20-25% RV ok  - Echo (9/21): EF 20-25% with dyssynchrony RV ok  - Echo (3/21): EF 10% with severe dyssynchrony. RV ok  - Echo (2/19): EF 20%, RV ok.  - CPX (12/13) Peak VO2: 10.6 % predicted peak VO2: 64.4% VE/VCO2 slope: 26.9 OUES: 1.18 Peak RER: 1.29 Ventilatory Threshold: 7.3 % predicted peak VO2: 44.4% Peak RR 23 Peak Ventilation: 29.8 VE/MVV: 32.6% PETCO2 at peak: 43  - CPX (4/15): Peak VO2: 10.3 (66.4% predicted peak VO2) VE/VCO2 slope: 25.0 OUES: 1.30 Peak  RER: 1.20  - CPX (8/16): Peak VO2 10.1 VE/VCO2 slope 28.4 RER 1.14 OUES 1.3 PFTs mildly restrictive No significant change compared to prior.  Limited more by obesity and restrictive lung physiology than heart (mild circulatory limitation).   Echo 01/02/13 EF 15% Grade 1 diastolic dysfunction. RV ok. No PAH Echo 07/10/13 EF 20% RV ok Echo 10/13/14 EF 15-20% RV ok Echo 10/2015 EF 15%  Echo 09/30/16 LVEF 10-15%, Grade 1 DD, mild PI, Mild TR. Normal RV.  Review of systems complete and found to be negative unless listed in HPI.      Past Medical History:  Diagnosis Date   Aortic dissection (HCC) 12/2008   Type B   Arthritis    lower back   Atrial fibrillation (HCC)    post op (left thoracotomy) 11/12 req amio/DCCV   Breast cancer (HCC) 07/2009   Stage 3 lumpectomy, radiation, chemotherapy; finished chemo October, felt to be in remission   Cardiac defibrillator in place    St. Jude (JA) 9/12; LV lead placement unsuccessful;  s/p left thoracotomy with placement of epicardial LV lead 09/2011 (Dr. PVT)   CHF (congestive heart failure) (HCC)    Chronic systolic dysfunction of left ventricle    Depression    DVT (deep venous thrombosis) (HCC) 10/18/2010   GERD (gastroesophageal reflux disease)    Glaucoma    Hiatal hernia    small hiatal hernia   History of shingles  Hyperlipidemia    Hypertension    Left bundle branch block    Nonischemic cardiomyopathy (HCC)    Admission 12/11 with EF 25%, normal coronary arteries by cath 12/11; NICM presumed to be secondary to chemotherapy for breast cancer   Obesity    [name]    with exertion   Current Outpatient Medications  Medication Sig Dispense Refill   ALPRAZolam (XANAX) 1 MG tablet Take 0.5 mg by mouth daily as needed for anxiety. Anxiety      aspirin 81 MG EC tablet Take 81 mg by mouth daily.     benzonatate (TESSALON) 200 MG capsule Take 200 mg by mouth 3 (three) times daily as needed.     bimatoprost (LUMIGAN) 0.03  % ophthalmic solution Place 1 drop into both eyes at bedtime. Reported on 01/12/2016     Calcium Carbonate-Vitamin D (CALCIUM-VITAMIN D3) 600-200 MG-UNIT TABS Take 1 tablet by mouth 2 (two) times daily.     carvedilol (COREG) 12.5 MG tablet TAKE ONE TABLET BY MOUTH TWICE DAILY 180 tablet 3   cetirizine (ZYRTEC) 10 MG tablet Take 1 tablet by mouth daily.     cholecalciferol (VITAMIN D) 1000 UNITS tablet Take 1,000 Units by mouth 2 (two) times daily.     COLCRYS 0.6 MG tablet TAKE ONE TABLET BY MOUTH EVERY DAY 30 tablet 1   [name] 5 MG TABS tablet TAKE 1/2 TABLET BY MOUTH TWICE DAILY (Patient taking differently: Take 2.5 mg by mouth 2 (two) times daily. If hr greater than 70) 90 tablet 3   cyclobenzaprine (FLEXERIL) 10 MG tablet Take 1 tablet (10 mg total) by mouth 3 (three) times daily as needed for muscle spasms. 30 tablet 0   diclofenac sodium (VOLTAREN) 1 % GEL APPLY THREE GRAMS TO THREE LARGE JOINTS UP TO THREE TIMES DAILY AS NEEDED 3 Tube 3   ENTRESTO 49-51 MG TAKE ONE TABLET BY MOUTH TWICE DAILY 180 tablet 3   furosemide (LASIX) 40 MG tablet Take 40 mg by mouth daily.     levothyroxine (SYNTHROID) 25 MCG tablet Take 25 mcg by mouth daily.     montelukast (SINGULAIR) 10 MG tablet Take 10 mg by mouth at bedtime. For allergies & congestion     Multiple Vitamin (MULTIVITAMIN) tablet Take 1 tablet by mouth daily.     omeprazole (PRILOSEC) 40 MG capsule TAKE ONE CAPSULE BY MOUTH TWICE DAILY 60 capsule 8   PARoxetine (PAXIL) 20 MG tablet Take 20 mg by mouth daily.  1   Polyethyl Glycol-Propyl Glycol (SYSTANE OP) Apply 1 drop to eye daily as needed (dry eyes).      rosuvastatin (CRESTOR) 5 MG tablet TAKE ONE TABLET BY MOUTH EVERY EVENING 90 tablet 3   spironolactone (ALDACTONE) 25 MG tablet TAKE ONE TABLET BY MOUTH daily 90 tablet 3   traMADol (ULTRAM) 50 MG tablet Take 50 mg by mouth every 6 (six) hours as needed for pain.     No current facility-administered medications for this encounter.    BP (!) 88/60   Pulse 76   Wt 128.2 kg (282 lb 9.6 oz)   SpO2 93%   BMI 52.53 kg/m   Wt Readings from Last 3 Encounters:  04/25/23 128.2 kg (282 lb 9.6 oz)  04/24/23 127.7 kg (281 lb 9.6 oz)  09/01/22 133.5 kg (294 lb 6.4 oz)   Physical Exam  General:  Obese woman sitting in WC. No resp difficulty HEENT: normal Neck: supple. no JVD. Carotids 2+ bilat; no bruits. No  lymphadenopathy or thryomegaly appreciated. Cor: PMI nondisplaced. Regular rate & rhythm. No rubs, gallops or murmurs. Lungs: clear Abdomen: obese soft, nontender, nondistended. No hepatosplenomegaly. No bruits or masses. Good bowel sounds. Extremities: no cyanosis, clubbing, rash, edema Neuro: alert & orientedx3, cranial nerves grossly intact. moves all 4 extremities w/o difficulty. Affect pleasant   Device interrogation: No VT/AF. 99% biv pacing. Volume mildly elevated. Personally reviewed   ASSESSMENT & PLAN:  1. Chronic systolic CHF: Nonischemic cardiomyopathy.   - Echo (12/17): EF 10-15% s/p St Jude Bi-V ICD with epicardial LV lead.  - Echo (2/19): EF 20% stable. - CPX in (8/16) was similar to the past: it appeared that the major limitation was from body habitus/deconditioning and not cardiac - Echo (3/21): EF 10-15% severe dyssynchrony, RV ok  - Underwent AV optimization of CRT in 4/21 QRS 170 -> - Echo (9/21): EF 20-25% with dyssynchrony RV ok  - Echo (5/22): EF 20-25% RV ok  - Echo (11/23): EF 20-25% RV ok  - Stable NYHA III-IIIB. Volume ok. BP low  - Cut Entresto 49/51 mg bid -> 24/26 bid - Continue Lasix 40 mg daily - Cut carvedilol 12.5 bid -> 6.25 bid.  - Continue spironolactone 25 mg daily.  - No SGLT2i due to concern for UTI. - Continue [name] 2.5 mg bid  - (no Batwire with CRT-D). - Labs today - ICD interrogated personally as above. Pending ICD gen change with Dr. Elberta Fortis  2. Paroxysmal atrial fibrillation  - Regular on exam. No AF of device - Continue ASA 81 mg daily.  - If  recurs, will need antiocoag with CHA2DS2/VAS of 4.   3. Type B aortic dissection  - stable  4. Morbid obesity - Body mass index is 52.53 kg/m. - Consider [id number]   Arvilla Meres, MD  3:13 PM

## 2023-05-01 ENCOUNTER — Ambulatory Visit: Payer: PPO | Attending: Cardiology

## 2023-05-01 DIAGNOSIS — Z95810 Presence of automatic (implantable) cardiac defibrillator: Secondary | ICD-10-CM

## 2023-05-01 DIAGNOSIS — I5022 Chronic systolic (congestive) heart failure: Secondary | ICD-10-CM

## 2023-05-03 NOTE — Progress Notes (Signed)
EPIC Encounter for ICM Monitoring  Patient Name: [name] is a 72 y.o. female Date: 05/03/2023 Primary Care Physican: Gordan Payment., MD Primary Cardiologist: Bensimhon Electrophysiologist: Kathreen Cornfield Pacing: 99%           09/28/2022 Weight: 285 lbs 12/14/2022 Weight: 297 lbs 01/16/2023 Weight: 297 lbs 05/03/2023 Weight: 281.6 lbs   Battery Longevity: ERI 6/9         Spoke with patient and heart failure questions reviewed.  Transmission results reviewed.  Pt asymptomatic for fluid accumulation.    She was on vacation during decreased impedance.   Corvue thoracic impedance normal but was suggesting possible fluid accumulation from 6/24-6/29.   Prescribed: Furosemide 40 mg 1 tablet by mouth daily.   Per HF clinic OV note 01/31/22 she can take extra Furosemide as needed.   Labs: 04/25/2023 Creatinine 1.29, BUN 25, Potassium 3.8, Sodium 136, GFR 44 A complete set of results can be found in Results Review.   Recommendations:  No changes and encouraged to call if experiencing any fluid symptoms.   Follow-up plan: ICM clinic phone appointment on 06/05/2023.  91 day device clinic remote transmission pending battery replacement.    EP/Cardiology Office Visits:  07/26/2023 with HF Clinic.  Battery replacement scheduled 05/22/2023.    Copy of ICM check sent to Dr. Elberta Fortis.  3 month ICM trend: 05/01/2023.    12-14 Month ICM trend:     Karie Soda, RN 05/03/2023 2:19 PM

## 2023-05-16 ENCOUNTER — Telehealth: Payer: Self-pay | Admitting: Cardiology

## 2023-05-16 NOTE — Telephone Encounter (Signed)
Patient would like to discuss instructions for 7/22 procedure with Dr. Elberta Fortis.

## 2023-05-16 NOTE — Telephone Encounter (Signed)
Pt had misplaced her Instruction letter. I went over it with her - she understands all and has no further questions.

## 2023-05-17 LAB — CUP PACEART REMOTE DEVICE CHECK
Battery Remaining Longevity: 0 mo
Battery Voltage: 2.59 V
Brady Statistic AP VP Percent: 6.4 %
Brady Statistic AP VS Percent: 1 %
Brady Statistic AS VP Percent: 90 %
Brady Statistic AS VS Percent: 1.4 %
Brady Statistic RA Percent Paced: 5.5 %
Date Time Interrogation Session: 20240717020024
HighPow Impedance: 120 Ohm
HighPow Impedance: 120 Ohm
Implantable Lead Connection Status: 753985
Implantable Lead Connection Status: 753985
Implantable Lead Connection Status: 753985
Implantable Lead Implant Date: 20120914
Implantable Lead Implant Date: 20120914
Implantable Lead Implant Date: 20121116
Implantable Lead Location: 753858
Implantable Lead Location: 753859
Implantable Lead Location: 753860
Implantable Lead Model: 511212
Implantable Lead Serial Number: 194059
Implantable Pulse Generator Implant Date: 20170314
Lead Channel Impedance Value: 260 Ohm
Lead Channel Impedance Value: 390 Ohm
Lead Channel Impedance Value: 410 Ohm
Lead Channel Pacing Threshold Amplitude: 1 V
Lead Channel Pacing Threshold Amplitude: 1.125 V
Lead Channel Pacing Threshold Amplitude: 1.25 V
Lead Channel Pacing Threshold Pulse Width: 0.5 ms
Lead Channel Pacing Threshold Pulse Width: 0.5 ms
Lead Channel Pacing Threshold Pulse Width: 0.7 ms
Lead Channel Sensing Intrinsic Amplitude: 1 mV
Lead Channel Sensing Intrinsic Amplitude: 11.7 mV
Lead Channel Setting Pacing Amplitude: 2 V
Lead Channel Setting Pacing Amplitude: 2.125
Lead Channel Setting Pacing Amplitude: 2.75 V
Lead Channel Setting Pacing Pulse Width: 0.5 ms
Lead Channel Setting Pacing Pulse Width: 0.7 ms
Lead Channel Setting Sensing Sensitivity: 0.5 mV
Pulse Gen Serial Number: 7328650

## 2023-05-18 ENCOUNTER — Ambulatory Visit (INDEPENDENT_AMBULATORY_CARE_PROVIDER_SITE_OTHER): Payer: PPO

## 2023-05-18 DIAGNOSIS — I428 Other cardiomyopathies: Secondary | ICD-10-CM

## 2023-05-21 NOTE — Pre-Procedure Instructions (Signed)
Attempted to call patient regarding procedure instructions.  Left voicemail on the following items: Arrival time 1100 Nothing to eat or drink after midnight No meds AM of procedure Responsible person to drive you home and stay with you for 24 hrs Wash with special soap night before and morning of procedure  

## 2023-05-22 ENCOUNTER — Ambulatory Visit (HOSPITAL_COMMUNITY)
Admission: RE | Admit: 2023-05-22 | Discharge: 2023-05-22 | Disposition: A | Discharge status: Home / Self Care | Payer: PPO | Source: Home / Self Care | Attending: Cardiology | Admitting: Cardiology

## 2023-05-22 ENCOUNTER — Other Ambulatory Visit: Payer: Self-pay

## 2023-05-22 ENCOUNTER — Ambulatory Visit (HOSPITAL_COMMUNITY)
Admission: RE | Disposition: A | Discharge status: Home / Self Care | Payer: Self-pay | Source: Home / Self Care | Attending: Cardiology

## 2023-05-22 DIAGNOSIS — E785 Hyperlipidemia, unspecified: Secondary | ICD-10-CM | POA: Insufficient documentation

## 2023-05-22 DIAGNOSIS — Z4502 Encounter for adjustment and management of automatic implantable cardiac defibrillator: Secondary | ICD-10-CM | POA: Diagnosis not present

## 2023-05-22 DIAGNOSIS — I428 Other cardiomyopathies: Secondary | ICD-10-CM | POA: Insufficient documentation

## 2023-05-22 DIAGNOSIS — I110 Hypertensive heart disease with heart failure: Secondary | ICD-10-CM | POA: Insufficient documentation

## 2023-05-22 DIAGNOSIS — I480 Paroxysmal atrial fibrillation: Secondary | ICD-10-CM | POA: Diagnosis not present

## 2023-05-22 DIAGNOSIS — I5022 Chronic systolic (congestive) heart failure: Secondary | ICD-10-CM | POA: Diagnosis not present

## 2023-05-22 DIAGNOSIS — I7100 Dissection of unspecified site of aorta: Secondary | ICD-10-CM | POA: Insufficient documentation

## 2023-05-22 DIAGNOSIS — Z9221 Personal history of antineoplastic chemotherapy: Secondary | ICD-10-CM | POA: Insufficient documentation

## 2023-05-22 DIAGNOSIS — I442 Atrioventricular block, complete: Secondary | ICD-10-CM | POA: Insufficient documentation

## 2023-05-22 DIAGNOSIS — I4892 Unspecified atrial flutter: Secondary | ICD-10-CM | POA: Insufficient documentation

## 2023-05-22 HISTORY — PX: BIV ICD GENERATOR CHANGEOUT: EP1194

## 2023-05-22 SURGERY — BIV ICD GENERATOR CHANGEOUT

## 2023-05-22 MED ORDER — ONDANSETRON HCL 4 MG/2ML IJ SOLN: 4.0000 mg | Freq: Four times a day (QID) | INTRAMUSCULAR | Status: DC | PRN

## 2023-05-22 MED ORDER — FENTANYL CITRATE (PF) 100 MCG/2ML IJ SOLN
INTRAMUSCULAR | Status: AC
  Filled 2023-05-22: qty 2

## 2023-05-22 MED ORDER — CEFAZOLIN SODIUM-DEXTROSE 2-4 GM/100ML-% IV SOLN
INTRAVENOUS | Status: AC
  Filled 2023-05-22: qty 100

## 2023-05-22 MED ORDER — LIDOCAINE HCL (PF) 1 % IJ SOLN
INTRAMUSCULAR | Status: DC | PRN
  Administered 2023-05-22: 50 mL

## 2023-05-22 MED ORDER — FENTANYL CITRATE (PF) 100 MCG/2ML IJ SOLN
INTRAMUSCULAR | Status: DC | PRN
  Administered 2023-05-22 (×2): 25 ug via INTRAVENOUS

## 2023-05-22 MED ORDER — SODIUM CHLORIDE 0.9 % IV SOLN: INTRAVENOUS | Status: DC

## 2023-05-22 MED ORDER — LIDOCAINE HCL 1 % IJ SOLN
INTRAMUSCULAR | Status: AC
  Filled 2023-05-22: qty 60

## 2023-05-22 MED ORDER — POVIDONE-IODINE 10 % EX SWAB
2.0000 | Freq: Once | CUTANEOUS | Status: AC
  Administered 2023-05-22: 2 via TOPICAL

## 2023-05-22 MED ORDER — CHLORHEXIDINE GLUCONATE 4 % EX SOLN: 4.0000 | Freq: Once | CUTANEOUS | Status: DC

## 2023-05-22 MED ORDER — MIDAZOLAM HCL 5 MG/5ML IJ SOLN
INTRAMUSCULAR | Status: AC
  Filled 2023-05-22: qty 5

## 2023-05-22 MED ORDER — CEFAZOLIN IN SODIUM CHLORIDE 3-0.9 GM/100ML-% IV SOLN
3.0000 g | INTRAVENOUS | Status: AC
  Administered 2023-05-22: 3 g via INTRAVENOUS

## 2023-05-22 MED ORDER — SODIUM CHLORIDE 0.9 % IV SOLN: 80.0000 mg | INTRAVENOUS | Status: AC

## 2023-05-22 MED ORDER — ACETAMINOPHEN 325 MG PO TABS: 325.0000 mg | ORAL_TABLET | ORAL | Status: DC | PRN

## 2023-05-22 MED ORDER — MIDAZOLAM HCL 5 MG/5ML IJ SOLN
INTRAMUSCULAR | Status: DC | PRN
  Administered 2023-05-22 (×2): 1 mg via INTRAVENOUS

## 2023-05-22 MED ORDER — SODIUM CHLORIDE 0.9 % IV SOLN
INTRAVENOUS | Status: AC
  Administered 2023-05-22: 80 mg
  Filled 2023-05-22: qty 2

## 2023-05-22 SURGICAL SUPPLY — 7 items
CABLE SURGICAL S-101-97-12 (CABLE) ×1 IMPLANT
ICD UNIFY ASUR CRT CD3357-40Q (ICD Generator) IMPLANT
MAT PREVALON FULL STRYKER (MISCELLANEOUS) IMPLANT
PAD DEFIB RADIO PHYSIO CONN (PAD) ×1 IMPLANT
POUCH AIGIS-R ANTIBACT ICD (Mesh General) ×1 IMPLANT
POUCH AIGIS-R ANTIBACT ICD LRG (Mesh General) IMPLANT
TRAY PACEMAKER INSERTION (PACKS) ×1 IMPLANT

## 2023-05-22 NOTE — Discharge Instructions (Signed)

## 2023-05-22 NOTE — Interval H&P Note (Signed)
History and Physical Interval Note:  05/22/2023 1:10 PM  [name]  has presented today for surgery, with the diagnosis of eri.  The various methods of treatment have been discussed with the patient and family. After consideration of risks, benefits and other options for treatment, the patient has consented to  Procedure(s): BIV ICD GENERATOR CHANGEOUT (N/A) as a surgical intervention.  The patient's history has been reviewed, patient examined, no change in status, stable for surgery.  I have reviewed the patient's chart and labs.  Questions were answered to the patient's satisfaction.     Rache Klimaszewski Jorja Loa  ICD Criteria  Current LVEF:20-25%. Within 12 months prior to implant: Yes   Heart failure history: Yes, Class II  Cardiomyopathy history: Yes, Non-Ischemic Cardiomyopathy.  Atrial Fibrillation/Atrial Flutter: Yes, Paroxysmal.  Ventricular tachycardia history: No.  Cardiac arrest history: No.  History of syndromes with risk of sudden death: No.  Previous ICD: Yes, Reason for ICD:  Primary prevention.  Current ICD indication: Primary  PPM indication: Yes. Pacing type: Ventricular. Greater than 40% RV pacing requirement anticipated. Indication: Complete Heart Block  Class I or II Bradycardia indication present: No  Beta Blocker therapy for 3 or more months: Yes, prescribed.   Ace Inhibitor/ARB therapy for 3 or more months: Yes, prescribed.    I have seen [name] is a 72 y.o. femalepre-procedural and has been referred by Bensimhon for consideration of ICD implant for primary prevention of sudden death.  The patient's chart has been reviewed and they meet criteria for ICD gen change.  I have had a thorough discussion with the patient reviewing options.  The patient and their family (if available) have had opportunities to ask questions and have them answered. The patient and I have decided together through the [facility] Share Decision Support Tool to gen  change ICD at this time.  Risks, benefits, alternatives to ICD implantation were discussed in detail with the patient today. The patient  understands that the risks include but are not limited to bleeding, infection, pneumothorax, perforation, tamponade, vascular damage, renal failure, MI, stroke, death, inappropriate shocks, and lead dislodgement and wishes to proceed.

## 2023-05-23 ENCOUNTER — Encounter (HOSPITAL_COMMUNITY): Payer: Self-pay | Admitting: Cardiology

## 2023-05-23 MED FILL — Lidocaine HCl Local Inj 1%: INTRAMUSCULAR | Qty: 50 | Status: AC

## 2023-06-01 NOTE — Progress Notes (Signed)
Remote ICD transmission.   

## 2023-06-05 ENCOUNTER — Ambulatory Visit: Payer: PPO

## 2023-06-05 ENCOUNTER — Encounter: Payer: HMO | Admitting: Cardiology

## 2023-06-05 NOTE — Progress Notes (Unsigned)
ICM transmission rescheduled for 06/26/2023 to allow time for CorVue to develop post battery replacement on 7/22.

## 2023-06-07 ENCOUNTER — Ambulatory Visit: Payer: PPO

## 2023-06-07 NOTE — Patient Instructions (Signed)
   After Your ICD (Implantable Cardiac Defibrillator)    Monitor your defibrillator site for redness, swelling, and drainage. Call the device clinic at [phone] if you experience these symptoms or fever/chills.  Your incision was closed with Steri-strips or staples:  You may shower 7 days after your procedure and wash your incision with soap and water. Avoid lotions, ointments, or perfumes over your incision until it is well-healed.  You may use a hot tub or a pool after your wound check appointment if the incision is completely closed.   There are no other restrictions in arm movement after your wound check appointment.   Your ICD is designed to protect you from life threatening heart rhythms. Because of this, you may receive a shock.   1 shock with no symptoms:  Call the office during business hours. 1 shock with symptoms (chest pain, chest pressure, dizziness, lightheadedness, shortness of breath, overall feeling unwell):  Call 911. If you experience 2 or more shocks in 24 hours:  Call 911. If you receive a shock, you should not drive.  [place] DMV - no driving for 6 months if you receive appropriate therapy from your ICD.   ICD Alerts:  Some alerts are vibratory and others beep. These are NOT emergencies. Please call our office to let us know. If this occurs at night or on weekends, it can wait until the next business day. Send a remote transmission.  If your device is capable of reading fluid status (for heart failure), you will be offered monthly monitoring to review this with you.   Remote monitoring is used to monitor your ICD from home. This monitoring is scheduled every 91 days by our office. It allows us to keep an eye on the functioning of your device to ensure it is working properly. You will routinely see your Electrophysiologist annually (more often if necessary).  

## 2023-06-08 ENCOUNTER — Ambulatory Visit: Payer: PPO

## 2023-06-08 DIAGNOSIS — I428 Other cardiomyopathies: Secondary | ICD-10-CM

## 2023-06-08 LAB — CUP PACEART INCLINIC DEVICE CHECK
Battery Remaining Longevity: 57 mo
Brady Statistic RA Percent Paced: 5.1 %
Brady Statistic RV Percent Paced: 98 %
Date Time Interrogation Session: 20240808135839
HighPow Impedance: 81 Ohm
Implantable Lead Connection Status: 753985
Implantable Lead Connection Status: 753985
Implantable Lead Connection Status: 753985
Implantable Lead Implant Date: 20120914
Implantable Lead Implant Date: 20120914
Implantable Lead Implant Date: 20121116
Implantable Lead Location: 753858
Implantable Lead Location: 753859
Implantable Lead Location: 753860
Implantable Lead Model: 511212
Implantable Lead Serial Number: 194059
Implantable Pulse Generator Implant Date: 20240722
Lead Channel Impedance Value: 275 Ohm
Lead Channel Impedance Value: 425 Ohm
Lead Channel Impedance Value: 450 Ohm
Lead Channel Pacing Threshold Amplitude: 0.75 V
Lead Channel Pacing Threshold Amplitude: 0.75 V
Lead Channel Pacing Threshold Amplitude: 1.25 V
Lead Channel Pacing Threshold Amplitude: 1.25 V
Lead Channel Pacing Threshold Amplitude: 1.5 V
Lead Channel Pacing Threshold Amplitude: 1.5 V
Lead Channel Pacing Threshold Pulse Width: 0.5 ms
Lead Channel Pacing Threshold Pulse Width: 0.5 ms
Lead Channel Pacing Threshold Pulse Width: 0.5 ms
Lead Channel Pacing Threshold Pulse Width: 0.5 ms
Lead Channel Pacing Threshold Pulse Width: 0.7 ms
Lead Channel Pacing Threshold Pulse Width: 0.7 ms
Lead Channel Sensing Intrinsic Amplitude: 1 mV
Lead Channel Sensing Intrinsic Amplitude: 11.6 mV
Lead Channel Setting Pacing Amplitude: 1.75 V
Lead Channel Setting Pacing Amplitude: 2.25 V
Lead Channel Setting Pacing Amplitude: 2.75 V
Lead Channel Setting Pacing Pulse Width: 0.5 ms
Lead Channel Setting Pacing Pulse Width: 0.7 ms
Lead Channel Setting Sensing Sensitivity: 0.5 mV
Pulse Gen Serial Number: 5552877

## 2023-06-08 NOTE — Progress Notes (Signed)
Wound check appointment. Steri-strips removed. Wound without redness or edema. Incision edges approximated, wound well healed. Normal device function. BIVP effectively:  98%. Thresholds, sensing, and impedances consistent with implant measurements. Device programmed appropriately for chronic leads. Histogram distribution appropriate for patient and level of activity. No mode switches, AT/AF burden 0%, no ventricular arrhythmias noted. Patient educated about wound care. There are no arm mobility or lifting restrictions.  Shock plan and auditory alerts reviewed.  ROV in 3 months with implanting physician.

## 2023-06-26 ENCOUNTER — Ambulatory Visit: Payer: PPO

## 2023-06-26 DIAGNOSIS — I5022 Chronic systolic (congestive) heart failure: Secondary | ICD-10-CM | POA: Diagnosis not present

## 2023-06-26 DIAGNOSIS — Z95810 Presence of automatic (implantable) cardiac defibrillator: Secondary | ICD-10-CM | POA: Diagnosis not present

## 2023-06-27 ENCOUNTER — Telehealth: Payer: Self-pay

## 2023-06-27 NOTE — Progress Notes (Signed)
EPIC Encounter for ICM Monitoring  Patient Name: [name] is a 72 y.o. female Date: 06/27/2023 Primary Care Physican: Gordan Payment., MD Primary Cardiologist: Bensimhon Electrophysiologist: Camnitz Bi-V Pacing: 96%           09/28/2022 Weight: 285 lbs 12/14/2022 Weight: 297 lbs 01/16/2023 Weight: 297 lbs 05/03/2023 Weight: 281.6 lbs         Attempted call to patient and unable to reach.  Left detailed message per DPR regarding transmission. Transmission reviewed.    Corvue thoracic impedance suggesting possible fluid accumulation starting 8/21.  Possible dryness 8/12-8/20.   Prescribed:  Furosemide 40 mg 1 tablet by mouth daily.  Spironolactone 25 mg take 1 tablet daily.       Labs: 04/25/2023 Creatinine 1.29, BUN 25, Potassium 3.8, Sodium 136, GFR 44 A complete set of results can be found in Results Review.   Recommendations:  Left voice mail with ICM number and encouraged to call if experiencing any fluid symptoms.     Follow-up plan: ICM clinic phone appointment on 07/04/2023 to recheck fluid levels.  91 day device clinic remote transmission 08/22/2023.    EP/Cardiology Office Visits:  07/26/2023 with HF Clinic.  08/23/2023 with Dr Elberta Fortis.     Copy of ICM check sent to Dr. Elberta Fortis.   Will send to HF clinic for review if patient is reached.   3 month ICM trend: 06/26/2023.    12-14 Month ICM trend:     Karie Soda, RN 06/27/2023 2:04 PM

## 2023-06-27 NOTE — Telephone Encounter (Signed)
 Remote ICM transmission received.  Attempted call to patient regarding ICM remote transmission and left detailed message per DPR.  Left ICM phone number and advised to return call for any fluid symptoms or questions. Next ICM remote transmission scheduled 07/04/2023.

## 2023-06-28 NOTE — Progress Notes (Signed)
Attempted call to patient to provide recommendations and unable to reach.  Mailbox is full.

## 2023-06-28 NOTE — Progress Notes (Signed)
  Received: Today Camnitz, Will Daphine Deutscher, MD  Deondria Puryear, Josephine Igo, RN Low salt diet, increase lasix to 40 mg BID for 4 days.

## 2023-06-29 NOTE — Progress Notes (Signed)
Unable to reach patient to provide recommendations.

## 2023-07-06 NOTE — Progress Notes (Signed)
No ICM remote transmission received for 07/04/2023 and next ICM transmission scheduled for 07/17/2023.

## 2023-07-10 ENCOUNTER — Other Ambulatory Visit (HOSPITAL_COMMUNITY): Payer: Self-pay | Admitting: Internal Medicine

## 2023-07-17 ENCOUNTER — Ambulatory Visit: Payer: PPO | Attending: Cardiology

## 2023-07-17 DIAGNOSIS — I5022 Chronic systolic (congestive) heart failure: Secondary | ICD-10-CM

## 2023-07-17 DIAGNOSIS — Z95810 Presence of automatic (implantable) cardiac defibrillator: Secondary | ICD-10-CM

## 2023-07-18 ENCOUNTER — Telehealth: Payer: Self-pay

## 2023-07-18 NOTE — Telephone Encounter (Signed)
Remote ICM transmission received.  Attempted call to patient regarding ICM remote transmission and left detailed message per DPR.  Left ICM phone number and advised to return call for any fluid symptoms or questions. Next ICM remote transmission scheduled 08/07/2023.

## 2023-07-18 NOTE — Progress Notes (Signed)
EPIC Encounter for ICM Monitoring  Patient Name: [name] is a 72 y.o. female Date: 07/18/2023 Primary Care Physican: Gordan Payment., MD Primary Cardiologist: Bensimhon Electrophysiologist: Elberta Fortis Bi-V Pacing: 9%           09/28/2022 Weight: 285 lbs 12/14/2022 Weight: 297 lbs 01/16/2023 Weight: 297 lbs 05/03/2023 Weight: 281.6 lbs         Attempted call to patient and unable to reach.  Left detailed message per DPR regarding transmission. Transmission reviewed.    Corvue thoracic impedance suggesting possible fluid accumulation 8/21-8/28 and changed to possible dryness from 9/1-9/13.  Returned to baseline 9/14.   Prescribed:  Furosemide 40 mg 1 tablet by mouth daily.  Spironolactone 25 mg take 1 tablet daily.       Labs: 04/25/2023 Creatinine 1.29, BUN 25, Potassium 3.8, Sodium 136, GFR 44 A complete set of results can be found in Results Review.   Recommendations:  Left voice mail with ICM number and encouraged to call if experiencing any fluid symptoms.     Follow-up plan: ICM clinic phone appointment on 08/07/2023.  91 day device clinic remote transmission 08/22/2023.    EP/Cardiology Office Visits:  07/26/2023 with HF Clinic.  08/23/2023 with Dr Elberta Fortis.     Copy of ICM check sent to Dr. Elberta Fortis.  3 month ICM trend: 07/17/2023.    12-14 Month ICM trend:     Karie Soda, RN 07/18/2023 2:36 PM

## 2023-07-21 NOTE — Progress Notes (Signed)
Advanced Heart Failure Clinic Note  Oncologist: Dr. Amie Critchley at [facility] EP: Dr. Johney Frame PCP: Gordan Payment., MD HF: Bensimhon  HPI: Ms. Sas is a 72 y.o. woman with a history of HTN, Type B aortic dissection (2010) managed with medical therapy by Dr. Donata Clay and has healed, LBBB, systolic heart failure due to NICM with EF 10-15% Cath Dec 2011 showed normal arteries.    She was diagnosed with breast cancer in 2009 s/p lumpectomy, XRT, and chemotherapy.  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   She underwent St Jude Bi-V ICD implantation Sept 2012 but unable to place LV lead.  She eventually brought back in Nov 2012 for left thoracotomy and placement of epicardial LV lead placement by Dr. Donata Clay.  She also had a sleep study that showed mild OSA.  No clinical intervention occurred.    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 Started PPI and famotidine. Question also raised about possible trial off ARNI.   Follow up 5/22 , stable NYHA II-III. Echo  EF 20-25%, RV ok. Screened for [employer].  Echo 11/23 EF 20-25% RV ok  Follow up 6/24, NYHA III-IIIb. Volume ok, Coreg decreased to 6.25 and Entresto to 24/26 due to low BP.  S/p gen change 7/24.  Today she returns for HF follow up with her husband. Overall feeling fine. Breathing worse in heat of the summer, no SOB using rollator around the house. Denies palpitations, abnormal bleeding, CP, dizziness, edema, or PND/Orthopnea. Appetite ok. No fever or chills.Does not weigh at home. Taking all medications. HR 60-70s at home.  Cardiac Studies: - Echo (11/23): EF 20-25%, RV ok - Echo (5/22): EF 20-25% RV ok  - Echo (9/21): EF 20-25% with dyssynchrony RV ok  - Echo (3/21): EF 10% with severe dyssynchrony. RV ok  - Echo (2/19): EF 20%, RV ok.  - CPX (8/16): Peak VO2 10.1 VE/VCO2 slope 28.4 RER 1.14 OUES 1.3 PFTs  mildly restrictive No significant change compared to prior.  Limited more by obesity and restrictive lung physiology than heart (mild circulatory limitation).   - CPX (4/15): Peak VO2: 10.3 (66.4% predicted peak VO2) VE/VCO2 slope: 25.0 OUES: 1.30 Peak RER: 1.20  - CPX (12/13) Peak VO2: 10.6 % predicted peak VO2: 64.4% VE/VCO2 slope: 26.9 OUES: 1.18 Peak RER: 1.29 Ventilatory Threshold: 7.3 % predicted peak VO2: 44.4% Peak RR 23 Peak Ventilation: 29.8 VE/MVV: 32.6% PETCO2 at peak: 43  Echo 12/17 EF 10-15%, Grade 1 DD, mild PI, Mild TR. Normal RV. Echo 12/16 EF 15%  Echo 12/15 EF 15-20% RV ok Echo 9/14 EF 20% RV ok Echo 3/14 EF 15% Grade 1 diastolic dysfunction. RV ok. No PAH  Review of systems complete and found to be negative unless listed in HPI.      Past Medical History:  Diagnosis Date   Aortic dissection (HCC) 12/2008   Type B   Arthritis    lower back   Atrial fibrillation (HCC)    post op (left thoracotomy) 11/12 req amio/DCCV   Breast cancer (HCC) 07/2009   Stage 3 lumpectomy, radiation, chemotherapy; finished chemo October, felt to be in remission   Cardiac defibrillator in place    St. Jude (JA) 9/12; LV lead placement unsuccessful;  s/p left thoracotomy with placement of epicardial LV lead 09/2011 (Dr. PVT)   CHF (congestive heart failure) (HCC)    Chronic systolic dysfunction  of left ventricle    Depression    DVT (deep venous thrombosis) (HCC) 10/18/2010   GERD (gastroesophageal reflux disease)    Glaucoma    Hiatal hernia    small hiatal hernia   History of shingles    Hyperlipidemia    Hypertension    Left bundle branch block    Nonischemic cardiomyopathy (HCC)    Admission 12/11 with EF 25%, normal coronary arteries by cath 12/11; NICM presumed to be secondary to chemotherapy for breast cancer   Obesity    [name]    with exertion   Current Outpatient Medications  Medication Sig Dispense Refill   allopurinol (ZYLOPRIM) 300 MG  tablet Take 300 mg by mouth daily.     aspirin 81 MG EC tablet Take 81 mg by mouth daily.     bimatoprost (LUMIGAN) 0.03 % ophthalmic solution Place 1 drop into both eyes at bedtime. Reported on 01/12/2016     Calcium Carbonate-Vitamin D (CALCIUM-VITAMIN D3) 600-200 MG-UNIT TABS Take 1 tablet by mouth 2 (two) times daily.     carvedilol (COREG) 6.25 MG tablet Take 1 tablet (6.25 mg total) by mouth 2 (two) times daily. 180 tablet 3   cetirizine (ZYRTEC) 10 MG tablet Take 10 mg by mouth at bedtime.     Cholecalciferol (VITAMIN D) 125 MCG (5000 UT) CAPS Take 5,000 Units by mouth daily.     clotrimazole-betamethasone (LOTRISONE) cream Apply 1 Application topically 2 (two) times daily as needed (Rash).     COLCRYS 0.6 MG tablet TAKE ONE TABLET BY MOUTH EVERY DAY 30 tablet 1   cyanocobalamin (VITAMIN B12) 1000 MCG tablet Take 1,000 mcg by mouth daily.     cyclobenzaprine (FLEXERIL) 10 MG tablet Take 1 tablet (10 mg total) by mouth 3 (three) times daily as needed for muscle spasms. 30 tablet 0   dextromethorphan (DELSYM) 30 MG/5ML liquid Take 5 mLs by mouth at bedtime.     diclofenac sodium (VOLTAREN) 1 % GEL APPLY THREE GRAMS TO THREE LARGE JOINTS UP TO THREE TIMES DAILY AS NEEDED 3 Tube 3   docusate sodium (COLACE) 100 MG capsule Take 100 mg by mouth 2 (two) times daily as needed for mild constipation.     famotidine (PEPCID) 20 MG tablet Take 20 mg by mouth 2 (two) times daily.     fluticasone (FLONASE) 50 MCG/ACT nasal spray Place 1 spray into both nostrils 2 (two) times daily.     furosemide (LASIX) 40 MG tablet Take 40 mg by mouth daily.     ivabradine ([name]) 5 MG TABS tablet Take 0.5 tablets (2.5 mg total) by mouth 2 (two) times daily. If hr greater than 70 90 tablet 3   levothyroxine (SYNTHROID) 25 MCG tablet Take 25 mcg by mouth daily.     montelukast (SINGULAIR) 10 MG tablet Take 10 mg by mouth at bedtime. For allergies & congestion     Multiple Vitamins-Minerals (OCUVITE PO) Take 5 mg by  mouth daily.     omeprazole (PRILOSEC) 40 MG capsule TAKE ONE CAPSULE BY MOUTH TWICE DAILY 60 capsule 8   PARoxetine (PAXIL) 30 MG tablet Take 30 mg by mouth at bedtime.  1   Polyethyl Glycol-Propyl Glycol (SYSTANE OP) Place 1 drop into both eyes daily as needed (dry eyes).     rosuvastatin (CRESTOR) 5 MG tablet TAKE ONE TABLET BY MOUTH EVERY EVENING 90 tablet 3   sacubitril-valsartan (ENTRESTO) 24-26 MG Take 1 tablet by mouth 2 (two) times daily. 60 tablet  11   sodium chloride (OCEAN) 0.65 % SOLN nasal spray Place 1 spray into both nostrils daily as needed for congestion.     sodium fluoride (DENTA 5000 PLUS) 1.1 % CREA dental cream Place 1 Application onto teeth 2 (two) times daily.     spironolactone (ALDACTONE) 25 MG tablet TAKE ONE TABLET BY MOUTH daily 90 tablet 3   traMADol (ULTRAM) 50 MG tablet Take 50 mg by mouth every 6 (six) hours as needed for pain.     vitamin E 1000 UNIT capsule Take 1,000 Units by mouth daily.     No current facility-administered medications for this encounter.   BP 118/64   Pulse 95   Wt 124.3 kg (274 lb 2 oz)   SpO2 95%   BMI 50.96 kg/m   Wt Readings from Last 3 Encounters:  07/26/23 124.3 kg (274 lb 2 oz)  05/22/23 127 kg (280 lb)  04/25/23 128.2 kg (282 lb 9.6 oz)   Physical Exam  General:  NAD. No resp difficulty, walked into clinic HEENT: Normal Neck: Supple. No JVD, thick neck. Carotids 2+ bilat; no bruits. No lymphadenopathy or thryomegaly appreciated. Cor: PMI nondisplaced. Regular rate & rhythm. No rubs, gallops or murmurs. Lungs: Clear Abdomen: Soft, obese, nontender, nondistended. No hepatosplenomegaly. No bruits or masses. Good bowel sounds. Extremities: No cyanosis, clubbing, rash, edema Neuro: Alert & oriented x 3, cranial nerves grossly intact. Moves all 4 extremities w/o difficulty. Affect pleasant.  Device interrogation (personally reviewed): No VT/AF. 97% biv pacing, volume stable  ASSESSMENT & PLAN: 1. Chronic systolic CHF:  Nonischemic cardiomyopathy.   - Echo (12/17): EF 10-15% s/p St Jude Bi-V ICD with epicardial LV lead.  - Echo (2/19): EF 20% stable. - CPX in (8/16) was similar to the past: it appeared that the major limitation was from body habitus/deconditioning and not cardiac - Echo (3/21): EF 10-15% severe dyssynchrony, RV ok  - Underwent AV optimization of CRT in 4/21 QRS 170 -> - Echo (9/21): EF 20-25% with dyssynchrony RV ok  - Echo (5/22): EF 20-25% RV ok  - Echo (11/23): EF 20-25% RV ok  - Stable NYHA III, volume ok on exam and by CorVue - Continue Entresto 24/26 bid - Continue Lasix 40 mg daily - Continue carvedilol 6.25 mg bid.  - Continue spironolactone 25 mg daily.  - No SGLT2i due to concern for UTI. - Continue [name] 2.5 mg bid, HR 91 here but generally 60-70s at home. - (no Batwire with CRT-D). - Labs today  2. Paroxysmal atrial fibrillation  - Regular on exam. No AF of device - Continue ASA 81 mg daily.  - If recurs, will need antiocoag with CHA2DS2/VAS of 4.   3. Type B aortic dissection  - Stable  4. Morbid obesity - Body mass index is 50.96 kg/m. - Consider GLP1RA. Check A1C and refer to PharmD for tirzepatide   Follow up in 3 months with APP   Jacklynn Ganong, FNP  3:57 PM

## 2023-07-26 ENCOUNTER — Telehealth (HOSPITAL_COMMUNITY): Payer: Self-pay | Admitting: Pharmacy Technician

## 2023-07-26 ENCOUNTER — Other Ambulatory Visit (HOSPITAL_COMMUNITY): Payer: Self-pay

## 2023-07-26 ENCOUNTER — Encounter (HOSPITAL_COMMUNITY): Payer: Self-pay

## 2023-07-26 ENCOUNTER — Ambulatory Visit (HOSPITAL_COMMUNITY)
Admission: RE | Admit: 2023-07-26 | Discharge: 2023-07-26 | Disposition: A | Discharge status: Home / Self Care | Payer: PPO | Source: Ambulatory Visit | Attending: Family Medicine | Admitting: Family Medicine

## 2023-07-26 VITALS — BP 118/64 | HR 95 | Wt 274.1 lb

## 2023-07-26 DIAGNOSIS — Z6841 Body Mass Index (BMI) 40.0 and over, adult: Secondary | ICD-10-CM | POA: Insufficient documentation

## 2023-07-26 DIAGNOSIS — Z9221 Personal history of antineoplastic chemotherapy: Secondary | ICD-10-CM | POA: Insufficient documentation

## 2023-07-26 DIAGNOSIS — Z7982 Long term (current) use of aspirin: Secondary | ICD-10-CM | POA: Diagnosis not present

## 2023-07-26 DIAGNOSIS — Z79899 Other long term (current) drug therapy: Secondary | ICD-10-CM | POA: Diagnosis not present

## 2023-07-26 DIAGNOSIS — Z853 Personal history of malignant neoplasm of breast: Secondary | ICD-10-CM | POA: Diagnosis not present

## 2023-07-26 DIAGNOSIS — I428 Other cardiomyopathies: Secondary | ICD-10-CM | POA: Insufficient documentation

## 2023-07-26 DIAGNOSIS — I447 Left bundle-branch block, unspecified: Secondary | ICD-10-CM | POA: Diagnosis not present

## 2023-07-26 DIAGNOSIS — I7100 Dissection of unspecified site of aorta: Secondary | ICD-10-CM

## 2023-07-26 DIAGNOSIS — Z95810 Presence of automatic (implantable) cardiac defibrillator: Secondary | ICD-10-CM | POA: Insufficient documentation

## 2023-07-26 DIAGNOSIS — G4733 Obstructive sleep apnea (adult) (pediatric): Secondary | ICD-10-CM | POA: Insufficient documentation

## 2023-07-26 DIAGNOSIS — I5022 Chronic systolic (congestive) heart failure: Secondary | ICD-10-CM

## 2023-07-26 DIAGNOSIS — I110 Hypertensive heart disease with heart failure: Secondary | ICD-10-CM | POA: Diagnosis not present

## 2023-07-26 DIAGNOSIS — I480 Paroxysmal atrial fibrillation: Secondary | ICD-10-CM | POA: Diagnosis not present

## 2023-07-26 LAB — HEMOGLOBIN A1C
Hgb A1c MFr Bld: 6 % — ABNORMAL HIGH (ref 4.8–5.6)
Mean Plasma Glucose: 125.5 mg/dL

## 2023-07-26 LAB — BASIC METABOLIC PANEL
Anion gap: 10 (ref 5–15)
BUN: 13 mg/dL (ref 8–23)
CO2: 27 mmol/L (ref 22–32)
Calcium: 9.2 mg/dL (ref 8.9–10.3)
Chloride: 100 mmol/L (ref 98–111)
Creatinine, Ser: 1.26 mg/dL — ABNORMAL HIGH (ref 0.44–1.00)
GFR, Estimated: 45 mL/min — ABNORMAL LOW (ref >60)
Glucose, Bld: 145 mg/dL — ABNORMAL HIGH (ref 70–99)
Potassium: 3.8 mmol/L (ref 3.5–5.1)
Sodium: 137 mmol/L (ref 135–145)

## 2023-07-26 NOTE — Telephone Encounter (Signed)
Advanced Heart Failure Patient Advocate Encounter  The patient was approved for a [employer] grant that will help cover the cost of [name], Coreg and Entresto. Total amount awarded, $10,000. Eligibility, 06/26/23 - 06/24/24.  ID [id number]  BIN [id number]  PCN [id number]  Group [id number]  Patient was provided copy while in clinic.  Archer Asa, CPhT

## 2023-07-26 NOTE — Patient Instructions (Addendum)
Thank you for coming in today  If you had labs drawn today, any labs that are abnormal the clinic will call you No news is good news  Medications:  You have been referred to pharmacy regarding Semaglutide or Tirzepatide, their office will call you for further appointment details    Follow up appointments:  Your physician recommends that you schedule a follow-up appointment in:  3 months in clinic   Do the following things EVERYDAY: Weigh yourself in the morning before breakfast. Write it down and keep it in a log. Take your medicines as prescribed Eat low salt foods--Limit salt (sodium) to 2000 mg per day.  Stay as active as you can everyday Limit all fluids for the day to less than 2 liters   At the Advanced Heart Failure Clinic, you and your health needs are our priority. As part of our continuing mission to provide you with exceptional heart care, we have created designated Provider Care Teams. These Care Teams include your primary Cardiologist (physician) and Advanced Practice Providers (APPs- Physician Assistants and Nurse Practitioners) who all work together to provide you with the care you need, when you need it.   You may see any of the following providers on your designated Care Team at your next follow up: Dr Arvilla Meres Dr Marca Ancona Dr. Marcos Eke, NP Robbie Lis, [place] [facility] [place], [place] Brynda Peon, NP Karle Plumber, PharmD   Please be sure to bring in all your medications bottles to every appointment.    Thank you for choosing [facility]  If you have any questions or concerns before your next appointment please send [place] a message through [facility] or call our office at [phone].    TO LEAVE A MESSAGE FOR THE NURSE SELECT OPTION 2, PLEASE LEAVE A MESSAGE INCLUDING: YOUR NAME DATE OF BIRTH CALL BACK NUMBER REASON FOR CALL**this is important as we prioritize the call  backs  YOU WILL RECEIVE A CALL BACK THE SAME DAY AS LONG AS YOU CALL BEFORE 4:00 PM

## 2023-07-27 ENCOUNTER — Other Ambulatory Visit (HOSPITAL_COMMUNITY): Payer: Self-pay

## 2023-07-28 ENCOUNTER — Telehealth: Payer: Self-pay | Admitting: Pharmacist

## 2023-07-28 NOTE — Telephone Encounter (Signed)
Pt has prediabetes but not diabetes so insurance will not cover any GLPs under diabetic indication. Has [employer]vantage which does not cover weight loss GLPs. They are only covering [facility] for very specific criteria reflecting CV trial inclusion criteria (MI, stroke, or symptomatic PAD) which she does not meet unfortunately.  Pt aware her insurance will not cover GLP. Only option would be cash card for Zepbound which ranges from $399-$649/month depending on dose and formulation which is cost prohibitive. She was appreciative for the call.

## 2023-07-28 NOTE — Telephone Encounter (Signed)
-----   Message from [place] sent at 07/26/2023  4:41 PM EDT ----- Regarding: FW: pharmD referral  ----- Message ----- From: Demetrius Charity, RN Sent: 07/26/2023   4:16 PM EDT To: Awilda Metro, RPH-CPP Subject: pharmD referral                                Greetings,  Pt was seen in clinic today [place] NP wants her to be referred for [facility] or Tirzepatide  Any questions please let me know. Thanks,  Astronomer

## 2023-08-07 ENCOUNTER — Ambulatory Visit: Payer: PPO | Attending: Cardiology

## 2023-08-07 DIAGNOSIS — I5022 Chronic systolic (congestive) heart failure: Secondary | ICD-10-CM

## 2023-08-07 DIAGNOSIS — Z95810 Presence of automatic (implantable) cardiac defibrillator: Secondary | ICD-10-CM

## 2023-08-11 ENCOUNTER — Telehealth: Payer: Self-pay

## 2023-08-11 NOTE — Progress Notes (Signed)
EPIC Encounter for ICM Monitoring  Patient Name: [name] is a 72 y.o. female Date: 08/11/2023 Primary Care Physican: Gordan Payment., MD Primary Cardiologist: Bensimhon Electrophysiologist: Elberta Fortis Bi-V Pacing: 9%           09/28/2022 Weight: 285 lbs 12/14/2022 Weight: 297 lbs 01/16/2023 Weight: 297 lbs 05/03/2023 Weight: 281.6 lbs         Attempted call to patient and unable to reach.  Left detailed message per DPR regarding transmission. Transmission reviewed.    Corvue thoracic impedance suggesting possible fluid accumulation from 9/27-9/29.   Prescribed:  Furosemide 40 mg 1 tablet by mouth daily.  Spironolactone 25 mg take 1 tablet daily.       Labs: 07/26/2023 Creatinine 1.26, BUN 13, Potassium 3.8, Sodium 137, GFR 46 04/25/2023 Creatinine 1.29, BUN 25, Potassium 3.8, Sodium 136, GFR 44 A complete set of results can be found in Results Review.   Recommendations:  Left voice mail with ICM number and encouraged to call if experiencing any fluid symptoms.     Follow-up plan: ICM clinic phone appointment on 09/11/2023.  91 day device clinic remote transmission 08/22/2023.    EP/Cardiology Office Visits:  10/18/2023 with HF Clinic.  08/23/2023 with Dr Elberta Fortis.     Copy of ICM check sent to Dr. Elberta Fortis.  3 month ICM trend: 08/08/2023.    12-14 Month ICM trend:     Karie Soda, RN 08/11/2023 1:20 PM

## 2023-08-11 NOTE — Telephone Encounter (Signed)
Remote ICM transmission received.  Attempted call to patient regarding ICM remote transmission and left detailed message per DPR.  Left ICM phone number and advised to return call for any fluid symptoms or questions. Next ICM remote transmission scheduled 09/11/2023.

## 2023-08-22 ENCOUNTER — Ambulatory Visit (INDEPENDENT_AMBULATORY_CARE_PROVIDER_SITE_OTHER): Payer: PPO

## 2023-08-22 DIAGNOSIS — I5022 Chronic systolic (congestive) heart failure: Secondary | ICD-10-CM

## 2023-08-22 DIAGNOSIS — I428 Other cardiomyopathies: Secondary | ICD-10-CM | POA: Diagnosis not present

## 2023-08-23 ENCOUNTER — Ambulatory Visit: Payer: PPO | Attending: Cardiology | Admitting: Cardiology

## 2023-08-23 ENCOUNTER — Encounter: Payer: Self-pay | Admitting: Cardiology

## 2023-08-23 VITALS — BP 124/70 | HR 72 | Ht 61.5 in | Wt 272.3 lb

## 2023-08-23 DIAGNOSIS — I480 Paroxysmal atrial fibrillation: Secondary | ICD-10-CM | POA: Diagnosis not present

## 2023-08-23 DIAGNOSIS — I428 Other cardiomyopathies: Secondary | ICD-10-CM | POA: Diagnosis not present

## 2023-08-23 DIAGNOSIS — I5022 Chronic systolic (congestive) heart failure: Secondary | ICD-10-CM

## 2023-08-23 LAB — CUP PACEART REMOTE DEVICE CHECK
Battery Remaining Longevity: 58 mo
Battery Remaining Percentage: 89 %
Battery Voltage: 3.07 V
Brady Statistic AP VP Percent: 6.6 %
Brady Statistic AP VS Percent: 1 %
Brady Statistic AS VP Percent: 90 %
Brady Statistic AS VS Percent: 1.2 %
Brady Statistic RA Percent Paced: 5.4 %
Date Time Interrogation Session: 20241022020024
HighPow Impedance: 97 Ohm
HighPow Impedance: 97 Ohm
Implantable Lead Connection Status: 753985
Implantable Lead Connection Status: 753985
Implantable Lead Connection Status: 753985
Implantable Lead Implant Date: 20120914
Implantable Lead Implant Date: 20120914
Implantable Lead Implant Date: 20121116
Implantable Lead Location: 753858
Implantable Lead Location: 753859
Implantable Lead Location: 753860
Implantable Lead Model: 511212
Implantable Lead Serial Number: 194059
Implantable Pulse Generator Implant Date: 20240722
Lead Channel Impedance Value: 280 Ohm
Lead Channel Impedance Value: 400 Ohm
Lead Channel Impedance Value: 400 Ohm
Lead Channel Pacing Threshold Amplitude: 0.75 V
Lead Channel Pacing Threshold Amplitude: 1.375 V
Lead Channel Pacing Threshold Amplitude: 1.5 V
Lead Channel Pacing Threshold Pulse Width: 0.5 ms
Lead Channel Pacing Threshold Pulse Width: 0.5 ms
Lead Channel Pacing Threshold Pulse Width: 0.7 ms
Lead Channel Sensing Intrinsic Amplitude: 0.8 mV
Lead Channel Sensing Intrinsic Amplitude: 11.6 mV
Lead Channel Setting Pacing Amplitude: 1.75 V
Lead Channel Setting Pacing Amplitude: 2.375
Lead Channel Setting Pacing Amplitude: 2.75 V
Lead Channel Setting Pacing Pulse Width: 0.5 ms
Lead Channel Setting Pacing Pulse Width: 0.7 ms
Lead Channel Setting Sensing Sensitivity: 0.5 mV
Pulse Gen Serial Number: 5552877

## 2023-08-23 NOTE — Progress Notes (Signed)
  Electrophysiology Office Note:   Date:  08/23/2023  ID:  [name], DOB 1950-12-08, MRN [id number]  Primary Cardiologist: None Electrophysiologist: Daxten Kovalenko Jorja Loa, MD      History of Present Illness:   [name] is a 72 y.o. female with h/o chronic systolic heart failure seen today for routine electrophysiology followup.   Since last being seen in our clinic the patient reports doing.  She has no acute complaints.  She has not had any issues since her generator change.  she denies chest pain, palpitations, dyspnea, PND, orthopnea, nausea, vomiting, dizziness, syncope, edema, weight gain, or early satiety.   Review of systems complete and found to be negative unless listed in HPI.      EP Information / Studies Reviewed:    EKG is ordered today. Personal review as below.  EKG Interpretation Date/Time:  Wednesday August 23 2023 10:48:47 EDT Ventricular Rate:  72 PR Interval:  198 QRS Duration:  174 QT Interval:  478 QTC Calculation: 523 R Axis:   -11  Text Interpretation: Sinus rhythm VENTRICULAR PACING with occasional Premature ventricular complexes Left bundle branch block When compared with ECG of 24-Apr-2023 16:31, No significant change since last tracing Confirmed by Jonerik Sliker ([id number]) on 08/23/2023 10:53:31 AM   ICD Interrogation-  reviewed in detail today,  See PACEART report.  Device History: Abbott BiV ICD implanted 2012 for CHF History of appropriate therapy: No History of AAD therapy: No   Risk Assessment/Calculations:              Physical Exam:   VS:  BP 124/70 (BP Location: Left Arm, Patient Position: Sitting, Cuff Size: Large)   Pulse 72   Ht 5' 1.5" (1.562 m)   Wt 272 lb 4.8 oz (123.5 kg)   SpO2 94%   BMI 50.62 kg/m    Wt Readings from Last 3 Encounters:  08/23/23 272 lb 4.8 oz (123.5 kg)  07/26/23 274 lb 2 oz (124.3 kg)  05/22/23 280 lb (127 kg)     GEN: Well nourished, well developed in no acute distress NECK: No JVD; No  carotid bruits CARDIAC: Regular rate and rhythm, no murmurs, rubs, gallops RESPIRATORY:  Clear to auscultation without rales, wheezing or rhonchi  ABDOMEN: Soft, non-tender, non-distended EXTREMITIES:  No edema; No deformity   ASSESSMENT AND PLAN:    Chronic systolic dysfunction s/p Abbott CRT-D  euvolemic today Stable on an appropriate medical regimen Normal ICD function See Pace Art report No changes today  2.  Paroxysmal atrial fibrillation: 1 event noted in 2012 when the patient was postop.  No anticoagulation at this time.  3.  Type B aortic dissection: Treated medically per primary cardiology  Disposition:   Follow up with EP APP in 12 months   Signed, Ethelle Ola Jorja Loa, MD

## 2023-08-23 NOTE — Patient Instructions (Signed)
Medication Instructions:  Your physician recommends that you continue on your current medications as directed. Please refer to the Current Medication list given to you today.  *If you need a refill on your cardiac medications before your next appointment, please call your pharmacy*   Lab Work: None ordered   Testing/Procedures: None ordered   Follow-Up: At [facility], you and your health needs are our priority.  As part of our continuing mission to provide you with exceptional heart care, we have created designated Provider Care Teams.  These Care Teams include your primary Cardiologist (physician) and Advanced Practice Providers (APPs -  Physician Assistants and Nurse Practitioners) who all work together to provide you with the care you need, when you need it.  Your next appointment:   1 year(s)  The format for your next appointment:   In Person  Provider:   Dr. Elberta Fortis   Thank you for choosing [facility]!!   Dory Horn, RN [phone]

## 2023-09-04 LAB — CUP PACEART INCLINIC DEVICE CHECK
Battery Remaining Longevity: 54 mo
Brady Statistic RA Percent Paced: 5.3 %
Brady Statistic RV Percent Paced: 97 %
Date Time Interrogation Session: 20241023111702
HighPow Impedance: 99 Ohm
Implantable Lead Connection Status: 753985
Implantable Lead Connection Status: 753985
Implantable Lead Connection Status: 753985
Implantable Lead Implant Date: 20120914
Implantable Lead Implant Date: 20120914
Implantable Lead Implant Date: 20121116
Implantable Lead Location: 753858
Implantable Lead Location: 753859
Implantable Lead Location: 753860
Implantable Lead Model: 511212
Implantable Lead Serial Number: 194059
Implantable Pulse Generator Implant Date: 20240722
Lead Channel Impedance Value: 275 Ohm
Lead Channel Impedance Value: 425 Ohm
Lead Channel Impedance Value: 437.5 Ohm
Lead Channel Pacing Threshold Amplitude: 0.75 V
Lead Channel Pacing Threshold Amplitude: 0.75 V
Lead Channel Pacing Threshold Amplitude: 1 V
Lead Channel Pacing Threshold Amplitude: 1 V
Lead Channel Pacing Threshold Amplitude: 1.5 V
Lead Channel Pacing Threshold Amplitude: 1.5 V
Lead Channel Pacing Threshold Pulse Width: 0.5 ms
Lead Channel Pacing Threshold Pulse Width: 0.5 ms
Lead Channel Pacing Threshold Pulse Width: 0.5 ms
Lead Channel Pacing Threshold Pulse Width: 0.5 ms
Lead Channel Pacing Threshold Pulse Width: 0.7 ms
Lead Channel Pacing Threshold Pulse Width: 0.7 ms
Lead Channel Sensing Intrinsic Amplitude: 0.6 mV
Lead Channel Sensing Intrinsic Amplitude: 11.6 mV
Lead Channel Setting Pacing Amplitude: 1.75 V
Lead Channel Setting Pacing Amplitude: 2.125
Lead Channel Setting Pacing Amplitude: 2.75 V
Lead Channel Setting Pacing Pulse Width: 0.5 ms
Lead Channel Setting Pacing Pulse Width: 0.7 ms
Lead Channel Setting Sensing Sensitivity: 0.5 mV
Pulse Gen Serial Number: 5552877

## 2023-09-08 NOTE — Progress Notes (Signed)
Remote ICD transmission.   

## 2023-09-11 ENCOUNTER — Ambulatory Visit: Payer: PPO | Attending: Cardiology

## 2023-09-11 DIAGNOSIS — I5022 Chronic systolic (congestive) heart failure: Secondary | ICD-10-CM

## 2023-09-11 DIAGNOSIS — Z95810 Presence of automatic (implantable) cardiac defibrillator: Secondary | ICD-10-CM

## 2023-09-13 NOTE — Progress Notes (Signed)
EPIC Encounter for ICM Monitoring  Patient Name: [name] is a 72 y.o. female Date: 09/13/2023 Primary Care Physican: Gordan Payment., MD Primary Cardiologist: Bensimhon Electrophysiologist: Elberta Fortis Bi-V Pacing: 97%           09/28/2022 Weight: 285 lbs 12/14/2022 Weight: 297 lbs 01/16/2023 Weight: 297 lbs 05/03/2023 Weight: 281.6 lbs 08/23/2023 Office Weight: 273 lbs         Transmission reviewed.    Corvue thoracic impedance suggesting normal fluid levels with possible days of dryness in the last month.   Prescribed:  Furosemide 40 mg 1 tablet by mouth daily.  Spironolactone 25 mg take 1 tablet daily.       Labs: 07/26/2023 Creatinine 1.26, BUN 13, Potassium 3.8, Sodium 137, GFR 46 04/25/2023 Creatinine 1.29, BUN 25, Potassium 3.8, Sodium 136, GFR 44 A complete set of results can be found in Results Review.   Recommendations:  No changes.   Follow-up plan: ICM clinic phone appointment on 10/30/2023.  91 day device clinic remote transmission 11/21/2023.    EP/Cardiology Office Visits:  10/18/2023 with HF Clinic.  Recall 08/17/2024 with Dr Elberta Fortis.     Copy of ICM check sent to Dr. Elberta Fortis.  3 month ICM trend: 09/11/2023.    12-14 Month ICM trend:     Karie Soda, RN 09/13/2023 4:30 PM

## 2023-09-19 ENCOUNTER — Encounter: Payer: Self-pay | Admitting: Family Medicine

## 2023-09-24 ENCOUNTER — Other Ambulatory Visit (HOSPITAL_COMMUNITY): Payer: Self-pay | Admitting: Internal Medicine

## 2023-10-18 ENCOUNTER — Encounter (HOSPITAL_COMMUNITY): Payer: PPO

## 2023-10-24 ENCOUNTER — Encounter (HOSPITAL_COMMUNITY): Payer: PPO

## 2023-10-26 ENCOUNTER — Ambulatory Visit (HOSPITAL_COMMUNITY)
Admission: RE | Admit: 2023-10-26 | Discharge: 2023-10-26 | Disposition: A | Discharge status: Home / Self Care | Payer: PPO | Source: Ambulatory Visit | Attending: Internal Medicine | Admitting: Internal Medicine

## 2023-10-26 ENCOUNTER — Encounter (HOSPITAL_COMMUNITY): Payer: Self-pay

## 2023-10-26 VITALS — BP 123/102 | HR 83 | Ht 61.5 in | Wt 271.4 lb

## 2023-10-26 DIAGNOSIS — I428 Other cardiomyopathies: Secondary | ICD-10-CM | POA: Diagnosis not present

## 2023-10-26 DIAGNOSIS — Z79899 Other long term (current) drug therapy: Secondary | ICD-10-CM | POA: Insufficient documentation

## 2023-10-26 DIAGNOSIS — I7100 Dissection of unspecified site of aorta: Secondary | ICD-10-CM | POA: Diagnosis not present

## 2023-10-26 DIAGNOSIS — I5022 Chronic systolic (congestive) heart failure: Secondary | ICD-10-CM | POA: Insufficient documentation

## 2023-10-26 DIAGNOSIS — Z923 Personal history of irradiation: Secondary | ICD-10-CM | POA: Diagnosis not present

## 2023-10-26 DIAGNOSIS — Z9221 Personal history of antineoplastic chemotherapy: Secondary | ICD-10-CM | POA: Insufficient documentation

## 2023-10-26 DIAGNOSIS — Z853 Personal history of malignant neoplasm of breast: Secondary | ICD-10-CM | POA: Insufficient documentation

## 2023-10-26 DIAGNOSIS — Z95810 Presence of automatic (implantable) cardiac defibrillator: Secondary | ICD-10-CM | POA: Diagnosis not present

## 2023-10-26 DIAGNOSIS — I447 Left bundle-branch block, unspecified: Secondary | ICD-10-CM | POA: Insufficient documentation

## 2023-10-26 DIAGNOSIS — Z6841 Body Mass Index (BMI) 40.0 and over, adult: Secondary | ICD-10-CM | POA: Insufficient documentation

## 2023-10-26 DIAGNOSIS — Z7982 Long term (current) use of aspirin: Secondary | ICD-10-CM | POA: Insufficient documentation

## 2023-10-26 DIAGNOSIS — I480 Paroxysmal atrial fibrillation: Secondary | ICD-10-CM | POA: Diagnosis not present

## 2023-10-26 LAB — BASIC METABOLIC PANEL
Anion gap: 9 (ref 5–15)
BUN: 12 mg/dL (ref 8–23)
CO2: 27 mmol/L (ref 22–32)
Calcium: 8.9 mg/dL (ref 8.9–10.3)
Chloride: 102 mmol/L (ref 98–111)
Creatinine, Ser: 1.12 mg/dL — ABNORMAL HIGH (ref 0.44–1.00)
GFR, Estimated: 52 mL/min — ABNORMAL LOW (ref >60)
Glucose, Bld: 96 mg/dL (ref 70–99)
Potassium: 3.6 mmol/L (ref 3.5–5.1)
Sodium: 138 mmol/L (ref 135–145)

## 2023-10-26 LAB — BRAIN NATRIURETIC PEPTIDE: B Natriuretic Peptide: 210.6 pg/mL — ABNORMAL HIGH (ref 0.0–100.0)

## 2023-10-26 NOTE — Patient Instructions (Signed)
Medication Changes:  INCREASE LASIX (FUROSEMIDE) TO 60MG  FOR THE NEXT 2 DAYS   THEN RETURN TO 40MG  ONCE DAILY   Lab Work:  Labs done today, your results will be available in MyChart, we will contact you for abnormal readings.  Referrals:  YOU HAVE BEEN REFERRED TO [facility]MD AT [facility] THEY WILL REACH OUT TO YOU OR CALL TO ARRANGE THIS. PLEASE CALL us WITH ANY CONCERNS   Follow-Up in: 3 MONTHS AS SCHEDULED WITH DR. Gala Romney   At the [facility], you and your health needs are our priority. We have a designated team specialized in the treatment of Heart Failure. This Care Team includes your primary Heart Failure Specialized Cardiologist (physician), Advanced Practice Providers (APPs- Physician Assistants and Nurse Practitioners), and Pharmacist who all work together to provide you with the care you need, when you need it.   You may see any of the following providers on your designated Care Team at your next follow up:  Dr. Arvilla Meres Dr. Marca Ancona Dr. Dorthula Nettles Dr. Theresia Bough Tonye Becket, NP Robbie Lis, [place] [facility] [place], [place] Brynda Peon, NP [place] Lee, NP Karle Plumber, PharmD   Please be sure to bring in all your medications bottles to every appointment.   Need to Contact us:  If you have any questions or concerns before your next appointment please send [place] a message through [place] or call our office at [phone].    TO LEAVE A MESSAGE FOR THE NURSE SELECT OPTION 2, PLEASE LEAVE A MESSAGE INCLUDING: YOUR NAME DATE OF BIRTH CALL BACK NUMBER REASON FOR CALL**this is important as we prioritize the call backs  YOU WILL RECEIVE A CALL BACK THE SAME DAY AS LONG AS YOU CALL BEFORE 4:00 PM

## 2023-10-26 NOTE — Progress Notes (Signed)
[facility] Note  Oncologist: Dr. Amie Critchley at [facility] EP: Dr. Johney Frame PCP: Gordan Payment., MD HF: Bensimhon  Chief Complaint: Heart Failure follow up  HPI: [name] is a 72 y.o. woman with a history of HTN, Type B aortic dissection (2010) managed with medical therapy by Dr. Donata Clay and has healed, LBBB, systolic heart failure due to NICM with EF 10-15% Cath Dec 2011 showed normal arteries.    She was diagnosed with breast cancer in 2009 s/p lumpectomy, XRT, and chemotherapy.  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   She underwent St Jude Bi-V ICD implantation Sept 2012 but unable to place LV lead.  She eventually brought back in Nov 2012 for left thoracotomy and placement of epicardial LV lead placement by Dr. Donata Clay.  She also had a sleep study that showed mild OSA.  No clinical intervention occurred.    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 Started PPI and famotidine. Question also raised about possible trial off ARNI.   Follow up 5/22 , stable NYHA II-III. Echo  EF 20-25%, RV ok. Screened for [employer].  Echo 11/23 EF 20-25% RV ok  Follow up 6/24, NYHA III-IIIb. Volume ok, Coreg decreased to 6.25 and Entresto to 24/26 due to low BP.  S/p gen change 7/24.  Today she returns for AHF follow up with her husband. Overall feeling good. Denies palpitations, CP, dizziness, or PND/Orthopnea. No SOB. Chronic bilateral ankle edema. Appetite good. No fever or chills. Weight at home 272 pounds. Taking all medications.  Cardiac Studies: - Echo (11/23): EF 20-25%, RV ok - Echo (5/22): EF 20-25% RV ok  - Echo (9/21): EF 20-25% with dyssynchrony RV ok  - Echo (3/21): EF 10% with severe dyssynchrony. RV ok  - Echo (2/19): EF 20%, RV ok.  - CPX (8/16): Peak VO2 10.1 VE/VCO2 slope 28.4 RER 1.14 OUES 1.3 PFTs mildly restrictive No significant  change compared to prior.  Limited more by obesity and restrictive lung physiology than heart (mild circulatory limitation).   - CPX (4/15): Peak VO2: 10.3 (66.4% predicted peak VO2) VE/VCO2 slope: 25.0 OUES: 1.30 Peak RER: 1.20  - CPX (12/13) Peak VO2: 10.6 % predicted peak VO2: 64.4% VE/VCO2 slope: 26.9 OUES: 1.18 Peak RER: 1.29 Ventilatory Threshold: 7.3 % predicted peak VO2: 44.4% Peak RR 23 Peak Ventilation: 29.8 VE/MVV: 32.6% PETCO2 at peak: 43  Echo 12/17 EF 10-15%, Grade 1 DD, mild PI, Mild TR. Normal RV. Echo 12/16 EF 15%  Echo 12/15 EF 15-20% RV ok Echo 9/14 EF 20% RV ok Echo 3/14 EF 15% Grade 1 diastolic dysfunction. RV ok. No PAH  Review of systems complete and found to be negative unless listed in HPI.      Past Medical History:  Diagnosis Date   Aortic dissection (HCC) 12/2008   Type B   Arthritis    lower back   Atrial fibrillation (HCC)    post op (left thoracotomy) 11/12 req amio/DCCV   Breast cancer (HCC) 07/2009   Stage 3 lumpectomy, radiation, chemotherapy; finished chemo October, felt to be in remission   Cardiac defibrillator in place    St. Jude (JA) 9/12; LV lead placement unsuccessful;  s/p left thoracotomy with placement of epicardial LV lead 09/2011 (Dr. PVT)   CHF (congestive heart failure) (HCC)    Chronic systolic dysfunction of left ventricle    Depression  DVT (deep venous thrombosis) (HCC) 10/18/2010   GERD (gastroesophageal reflux disease)    Glaucoma    Hiatal hernia    small hiatal hernia   History of shingles    Hyperlipidemia    Hypertension    Left bundle branch block    Nonischemic cardiomyopathy (HCC)    Admission 12/11 with EF 25%, normal coronary arteries by cath 12/11; NICM presumed to be secondary to chemotherapy for breast cancer   Obesity    [name]    with exertion   Current Outpatient Medications  Medication Sig Dispense Refill   allopurinol (ZYLOPRIM) 300 MG tablet Take 300 mg by mouth daily.      aspirin 81 MG EC tablet Take 81 mg by mouth daily.     bimatoprost (LUMIGAN) 0.03 % ophthalmic solution Place 1 drop into both eyes at bedtime. Reported on 01/12/2016     Calcium Carbonate-Vitamin D (CALCIUM-VITAMIN D3) 600-200 MG-UNIT TABS Take 1 tablet by mouth 2 (two) times daily.     carvedilol (COREG) 6.25 MG tablet Take 1 tablet (6.25 mg total) by mouth 2 (two) times daily. 180 tablet 3   cetirizine (ZYRTEC) 10 MG tablet Take 10 mg by mouth at bedtime.     Cholecalciferol (VITAMIN D) 125 MCG (5000 UT) CAPS Take 5,000 Units by mouth daily.     clotrimazole-betamethasone (LOTRISONE) cream Apply 1 Application topically 2 (two) times daily as needed (Rash).     COLCRYS 0.6 MG tablet TAKE ONE TABLET BY MOUTH EVERY DAY 30 tablet 1   cyanocobalamin (VITAMIN B12) 1000 MCG tablet Take 1,000 mcg by mouth daily.     cyclobenzaprine (FLEXERIL) 10 MG tablet Take 1 tablet (10 mg total) by mouth 3 (three) times daily as needed for muscle spasms. 30 tablet 0   dextromethorphan (DELSYM) 30 MG/5ML liquid Take 5 mLs by mouth at bedtime.     diclofenac sodium (VOLTAREN) 1 % GEL APPLY THREE GRAMS TO THREE LARGE JOINTS UP TO THREE TIMES DAILY AS NEEDED 3 Tube 3   docusate sodium (COLACE) 100 MG capsule Take 100 mg by mouth 2 (two) times daily as needed for mild constipation.     famotidine (PEPCID) 20 MG tablet Take 20 mg by mouth 2 (two) times daily.     fluticasone (FLONASE) 50 MCG/ACT nasal spray Place 1 spray into both nostrils 2 (two) times daily.     furosemide (LASIX) 40 MG tablet Take 1 tablet (40 mg total) by mouth daily. 30 tablet 1   ivabradine ([name]) 5 MG TABS tablet Take 0.5 tablets (2.5 mg total) by mouth 2 (two) times daily. If hr greater than 70 90 tablet 3   levothyroxine (SYNTHROID) 25 MCG tablet Take 25 mcg by mouth daily.     montelukast (SINGULAIR) 10 MG tablet Take 10 mg by mouth at bedtime. For allergies & congestion     Multiple Vitamins-Minerals (OCUVITE PO) Take 5 mg by mouth  daily.     omeprazole (PRILOSEC) 40 MG capsule TAKE ONE CAPSULE BY MOUTH TWICE DAILY 60 capsule 8   PARoxetine (PAXIL) 30 MG tablet Take 30 mg by mouth at bedtime.  1   Polyethyl Glycol-Propyl Glycol (SYSTANE OP) Place 1 drop into both eyes daily as needed (dry eyes).     rosuvastatin (CRESTOR) 5 MG tablet TAKE ONE TABLET BY MOUTH EVERY EVENING 90 tablet 3   sacubitril-valsartan (ENTRESTO) 24-26 MG Take 1 tablet by mouth 2 (two) times daily. 60 tablet 11   sodium chloride (OCEAN)  0.65 % SOLN nasal spray Place 1 spray into both nostrils daily as needed for congestion.     sodium fluoride (DENTA 5000 PLUS) 1.1 % CREA dental cream Place 1 Application onto teeth 2 (two) times daily.     spironolactone (ALDACTONE) 25 MG tablet TAKE ONE TABLET BY MOUTH daily 90 tablet 3   traMADol (ULTRAM) 50 MG tablet Take 50 mg by mouth every 6 (six) hours as needed for pain.     vitamin E 1000 UNIT capsule Take 1,000 Units by mouth daily.     No current facility-administered medications for this encounter.   BP (!) 123/102   Pulse 83   Ht 5' 1.5" (1.562 m)   Wt 123.1 kg (271 lb 6.4 oz)   SpO2 93%   BMI 50.45 kg/m   Wt Readings from Last 3 Encounters:  10/26/23 123.1 kg (271 lb 6.4 oz)  08/23/23 123.5 kg (272 lb 4.8 oz)  07/26/23 124.3 kg (274 lb 2 oz)   Physical Exam  General:  well appearing.  No respiratory difficulty HEENT: normal Neck: supple. JVD ~8 cm. Carotids 2+ bilat; no bruits. No lymphadenopathy or thyromegaly appreciated. Cor: PMI nondisplaced. Regular rate & rhythm. No rubs, gallops or murmurs. Lungs: clear Abdomen: obese, soft, nontender, nondistended. No hepatosplenomegaly. No bruits or masses. Good bowel sounds. Extremities: no cyanosis, clubbing, rash, non-pitting BLE edema  Neuro: alert & oriented x 3, cranial nerves grossly intact. moves all 4 extremities w/o difficulty. Affect pleasant.   Device interrogation (personally reviewed): No VT/AF. 95% biv pacing, volume slightly  elevated by CorVue  EKG today a sensed v paced 91 bpm (Personally reviewed)    ASSESSMENT & PLAN: 1. Chronic systolic CHF: Nonischemic cardiomyopathy.   - Echo (12/17): EF 10-15% s/p St Jude Bi-V ICD with epicardial LV lead.  - Echo (2/19): EF 20% stable. - CPX in (8/16) was similar to the past: it appeared that the major limitation was from body habitus/deconditioning and not cardiac - Echo (3/21): EF 10-15% severe dyssynchrony, RV ok  - Underwent AV optimization of CRT in 4/21 QRS 170 -> - Echo (9/21): EF 20-25% with dyssynchrony RV ok  - Echo (5/22): EF 20-25% RV ok  - Echo (11/23): EF 20-25% RV ok  - Stable NYHA III, volume slightly elevated on exam and CorVue - Continue Entresto 24/26 bid - Increase Lasix 40>60 mg daily for 3 days then back to 40 mg daily. Knows when to take PRN.  - Continue carvedilol 6.25 mg bid.  - Continue spironolactone 25 mg daily.  - No SGLT2i due to concern for UTI. - Continue [name] 2.5 mg bid - (no Batwire with CRT-D). - BMET/CBC  2. Paroxysmal atrial fibrillation  - Regular on exam. No AF on device - Continue ASA 81 mg daily.  - If recurs, will need antiocoag with CHA2DS2/VAS of 4.   3. Type B aortic dissection  - Stable  4. Morbid obesity - Body mass index is 50.45 kg/m. - Consider GLP1RA. A1C 6. Will refer to PharmD.   Follow up in 3 months with Dr. Gala Romney.    Alen Bleacher, NP  3:18 PM

## 2023-10-30 ENCOUNTER — Ambulatory Visit: Payer: PPO | Attending: Cardiology

## 2023-10-30 DIAGNOSIS — I5022 Chronic systolic (congestive) heart failure: Secondary | ICD-10-CM | POA: Diagnosis not present

## 2023-10-30 DIAGNOSIS — Z95810 Presence of automatic (implantable) cardiac defibrillator: Secondary | ICD-10-CM | POA: Diagnosis not present

## 2023-11-06 ENCOUNTER — Other Ambulatory Visit (HOSPITAL_COMMUNITY): Payer: Self-pay | Admitting: Internal Medicine

## 2023-11-07 ENCOUNTER — Telehealth: Payer: Self-pay

## 2023-11-07 NOTE — Telephone Encounter (Signed)
 Called patient back to advise her remote transmission was normal. No alerts noted. Pt advised to follow up with PCP.

## 2023-11-07 NOTE — Telephone Encounter (Signed)
 The pt states she wanted the nurse to review her transmission to see if something was wrong with her ICD on Sunday. She was having Knee, and hand pains. Transmission received 11/07/2023. I told her the nurse will call her back.

## 2023-11-19 ENCOUNTER — Other Ambulatory Visit (HOSPITAL_COMMUNITY): Payer: Self-pay | Admitting: Internal Medicine

## 2023-11-21 ENCOUNTER — Ambulatory Visit (INDEPENDENT_AMBULATORY_CARE_PROVIDER_SITE_OTHER): Payer: PPO

## 2023-11-21 DIAGNOSIS — I5022 Chronic systolic (congestive) heart failure: Secondary | ICD-10-CM

## 2023-11-21 DIAGNOSIS — I428 Other cardiomyopathies: Secondary | ICD-10-CM

## 2023-11-21 LAB — CUP PACEART REMOTE DEVICE CHECK
Battery Remaining Longevity: 55 mo
Battery Remaining Percentage: 85 %
Battery Voltage: 3.02 V
Brady Statistic AP VP Percent: 5.6 %
Brady Statistic AP VS Percent: 1 %
Brady Statistic AS VP Percent: 89 %
Brady Statistic AS VS Percent: 2.5 %
Brady Statistic RA Percent Paced: 4.1 %
Date Time Interrogation Session: 20250121020018
HighPow Impedance: 100 Ohm
HighPow Impedance: 100 Ohm
Implantable Lead Connection Status: 753985
Implantable Lead Connection Status: 753985
Implantable Lead Connection Status: 753985
Implantable Lead Implant Date: 20120914
Implantable Lead Implant Date: 20120914
Implantable Lead Implant Date: 20121116
Implantable Lead Location: 753858
Implantable Lead Location: 753859
Implantable Lead Location: 753860
Implantable Lead Model: 511212
Implantable Lead Serial Number: 194059
Implantable Pulse Generator Implant Date: 20240722
Lead Channel Impedance Value: 280 Ohm
Lead Channel Impedance Value: 390 Ohm
Lead Channel Impedance Value: 390 Ohm
Lead Channel Pacing Threshold Amplitude: 0.75 V
Lead Channel Pacing Threshold Amplitude: 1.375 V
Lead Channel Pacing Threshold Amplitude: 1.5 V
Lead Channel Pacing Threshold Pulse Width: 0.5 ms
Lead Channel Pacing Threshold Pulse Width: 0.5 ms
Lead Channel Pacing Threshold Pulse Width: 0.7 ms
Lead Channel Sensing Intrinsic Amplitude: 0.8 mV
Lead Channel Sensing Intrinsic Amplitude: 11.6 mV
Lead Channel Setting Pacing Amplitude: 1.75 V
Lead Channel Setting Pacing Amplitude: 2.375
Lead Channel Setting Pacing Amplitude: 2.75 V
Lead Channel Setting Pacing Pulse Width: 0.5 ms
Lead Channel Setting Pacing Pulse Width: 0.7 ms
Lead Channel Setting Sensing Sensitivity: 0.5 mV
Pulse Gen Serial Number: 5552877

## 2023-11-24 ENCOUNTER — Telehealth: Payer: Self-pay | Admitting: *Deleted

## 2023-11-24 DIAGNOSIS — I428 Other cardiomyopathies: Secondary | ICD-10-CM

## 2023-11-24 DIAGNOSIS — I5022 Chronic systolic (congestive) heart failure: Secondary | ICD-10-CM

## 2023-11-24 NOTE — Telephone Encounter (Signed)
-----   Message from [facility] sent at 11/22/2023  7:36 AM EST ----- Abnormal device interrogation reviewed.  Lead parameters and battery status stable.  Significantly reduced biv pacing, needs 2 week monitor to determine cause

## 2023-11-24 NOTE — Telephone Encounter (Signed)
Informed patient of results and verbal understanding expressed. Aware monitor will be mailed to home address (verified). Aware I will send some information via mchart about this monitor.

## 2023-11-27 ENCOUNTER — Other Ambulatory Visit: Payer: Self-pay | Admitting: Cardiology

## 2023-11-27 ENCOUNTER — Ambulatory Visit: Payer: PPO

## 2023-11-27 ENCOUNTER — Ambulatory Visit: Payer: PPO | Attending: Cardiology

## 2023-11-27 DIAGNOSIS — I480 Paroxysmal atrial fibrillation: Secondary | ICD-10-CM

## 2023-11-27 DIAGNOSIS — Z95810 Presence of automatic (implantable) cardiac defibrillator: Secondary | ICD-10-CM

## 2023-11-27 DIAGNOSIS — I5022 Chronic systolic (congestive) heart failure: Secondary | ICD-10-CM

## 2023-11-27 DIAGNOSIS — I428 Other cardiomyopathies: Secondary | ICD-10-CM

## 2023-11-27 NOTE — Progress Notes (Unsigned)
Enrolled for [employer] to mail a ZIO XT long term holter monitor to the patients address on file.

## 2023-11-28 ENCOUNTER — Telehealth: Payer: Self-pay | Admitting: Cardiology

## 2023-11-28 NOTE — Telephone Encounter (Signed)
Spoke with patient, report that she got an email from [facility] that looked like spam so she didn't open it up. Patient wanted to touch base with our office since she is expecting a cardiac monitor in the mail, instructed patient that no instructions for the monitor would come thru email, all items needed would be in the box once it arrives. No further needs at this time

## 2023-11-28 NOTE — Telephone Encounter (Signed)
Patient is calling about information she received in the mail. She said instead she received spam stuff. Please advise

## 2023-12-01 ENCOUNTER — Encounter: Payer: Self-pay | Admitting: Internal Medicine

## 2023-12-04 ENCOUNTER — Ambulatory Visit: Payer: PPO | Attending: Cardiology

## 2023-12-04 DIAGNOSIS — Z95810 Presence of automatic (implantable) cardiac defibrillator: Secondary | ICD-10-CM | POA: Diagnosis not present

## 2023-12-04 DIAGNOSIS — I5022 Chronic systolic (congestive) heart failure: Secondary | ICD-10-CM

## 2023-12-06 ENCOUNTER — Telehealth: Payer: Self-pay

## 2023-12-06 NOTE — Telephone Encounter (Signed)
Remote ICM transmission received.  Attempted call to patient regarding ICM remote transmission and mail box is full. 

## 2023-12-07 ENCOUNTER — Ambulatory Visit: Payer: PPO | Attending: Internal Medicine | Admitting: Student

## 2023-12-07 ENCOUNTER — Encounter: Payer: Self-pay | Admitting: Pharmacist

## 2023-12-07 ENCOUNTER — Telehealth: Payer: Self-pay

## 2023-12-07 ENCOUNTER — Telehealth: Payer: Self-pay | Admitting: Pharmacist

## 2023-12-07 ENCOUNTER — Encounter: Payer: Self-pay | Admitting: Student

## 2023-12-07 ENCOUNTER — Other Ambulatory Visit (HOSPITAL_COMMUNITY): Payer: Self-pay

## 2023-12-07 VITALS — Ht 61.0 in | Wt 271.0 lb

## 2023-12-07 DIAGNOSIS — I5022 Chronic systolic (congestive) heart failure: Secondary | ICD-10-CM | POA: Diagnosis not present

## 2023-12-07 NOTE — Telephone Encounter (Signed)
 Pharmacy Patient Advocate Encounter   Received notification from Physician's Office that prior authorization for ZEPBOUND is required/requested.   Insurance verification completed.   The patient is insured through [facility] ADVANTAGE/RX ADVANCE .   Per test claim: PA required; PA submitted to above mentioned insurance via CoverMyMeds Key/confirmation #/EOC [id number] Status is pending

## 2023-12-07 NOTE — Progress Notes (Signed)
 Patient ID: [name]                 DOB: 05-20-1951                    MRN: [id number]     HPI: [name] is a 73 y.o. female patient referred to pharmacy clinic by Brittney Coe, NP  to initiate GLP1-RA therapy. PMH is significant for hypertension, CHF atrial fibrilation,HLD, Depression,OSA and obesity. Most recent BMI 50.45 kg/m .  Baseline weight and BMI: 271 lbs and 50.45 kg/m  Current weight and BMI:  271 lbs and 50.45 kg/m  Current meds that affect weight: none  Lost 20 lbs with healthy diet and cutting portion in half.  Weight   Diet:  Coke - I can per day  Other drink water and milk  Breakfast- muffins and milk Lunch- sandwiches - peanut butter banana  Dinner meat loaf, soups, chicken casseroles  Snacks: moon pie   Exercise: none   Family History:  Relation Problem Comments  Mother Metallurgist) Atrial fibrillation   Glaucoma   Hypertension     Father (Deceased) Hypertension     Brother Metallurgist) Hypertension     Brother Metallurgist) Hypertension     Maternal Grandfather (Deceased) Coronary artery disease   Heart attack MI  Hypertension     Paternal Grandfather (Deceased) Coronary artery disease   Heart attack MI  Hypertension      Social History:   Alcohol : none  Smoking: none   Labs: Lab Results  Component Value Date   HGBA1C 6.0 (H) 07/26/2023    Wt Readings from Last 1 Encounters:  10/26/23 271 lb 6.4 oz (123.1 kg)    BP Readings from Last 1 Encounters:  10/26/23 (!) 123/102   Pulse Readings from Last 1 Encounters:  10/26/23 83       Component Value Date/Time   CHOL  10/05/2010 0619    190        ATP III CLASSIFICATION:  <200     mg/dL   Desirable  799-760  mg/dL   Borderline High  >=759    mg/dL   High          TRIG 90 10/05/2010 0619   HDL 39 (L) 10/05/2010 0619   CHOLHDL 4.9 10/05/2010 0619   VLDL 18 10/05/2010 0619   LDLCALC (H) 10/05/2010 0619    133        Total Cholesterol/HDL:CHD Risk Coronary Heart Disease Risk Table                      Men   Women  1/2 Average Risk   3.4   3.3  Average Risk       5.0   4.4  2 X Average Risk   9.6   7.1  3 X Average Risk  23.4   11.0        Use the calculated Patient Ratio above and the CHD Risk Table to determine the patient's CHD Risk.        ATP III CLASSIFICATION (LDL):  <100     mg/dL   Optimal  899-870  mg/dL   Near or Above                    Optimal  130-159  mg/dL   Borderline  839-810  mg/dL   High  >809     mg/dL   Very High  Past Medical History:  Diagnosis Date   Aortic dissection (HCC) 12/2008   Type B   Arthritis    lower back   Atrial fibrillation (HCC)    post op (left thoracotomy) 11/12 req amio/DCCV   Breast cancer (HCC) 07/2009   Stage 3 lumpectomy, radiation, chemotherapy; finished chemo October, felt to be in remission   Cardiac defibrillator in place    St. Jude (JA) 9/12; LV lead placement unsuccessful;  s/p left thoracotomy with placement of epicardial LV lead 09/2011 (Dr. PVT)   CHF (congestive heart failure) (HCC)    Chronic systolic dysfunction of left ventricle    Depression    DVT (deep venous thrombosis) (HCC) 10/18/2010   GERD (gastroesophageal reflux disease)    Glaucoma    Hiatal hernia    small hiatal hernia   History of shingles    Hyperlipidemia    Hypertension    Left bundle branch block    Nonischemic cardiomyopathy (HCC)    Admission 12/11 with EF 25%, normal coronary arteries by cath 12/11; NICM presumed to be secondary to chemotherapy for breast cancer   Obesity    [name]    with exertion    Current Outpatient Medications on File Prior to Visit  Medication Sig Dispense Refill   allopurinol  (ZYLOPRIM ) 300 MG tablet Take 300 mg by mouth daily.     aspirin  81 MG EC tablet Take 81 mg by mouth daily.     bimatoprost  (LUMIGAN ) 0.03 % ophthalmic solution Place 1 drop into both eyes at bedtime. Reported on 01/12/2016     Calcium  Carbonate-Vitamin D (CALCIUM -VITAMIN D3) 600-200 MG-UNIT TABS Take  1 tablet by mouth 2 (two) times daily.     carvedilol  (COREG ) 6.25 MG tablet Take 1 tablet (6.25 mg total) by mouth 2 (two) times daily. 180 tablet 3   cetirizine (ZYRTEC) 10 MG tablet Take 10 mg by mouth at bedtime.     Cholecalciferol (VITAMIN D) 125 MCG (5000 UT) CAPS Take 5,000 Units by mouth daily.     clotrimazole-betamethasone (LOTRISONE) cream Apply 1 Application topically 2 (two) times daily as needed (Rash).     COLCRYS  0.6 MG tablet TAKE ONE TABLET BY MOUTH EVERY DAY 30 tablet 1   cyanocobalamin  (VITAMIN B12) 1000 MCG tablet Take 1,000 mcg by mouth daily.     cyclobenzaprine  (FLEXERIL ) 10 MG tablet Take 1 tablet (10 mg total) by mouth 3 (three) times daily as needed for muscle spasms. 30 tablet 0   dextromethorphan (DELSYM) 30 MG/5ML liquid Take 5 mLs by mouth at bedtime.     diclofenac  sodium (VOLTAREN ) 1 % GEL APPLY THREE GRAMS TO THREE LARGE JOINTS UP TO THREE TIMES DAILY AS NEEDED 3 Tube 3   docusate sodium  (COLACE) 100 MG capsule Take 100 mg by mouth 2 (two) times daily as needed for mild constipation.     famotidine  (PEPCID ) 20 MG tablet Take 20 mg by mouth 2 (two) times daily.     fluticasone (FLONASE) 50 MCG/ACT nasal spray Place 1 spray into both nostrils 2 (two) times daily.     furosemide  (LASIX ) 40 MG tablet TAKE ONE TABLET BY MOUTH EVERY DAY 30 tablet 1   ivabradine  ([name]) 5 MG TABS tablet Take 0.5 tablets (2.5 mg total) by mouth 2 (two) times daily. If hr greater than 70 90 tablet 3   levothyroxine  (SYNTHROID ) 25 MCG tablet Take 25 mcg by mouth daily.     montelukast  (SINGULAIR ) 10 MG tablet Take 10 mg by  mouth at bedtime. For allergies & congestion     Multiple Vitamins-Minerals (OCUVITE PO) Take 5 mg by mouth daily.     omeprazole  (PRILOSEC) 40 MG capsule TAKE ONE CAPSULE BY MOUTH TWICE DAILY 60 capsule 8   PARoxetine  (PAXIL ) 30 MG tablet Take 30 mg by mouth at bedtime.  1   Polyethyl Glycol-Propyl Glycol (SYSTANE OP) Place 1 drop into both eyes daily as needed (dry  eyes).     rosuvastatin  (CRESTOR ) 5 MG tablet TAKE ONE TABLET BY MOUTH EVERY EVENING 90 tablet 1   sacubitril -valsartan  (ENTRESTO ) 24-26 MG Take 1 tablet by mouth 2 (two) times daily. 60 tablet 11   sodium chloride  (OCEAN) 0.65 % SOLN nasal spray Place 1 spray into both nostrils daily as needed for congestion.     sodium fluoride  (DENTA 5000 PLUS) 1.1 % CREA dental cream Place 1 Application onto teeth 2 (two) times daily.     spironolactone  (ALDACTONE ) 25 MG tablet TAKE ONE TABLET BY MOUTH daily 90 tablet 1   traMADol  (ULTRAM ) 50 MG tablet Take 50 mg by mouth every 6 (six) hours as needed for pain.     vitamin E 1000 UNIT capsule Take 1,000 Units by mouth daily.     [DISCONTINUED] ranitidine (ZANTAC) 300 MG capsule Take 300 mg by mouth daily as needed. Acid indigestion     No current facility-administered medications on file prior to visit.    Allergies  Allergen Reactions   Alphagan [Brimonidine]     Irritation    Mobic [Meloxicam] Other (See Comments)    Cannot take due to heart issue   Nsaids Other (See Comments)    Cannot take due to heart issue   Polyvinyl Alcohol  Other (See Comments)    Travatan and Alphagan(irritation)   Statins Other (See Comments)    Per pt her cardiologist told her not to take   Tolmetin Other (See Comments)    Can not take due to heart issue   Travatan Z [Travoprost (Bak Free)]     Irritation   Tape Rash    Uncoded Allergy. Allergen: Tape Blister from clear tapes.     Assessment/Plan:  1. Weight loss - Patient has not met goal of at least 5% of body weight loss with comprehensive lifestyle modifications alone in the past 3-6 months. Pharmacotherapy is appropriate to pursue as augmentation. Given hx of OSA, HF with morbid obesity Will start coverage assessment for Zepbound. Confirmed patient has no personal or family history of medullary thyroid  carcinoma (MTC) or Multiple Endocrine Neoplasia syndrome type 2 (MEN 2). Injection technique reviewed at  today's visit.  Advised patient on common side effects including nausea, diarrhea, dyspepsia, decreased appetite, and fatigue. Counseled patient on reducing meal size and how to titrate medication to minimize side effects. Counseled patient to call if intolerable side effects or if experiencing dehydration, abdominal pain, or dizziness. Patient will adhere to dietary modifications and will target at least 150 minutes of moderate intensity exercise weekly.   Follow up in 1 month via telephone for tolerability update and dose titration.

## 2023-12-07 NOTE — Patient Instructions (Signed)
 Meal Planning:   Meal planning is the key to setting you up for success. Here are some examples of healthy meal options.   Breakfast     Option 1:  Omelette with vegetables (1 egg, spinach, mushrooms, or other vegetable of your choice), 2 slices whole-grain toast, tip of thumb size butter or soft margarine,  cup low-fat milk or yogurt    Option 2: steel-cut rolled oats (? cup dry), 1 tbsp peanut butter added to cooked oats,  cup low-fat milk.   Option 3: 2 slices whole-grain or rye toast with avocado spread ( small avocado mased with herbs and pepper to taste), 1 poached egg or sunnyside up (cooked to your liking)   Option 4:  cup plain 0% [place] yogurt topped with  cup berries and  cup walnuts or almonds, 2 slices whole-grain or rye toast, tip of thumb size soft margarine/butter   Lunch:    Option 1: 2 cups red lentil soup, green salad with 1 tbsp homemade vinaigrette (extra virgin olive oil and vinegar of choice plus spices)   Option 2: 3 oz. roasted chicken, 2 slices whole-grain bread, 2 tsp mayonnaise, mustard, lettuce, tomato if desired, 1 fruit (example: medium-sized apple or small pear)   Option 3: 3 oz. tuna packed in water, 1 whole-wheat pita (6 inch), 2 tsp mayonnaise, lettuce, tomato, or other non-starchy vegetable of your choice, 1 fruit (example: medium-sized apple or small pear)   Option 4: 1 serving of garden veggie buddha bowl with lentils and tahini sauce and 1 cup berries topped with  cup plain 0% Greek yogurt   Dinner:    Option 1: 1 serving roasted cauliflower salad, 3-4 oz.  grilled or baked pork loin chop, 1/2 cup mashed potato, or brown rice or quinoa    Option 2: 1 serving fish (baked, grilled or air fried), green salad, 1 tbsp homemade vinaigrette,  cup cooked couscous   Option 3: 1 cup cooked whole grained pasta (example: spaghetti, spirals, macaroni),  cup favorite pasta sauce (preferably homemade), 3-4 oz.  grilled or baked chicken, green salad, 1  tbsp homemade vinaigrette   Option 4: 1 serving oven roasted salmon,  cup mashed sweet potato or couscous or brown rice or quinoa, broccoli (steamed or roasted)   Healthy snacks:    Carrots or celery with 1 tbsp of hummus    1 medium-sized fruit (apple or orange)   1 cup plain 0% [place] yogurt with  cup berries   Half apple, sliced, with 1 tbsp (15 mL) peanut or almond butter

## 2023-12-07 NOTE — Telephone Encounter (Signed)
 Error

## 2023-12-08 ENCOUNTER — Telehealth: Payer: Self-pay | Admitting: Cardiology

## 2023-12-08 NOTE — Telephone Encounter (Signed)
 Patient states event monitor is flashing red and she hasn't placed it yet. She would like a call back with instructions.

## 2023-12-08 NOTE — Telephone Encounter (Signed)
Returned call to patient and answered all her questions.  

## 2023-12-08 NOTE — Telephone Encounter (Signed)
 Pt made aware of denial. Also inform that we will reach out to Dr.Bensimhon to see updated sleep study would be an appropriate next step.

## 2023-12-08 NOTE — Telephone Encounter (Signed)
 Pharmacy Patient Advocate Encounter  Received notification from [facility] [employer] that Prior Authorization for ZEPBOUND has been DENIED.  Full denial letter will be uploaded to the media tab. See denial reason below.    PLAN REQUIRES APNEA-HYPOPNEA INDEX GREATER THAN 15 FOR APPROVAL

## 2023-12-11 NOTE — Telephone Encounter (Signed)
-  attempted to contact pt to arrange [name] No answer unable to leave message

## 2023-12-13 NOTE — Telephone Encounter (Signed)
NV scheduled for 2/13

## 2023-12-13 NOTE — Telephone Encounter (Signed)
PA denied see other encounter- sleep study ordered

## 2023-12-14 ENCOUNTER — Ambulatory Visit (HOSPITAL_COMMUNITY)
Admission: RE | Admit: 2023-12-14 | Discharge: 2023-12-14 | Disposition: A | Discharge status: Home / Self Care | Payer: PPO | Source: Ambulatory Visit | Attending: Cardiology | Admitting: Cardiology

## 2023-12-14 VITALS — BP 101/88 | HR 77 | Resp 18

## 2023-12-14 DIAGNOSIS — I5022 Chronic systolic (congestive) heart failure: Secondary | ICD-10-CM

## 2023-12-14 DIAGNOSIS — G4733 Obstructive sleep apnea (adult) (pediatric): Secondary | ICD-10-CM

## 2023-12-14 NOTE — Progress Notes (Signed)
Your provider has recommended that you have a home sleep study (Itamar Test).  We have provided you with the equipment in our office today. Please go ahead and download the app. DO NOT OPEN OR TAMPER WITH THE BOX UNTIL WE ADVISE YOU TO DO SO. Once insurance has approved the test our office will call you with PIN number and approval to proceed with testing. Once you have completed the test you just dispose of the equipment, the information is automatically uploaded to [place] via blue-tooth technology. If your test is positive for sleep apnea and you need a home CPAP machine you will be contacted by Dr Norris Cross office [facility]) to set this up.  ITAMAR home sleep study given to patient, all instructions explained, waiver signed, and CLOUDPAT registration complete. [id number]

## 2023-12-18 ENCOUNTER — Encounter (INDEPENDENT_AMBULATORY_CARE_PROVIDER_SITE_OTHER): Payer: Self-pay | Admitting: Cardiology

## 2023-12-18 DIAGNOSIS — G4739 Other sleep apnea: Secondary | ICD-10-CM

## 2023-12-18 DIAGNOSIS — G4734 Idiopathic sleep related nonobstructive alveolar hypoventilation: Secondary | ICD-10-CM

## 2023-12-18 DIAGNOSIS — G4733 Obstructive sleep apnea (adult) (pediatric): Secondary | ICD-10-CM | POA: Diagnosis not present

## 2023-12-18 DIAGNOSIS — G4731 Primary central sleep apnea: Secondary | ICD-10-CM

## 2023-12-21 ENCOUNTER — Ambulatory Visit: Payer: PPO | Attending: Internal Medicine

## 2023-12-21 DIAGNOSIS — G4733 Obstructive sleep apnea (adult) (pediatric): Secondary | ICD-10-CM

## 2023-12-21 DIAGNOSIS — I5022 Chronic systolic (congestive) heart failure: Secondary | ICD-10-CM

## 2023-12-21 NOTE — Procedures (Signed)
   SLEEP STUDY REPORT Patient Information Study Date: 12/18/2023 Patient Name: [name] Patient ID: [id number] Birth Date: 09-07-1951 Age: 73 Gender: Female BMI: 51.2 (W=271 lb, H=5' 1'') Referring Physician: Nicholes Mango, MD  TEST DESCRIPTION: Home sleep apnea testing was completed using the WatchPat, a Type 1 device, utilizing peripheral arterial tonometry (PAT), chest movement, actigraphy, pulse oximetry, pulse rate, body position and snore. AHI was calculated with apnea and hypopnea using valid sleep time as the denominator. RDI includes apneas, hypopneas, and RERAs. The data acquired and the scoring of sleep and all associated events were performed in accordance with the recommended standards and specifications as outlined in the AASM Manual for the Scoring of Sleep and Associated Events 2.2.0 (2015). FINDINGS:  1. Severe Obstructive Sleep Apnea with AHI 70.9/hr.  2. Severe Central Sleep Apnea with pAHIc 33.2/hr.  3. Oxygen desaturations as low as 81%.  4. Moderate to severe snoring was present. O2 sats were < 88% for 9.6 min.  5. Total sleep time was 3 hrs and 55 min.  6. 14.7% of total sleep time was spent in REM sleep.  7. Normal sleep onset latency at 22 min.  8. Prolonged REM sleep onset latency at 232 min.  9. Total awakenings were 12. 10. Arrhythmia detection: Suggestive of possible brief atrial fibrillation lasting 1 hr 5 min and 13 seconds. This is not diagnostic and further testing with outpatient telemetry monitoring is recommended.  DIAGNOSIS: Severe Obstructive Sleep Apnea (G47.33) Severe Central Sleep Apnea Complex Sleep Apnea Nocturnal Hypoxemia Possible Atrial Fibrillation  RECOMMENDATIONS: 1. Clinical correlation of these findings is necessary. The decision to treat obstructive sleep apnea (OSA) is usually based on the presence of apnea symptoms or the presence of associated medical conditions such as Hypertension, Congestive Heart Failure, Atrial  Fibrillation or Obesity. The most common symptoms of OSA are snoring, gasping for breath while sleeping, daytime sleepiness and fatigue.  2. Initiating apnea therapy is recommended given the presence of symptoms and/or associated conditions. Recommend proceeding with one of the following:   a. Auto-CPAP therapy with a pressure range of 5-20cm H2O.   b. An oral appliance (OA) that can be obtained from certain dentists with expertise in sleep medicine. These are primarily of use in non-obese patients with mild and moderate disease.   c. An ENT consultation which may be useful to look for specific causes of obstruction and possible treatment options.   d. If patient is intolerant to PAP therapy, consider referral to ENT for evaluation for hypoglossal nerve stimulator.  3. Close follow-up is necessary to ensure success with CPAP or oral appliance therapy for maximum benefit .  4. A follow-up oximetry study on CPAP is recommended to assess the adequacy of therapy and determine the need for supplemental oxygen or the potential need for Bi-level therapy. An arterial blood gas to determine the adequacy of baseline ventilation and oxygenation should also be considered.  5. Healthy sleep recommendations include: adequate nightly sleep (normal 7-9 hrs/night), avoidance of caffeine after noon and alcohol near bedtime, and maintaining a sleep environment that is cool, dark and quiet.  6. Weight loss for overweight patients is recommended. Even modest amounts of weight loss can significantly improve the severity of sleep apnea.  7. Snoring recommendations include: weight loss where appropriate, side sleeping, and avoidance of alcohol before bed.  8. Operation of motor vehicle should be avoided when sleepy.  Signature: Armanda Magic, MD; [facility]; Diplomat, American Board of Sleep Medicine Electronically Signed: 12/21/2023 12:25:28 PM

## 2023-12-27 ENCOUNTER — Telehealth: Payer: Self-pay | Admitting: *Deleted

## 2023-12-27 DIAGNOSIS — G4731 Primary central sleep apnea: Secondary | ICD-10-CM

## 2023-12-27 DIAGNOSIS — G4734 Idiopathic sleep related nonobstructive alveolar hypoventilation: Secondary | ICD-10-CM

## 2023-12-27 DIAGNOSIS — I5022 Chronic systolic (congestive) heart failure: Secondary | ICD-10-CM

## 2023-12-27 DIAGNOSIS — G4733 Obstructive sleep apnea (adult) (pediatric): Secondary | ICD-10-CM

## 2023-12-27 NOTE — Telephone Encounter (Signed)
 Prior Authorization for CPAP TITRATION sent to [facility] via web portal. Tracking Number . READY-Outpatient Authorization #[id number]/Service Dates: 12/27/2023 - 03/26/2024

## 2023-12-27 NOTE — Telephone Encounter (Signed)
-----   Message from Armanda Magic sent at 12/21/2023 12:27 PM EST ----- Please let patient know that they have sleep apnea.  Recommend therapeutic CPAP titration for treatment of patient's sleep disordered breathing.

## 2023-12-29 ENCOUNTER — Telehealth: Payer: Self-pay | Admitting: Cardiology

## 2023-12-29 NOTE — Telephone Encounter (Signed)
 Patient is following up. She states she thought a 3rd event monitor was being sent out, but she hasn't received it yet and she wants to make sure it has been sent. Please advise.

## 2024-01-01 NOTE — Progress Notes (Signed)
 Remote ICD transmission.

## 2024-01-08 ENCOUNTER — Ambulatory Visit: Payer: PPO | Attending: Cardiology

## 2024-01-08 DIAGNOSIS — I5022 Chronic systolic (congestive) heart failure: Secondary | ICD-10-CM

## 2024-01-08 DIAGNOSIS — Z95810 Presence of automatic (implantable) cardiac defibrillator: Secondary | ICD-10-CM | POA: Diagnosis not present

## 2024-01-10 NOTE — Progress Notes (Signed)
 EPIC Encounter for ICM Monitoring  Patient Name: [name] is a 73 y.o. female Date: 01/10/2024 Primary Care Physican: Gordan Payment., MD Primary Cardiologist: Bensimhon Electrophysiologist: Elberta Fortis Bi-V Pacing: 93%           05/03/2023 Weight: 281.6 lbs 08/23/2023 Office Weight: 273 lbs 11/01/2022 Weight:  271 lbs 01/10/2024 Weight: 269 lbs         Spoke with patient and heart failure questions reviewed.  Transmission results reviewed.  Pt asymptomatic for fluid accumulation.  Reports feeling well at this time and voices no complaints.     Corvue thoracic impedance suggesting normal fluid levels within the last month.   Prescribed:  Furosemide 40 mg 1 tablet by mouth daily.  Spironolactone 25 mg take 1 tablet daily.       Labs: 10/26/2023 Creatinine 1.12, BUN 12, Potassium 3.6, Sodium 138, GFR 52 09/08/2023 Creatinine 1.25, BUN 20, Potassium 4.0, Sodium 140, GFR 46 07/26/2023 Creatinine 1.26, BUN 13, Potassium 3.8, Sodium 137, GFR 46 04/25/2023 Creatinine 1.29, BUN 25, Potassium 3.8, Sodium 136, GFR 44 A complete set of results can be found in Results Review.   Recommendations:  No changes and encouraged to call if experiencing any fluid symptoms.   Follow-up plan: ICM clinic phone appointment on 02/12/2024.  91 day device clinic remote transmission 02/20/2024.    EP/Cardiology Office Visits:  01/22/2024 with Dr Gala Romney.   Recall 08/17/2024 with Dr Elberta Fortis.     Copy of ICM check sent to Dr. Elberta Fortis.  3 month ICM trend: 01/08/2024.    12-14 Month ICM trend:     Karie Soda, RN 01/10/2024 7:16 AM

## 2024-01-18 DIAGNOSIS — I480 Paroxysmal atrial fibrillation: Secondary | ICD-10-CM | POA: Diagnosis not present

## 2024-01-18 DIAGNOSIS — I5022 Chronic systolic (congestive) heart failure: Secondary | ICD-10-CM

## 2024-01-18 DIAGNOSIS — I428 Other cardiomyopathies: Secondary | ICD-10-CM | POA: Diagnosis not present

## 2024-01-18 DIAGNOSIS — Z95810 Presence of automatic (implantable) cardiac defibrillator: Secondary | ICD-10-CM

## 2024-01-22 ENCOUNTER — Encounter (HOSPITAL_COMMUNITY): Payer: Self-pay | Admitting: Internal Medicine

## 2024-01-22 ENCOUNTER — Other Ambulatory Visit (HOSPITAL_COMMUNITY): Payer: Self-pay | Admitting: Internal Medicine

## 2024-01-22 ENCOUNTER — Ambulatory Visit (HOSPITAL_COMMUNITY)
Admission: RE | Admit: 2024-01-22 | Discharge: 2024-01-22 | Disposition: A | Discharge status: Home / Self Care | Payer: PPO | Source: Ambulatory Visit | Attending: Internal Medicine | Admitting: Internal Medicine

## 2024-01-22 VITALS — BP 100/70 | HR 68 | Ht 61.0 in | Wt 261.4 lb

## 2024-01-22 DIAGNOSIS — Z79899 Other long term (current) drug therapy: Secondary | ICD-10-CM | POA: Diagnosis not present

## 2024-01-22 DIAGNOSIS — M170 Bilateral primary osteoarthritis of knee: Secondary | ICD-10-CM | POA: Insufficient documentation

## 2024-01-22 DIAGNOSIS — I428 Other cardiomyopathies: Secondary | ICD-10-CM | POA: Insufficient documentation

## 2024-01-22 DIAGNOSIS — G4731 Primary central sleep apnea: Secondary | ICD-10-CM | POA: Diagnosis not present

## 2024-01-22 DIAGNOSIS — Z7982 Long term (current) use of aspirin: Secondary | ICD-10-CM | POA: Insufficient documentation

## 2024-01-22 DIAGNOSIS — I447 Left bundle-branch block, unspecified: Secondary | ICD-10-CM

## 2024-01-22 DIAGNOSIS — I7100 Dissection of unspecified site of aorta: Secondary | ICD-10-CM | POA: Insufficient documentation

## 2024-01-22 DIAGNOSIS — I493 Ventricular premature depolarization: Secondary | ICD-10-CM | POA: Insufficient documentation

## 2024-01-22 DIAGNOSIS — I110 Hypertensive heart disease with heart failure: Secondary | ICD-10-CM | POA: Diagnosis not present

## 2024-01-22 DIAGNOSIS — I480 Paroxysmal atrial fibrillation: Secondary | ICD-10-CM | POA: Diagnosis not present

## 2024-01-22 DIAGNOSIS — Z853 Personal history of malignant neoplasm of breast: Secondary | ICD-10-CM | POA: Insufficient documentation

## 2024-01-22 DIAGNOSIS — Z923 Personal history of irradiation: Secondary | ICD-10-CM | POA: Insufficient documentation

## 2024-01-22 DIAGNOSIS — I5022 Chronic systolic (congestive) heart failure: Secondary | ICD-10-CM | POA: Diagnosis present

## 2024-01-22 DIAGNOSIS — G4733 Obstructive sleep apnea (adult) (pediatric): Secondary | ICD-10-CM | POA: Diagnosis not present

## 2024-01-22 DIAGNOSIS — Z4502 Encounter for adjustment and management of automatic implantable cardiac defibrillator: Secondary | ICD-10-CM | POA: Insufficient documentation

## 2024-01-22 DIAGNOSIS — Z9221 Personal history of antineoplastic chemotherapy: Secondary | ICD-10-CM | POA: Insufficient documentation

## 2024-01-22 DIAGNOSIS — Z6841 Body Mass Index (BMI) 40.0 and over, adult: Secondary | ICD-10-CM | POA: Diagnosis not present

## 2024-01-22 NOTE — Patient Instructions (Addendum)
 Great to see you today!!!  You have been referred to Pharmacy Clinic at [facility] to discuss [facility] for weight loss and sleep apnea  Your physician recommends that you schedule a follow-up appointment in: 6 months   Do the following things EVERYDAY: Weigh yourself in the morning before breakfast. Write it down and keep it in a log. Take your medicines as prescribed Eat low salt foods--Limit salt (sodium) to 2000 mg per day.  Stay as active as you can everyday Limit all fluids for the day to less than 2 liters  If you have any questions or concerns before your next appointment please send [place] a message through [place] or call our office at [phone].    TO LEAVE A MESSAGE FOR THE NURSE SELECT OPTION 2, PLEASE LEAVE A MESSAGE INCLUDING: YOUR NAME DATE OF BIRTH CALL BACK NUMBER REASON FOR CALL**this is important as we prioritize the call backs  YOU WILL RECEIVE A CALL BACK THE SAME DAY AS LONG AS YOU CALL BEFORE 4:00 PM  At the [facility], you and your health needs are our priority. As part of our continuing mission to provide you with exceptional heart care, we have created designated Provider Care Teams. These Care Teams include your primary Cardiologist (physician) and Advanced Practice Providers (APPs- Physician Assistants and Nurse Practitioners) who all work together to provide you with the care you need, when you need it.   You may see any of the following providers on your designated Care Team at your next follow up: Dr Arvilla Meres Dr Marca Ancona Dr. Dorthula Nettles Dr. Clearnce Hasten Amy Filbert Schilder, NP Robbie Lis, [place] [facility] [place], [place] Brynda Peon, NP [place] Lee, NP Clarisa Kindred, NP Karle Plumber, PharmD Enos Fling, PharmD   Please be sure to bring in all your medications bottles to every appointment.    Thank you for choosing [facility]

## 2024-01-22 NOTE — Progress Notes (Incomplete)
 Advanced Heart Failure Clinic Note  Oncologist: Dr. Amie Critchley at [facility] EP: Dr. Johney Frame PCP: Gordan Payment., MD HF: Jiro Kiester  Chief complaint: Heart failure  HPI: Ms. Runner is a 73 y.o. woman with a history of HTN, Type B aortic dissection (2010) managed with medical therapy by Dr. Donata Clay and has healed, LBBB, systolic heart failure due to NICM with EF 10-15% Cath Dec 2011 showed normal arteries.    She was diagnosed with breast cancer in 2009 s/p lumpectomy, XRT, and chemotherapy.  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   She underwent St Jude Bi-V ICD implantation Sept 2012 but unable to place LV lead.  She eventually brought back in Nov 2012 for left thoracotomy and placement of epicardial LV lead placement by Dr. Donata Clay.  She also had a sleep study that showed mild OSA.  No clinical intervention occurred.    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 Started PPI and famotidine. Question also raised about possible trial off ARNI.   Follow up 5/22 , stable NYHA II-III. Echo  EF 20-25%, RV ok. Screened for [employer].  Echo 11/23 EF 20-25% RV ok Personally reviewed  Here for HF f/u with her husband. Main complaint is arthritis in her knees. Occasional LE edema. Chronic DOE with mild activity. No CP. No ICD firing   Zio 2/25 4 days 3 SVT (longest 46 sec) No F.   Sleep study severe OSA AHI 71 OSA 33 CSA    Cardiac Studies: - Echo (5/22): EF 20-25% RV ok  - Echo (9/21): EF 20-25% with dyssynchrony RV ok  - Echo (3/21): EF 10% with severe dyssynchrony. RV ok  - Echo (2/19): EF 20%, RV ok.  - CPX (12/13) Peak VO2: 10.6 % predicted peak VO2: 64.4% VE/VCO2 slope: 26.9 OUES: 1.18 Peak RER: 1.29 Ventilatory Threshold: 7.3 % predicted peak VO2: 44.4% Peak RR 23 Peak Ventilation: 29.8 VE/MVV: 32.6% PETCO2 at peak: 43  - CPX (4/15): Peak VO2: 10.3  (66.4% predicted peak VO2) VE/VCO2 slope: 25.0 OUES: 1.30 Peak RER: 1.20  - CPX (8/16): Peak VO2 10.1 VE/VCO2 slope 28.4 RER 1.14 OUES 1.3 PFTs mildly restrictive No significant change compared to prior.  Limited more by obesity and restrictive lung physiology than heart (mild circulatory limitation).   Echo 01/02/13 EF 15% Grade 1 diastolic dysfunction. RV ok. No PAH Echo 07/10/13 EF 20% RV ok Echo 10/13/14 EF 15-20% RV ok Echo 10/2015 EF 15%  Echo 09/30/16 LVEF 10-15%, Grade 1 DD, mild PI, Mild TR. Normal RV.  Review of systems complete and found to be negative unless listed in HPI.      Past Medical History:  Diagnosis Date   Aortic dissection (HCC) 12/2008   Type B   Arthritis    lower back   Atrial fibrillation (HCC)    post op (left thoracotomy) 11/12 req amio/DCCV   Breast cancer (HCC) 07/2009   Stage 3 lumpectomy, radiation, chemotherapy; finished chemo October, felt to be in remission   Cardiac defibrillator in place    St. Jude (JA) 9/12; LV lead placement unsuccessful;  s/p left thoracotomy with placement of epicardial LV lead 09/2011 (Dr. PVT)   CHF (congestive heart failure) (HCC)    Chronic systolic dysfunction of left ventricle    Depression    DVT (deep venous thrombosis) (HCC) 10/18/2010   GERD (gastroesophageal reflux disease)  Glaucoma    Hiatal hernia    small hiatal hernia   History of shingles    Hyperlipidemia    Hypertension    Left bundle branch block    Nonischemic cardiomyopathy (HCC)    Admission 12/11 with EF 25%, normal coronary arteries by cath 12/11; NICM presumed to be secondary to chemotherapy for breast cancer   Obesity    [name]    with exertion   Current Outpatient Medications  Medication Sig Dispense Refill   allopurinol (ZYLOPRIM) 300 MG tablet Take 300 mg by mouth daily.     aspirin 81 MG EC tablet Take 81 mg by mouth daily.     bimatoprost (LUMIGAN) 0.03 % ophthalmic solution Place 1 drop into both eyes at  bedtime. Reported on 01/12/2016     Calcium Carbonate-Vitamin D (CALCIUM-VITAMIN D3) 600-200 MG-UNIT TABS Take 1 tablet by mouth 2 (two) times daily.     carvedilol (COREG) 6.25 MG tablet Take 1 tablet (6.25 mg total) by mouth 2 (two) times daily. 180 tablet 3   cetirizine (ZYRTEC) 10 MG tablet Take 10 mg by mouth at bedtime.     Cholecalciferol (VITAMIN D) 125 MCG (5000 UT) CAPS Take 5,000 Units by mouth daily.     clotrimazole-betamethasone (LOTRISONE) cream Apply 1 Application topically 2 (two) times daily as needed (Rash).     COLCRYS 0.6 MG tablet TAKE ONE TABLET BY MOUTH EVERY DAY 30 tablet 1   cyanocobalamin (VITAMIN B12) 1000 MCG tablet Take 1,000 mcg by mouth daily.     cyclobenzaprine (FLEXERIL) 10 MG tablet Take 1 tablet (10 mg total) by mouth 3 (three) times daily as needed for muscle spasms. 30 tablet 0   dextromethorphan (DELSYM) 30 MG/5ML liquid Take 5 mLs by mouth at bedtime.     diclofenac sodium (VOLTAREN) 1 % GEL APPLY THREE GRAMS TO THREE LARGE JOINTS UP TO THREE TIMES DAILY AS NEEDED 3 Tube 3   docusate sodium (COLACE) 100 MG capsule Take 100 mg by mouth 2 (two) times daily as needed for mild constipation.     famotidine (PEPCID) 20 MG tablet Take 20 mg by mouth 2 (two) times daily.     fluticasone (FLONASE) 50 MCG/ACT nasal spray Place 1 spray into both nostrils 2 (two) times daily.     furosemide (LASIX) 40 MG tablet TAKE ONE TABLET BY MOUTH EVERY DAY 30 tablet 1   ivabradine ([name]) 5 MG TABS tablet Take 0.5 tablets (2.5 mg total) by mouth 2 (two) times daily. If hr greater than 70 90 tablet 3   levothyroxine (SYNTHROID) 25 MCG tablet Take 25 mcg by mouth daily.     montelukast (SINGULAIR) 10 MG tablet Take 10 mg by mouth at bedtime. For allergies & congestion     Multiple Vitamins-Minerals (OCUVITE PO) Take 5 mg by mouth daily.     omeprazole (PRILOSEC) 40 MG capsule TAKE ONE CAPSULE BY MOUTH TWICE DAILY 60 capsule 8   PARoxetine (PAXIL) 30 MG tablet Take 30 mg by mouth  at bedtime.  1   Polyethyl Glycol-Propyl Glycol (SYSTANE OP) Place 1 drop into both eyes daily as needed (dry eyes).     rosuvastatin (CRESTOR) 5 MG tablet TAKE ONE TABLET BY MOUTH EVERY EVENING 90 tablet 1   sacubitril-valsartan (ENTRESTO) 24-26 MG Take 1 tablet by mouth 2 (two) times daily. 60 tablet 11   sodium chloride (OCEAN) 0.65 % SOLN nasal spray Place 1 spray into both nostrils daily as needed for congestion.  sodium fluoride (DENTA 5000 PLUS) 1.1 % CREA dental cream Place 1 Application onto teeth 2 (two) times daily.     spironolactone (ALDACTONE) 25 MG tablet TAKE ONE TABLET BY MOUTH daily 90 tablet 1   traMADol (ULTRAM) 50 MG tablet Take 50 mg by mouth every 6 (six) hours as needed for pain.     vitamin E 1000 UNIT capsule Take 1,000 Units by mouth daily.     No current facility-administered medications for this encounter.   Pulse 68   Ht 5\' 1"  (1.549 m)   Wt 118.6 kg (261 lb 6.4 oz)   SpO2 96%   BMI 49.39 kg/m   Wt Readings from Last 3 Encounters:  01/22/24 118.6 kg (261 lb 6.4 oz)  12/07/23 122.9 kg (271 lb)  10/26/23 123.1 kg (271 lb 6.4 oz)   Physical Exam  General:  Obese woman sitting in WC. No resp difficulty HEENT: normal Neck: supple. no JVD. Carotids 2+ bilat; no bruits. No lymphadenopathy or thryomegaly appreciated. Cor: PMI nondisplaced. Regular rate & rhythm. No rubs, gallops or murmurs. Lungs: clear Abdomen: obese soft, nontender, nondistended. No hepatosplenomegaly. No bruits or masses. Good bowel sounds. Extremities: no cyanosis, clubbing, rash, edema Neuro: alert & orientedx3, cranial nerves grossly intact. moves all 4 extremities w/o difficulty. Affect pleasant   Device interrogation: No VT/AF  92% biv pacing. Volume up. Now back down Personally reviewed   ASSESSMENT & PLAN:  1. Chronic systolic CHF: Nonischemic cardiomyopathy.   - Echo (12/17): EF 10-15% s/p St Jude Bi-V ICD with epicardial LV lead.  - Echo (2/19): EF 20% stable. - CPX in  (8/16) was similar to the past: it appeared that the major limitation was from body habitus/deconditioning and not cardiac - Echo (3/21): EF 10-15% severe dyssynchrony, RV ok  - Underwent AV optimization of CRT in 4/21 QRS 170 -> - Echo (9/21): EF 20-25% with dyssynchrony RV ok  - Echo (5/22): EF 20-25% RV ok  - Echo (11/23): EF 20-25% RV ok  - Stable NYHA III-IIIB. Volume ok. BP low  - Continue Entresto 24/26 mg bid (previously reduced due to low BP) - Continue Lasix 40 mg daily - Cut carvedilol 12.5 bid -> 6.25 bid.  - Continue spironolactone 25 mg daily.  - No SGLT2i due to concern for UTI. - Continue [name] 2.5 mg bid  - (no Batwire with CRT-D). - Labs today - ICD interrogated personally as above. Pending ICD gen change with Dr. Elberta Fortis  2. Paroxysmal atrial fibrillation  - Regular on exam. No AF of device - Continue ASA 81 mg daily.  - If recurs, will need antiocoag with CHA2DS2/VAS of 4.   3. Type B aortic dissection  - stable  4. Morbid obesity - Body mass index is 49.39 kg/m. - Consider GLP1RA  5. Severe OSA/CSA - Consider Renaldo Harrison, MD  3:42 PM

## 2024-01-23 ENCOUNTER — Telehealth: Payer: Self-pay

## 2024-01-23 ENCOUNTER — Telehealth: Payer: Self-pay | Admitting: Cardiology

## 2024-01-23 NOTE — Telephone Encounter (Signed)
**Note De-Identified [name] Obfuscation** Per the pts CPAP Titration order, the sleep lab has reached out to her X2 to schedule her CPAP Titration without success and the pt did not return their call.  I called the pt and advised her that per her Itamar-HST results that Dr Gala Romney and Dr Mayford Knife recommends that she have her CPAP Titration done ASAP. The pt is an agreement so I gave her the sleep labs phone number and I advised her that I will call the sleep lab to let them know that she is going to be calling them this morning to schedule her CPAP Titration.  I s/w Teri at the sleep lab and she stated that they have had a few cancellations for this week and that they will offer her an appointment for this Thursday.

## 2024-01-23 NOTE — Telephone Encounter (Signed)
 Reached out to the patient and she understands it could take a couple of weeks past her test to get her cpap machine. Patient understands she can go to the beach and look for a call about her sleep study when she returns.

## 2024-01-23 NOTE — Telephone Encounter (Signed)
**Note De-identified Ahmir Bracken Obfuscation** -----  **Note De-Identified Caton Popowski Obfuscation** Message from Armanda Magic sent at 01/23/2024  8:10 AM EDT ----- Regarding: FW: Sleep follow up Please let sleep lab know this CPAP titration is Urgent due to severity of her OSA and needs to be done ASAP ----- Message ----- From: Dolores Patty, MD Sent: 01/22/2024   3:40 PM EDT To: Quintella Reichert, MD Subject: Sleep follow up                                Traci - does she have f/u with you for this?   She has the worst AHI I have ever seen between OSA and CSA  Thanks -dan

## 2024-01-23 NOTE — Telephone Encounter (Signed)
 New Message:       Patient says she is scheduled for A Sleep Study, Thursday night at [facility]. She says she is scheduled to go to the beach on Friday and stay a week. She wants to know if this will be alright? Her other question is, can her results be read and her machine ordered so she can take it with her on Friday?

## 2024-01-25 ENCOUNTER — Ambulatory Visit (HOSPITAL_BASED_OUTPATIENT_CLINIC_OR_DEPARTMENT_OTHER): Admitting: Cardiology

## 2024-02-09 ENCOUNTER — Ambulatory Visit (HOSPITAL_BASED_OUTPATIENT_CLINIC_OR_DEPARTMENT_OTHER): Attending: Cardiology | Admitting: Cardiology

## 2024-02-09 DIAGNOSIS — I5022 Chronic systolic (congestive) heart failure: Secondary | ICD-10-CM

## 2024-02-09 DIAGNOSIS — G4736 Sleep related hypoventilation in conditions classified elsewhere: Secondary | ICD-10-CM | POA: Insufficient documentation

## 2024-02-09 DIAGNOSIS — G4731 Primary central sleep apnea: Secondary | ICD-10-CM

## 2024-02-09 DIAGNOSIS — G4734 Idiopathic sleep related nonobstructive alveolar hypoventilation: Secondary | ICD-10-CM

## 2024-02-09 DIAGNOSIS — G4733 Obstructive sleep apnea (adult) (pediatric): Secondary | ICD-10-CM | POA: Diagnosis present

## 2024-02-12 ENCOUNTER — Ambulatory Visit: Attending: Cardiology

## 2024-02-12 DIAGNOSIS — I5022 Chronic systolic (congestive) heart failure: Secondary | ICD-10-CM

## 2024-02-12 DIAGNOSIS — Z95810 Presence of automatic (implantable) cardiac defibrillator: Secondary | ICD-10-CM | POA: Diagnosis not present

## 2024-02-12 NOTE — Progress Notes (Unsigned)
 EPIC Encounter for ICM Monitoring  Patient Name: [name] is a 73 y.o. female Date: 02/12/2024 Primary Care Physican: Abbe Hoard., MD Primary Cardiologist: Bensimhon Electrophysiologist: Morrell Aran Pacing: 93%           05/03/2023 Weight: 281.6 lbs 08/23/2023 Office Weight: 273 lbs 11/01/2022 Weight:  271 lbs 01/10/2024 Weight: 269 lbs         Spoke with patient and heart failure questions reviewed.  Transmission results reviewed.  Pt asymptomatic for fluid accumulation.  Reports feeling well at this time and voices no complaints.     Corvue thoracic impedance suggesting normal fluid levels within the last month.   Prescribed:  Furosemide 40 mg 1 tablet by mouth daily.  Spironolactone 25 mg take 1 tablet daily.       Labs: 10/26/2023 Creatinine 1.12, BUN 12, Potassium 3.6, Sodium 138, GFR 52 09/08/2023 Creatinine 1.25, BUN 20, Potassium 4.0, Sodium 140, GFR 46 07/26/2023 Creatinine 1.26, BUN 13, Potassium 3.8, Sodium 137, GFR 46 04/25/2023 Creatinine 1.29, BUN 25, Potassium 3.8, Sodium 136, GFR 44 A complete set of results can be found in Results Review.   Recommendations:  No changes and encouraged to call if experiencing any fluid symptoms.   Follow-up plan: ICM clinic phone appointment on 02/12/2024.  91 day device clinic remote transmission 02/20/2024.    EP/Cardiology Office Visits:  01/22/2024 with Dr Julane Ny.   Recall 08/17/2024 with Dr Lawana Pray.     Copy of ICM check sent to Dr. Lawana Pray.  3 month ICM trend: 02/12/2024.  ***  12-14 Month ICM trend:   ***  Almyra Jain, RN 02/12/2024 1:47 PM

## 2024-02-12 NOTE — Procedures (Signed)
       [facility] [address] [place], [place] [address] Tel: [phone]   Fax: [phone]  Titration Interpretation  Patient Name:  [name], [name] Date:  02/09/2024 Referring Physician:  Gaylyn Keas, MD  Indications for Polysomnography The patient is a 73 year old Female who is 5' and weighs 261.0 lbs. Her BMI equals 51.2.  A full night titration treatment study was performed.  Medication  Tramadol   Lumigan  eye drops  Omeprazole   Montelukast  Sodium  Famotidine   Entresto   Cetirizine Hydrochloride  Vitamin E  Paroxetine  HCL  Rosuvastatin  Calcium   Carvedilol   [name]   Polysomnogram Data A full night polysomnogram recorded the standard physiologic parameters including EEG, EOG, EMG, EKG, nasal and oral airflow.  Respiratory parameters of chest and abdominal movements were recorded with Respiratory Inductance Plethysmography belts.  Oxygen saturation was recorded by pulse oximetry.   Sleep Architecture The total recording time of the polysomnogram was 446.7 minutes.  The total sleep time was 275.0 minutes.  The patient spent 18.7% of total sleep time in Stage N1, 60.7% in Stage N2, 20.5% in Stages N3, and 0.0% in REM.  Sleep latency was 4.8 minutes.  REM latency was 0 minutes.  Sleep Efficiency was 61.6%.  Wake after Sleep Onset time was 167.0 minutes.  Titration Summary The patient was titrated at pressures ranging from 5cm/H20 up to 26/22/14cm/H20.The last pressure used in the study was 26/22/14cm/H20.  Respiratory Events The polysomnogram revealed a presence of 9 obstructive, 48 central, and 1 mixed apnea resulting in an Apnea index of 12.7 events per hour.  There were 131 hypopneas (>=3% desaturation and/or arousal) resulting in an Apnea\Hypopnea Index (AHI >=3% desaturation and/or arousal) of 41.2 events per hour.  There were 88 hypopneas (>=4% desaturation) resulting in an Apnea\Hypopnea Index (AHI >=4% desaturation) of  31.9 events per hour.  There were 34 Respiratory Effort Related Arousals resulting in a RERA index of 7.4 events per hour. The Respiratory Disturbance Index is 48.7 events per hour.  The snore index was 0 events per hour.   Mean oxygen saturation was 92.8%.  The lowest oxygen saturation during sleep was 84.0%.  Time spent <=88% oxygen saturation was 9.0 minutes (2.1%).  Limb Activity There were 0 limb movements recorded.    Cardiac Summary The average pulse rate was 62.1 bpm.  The minimum pulse rate was 37.0 bpm while the maximum pulse rate was 82.0 bpm.  Cardiac rhythm was normal/abnormal.  Diagnosis:  Obstructive Sleep Apnea Nocturnal Hypoxemia  Recommendations: Recommend a trial of ResMed BIPAP S/T at 26/39mmHg with a back up rate of 14 breaths per minute, heated humidity and small Airfit F10 mask. The patient should be counseled in good sleep hygiene and avoid sleeping supine. Encourage patient to avoid driving when sleepy. Followup in Sleep Medicine clinic in 6 weeks.   This study was personally reviewed and electronically signed by: Gaylyn Keas, MD Accredited Board Certified in Sleep Medicine Date/Time: 03/09/2024 8:30PM

## 2024-02-20 ENCOUNTER — Telehealth: Payer: Self-pay

## 2024-02-20 ENCOUNTER — Ambulatory Visit: Payer: PPO

## 2024-02-20 ENCOUNTER — Ambulatory Visit: Attending: Cardiology

## 2024-02-20 DIAGNOSIS — Z95810 Presence of automatic (implantable) cardiac defibrillator: Secondary | ICD-10-CM

## 2024-02-20 DIAGNOSIS — I447 Left bundle-branch block, unspecified: Secondary | ICD-10-CM | POA: Diagnosis not present

## 2024-02-20 DIAGNOSIS — I5022 Chronic systolic (congestive) heart failure: Secondary | ICD-10-CM

## 2024-02-20 NOTE — Progress Notes (Signed)
 EPIC Encounter for ICM Monitoring  Patient Name: [name] is a 73 y.o. female Date: 02/20/2024 Primary Care Physican: Abbe Hoard., MD Primary Cardiologist: Bensimhon Electrophysiologist: Morrell Aran Pacing: 92%           05/03/2023 Weight: 281.6 lbs 08/23/2023 Office Weight: 273 lbs 11/01/2022 Weight:  271 lbs 01/10/2024 Weight: 269 lbs 02/13/2024 Weight: 261 lbs   AT/AF Burden <1%         Attempted call to patient and unable to reach.   Transmission results reviewed.    Corvue thoracic impedance suggesting fluid levels returned to normal.   Prescribed:  Furosemide  40 mg 1 tablet by mouth daily.  She self adjusts Lasix  as needed. Spironolactone  25 mg take 1 tablet daily.       Labs: 12/21/2023 Creatinine 1.11, BUN 15, Potassium 4.1, Sodium 139, GFR 52 10/26/2023 Creatinine 1.12, BUN 12, Potassium 3.6, Sodium 138, GFR 52 09/08/2023 Creatinine 1.25, BUN 20, Potassium 4.0, Sodium 140, GFR 46 07/26/2023 Creatinine 1.26, BUN 13, Potassium 3.8, Sodium 137, GFR 46 04/25/2023 Creatinine 1.29, BUN 25, Potassium 3.8, Sodium 136, GFR 44 A complete set of results can be found in Results Review.   Recommendations:  Unable to reach.     Follow-up plan: ICM clinic phone appointment on 03/18/2024.  91 day device clinic remote transmission 05/21/2024.    EP/Cardiology Office Visits:  Next HF clinic 6 month appt due 08/23/2024 with Dr Julane Ny (no recall).   Recall 08/17/2024 with Dr Lawana Pray.     Copy of ICM check sent to Dr. Lawana Pray.  3 month ICM trend: 02/20/2024.    12-14 Month ICM trend:     Almyra Jain, RN 02/20/2024 12:24 PM

## 2024-02-20 NOTE — Telephone Encounter (Signed)
Remote ICM transmission received.  Attempted call to patient regarding ICM remote transmission and mail box is full. 

## 2024-02-21 LAB — CUP PACEART REMOTE DEVICE CHECK
Battery Remaining Longevity: 55 mo
Battery Remaining Percentage: 81 %
Battery Voltage: 2.99 V
Brady Statistic AP VP Percent: 6 %
Brady Statistic AP VS Percent: 1 %
Brady Statistic AS VP Percent: 86 %
Brady Statistic AS VS Percent: 3.2 %
Brady Statistic RA Percent Paced: 3.8 %
Date Time Interrogation Session: 20250422020021
HighPow Impedance: 97 Ohm
HighPow Impedance: 97 Ohm
Implantable Lead Connection Status: 753985
Implantable Lead Connection Status: 753985
Implantable Lead Connection Status: 753985
Implantable Lead Implant Date: 20120914
Implantable Lead Implant Date: 20120914
Implantable Lead Implant Date: 20121116
Implantable Lead Location: 753858
Implantable Lead Location: 753859
Implantable Lead Location: 753860
Implantable Lead Model: 511212
Implantable Lead Serial Number: 194059
Implantable Pulse Generator Implant Date: 20240722
Lead Channel Impedance Value: 280 Ohm
Lead Channel Impedance Value: 400 Ohm
Lead Channel Impedance Value: 430 Ohm
Lead Channel Pacing Threshold Amplitude: 0.875 V
Lead Channel Pacing Threshold Amplitude: 1 V
Lead Channel Pacing Threshold Amplitude: 1.5 V
Lead Channel Pacing Threshold Pulse Width: 0.5 ms
Lead Channel Pacing Threshold Pulse Width: 0.5 ms
Lead Channel Pacing Threshold Pulse Width: 0.7 ms
Lead Channel Sensing Intrinsic Amplitude: 0.6 mV
Lead Channel Sensing Intrinsic Amplitude: 12 mV
Lead Channel Setting Pacing Amplitude: 1.875
Lead Channel Setting Pacing Amplitude: 2 V
Lead Channel Setting Pacing Amplitude: 2.75 V
Lead Channel Setting Pacing Pulse Width: 0.5 ms
Lead Channel Setting Pacing Pulse Width: 0.7 ms
Lead Channel Setting Sensing Sensitivity: 0.5 mV
Pulse Gen Serial Number: 5552877

## 2024-02-27 ENCOUNTER — Other Ambulatory Visit (HOSPITAL_COMMUNITY): Payer: Self-pay | Admitting: Internal Medicine

## 2024-03-01 ENCOUNTER — Other Ambulatory Visit (HOSPITAL_COMMUNITY): Payer: Self-pay

## 2024-03-11 ENCOUNTER — Telehealth: Payer: Self-pay

## 2024-03-11 NOTE — Telephone Encounter (Signed)
-----   Message from Gaylyn Keas sent at 03/09/2024  8:33 PM EDT ----- Please let patient know that they had a successful PAP titration and let DME know that orders are in EPIC.  Please set up 6 week OV with me.

## 2024-03-11 NOTE — Telephone Encounter (Signed)
 Notified patient of sleep study results and recommendations. All questions (if any) were answered and patient verbalized understanding. Order for PAP device and supplies sent to [facility] today.

## 2024-03-14 ENCOUNTER — Other Ambulatory Visit (HOSPITAL_COMMUNITY): Payer: Self-pay | Admitting: Internal Medicine

## 2024-03-15 ENCOUNTER — Other Ambulatory Visit (HOSPITAL_COMMUNITY): Payer: Self-pay | Admitting: Internal Medicine

## 2024-03-18 ENCOUNTER — Ambulatory Visit: Attending: Cardiology

## 2024-03-18 DIAGNOSIS — I5022 Chronic systolic (congestive) heart failure: Secondary | ICD-10-CM | POA: Diagnosis not present

## 2024-03-18 DIAGNOSIS — Z95810 Presence of automatic (implantable) cardiac defibrillator: Secondary | ICD-10-CM

## 2024-03-19 ENCOUNTER — Telehealth: Payer: Self-pay | Admitting: Cardiology

## 2024-03-19 NOTE — Progress Notes (Signed)
 EPIC Encounter for ICM Monitoring  Patient Name: [name] is a 73 y.o. female Date: 03/19/2024 Primary Care Physican: Abbe Hoard., MD Primary Cardiologist: Bensimhon Electrophysiologist: Morrell Aran Pacing: 92%           05/03/2023 Weight: 281.6 lbs 08/23/2023 Office Weight: 273 lbs 11/01/2022 Weight:  271 lbs 01/10/2024 Weight: 269 lbs 02/13/2024 Weight: 261 lbs 03/19/2024 Office Weight: 258 lbs   AT/AF Burden <1%         Spoke with patient and heart failure questions reviewed.  Transmission results reviewed.  Pt ankles were swollen this past week and took extra 40 mg Lasix  on 5/16 and 5/17.  She said she felt like she was pass out a couple times a couple weeks ago.  She was traveling 5/5-5/9.  BP 73/48 on 5/9 and felt like she as going to pass out but never did.   Corvue thoracic impedance suggesting  possible fluid accumulation from 5/5-5/17 and possible dryness starting 5/18.   Prescribed:  Furosemide  40 mg 1 tablet by mouth daily.  She self adjusts Lasix  as needed. Spironolactone  25 mg take 1 tablet daily.       Labs: 12/21/2023 Creatinine 1.11, BUN 15, Potassium 4.1, Sodium 139, GFR 52 10/26/2023 Creatinine 1.12, BUN 12, Potassium 3.6, Sodium 138, GFR 52 09/08/2023 Creatinine 1.25, BUN 20, Potassium 4.0, Sodium 140, GFR 46 07/26/2023 Creatinine 1.26, BUN 13, Potassium 3.8, Sodium 137, GFR 46 04/25/2023 Creatinine 1.29, BUN 25, Potassium 3.8, Sodium 136, GFR 44 A complete set of results can be found in Results Review.   Recommendations:  Encouraged to increase fluid intake today 5/20.     Follow-up plan: ICM clinic phone appointment on 05/22/2024.  91 day device clinic remote transmission 05/21/2024.    EP/Cardiology Office Visits:  07/22/2024 with HF clinic.   Recall 08/17/2024 with Dr Lawana Pray.     Copy of ICM check sent to Dr. Lawana Pray.   3 month ICM trend: 03/18/2024.    12-14 Month ICM trend:     Almyra Jain, RN 03/19/2024 3:36 PM

## 2024-03-19 NOTE — Telephone Encounter (Signed)
 Patient is requesting updates on PAP order because she hasn't heard from [facility] regarding supplies. She would like to know if sleep coordinator is able to contact [facility] to provide updates. She would like a call back to discuss.

## 2024-03-28 NOTE — Telephone Encounter (Signed)
 Patient has been contacted by the dme.

## 2024-04-04 ENCOUNTER — Telehealth: Payer: Self-pay

## 2024-04-04 NOTE — Telephone Encounter (Signed)
 Received voice mail message from patient.  She sent Merlin remote transmission 6/4 due to having dizziness and low HR.  She did not pass out but has not felt well.  She would like the remote transmission reviewed and give her a call back.  Forwarded to device clinic triage for follow up.

## 2024-04-04 NOTE — Telephone Encounter (Signed)
 Spoke to patient who reports dizziness yesterday. States today she feels much better. Patient is at the bank, will be home in 10 minutes and send another transmission to review updated rhythm.   Discussed AF clinic referral with patient. She is agreeable. Apt made tomorrow 04/05/24 @ 10:30 with PA. Patient aware of apt and info.

## 2024-04-04 NOTE — Addendum Note (Signed)
 Addended by: Lott Rouleau A on: 04/04/2024 01:50 PM   Modules accepted: Orders

## 2024-04-04 NOTE — Telephone Encounter (Signed)
 Patient called back to check and see if we received her remote transmission. Transmission received and reviewed by this RN Presenting rhythm AS/BiVP in the 90's  This RN confirmed AF clinic visit time and location with patient and patient acknowledged understanding All questions answered.

## 2024-04-04 NOTE — Telephone Encounter (Signed)
 Spoke with patient and advised report suggesting possible fluid accumulation starting 6/1. She is asymptomatic for fluid accumulation.  She self adjusts Lasix  when retaining fluid and will take extra 40 mg tomorrow and extra 20 mg on Sunday 6/8.  Will send a manual transmission on 6/9 for fluid level review.

## 2024-04-04 NOTE — Progress Notes (Signed)
 Remote ICD transmission.

## 2024-04-05 ENCOUNTER — Observation Stay (HOSPITAL_COMMUNITY)
Admission: EM | Admit: 2024-04-05 | Discharge: 2024-04-08 | Disposition: A | Discharge status: Still In Hospital | Attending: Internal Medicine | Admitting: Internal Medicine

## 2024-04-05 ENCOUNTER — Telehealth (HOSPITAL_COMMUNITY): Payer: Self-pay

## 2024-04-05 ENCOUNTER — Ambulatory Visit (HOSPITAL_COMMUNITY)
Admission: RE | Admit: 2024-04-05 | Discharge: 2024-04-05 | Disposition: A | Discharge status: Home / Self Care | Source: Ambulatory Visit | Attending: Internal Medicine | Admitting: Internal Medicine

## 2024-04-05 ENCOUNTER — Other Ambulatory Visit: Payer: Self-pay

## 2024-04-05 ENCOUNTER — Encounter (HOSPITAL_COMMUNITY): Payer: Self-pay | Admitting: Emergency Medicine

## 2024-04-05 ENCOUNTER — Emergency Department (HOSPITAL_COMMUNITY)

## 2024-04-05 VITALS — BP 98/52 | HR 84 | Ht 62.0 in | Wt 262.0 lb

## 2024-04-05 DIAGNOSIS — I130 Hypertensive heart and chronic kidney disease with heart failure and stage 1 through stage 4 chronic kidney disease, or unspecified chronic kidney disease: Secondary | ICD-10-CM | POA: Diagnosis not present

## 2024-04-05 DIAGNOSIS — K219 Gastro-esophageal reflux disease without esophagitis: Secondary | ICD-10-CM | POA: Insufficient documentation

## 2024-04-05 DIAGNOSIS — N1831 Chronic kidney disease, stage 3a: Secondary | ICD-10-CM | POA: Insufficient documentation

## 2024-04-05 DIAGNOSIS — I4891 Unspecified atrial fibrillation: Secondary | ICD-10-CM | POA: Diagnosis not present

## 2024-04-05 DIAGNOSIS — D6869 Other thrombophilia: Secondary | ICD-10-CM

## 2024-04-05 DIAGNOSIS — I493 Ventricular premature depolarization: Secondary | ICD-10-CM | POA: Insufficient documentation

## 2024-04-05 DIAGNOSIS — Z95810 Presence of automatic (implantable) cardiac defibrillator: Secondary | ICD-10-CM | POA: Insufficient documentation

## 2024-04-05 DIAGNOSIS — E785 Hyperlipidemia, unspecified: Secondary | ICD-10-CM | POA: Diagnosis not present

## 2024-04-05 DIAGNOSIS — I5022 Chronic systolic (congestive) heart failure: Secondary | ICD-10-CM | POA: Diagnosis not present

## 2024-04-05 DIAGNOSIS — M109 Gout, unspecified: Secondary | ICD-10-CM | POA: Insufficient documentation

## 2024-04-05 DIAGNOSIS — R7989 Other specified abnormal findings of blood chemistry: Secondary | ICD-10-CM | POA: Insufficient documentation

## 2024-04-05 DIAGNOSIS — R55 Syncope and collapse: Principal | ICD-10-CM | POA: Diagnosis present

## 2024-04-05 DIAGNOSIS — E039 Hypothyroidism, unspecified: Secondary | ICD-10-CM

## 2024-04-05 DIAGNOSIS — I428 Other cardiomyopathies: Secondary | ICD-10-CM | POA: Diagnosis not present

## 2024-04-05 DIAGNOSIS — I509 Heart failure, unspecified: Secondary | ICD-10-CM

## 2024-04-05 DIAGNOSIS — I480 Paroxysmal atrial fibrillation: Secondary | ICD-10-CM

## 2024-04-05 DIAGNOSIS — I10 Essential (primary) hypertension: Secondary | ICD-10-CM | POA: Diagnosis present

## 2024-04-05 LAB — CBC WITH DIFFERENTIAL/PLATELET
Abs Immature Granulocytes: 0.02 10*3/uL (ref 0.00–0.07)
Basophils Absolute: 0.1 10*3/uL (ref 0.0–0.1)
Basophils Relative: 1 %
Eosinophils Absolute: 0.1 10*3/uL (ref 0.0–0.5)
Eosinophils Relative: 2 %
HCT: 43.1 % (ref 36.0–46.0)
Hemoglobin: 14.2 g/dL (ref 12.0–15.0)
Immature Granulocytes: 0 %
Lymphocytes Relative: 20 %
Lymphs Abs: 1.6 10*3/uL (ref 0.7–4.0)
MCH: 31 pg (ref 26.0–34.0)
MCHC: 32.9 g/dL (ref 30.0–36.0)
MCV: 94.1 fL (ref 80.0–100.0)
Monocytes Absolute: 0.5 10*3/uL (ref 0.1–1.0)
Monocytes Relative: 7 %
Neutro Abs: 5.4 10*3/uL (ref 1.7–7.7)
Neutrophils Relative %: 70 %
Platelets: 132 10*3/uL — ABNORMAL LOW (ref 150–400)
RBC: 4.58 MIL/uL (ref 3.87–5.11)
RDW: 15.2 % (ref 11.5–15.5)
WBC: 7.7 10*3/uL (ref 4.0–10.5)
nRBC: 0 % (ref 0.0–0.2)

## 2024-04-05 LAB — COMPREHENSIVE METABOLIC PANEL WITH GFR
ALT: 11 U/L (ref 0–44)
AST: 24 U/L (ref 15–41)
Albumin: 3.2 g/dL — ABNORMAL LOW (ref 3.5–5.0)
Alkaline Phosphatase: 79 U/L (ref 38–126)
Anion gap: 14 (ref 5–15)
BUN: 14 mg/dL (ref 8–23)
CO2: 27 mmol/L (ref 22–32)
Calcium: 9.2 mg/dL (ref 8.9–10.3)
Chloride: 100 mmol/L (ref 98–111)
Creatinine, Ser: 1.45 mg/dL — ABNORMAL HIGH (ref 0.44–1.00)
GFR, Estimated: 38 mL/min — ABNORMAL LOW (ref >60)
Glucose, Bld: 116 mg/dL — ABNORMAL HIGH (ref 70–99)
Potassium: 3.8 mmol/L (ref 3.5–5.1)
Sodium: 141 mmol/L (ref 135–145)
Total Bilirubin: 0.8 mg/dL (ref 0.0–1.2)
Total Protein: 7.1 g/dL (ref 6.5–8.1)

## 2024-04-05 LAB — TROPONIN I (HIGH SENSITIVITY)
Troponin I (High Sensitivity): 61 ng/L — ABNORMAL HIGH (ref <18)
Troponin I (High Sensitivity): 57 ng/L — ABNORMAL HIGH (ref <18)

## 2024-04-05 LAB — MAGNESIUM: Magnesium: 1.8 mg/dL (ref 1.7–2.4)

## 2024-04-05 MED ORDER — FUROSEMIDE 10 MG/ML IJ SOLN: 40.0000 mg | Freq: Once | INTRAMUSCULAR | Status: DC

## 2024-04-05 MED ORDER — APIXABAN 5 MG PO TABS: 5.0000 mg | ORAL_TABLET | Freq: Two times a day (BID) | ORAL | 3 refills | Status: DC

## 2024-04-05 NOTE — Telephone Encounter (Signed)
 Received call from patient reporting that she had an episode this afternoon at the eye dr where she had eye fluttering, dizziness, inability to talk, and felt like her hands and feet were moving. Patient reports that she felt like when she did when her defib went off in the past. Patient reports that her defib did not go off today. Reports her BP was low and reports that her HR was all over the place- with the lowest being 40bpm. Patient states she is going to the ER to be evaluated at the request of her eye dr.   Jillene Motley to make Dr. Julane Ny aware, and rounding team. Advised her I would forward message over. Patient verbalized understanding.

## 2024-04-05 NOTE — ED Provider Notes (Signed)
 [place] [facility] AT [facility] Provider Note   CSN: [id number] Arrival date & time: 04/05/24  1419     History  Chief Complaint  Patient presents with   Atrial Fibrillation    [name] is a 73 y.o. female.  Pt is a 73 yo with pmhx significant for nonischemic cardiomyopathy, DVT, obesity, type B aortic dissection, breast cancer, hld, afib, AICD placement.  Pt said she went to her cardiologist today and was put on Eliquis for afib.  Her device alerted on 6/4 and said she was in afib with rvr which is why she went to cards.  After her cards visit, she went to an ophthalmology appointment and had a near syncopal event.  HR was checked there and it was up and down.  Pt said she feels tired now, but she's had a long day.              Home Medications Prior to Admission medications   Medication Sig Start Date End Date Taking? Authorizing Provider  allopurinol  (ZYLOPRIM ) 300 MG tablet Take 300 mg by mouth daily.   Yes [provider]  bimatoprost  (LUMIGAN ) 0.03 % ophthalmic solution Place 1 drop into both eyes at bedtime. Reported on 01/12/2016   Yes [provider]  Calcium  Carbonate-Vitamin D (CALCIUM -VITAMIN D3) 600-200 MG-UNIT TABS Take 1 tablet by mouth 2 (two) times daily.   Yes [provider]  carvedilol  (COREG ) 6.25 MG tablet TAKE ONE TABLET BY MOUTH TWICE DAILY 02/27/24  Yes Bensimhon, Rheta Celestine, MD  cetirizine (ZYRTEC) 10 MG tablet Take 10 mg by mouth at bedtime. 04/08/19  Yes [provider]  Cholecalciferol (VITAMIN D) 125 MCG (5000 UT) CAPS Take 5,000 Units by mouth daily.   Yes [provider]  clotrimazole-betamethasone (LOTRISONE) cream Apply 1 Application topically 2 (two) times daily as needed (Rash).   Yes [provider]  cyanocobalamin (VITAMIN B12) 1000 MCG tablet Take 1,000 mcg by mouth daily.   Yes [provider]  dextromethorphan (DELSYM) 30 MG/5ML liquid Take 5 mLs by  mouth daily as needed for cough.   Yes [provider]  diclofenac  sodium (VOLTAREN ) 1 % GEL APPLY THREE GRAMS TO THREE LARGE JOINTS UP TO THREE TIMES DAILY AS NEEDED 04/04/18  Yes Romayne Clubs, PA-C  docusate sodium  (COLACE) 100 MG capsule Take 100 mg by mouth 2 (two) times daily as needed for mild constipation.   Yes [provider]  ENTRESTO  24-26 MG TAKE ONE TABLET BY MOUTH TWICE DAILY 03/15/24  Yes Bensimhon, Rheta Celestine, MD  famotidine  (PEPCID ) 20 MG tablet Take 20 mg by mouth 2 (two) times daily.   Yes [provider]  furosemide  (LASIX ) 40 MG tablet TAKE ONE TABLET BY MOUTH EVERY DAY 03/15/24  Yes Bensimhon, Daniel R, MD  ipratropium (ATROVENT) 0.03 % nasal spray Place 2 sprays into both nostrils daily as needed for rhinitis.   Yes [provider]  ivabradine  ([name]) 5 MG TABS tablet Take 0.5 tablets (2.5 mg total) by mouth 2 (two) times daily. If hr greater than 70 07/11/23  Yes Bensimhon, Rheta Celestine, MD  apixaban (ELIQUIS) 5 MG TABS tablet Take 1 tablet (5 mg total) by mouth 2 (two) times daily. Patient not taking: Reported on 04/05/2024 04/05/24   Nathanel Bal, PA-C  COLCRYS  0.6 MG tablet TAKE ONE TABLET BY MOUTH EVERY DAY Patient not taking: Reported on 04/05/2024 07/03/18   Nicholas Bari, MD  cyclobenzaprine  (FLEXERIL ) 10 MG tablet  Take 1 tablet (10 mg total) by mouth 3 (three) times daily as needed for muscle spasms. Patient not taking: Reported on 04/05/2024 12/27/13   Bensimhon, Daniel R, MD  levothyroxine (SYNTHROID) 25 MCG tablet Take 25 mcg by mouth daily. 01/15/20   [provider]  montelukast  (SINGULAIR ) 10 MG tablet Take 10 mg by mouth at bedtime. For allergies & congestion    [provider]  Multiple Vitamins-Minerals (OCUVITE PO) Take 5 mg by mouth daily.    [provider]  omeprazole  (PRILOSEC) 40 MG capsule TAKE ONE CAPSULE BY MOUTH TWICE DAILY 08/04/20   Kozlow, Eric J, MD  PARoxetine  (PAXIL ) 30 MG tablet Take 30 mg  by mouth at bedtime. 02/10/16   [provider]  Polyethyl Glycol-Propyl Glycol (SYSTANE OP) Place 1 drop into both eyes daily as needed (dry eyes).    [provider]  rosuvastatin  (CRESTOR ) 5 MG tablet TAKE ONE TABLET BY MOUTH EVERY EVENING 11/07/23   Bensimhon, Rheta Celestine, MD  sodium chloride  (OCEAN) 0.65 % SOLN nasal spray Place 1 spray into both nostrils daily as needed for congestion.    [provider]  sodium fluoride (DENTA 5000 PLUS) 1.1 % CREA dental cream Place 1 Application onto teeth 2 (two) times daily.    [provider]  spironolactone  (ALDACTONE ) 25 MG tablet TAKE ONE TABLET BY MOUTH daily 11/07/23   Bensimhon, Rheta Celestine, MD  traMADol  (ULTRAM ) 50 MG tablet Take 50 mg by mouth every 6 (six) hours as needed for pain.    [provider]  vitamin E 1000 UNIT capsule Take 1,000 Units by mouth daily.    [provider]  ranitidine (ZANTAC) 300 MG capsule Take 300 mg by mouth daily as needed. Acid indigestion  01/24/12  [provider]      Allergies    Alphagan [brimonidine], Mobic [meloxicam], Nsaids, Polyvinyl alcohol, Statins, Tolmetin, Travatan z [travoprost (bak free)], and Tape    Review of Systems   Review of Systems  Cardiovascular:  Positive for palpitations.  All other systems reviewed and are negative.   Physical Exam Updated Vital Signs BP 107/79   Pulse 61   Temp 98.6 F (37 C) (Oral)   Resp (!) 24   Ht 5\' 2"  (1.575 m)   Wt 118.4 kg   SpO2 99%   BMI 47.74 kg/m  Physical Exam Vitals and nursing note reviewed.  Constitutional:      Appearance: Normal appearance. She is obese.  HENT:     Head: Normocephalic and atraumatic.     Right Ear: External ear normal.     Left Ear: External ear normal.     Nose: Nose normal.     Mouth/Throat:     Mouth: Mucous membranes are moist.     Pharynx: Oropharynx is clear.  Eyes:     Extraocular Movements: Extraocular movements intact.     Conjunctiva/sclera:  Conjunctivae normal.     Pupils: Pupils are equal, round, and reactive to light.  Cardiovascular:     Rate and Rhythm: Normal rate and regular rhythm.     Pulses: Normal pulses.     Heart sounds: Normal heart sounds.  Pulmonary:     Effort: Pulmonary effort is normal.     Breath sounds: Normal breath sounds.  Abdominal:     General: Abdomen is flat. Bowel sounds are normal.     Palpations: Abdomen is soft.  Musculoskeletal:        General: Normal range of motion.  Cervical back: Normal range of motion and neck supple.  Skin:    General: Skin is warm.     Capillary Refill: Capillary refill takes less than 2 seconds.  Neurological:     General: No focal deficit present.     Mental Status: She is alert and oriented to person, place, and time.  Psychiatric:        Mood and Affect: Mood normal.        Behavior: Behavior normal.     ED Results / Procedures / Treatments   Labs (all labs ordered are listed, but only abnormal results are displayed) Labs Reviewed  CBC WITH DIFFERENTIAL/PLATELET - Abnormal; Notable for the following components:      Result Value   Platelets 132 (*)    All other components within normal limits  COMPREHENSIVE METABOLIC PANEL WITH GFR - Abnormal; Notable for the following components:   Glucose, Bld 116 (*)    Creatinine, Ser 1.45 (*)    Albumin 3.2 (*)    GFR, Estimated 38 (*)    All other components within normal limits  TROPONIN I (HIGH SENSITIVITY) - Abnormal; Notable for the following components:   Troponin I (High Sensitivity) 61 (*)    All other components within normal limits  TROPONIN I (HIGH SENSITIVITY) - Abnormal; Notable for the following components:   Troponin I (High Sensitivity) 57 (*)    All other components within normal limits  MAGNESIUM   URINALYSIS, ROUTINE W REFLEX MICROSCOPIC  TSH  CBG MONITORING, ED    EKG EKG Interpretation Date/Time:  Friday April 05 2024 14:38:40 EDT Ventricular Rate:  85 PR Interval:  190 QRS  Duration:  140 QT Interval:  458 QTC Calculation: 545 R Axis:   -42  Text Interpretation: Atrial-sensed ventricular-paced rhythm Abnormal ECG When compared with ECG of 05-Apr-2024 10:22, PREVIOUS ECG IS PRESENT No significant change since last tracing Confirmed by Sueellen Emery [phone]) on 04/05/2024 5:04:25 PM  Radiology DG Chest Port 1 View Result Date: 04/05/2024 CLINICAL DATA:  [name]. EXAM: PORTABLE CHEST 1 VIEW COMPARISON:  01/10/2019. FINDINGS: Stable cardiomegaly and left ventricular prominence. Left-sided pacemaker/AICD in place. Mild left basilar opacity could reflect overlying soft tissue, atelectasis, or infiltrate. The right lung is clear. No sizable pleural effusion or pneumothorax. Surgical clips overlying the right breast. No acute osseous abnormality. IMPRESSION: Stable cardiomegaly. Mild left basilar opacity could reflect overlying soft tissue, atelectasis, or infiltrate. Electronically Signed   By: Mannie Seek M.D.   On: 04/05/2024 15:52    Procedures Procedures    Medications Ordered in ED Medications  furosemide  (LASIX ) injection 40 mg (has no administration in time range)    ED Course/ Medical Decision Making/ A&P                                 Medical Decision Making Amount and/or Complexity of Data Reviewed Labs: ordered. Radiology: ordered.  Risk Prescription drug management. Decision regarding hospitalization.   This patient presents to the ED for concern of near syncope, this involves an extensive number of treatment options, and is a complaint that carries with it a high risk of complications and morbidity.  The differential diagnosis includes afib with rvr, electrolyte abn   Co morbidities that complicate the patient evaluation   nonischemic cardiomyopathy, DVT, obesity, type B aortic dissection, breast cancer, hld, afib, AICD placement   Additional history obtained:  Additional history obtained from epic chart  review External records from outside source obtained and reviewed including family   Lab Tests:  I Ordered, and personally interpreted labs.  The pertinent results include:  cbc nl, cmp nl other than cr sl elevated at 1.45, mg 1.8, trop elevated at 61 and 57   Imaging Studies ordered:  I ordered imaging studies including cxr  I independently visualized and interpreted imaging which showed  Stable cardiomegaly. Mild left basilar opacity could reflect  overlying soft tissue, atelectasis, or infiltrate.   I agree with the radiologist interpretation   Cardiac Monitoring:  The patient was maintained on a cardiac monitor.  I personally viewed and interpreted the cardiac monitored which showed an underlying rhythm of: av paced   Medicines ordered and prescription drug management:   I have reviewed the patients home medicines and have made adjustments as needed   Test Considered:  ct   Critical Interventions:  meds   Consultations Obtained:  I requested consultation with the cardiologist (Dr. Ossie Blend),  and discussed lab and imaging findings as well as pertinent plan - he recommend overnight obs by medicine Pt d/w Dr. Michell Ahumada (triad) for admission.   Problem List / ED Course:  Paroxysmal atrial fib.  Pt on eliquis. CHA2DS2/VAS Stroke Risk Points  Current as of 7 minutes ago     4 >= 2 Points: High Risk  1 to 1.99 Points: Medium Risk  0 Points: Low Risk    Last Change: N/A      Details    This score determines the patient's risk of having a stroke if the  patient has atrial fibrillation.       Points Metrics  1 Has Congestive Heart Failure:  Yes    Current as of 7 minutes ago  0 Has Vascular Disease:  No    Current as of 7 minutes ago  1 Has Hypertension:  Yes    Current as of 7 minutes ago  1 Age:  23    Current as of 7 minutes ago  0 Has Diabetes Excluding Gestational Diabetes:  No    Current as of 7 minutes ago  0 Had Stroke:  No  Had TIA:  No  Had  Thromboembolism:  No    Current as of 7 minutes ago  1 Female:  Yes    Current as of 7 minutes ago       Near syncope:  etiology unclear.  Device finally transmitted and the rep said there was no arrhythmia this afternoon.  He did note that she's had a high PVC burden.  Pt also had fluid build up.  Last echo from 08/2022 showed an EF of 20-25%.  Lasix  given.   Reevaluation:  After the interventions noted above, I reevaluated the patient and found that they have :improved   Social Determinants of Health:  Lives at home   Dispostion:  After consideration of the diagnostic results and the patients response to treatment, I feel that the patent would benefit from admit for obs.          Final Clinical Impression(s) / ED Diagnoses Final diagnoses:  Paroxysmal atrial fibrillation (HCC)  Near syncope  PVC (premature ventricular contraction)  Acute congestive heart failure, unspecified heart failure type [facility])    Rx / DC Orders ED Discharge Orders     None         Sueellen Emery, MD 04/05/24 2238

## 2024-04-05 NOTE — ED Triage Notes (Signed)
 Patient c/o atrial fibrillation and "seizure like activity" at eye doctor this AM.  Patient does have AICD and is unsure if it went off or not.  Patient gives verbal consent for MSE.

## 2024-04-05 NOTE — ED Notes (Signed)
 Attempts made to interrogate [employer] pacemaker unsuccessful.

## 2024-04-05 NOTE — ED Notes (Signed)
 Pacemaker interrogated.

## 2024-04-05 NOTE — ED Notes (Signed)
 Called and placed on CCMD monitor

## 2024-04-05 NOTE — Progress Notes (Signed)
 Primary Care Physician: Abbe Hoard., MD Primary Cardiologist: None Electrophysiologist: Will Cortland Ding, MD     Referring Physician: Device clinic     [name] is a 73 y.o. female with a history of HTN, breast cancer in 2009 s/p lumpectomy, XRT, and chemotherapy, ICD, Type B aortic dissection (2010), LBBB, systolic HF due to NICM, and paroxysmal atrial fibrillation who presents for consultation in the [facility]. Per history patient previously had an episode of Afib in 2012 when the patient was postop. Device clinic alert on 6/4 due to patient having dizziness and noted to have brief Afib with RVR. Patient has a CHADS2VASC score of 4.  On evaluation today, she is currently in AV paced rhythm. She does confirm she felt symptomatic during Afib episode.  Today, she denies symptoms of palpitations, chest pain, shortness of breath, orthopnea, PND, lower extremity edema, presyncope, syncope, snoring, daytime somnolence, bleeding, or neurologic sequela. The patient is tolerating medications without difficulties and is otherwise without complaint today.    she has a BMI of Body mass index is 47.92 kg/m.Aaron Aas Filed Weights   04/05/24 1002  Weight: 118.8 kg    Current Outpatient Medications  Medication Sig Dispense Refill   allopurinol  (ZYLOPRIM ) 300 MG tablet Take 300 mg by mouth daily.     apixaban (ELIQUIS) 5 MG TABS tablet Take 1 tablet (5 mg total) by mouth 2 (two) times daily. 60 tablet 3   bimatoprost  (LUMIGAN ) 0.03 % ophthalmic solution Place 1 drop into both eyes at bedtime. Reported on 01/12/2016     Calcium  Carbonate-Vitamin D (CALCIUM -VITAMIN D3) 600-200 MG-UNIT TABS Take 1 tablet by mouth 2 (two) times daily.     carvedilol  (COREG ) 6.25 MG tablet TAKE ONE TABLET BY MOUTH TWICE DAILY 180 tablet 0   cetirizine (ZYRTEC) 10 MG tablet Take 10 mg by mouth at bedtime.     Cholecalciferol (VITAMIN D) 125 MCG (5000 UT) CAPS Take 5,000 Units by mouth  daily.     clotrimazole-betamethasone (LOTRISONE) cream Apply 1 Application topically 2 (two) times daily as needed (Rash).     COLCRYS  0.6 MG tablet TAKE ONE TABLET BY MOUTH EVERY DAY 30 tablet 1   cyanocobalamin (VITAMIN B12) 1000 MCG tablet Take 1,000 mcg by mouth daily.     cyclobenzaprine  (FLEXERIL ) 10 MG tablet Take 1 tablet (10 mg total) by mouth 3 (three) times daily as needed for muscle spasms. (Patient taking differently: Take 10 mg by mouth as needed for muscle spasms.) 30 tablet 0   dextromethorphan (DELSYM) 30 MG/5ML liquid Take 5 mLs by mouth at bedtime.     diclofenac  sodium (VOLTAREN ) 1 % GEL APPLY THREE GRAMS TO THREE LARGE JOINTS UP TO THREE TIMES DAILY AS NEEDED 3 Tube 3   docusate sodium  (COLACE) 100 MG capsule Take 100 mg by mouth 2 (two) times daily as needed for mild constipation.     ENTRESTO  24-26 MG TAKE ONE TABLET BY MOUTH TWICE DAILY 60 tablet 11   famotidine  (PEPCID ) 20 MG tablet Take 20 mg by mouth 2 (two) times daily.     furosemide  (LASIX ) 40 MG tablet TAKE ONE TABLET BY MOUTH EVERY DAY 90 tablet 3   ipratropium (ATROVENT) 0.03 % nasal spray SMARTSIG:2 Spray(s) Both Nares Every 12 Hours     ivabradine  ([name]) 5 MG TABS tablet Take 0.5 tablets (2.5 mg total) by mouth 2 (two) times daily. If hr greater than 70 90 tablet 3   levothyroxine (SYNTHROID) 25  MCG tablet Take 25 mcg by mouth daily.     montelukast  (SINGULAIR ) 10 MG tablet Take 10 mg by mouth at bedtime. For allergies & congestion     Multiple Vitamins-Minerals (OCUVITE PO) Take 5 mg by mouth daily.     omeprazole  (PRILOSEC) 40 MG capsule TAKE ONE CAPSULE BY MOUTH TWICE DAILY 60 capsule 8   PARoxetine  (PAXIL ) 30 MG tablet Take 30 mg by mouth at bedtime.  1   Polyethyl Glycol-Propyl Glycol (SYSTANE OP) Place 1 drop into both eyes daily as needed (dry eyes).     rosuvastatin  (CRESTOR ) 5 MG tablet TAKE ONE TABLET BY MOUTH EVERY EVENING 90 tablet 1   sodium chloride  (OCEAN) 0.65 % SOLN nasal spray Place 1  spray into both nostrils daily as needed for congestion.     sodium fluoride (DENTA 5000 PLUS) 1.1 % CREA dental cream Place 1 Application onto teeth 2 (two) times daily.     spironolactone  (ALDACTONE ) 25 MG tablet TAKE ONE TABLET BY MOUTH daily 90 tablet 1   traMADol  (ULTRAM ) 50 MG tablet Take 50 mg by mouth every 6 (six) hours as needed for pain.     vitamin E 1000 UNIT capsule Take 1,000 Units by mouth daily.     No current facility-administered medications for this encounter.    Atrial Fibrillation Management history:  Previous antiarrhythmic drugs: none Previous cardioversions: none Previous ablations: none Anticoagulation history: none   ROS- All systems are reviewed and negative except as per the HPI above.  Physical Exam: BP (!) 98/52 (BP Location: Left Wrist)   Pulse 84   Ht 5\' 2"  (1.575 m)   Wt 118.8 kg   BMI 47.92 kg/m   GEN: Well nourished, well developed in no acute distress NECK: No JVD; No carotid bruits CARDIAC: Regular rate and rhythm with ectopy noted, no murmurs, rubs, gallops RESPIRATORY:  Clear to auscultation without rales, wheezing or rhonchi  ABDOMEN: Soft, non-tender, non-distended EXTREMITIES:  No edema; No deformity   EKG today demonstrates  Vent. rate 84 BPM PR interval 184 ms QRS duration 176 ms QT/QTcB 454/536 ms P-R-T axes 53 93 -37 Atrial-sensed ventricular-paced rhythm with frequent Premature ventricular complexes Abnormal ECG When compared with ECG of 26-Oct-2023 15:27, Premature ventricular complexes are now Present Confirmed by Minnie Amber ([id number]) on 04/05/2024 10:25:14 AM  Echo 09/01/22 demonstrated  1. Left ventricular ejection fraction, by estimation, is 20 to 25%. The  left ventricle has severely decreased function. The left ventricle  demonstrates global hypokinesis. The left ventricular internal cavity size  was mildly to moderately dilated. Left  ventricular diastolic parameters are consistent with Grade I diastolic   dysfunction (impaired relaxation).   2. Right ventricular systolic function is normal. The right ventricular  size is normal.   3. The mitral valve is normal in structure. Trivial mitral valve  regurgitation. No evidence of mitral stenosis.   4. The aortic valve is tricuspid. There is mild calcification of the  aortic valve. Aortic valve regurgitation is not visualized. Aortic valve  sclerosis/calcification is present, without any evidence of aortic  stenosis.   5. The inferior vena cava is normal in size with greater than 50%  respiratory variability, suggesting right atrial pressure of 3 mmHg.   6. Technically limited echo due to poor sound wave transmission. Definity   contrast used to help delineate endocardial borders.    ASSESSMENT & PLAN CHA2DS2-VASc Score = 4  The patient's score is based upon: CHF History: 1 HTN History: 1 Diabetes History:  0 Stroke History: 0 Vascular Disease History: 0 Age Score: 1 Gender Score: 1       ASSESSMENT AND PLAN: Paroxysmal Atrial Fibrillation (ICD10:  I48.0) The patient's CHA2DS2-VASc score is 4, indicating a 4.8% annual risk of stroke.    She is currently in AV paced rhythm. Continue current medication regimen without change. Will continue serial observation via device checks.   Secondary Hypercoagulable State (ICD10:  D68.69) The patient is at significant risk for stroke/thromboembolism based upon her CHA2DS2-VASc Score of 4.    We discussed risks vs benefits of anticoagulation. She had brief Afib noted on device. After discussion with patient's primary cardiologist, although subclinical Afib was noted with respect to patient's history will begin anticoagulation. Stop ASA. Begin Eliquis 5 mg BID. Draw CBC in 1 month.    Follow up as scheduled with HF clinic. Follow up Afib clinic December.    Minnie Amber, PA-C  [facility] [address] [place], [place] [address] [phone]

## 2024-04-05 NOTE — Patient Instructions (Signed)
 Stop aspirin   Start eliquis 5mg  twice daily   Have CBC drawn in 1 month at [facility] lab order attached

## 2024-04-06 ENCOUNTER — Observation Stay (HOSPITAL_BASED_OUTPATIENT_CLINIC_OR_DEPARTMENT_OTHER)

## 2024-04-06 DIAGNOSIS — R55 Syncope and collapse: Principal | ICD-10-CM

## 2024-04-06 DIAGNOSIS — E039 Hypothyroidism, unspecified: Secondary | ICD-10-CM

## 2024-04-06 LAB — ECHOCARDIOGRAM COMPLETE
AR max vel: 1.5 cm2
AV Area VTI: 1.14 cm2
AV Area mean vel: 1.5 cm2
AV Mean grad: 4 mmHg
AV Peak grad: 8.5 mmHg
Ao pk vel: 1.46 m/s
Calc EF: 15.9 %
Est EF: <20
Height: 62 in
S' Lateral: 6.75 cm
Single Plane A2C EF: 16.5 %
Single Plane A4C EF: 17.1 %
Weight: 4176.39 [oz_av]

## 2024-04-06 LAB — BASIC METABOLIC PANEL WITH GFR
Anion gap: 9 (ref 5–15)
BUN: 14 mg/dL (ref 8–23)
CO2: 24 mmol/L (ref 22–32)
Calcium: 7.3 mg/dL — ABNORMAL LOW (ref 8.9–10.3)
Chloride: 102 mmol/L (ref 98–111)
Creatinine, Ser: 1.09 mg/dL — ABNORMAL HIGH (ref 0.44–1.00)
GFR, Estimated: 54 mL/min — ABNORMAL LOW (ref >60)
Glucose, Bld: 89 mg/dL (ref 70–99)
Potassium: 2.9 mmol/L — ABNORMAL LOW (ref 3.5–5.1)
Sodium: 135 mmol/L (ref 135–145)

## 2024-04-06 LAB — PROCALCITONIN: Procalcitonin: <0.1 ng/mL

## 2024-04-06 LAB — TSH: TSH: 1.968 u[IU]/mL (ref 0.350–4.500)

## 2024-04-06 LAB — BRAIN NATRIURETIC PEPTIDE: B Natriuretic Peptide: 341.5 pg/mL — ABNORMAL HIGH (ref 0.0–100.0)

## 2024-04-06 LAB — POTASSIUM: Potassium: 4.4 mmol/L (ref 3.5–5.1)

## 2024-04-06 MED ORDER — CARVEDILOL 6.25 MG PO TABS: 3.1250 mg | ORAL_TABLET | Freq: Two times a day (BID) | ORAL | 0 refills | Status: DC

## 2024-04-06 MED ORDER — LEVOTHYROXINE SODIUM 25 MCG PO TABS
25.0000 ug | ORAL_TABLET | Freq: Every day | ORAL | Status: DC
  Administered 2024-04-06 – 2024-04-08 (×3): 25 ug via ORAL
  Filled 2024-04-06 (×3): qty 1

## 2024-04-06 MED ORDER — ACETAMINOPHEN 325 MG PO TABS
650.0000 mg | ORAL_TABLET | Freq: Four times a day (QID) | ORAL | Status: DC | PRN
  Administered 2024-04-07: 650 mg via ORAL
  Filled 2024-04-06: qty 2

## 2024-04-06 MED ORDER — POTASSIUM CHLORIDE CRYS ER 20 MEQ PO TBCR
30.0000 meq | EXTENDED_RELEASE_TABLET | ORAL | Status: AC
  Administered 2024-04-06 (×2): 30 meq via ORAL
  Filled 2024-04-06 (×2): qty 1

## 2024-04-06 MED ORDER — APIXABAN 5 MG PO TABS
5.0000 mg | ORAL_TABLET | Freq: Two times a day (BID) | ORAL | Status: DC
  Administered 2024-04-06 – 2024-04-08 (×5): 5 mg via ORAL
  Filled 2024-04-06 (×5): qty 1

## 2024-04-06 MED ORDER — LATANOPROST 0.005 % OP SOLN
1.0000 [drp] | Freq: Every day | OPHTHALMIC | Status: DC
  Administered 2024-04-06 – 2024-04-07 (×2): 1 [drp] via OPHTHALMIC
  Filled 2024-04-06 (×2): qty 2.5

## 2024-04-06 MED ORDER — ACETAMINOPHEN 650 MG RE SUPP: 650.0000 mg | Freq: Four times a day (QID) | RECTAL | Status: DC | PRN

## 2024-04-06 MED ORDER — PANTOPRAZOLE SODIUM 40 MG PO TBEC
40.0000 mg | DELAYED_RELEASE_TABLET | Freq: Every day | ORAL | Status: DC
  Administered 2024-04-06 – 2024-04-08 (×3): 40 mg via ORAL
  Filled 2024-04-06 (×3): qty 1

## 2024-04-06 MED ORDER — ALLOPURINOL 300 MG PO TABS
300.0000 mg | ORAL_TABLET | Freq: Every day | ORAL | Status: DC
  Administered 2024-04-06 – 2024-04-08 (×3): 300 mg via ORAL
  Filled 2024-04-06: qty 3
  Filled 2024-04-06 (×2): qty 1

## 2024-04-06 MED ORDER — SPIRONOLACTONE 25 MG PO TABS
25.0000 mg | ORAL_TABLET | Freq: Every day | ORAL | Status: DC
  Administered 2024-04-06: 25 mg via ORAL
  Filled 2024-04-06 (×2): qty 1

## 2024-04-06 MED ORDER — CARVEDILOL 3.125 MG PO TABS
3.1250 mg | ORAL_TABLET | Freq: Two times a day (BID) | ORAL | Status: DC
  Administered 2024-04-06 (×2): 3.125 mg via ORAL
  Filled 2024-04-06 (×3): qty 1

## 2024-04-06 MED ORDER — ROSUVASTATIN CALCIUM 5 MG PO TABS
5.0000 mg | ORAL_TABLET | Freq: Every evening | ORAL | Status: DC
  Administered 2024-04-06 – 2024-04-07 (×2): 5 mg via ORAL
  Filled 2024-04-06 (×2): qty 1

## 2024-04-06 MED ORDER — PERFLUTREN LIPID MICROSPHERE
1.0000 mL | INTRAVENOUS | Status: AC | PRN
  Administered 2024-04-06: 2 mL via INTRAVENOUS

## 2024-04-06 MED ORDER — FUROSEMIDE 20 MG PO TABS
40.0000 mg | ORAL_TABLET | Freq: Every day | ORAL | Status: DC
  Administered 2024-04-06: 40 mg via ORAL
  Filled 2024-04-06: qty 2

## 2024-04-06 MED ORDER — SACUBITRIL-VALSARTAN 24-26 MG PO TABS
1.0000 | ORAL_TABLET | Freq: Two times a day (BID) | ORAL | Status: DC
  Administered 2024-04-06 – 2024-04-08 (×3): 1 via ORAL
  Filled 2024-04-06 (×4): qty 1

## 2024-04-06 NOTE — Progress Notes (Signed)
 No charge note  Patient seen and examined this morning, admitted overnight, H&P reviewed and I agree with the assessment and plan.  73 year old female with advanced heart failure, seen in the CHF clinic here with few presyncopal episodes, about 4 in the last 2 weeks, most recent 1 the day prior to admission.  She has lost, intentionally, about 30 pounds and has noted that her blood pressure has been on the low side.  Will adjust medications, monitor blood pressure today. Possible home tomorrow  Kadience Macchi M. Aldona Amel, MD, PhD [facility]  Between 7 am - 7 pm you can contact me via Amion (for emergencies) or Securechat (non urgent matters).  I am not available 7 pm - 7 am, please contact night coverage MD/APP via Amion

## 2024-04-06 NOTE — ED Notes (Signed)
 CCMD notified of pt being off the floor for procedure

## 2024-04-06 NOTE — ED Notes (Signed)
 Resent potassium to lab

## 2024-04-06 NOTE — ED Notes (Signed)
 Lab called to report potassium is hemolyzed and will need new sample

## 2024-04-06 NOTE — ED Notes (Signed)
 ED TO INPATIENT HANDOFF REPORT  ED Nurse Name and Phone #: Nerissa Bannister [phone]  S Name/Age/Gender [name] 73 y.o. female Room/Bed: 041C/041C  Code Status   Code Status: Full Code  Home/SNF/Other Home Patient oriented to: self, place, time, and situation Is this baseline? Yes   Triage Complete: Triage complete  Chief Complaint Near syncope [R55]  Triage Note Patient c/o atrial fibrillation and "seizure like activity" at eye doctor this AM.  Patient does have AICD and is unsure if it went off or not.  Patient gives verbal consent for MSE.    Allergies Allergies  Allergen Reactions   Alphagan [Brimonidine]     Irritation    Mobic [Meloxicam] Other (See Comments)    Cannot take due to heart issue   Nsaids Other (See Comments)    Cannot take due to heart issue   Polyvinyl Alcohol Other (See Comments)    Travatan and Alphagan(irritation)   Statins Other (See Comments)    Per pt her cardiologist told her not to take   Tolmetin Other (See Comments)    Can not take due to heart issue   Travatan Z [Travoprost (Bak Free)]     Irritation   Tape Rash    Uncoded Allergy. Allergen: Tape Blister from clear tapes.    Level of Care/Admitting Diagnosis ED Disposition     ED Disposition  Admit   Condition  --   Comment  Hospital Area: [facility] [[id number]]  Level of Care: Progressive [102]  Admit to Progressive based on following criteria: CARDIOVASCULAR & THORACIC of moderate stability with acute coronary syndrome symptoms/low risk myocardial infarction/hypertensive urgency/arrhythmias/heart failure potentially compromising stability and stable post cardiovascular intervention patients.  May place patient in observation at [facility] or [facility] if equivalent level of care is available:: Yes  Covid Evaluation: Asymptomatic - no recent exposure (last 10 days) testing not required  Diagnosis: Near syncope [phone]  Admitting Physician: Juliette Oh [[id number]]  Attending Physician: Juliette Oh [[id number]]          B Medical/Surgery History Past Medical History:  Diagnosis Date   Aortic dissection (HCC) 12/2008   Type B   Arthritis    lower back   Atrial fibrillation (HCC)    post op (left thoracotomy) 11/12 req amio/DCCV   Breast cancer (HCC) 07/2009   Stage 3 lumpectomy, radiation, chemotherapy; finished chemo October, felt to be in remission   Cardiac defibrillator in place    St. Jude [place]) 9/12; LV lead placement unsuccessful;  s/p left thoracotomy with placement of epicardial LV lead 09/2011 (Dr. PVT)   CHF (congestive heart failure) (HCC)    Chronic systolic dysfunction of left ventricle    Depression    DVT (deep venous thrombosis) (HCC) 10/18/2010   GERD (gastroesophageal reflux disease)    Glaucoma    Hiatal hernia    small hiatal hernia   History of shingles    Hyperlipidemia    Hypertension    Left bundle branch block    Nonischemic cardiomyopathy (HCC)    Admission 12/11 with EF 25%, normal coronary arteries by cath 12/11; NICM presumed to be secondary to chemotherapy for breast cancer   Obesity    [name]    with exertion   Past Surgical History:  Procedure Laterality Date   BIV ICD GENERATOR CHANGEOUT N/A 05/22/2023   Procedure: BIV ICD GENERATOR CHANGEOUT;  Surgeon: Lei Pump, MD;  [facility];  Service: Cardiovascular;  Laterality: N/A;   BREAST LUMPECTOMY     Right breast   CARDIAC CATHETERIZATION     2011   CARDIAC DEFIBRILLATOR PLACEMENT     EP IMPLANTABLE DEVICE N/A 01/12/2016   STJ ICD Unify Assura gen change, Dr. Nunzio Belch   OOPHORECTOMY     Single   THORACOTOMY  09/16/2011   Procedure: THORACOTOMY MAJOR;  Surgeon: Shawn Delay, MD;  Location: [facility] OR;  Service: Thoracic;  Laterality: Left;  left anterolateral Thoracotomy for placement of St. Jude epicardial pacing lead    TONSILLECTOMY     TUBAL LIGATION     Bilateral     A IV  Location/Drains/Wounds Patient Lines/Drains/Airways Status     Active Line/Drains/Airways     Name Placement date Placement time Site Days   Peripheral IV 04/06/24 20 G 1" Anterior;Left Forearm 04/06/24  0237  Forearm  less than 1   External Urinary Catheter 04/06/24  1627  --  less than 1            Intake/Output Last 24 hours  Intake/Output Summary (Last 24 hours) at 04/06/2024 2045 Last data filed at 04/06/2024 1500 Gross per 24 hour  Intake --  Output 550 ml  Net -550 ml    Labs/Imaging Results for orders placed or performed during the hospital encounter of 04/05/24 (from the past 48 hours)  CBC WITH DIFFERENTIAL     Status: Abnormal   Collection Time: 04/05/24  4:31 PM  Result Value Ref Range   WBC 7.7 4.0 - 10.5 K/uL   RBC 4.58 3.87 - 5.11 MIL/uL   Hemoglobin 14.2 12.0 - 15.0 g/dL   HCT 40.9 81.1 - 91.4 %   MCV 94.1 80.0 - 100.0 fL   MCH 31.0 26.0 - 34.0 pg   MCHC 32.9 30.0 - 36.0 g/dL   RDW 78.2 95.6 - 21.3 %   Platelets 132 (L) 150 - 400 K/uL    Comment: REPEATED TO VERIFY   nRBC 0.0 0.0 - 0.2 %   Neutrophils Relative % 70 %   Neutro Abs 5.4 1.7 - 7.7 K/uL   Lymphocytes Relative 20 %   Lymphs Abs 1.6 0.7 - 4.0 K/uL   Monocytes Relative 7 %   Monocytes Absolute 0.5 0.1 - 1.0 K/uL   Eosinophils Relative 2 %   Eosinophils Absolute 0.1 0.0 - 0.5 K/uL   Basophils Relative 1 %   Basophils Absolute 0.1 0.0 - 0.1 K/uL   Immature Granulocytes 0 %   Abs Immature Granulocytes 0.02 0.00 - 0.07 K/uL    Comment: Performed at [facility], [address], [place], [place] [address]  Comprehensive metabolic panel     Status: Abnormal   Collection Time: 04/05/24  4:31 PM  Result Value Ref Range   Sodium 141 135 - 145 mmol/L   Potassium 3.8 3.5 - 5.1 mmol/L   Chloride 100 98 - 111 mmol/L   CO2 27 22 - 32 mmol/L   Glucose, Bld 116 (H) 70 - 99 mg/dL    Comment: Glucose reference range applies only to samples taken after fasting for at least 8 hours.   BUN 14 8 -  23 mg/dL   Creatinine, Ser 8.46 (H) 0.44 - 1.00 mg/dL   Calcium  9.2 8.9 - 10.3 mg/dL   Total Protein 7.1 6.5 - 8.1 g/dL   Albumin 3.2 (L) 3.5 - 5.0 g/dL   AST 24 15 - 41 U/L   ALT 11 0 - 44 U/L  Alkaline Phosphatase 79 38 - 126 U/L   Total Bilirubin 0.8 0.0 - 1.2 mg/dL   GFR, Estimated 38 (L) >60 mL/min    Comment: (NOTE) Calculated using the CKD-EPI Creatinine Equation (2021)    Anion gap 14 5 - 15    Comment: Performed at [facility], [address], [place], [place] [address]  Troponin I (High Sensitivity)     Status: Abnormal   Collection Time: 04/05/24  4:31 PM  Result Value Ref Range   Troponin I (High Sensitivity) 61 (H) <18 ng/L    Comment: (NOTE) Elevated high sensitivity troponin I (hsTnI) values and significant  changes across serial measurements may suggest ACS but many other  chronic and acute conditions are known to elevate hsTnI results.  Refer to the "Links" section for chest pain algorithms and additional  guidance. Performed at [facility], [address], [place], [place] [address]   Magnesium      Status: None   Collection Time: 04/05/24  4:31 PM  Result Value Ref Range   Magnesium  1.8 1.7 - 2.4 mg/dL    Comment: Performed at [facility], [address], [place], [place] [address]  Troponin I (High Sensitivity)     Status: Abnormal   Collection Time: 04/05/24  8:10 PM  Result Value Ref Range   Troponin I (High Sensitivity) 57 (H) <18 ng/L    Comment: (NOTE) Elevated high sensitivity troponin I (hsTnI) values and significant  changes across serial measurements may suggest ACS but many other  chronic and acute conditions are known to elevate hsTnI results.  Refer to the "Links" section for chest pain algorithms and additional  guidance. Performed at [facility], [address], [place], [place] [address]   TSH     Status: None   Collection Time: 04/05/24  8:10 PM  Result Value Ref Range   TSH 1.968 0.350 - 4.500 uIU/mL     Comment: Performed by a 3rd Generation assay with a functional sensitivity of <=0.01 uIU/mL. Performed at [facility], [address] [place], [place] [address]   Brain natriuretic peptide     Status: Abnormal   Collection Time: 04/06/24  4:55 AM  Result Value Ref Range   B Natriuretic Peptide 341.5 (H) 0.0 - 100.0 pg/mL    Comment: Performed at [facility], [address], [place], [place] [address]  Basic metabolic panel     Status: Abnormal   Collection Time: 04/06/24  4:55 AM  Result Value Ref Range   Sodium 135 135 - 145 mmol/L   Potassium 2.9 (L) 3.5 - 5.1 mmol/L   Chloride 102 98 - 111 mmol/L   CO2 24 22 - 32 mmol/L   Glucose, Bld 89 70 - 99 mg/dL    Comment: Glucose reference range applies only to samples taken after fasting for at least 8 hours.   BUN 14 8 - 23 mg/dL   Creatinine, Ser 5.28 (H) 0.44 - 1.00 mg/dL   Calcium  7.3 (L) 8.9 - 10.3 mg/dL   GFR, Estimated 54 (L) >60 mL/min    Comment: (NOTE) Calculated using the CKD-EPI Creatinine Equation (2021)    Anion gap 9 5 - 15    Comment: Performed at [facility], [address], [place], [place] [address]  Procalcitonin     Status: None   Collection Time: 04/06/24  4:55 AM  Result Value Ref Range   Procalcitonin <0.10 ng/mL  Comment:        Interpretation: PCT (Procalcitonin) <= 0.5 ng/mL: Systemic infection (sepsis) is not likely. Local bacterial infection is possible. (NOTE)       Sepsis PCT Algorithm           Lower Respiratory Tract                                      Infection PCT Algorithm    ----------------------------     ----------------------------         PCT < 0.25 ng/mL                PCT < 0.10 ng/mL          Strongly encourage             Strongly discourage   discontinuation of antibiotics    initiation of antibiotics    ----------------------------     -----------------------------       PCT 0.25 - 0.50 ng/mL            PCT 0.10 - 0.25 ng/mL               OR       >80%  decrease in PCT            Discourage initiation of                                            antibiotics      Encourage discontinuation           of antibiotics    ----------------------------     -----------------------------         PCT >= 0.50 ng/mL              PCT 0.26 - 0.50 ng/mL               AND        <80% decrease in PCT             Encourage initiation of                                             antibiotics       Encourage continuation           of antibiotics    ----------------------------     -----------------------------        PCT >= 0.50 ng/mL                  PCT > 0.50 ng/mL               AND         increase in PCT                  Strongly encourage                                      initiation of antibiotics    Strongly encourage escalation           of antibiotics                                     -----------------------------  PCT <= 0.25 ng/mL                                                 OR                                        > 80% decrease in PCT                                      Discontinue / Do not initiate                                             antibiotics  Performed at [facility], [address] [place], [place] [address]   Potassium     Status: None   Collection Time: 04/06/24  6:49 PM  Result Value Ref Range   Potassium 4.4 3.5 - 5.1 mmol/L    Comment: Performed at [facility], [address] [place], [place] [address]   ECHOCARDIOGRAM COMPLETE Result Date: 04/06/2024    ECHOCARDIOGRAM REPORT   Patient Name:   [name] Date of Exam: 04/06/2024 Medical Rec #:  [id number]      Height:       62.0 in Accession #:    [id number]     Weight:       261.0 lb Date of Birth:  1951-05-28      BSA:          2.141 m Patient Age:    73 years       BP:           126/82 mmHg Patient Gender: F              HR:           80 bpm. Exam Location:  Inpatient Procedure: 2D Echo, Cardiac  Doppler, Color Doppler and Intracardiac            Opacification Agent (Both Spectral and Color Flow Doppler were            utilized during procedure). Indications:    Near Syncope  History:        Patient has prior history of Echocardiogram examinations, most                 recent 09/01/2022. CHF and Nonischemic Cardiomyopathy, Aortic                 Dissection and DVT, Arrythmias:Atrial Fibrillation,                 Signs/Symptoms:[name]; Risk Factors:Dyslipidemia                 and Hypertension.  Sonographer:    Travis Friedman RDCS Referring Phys: [id number] Juliette Oh  Sonographer Comments: Patient is obese. IMPRESSIONS  1. No mural apical thrombus with definity . Left ventricular ejection fraction, by estimation, is <20%. The left ventricle has severely decreased function. The left ventricle demonstrates global hypokinesis. The left ventricular internal cavity size was severely dilated. Left ventricular diastolic  parameters are indeterminate.  2. Device lead in RV. Right ventricular systolic function is normal. The right ventricular size is normal.  3. Left atrial size was moderately dilated.  4. Functional MR from LV dysfunction and reduced posterior leaflet motion apically displaced coaptation point. The mitral valve is abnormal. Mild to moderate mitral valve regurgitation. No evidence of mitral stenosis.  5. Mean gradient 4 peak 9 mmHg likely mild low flow AS . The aortic valve is tricuspid. There is mild calcification of the aortic valve. There is mild thickening of the aortic valve. Aortic valve regurgitation is not visualized. Aortic valve sclerosis is present, with no evidence of aortic valve stenosis.  6. The inferior vena cava is normal in size with greater than 50% respiratory variability, suggesting right atrial pressure of 3 mmHg. FINDINGS  Left Ventricle: No mural apical thrombus with definity . Left ventricular ejection fraction, by estimation, is <20%. The left ventricle has severely  decreased function. The left ventricle demonstrates global hypokinesis. Strain was performed and the global longitudinal strain is indeterminate. The left ventricular internal cavity size was severely dilated. There is no left ventricular hypertrophy. Left ventricular diastolic parameters are indeterminate. Right Ventricle: Device lead in RV. The right ventricular size is normal. No increase in right ventricular wall thickness. Right ventricular systolic function is normal. Left Atrium: Left atrial size was moderately dilated. Right Atrium: Right atrial size was normal in size. Pericardium: There is no evidence of pericardial effusion. Mitral Valve: Functional MR from LV dysfunction and reduced posterior leaflet motion apically displaced coaptation point. The mitral valve is abnormal. There is mild thickening of the mitral valve leaflet(s). There is mild calcification of the mitral valve leaflet(s). Mild to moderate mitral valve regurgitation. No evidence of mitral valve stenosis. Tricuspid Valve: The tricuspid valve is normal in structure. Tricuspid valve regurgitation is mild . No evidence of tricuspid stenosis. Aortic Valve: Mean gradient 4 peak 9 mmHg likely mild low flow AS. The aortic valve is tricuspid. There is mild calcification of the aortic valve. There is mild thickening of the aortic valve. Aortic valve regurgitation is not visualized. Aortic valve sclerosis is present, with no evidence of aortic valve stenosis. Aortic valve mean gradient measures 4.0 mmHg. Aortic valve peak gradient measures 8.5 mmHg. Aortic valve area, by VTI measures 1.14 cm. Pulmonic Valve: The pulmonic valve was normal in structure. Pulmonic valve regurgitation is not visualized. No evidence of pulmonic stenosis. Aorta: The aortic root is normal in size and structure. Venous: The inferior vena cava is normal in size with greater than 50% respiratory variability, suggesting right atrial pressure of 3 mmHg. IAS/Shunts: No atrial  level shunt detected by color flow Doppler. Additional Comments: 3D was performed not requiring image post processing on an independent workstation and was indeterminate.  LEFT VENTRICLE PLAX 2D LVIDd:         7.30 cm LVIDs:         6.75 cm LV PW:         0.90 cm LV IVS:        1.05 cm LVOT diam:     2.40 cm LV SV:         36 LV SV Index:   17 LVOT Area:     4.52 cm  LV Volumes (MOD) LV vol d, MOD A2C: 515.0 ml LV vol d, MOD A4C: 519.0 ml LV vol s, MOD A2C: 430.0 ml LV vol s, MOD A4C: 430.0 ml LV SV MOD A2C:     85.0 ml  LV SV MOD A4C:     519.0 ml LV SV MOD BP:      82.1 ml RIGHT VENTRICLE          IVC RV Basal diam:  3.50 cm  IVC diam: 2.10 cm TAPSE (M-mode): 1.1 cm LEFT ATRIUM              Index        RIGHT ATRIUM           Index LA diam:        4.30 cm  2.01 cm/m   RA Area:     10.80 cm LA Vol (A2C):   85.9 ml  40.13 ml/m  RA Volume:   20.50 ml  9.58 ml/m LA Vol (A4C):   121.0 ml 56.52 ml/m LA Biplane Vol: 102.0 ml 47.65 ml/m  AORTIC VALVE AV Area (Vmax):    1.50 cm AV Area (Vmean):   1.50 cm AV Area (VTI):     1.14 cm AV Vmax:           146.00 cm/s AV Vmean:          96.700 cm/s AV VTI:            0.315 m AV Peak Grad:      8.5 mmHg AV Mean Grad:      4.0 mmHg LVOT Vmax:         48.50 cm/s LVOT Vmean:        32.000 cm/s LVOT VTI:          0.080 m LVOT/AV VTI ratio: 0.25  AORTA Ao Root diam: 3.50 cm Ao Asc diam:  2.90 cm  SHUNTS Systemic VTI:  0.08 m Systemic Diam: 2.40 cm Janelle Mediate MD Electronically signed by Janelle Mediate MD Signature Date/Time: 04/06/2024/12:27:47 PM    Final    DG Chest Port 1 View Result Date: 04/05/2024 CLINICAL DATA:  [name]. EXAM: PORTABLE CHEST 1 VIEW COMPARISON:  01/10/2019. FINDINGS: Stable cardiomegaly and left ventricular prominence. Left-sided pacemaker/AICD in place. Mild left basilar opacity could reflect overlying soft tissue, atelectasis, or infiltrate. The right lung is clear. No sizable pleural effusion or pneumothorax. Surgical clips overlying the  right breast. No acute osseous abnormality. IMPRESSION: Stable cardiomegaly. Mild left basilar opacity could reflect overlying soft tissue, atelectasis, or infiltrate. Electronically Signed   By: Mannie Seek M.D.   On: 04/05/2024 15:52    Pending Labs Unresulted Labs (From admission, onward)    None       Vitals/Pain Today's Vitals   04/06/24 1930 04/06/24 1937 04/06/24 1945 04/06/24 2015  BP: 98/86  (!) 106/94 111/78  Pulse: 65  68 82  Resp: 18  (!) 21 18  Temp:    98.1 F (36.7 C)  TempSrc:    Oral  SpO2: 98%  99% 97%  Weight:      Height:      PainSc:  0-No pain  0-No pain    Isolation Precautions No active isolations  Medications Medications  allopurinol  (ZYLOPRIM ) tablet 300 mg (300 mg Oral Given 04/06/24 1134)  rosuvastatin  (CRESTOR ) tablet 5 mg (5 mg Oral Given 04/06/24 1736)  levothyroxine (SYNTHROID) tablet 25 mcg (25 mcg Oral Given 04/06/24 0511)  pantoprazole  (PROTONIX ) EC tablet 40 mg (40 mg Oral Given 04/06/24 1134)  latanoprost (XALATAN) 0.005 % ophthalmic solution 1 drop (has no administration in time range)  apixaban  (ELIQUIS ) tablet 5 mg (5 mg Oral Given 04/06/24 0449)  acetaminophen  (TYLENOL ) tablet 650 mg (  has no administration in time range)    Or  acetaminophen  (TYLENOL ) suppository 650 mg (has no administration in time range)  carvedilol  (COREG ) tablet 3.125 mg (3.125 mg Oral Given 04/06/24 1736)  spironolactone  (ALDACTONE ) tablet 25 mg (25 mg Oral Given 04/06/24 1133)  perflutren  lipid microspheres (DEFINITY ) IV suspension (2 mLs Intravenous Given 04/06/24 1118)  sacubitril -valsartan  (ENTRESTO ) 24-26 mg per tablet (has no administration in time range)  potassium chloride  (KLOR-CON  M) CR tablet 30 mEq (30 mEq Oral Given 04/06/24 0838)    Mobility walks     Focused Assessments Cardiac Assessment Handoff:  Cardiac Rhythm: Normal sinus rhythm Lab Results  Component Value Date   CKTOTAL 42 10/05/2010   CKMB 0.7 10/05/2010   TROPONINI 0.02        NO  INDICATION OF MYOCARDIAL INJURY. 10/05/2010   Lab Results  Component Value Date   DDIMER 7.39 (H) 08/17/2011   Does the Patient currently have chest pain? No    R Recommendations: See Admitting Provider Note  Report given to:   Additional Notes:

## 2024-04-06 NOTE — ED Notes (Signed)
 Pt returned from echo. RR even and unlabored.

## 2024-04-06 NOTE — H&P (Signed)
 History and Physical    [name] MWU:[id number] DOB: 11/15/1950 DOA: 04/05/2024  PCP: Abbe Hoard., MD  Patient coming from: Home  Chief Complaint: Near syncope  HPI: [name] is a 72 y.o. 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  Blood pressure soft with systolic in the 90s.  Not tachycardic or hypoxic.  Labs showing no leukocytosis or anemia, platelet count mildly low and stable, potassium and magnesium  within normal range, no hypoglycemia, creatinine 1.4 (baseline around 1.2), troponin 61> 57, TSH normal.  Chest x-ray showing mild left basilar opacity which could reflect overlying soft tissue, atelectasis, or infiltrate.  Pacemaker interrogation revealed no arrhythmia at the time of the event, however, she has a high burden of PVCs.  ED physician discussed the case with on-call cardiologist who recommended admission for observation overnight.  [facility] called to admit.  Patient states she went to her ophthalmologist's office yesterday and was sitting when all of a sudden she started feeling lightheaded and felt like she was going to pass out.  This lasted for about a minute.  Denies palpitations, shortness of breath, or chest pain.  Denies loss of consciousness.  States her vital signs were checked at that time and she was told that her blood pressure and heart rate were "all over the place."  Patient states prior to going to her doctor's office yesterday she had checked her heart rate in the morning and she thinks it was in the 20s.  She reports history of chronic cough at night.  Denies fevers, nausea,  vomiting, abdominal pain, or diarrhea.  No other complaints.  Review of Systems:  Review of Systems  All other systems reviewed and are negative.   Past Medical History:  Diagnosis Date   Aortic dissection (HCC) 12/2008   Type B   Arthritis    lower back   Atrial fibrillation (HCC)    post op (left thoracotomy) 11/12 req amio/DCCV   Breast cancer (HCC) 07/2009   Stage 3 lumpectomy, radiation, chemotherapy; finished chemo October, felt to be in remission   Cardiac defibrillator in place    St. Jude (JA) 9/12; LV lead placement unsuccessful;  s/p left thoracotomy with placement of epicardial LV lead 09/2011 (Dr. PVT)   CHF (congestive heart failure) (HCC)    Chronic systolic dysfunction of left ventricle    Depression    DVT (deep venous thrombosis) (HCC) 10/18/2010   GERD (gastroesophageal reflux disease)    Glaucoma    Hiatal hernia    small hiatal hernia   History of shingles    Hyperlipidemia    Hypertension    Left bundle branch block    Nonischemic cardiomyopathy (HCC)    Admission 12/11 with EF 25%, normal coronary arteries by cath 12/11; NICM presumed to be secondary to chemotherapy for breast cancer   Obesity    [name]    with exertion    Past Surgical History:  Procedure Laterality Date   BIV ICD GENERATOR CHANGEOUT N/A 05/22/2023   Procedure: BIV ICD GENERATOR CHANGEOUT;  Surgeon: Lei Pump, MD;  Location: [facility] INVASIVE CV LAB;  Service: Cardiovascular;  Laterality: N/A;   BREAST LUMPECTOMY  Right breast   CARDIAC CATHETERIZATION     2011   CARDIAC DEFIBRILLATOR PLACEMENT     EP IMPLANTABLE DEVICE N/A 01/12/2016   STJ ICD Unify Assura gen change, Dr. Nunzio Belch   OOPHORECTOMY     Single   THORACOTOMY  09/16/2011   Procedure: THORACOTOMY MAJOR;  Surgeon: Shawn Delay, MD;  Location: [facility] OR;  Service: Thoracic;  Laterality: Left;  left anterolateral Thoracotomy for placement of St. Jude epicardial pacing lead    TONSILLECTOMY      TUBAL LIGATION     Bilateral     reports that she has never smoked. She has never used smokeless tobacco. She reports that she does not drink alcohol and does not use drugs.  Allergies  Allergen Reactions   Alphagan [Brimonidine]     Irritation    Mobic [Meloxicam] Other (See Comments)    Cannot take due to heart issue   Nsaids Other (See Comments)    Cannot take due to heart issue   Polyvinyl Alcohol Other (See Comments)    Travatan and Alphagan(irritation)   Statins Other (See Comments)    Per pt her cardiologist told her not to take   Tolmetin Other (See Comments)    Can not take due to heart issue   Travatan Z [Travoprost (Bak Free)]     Irritation   Tape Rash    Uncoded Allergy. Allergen: Tape Blister from clear tapes.    Family History  Problem Relation Age of Onset   Hypertension Mother    Glaucoma Mother    Atrial fibrillation Mother    Hypertension Father    Hypertension Brother    Hypertension Maternal Grandfather    Heart attack Maternal Grandfather        MI   Coronary artery disease Maternal Grandfather    Hypertension Paternal Grandfather    Heart attack Paternal Grandfather        MI   Coronary artery disease Paternal Grandfather    Hypertension Brother     Prior to Admission medications   Medication Sig Start Date End Date Taking? Authorizing Provider  allopurinol  (ZYLOPRIM ) 300 MG tablet Take 300 mg by mouth daily.   Yes [provider]  bimatoprost  (LUMIGAN ) 0.03 % ophthalmic solution Place 1 drop into both eyes at bedtime. Reported on 01/12/2016   Yes [provider]  Calcium  Carbonate-Vitamin D (CALCIUM -VITAMIN D3) 600-200 MG-UNIT TABS Take 1 tablet by mouth 2 (two) times daily.   Yes [provider]  carvedilol  (COREG ) 6.25 MG tablet TAKE ONE TABLET BY MOUTH TWICE DAILY 02/27/24  Yes Bensimhon, Rheta Celestine, MD  cetirizine (ZYRTEC) 10 MG tablet Take 10 mg by mouth at bedtime. 04/08/19  Yes [provider]   Cholecalciferol (VITAMIN D) 125 MCG (5000 UT) CAPS Take 5,000 Units by mouth daily.   Yes [provider]  clotrimazole-betamethasone (LOTRISONE) cream Apply 1 Application topically 2 (two) times daily as needed (Rash).   Yes [provider]  cyanocobalamin (VITAMIN B12) 1000 MCG tablet Take 1,000 mcg by mouth daily.   Yes [provider]  dextromethorphan (DELSYM) 30 MG/5ML liquid Take 5 mLs by mouth daily as needed for cough.   Yes [provider]  diclofenac  sodium (VOLTAREN ) 1 % GEL APPLY THREE GRAMS TO THREE LARGE JOINTS UP TO THREE TIMES DAILY AS NEEDED 04/04/18  Yes Romayne Clubs, PA-C  docusate sodium  (COLACE) 100 MG capsule Take 100 mg by mouth 2 (two) times daily as needed for mild  constipation.   Yes [provider]  ENTRESTO  24-26 MG TAKE ONE TABLET BY MOUTH TWICE DAILY 03/15/24  Yes Bensimhon, Rheta Celestine, MD  famotidine  (PEPCID ) 20 MG tablet Take 20 mg by mouth 2 (two) times daily.   Yes [provider]  furosemide  (LASIX ) 40 MG tablet TAKE ONE TABLET BY MOUTH EVERY DAY 03/15/24  Yes Bensimhon, Daniel R, MD  ipratropium (ATROVENT) 0.03 % nasal spray Place 2 sprays into both nostrils daily as needed for rhinitis.   Yes [provider]  ivabradine  ([name]) 5 MG TABS tablet Take 0.5 tablets (2.5 mg total) by mouth 2 (two) times daily. If hr greater than 70 Patient taking differently: Take 2.5 mg by mouth See admin instructions. Take 1 tablet by mouth twice a day if HR is greater than 70 07/11/23  Yes Bensimhon, Daniel R, MD  levothyroxine (SYNTHROID) 25 MCG tablet Take 25 mcg by mouth daily. 01/15/20  Yes [provider]  montelukast  (SINGULAIR ) 10 MG tablet Take 10 mg by mouth at bedtime. For allergies & congestion   Yes [provider]  Multiple Vitamins-Minerals (OCUVITE PO) Take 5 mg by mouth daily.   Yes [provider]  omeprazole  (PRILOSEC) 40 MG capsule TAKE ONE CAPSULE BY MOUTH TWICE DAILY 08/04/20   Yes Kozlow, Rema Care, MD  PARoxetine  (PAXIL ) 30 MG tablet Take 30 mg by mouth at bedtime. 02/10/16  Yes [provider]  Polyethyl Glycol-Propyl Glycol (SYSTANE OP) Place 1 drop into both eyes daily as needed (dry eyes).   Yes [provider]  rosuvastatin  (CRESTOR ) 5 MG tablet TAKE ONE TABLET BY MOUTH EVERY EVENING 11/07/23  Yes Bensimhon, Rheta Celestine, MD  sodium chloride  (OCEAN) 0.65 % SOLN nasal spray Place 1 spray into both nostrils daily as needed for congestion.   Yes [provider]  sodium fluoride (DENTA 5000 PLUS) 1.1 % CREA dental cream Place 1 Application onto teeth 2 (two) times daily.   Yes [provider]  spironolactone  (ALDACTONE ) 25 MG tablet TAKE ONE TABLET BY MOUTH daily 11/07/23  Yes Bensimhon, Rheta Celestine, MD  traMADol  (ULTRAM ) 50 MG tablet Take 50 mg by mouth every 6 (six) hours as needed for pain.   Yes [provider]  vitamin E 1000 UNIT capsule Take 1,000 Units by mouth daily.   Yes [provider]  apixaban  (ELIQUIS ) 5 MG TABS tablet Take 1 tablet (5 mg total) by mouth 2 (two) times daily. Patient not taking: Reported on 04/05/2024 04/05/24   Nathanel Bal, PA-C  COLCRYS  0.6 MG tablet TAKE ONE TABLET BY MOUTH EVERY DAY Patient not taking: Reported on 04/05/2024 07/03/18   Nicholas Bari, MD  cyclobenzaprine  (FLEXERIL ) 10 MG tablet Take 1 tablet (10 mg total) by mouth 3 (three) times daily as needed for muscle spasms. Patient not taking: Reported on 04/05/2024 12/27/13   Bensimhon, Rheta Celestine, MD  ranitidine (ZANTAC) 300 MG capsule Take 300 mg by mouth daily as needed. Acid indigestion  01/24/12  [provider]    Physical Exam: Vitals:   04/05/24 2145 04/05/24 2245 04/05/24 2300 04/05/24 2315  BP:  (!) 82/72 93/74 (!) 79/63  Pulse: 61 74 78 72  Resp: (!) 24 20 19  (!) 21  Temp:      TempSrc:      SpO2: 99% 97% 96% 99%  Weight:      Height:        Physical Exam Vitals reviewed.  Constitutional:      Appearance:  She  is not toxic-appearing.  HENT:     Head: Normocephalic and atraumatic.  Eyes:     Extraocular Movements: Extraocular movements intact.  Cardiovascular:     Rate and Rhythm: Normal rate and regular rhythm.     Pulses: Normal pulses.  Pulmonary:     Effort: Pulmonary effort is normal. No respiratory distress.     Breath sounds: Normal breath sounds. No wheezing or rales.  Abdominal:     General: Bowel sounds are normal. There is no distension.     Palpations: Abdomen is soft.     Tenderness: There is no abdominal tenderness. There is no guarding.  Musculoskeletal:     Cervical back: Normal range of motion.     Right lower leg: No edema.     Left lower leg: No edema.  Skin:    General: Skin is warm and dry.  Neurological:     General: No focal deficit present.     Mental Status: She is alert and oriented to person, place, and time.     Labs on Admission: I have personally reviewed following labs and imaging studies  CBC: Recent Labs  Lab 04/05/24 1631  WBC 7.7  NEUTROABS 5.4  HGB 14.2  HCT 43.1  MCV 94.1  PLT 132*   Basic Metabolic Panel: Recent Labs  Lab 04/05/24 1631  NA 141  K 3.8  CL 100  CO2 27  GLUCOSE 116*  BUN 14  CREATININE 1.45*  CALCIUM  9.2  MG 1.8   GFR: Estimated Creatinine Clearance: 42.2 mL/min (A) (by C-G formula based on SCr of 1.45 mg/dL (H)). Liver Function Tests: Recent Labs  Lab 04/05/24 1631  AST 24  ALT 11  ALKPHOS 79  BILITOT 0.8  PROT 7.1  ALBUMIN 3.2*   No results for input(s): "LIPASE", "AMYLASE" in the last 168 hours. No results for input(s): "AMMONIA" in the last 168 hours. Coagulation Profile: No results for input(s): "INR", "PROTIME" in the last 168 hours. Cardiac Enzymes: No results for input(s): "CKTOTAL", "CKMB", "CKMBINDEX", "TROPONINI" in the last 168 hours. BNP (last 3 results) No results for input(s): "PROBNP" in the last 8760 hours. HbA1C: No results for input(s): "HGBA1C" in the last 72  hours. CBG: No results for input(s): "GLUCAP" in the last 168 hours. Lipid Profile: No results for input(s): "CHOL", "HDL", "LDLCALC", "TRIG", "CHOLHDL", "LDLDIRECT" in the last 72 hours. Thyroid  Function Tests: Recent Labs    04/05/24 2010  TSH 1.968   Anemia Panel: No results for input(s): "VITAMINB12", "FOLATE", "FERRITIN", "TIBC", "IRON", "RETICCTPCT" in the last 72 hours. Urine analysis:    Component Value Date/Time   COLORURINE YELLOW 09/14/2011 1215   APPEARANCEUR CLEAR 09/14/2011 1215   LABSPEC 1.013 09/14/2011 1215   PHURINE 5.0 09/14/2011 1215   GLUCOSEU NEGATIVE 09/14/2011 1215   HGBUR NEGATIVE 09/14/2011 1215   BILIRUBINUR NEGATIVE 09/14/2011 1215   KETONESUR NEGATIVE 09/14/2011 1215   PROTEINUR NEGATIVE 09/14/2011 1215   UROBILINOGEN 0.2 09/14/2011 1215   NITRITE NEGATIVE 09/14/2011 1215   LEUKOCYTESUR NEGATIVE 09/14/2011 1215    Radiological Exams on Admission: DG Chest Port 1 View Result Date: 04/05/2024 CLINICAL DATA:  [name]. EXAM: PORTABLE CHEST 1 VIEW COMPARISON:  01/10/2019. FINDINGS: Stable cardiomegaly and left ventricular prominence. Left-sided pacemaker/AICD in place. Mild left basilar opacity could reflect overlying soft tissue, atelectasis, or infiltrate. The right lung is clear. No sizable pleural effusion or pneumothorax. Surgical clips overlying the right breast. No acute osseous abnormality. IMPRESSION: Stable cardiomegaly. Mild left basilar opacity  could reflect overlying soft tissue, atelectasis, or infiltrate. Electronically Signed   By: Mannie Seek M.D.   On: 04/05/2024 15:52    EKG: Independently reviewed.  Interpretation limited secondary to paced rhythm.  Assessment and Plan  Near syncope Not hypoglycemic.  Troponin mildly elevated but stable, not consistent with ACS.  Patient is not endorsing chest pain.  Chest x-ray showing mild left basilar opacity which could reflect overlying soft tissue, atelectasis, or infiltrate.   Pneumonia less likely given no fever or leukocytosis.  Will check procalcitonin level. PE less likely given no tachycardia or hypoxia.  Patient's pacemaker was interrogated and revealed no arrhythmia at the time of the event, however, she has a high burden of PVCs.  Electrolytes normal.  ED physician had discussed the case with on-call cardiologist who recommended admission for observation overnight.  Continue cardiac monitoring. Last echo done in November 2023 showing EF 20 to 25%, grade 1 diastolic dysfunction, trivial mitral regurgitation, and no aortic stenosis.  Repeat echocardiogram ordered.  Systolic blood pressure currently in the 90s.  Hold home antihypertensives/diuretics at this time and check orthostatics.  Paroxysmal atrial fibrillation Continue Eliquis .  Holding Coreg  at this time due to mild hypotension.  Chronic HFrEF status post AICD No signs of volume overload.  Holding home medications at this time due to mild hypotension.  Check BNP.  Hypertension Holding home medications at this time due to mild hypotension.  Hyperlipidemia Continue Crestor .  Hypothyroidism TSH normal.  Continue Synthroid.  CKD stage IIIa Creatinine 1.4 (baseline around 1.2).  Hold home diuretics at this time and monitor renal function.  GERD Continue PPI.  Gout Continue allopurinol .  DVT prophylaxis: Eliquis  Code Status: Full Code (discussed with the patient)  Level of care: Progressive Care Unit Admission status: It is my clinical opinion that referral for OBSERVATION is reasonable and necessary in this patient based on the above information provided. The aforementioned taken together are felt to place the patient at high risk for further clinical deterioration. However, it is anticipated that the patient may be medically stable for discharge from the hospital within 24 to 48 hours.  Juliette Oh MD [facility]  If 7PM-7AM, please contact night-coverage [email or web address]  04/06/2024,  2:40 AM

## 2024-04-06 NOTE — ED Notes (Signed)
 Pt to echo via stretcher. RR even and unlabored.

## 2024-04-06 NOTE — Care Management Obs Status (Signed)
 MEDICARE OBSERVATION STATUS NOTIFICATION   Patient Details  Name: [name] MRN: [id number] Date of Birth: 1951-07-23   Medicare Observation Status Notification Given:  Yes    Ronni Colace, RN 04/06/2024, 12:33 PM

## 2024-04-06 NOTE — Care Management Obs Status (Signed)
 MEDICARE OBSERVATION STATUS NOTIFICATION   Patient Details  Name: [name] MRN: [id number] Date of Birth: 08/06/1951   Medicare Observation Status Notification Given:  Yes    Ronni Colace, RN 04/06/2024, 12:31 PM

## 2024-04-06 NOTE — Care Management (Addendum)
 Transition of Care [facility]) - Inpatient Brief Assessment   Patient Details  Name: [name] MRN: [id number] Date of Birth: Jan 25, 1951  Transition of [facility] CM/SW Contact:    Ronni Colace, RN Phone Number: 04/06/2024, 12:55 PM   Clinical Narrative:  Patient presented with atrial fibrillation. Met with patient and her husband at bedside, introduced self and explained role. They currently have a rolator, regular walker, shower stool and a wheelchair. 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 They are interested in home health, Mrs Howlett states she really needs some PT. They would like a company/agency that works best with their insurance. Called Amy with [facility] to  Accept. Will need HH orders( face to face) [facility] ( amy) accepted for HH TOC to follow Transition of Care Asessment: Insurance and Status: Insurance coverage has been reviewed Patient has primary care physician: Yes Home environment has been reviewed: Lives with husband Prior level of function:: Management consultant Home Services: No current home services Social Drivers of Health Review: SDOH reviewed no interventions necessary Readmission risk has been reviewed: Yes Transition of care needs: transition of care needs identified, TOC will continue to follow

## 2024-04-06 NOTE — ED Notes (Signed)
Lab at bedside to draw potassium.

## 2024-04-06 NOTE — Progress Notes (Signed)
 Echocardiogram 2D Echocardiogram has been performed.  Daphne Karrer N Damir Leung,RDCS 04/06/2024, 11:17 AM

## 2024-04-06 NOTE — Discharge Summary (Signed)
 Physician Discharge Summary  [name] ZOX:[id number] DOB: [birth year] DOA: 04/05/2024  PCP: Abbe Hoard., MD  Admit date: 04/05/2024 Discharge date: 04/06/2024  Admitted From: home Disposition:  home  Recommendations for Outpatient Follow-up:  Follow up with PCP in 1-2 weeks  Home Health: none Equipment/Devices: none  Discharge Condition: stable CODE STATUS: Full code Diet Orders (From admission, onward)     Start     Ordered   04/06/24 0336  Diet Heart Room service appropriate? Yes; Fluid consistency: Thin  Diet effective now       Question Answer Comment  Room service appropriate? Yes   Fluid consistency: Thin      04/06/24 0336            HPI: Per admitting MD,  [name] is a 73 y.o. 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  Blood pressure soft with systolic in the 90s.  Not tachycardic or hypoxic.  Labs showing no leukocytosis or anemia, platelet count mildly low and stable, potassium and magnesium  within normal range, no hypoglycemia, creatinine 1.4 (baseline around 1.2), troponin 61> 57, TSH normal.  Chest x-ray showing mild left basilar opacity which could reflect overlying soft tissue, atelectasis, or infiltrate.  Pacemaker interrogation revealed no arrhythmia at the time of the event, however, she has a high burden of PVCs.  ED physician discussed the case with on-call cardiologist who recommended admission for observation overnight.  [facility] called to admit.   Hospital Course / Discharge diagnoses: Principal Problem:   Near syncope Active Problems:   Essential hypertension    Chronic systolic heart failure (HCC)   A-fib (HCC)   Hypothyroidism  Principal problem Near syncope -patient admitted to the hospital with near syncopal episode while at the ophthalmologist.  She reports 3 other episodes at home that happened more so when she was up and moving around.  She also tells me that she has lost about 30 pounds, intentional, in the last few months.  She checks her blood pressure usually in the morning before taking her blood pressure medications and generally sees around 100 systolic.  During her stay here, she has been intermittently hypotensive with some readings into the 70s and 80s.  She was also mildly orthostatic, asymptomatic.  A 2D echo gram revealed again a low EF of 20%.  It seems to me that with her recent weight loss she may be having more orthostasis and soft blood pressures causing her presyncopal episodes.  I discussed case with Dr. Conni Deis with cardiology, and in this case he would favor to discontinue the furosemide  first, keeping the Entresto  as well as a lower dose Coreg .  Will continue spironolactone .  Patient seems stable now, back to baseline, and will be discharged home in stable condition.  She has outpatient follow-up in A-fib clinic in 2 days and recommend to have her orthostatic vital signs rechecked  Active problems PAF-continue Eliquis , continue Coreg  at lower dose Chronic systolic CHF-has an AICD in place.  No signs of volume overload.  Plan as above Essential hypertension-plan as above Hyperlipidemia-continue home medications Hypothyroidism-continue Synthroid CKD 3A-creatinine at baseline Obesity, morbid-BMI 47.  Recommend ongoing weight loss  Sepsis ruled out  Discharge Instructions   Allergies as of 04/06/2024       Reactions   Alphagan [brimonidine]    Irritation    Mobic [meloxicam] Other (See Comments)   Cannot take due to heart issue   Nsaids Other (See Comments)   Cannot take due to heart issue   Polyvinyl Alcohol Other  (See Comments)   Travatan and Alphagan(irritation)   Statins Other (See Comments)   Per pt her cardiologist told her not to take   Tolmetin Other (See Comments)   Can not take due to heart issue   Travatan Z [travoprost (bak Free)]    Irritation   Tape Rash   Uncoded Allergy. Allergen: Tape Blister from clear tapes.        Medication List     STOP taking these medications    furosemide  40 MG tablet Commonly known as: LASIX        TAKE these medications    allopurinol  300 MG tablet Commonly known as: ZYLOPRIM  Take 300 mg by mouth daily.   apixaban  5 MG Tabs tablet Commonly known as: ELIQUIS  Take 1 tablet (5 mg total) by mouth 2 (two) times daily.   bimatoprost  0.03 % ophthalmic solution Commonly known as: LUMIGAN  Place 1 drop into both eyes at bedtime. Reported on 01/12/2016   Calcium -Vitamin D3 600-200 MG-UNIT Tabs Take 1 tablet by mouth 2 (two) times daily.   carvedilol  6.25 MG tablet Commonly known as: COREG  Take 0.5 tablets (3.125 mg total) by mouth 2 (two) times daily. What changed: how much to take   cetirizine 10 MG tablet Commonly known as: ZYRTEC Take 10 mg by mouth at bedtime.   clotrimazole-betamethasone cream Commonly known as: LOTRISONE Apply 1 Application topically 2 (two) times daily as needed (Rash).   Colcrys  0.6 MG tablet Generic drug: colchicine  TAKE ONE TABLET BY MOUTH EVERY DAY   cyanocobalamin 1000 MCG tablet Commonly known as: VITAMIN B12 Take 1,000 mcg by mouth daily.   cyclobenzaprine  10 MG tablet Commonly known as: Flexeril  Take 1 tablet (10 mg total) by mouth 3 (three) times daily as needed for muscle spasms.   Delsym 30 MG/5ML liquid Generic drug: dextromethorphan Take 5 mLs by mouth daily as needed for cough.   Denta 5000 Plus 1.1 % Crea dental cream Generic drug: sodium fluoride Place 1 Application onto teeth 2 (two) times daily.   diclofenac  sodium 1 % Gel Commonly known as: VOLTAREN  APPLY THREE GRAMS TO THREE  LARGE JOINTS UP TO THREE TIMES DAILY AS NEEDED   docusate sodium  100 MG capsule Commonly known as: COLACE Take 100 mg by mouth 2 (two) times daily as needed for mild constipation.   Entresto  24-26 MG Generic drug: sacubitril -valsartan  TAKE ONE TABLET BY MOUTH TWICE DAILY   famotidine  20 MG tablet Commonly known as: PEPCID  Take 20 mg by mouth 2 (two) times daily.   ipratropium 0.03 % nasal spray Commonly known as: ATROVENT Place 2 sprays into both nostrils daily as needed for rhinitis.   ivabradine  5 MG Tabs tablet Commonly known as: [name] Take 0.5 tablets (2.5 mg total) by mouth 2 (two) times daily. If hr greater than 70 What changed:  when to take this additional instructions   levothyroxine 25 MCG tablet Commonly known as: SYNTHROID Take 25 mcg by mouth daily.   montelukast  10 MG tablet Commonly known as: SINGULAIR  Take 10 mg by mouth at bedtime. For allergies & congestion   OCUVITE PO Take 5 mg by mouth daily.   omeprazole  40 MG  capsule Commonly known as: PRILOSEC TAKE ONE CAPSULE BY MOUTH TWICE DAILY   PARoxetine  30 MG tablet Commonly known as: PAXIL  Take 30 mg by mouth at bedtime.   rosuvastatin  5 MG tablet Commonly known as: CRESTOR  TAKE ONE TABLET BY MOUTH EVERY EVENING   sodium chloride  0.65 % Soln nasal spray Commonly known as: OCEAN Place 1 spray into both nostrils daily as needed for congestion.   spironolactone  25 MG tablet Commonly known as: ALDACTONE  TAKE ONE TABLET BY MOUTH daily   SYSTANE OP Place 1 drop into both eyes daily as needed (dry eyes).   traMADol  50 MG tablet Commonly known as: ULTRAM  Take 50 mg by mouth every 6 (six) hours as needed for pain.   Vitamin D 125 MCG (5000 UT) Caps Take 5,000 Units by mouth daily.   vitamin E 1000 UNIT capsule Take 1,000 Units by mouth daily.        Follow-up Information     [facility] Follow up.   Why: They will call you to set up services of PT and OT                 Consultations: none  Procedures/Studies:  ECHOCARDIOGRAM COMPLETE Result Date: 04/06/2024    ECHOCARDIOGRAM REPORT   Patient Name:   [name] Date of Exam: 04/06/2024 Medical Rec #:  [id number]      Height:       62.0 in Accession #:    [id number]     Weight:       261.0 lb Date of Birth:  20-Feb-1951      BSA:          2.141 m Patient Age:    73 years       BP:           126/82 mmHg Patient Gender: F              HR:           80 bpm. Exam Location:  Inpatient Procedure: 2D Echo, Cardiac Doppler, Color Doppler and Intracardiac            Opacification Agent (Both Spectral and Color Flow Doppler were            utilized during procedure). Indications:    Near Syncope  History:        Patient has prior history of Echocardiogram examinations, most                 recent 09/01/2022. CHF and Nonischemic Cardiomyopathy, Aortic                 Dissection and DVT, Arrythmias:Atrial Fibrillation,                 Signs/Symptoms:[name]; Risk Factors:Dyslipidemia                 and Hypertension.  Sonographer:    Travis Friedman RDCS Referring Phys: [id number] Juliette Oh  Sonographer Comments: Patient is obese. IMPRESSIONS  1. No mural apical thrombus with definity . Left ventricular ejection fraction, by estimation, is <20%. The left ventricle has severely decreased function. The left ventricle demonstrates global hypokinesis. The left ventricular internal cavity size was severely dilated. Left ventricular diastolic parameters are indeterminate.  2. Device lead in RV. Right ventricular systolic function is normal. The right ventricular size is normal.  3. Left atrial size was moderately dilated.  4. Functional MR from LV dysfunction and reduced posterior leaflet motion apically  displaced coaptation point. The mitral valve is abnormal. Mild to moderate mitral valve regurgitation. No evidence of mitral stenosis.  5. Mean gradient 4 peak 9 mmHg likely mild low flow AS . The aortic valve is tricuspid. There  is mild calcification of the aortic valve. There is mild thickening of the aortic valve. Aortic valve regurgitation is not visualized. Aortic valve sclerosis is present, with no evidence of aortic valve stenosis.  6. The inferior vena cava is normal in size with greater than 50% respiratory variability, suggesting right atrial pressure of 3 mmHg. FINDINGS  Left Ventricle: No mural apical thrombus with definity . Left ventricular ejection fraction, by estimation, is <20%. The left ventricle has severely decreased function. The left ventricle demonstrates global hypokinesis. Strain was performed and the global longitudinal strain is indeterminate. The left ventricular internal cavity size was severely dilated. There is no left ventricular hypertrophy. Left ventricular diastolic parameters are indeterminate. Right Ventricle: Device lead in RV. The right ventricular size is normal. No increase in right ventricular wall thickness. Right ventricular systolic function is normal. Left Atrium: Left atrial size was moderately dilated. Right Atrium: Right atrial size was normal in size. Pericardium: There is no evidence of pericardial effusion. Mitral Valve: Functional MR from LV dysfunction and reduced posterior leaflet motion apically displaced coaptation point. The mitral valve is abnormal. There is mild thickening of the mitral valve leaflet(s). There is mild calcification of the mitral valve leaflet(s). Mild to moderate mitral valve regurgitation. No evidence of mitral valve stenosis. Tricuspid Valve: The tricuspid valve is normal in structure. Tricuspid valve regurgitation is mild . No evidence of tricuspid stenosis. Aortic Valve: Mean gradient 4 peak 9 mmHg likely mild low flow AS. The aortic valve is tricuspid. There is mild calcification of the aortic valve. There is mild thickening of the aortic valve. Aortic valve regurgitation is not visualized. Aortic valve sclerosis is present, with no evidence of aortic valve  stenosis. Aortic valve mean gradient measures 4.0 mmHg. Aortic valve peak gradient measures 8.5 mmHg. Aortic valve area, by VTI measures 1.14 cm. Pulmonic Valve: The pulmonic valve was normal in structure. Pulmonic valve regurgitation is not visualized. No evidence of pulmonic stenosis. Aorta: The aortic root is normal in size and structure. Venous: The inferior vena cava is normal in size with greater than 50% respiratory variability, suggesting right atrial pressure of 3 mmHg. IAS/Shunts: No atrial level shunt detected by color flow Doppler. Additional Comments: 3D was performed not requiring image post processing on an independent workstation and was indeterminate.  LEFT VENTRICLE PLAX 2D LVIDd:         7.30 cm LVIDs:         6.75 cm LV PW:         0.90 cm LV IVS:        1.05 cm LVOT diam:     2.40 cm LV SV:         36 LV SV Index:   17 LVOT Area:     4.52 cm  LV Volumes (MOD) LV vol d, MOD A2C: 515.0 ml LV vol d, MOD A4C: 519.0 ml LV vol s, MOD A2C: 430.0 ml LV vol s, MOD A4C: 430.0 ml LV SV MOD A2C:     85.0 ml LV SV MOD A4C:     519.0 ml LV SV MOD BP:      82.1 ml RIGHT VENTRICLE          IVC RV Basal diam:  3.50 cm  IVC diam:  2.10 cm TAPSE (M-mode): 1.1 cm LEFT ATRIUM              Index        RIGHT ATRIUM           Index LA diam:        4.30 cm  2.01 cm/m   RA Area:     10.80 cm LA Vol (A2C):   85.9 ml  40.13 ml/m  RA Volume:   20.50 ml  9.58 ml/m LA Vol (A4C):   121.0 ml 56.52 ml/m LA Biplane Vol: 102.0 ml 47.65 ml/m  AORTIC VALVE AV Area (Vmax):    1.50 cm AV Area (Vmean):   1.50 cm AV Area (VTI):     1.14 cm AV Vmax:           146.00 cm/s AV Vmean:          96.700 cm/s AV VTI:            0.315 m AV Peak Grad:      8.5 mmHg AV Mean Grad:      4.0 mmHg LVOT Vmax:         48.50 cm/s LVOT Vmean:        32.000 cm/s LVOT VTI:          0.080 m LVOT/AV VTI ratio: 0.25  AORTA Ao Root diam: 3.50 cm Ao Asc diam:  2.90 cm  SHUNTS Systemic VTI:  0.08 m Systemic Diam: 2.40 cm Janelle Mediate MD Electronically  signed by Janelle Mediate MD Signature Date/Time: 04/06/2024/12:27:47 PM    Final    DG Chest Port 1 View Result Date: 04/05/2024 CLINICAL DATA:  [name]. EXAM: PORTABLE CHEST 1 VIEW COMPARISON:  01/10/2019. FINDINGS: Stable cardiomegaly and left ventricular prominence. Left-sided pacemaker/AICD in place. Mild left basilar opacity could reflect overlying soft tissue, atelectasis, or infiltrate. The right lung is clear. No sizable pleural effusion or pneumothorax. Surgical clips overlying the right breast. No acute osseous abnormality. IMPRESSION: Stable cardiomegaly. Mild left basilar opacity could reflect overlying soft tissue, atelectasis, or infiltrate. Electronically Signed   By: Mannie Seek M.D.   On: 04/05/2024 15:52     Subjective: - no chest pain, shortness of breath, no abdominal pain, nausea or vomiting.   Discharge Exam: BP 98/72   Pulse 84   Temp 97.9 F (36.6 C) (Oral)   Resp 20   Ht 5\' 2"  (1.575 m)   Wt 118.4 kg   SpO2 100%   BMI 47.74 kg/m   General: Pt is alert, awake, not in acute distress Cardiovascular: RRR, S1/S2 +, no rubs, no gallops Respiratory: CTA bilaterally, no wheezing, no rhonchi Abdominal: Soft, NT, ND, bowel sounds + Extremities: no edema, no cyanosis    The results of significant diagnostics from this hospitalization (including imaging, microbiology, ancillary and laboratory) are listed below for reference.     Microbiology: No results found for this or any previous visit (from the past 240 hours).   Labs: Basic Metabolic Panel: Recent Labs  Lab 04/05/24 1631 04/06/24 0455  NA 141 135  K 3.8 2.9*  CL 100 102  CO2 27 24  GLUCOSE 116* 89  BUN 14 14  CREATININE 1.45* 1.09*  CALCIUM  9.2 7.3*  MG 1.8  --    Liver Function Tests: Recent Labs  Lab 04/05/24 1631  AST 24  ALT 11  ALKPHOS 79  BILITOT 0.8  PROT 7.1  ALBUMIN 3.2*   CBC: Recent Labs  Lab 04/05/24  1631  WBC 7.7  NEUTROABS 5.4  HGB 14.2  HCT 43.1  MCV  94.1  PLT 132*   CBG: No results for input(s): "GLUCAP" in the last 168 hours. Hgb A1c No results for input(s): "HGBA1C" in the last 72 hours. Lipid Profile No results for input(s): "CHOL", "HDL", "LDLCALC", "TRIG", "CHOLHDL", "LDLDIRECT" in the last 72 hours. Thyroid  function studies Recent Labs    04/05/24 2010  TSH 1.968   Urinalysis    Component Value Date/Time   COLORURINE YELLOW 09/14/2011 1215   APPEARANCEUR CLEAR 09/14/2011 1215   LABSPEC 1.013 09/14/2011 1215   PHURINE 5.0 09/14/2011 1215   GLUCOSEU NEGATIVE 09/14/2011 1215   HGBUR NEGATIVE 09/14/2011 1215   BILIRUBINUR NEGATIVE 09/14/2011 1215   KETONESUR NEGATIVE 09/14/2011 1215   PROTEINUR NEGATIVE 09/14/2011 1215   UROBILINOGEN 0.2 09/14/2011 1215   NITRITE NEGATIVE 09/14/2011 1215   LEUKOCYTESUR NEGATIVE 09/14/2011 1215    FURTHER DISCHARGE INSTRUCTIONS:   Get Medicines reviewed and adjusted: Please take all your medications with you for your next visit with your Primary MD   Laboratory/radiological data: Please request your Primary MD to go over all hospital tests and procedure/radiological results at the follow up, please ask your Primary MD to get all Hospital records sent to his/her office.   In some cases, they will be blood work, cultures and biopsy results pending at the time of your discharge. Please request that your primary care M.D. goes through all the records of your hospital data and follows up on these results.   Also Note the following: If you experience worsening of your admission symptoms, develop shortness of breath, life threatening emergency, suicidal or homicidal thoughts you must seek medical attention immediately by calling 911 or calling your MD immediately  if symptoms less severe.   You must read complete instructions/literature along with all the possible adverse reactions/side effects for all the Medicines you take and that have been prescribed to you. Take any new Medicines  after you have completely understood and accpet all the possible adverse reactions/side effects.    Do not drive when taking Pain medications or sleeping medications (Benzodaizepines)   Do not take more than prescribed Pain, Sleep and Anxiety Medications. It is not advisable to combine anxiety,sleep and pain medications without talking with your primary care practitioner   Special Instructions: If you have smoked or chewed Tobacco  in the last 2 yrs please stop smoking, stop any regular Alcohol  and or any Recreational drug use.   Wear Seat belts while driving.   Please note: You were cared for by a hospitalist during your hospital stay. Once you are discharged, your primary care physician will handle any further medical issues. Please note that NO REFILLS for any discharge medications will be authorized once you are discharged, as it is imperative that you return to your primary care physician (or establish a relationship with a primary care physician if you do not have one) for your post hospital discharge needs so that they can reassess your need for medications and monitor your lab values.  Time coordinating discharge: 45 minutes  SIGNED:  Kathlen Para, MD, PhD 04/06/2024, 3:52 PM

## 2024-04-07 DIAGNOSIS — I951 Orthostatic hypotension: Secondary | ICD-10-CM | POA: Diagnosis not present

## 2024-04-07 DIAGNOSIS — I480 Paroxysmal atrial fibrillation: Secondary | ICD-10-CM

## 2024-04-07 DIAGNOSIS — I340 Nonrheumatic mitral (valve) insufficiency: Secondary | ICD-10-CM

## 2024-04-07 DIAGNOSIS — I5022 Chronic systolic (congestive) heart failure: Secondary | ICD-10-CM

## 2024-04-07 DIAGNOSIS — I350 Nonrheumatic aortic (valve) stenosis: Secondary | ICD-10-CM

## 2024-04-07 DIAGNOSIS — R55 Syncope and collapse: Secondary | ICD-10-CM | POA: Diagnosis not present

## 2024-04-07 MED ORDER — SPIRONOLACTONE 25 MG PO TABS
25.0000 mg | ORAL_TABLET | Freq: Every day | ORAL | Status: DC
  Administered 2024-04-08: 25 mg via ORAL
  Filled 2024-04-07: qty 1

## 2024-04-07 MED ORDER — SPIRONOLACTONE 12.5 MG HALF TABLET: 12.5000 mg | ORAL_TABLET | Freq: Every day | ORAL | Status: DC

## 2024-04-07 NOTE — Evaluation (Signed)
 Physical Therapy Evaluation Patient Details Name: [name] MRN: [id number] DOB: 1951-01-03 Today's Date: 04/07/2024  History of Present Illness  73 y.o. female presented to the ED 04/06/24 after near syncopal event. PMH significant of paroxysmal A-fib on Eliquis , chronic HFrEF, status post pacemaker and defibrillator, hypertension, hyperlipidemia, hypothyroidism,  LBBB, CKD stage IIIa, breast cancer, type B aortic dissection in 2010, DVT, hiatal hernia, GERD, glaucoma, depression, gout  Clinical Impression   Pt admitted secondary to problem above with deficits below. PTA patient lives with husband in one level home with ramp to enter. She uses rollator at all times, including having to sit on seat and rest at times. Pt currently is limited by orthostasis (see vitals flowsheet) and feeling "weak." Reports on previous occasions "things have started to go dark," but not today. Was able to progress to ambulation x 45 ft with rollator and supervision. Anticipate patient will benefit from PT to address problems listed below. Will continue to follow acutely to maximize functional mobility, independence, and safety. Patient can benefit from HHPT for monitored/supervised exercise and gait training.          If plan is discharge home, recommend the following: A little help with walking and/or transfers;A lot of help with bathing/dressing/bathroom;Assistance with cooking/housework;Assist for transportation;Help with stairs or ramp for entrance   Can travel by private vehicle        Equipment Recommendations None recommended by PT  Recommendations for Other Services       Functional Status Assessment Patient has had a recent decline in their functional status and demonstrates the ability to make significant improvements in function in a reasonable and predictable amount of time.     Precautions / Restrictions Precautions Precautions: Fall Recall of Precautions/Restrictions: Intact       Mobility  Bed Mobility Overal bed mobility: Needs Assistance Bed Mobility: Supine to Sit, Sit to Supine     Supine to sit: Min assist, HOB elevated, Used rails Sit to supine: Min assist   General bed mobility comments: assist to raise torso (light min) and to raise legs    Transfers Overall transfer level: Needs assistance Equipment used: Rollator (4 wheels) Transfers: Sit to/from Stand Sit to Stand: Supervision           General transfer comment: vc for locking brakes prior to sitting    Ambulation/Gait Ambulation/Gait assistance: Supervision Gait Distance (Feet): 45 Feet Assistive device: Rollator (4 wheels) Gait Pattern/deviations: Step-through pattern, Decreased stride length, Wide base of support   Gait velocity interpretation: 1.31 - 2.62 ft/sec, indicative of limited community ambulator   General Gait Details: mild dyspnea  Stairs            Wheelchair Mobility     Tilt Bed    Modified Rankin (Stroke Patients Only)       Balance Overall balance assessment: Mild deficits observed, not formally tested                                           Pertinent Vitals/Pain Pain Assessment Pain Assessment: No/denies pain    Home Living Family/patient expects to be discharged to:: Private residence Living Arrangements: Spouse/significant other Available Help at Discharge: Family;Available 24 hours/day Type of Home: House Home Access: Ramped entrance       Home Layout: One level Home Equipment: Grab bars - toilet;Rolling Walker (2 wheels);Rollator (4 wheels);Shower seat;BSC/3in1;Grab bars -  tub/shower;Hand held shower head;Wheelchair - manual      Prior Function Prior Level of Function : Needs assist;Driving;History of Falls (last six months)             Mobility Comments: walks with rollator; sitting at dining room table and chair leg broke, hurt tailbone; sits on donut ADLs Comments: mostly independent with dressing  after setup (sometimes having bad day and needs help with pants)     Extremity/Trunk Assessment   Upper Extremity Assessment Upper Extremity Assessment: Generalized weakness    Lower Extremity Assessment Lower Extremity Assessment: Generalized weakness (bil LE edema)    Cervical / Trunk Assessment Cervical / Trunk Assessment: Other exceptions Cervical / Trunk Exceptions: overweight  Communication   Communication Communication: No apparent difficulties    Cognition Arousal: Alert Behavior During Therapy: WFL for tasks assessed/performed   PT - Cognitive impairments: No apparent impairments                         Following commands: Intact       Cueing Cueing Techniques: Verbal cues     General Comments General comments (skin integrity, edema, etc.): see vitals flowsheet for orthostatic BPs (+)    Exercises     Assessment/Plan    PT Assessment Patient needs continued PT services  PT Problem List Decreased activity tolerance;Decreased balance;Decreased mobility;Decreased knowledge of use of DME;Cardiopulmonary status limiting activity;Obesity       PT Treatment Interventions DME instruction;Gait training;Functional mobility training;Therapeutic activities;Therapeutic exercise;Balance training;Patient/family education    PT Goals (Current goals can be found in the Care Plan section)  Acute Rehab PT Goals Patient Stated Goal: feel better PT Goal Formulation: With patient Time For Goal Achievement: 04/21/24 Potential to Achieve Goals: Good    Frequency Min 2X/week     Co-evaluation               AM-PAC PT "6 Clicks" Mobility  Outcome Measure Help needed turning from your back to your side while in a flat bed without using bedrails?: A Little Help needed moving from lying on your back to sitting on the side of a flat bed without using bedrails?: A Little Help needed moving to and from a bed to a chair (including a wheelchair)?: A Little Help  needed standing up from a chair using your arms (e.g., wheelchair or bedside chair)?: A Little Help needed to walk in hospital room?: A Little Help needed climbing 3-5 steps with a railing? : Total 6 Click Score: 16    End of Session   Activity Tolerance: Patient limited by fatigue;Treatment limited secondary to medical complications (Comment) (feels "weak"; denies lightheadedness) Patient left: in bed;with call bell/phone within reach Nurse Communication: Mobility status;Other (comment) (+orthostatics) PT Visit Diagnosis: Difficulty in walking, not elsewhere classified (R26.2)    Time: 1610-9604 PT Time Calculation (min) (ACUTE ONLY): 43 min   Charges:   PT Evaluation $PT Eval Low Complexity: 1 Low PT Treatments $Gait Training: 8-22 mins $Therapeutic Activity: 8-22 mins PT General Charges $$ ACUTE PT VISIT: 1 Visit          Gayle Kava, PT Acute Rehabilitation Services  Office [phone]   Guilford Leep 04/07/2024, 8:36 AM

## 2024-04-07 NOTE — Plan of Care (Signed)

## 2024-04-07 NOTE — Progress Notes (Signed)
 PROGRESS NOTE  [name] ZOX:[id number] DOB: 1950/11/05 DOA: 04/05/2024 PCP: Abbe Hoard., MD   LOS: 0 days   Brief Narrative / Interim history: 73 year old female with advanced heart failure, seen in the CHF clinic here with few presyncopal episodes, about 4 in the last 2 weeks, most recent 1 the day prior to admission. She has lost, intentionally, about 30 pounds and has noted that her blood pressure has been on the low side.  Cardiology consulted   Subjective / 24h Interval events: She is doing well today, just worked a little bit with therapy on my evaluation and is orthostatic  Assesement and Plan: Principal Problem:   Near syncope Active Problems:   Essential hypertension   Chronic systolic heart failure (HCC)   A-fib (HCC)   Hypothyroidism  Principal problem Near syncope-patient with recent 30 pound weight loss, intentional, and has been noticing that her blood pressure has been on the soft slightly high at home.  Cardiology consulted and evaluated patient, patient for now recommending to hold furosemide  and continue other home medications with holding parameters. - Still intermittently hypotensive with a systolic in the 80s this morning, could not get her medications due to that.  Cardiology recommends ongoing monitoring  Active problems Chronic systolic CHF, AICD in place -most recent 2D echo done on admission shows EF less than 20%.  Continue goal-directed medical therapy as tolerated.  Has a degree of lower extremity swelling but she feels at baseline without additional fluids.  Essential hypertension-medications as tolerated  Hypothyroidism-continue Synthroid  CKD 3A-creatinine at baseline  PAF-continue Eliquis  and Coreg  with holding parameters  Scheduled Meds:  allopurinol   300 mg Oral Daily   apixaban   5 mg Oral BID   carvedilol   3.125 mg Oral BID WC   latanoprost  1 drop Both Eyes QHS   levothyroxine  25 mcg Oral Daily   pantoprazole   40 mg Oral Daily    rosuvastatin   5 mg Oral QPM   sacubitril -valsartan   1 tablet Oral BID   [START ON 04/08/2024] spironolactone   25 mg Oral Daily   Continuous Infusions: PRN Meds:.acetaminophen  **OR** acetaminophen   Current Outpatient Medications  Medication Instructions   allopurinol  (ZYLOPRIM ) 300 mg, Oral, Daily   apixaban  (ELIQUIS ) 5 mg, Oral, 2 times daily   bimatoprost  (LUMIGAN ) 0.03 % ophthalmic solution 1 drop, Both Eyes, Daily at bedtime, Reported on 01/12/2016   Calcium  Carbonate-Vitamin D (CALCIUM -VITAMIN D3) 600-200 MG-UNIT TABS 1 tablet, Oral, 2 times daily,     carvedilol  (COREG ) 3.125 mg, Oral, 2 times daily   cetirizine (ZYRTEC) 10 mg, Oral, Daily at bedtime   clotrimazole-betamethasone (LOTRISONE) cream 1 Application, Topical, 2 times daily PRN   COLCRYS  0.6 MG tablet TAKE ONE TABLET BY MOUTH EVERY DAY   cyanocobalamin (VITAMIN B12) 1,000 mcg, Oral, Daily   cyclobenzaprine  (FLEXERIL ) 10 mg, Oral, 3 times daily PRN   dextromethorphan (DELSYM) 30 MG/5ML liquid 5 mLs, Oral, Daily PRN   diclofenac  sodium (VOLTAREN ) 1 % GEL APPLY THREE GRAMS TO THREE LARGE JOINTS UP TO THREE TIMES DAILY AS NEEDED   docusate sodium  (COLACE) 100 mg, Oral, 2 times daily PRN   ENTRESTO  24-26 MG 1 tablet, Oral, 2 times daily   famotidine  (PEPCID ) 20 mg, Oral, 2 times daily   ipratropium (ATROVENT) 0.03 % nasal spray 2 sprays, Each Nare, Daily PRN   ivabradine  ([name]) 2.5 mg, Oral, 2 times daily, If hr greater than 70   levothyroxine (SYNTHROID) 25 mcg, Oral, Daily   montelukast  (SINGULAIR ) 10  mg, Oral, Daily at bedtime, For allergies & congestion   Multiple Vitamins-Minerals (OCUVITE PO) 5 mg, Oral, Daily   omeprazole  (PRILOSEC) 40 MG capsule TAKE ONE CAPSULE BY MOUTH TWICE DAILY   PARoxetine  (PAXIL ) 30 mg, Oral, Daily at bedtime   Polyethyl Glycol-Propyl Glycol (SYSTANE OP) 1 drop, Both Eyes, Daily PRN   rosuvastatin  (CRESTOR ) 5 mg, Oral, Every evening   sodium chloride  (OCEAN) 0.65 % SOLN nasal spray 1  spray, Each Nare, Daily PRN   sodium fluoride (DENTA 5000 PLUS) 1.1 % CREA dental cream 1 Application, dental, 2 times daily   spironolactone  (ALDACTONE ) 25 mg, Oral, Daily   traMADol  (ULTRAM ) 50 mg, Oral, Every 6 hours PRN   Vitamin D 5,000 Units, Oral, Daily,     vitamin E 1,000 Units, Oral, Daily    Diet Orders (From admission, onward)     Start     Ordered   04/06/24 0336  Diet Heart Room service appropriate? Yes; Fluid consistency: Thin  Diet effective now       Question Answer Comment  Room service appropriate? Yes   Fluid consistency: Thin      04/06/24 0336            DVT prophylaxis:  apixaban  (ELIQUIS ) tablet 5 mg   Lab Results  Component Value Date   PLT 132 (L) 04/05/2024      Code Status: Full Code  Family Communication: no family at bedside   Status is: Observation The patient will require care spanning > 2 midnights and should be moved to inpatient because: ongoing monitoring   Level of care: Progressive  Consultants:  Cardiology   Objective: Vitals:   04/07/24 0931 04/07/24 1055 04/07/24 1114 04/07/24 1348  BP: 98/65 (!) 80/46 (!) 101/53 107/78  Pulse: 83 83  87  Resp: 20 18 16 17   Temp:    98.1 F (36.7 C)  TempSrc:    Oral  SpO2: 95%   94%  Weight:      Height:       No intake or output data in the 24 hours ending 04/07/24 1525 Wt Readings from Last 3 Encounters:  04/06/24 119.5 kg  04/05/24 118.8 kg  02/09/24 118.4 kg    Examination:  Constitutional: NAD Eyes: no scleral icterus ENMT: Mucous membranes are moist.  Neck: normal, supple Respiratory: clear to auscultation bilaterally, no wheezing, no crackles. Normal respiratory effort. No accessory muscle use.  Cardiovascular: Regular rate and rhythm, no murmurs / rubs / gallops. Trace LE edema.  Abdomen: non distended, no tenderness. Bowel sounds positive.  Musculoskeletal: no clubbing / cyanosis.    Data Reviewed: I have independently reviewed following labs and imaging  studies   CBC Recent Labs  Lab 04/05/24 1631  WBC 7.7  HGB 14.2  HCT 43.1  PLT 132*  MCV 94.1  MCH 31.0  MCHC 32.9  RDW 15.2  LYMPHSABS 1.6  MONOABS 0.5  EOSABS 0.1  BASOSABS 0.1    Recent Labs  Lab 04/05/24 1631 04/05/24 2010 04/06/24 0455 04/06/24 1849  NA 141  --  135  --   K 3.8  --  2.9* 4.4  CL 100  --  102  --   CO2 27  --  24  --   GLUCOSE 116*  --  89  --   BUN 14  --  14  --   CREATININE 1.45*  --  1.09*  --   CALCIUM  9.2  --  7.3*  --  AST 24  --   --   --   ALT 11  --   --   --   ALKPHOS 79  --   --   --   BILITOT 0.8  --   --   --   ALBUMIN 3.2*  --   --   --   MG 1.8  --   --   --   PROCALCITON  --   --  <0.10  --   TSH  --  1.968  --   --   BNP  --   --  341.5*  --     ------------------------------------------------------------------------------------------------------------------ No results for input(s): "CHOL", "HDL", "LDLCALC", "TRIG", "CHOLHDL", "LDLDIRECT" in the last 72 hours.  Lab Results  Component Value Date   HGBA1C 6.0 (H) 07/26/2023   ------------------------------------------------------------------------------------------------------------------ Recent Labs    04/05/24 2010  TSH 1.968    Cardiac Enzymes No results for input(s): "CKMB", "TROPONINI", "MYOGLOBIN" in the last 168 hours.  Invalid input(s): "CK" ------------------------------------------------------------------------------------------------------------------    Component Value Date/Time   BNP 341.5 (H) 04/06/2024 0455    CBG: No results for input(s): "GLUCAP" in the last 168 hours.  No results found for this or any previous visit (from the past 240 hours).   Radiology Studies: No results found.   Kathlen Para, MD, PhD [facility]  Between 7 am - 7 pm I am available, please contact me via Amion (for emergencies) or Securechat (non urgent messages)  Between 7 pm - 7 am I am not available, please contact night coverage MD/APP via  Amion

## 2024-04-07 NOTE — Consult Note (Addendum)
 Cardiology Consultation   Patient ID: [name] MRN: [id number]; DOB: 1951-08-04  Admit date: 04/05/2024 Date of Consult: 04/07/2024  PCP:  Abbe Hoard., MD   [facility] Providers Cardiologist:  None  Electrophysiologist:  Will Cortland Ding, MD  Advanced Heart Failure:  Jules Oar, MD    Patient Profile: [name] is a 73 y.o. female with a hx of hypertension, type B aortic dissection 2010 managed with medical therapy that has since healed, LBBB, chronic systolic heart failure/nonischemic cardiomyopathy s/p ICD, paroxysmal atrial fibrillation, history of breast cancer in 2009 who is being seen 04/07/2024 for the evaluation of near syncope at the request of Dr Aldona Amel.  History of Present Illness: [name] is a 73 year old female with above medical history who has been followed by Dr. Julane Ny with the heart failure clinic and follows with Dr. Lawana Pray with EP.  Per chart review, patient has a history of breast cancer in 2009.  Treated with lumpectomy, XRT, chemotherapy.  It is felt that her cardiomyopathy is due to chemotherapy.  Echocardiogram in 05/2011 showed EF 15-20% with diffuse hypokinesis, no LV apical thrombus 16.  She underwent Saint Jude BiV ICD implant in 07/2011 but unable to place LV lead.  She was eventually brought back in 09/2011 for left thoracotomy and placement of epicardial LV lead.  Over the years, EF has remained stable around 10-20%.   She had ICD generator changed out in 05/2023. Cardiac monitor in 12/2023 showed 3 episodes of SVT, longest lasting 2 min 46 sec. Device alert from 6/4 showed that patient had a brief episode of afib with RVR. She was symptomatic during this episode with dizziness. She was seen by the afib clinic on 6/6, started on eliquis  5 mg BID.   She presented to the ED on 6/6 after her afib clinic appointment. Reported having a near syncopal episode at her eye doctor appointment earlier that day. In the ED, BP was soft with  systolic in the 90s. EKG in the ED showed a-sensed v-paced rhythm with HR 85 BPM. Labs significant for creatinine 1.45, hsTn 61>57. CXR showed stable cardiomegaly, mild left basilar opacity. Her ICD was interrogated and revealed no arrhythmia at the time of the event, however she has a high burden of PVCs. In the ED, orthostatic vital signs were positive with BP drop from 103/70 lying to 72/59 sitting.   Patient was admitted to the internal medicine service. Her home GDMT was held for low BP. This included carvedilol  6.25 mg BID, entresto  24/26 mg BID, spironolactone  25 mg daily.    She was able to be restarted on carvedilol  3.125 mg BID, entrestor 24-26 mg BID, spironolactone  25 mg daily. Orthostatic vital signs 6/8 showed BP 107/73 lying, 98/83 sitting.   On interview, patient reports that she has had an intentional weight loss of 30 pounds in the past few months.  She has been losing weight by cutting back on snacking and watching her diet more closely.  She is also been having some episodes of nausea/vomiting over the past week or so.  In that time, she has been having episodes of dizziness/lightheadedness when standing.  Estimates having about 3 episodes of this when standing up from the commode, had 1 episode while in her eye doctor's office.  When these episodes occur, she usually starts to feel like her vision is tunneling, she gets lightheaded, and feel like she might pass out.  If she sits back down, the symptoms resolve.  Denies ever  losing consciousness.  She denies palpitations, chest pain, shortness of breath with these episodes. This AM, she was able to get out of bed and walk around the room without any dizziness, lightheadedness, near syncope     Past Medical History:  Diagnosis Date   Aortic dissection (HCC) 12/2008   Type B   Arthritis    lower back   Atrial fibrillation (HCC)    post op (left thoracotomy) 11/12 req amio/DCCV   Breast cancer (HCC) 07/2009   Stage 3 lumpectomy,  radiation, chemotherapy; finished chemo October, felt to be in remission   Cardiac defibrillator in place    St. Jude Carney) 9/12; LV lead placement unsuccessful;  s/p left thoracotomy with placement of epicardial LV lead 09/2011 (Dr. PVT)   CHF (congestive heart failure) (HCC)    Chronic systolic dysfunction of left ventricle    Depression    DVT (deep venous thrombosis) (HCC) 10/18/2010   GERD (gastroesophageal reflux disease)    Glaucoma    Hiatal hernia    small hiatal hernia   History of shingles    Hyperlipidemia    Hypertension    Left bundle branch block    Nonischemic cardiomyopathy (HCC)    Admission 12/11 with EF 25%, normal coronary arteries by cath 12/11; NICM presumed to be secondary to chemotherapy for breast cancer   Obesity    [name]    with exertion    Past Surgical History:  Procedure Laterality Date   BIV ICD GENERATOR CHANGEOUT N/A 05/22/2023   Procedure: BIV ICD GENERATOR CHANGEOUT;  Surgeon: Lei Pump, MD;  [facility] INV[facility]IVE CV LAB;  Service: Cardiovascular;  Laterality: N/A;   BREAST LUMPECTOMY     Right breast   CARDIAC CATHETERIZATION     2011   CARDIAC DEFIBRILLATOR PLACEMENT     EP IMPLANTABLE DEVICE N/A 01/12/2016   STJ ICD Unify Assura gen change, Dr. Nunzio Belch   OOPHORECTOMY     Single   THORACOTOMY  09/16/2011   Procedure: THORACOTOMY MAJOR;  Surgeon: Jerlyn Moons Trigt III, MD;  Location: [facility] OR;  Service: Thoracic;  Laterality: Left;  left anterolateral Thoracotomy for placement of St. Jude epicardial pacing lead    TONSILLECTOMY     TUBAL LIGATION     Bilateral     Scheduled Meds:  allopurinol   300 mg Oral Daily   apixaban   5 mg Oral BID   carvedilol   3.125 mg Oral BID WC   latanoprost  1 drop Both Eyes QHS   levothyroxine  25 mcg Oral Daily   pantoprazole   40 mg Oral Daily   rosuvastatin   5 mg Oral QPM   sacubitril -valsartan   1 tablet Oral BID   spironolactone   25 mg Oral Daily   Continuous Infusions:  PRN  Meds: acetaminophen  **OR** acetaminophen   Allergies:    Allergies  Allergen Reactions   Alphagan [Brimonidine]     Irritation    Mobic [Meloxicam] Other (See Comments)    Cannot take due to heart issue   Nsaids Other (See Comments)    Cannot take due to heart issue   Polyvinyl Alcohol Other (See Comments)    Travatan and Alphagan(irritation)   Statins Other (See Comments)    Per pt her cardiologist told her not to take   Tolmetin Other (See Comments)    Can not take due to heart issue   Travatan Z [Travoprost (Bak Free)]     Irritation   Tape Rash    Uncoded Allergy.  Allergen: Tape Blister from clear tapes.    Social History:   Social History   Socioeconomic History   Marital status: Married    Spouse name: Not on file   Number of children: Not on file   Years of education: Not on file   Highest education level: Not on file  Occupational History   Occupation: Radio producer: [employer]   Occupation: Retired from Photographer  Tobacco Use   Smoking status: Never   Smokeless tobacco: Never  Vaping Use   Vaping status: Never Used  Substance and Sexual Activity   Alcohol use: No   Drug use: No   Sexual activity: Not on file  Other Topics Concern   Not on file  Social History Narrative   Married   One child   Lives in [place] with spouse and mother in law   Previously worked as a Public librarian for [employer]      She is a Social worker counsel member.   Social Drivers of Corporate investment banker Strain: Not on file  Food Insecurity: No Food Insecurity (04/06/2024)   Hunger Vital Sign    Worried About Running Out of Food in the Last Year: Never true    Ran Out of Food in the Last Year: Never true  Transportation Needs: No Transportation Needs (04/06/2024)   PRAPARE - Administrator, Civil Service (Medical): No    Lack of Transportation (Non-Medical): No  Physical Activity: Not on file  Stress: Not on file  Social Connections:  Socially Integrated (04/06/2024)   Social Connection and Isolation Panel [NHANES]    Frequency of Communication with Friends and Family: More than three times a week    Frequency of Social Gatherings with Friends and Family: Once a week    Attends Religious Services: 1 to 4 times per year    Active Member of [employer] or Organizations: Yes    Attends Banker Meetings: 1 to 4 times per year    Marital Status: Married  Catering manager Violence: Not At Risk (04/06/2024)   Humiliation, Afraid, Rape, and Kick questionnaire    Fear of Current or Ex-Partner: No    Emotionally Abused: No    Physically Abused: No    Sexually Abused: No    Family History:   Family History  Problem Relation Age of Onset   Hypertension Mother    Glaucoma Mother    Atrial fibrillation Mother    Hypertension Father    Hypertension Brother    Hypertension Maternal Grandfather    Heart attack Maternal Grandfather        MI   Coronary artery disease Maternal Grandfather    Hypertension Paternal Grandfather    Heart attack Paternal Grandfather        MI   Coronary artery disease Paternal Grandfather    Hypertension Brother      ROS:  Please see the history of present illness.  All other ROS reviewed and negative.     Physical Exam/Data: Vitals:   04/06/24 2015 04/06/24 2305 04/06/24 2309 04/07/24 0522  BP: 111/78  106/83 (!) 148/99  Pulse: 82  80 87  Resp: 18  18 20   Temp: 98.1 F (36.7 C)  97.7 F (36.5 C) 98.1 F (36.7 C)  TempSrc: Oral  Oral Oral  SpO2: 97%  96% 100%  Weight:  119.5 kg    Height:  5\' 1"  (1.549 m)  Intake/Output Summary (Last 24 hours) at 04/07/2024 0816 Last data filed at 04/06/2024 1500 Gross per 24 hour  Intake --  Output 550 ml  Net -550 ml      04/06/2024   11:05 PM 04/05/2024    7:36 PM 04/05/2024    2:25 PM  Last 3 Weights  Weight (lbs) 263 lb 6.4 oz 261 lb 0.4 oz 261 lb  Weight (kg) 119.477 kg 118.4 kg 118.389 kg     Body mass index is 49.77 kg/m.   General:  Elderly female. Laying in the bed with head elevated, no acute distress  HEENT: normal Neck: no JVD Vascular: Radial pulses 2+ bilaterally Cardiac:  normal S1, S2; RRR; no murmur  Lungs:  clear to auscultation bilaterally, no wheezing, rhonchi or rales. Normal WOB on room air  Abd: soft, nontender  Ext: no edema in BLE  Musculoskeletal:  No deformities  Skin: warm and dry  Neuro:  CNs 2-12 intact, no focal abnormalities noted Psych:  Normal affect   EKG:  The EKG was personally reviewed and demonstrates:  A-Sensed, V-paced, HR 85 BPM  Telemetry:  Telemetry was personally reviewed and demonstrates:  A-sensed v-paced, occasional PVCs. One 10 beat run of NSVT   Relevant CV Studies: Cardiac Studies & Procedures   ______________________________________________________________________________________________     ECHOCARDIOGRAM  ECHOCARDIOGRAM COMPLETE 04/06/2024  Narrative ECHOCARDIOGRAM REPORT    Patient Name:   JENIAH KISHI Date of Exam: 04/06/2024 Medical Rec #:  [id number]      Height:       62.0 in Accession #:    [id number]     Weight:       261.0 lb Date of Birth:  1951-06-06      BSA:          2.141 m Patient Age:    73 years       BP:           126/82 mmHg Patient Gender: F              HR:           80 bpm. Exam Location:  Inpatient  Procedure: 2D Echo, Cardiac Doppler, Color Doppler and Intracardiac Opacification Agent (Both Spectral and Color Flow Doppler were utilized during procedure).  Indications:    Near Syncope  History:        Patient has prior history of Echocardiogram examinations, most recent 09/01/2022. CHF and Nonischemic Cardiomyopathy, Aortic Dissection and DVT, Arrythmias:Atrial Fibrillation, Signs/Symptoms:[name]; Risk Factors:Dyslipidemia and Hypertension.  Sonographer:    Travis Friedman RDCS Referring Phys: [id number] Juliette Oh   Sonographer Comments: Patient is obese. IMPRESSIONS   1. No mural apical thrombus  with definity . Left ventricular ejection fraction, by estimation, is <20%. The left ventricle has severely decreased function. The left ventricle demonstrates global hypokinesis. The left ventricular internal cavity size was severely dilated. Left ventricular diastolic parameters are indeterminate. 2. Device lead in RV. Right ventricular systolic function is normal. The right ventricular size is normal. 3. Left atrial size was moderately dilated. 4. Functional MR from LV dysfunction and reduced posterior leaflet motion apically displaced coaptation point. The mitral valve is abnormal. Mild to moderate mitral valve regurgitation. No evidence of mitral stenosis. 5. Mean gradient 4 peak 9 mmHg likely mild low flow AS . The aortic valve is tricuspid. There is mild calcification of the aortic valve. There is mild thickening of the aortic valve. Aortic valve regurgitation is not visualized. Aortic valve sclerosis is present,  with no evidence of aortic valve stenosis. 6. The inferior vena cava is normal in size with greater than 50% respiratory variability, suggesting right atrial pressure of 3 mmHg.  FINDINGS Left Ventricle: No mural apical thrombus with definity . Left ventricular ejection fraction, by estimation, is <20%. The left ventricle has severely decreased function. The left ventricle demonstrates global hypokinesis. Strain was performed and the global longitudinal strain is indeterminate. The left ventricular internal cavity size was severely dilated. There is no left ventricular hypertrophy. Left ventricular diastolic parameters are indeterminate.  Right Ventricle: Device lead in RV. The right ventricular size is normal. No increase in right ventricular wall thickness. Right ventricular systolic function is normal.  Left Atrium: Left atrial size was moderately dilated.  Right Atrium: Right atrial size was normal in size.  Pericardium: There is no evidence of pericardial effusion.  Mitral  Valve: Functional MR from LV dysfunction and reduced posterior leaflet motion apically displaced coaptation point. The mitral valve is abnormal. There is mild thickening of the mitral valve leaflet(s). There is mild calcification of the mitral valve leaflet(s). Mild to moderate mitral valve regurgitation. No evidence of mitral valve stenosis.  Tricuspid Valve: The tricuspid valve is normal in structure. Tricuspid valve regurgitation is mild . No evidence of tricuspid stenosis.  Aortic Valve: Mean gradient 4 peak 9 mmHg likely mild low flow AS. The aortic valve is tricuspid. There is mild calcification of the aortic valve. There is mild thickening of the aortic valve. Aortic valve regurgitation is not visualized. Aortic valve sclerosis is present, with no evidence of aortic valve stenosis. Aortic valve mean gradient measures 4.0 mmHg. Aortic valve peak gradient measures 8.5 mmHg. Aortic valve area, by VTI measures 1.14 cm.  Pulmonic Valve: The pulmonic valve was normal in structure. Pulmonic valve regurgitation is not visualized. No evidence of pulmonic stenosis.  Aorta: The aortic root is normal in size and structure.  Venous: The inferior vena cava is normal in size with greater than 50% respiratory variability, suggesting right atrial pressure of 3 mmHg.  IAS/Shunts: No atrial level shunt detected by color flow Doppler.  Additional Comments: 3D was performed not requiring image post processing on an independent workstation and was indeterminate.   LEFT VENTRICLE PLAX 2D LVIDd:         7.30 cm LVIDs:         6.75 cm LV PW:         0.90 cm LV IVS:        1.05 cm LVOT diam:     2.40 cm LV SV:         36 LV SV Index:   17 LVOT Area:     4.52 cm  LV Volumes (MOD) LV vol d, MOD A2C: 515.0 ml LV vol d, MOD A4C: 519.0 ml LV vol s, MOD A2C: 430.0 ml LV vol s, MOD A4C: 430.0 ml LV SV MOD A2C:     85.0 ml LV SV MOD A4C:     519.0 ml LV SV MOD BP:      82.1 ml  RIGHT VENTRICLE           IVC RV Basal diam:  3.50 cm  IVC diam: 2.10 cm TAPSE (M-mode): 1.1 cm  LEFT ATRIUM              Index        RIGHT ATRIUM           Index LA diam:  4.30 cm  2.01 cm/m   RA Area:     10.80 cm LA Vol (A2C):   85.9 ml  40.13 ml/m  RA Volume:   20.50 ml  9.58 ml/m LA Vol (A4C):   121.0 ml 56.52 ml/m LA Biplane Vol: 102.0 ml 47.65 ml/m AORTIC VALVE AV Area (Vmax):    1.50 cm AV Area (Vmean):   1.50 cm AV Area (VTI):     1.14 cm AV Vmax:           146.00 cm/s AV Vmean:          96.700 cm/s AV VTI:            0.315 m AV Peak Grad:      8.5 mmHg AV Mean Grad:      4.0 mmHg LVOT Vmax:         48.50 cm/s LVOT Vmean:        32.000 cm/s LVOT VTI:          0.080 m LVOT/AV VTI ratio: 0.25  AORTA Ao Root diam: 3.50 cm Ao Asc diam:  2.90 cm   SHUNTS Systemic VTI:  0.08 m Systemic Diam: 2.40 cm  Janelle Mediate MD Electronically signed by Janelle Mediate MD Signature Date/Time: 04/06/2024/12:27:47 PM    Final    MONITORS  LONG TERM MONITOR (3-14 DAYS) 01/18/2024  Narrative Patch Wear Time:  4 days and 18 hours  HR 40 - 203, average 74 bpm. 3 nonsustained SVT (longest 2 minutes 46 seconds beats) and 1 nonsustained VT (longest 5 beats) No atrial fibrillation detected. Rare supraventricular ectopy. Rare ventricular ectopy. No symptom trigger episodes.   [facility] Cardiac Electrophysiology       ______________________________________________________________________________________________       Laboratory Data: High Sensitivity Troponin:   Recent Labs  Lab 04/05/24 1631 04/05/24 2010  TROPONINIHS 61* 57*     Chemistry Recent Labs  Lab 04/05/24 1631 04/06/24 0455 04/06/24 1849  NA 141 135  --   K 3.8 2.9* 4.4  CL 100 102  --   CO2 27 24  --   GLUCOSE 116* 89  --   BUN 14 14  --   CREATININE 1.45* 1.09*  --   CALCIUM  9.2 7.3*  --   MG 1.8  --   --   GFRNONAA 38* 54*  --   ANIONGAP 14 9  --     Recent Labs  Lab 04/05/24 1631  PROT  7.1  ALBUMIN 3.2*  AST 24  ALT 11  ALKPHOS 79  BILITOT 0.8   Lipids No results for input(s): "CHOL", "TRIG", "HDL", "LABVLDL", "LDLCALC", "CHOLHDL" in the last 168 hours.  Hematology Recent Labs  Lab 04/05/24 1631  WBC 7.7  RBC 4.58  HGB 14.2  HCT 43.1  MCV 94.1  MCH 31.0  MCHC 32.9  RDW 15.2  PLT 132*   Thyroid   Recent Labs  Lab 04/05/24 2010  TSH 1.968    BNP Recent Labs  Lab 04/06/24 0455  BNP 341.5*    DDimer No results for input(s): "DDIMER" in the last 168 hours.  Radiology/Studies:  ECHOCARDIOGRAM COMPLETE Result Date: 04/06/2024    ECHOCARDIOGRAM REPORT   Patient Name:   CHANEQUA SPEES Date of Exam: 04/06/2024 Medical Rec #:  [id number]      Height:       62.0 in Accession #:    [id number]     Weight:       261.0 lb Date of  Birth:  1951-09-01      BSA:          2.141 m Patient Age:    73 years       BP:           126/82 mmHg Patient Gender: F              HR:           80 bpm. Exam Location:  Inpatient Procedure: 2D Echo, Cardiac Doppler, Color Doppler and Intracardiac            Opacification Agent (Both Spectral and Color Flow Doppler were            utilized during procedure). Indications:    Near Syncope  History:        Patient has prior history of Echocardiogram examinations, most                 recent 09/01/2022. CHF and Nonischemic Cardiomyopathy, Aortic                 Dissection and DVT, Arrythmias:Atrial Fibrillation,                 Signs/Symptoms:[name]; Risk Factors:Dyslipidemia                 and Hypertension.  Sonographer:    Travis Friedman RDCS Referring Phys: [id number] Juliette Oh  Sonographer Comments: Patient is obese. IMPRESSIONS  1. No mural apical thrombus with definity . Left ventricular ejection fraction, by estimation, is <20%. The left ventricle has severely decreased function. The left ventricle demonstrates global hypokinesis. The left ventricular internal cavity size was severely dilated. Left ventricular diastolic parameters are  indeterminate.  2. Device lead in RV. Right ventricular systolic function is normal. The right ventricular size is normal.  3. Left atrial size was moderately dilated.  4. Functional MR from LV dysfunction and reduced posterior leaflet motion apically displaced coaptation point. The mitral valve is abnormal. Mild to moderate mitral valve regurgitation. No evidence of mitral stenosis.  5. Mean gradient 4 peak 9 mmHg likely mild low flow AS . The aortic valve is tricuspid. There is mild calcification of the aortic valve. There is mild thickening of the aortic valve. Aortic valve regurgitation is not visualized. Aortic valve sclerosis is present, with no evidence of aortic valve stenosis.  6. The inferior vena cava is normal in size with greater than 50% respiratory variability, suggesting right atrial pressure of 3 mmHg. FINDINGS  Left Ventricle: No mural apical thrombus with definity . Left ventricular ejection fraction, by estimation, is <20%. The left ventricle has severely decreased function. The left ventricle demonstrates global hypokinesis. Strain was performed and the global longitudinal strain is indeterminate. The left ventricular internal cavity size was severely dilated. There is no left ventricular hypertrophy. Left ventricular diastolic parameters are indeterminate. Right Ventricle: Device lead in RV. The right ventricular size is normal. No increase in right ventricular wall thickness. Right ventricular systolic function is normal. Left Atrium: Left atrial size was moderately dilated. Right Atrium: Right atrial size was normal in size. Pericardium: There is no evidence of pericardial effusion. Mitral Valve: Functional MR from LV dysfunction and reduced posterior leaflet motion apically displaced coaptation point. The mitral valve is abnormal. There is mild thickening of the mitral valve leaflet(s). There is mild calcification of the mitral valve leaflet(s). Mild to moderate mitral valve regurgitation. No  evidence of mitral valve stenosis. Tricuspid Valve: The tricuspid valve is normal in structure.  Tricuspid valve regurgitation is mild . No evidence of tricuspid stenosis. Aortic Valve: Mean gradient 4 peak 9 mmHg likely mild low flow AS. The aortic valve is tricuspid. There is mild calcification of the aortic valve. There is mild thickening of the aortic valve. Aortic valve regurgitation is not visualized. Aortic valve sclerosis is present, with no evidence of aortic valve stenosis. Aortic valve mean gradient measures 4.0 mmHg. Aortic valve peak gradient measures 8.5 mmHg. Aortic valve area, by VTI measures 1.14 cm. Pulmonic Valve: The pulmonic valve was normal in structure. Pulmonic valve regurgitation is not visualized. No evidence of pulmonic stenosis. Aorta: The aortic root is normal in size and structure. Venous: The inferior vena cava is normal in size with greater than 50% respiratory variability, suggesting right atrial pressure of 3 mmHg. IAS/Shunts: No atrial level shunt detected by color flow Doppler. Additional Comments: 3D was performed not requiring image post processing on an independent workstation and was indeterminate.  LEFT VENTRICLE PLAX 2D LVIDd:         7.30 cm LVIDs:         6.75 cm LV PW:         0.90 cm LV IVS:        1.05 cm LVOT diam:     2.40 cm LV SV:         36 LV SV Index:   17 LVOT Area:     4.52 cm  LV Volumes (MOD) LV vol d, MOD A2C: 515.0 ml LV vol d, MOD A4C: 519.0 ml LV vol s, MOD A2C: 430.0 ml LV vol s, MOD A4C: 430.0 ml LV SV MOD A2C:     85.0 ml LV SV MOD A4C:     519.0 ml LV SV MOD BP:      82.1 ml RIGHT VENTRICLE          IVC RV Basal diam:  3.50 cm  IVC diam: 2.10 cm TAPSE (M-mode): 1.1 cm LEFT ATRIUM              Index        RIGHT ATRIUM           Index LA diam:        4.30 cm  2.01 cm/m   RA Area:     10.80 cm LA Vol (A2C):   85.9 ml  40.13 ml/m  RA Volume:   20.50 ml  9.58 ml/m LA Vol (A4C):   121.0 ml 56.52 ml/m LA Biplane Vol: 102.0 ml 47.65 ml/m  AORTIC VALVE  AV Area (Vmax):    1.50 cm AV Area (Vmean):   1.50 cm AV Area (VTI):     1.14 cm AV Vmax:           146.00 cm/s AV Vmean:          96.700 cm/s AV VTI:            0.315 m AV Peak Grad:      8.5 mmHg AV Mean Grad:      4.0 mmHg LVOT Vmax:         48.50 cm/s LVOT Vmean:        32.000 cm/s LVOT VTI:          0.080 m LVOT/AV VTI ratio: 0.25  AORTA Ao Root diam: 3.50 cm Ao Asc diam:  2.90 cm  SHUNTS Systemic VTI:  0.08 m Systemic Diam: 2.40 cm Janelle Mediate MD Electronically signed by Janelle Mediate MD Signature Date/Time: 04/06/2024/12:27:47 PM  Final    DG Chest Port 1 View Result Date: 04/05/2024 CLINICAL DATA:  [name]. EXAM: PORTABLE CHEST 1 VIEW COMPARISON:  01/10/2019. FINDINGS: Stable cardiomegaly and left ventricular prominence. Left-sided pacemaker/AICD in place. Mild left basilar opacity could reflect overlying soft tissue, atelectasis, or infiltrate. The right lung is clear. No sizable pleural effusion or pneumothorax. Surgical clips overlying the right breast. No acute osseous abnormality. IMPRESSION: Stable cardiomegaly. Mild left basilar opacity could reflect overlying soft tissue, atelectasis, or infiltrate. Electronically Signed   By: Mannie Seek M.D.   On: 04/05/2024 15:52     Assessment and Plan:  Near-Syncope  Orthostatic Hypotension  - Patient reports having about 4 episodes of near syncope in the past 1-2 weeks.  3 of these episodes occurred while she was standing up from the commode, 1 episode occurred shortly after standing up and taking a few steps while at her eye doctor's office.  Describes feeling of lightheadedness and feeling her vision start to tunnel. Resolves after she sits back down. No associated chest pain, palpitations, shortness of breath  - Patient has had 30 lbs of intentional weight loss in the past few months. She also has been having some nausea and has vomited a few times in the past week.  - Creatinine initially elevated to 1.45 in the ED. Device  interrogation in the ED showed no significant arrhythmias  - Echo this admission showed EF stable at <20%. No mural apical thrombus. Normal RV function. Functional MR from LV dysfunction, mild-moderate MR, likely mild low flow AS  - initial orthostatic vital signs in the ED showed BP drop from 103/70 lying to 72/59 sitting  - This AM, BP 107/73 lying, 98/83 sitting. Asymptomatic.  She was able to get out of bed and walk with PT without dizziness this AM  - Suspect her recent weight loss and vomiting contributed to her orthostatic hypotension and low BP  - Decrease carvedilol  to 3.125 mg Bid. Hold lasix . Consider PRN lasix  at DC  - Discussed use of compression stockings. She drinks around 64 ounces of fluids daily, mostly water but some soda   Chronic Systolic heart failure  Nonischemic cardiomyopathy  - EF has been stable around 10-20% since 2012 - Echo this admission with EF <20%, normal RV function  - Overall euvolemic on exam  - As above, decrease carvedilol  to 3.125 mg BID - She has been on PO lasix  40 mg daily. Continue to hold with low BP. Consider PRN at DC  - Continue spironolactone  25 mg daily - if BP continues to be low, could decrease to 12.5  - Continue entresto  24/26 mg BID  - Continue corlanor 2.5 mg BID (patient was recently noted to have a brief episode of afib, with low burden, OK to continue corlanor for now)   PAF - Patient had a brief episode of afib in 2012. More recently had a brief episode on 6/4  - In predominantly a-sensed, v-paced rhythm here. 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 Also noted likely mild low flow AS with mean gradient 4, peak 9 - No indication for intervention at this time  - Anticipate repeating echocardiogram in 6-12 months for monitoring   S/p ICD  PVCs - Per chart review, patient has  approx 7% PVC burden  - ICD followed by  EP  - On telemetry, patient has occasional PVCs. Had one asymptomatic 10 beat run of NSVT, note K was 2.9 at the time  - Continue BB  - K now 4.4, mag 1.8   MD to see and finalize med recs   Risk Assessment/Risk Scores:   [place]  Heart Association (NYHA) Functional Class NYHA Class I  CHA2DS2-VASc Score = 4   This indicates a 4.8% annual risk of stroke. The patient's score is based upon: CHF History: 1 HTN History: 1 Diabetes History: 0 Stroke History: 0 Vascular Disease History: 0 Age Score: 1 Gender Score: 1   For questions or updates, please contact [facility] Please consult [email or web address] for contact info under    Signed, Debria Fang, PA-C  04/07/2024 8:16 AM   Attending Note:   The patient was seen and examined.  Agree with assessment and plan as noted above.  Changes made to the above note as needed.  Patient seen and independently examined with Duwayne Ginsberg , PA .   We discussed all aspects of the encounter. I agree with the assessment and plan as stated above.   Orthostatic hypotension: Lanice presents with near syncope/orthostatic hypotension.  She notes a 30 pound weight loss over the past couple of months.  I suspect that her baseline blood pressure has gone down and now she is having symptoms of orthostatic hypotension.  Our goal would be to resume her Entresto  at the current dose of 24-26 twice a day.  I would reduce the dose of Lasix  or perhaps hold it altogether and make it as needed.  I would continue spironolactone  if she tolerates it.  Will gradually restart her medications. Will hopefull be able to restart her enteresto this am.  Hold lasix  for now  Continue coreg  at a lower dose ( 3.125 BID)       I have spent a total of 40 minutes with patient reviewing hospital  notes , telemetry, EKGs, labs and examining patient as well as establishing an assessment and plan that was discussed with  the patient.  > 50% of time was spent in direct patient care.     [place], Marieta Shorten., MD, [facility] 04/07/2024, 11:01 AM [address],  [address] Office [phone] Pager [phone]

## 2024-04-08 ENCOUNTER — Ambulatory Visit (INDEPENDENT_AMBULATORY_CARE_PROVIDER_SITE_OTHER)

## 2024-04-08 ENCOUNTER — Ambulatory Visit: Attending: Cardiology | Admitting: Pharmacist Clinician (PhC)/ Clinical Pharmacy Specialist

## 2024-04-08 DIAGNOSIS — Z95810 Presence of automatic (implantable) cardiac defibrillator: Secondary | ICD-10-CM

## 2024-04-08 DIAGNOSIS — R55 Syncope and collapse: Secondary | ICD-10-CM | POA: Diagnosis not present

## 2024-04-08 DIAGNOSIS — I480 Paroxysmal atrial fibrillation: Secondary | ICD-10-CM | POA: Diagnosis not present

## 2024-04-08 DIAGNOSIS — I951 Orthostatic hypotension: Secondary | ICD-10-CM | POA: Diagnosis not present

## 2024-04-08 DIAGNOSIS — I5022 Chronic systolic (congestive) heart failure: Secondary | ICD-10-CM | POA: Diagnosis not present

## 2024-04-08 LAB — BASIC METABOLIC PANEL WITH GFR
Anion gap: 7 (ref 5–15)
BUN: 16 mg/dL (ref 8–23)
CO2: 26 mmol/L (ref 22–32)
Calcium: 8.8 mg/dL — ABNORMAL LOW (ref 8.9–10.3)
Chloride: 104 mmol/L (ref 98–111)
Creatinine, Ser: 0.89 mg/dL (ref 0.44–1.00)
GFR, Estimated: >60 mL/min (ref >60)
Glucose, Bld: 119 mg/dL — ABNORMAL HIGH (ref 70–99)
Potassium: 4.3 mmol/L (ref 3.5–5.1)
Sodium: 137 mmol/L (ref 135–145)

## 2024-04-08 LAB — MAGNESIUM: Magnesium: 2.1 mg/dL (ref 1.7–2.4)

## 2024-04-08 MED ORDER — SPIRONOLACTONE 12.5 MG HALF TABLET: 12.5000 mg | ORAL_TABLET | Freq: Every day | ORAL | Status: DC

## 2024-04-08 MED ORDER — NYSTATIN 100000 UNIT/GM EX POWD
Freq: Two times a day (BID) | CUTANEOUS | Status: DC
  Filled 2024-04-08: qty 15

## 2024-04-08 MED ORDER — SPIRONOLACTONE 25 MG PO TABS: 12.5000 mg | ORAL_TABLET | Freq: Every day | ORAL | Status: DC

## 2024-04-08 MED ORDER — NYSTATIN 100000 UNIT/GM EX POWD: Freq: Two times a day (BID) | CUTANEOUS | 0 refills | Status: DC

## 2024-04-08 NOTE — Progress Notes (Signed)
 Reviewed AVS, patient expressed understanding of medications, MD follow up reviewed.   Removed IV, Site clean, dry and intact.  Patient states all belongings brought to the hospital at time of admission are accounted for and packed to take home.  Patient informed and expressed understanding of where to pick up discharge medications.

## 2024-04-08 NOTE — Progress Notes (Addendum)
 Mobility Specialist Progress Note:    04/08/24 0857  Mobility  Activity Ambulated with assistance to bathroom  Level of Assistance Contact guard assist, steadying assist  Assistive Device Front wheel walker  Distance Ambulated (ft) 12 ft  Activity Response Tolerated well  Mobility Referral Yes  Mobility visit 1 Mobility  Mobility Specialist Start Time (ACUTE ONLY) [id number]  Mobility Specialist Stop Time (ACUTE ONLY) 0857  Mobility Specialist Time Calculation (min) (ACUTE ONLY) 4 min   Pt received in bed, requesting assistance to ambulate to bathroom. Tolerated well, c/o lethargy. Pt would doze off prior to mobility, SpO2 84% on RA. When awake, SpO2 WFL. C/o SOB. Void successful, returned back to bed via nursing staff. Left with all needs met, call bell in reach.   [name] Mobility Specialist Please contact via Special educational needs teacher or  Rehab office at [phone]

## 2024-04-08 NOTE — Progress Notes (Addendum)
 CCM called patient O2 sats down in low 80's. Patient sleeping has sleep apnea. And recently Dx. With it. Future plans are for CPAP. O2 sats up in mid the high 90's on oxygen at 2 liters per [place].Cont to monitor patient.

## 2024-04-08 NOTE — Progress Notes (Addendum)
 Progress Note  Patient Name: [name] Date of Encounter: 04/08/2024 [facility] Cardiologist: Jules Oar, MD   Interval Summary    73 y.o. 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   Patient states she feels most awake today than the past few days. She has been walking to the bathroom overnight and this morning, no dizziness, but feels very slow and sluggish today. She denied any chest pain, resting SOB, or leg edema. She has been tolerate PO intake. She states her CPAP machine is yet to come for OSA, not normally on oxygen at baseline.    Vital Signs Vitals:   04/07/24 1942 04/07/24 2308 04/08/24 0416 04/08/24 0759  BP: 103/63 (!) 98/56 102/68 121/64  Pulse:  77 82 93  Resp: 17 16 17 19   Temp: 98 F (36.7 C) 98.1 F (36.7 C) 97.9 F (36.6 C) 97.7 F (36.5 C)  TempSrc: Oral Oral Oral Oral  SpO2: 96% 93%  97%  Weight:      Height:        Intake/Output Summary (Last 24 hours) at 04/08/2024 1034 Last data filed at 04/08/2024 0900 Gross per 24 hour  Intake 420 ml  Output 1450 ml  Net -1030 ml      04/06/2024   11:05 PM 04/05/2024    7:36 PM 04/05/2024    2:25 PM  Last 3 Weights  Weight (lbs) 263 lb 6.4 oz 261 lb 0.4 oz 261 lb  Weight (kg) 119.477 kg 118.4 kg 118.389 kg      Telemetry/ECG  AV paced rhythm, NSVT and PVCs   - Personally Reviewed  Physical Exam  GEN: No acute distress.  Obese  Neck: Difficult assess due to thick neck  Cardiac: Heart sound distant to ascultation, RRR, no murmurs Respiratory: Clear to auscultation bilaterally. Diminished at base. On 2LNC, Pox 99%. Speaks full sentence  GI: Soft, nontender, non-distended  MS: No leg  edema  Assessment & Plan   Near-syncope  Orthostatic Hypotension  - Per history: 4 episodes of near syncope in the past 1-2 weeks.  3 of these episodes occurred while she was standing up from the commode, 1 episode occurred shortly after standing up and taking a few steps while at her eye doctor's office.  Describes feeling of lightheadedness and feeling her vision start to tunnel. Resolves after she sits back down. No associated chest pain, palpitations, shortness of breath. Had intentional weight loss >30 IBS - Creatinine initially elevated to 1.45 suggestive of AKI, down to 1.09 on 04/06/24, will repeat BMP and Mag today  - Device interrogation in the ED and telemetry here showed no significant arrhythmias - Echo 04/06/24 showed EF stable at <20%. No mural apical thrombus. Normal RV function. Functional MR from LV dysfunction, mild-moderate MR, likely mild low flow AS  - Initial orthostatic vital signs in the ED showed BP drop from 103/70 lying to 72/59 sitting  - Suspect due to dehydration, BP as low as 80/46 in the past 24 hours, will reduce spironolactone  to 12.5mg  daily, continue Entresto  24/26mg  BID, continue PRN Lasix  going froward for GDMT  - Please repeat orthostatic VS, ambulate   Elevated troponin - Hs trop 61 >57, likely demand ischemia  - she has no chest  pain, near syncope is due to orthostatic hypotension   Chronic Systolic heart failure  Nonischemic cardiomyopathy (chemotherapy induced)  - EF has been stable around 10-20% since 2012 - Echo this admission with EF <20%, normal RV function  - Overall euvolemic on exam  - GDMT: Due to near-syncope and orthostatic hypotension and AKI, stopped PTA coreg , change lasix  to PRN, reduce spironolactone  to 12.5mg  daily, continue Entresto  24/26mg  BID; if BP remains low, would drop spironolactone  and further reduce Entresto    PAF - Patient had a brief episode of afib in 2012. More recently had a brief episode on 6/4  - paced rhythm here -  Continue PTA eliquis  5 mg BID  - PTA coreg  and corlanor stopped, HR as low as 50s, if rate control becomes a need, can add low dose metoprolol  back when BP stable   MR  AS - Echo this admission noted functional MR from LV dysfunction, mild-moderate MR. Also noted likely mild low flow AS with mean gradient 4, peak 9 - No indication for intervention at this time, anticipate repeating echocardiogram in 6-12 months for monitoring    St jude ICD in situ  PVC and NSVTs  - hx of St Jude Bi-V ICD implantation Sept 2012 but unable to place LV lead. She eventually brought back in Nov 2012 for left thoracotomy and placement of epicardial LV lead placement by Dr. Matt Song.  - Per chart review, patient has approx 7% PVC burden  - ICD followed by EP Dr Lawana Pray  - On telemetry, patient has occasional PVCs. intermittent NSVT in the setting of hypokalemia 2.9  - Please Keep K >4 and Mag > 2  - add back low dose metoprolol  if BP allows       For questions or updates, please contact [facility] Please consult [email or web address] for contact info under       Signed, Xika Zhao, NP   Attending Note:   The patient was seen and examined.  Agree with assessment and plan as noted above.  Changes made to the above note as needed.  Patient seen and independently examined with Xika Zhao, NP .   We discussed all aspects of the encounter. I agree with the assessment and plan as stated above.    Orthostatic hypotension : BP is better.     Continue entresto  24-26 BID,  spiro 12. 5 every day Will have her follow up in the CHF clinic soon    2.  Sinus tach :  HR looks good now  Coreg  is on hold due to hypotension  At this point , I doint think she needs corlanor - she was previously on 5 mg a day  .       I have spent a total of 40 minutes with patient reviewing hospital  notes , telemetry, EKGs, labs and examining patient as well as establishing an assessment and plan that was discussed with the  patient.  > 50% of time was spent in direct patient care.    [place], Marieta Shorten., MD, [facility] 04/08/2024, 1:31 PM [address],  [address] Office [phone] Pager [phone]

## 2024-04-08 NOTE — TOC Transition Note (Signed)
 Transition of Care [facility]) - Discharge Note Sherin Dingwall RN, BSN Transitions of Care Unit 4E- RN Case Manager See Treatment Team for direct phone #   Patient Details  Name: [name] MRN: [id number] Date of Birth: [birth year]  [facility] CM/SW Contact:  Rox Cope, RN Phone Number: 04/08/2024, 1:54 PM   Clinical Narrative:    Pt stable for transition home today, Orders for HHPT placed. Noted per previous CM- referral has been sent to [facility] and accepted.   CM notified liaison for [facility] for start of care.   Family to transport home, No further TOC needs noted.    Final next level of care: Home w Home Health Services Barriers to Discharge: Barriers Resolved   Patient Goals and CMS Choice Patient states their goals for this hospitalization and ongoing recovery are:: go home better and get some PT          Discharge Placement               Home w/ [facility]        Discharge Plan and Services Additional resources added to the After Visit Summary for     Discharge Planning Services: CM Consult Post Acute Care Choice: Home Health          DME Arranged: N/A DME Agency: NA       HH Arranged: PT, OT HH Agency: [facility] Date [facility] Agency Contacted: 04/06/24 Time HH Agency Contacted: 1520 Representative spoke with at [facility] Agency: Amy  Social Drivers of Health (SDOH) Interventions SDOH Screenings   Food Insecurity: No Food Insecurity (04/06/2024)  Housing: Low Risk  (04/06/2024)  Transportation Needs: No Transportation Needs (04/06/2024)  Utilities: Not At Risk (04/06/2024)  Social Connections: Socially Integrated (04/06/2024)  Tobacco Use: Low Risk  (04/05/2024)     Readmission Risk Interventions     No data to display

## 2024-04-08 NOTE — Discharge Summary (Signed)
 Physician Discharge Summary  [name] NWG:[id number] DOB: 1951-07-20 DOA: 04/05/2024  PCP: Abbe Hoard., MD  Admit date: 04/05/2024 Discharge date: 04/08/2024  Admitted From: home Disposition:  home  Recommendations for Outpatient Follow-up:  Follow up with PCP in 1-2 weeks Follow-up with heart failure clinic within 1 week  Home Health: PT Equipment/Devices: none  Discharge Condition: stable CODE STATUS: Full code Diet Orders (From admission, onward)     Start     Ordered   04/06/24 0336  Diet Heart Room service appropriate? Yes; Fluid consistency: Thin  Diet effective now       Question Answer Comment  Room service appropriate? Yes   Fluid consistency: Thin      04/06/24 0336            Brief Narrative / Interim history: 73 year old female with advanced heart failure, seen in the CHF clinic here with few presyncopal episodes, about 4 in the last 2 weeks, most recent 1 the day prior to admission. She has lost, intentionally, about 30 pounds and has noted that her blood pressure has been on the low side.  Cardiology consulted   Hospital Course / Discharge diagnoses: Principal Problem:   Near syncope Active Problems:   Essential hypertension   Chronic systolic heart failure (HCC)   A-fib (HCC)   Hypothyroidism   Principal problem Near syncope-patient with recent 30 pound weight loss, intentional, and has been noticing that her blood pressure has been on the soft slightly high at home.  Cardiology consulted and evaluated patient, patient for now recommending to hold furosemide  and Coreg , and continue her other home medications and spironolactone  cut in half.  She did well on this regimen while here, blood pressures have been stable and she has been symptom free.  She will be discharged home in stable condition and have close outpatient follow-up   Active problems Chronic systolic CHF, AICD in place -most recent 2D echo done on admission shows EF less than 20%.   Continue goal-directed medical therapy as tolerated.  Has a degree of lower extremity swelling but she feels at baseline Essential hypertension-medications as tolerated Hypothyroidism-continue Synthroid CKD 3A-creatinine at baseline PAF-continue Eliquis   Sepsis ruled out   Discharge Instructions   Allergies as of 04/08/2024       Reactions   Alphagan [brimonidine]    Irritation    Mobic [meloxicam] Other (See Comments)   Cannot take due to heart issue   Nsaids Other (See Comments)   Cannot take due to heart issue   Polyvinyl Alcohol Other (See Comments)   Travatan and Alphagan(irritation)   Statins Other (See Comments)   Per pt her cardiologist told her not to take   Tolmetin Other (See Comments)   Can not take due to heart issue   Travatan Z [travoprost (bak Free)]    Irritation   Tape Rash   Uncoded Allergy. Allergen: Tape Blister from clear tapes.        Medication List     STOP taking these medications    carvedilol  6.25 MG tablet Commonly known as: COREG    furosemide  40 MG tablet Commonly known as: LASIX        TAKE these medications    allopurinol  300 MG tablet Commonly known as: ZYLOPRIM  Take 300 mg by mouth daily.   apixaban  5 MG Tabs tablet Commonly known as: ELIQUIS  Take 1 tablet (5 mg total) by mouth 2 (two) times daily.   bimatoprost  0.03 % ophthalmic solution Commonly known as:  LUMIGAN  Place 1 drop into both eyes at bedtime. Reported on 01/12/2016   Calcium -Vitamin D3 600-200 MG-UNIT Tabs Take 1 tablet by mouth 2 (two) times daily.   cetirizine 10 MG tablet Commonly known as: ZYRTEC Take 10 mg by mouth at bedtime.   clotrimazole-betamethasone cream Commonly known as: LOTRISONE Apply 1 Application topically 2 (two) times daily as needed (Rash).   Colcrys  0.6 MG tablet Generic drug: colchicine  TAKE ONE TABLET BY MOUTH EVERY DAY   cyanocobalamin 1000 MCG tablet Commonly known as: VITAMIN B12 Take 1,000 mcg by mouth daily.    cyclobenzaprine  10 MG tablet Commonly known as: Flexeril  Take 1 tablet (10 mg total) by mouth 3 (three) times daily as needed for muscle spasms.   Delsym 30 MG/5ML liquid Generic drug: dextromethorphan Take 5 mLs by mouth daily as needed for cough.   Denta 5000 Plus 1.1 % Crea dental cream Generic drug: sodium fluoride Place 1 Application onto teeth 2 (two) times daily.   diclofenac  sodium 1 % Gel Commonly known as: VOLTAREN  APPLY THREE GRAMS TO THREE LARGE JOINTS UP TO THREE TIMES DAILY AS NEEDED   docusate sodium  100 MG capsule Commonly known as: COLACE Take 100 mg by mouth 2 (two) times daily as needed for mild constipation.   Entresto  24-26 MG Generic drug: sacubitril -valsartan  TAKE ONE TABLET BY MOUTH TWICE DAILY   famotidine  20 MG tablet Commonly known as: PEPCID  Take 20 mg by mouth 2 (two) times daily.   ipratropium 0.03 % nasal spray Commonly known as: ATROVENT Place 2 sprays into both nostrils daily as needed for rhinitis.   ivabradine  5 MG Tabs tablet Commonly known as: [name] Take 0.5 tablets (2.5 mg total) by mouth 2 (two) times daily. If hr greater than 70 What changed:  when to take this additional instructions   levothyroxine 25 MCG tablet Commonly known as: SYNTHROID Take 25 mcg by mouth daily.   montelukast  10 MG tablet Commonly known as: SINGULAIR  Take 10 mg by mouth at bedtime. For allergies & congestion   nystatin powder Commonly known as: MYCOSTATIN/NYSTOP Apply topically 2 (two) times daily.   OCUVITE PO Take 5 mg by mouth daily.   omeprazole  40 MG capsule Commonly known as: PRILOSEC TAKE ONE CAPSULE BY MOUTH TWICE DAILY   PARoxetine  30 MG tablet Commonly known as: PAXIL  Take 30 mg by mouth at bedtime.   rosuvastatin  5 MG tablet Commonly known as: CRESTOR  TAKE ONE TABLET BY MOUTH EVERY EVENING   sodium chloride  0.65 % Soln nasal spray Commonly known as: OCEAN Place 1 spray into both nostrils daily as needed for  congestion.   spironolactone  25 MG tablet Commonly known as: ALDACTONE  Take 0.5 tablets (12.5 mg total) by mouth daily. What changed: how much to take   SYSTANE OP Place 1 drop into both eyes daily as needed (dry eyes).   traMADol  50 MG tablet Commonly known as: ULTRAM  Take 50 mg by mouth every 6 (six) hours as needed for pain.   Vitamin D 125 MCG (5000 UT) Caps Take 5,000 Units by mouth daily.   vitamin E 1000 UNIT capsule Take 1,000 Units by mouth daily.        Follow-up Information     [facility] Follow up.   Why: They will call you to set up services of PT and OT               Consultations: Cardiology   Procedures/Studies:  ECHOCARDIOGRAM COMPLETE Result Date: 04/06/2024    ECHOCARDIOGRAM  REPORT   Patient Name:   DAFNE NIELD Date of Exam: 04/06/2024 Medical Rec #:  [id number]      Height:       62.0 in Accession #:    [id number]     Weight:       261.0 lb Date of Birth:  [birth year]      BSA:          2.141 m Patient Age:    73 years       BP:           126/82 mmHg Patient Gender: F              HR:           80 bpm. Exam Location:  Inpatient Procedure: 2D Echo, Cardiac Doppler, Color Doppler and Intracardiac            Opacification Agent (Both Spectral and Color Flow Doppler were            utilized during procedure). Indications:    Near Syncope  History:        Patient has prior history of Echocardiogram examinations, most                 recent 09/01/2022. CHF and Nonischemic Cardiomyopathy, Aortic                 Dissection and DVT, Arrythmias:Atrial Fibrillation,                 Signs/Symptoms:[name]; Risk Factors:Dyslipidemia                 and Hypertension.  Sonographer:    Travis Friedman RDCS Referring Phys: [id number] Juliette Oh  Sonographer Comments: Patient is obese. IMPRESSIONS  1. No mural apical thrombus with definity . Left ventricular ejection fraction, by estimation, is <20%. The left ventricle has severely decreased function. The  left ventricle demonstrates global hypokinesis. The left ventricular internal cavity size was severely dilated. Left ventricular diastolic parameters are indeterminate.  2. Device lead in RV. Right ventricular systolic function is normal. The right ventricular size is normal.  3. Left atrial size was moderately dilated.  4. Functional MR from LV dysfunction and reduced posterior leaflet motion apically displaced coaptation point. The mitral valve is abnormal. Mild to moderate mitral valve regurgitation. No evidence of mitral stenosis.  5. Mean gradient 4 peak 9 mmHg likely mild low flow AS . The aortic valve is tricuspid. There is mild calcification of the aortic valve. There is mild thickening of the aortic valve. Aortic valve regurgitation is not visualized. Aortic valve sclerosis is present, with no evidence of aortic valve stenosis.  6. The inferior vena cava is normal in size with greater than 50% respiratory variability, suggesting right atrial pressure of 3 mmHg. FINDINGS  Left Ventricle: No mural apical thrombus with definity . Left ventricular ejection fraction, by estimation, is <20%. The left ventricle has severely decreased function. The left ventricle demonstrates global hypokinesis. Strain was performed and the global longitudinal strain is indeterminate. The left ventricular internal cavity size was severely dilated. There is no left ventricular hypertrophy. Left ventricular diastolic parameters are indeterminate. Right Ventricle: Device lead in RV. The right ventricular size is normal. No increase in right ventricular wall thickness. Right ventricular systolic function is normal. Left Atrium: Left atrial size was moderately dilated. Right Atrium: Right atrial size was normal in size. Pericardium: There is no evidence of pericardial effusion. Mitral Valve: Functional MR from LV dysfunction  and reduced posterior leaflet motion apically displaced coaptation point. The mitral valve is abnormal. There is  mild thickening of the mitral valve leaflet(s). There is mild calcification of the mitral valve leaflet(s). Mild to moderate mitral valve regurgitation. No evidence of mitral valve stenosis. Tricuspid Valve: The tricuspid valve is normal in structure. Tricuspid valve regurgitation is mild . No evidence of tricuspid stenosis. Aortic Valve: Mean gradient 4 peak 9 mmHg likely mild low flow AS. The aortic valve is tricuspid. There is mild calcification of the aortic valve. There is mild thickening of the aortic valve. Aortic valve regurgitation is not visualized. Aortic valve sclerosis is present, with no evidence of aortic valve stenosis. Aortic valve mean gradient measures 4.0 mmHg. Aortic valve peak gradient measures 8.5 mmHg. Aortic valve area, by VTI measures 1.14 cm. Pulmonic Valve: The pulmonic valve was normal in structure. Pulmonic valve regurgitation is not visualized. No evidence of pulmonic stenosis. Aorta: The aortic root is normal in size and structure. Venous: The inferior vena cava is normal in size with greater than 50% respiratory variability, suggesting right atrial pressure of 3 mmHg. IAS/Shunts: No atrial level shunt detected by color flow Doppler. Additional Comments: 3D was performed not requiring image post processing on an independent workstation and was indeterminate.  LEFT VENTRICLE PLAX 2D LVIDd:         7.30 cm LVIDs:         6.75 cm LV PW:         0.90 cm LV IVS:        1.05 cm LVOT diam:     2.40 cm LV SV:         36 LV SV Index:   17 LVOT Area:     4.52 cm  LV Volumes (MOD) LV vol d, MOD A2C: 515.0 ml LV vol d, MOD A4C: 519.0 ml LV vol s, MOD A2C: 430.0 ml LV vol s, MOD A4C: 430.0 ml LV SV MOD A2C:     85.0 ml LV SV MOD A4C:     519.0 ml LV SV MOD BP:      82.1 ml RIGHT VENTRICLE          IVC RV Basal diam:  3.50 cm  IVC diam: 2.10 cm TAPSE (M-mode): 1.1 cm LEFT ATRIUM              Index        RIGHT ATRIUM           Index LA diam:        4.30 cm  2.01 cm/m   RA Area:     10.80 cm LA  Vol (A2C):   85.9 ml  40.13 ml/m  RA Volume:   20.50 ml  9.58 ml/m LA Vol (A4C):   121.0 ml 56.52 ml/m LA Biplane Vol: 102.0 ml 47.65 ml/m  AORTIC VALVE AV Area (Vmax):    1.50 cm AV Area (Vmean):   1.50 cm AV Area (VTI):     1.14 cm AV Vmax:           146.00 cm/s AV Vmean:          96.700 cm/s AV VTI:            0.315 m AV Peak Grad:      8.5 mmHg AV Mean Grad:      4.0 mmHg LVOT Vmax:         48.50 cm/s LVOT Vmean:        32.000 cm/s  LVOT VTI:          0.080 m LVOT/AV VTI ratio: 0.25  AORTA Ao Root diam: 3.50 cm Ao Asc diam:  2.90 cm  SHUNTS Systemic VTI:  0.08 m Systemic Diam: 2.40 cm Janelle Mediate MD Electronically signed by Janelle Mediate MD Signature Date/Time: 04/06/2024/12:27:47 PM    Final    DG Chest Port 1 View Result Date: 04/05/2024 CLINICAL DATA:  [name]. EXAM: PORTABLE CHEST 1 VIEW COMPARISON:  01/10/2019. FINDINGS: Stable cardiomegaly and left ventricular prominence. Left-sided pacemaker/AICD in place. Mild left basilar opacity could reflect overlying soft tissue, atelectasis, or infiltrate. The right lung is clear. No sizable pleural effusion or pneumothorax. Surgical clips overlying the right breast. No acute osseous abnormality. IMPRESSION: Stable cardiomegaly. Mild left basilar opacity could reflect overlying soft tissue, atelectasis, or infiltrate. Electronically Signed   By: Mannie Seek M.D.   On: 04/05/2024 15:52     Subjective: - no chest pain, shortness of breath, no abdominal pain, nausea or vomiting.   Discharge Exam: BP 108/69 (BP Location: Left Wrist)   Pulse 81   Temp 97.7 F (36.5 C) (Oral)   Resp 17   Ht 5\' 1"  (1.549 m)   Wt 119.5 kg   SpO2 100%   BMI 49.77 kg/m   General: Pt is alert, awake, not in acute distress Cardiovascular: RRR, S1/S2 +, no rubs, no gallops Respiratory: CTA bilaterally, no wheezing, no rhonchi Abdominal: Soft, NT, ND, bowel sounds + Extremities: no edema, no cyanosis    The results of significant diagnostics from  this hospitalization (including imaging, microbiology, ancillary and laboratory) are listed below for reference.     Microbiology: No results found for this or any previous visit (from the past 240 hours).   Labs: Basic Metabolic Panel: Recent Labs  Lab 04/05/24 1631 04/06/24 0455 04/06/24 1849 04/08/24 1035  NA 141 135  --  137  K 3.8 2.9* 4.4 4.3  CL 100 102  --  104  CO2 27 24  --  26  GLUCOSE 116* 89  --  119*  BUN 14 14  --  16  CREATININE 1.45* 1.09*  --  0.89  CALCIUM  9.2 7.3*  --  8.8*  MG 1.8  --   --  2.1   Liver Function Tests: Recent Labs  Lab 04/05/24 1631  AST 24  ALT 11  ALKPHOS 79  BILITOT 0.8  PROT 7.1  ALBUMIN 3.2*   CBC: Recent Labs  Lab 04/05/24 1631  WBC 7.7  NEUTROABS 5.4  HGB 14.2  HCT 43.1  MCV 94.1  PLT 132*   CBG: No results for input(s): "GLUCAP" in the last 168 hours. Hgb A1c No results for input(s): "HGBA1C" in the last 72 hours. Lipid Profile No results for input(s): "CHOL", "HDL", "LDLCALC", "TRIG", "CHOLHDL", "LDLDIRECT" in the last 72 hours. Thyroid  function studies Recent Labs    04/05/24 2010  TSH 1.968   Urinalysis    Component Value Date/Time   COLORURINE YELLOW 09/14/2011 1215   APPEARANCEUR CLEAR 09/14/2011 1215   LABSPEC 1.013 09/14/2011 1215   PHURINE 5.0 09/14/2011 1215   GLUCOSEU NEGATIVE 09/14/2011 1215   HGBUR NEGATIVE 09/14/2011 1215   BILIRUBINUR NEGATIVE 09/14/2011 1215   KETONESUR NEGATIVE 09/14/2011 1215   PROTEINUR NEGATIVE 09/14/2011 1215   UROBILINOGEN 0.2 09/14/2011 1215   NITRITE NEGATIVE 09/14/2011 1215   LEUKOCYTESUR NEGATIVE 09/14/2011 1215    FURTHER DISCHARGE INSTRUCTIONS:   Get Medicines reviewed and adjusted: Please take all your  medications with you for your next visit with your Primary MD   Laboratory/radiological data: Please request your Primary MD to go over all hospital tests and procedure/radiological results at the follow up, please ask your Primary MD to get all  Hospital records sent to his/her office.   In some cases, they will be blood work, cultures and biopsy results pending at the time of your discharge. Please request that your primary care M.D. goes through all the records of your hospital data and follows up on these results.   Also Note the following: If you experience worsening of your admission symptoms, develop shortness of breath, life threatening emergency, suicidal or homicidal thoughts you must seek medical attention immediately by calling 911 or calling your MD immediately  if symptoms less severe.   You must read complete instructions/literature along with all the possible adverse reactions/side effects for all the Medicines you take and that have been prescribed to you. Take any new Medicines after you have completely understood and accpet all the possible adverse reactions/side effects.    Do not drive when taking Pain medications or sleeping medications (Benzodaizepines)   Do not take more than prescribed Pain, Sleep and Anxiety Medications. It is not advisable to combine anxiety,sleep and pain medications without talking with your primary care practitioner   Special Instructions: If you have smoked or chewed Tobacco  in the last 2 yrs please stop smoking, stop any regular Alcohol  and or any Recreational drug use.   Wear Seat belts while driving.   Please note: You were cared for by a hospitalist during your hospital stay. Once you are discharged, your primary care physician will handle any further medical issues. Please note that NO REFILLS for any discharge medications will be authorized once you are discharged, as it is imperative that you return to your primary care physician (or establish a relationship with a primary care physician if you do not have one) for your post hospital discharge needs so that they can reassess your need for medications and monitor your lab values.  Time coordinating discharge: 35  minutes  SIGNED:  Kathlen Para, MD, PhD 04/08/2024, 1:47 PM

## 2024-04-09 ENCOUNTER — Other Ambulatory Visit (HOSPITAL_COMMUNITY): Payer: Self-pay | Admitting: *Deleted

## 2024-04-09 MED ORDER — SPIRONOLACTONE 25 MG PO TABS
12.5000 mg | ORAL_TABLET | Freq: Every day | ORAL | 3 refills | Status: DC
Start: 2024-04-09 — End: 2024-05-18

## 2024-04-10 ENCOUNTER — Other Ambulatory Visit: Payer: Self-pay

## 2024-04-10 ENCOUNTER — Encounter (HOSPITAL_COMMUNITY): Payer: Self-pay

## 2024-04-10 ENCOUNTER — Emergency Department (HOSPITAL_COMMUNITY)

## 2024-04-10 ENCOUNTER — Inpatient Hospital Stay (HOSPITAL_COMMUNITY)
Admission: EM | Admit: 2024-04-10 | Discharge: 2024-04-12 | DRG: 309 | Disposition: A | Discharge status: Home / Self Care | Attending: Cardiology | Admitting: Cardiology

## 2024-04-10 ENCOUNTER — Telehealth: Payer: Self-pay

## 2024-04-10 DIAGNOSIS — I5020 Unspecified systolic (congestive) heart failure: Secondary | ICD-10-CM | POA: Diagnosis not present

## 2024-04-10 DIAGNOSIS — Z6841 Body Mass Index (BMI) 40.0 and over, adult: Secondary | ICD-10-CM

## 2024-04-10 DIAGNOSIS — I110 Hypertensive heart disease with heart failure: Secondary | ICD-10-CM | POA: Diagnosis present

## 2024-04-10 DIAGNOSIS — Z4502 Encounter for adjustment and management of automatic implantable cardiac defibrillator: Secondary | ICD-10-CM | POA: Diagnosis not present

## 2024-04-10 DIAGNOSIS — I480 Paroxysmal atrial fibrillation: Secondary | ICD-10-CM | POA: Diagnosis present

## 2024-04-10 DIAGNOSIS — E669 Obesity, unspecified: Secondary | ICD-10-CM | POA: Diagnosis present

## 2024-04-10 DIAGNOSIS — M7989 Other specified soft tissue disorders: Secondary | ICD-10-CM | POA: Diagnosis present

## 2024-04-10 DIAGNOSIS — I5022 Chronic systolic (congestive) heart failure: Secondary | ICD-10-CM | POA: Diagnosis not present

## 2024-04-10 DIAGNOSIS — I4891 Unspecified atrial fibrillation: Secondary | ICD-10-CM | POA: Diagnosis not present

## 2024-04-10 DIAGNOSIS — H409 Unspecified glaucoma: Secondary | ICD-10-CM | POA: Diagnosis present

## 2024-04-10 DIAGNOSIS — I951 Orthostatic hypotension: Secondary | ICD-10-CM | POA: Diagnosis present

## 2024-04-10 DIAGNOSIS — I472 Ventricular tachycardia, unspecified: Secondary | ICD-10-CM | POA: Diagnosis present

## 2024-04-10 DIAGNOSIS — Z79890 Hormone replacement therapy (postmenopausal): Secondary | ICD-10-CM

## 2024-04-10 DIAGNOSIS — Z79899 Other long term (current) drug therapy: Secondary | ICD-10-CM

## 2024-04-10 DIAGNOSIS — Z83511 Family history of glaucoma: Secondary | ICD-10-CM

## 2024-04-10 DIAGNOSIS — E785 Hyperlipidemia, unspecified: Secondary | ICD-10-CM | POA: Diagnosis present

## 2024-04-10 DIAGNOSIS — Z888 Allergy status to other drugs, medicaments and biological substances status: Secondary | ICD-10-CM

## 2024-04-10 DIAGNOSIS — Z91048 Other nonmedicinal substance allergy status: Secondary | ICD-10-CM

## 2024-04-10 DIAGNOSIS — I4901 Ventricular fibrillation: Secondary | ICD-10-CM | POA: Diagnosis not present

## 2024-04-10 DIAGNOSIS — R7989 Other specified abnormal findings of blood chemistry: Secondary | ICD-10-CM

## 2024-04-10 DIAGNOSIS — Z8619 Personal history of other infectious and parasitic diseases: Secondary | ICD-10-CM

## 2024-04-10 DIAGNOSIS — Z886 Allergy status to analgesic agent status: Secondary | ICD-10-CM

## 2024-04-10 DIAGNOSIS — Z95810 Presence of automatic (implantable) cardiac defibrillator: Secondary | ICD-10-CM

## 2024-04-10 DIAGNOSIS — Z853 Personal history of malignant neoplasm of breast: Secondary | ICD-10-CM

## 2024-04-10 DIAGNOSIS — Z8249 Family history of ischemic heart disease and other diseases of the circulatory system: Secondary | ICD-10-CM

## 2024-04-10 DIAGNOSIS — I428 Other cardiomyopathies: Secondary | ICD-10-CM | POA: Diagnosis present

## 2024-04-10 DIAGNOSIS — Z7901 Long term (current) use of anticoagulants: Secondary | ICD-10-CM

## 2024-04-10 LAB — BASIC METABOLIC PANEL WITH GFR
Anion gap: 12 (ref 5–15)
BUN: 24 mg/dL — ABNORMAL HIGH (ref 8–23)
CO2: 23 mmol/L (ref 22–32)
Calcium: 9.5 mg/dL (ref 8.9–10.3)
Chloride: 102 mmol/L (ref 98–111)
Creatinine, Ser: 1.09 mg/dL — ABNORMAL HIGH (ref 0.44–1.00)
GFR, Estimated: 54 mL/min — ABNORMAL LOW (ref >60)
Glucose, Bld: 93 mg/dL (ref 70–99)
Potassium: 4.6 mmol/L (ref 3.5–5.1)
Sodium: 137 mmol/L (ref 135–145)

## 2024-04-10 LAB — CBC
HCT: 40.6 % (ref 36.0–46.0)
Hemoglobin: 13.4 g/dL (ref 12.0–15.0)
MCH: 31.3 pg (ref 26.0–34.0)
MCHC: 33 g/dL (ref 30.0–36.0)
MCV: 94.9 fL (ref 80.0–100.0)
Platelets: 130 10*3/uL — ABNORMAL LOW (ref 150–400)
RBC: 4.28 MIL/uL (ref 3.87–5.11)
RDW: 15.1 % (ref 11.5–15.5)
WBC: 6.5 10*3/uL (ref 4.0–10.5)
nRBC: 0 % (ref 0.0–0.2)

## 2024-04-10 LAB — TROPONIN I (HIGH SENSITIVITY)
Troponin I (High Sensitivity): 208 ng/L
Troponin I (High Sensitivity): 209 ng/L (ref <18)
Troponin I (High Sensitivity): 178 ng/L (ref <18)

## 2024-04-10 LAB — BRAIN NATRIURETIC PEPTIDE: B Natriuretic Peptide: 314.1 pg/mL — ABNORMAL HIGH (ref 0.0–100.0)

## 2024-04-10 LAB — D-DIMER, QUANTITATIVE: D-Dimer, Quant: 4.32 ug{FEU}/mL — ABNORMAL HIGH (ref 0.00–0.50)

## 2024-04-10 MED ORDER — APIXABAN 5 MG PO TABS
5.0000 mg | ORAL_TABLET | Freq: Two times a day (BID) | ORAL | Status: DC
  Administered 2024-04-11 – 2024-04-12 (×4): 5 mg via ORAL
  Filled 2024-04-10 (×4): qty 1

## 2024-04-10 MED ORDER — AMIODARONE HCL IN DEXTROSE 360-4.14 MG/200ML-% IV SOLN
60.0000 mg/h | INTRAVENOUS | Status: AC
  Administered 2024-04-10 (×2): 60 mg/h via INTRAVENOUS
  Filled 2024-04-10: qty 200

## 2024-04-10 MED ORDER — AMIODARONE HCL IN DEXTROSE 360-4.14 MG/200ML-% IV SOLN: 30.0000 mg/h | INTRAVENOUS | Status: DC

## 2024-04-10 MED ORDER — ACETAMINOPHEN 500 MG PO TABS
1000.0000 mg | ORAL_TABLET | Freq: Three times a day (TID) | ORAL | Status: DC | PRN
  Administered 2024-04-10 – 2024-04-12 (×3): 1000 mg via ORAL
  Filled 2024-04-10 (×3): qty 2

## 2024-04-10 MED ORDER — AMIODARONE LOAD VIA INFUSION
150.0000 mg | Freq: Once | INTRAVENOUS | Status: AC
  Administered 2024-04-10: 150 mg via INTRAVENOUS
  Filled 2024-04-10: qty 83.34

## 2024-04-10 MED ORDER — IOHEXOL 350 MG/ML SOLN
75.0000 mL | Freq: Once | INTRAVENOUS | Status: AC | PRN
  Administered 2024-04-10: 75 mL via INTRAVENOUS

## 2024-04-10 MED ORDER — AMIODARONE LOAD VIA INFUSION
150.0000 mg | Freq: Once | INTRAVENOUS | Status: DC
  Filled 2024-04-10: qty 83.34

## 2024-04-10 MED ORDER — AMIODARONE HCL 200 MG PO TABS: 400.0000 mg | ORAL_TABLET | Freq: Two times a day (BID) | ORAL | Status: DC

## 2024-04-10 MED ORDER — AMIODARONE HCL IN DEXTROSE 360-4.14 MG/200ML-% IV SOLN: 60.0000 mg/h | INTRAVENOUS | Status: DC

## 2024-04-10 MED ORDER — AMIODARONE HCL IN DEXTROSE 360-4.14 MG/200ML-% IV SOLN
30.0000 mg/h | INTRAVENOUS | Status: AC
  Administered 2024-04-11: 30 mg/h via INTRAVENOUS
  Filled 2024-04-10 (×2): qty 200

## 2024-04-10 NOTE — ED Notes (Signed)
 Patient assisted to [facility].

## 2024-04-10 NOTE — H&P (Addendum)
 ELECTROPHYSIOLOGY H&P NOTE    Patient ID: [name] MRN: [id number], DOB/AGE: [birth year] 73 y.o.  Admit date: 04/10/2024 Date of Consult: 04/10/2024  Primary Physician: Abbe Hoard., Davis Primary Cardiologist: None  Electrophysiologist: Dr. Lawana Pray   Referring Provider: Dr. Daivd Dub  Patient Profile: [name] is a 73 y.o. female with a history of NICM, HFrEF s/p CRT-D , parox afib, HTN, HLD, DVT, type b aortic dissection, breast Ca s/p lumpectomy, XRT, chemo, who is being seen today for the evaluation of VT with ICD firing at the request of who is being seen today at the request of Dr. Daivd Dub.  HPI:  [name] is a 73 y.o. female with PMH as above. She was recently diagnosed with AFib 04/2023 at which time she was started on eliquis  BID.  She was recently hospitalized 6/7-07/2024 for multiple episodes of near syncope. NO arrhythmias on device interrogation.  Orthostatics were positive, and GDMT was initially held and later reduced with improvement.   Device clinic was alerted earlier this morning for HV therapy x 3 that occurred on 6/10.   On interview, she reports feeling well and was not aware she had been shocked at all by her device. She denies chest pain, chest pressure, palpitation. She has had increased lower extremity swelling, specifically on L side, no weeping. She has noticed decreased exercise capacity for about the last year, but seems worse lately over the past few months. 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   In ER, labs notable for BNP 314,  trop flat at 208 > 209, d-dimer 4.32. She reported having dry cough with mild SOB x couple days, so CTA-PE was ordered which was negative for PE.   Labs Potassium4.6 (06/11 1153) Magnesium   2.1 (06/09 1035) Creatinine, ser  1.09* (06/11 1153) PLT  130* (06/11 1153) HGB  13.4 (06/11 1153) WBC 6.5 (06/11 1153) Troponin I (High  Sensitivity)209* (06/11 1336).    Past Medical History:  Diagnosis Date   Aortic dissection (HCC) 12/2008   Type B   Arthritis    lower back   Atrial fibrillation (HCC)    post op (left thoracotomy) 11/12 req amio/DCCV   Breast cancer (HCC) 07/2009   Stage 3 lumpectomy, radiation, chemotherapy; finished chemo October, felt to be in remission   Cardiac defibrillator in place    St. Jude (JA) 9/12; LV lead placement unsuccessful;  s/p left thoracotomy with placement of epicardial LV lead 09/2011 (Dr. PVT)   CHF (congestive heart failure) (HCC)    Chronic systolic dysfunction of left ventricle    Depression    DVT (deep venous thrombosis) (HCC) 10/18/2010   GERD (gastroesophageal reflux disease)    Glaucoma    Hiatal hernia    small hiatal hernia   History of shingles    Hyperlipidemia    Hypertension    Left bundle branch block    Nonischemic cardiomyopathy (HCC)    Admission 12/11 with EF 25%, normal coronary arteries by cath 12/11; NICM presumed to be secondary to chemotherapy for breast cancer   Obesity    [name]    with exertion     Surgical History:  Past Surgical History:  Procedure Laterality Date   BIV ICD GENERATOR CHANGEOUT N/A 05/22/2023   Procedure: BIV ICD GENERATOR CHANGEOUT;  Surgeon: Lei Pump, Davis;  Location: [facility] INVASIVE CV LAB;  Service: Cardiovascular;  Laterality: N/A;  BREAST LUMPECTOMY     Right breast   CARDIAC CATHETERIZATION     2011   CARDIAC DEFIBRILLATOR PLACEMENT     EP IMPLANTABLE DEVICE N/A 01/12/2016   STJ ICD Unify Assura gen change, Dr. Nunzio Belch   OOPHORECTOMY     Single   THORACOTOMY  09/16/2011   Procedure: THORACOTOMY MAJOR;  Surgeon: Shawn Delay, Davis;  Location: [facility] OR;  Service: Thoracic;  Laterality: Left;  left anterolateral Thoracotomy for placement of St. Jude epicardial pacing lead    TONSILLECTOMY     TUBAL LIGATION     Bilateral     (Not in a hospital admission)   Inpatient Medications:    Allergies:  Allergies  Allergen Reactions   Alphagan [Brimonidine]     Irritation    Mobic [Meloxicam] Other (See Comments)    Cannot take due to heart issue   Nsaids Other (See Comments)    Cannot take due to heart issue   Polyvinyl Alcohol Other (See Comments)    Travatan and Alphagan(irritation)   Statins Other (See Comments)    Per pt her cardiologist told her not to take   Tolmetin Other (See Comments)    Can not take due to heart issue   Travatan Z [Travoprost (Bak Free)]     Irritation   Tape Rash    Uncoded Allergy. Allergen: Tape Blister from clear tapes.    Family History  Problem Relation Age of Onset   Hypertension Mother    Glaucoma Mother    Atrial fibrillation Mother    Hypertension Father    Hypertension Brother    Hypertension Maternal Grandfather    Heart attack Maternal Grandfather        MI   Coronary artery disease Maternal Grandfather    Hypertension Paternal Grandfather    Heart attack Paternal Grandfather        MI   Coronary artery disease Paternal Grandfather    Hypertension Brother      Physical Exam: Vitals:   04/10/24 1150 04/10/24 1410 04/10/24 1505 04/10/24 1515  BP:   (!) 70/53 91/64  Pulse:  67 64 69  Resp:  15 15 16   Temp:      SpO2:  99% 99% 98%  Weight: 118.4 kg     Height: 5' 1 (1.549 m)       GEN- obese female laying in bed. NAD, A&O x 3, normal affect HEENT: Normocephalic, atraumatic Lungs- CTAB, Normal effort.  Heart- Regular rate and rhythm with ectopy, No M/G/R.  GI- Soft, NT, ND.  Extremities- No clubbing, cyanosis. Lower extremities are soft, do not appreciate edema though very difficult to assess d/t body habitus   Radiology/Studies: DG Chest 2 View Result Date: 04/10/2024 CLINICAL DATA:  Defibrillator malfunction. EXAM: CHEST - 2 VIEW COMPARISON:  April 05, 2024. FINDINGS: Stable cardiomegaly. Left-sided defibrillator is unchanged. Lungs are clear. Bony thorax is unremarkable. IMPRESSION: No active  cardiopulmonary disease. Electronically Signed   By: Rosalene Colon M.D.   On: 04/10/2024 12:51   ECHOCARDIOGRAM COMPLETE Result Date: 04/06/2024    ECHOCARDIOGRAM REPORT   Patient Name:   [name] Date of Exam: 04/06/2024 Medical Rec #:  [id number]      Height:       62.0 in Accession #:    [id number]     Weight:       261.0 lb Date of Birth:  January 02, 1951      BSA:  2.141 m Patient Age:    73 years       BP:           126/82 mmHg Patient Gender: F              HR:           80 bpm. Exam Location:  Inpatient Procedure: 2D Echo, Cardiac Doppler, Color Doppler and Intracardiac            Opacification Agent (Both Spectral and Color Flow Doppler were            utilized during procedure). Indications:    Near Syncope  History:        Patient has prior history of Echocardiogram examinations, most                 recent 09/01/2022. CHF and Nonischemic Cardiomyopathy, Aortic                 Dissection and DVT, Arrythmias:Atrial Fibrillation,                 Signs/Symptoms:[name]; Risk Factors:Dyslipidemia                 and Hypertension.  Sonographer:    Travis Friedman RDCS Referring Phys: [id number] Juliette Oh  Sonographer Comments: Patient is obese. IMPRESSIONS  1. No mural apical thrombus with definity . Left ventricular ejection fraction, by estimation, is <20%. The left ventricle has severely decreased function. The left ventricle demonstrates global hypokinesis. The left ventricular internal cavity size was severely dilated. Left ventricular diastolic parameters are indeterminate.  2. Device lead in RV. Right ventricular systolic function is normal. The right ventricular size is normal.  3. Left atrial size was moderately dilated.  4. Functional MR from LV dysfunction and reduced posterior leaflet motion apically displaced coaptation point. The mitral valve is abnormal. Mild to moderate mitral valve regurgitation. No evidence of mitral stenosis.  5. Mean gradient 4 peak 9 mmHg likely mild low  flow AS . The aortic valve is tricuspid. There is mild calcification of the aortic valve. There is mild thickening of the aortic valve. Aortic valve regurgitation is not visualized. Aortic valve sclerosis is present, with no evidence of aortic valve stenosis.  6. The inferior vena cava is normal in size with greater than 50% respiratory variability, suggesting right atrial pressure of 3 mmHg. FINDINGS  Left Ventricle: No mural apical thrombus with definity . Left ventricular ejection fraction, by estimation, is <20%. The left ventricle has severely decreased function. The left ventricle demonstrates global hypokinesis. Strain was performed and the global longitudinal strain is indeterminate. The left ventricular internal cavity size was severely dilated. There is no left ventricular hypertrophy. Left ventricular diastolic parameters are indeterminate. Right Ventricle: Device lead in RV. The right ventricular size is normal. No increase in right ventricular wall thickness. Right ventricular systolic function is normal. Left Atrium: Left atrial size was moderately dilated. Right Atrium: Right atrial size was normal in size. Pericardium: There is no evidence of pericardial effusion. Mitral Valve: Functional MR from LV dysfunction and reduced posterior leaflet motion apically displaced coaptation point. The mitral valve is abnormal. There is mild thickening of the mitral valve leaflet(s). There is mild calcification of the mitral valve leaflet(s). Mild to moderate mitral valve regurgitation. No evidence of mitral valve stenosis. Tricuspid Valve: The tricuspid valve is normal in structure. Tricuspid valve regurgitation is mild . No evidence of tricuspid stenosis. Aortic Valve: Mean gradient 4 peak 9 mmHg  likely mild low flow AS. The aortic valve is tricuspid. There is mild calcification of the aortic valve. There is mild thickening of the aortic valve. Aortic valve regurgitation is not visualized. Aortic valve sclerosis  is present, with no evidence of aortic valve stenosis. Aortic valve mean gradient measures 4.0 mmHg. Aortic valve peak gradient measures 8.5 mmHg. Aortic valve area, by VTI measures 1.14 cm. Pulmonic Valve: The pulmonic valve was normal in structure. Pulmonic valve regurgitation is not visualized. No evidence of pulmonic stenosis. Aorta: The aortic root is normal in size and structure. Venous: The inferior vena cava is normal in size with greater than 50% respiratory variability, suggesting right atrial pressure of 3 mmHg. IAS/Shunts: No atrial level shunt detected by color flow Doppler. Additional Comments: 3D was performed not requiring image post processing on an independent workstation and was indeterminate.  LEFT VENTRICLE PLAX 2D LVIDd:         7.30 cm LVIDs:         6.75 cm LV PW:         0.90 cm LV IVS:        1.05 cm LVOT diam:     2.40 cm LV SV:         36 LV SV Index:   17 LVOT Area:     4.52 cm  LV Volumes (MOD) LV vol d, MOD A2C: 515.0 ml LV vol d, MOD A4C: 519.0 ml LV vol s, MOD A2C: 430.0 ml LV vol s, MOD A4C: 430.0 ml LV SV MOD A2C:     85.0 ml LV SV MOD A4C:     519.0 ml LV SV MOD BP:      82.1 ml RIGHT VENTRICLE          IVC RV Basal diam:  3.50 cm  IVC diam: 2.10 cm TAPSE (M-mode): 1.1 cm LEFT ATRIUM              Index        RIGHT ATRIUM           Index LA diam:        4.30 cm  2.01 cm/m   RA Area:     10.80 cm LA Vol (A2C):   85.9 ml  40.13 ml/m  RA Volume:   20.50 ml  9.58 ml/m LA Vol (A4C):   121.0 ml 56.52 ml/m LA Biplane Vol: 102.0 ml 47.65 ml/m  AORTIC VALVE AV Area (Vmax):    1.50 cm AV Area (Vmean):   1.50 cm AV Area (VTI):     1.14 cm AV Vmax:           146.00 cm/s AV Vmean:          96.700 cm/s AV VTI:            0.315 m AV Peak Grad:      8.5 mmHg AV Mean Grad:      4.0 mmHg LVOT Vmax:         48.50 cm/s LVOT Vmean:        32.000 cm/s LVOT VTI:          0.080 m LVOT/AV VTI ratio: 0.25  AORTA Ao Root diam: 3.50 cm Ao Asc diam:  2.90 cm  SHUNTS Systemic VTI:  0.08 m Systemic  Diam: 2.40 cm [name] Electronically signed by [name] Signature Date/Time: 04/06/2024/12:27:47 PM    Final    DG Chest Port 1 View Result Date: 04/05/2024 CLINICAL DATA:  [name]. EXAM: PORTABLE CHEST 1 VIEW COMPARISON:  01/10/2019. FINDINGS: Stable cardiomegaly and left ventricular prominence. Left-sided pacemaker/AICD in place. Mild left basilar opacity could reflect overlying soft tissue, atelectasis, or infiltrate. The right lung is clear. No sizable pleural effusion or pneumothorax. Surgical clips overlying the right breast. No acute osseous abnormality. IMPRESSION: Stable cardiomegaly. Mild left basilar opacity could reflect overlying soft tissue, atelectasis, or infiltrate. Electronically Signed   By: Mannie Seek M.D.   On: 04/05/2024 15:52    EKG: 04/10/2024 at 1148 - AS-VP at 66 QRS (personally reviewed)  TELEMETRY: AS-VP with frequent PVC (personally reviewed)  DEVICE HISTORY:  St. Jude CRT-D, imp 07/2011  LV lead added 10/2011; Gen change 05/2023  VF episode 1/1 04/09/2024 at 530am Appears atrial fibrillation with irregular ventricular cycle length, varying 320- Device attempts ATP that is accelerates ventricular rate into VT > 25 J shock that is ineffective AFib continues with further deterioration of ventricular rate to VT at > 36J shock that is also ineffective Afib and VT continue > 40J shock that terminates event  Returns to AP-BiVP   Assessment/Plan: #) AFib w RVR #) HFrEF, thought d/t chemotherapy #) St. Jude dual chamber ICD #) ICD firing #) HTN #) orthostatic hypotension  Looking at device interrogation, it appears that patient's initial arrhythmia was AFib w RVR falling into VT zone. As the ICD treated the arrhythmia, it devolved into VT She does not appear hypervolemic on exam Will load with amio IV, if afib quiescent anticipate dc tomorrow Continue home GDMT at previous doses.   Continue 5mg  eliquis  for stroke ppx      For questions or updates, please contact [facility] Please consult [email or web address] for contact info under Cardiology/STEMI.  Signed, Suzann Riddle, NP  04/10/2024 4:16 PM  I have seen, examined the patient, and reviewed the above assessment and plan.    HPI: [name] is a 73 year old female with a past medical history notable for systolic heart failure secondary to nonischemic cardiomyopathy status post CRT-D, paroxysmal atrial fibrillation, hypertension, hyperlipidemia, ype b aortic dissection, breast Ca s/p lumpectomy, XRT, chemo who was instructed to present to ED today for evaluation by the device clinic.  Device clinic received alert this morning that patient had received multiple device shocks overnight.  Patient reports that she was sleeping during these episodes and does not recall being shocked by her device.  Patient had just been discharged from hospital on 04/08/2024.  She reports fatigue but otherwise no significant changes from baseline.  More recently, she began having paroxysms of atrial fibrillation which correlated with symptoms of dizziness.  Today, she reports feeling relatively well with no new or acute complaints.  General: Well developed, in no acute distress.  Neck: No JVD.  Cardiac: Normal rate, regular rhythm.  Resp: Normal work of breathing.  Ext: No edema.  Neuro: No gross focal deficits.  Psych: Normal affect.   Assessment and Plan: [name] has a history of chronic systolic heart failure secondary to nonischemic cardiomyopathy status post CRT-D who began having increased burden of atrial fibrillation of late.  Overnight, it appears that she went into atrial fibrillation with rapid ventricular rates, which fell into the treatment zone of her defibrillator.  It is difficult to discern whether she remains in rapid AF or develops dual tachycardia, but she received ATP followed by a 25 J shock.  Post ATP she more convincingly appears to be in ventricular  tachycardia which does not  convert after the 25 J shock.  She then receives a 52 J shock which also does not convert her tachycardia, although there is a morphology change of the ventricular tachycardia.  She finally received a 40 J shock which successfully converts both ventricular tachycardia and atrial fibrillation to a AP-BiV rhythm. She does not appear overtly volume overloaded to suggest heart failure as etiology for AF or VT.  This is likely just progression of her atrial fibrillation in setting of severe cardiomyopathy, with either atrial fibrillation and inappropriate device therapy initially or dual tachycardia from the onset.  Given her degree of systolic dysfunction, antiarrhythmic drug options for her atrial fibrillation are not limited to Tikosyn or amiodarone .  In the presence of ventricular tachycardia, amiodarone  would likely be the better option.  Given her BMI and severe cardiomyopathy, she is not currently a candidate for catheter ablation.  #. Multiple ICD shocks #. Paroxymal atrial fibrillation with RVR #. Ventricular tachycardia - 24-hour IV amiodarone  load with close monitoring on telemetry for recurrence of arrhythmias.  If electrically quiescent then transition to oral amiodarone  tomorrow. -Continue Eliquis  5 mg twice daily.  #.  Chronic systolic heart failure: Reasonably well compensated on exam. -Continue home GDMT regimen. -We will attempt to optimize BiV paced morphology prior to discharge.   Ardeen Kohler, Davis 04/10/2024 9:22 PM

## 2024-04-10 NOTE — ED Notes (Signed)
 Date and time results received: 04/10/24 1:16 PM  (use smartphrase .now to insert current time)  Test: Trop Critical Value: 208  Name of Provider Notified: Dr. Adrain Alar  Orders Received? Or Actions Taken?: see chart

## 2024-04-10 NOTE — ED Triage Notes (Signed)
 Pt states her defibrillator went off 3 times last night. Pt states she did not feel it go off and doctor called her to go to ER. Denies chest pain.

## 2024-04-10 NOTE — Progress Notes (Signed)
 EPIC Encounter for ICM Monitoring  Patient Name: [name] is a 73 y.o. female Date: 04/10/2024 Primary Care Physican: Brittney Hoard., MD Primary Cardiologist: [name] Electrophysiologist: [name] Bi-V Pacing: 92%           05/03/2023 Weight: 281.6 lbs 08/23/2023 Office Weight: 273 lbs 11/01/2022 Weight:  271 lbs 01/10/2024 Weight: 269 lbs 02/13/2024 Weight: 261 lbs 03/19/2024 Office Weight: 258 lbs 04/05/2024 Office Weight: 262 lbs   AT/AF Burden <1% (Eliquis  started 04/05/2024)         Pt currently in ED as instructed by device clinic nurse for ICD shock that occurred last night.  Per phone note patient only felt 1 shock.   Hospitalization from 6/6-6/9 for near syncope   Corvue thoracic impedance suggesting possible fluid accumulation from 6/1-6/7 (hospitalization 6/6-6/9) and returned to normal 6/8.    Alert noted .  Message sent to Device Clinic Triage 6/11 for review.    Prescribed:    6/9 Furosemide  stopped at hospital discharge Spironolactone  25 mg take 0.5 tablet (12.5 mg total) by mouth daily.       Labs: 04/08/2024 Creatinine 0.89, BUN 16, Potassium 4.3, Sodium 137, GFR >60  04/06/2024 Potassium 4.4 (6:49 PM) 04/06/2024 Creatinine 1.09, BUN 14, Potassium 2.9, Sodium 135, GFR 54 (4:55 AM) 04/05/2024 Creatinine 1.45, BUN 14, Potassium 3.8, Sodium 141, GFR 38 03/19/2024 Creatinine 1.1,   BUN 22, Potassium 4.4, Sodium 138, GFR 53  12/21/2023 Creatinine 1.11, BUN 15, Potassium 4.1, Sodium 139, GFR 52 A complete set of results can be found in Results Review.   Recommendations: None, pt currently in ED on 6/11.     Follow-up plan: ICM clinic phone appointment on 05/22/2024.  91 day device clinic remote transmission 05/21/2024.    EP/Cardiology Office Visits:  07/22/2024 with HF clinic.   Recall 08/17/2024 with Dr [name].     Copy of ICM check sent to Dr. Lawana Davis.   3 month ICM trend: 04/10/2024.    12-14 Month ICM trend:     Brittney Jain, RN 04/10/2024 8:59 AM

## 2024-04-10 NOTE — Telephone Encounter (Signed)
 Alert received from [employer] for HV therapy delivered, this is a scheduled HF transmission. Event occurred 6/10 @05 :30, duration 38sec, HR 244, EGM c/w AF with sustained VT vs RVR, ATP x1 followed by 25J HV therapy  followed by VF, HV therapy 36J without resolution, 40J delivered converting to AS/BiV pace - route to triage Known AF, Eliquis  per EPIC, pt was discharged from the hospital 6/9.   Patient reports feeling well since D/C from hospital. Patient states she had her eyes closed and felt 1 shock, although was not aware of 3 shocks.   Patient advised to go to ER for evaluation considering ICD shock x3. Advised no driving, have someone drive her or call 147. Patient voiced understanding and agreeable to plan.

## 2024-04-10 NOTE — ED Provider Notes (Signed)
 [place] [facility] Provider Note   CSN: [id number] Arrival date & time: 04/10/24  1132     History  Chief Complaint  Patient presents with   AICD Problem    SYVANNA CIOLINO is a 73 y.o. female.  HPI   73 year old female with medical history significant for HLD, HTN, DVT, type B aortic dissection, atrial fibrillation on Eliquis , nonischemic cardiomyopathy and CHF with AICD in place who presents to the emergency department after being notified that her ICD has fired 3 times in the last 24 hours.  The patient states that she was just recently hospitalized and on chart review she was discharged on 04/08/2024 after an inpatient observation period for a near syncopal event.  Cardiology had been consulted and evaluated the patient and had been recommended to hold her furosemide  and Coreg  and continue her other home medications and cut her spironolactone  in half.  Her most recent 2D echocardiogram on admission showed an EF of less than 20%.  She states that she has been completely have symptomatic over the past few days.  She denies any chest pain.  She states that she has had a dry cough and mild shortness of breath for the past few days.  She was called and told that her defibrillator went off 3 times last night.  She was advised to present to the emergency department for further evaluation.  She has been compliant with her home Eliquis .  Home Medications Prior to Admission medications   Medication Sig Start Date End Date Taking? Authorizing Provider  allopurinol  (ZYLOPRIM ) 300 MG tablet Take 300 mg by mouth daily.    [provider]  apixaban  (ELIQUIS ) 5 MG TABS tablet Take 1 tablet (5 mg total) by mouth 2 (two) times daily. Patient not taking: Reported on 04/05/2024 04/05/24   Nathanel Bal, PA-C  bimatoprost  (LUMIGAN ) 0.03 % ophthalmic solution Place 1 drop into both eyes at bedtime. Reported on 01/12/2016    [provider]  Calcium   Carbonate-Vitamin D (CALCIUM -VITAMIN D3) 600-200 MG-UNIT TABS Take 1 tablet by mouth 2 (two) times daily.    [provider]  cetirizine (ZYRTEC) 10 MG tablet Take 10 mg by mouth at bedtime. 04/08/19   [provider]  Cholecalciferol (VITAMIN D) 125 MCG (5000 UT) CAPS Take 5,000 Units by mouth daily.    [provider]  clotrimazole-betamethasone (LOTRISONE) cream Apply 1 Application topically 2 (two) times daily as needed (Rash).    [provider]  COLCRYS  0.6 MG tablet TAKE ONE TABLET BY MOUTH EVERY DAY Patient not taking: Reported on 04/05/2024 07/03/18   Nicholas Bari, MD  cyanocobalamin (VITAMIN B12) 1000 MCG tablet Take 1,000 mcg by mouth daily.    [provider]  cyclobenzaprine  (FLEXERIL ) 10 MG tablet Take 1 tablet (10 mg total) by mouth 3 (three) times daily as needed for muscle spasms. Patient not taking: Reported on 04/05/2024 12/27/13   Bensimhon, Daniel R, MD  dextromethorphan (DELSYM) 30 MG/5ML liquid Take 5 mLs by mouth daily as needed for cough.    [provider]  diclofenac  sodium (VOLTAREN ) 1 % GEL APPLY THREE GRAMS TO THREE LARGE JOINTS UP TO THREE TIMES DAILY AS NEEDED 04/04/18   Romayne Clubs, PA-C  docusate sodium  (COLACE) 100 MG capsule Take 100 mg by mouth 2 (two) times daily as needed for mild constipation.    [provider]  ENTRESTO  24-26 MG TAKE ONE TABLET BY MOUTH TWICE DAILY 03/15/24  Bensimhon, Rheta Celestine, MD  famotidine  (PEPCID ) 20 MG tablet Take 20 mg by mouth 2 (two) times daily.    [provider]  ipratropium (ATROVENT) 0.03 % nasal spray Place 2 sprays into both nostrils daily as needed for rhinitis.    [provider]  ivabradine  ([name]) 5 MG TABS tablet Take 0.5 tablets (2.5 mg total) by mouth 2 (two) times daily. If hr greater than 70 Patient taking differently: Take 2.5 mg by mouth See admin instructions. Take 1 tablet by mouth twice a day if HR is greater than 70 07/11/23    Bensimhon, Rheta Celestine, MD  levothyroxine (SYNTHROID) 25 MCG tablet Take 25 mcg by mouth daily. 01/15/20   [provider]  montelukast  (SINGULAIR ) 10 MG tablet Take 10 mg by mouth at bedtime. For allergies & congestion    [provider]  Multiple Vitamins-Minerals (OCUVITE PO) Take 5 mg by mouth daily.    [provider]  nystatin (MYCOSTATIN/NYSTOP) powder Apply topically 2 (two) times daily. 04/08/24   Gherghe, Costin M, MD  omeprazole  (PRILOSEC) 40 MG capsule TAKE ONE CAPSULE BY MOUTH TWICE DAILY 08/04/20   Kozlow, Rema Care, MD  PARoxetine  (PAXIL ) 30 MG tablet Take 30 mg by mouth at bedtime. 02/10/16   [provider]  Polyethyl Glycol-Propyl Glycol (SYSTANE OP) Place 1 drop into both eyes daily as needed (dry eyes).    [provider]  rosuvastatin  (CRESTOR ) 5 MG tablet TAKE ONE TABLET BY MOUTH EVERY EVENING 11/07/23   Bensimhon, Rheta Celestine, MD  sodium chloride  (OCEAN) 0.65 % SOLN nasal spray Place 1 spray into both nostrils daily as needed for congestion.    [provider]  sodium fluoride (DENTA 5000 PLUS) 1.1 % CREA dental cream Place 1 Application onto teeth 2 (two) times daily.    [provider]  spironolactone  (ALDACTONE ) 25 MG tablet Take 0.5 tablets (12.5 mg total) by mouth daily. 04/09/24   Bensimhon, Rheta Celestine, MD  traMADol  (ULTRAM ) 50 MG tablet Take 50 mg by mouth every 6 (six) hours as needed for pain.    [provider]  vitamin E 1000 UNIT capsule Take 1,000 Units by mouth daily.    [provider]  ranitidine (ZANTAC) 300 MG capsule Take 300 mg by mouth daily as needed. Acid indigestion  01/24/12  [provider]      Allergies    Alphagan [brimonidine], Mobic [meloxicam], Nsaids, Polyvinyl alcohol, Statins, Tolmetin, Travatan z [travoprost (bak free)], and Tape    Review of Systems   Review of Systems  All other systems reviewed and are negative.   Physical Exam Updated Vital Signs BP 106/84  Comment: Simultaneous filing. User may not have seen previous data.  Pulse 66 Comment: Simultaneous filing. User may not have seen previous data.  Temp (!) 97.5 F (36.4 C)   Resp 17 Comment: Simultaneous filing. User may not have seen previous data.  Ht 5' 1 (1.549 m)   Wt 118.4 kg   SpO2 98% Comment: Simultaneous filing. User may not have seen previous data.  BMI 49.32 kg/m  Physical Exam Vitals and nursing note reviewed.  Constitutional:      General: She is not in acute distress.    Appearance: She is well-developed.  HENT:     Head: Normocephalic and atraumatic.  Eyes:     Conjunctiva/sclera: Conjunctivae normal.  Cardiovascular:     Rate and Rhythm: Normal rate and regular rhythm.     Heart sounds: No murmur heard.  Pulmonary:     Effort: Pulmonary effort is normal. No respiratory distress.     Breath sounds: Normal breath sounds.  Abdominal:     Palpations: Abdomen is soft.     Tenderness: There is no abdominal tenderness.  Musculoskeletal:        General: No swelling.     Cervical back: Neck supple.  Skin:    General: Skin is warm and dry.     Capillary Refill: Capillary refill takes less than 2 seconds.  Neurological:     Mental Status: She is alert.  Psychiatric:        Mood and Affect: Mood normal.     ED Results / Procedures / Treatments   Labs (all labs ordered are listed, but only abnormal results are displayed) Labs Reviewed  BASIC METABOLIC PANEL WITH GFR - Abnormal; Notable for the following components:      Result Value   BUN 24 (*)    Creatinine, Ser 1.09 (*)    GFR, Estimated 54 (*)    All other components within normal limits  CBC - Abnormal; Notable for the following components:   Platelets 130 (*)    All other components within normal limits  BRAIN NATRIURETIC PEPTIDE - Abnormal; Notable for the following components:   B Natriuretic Peptide 314.1 (*)    All other components within normal limits  D-DIMER, QUANTITATIVE - Abnormal; Notable  for the following components:   D-Dimer, Quant 4.32 (*)    All other components within normal limits  TROPONIN I (HIGH SENSITIVITY) - Abnormal; Notable for the following components:   Troponin I (High Sensitivity) 208 (*)    All other components within normal limits  TROPONIN I (HIGH SENSITIVITY) - Abnormal; Notable for the following components:   Troponin I (High Sensitivity) 209 (*)    All other components within normal limits  TROPONIN I (HIGH SENSITIVITY)    EKG None  Radiology CT Angio Chest PE W and/or Wo Contrast Result Date: 04/10/2024 EXAMINATION: CTA CHEST PE CLINICAL INDICATION: Pulmonary embolism (PE) suspected, low to intermediate prob, positive D-dimer TECHNIQUE: This examination was performed according to an angiographic protocol with 3D post-processing. This involves 3D reconstructions, MIPs, volume rendered images and/or shaded surface rendering. One or more of the following dose reduction techniques were used: Automated exposure control, adjustment of the mA and/or kV according to patient size, and/or iterative reconstruction. Unless otherwise specified, incidental findings do not require dedicated imaging follow-up. COMPARISON: 10/05/2018 FINDINGS: The pulmonary arteries are well-opacified. No intraluminal filling defects are identified. There is no thoracic aortic aneurysm. Marked left ventricular enlargement is present. A left pericardial cyst is present which does not require additional follow-up. No pericardial effusion is present. There is no pleural effusion. No adenopathy is identified in the chest. The thyroid  gland has a normal appearance. The esophagus is within normal limits. A small sliding hiatal hernia is present. Mild right apical groundglass attenuation is present, nonspecific. Clustered micronodules are present in the right lower lobe, consistent with old inflammatory disease. The left lung is clear. A 2 cm rounded area of low attenuation is present in the spleen,  not present previously. There is no acute fracture or destructive process. IMPRESSION: 1. No evidence of pulmonary embolism. 2. Development of splenic mass as described above. Dedicated splenic CT or MRI scanning is recommended for further characterization. Electronically signed by: Buster Cash MD 04/10/2024 04:37 PM EDT RP Workstation: [id number]   DG Chest 2 View Result Date: 04/10/2024 CLINICAL DATA:  Defibrillator  malfunction. EXAM: CHEST - 2 VIEW COMPARISON:  April 05, 2024. FINDINGS: Stable cardiomegaly. Left-sided defibrillator is unchanged. Lungs are clear. Bony thorax is unremarkable. IMPRESSION: No active cardiopulmonary disease. Electronically Signed   By: Rosalene Colon M.D.   On: 04/10/2024 12:51    Procedures Procedures    Medications Ordered in ED Medications  iohexol  (OMNIPAQUE ) 350 MG/ML injection 75 mL (75 mLs Intravenous Contrast Given 04/10/24 1612)    ED Course/ Medical Decision Making/ A&P Clinical Course as of 04/10/24 1645  Wed Apr 10, 2024  1320 Troponin I (High Sensitivity)(!!): 208 [JL]  1510 D-Dimer, Quant(!): 4.32 [JL]  1510 B Natriuretic Peptide(!): 314.1 [JL]    Clinical Course User Index [JL] Rosealee Concha, MD                                 Medical Decision Making Amount and/or Complexity of Data Reviewed Labs: ordered. Decision-making details documented in ED Course. Radiology: ordered.  Risk Prescription drug management.      73 year old female with medical history significant for HLD, HTN, DVT, type B aortic dissection, atrial fibrillation on Eliquis , nonischemic cardiomyopathy and CHF with AICD in place who presents to the emergency department after being notified that her ICD has fired 3 times in the last 24 hours.  The patient states that she was just recently hospitalized and on chart review she was discharged on 04/08/2024 after an inpatient observation period for a near syncopal event.  Cardiology had been consulted and evaluated the  patient and had been recommended to hold her furosemide  and Coreg  and continue her other home medications and cut her spironolactone  in half.  Her most recent 2D echocardiogram on admission showed an EF of less than 20%.  She states that she has been completely have symptomatic over the past few days.  She denies any chest pain.  She states that she has had a dry cough and mild shortness of breath for the past few days.  She was called and told that her defibrillator went off 3 times last night.  She was advised to present to the emergency department for further evaluation.  She has been compliant with her home Eliquis .  On arrival, the patient was vitally stable.  Afebrile, not tachycardic, BP 104/75, respiratory rate 18, saturating 95% on room air.  Physical exam generally unremarkable as per above.  EKG revealed a paced rhythm, rate 66, no significant changes compared to prior EKGs.   CXR: Unremarkable  Labs: Initial cardiac troponin 209, BNP 314, D-dimer elevated at 4.32.  BMP without significant electrolyte abnormality, normal renal function, CBC without a leukocytosis or anemia.  Delta troponin pending.  With elevated D-dimer, dry cough with shortness of breath, will obtain CTA PE study to evaluate for breakthrough PE despite the patient's anticoagulation.  CTA PE:  IMPRESSION:  1. No evidence of pulmonary embolism.  2. Development of splenic mass as described above. Dedicated splenic CT or MRI  scanning is recommended for further characterization.    Cardiology consult: Pending at time of signout. Signout given to Dr. Daivd Dub pending cardiology consult and recs, pending delta trop.   Final Clinical Impression(s) / ED Diagnoses Final diagnoses:  AICD discharge  Elevated troponin    Rx / DC Orders ED Discharge Orders     None         Rosealee Concha, MD 04/10/24 1645

## 2024-04-10 NOTE — ED Provider Notes (Signed)
 AICD fired last night, asymptomatic. Called by [facility] rep to come to ED. PE study negative. Call out to cards pending call back. Physical Exam  BP 106/84 Comment: Simultaneous filing. User may not have seen previous data.  Pulse 66 Comment: Simultaneous filing. User may not have seen previous data.  Temp (!) 97.5 F (36.4 C)   Resp 17 Comment: Simultaneous filing. User may not have seen previous data.  Ht 5' 1 (1.549 m)   Wt 118.4 kg   SpO2 98% Comment: Simultaneous filing. User may not have seen previous data.  BMI 49.32 kg/m   Physical Exam  Procedures  Procedures  ED Course / MDM   Clinical Course as of 04/10/24 1644  Wed Apr 10, 2024  1320 Troponin I (High Sensitivity)(!!): 208 [JL]  1510 D-Dimer, Quant(!): 4.32 [JL]  1510 B Natriuretic Peptide(!): 314.1 [JL]    Clinical Course User Index [JL] Jerrol Agent, MD   Medical Decision Making Amount and/or Complexity of Data Reviewed Labs: ordered. Decision-making details documented in ED Course. Radiology: ordered.  Risk Prescription drug management.   17: 05 callback from [facility] cardiology, they will do consult in the emergency department.       Armenta Canning, MD 04/22/24 [phone]

## 2024-04-11 DIAGNOSIS — Z853 Personal history of malignant neoplasm of breast: Secondary | ICD-10-CM | POA: Diagnosis not present

## 2024-04-11 DIAGNOSIS — I110 Hypertensive heart disease with heart failure: Secondary | ICD-10-CM | POA: Diagnosis present

## 2024-04-11 DIAGNOSIS — Z91048 Other nonmedicinal substance allergy status: Secondary | ICD-10-CM | POA: Diagnosis not present

## 2024-04-11 DIAGNOSIS — M7989 Other specified soft tissue disorders: Secondary | ICD-10-CM | POA: Diagnosis present

## 2024-04-11 DIAGNOSIS — Z79890 Hormone replacement therapy (postmenopausal): Secondary | ICD-10-CM | POA: Diagnosis not present

## 2024-04-11 DIAGNOSIS — Z8619 Personal history of other infectious and parasitic diseases: Secondary | ICD-10-CM | POA: Diagnosis not present

## 2024-04-11 DIAGNOSIS — I4891 Unspecified atrial fibrillation: Secondary | ICD-10-CM | POA: Diagnosis present

## 2024-04-11 DIAGNOSIS — Z79899 Other long term (current) drug therapy: Secondary | ICD-10-CM | POA: Diagnosis not present

## 2024-04-11 DIAGNOSIS — Z886 Allergy status to analgesic agent status: Secondary | ICD-10-CM | POA: Diagnosis not present

## 2024-04-11 DIAGNOSIS — I428 Other cardiomyopathies: Secondary | ICD-10-CM | POA: Diagnosis present

## 2024-04-11 DIAGNOSIS — Z83511 Family history of glaucoma: Secondary | ICD-10-CM | POA: Diagnosis not present

## 2024-04-11 DIAGNOSIS — I951 Orthostatic hypotension: Secondary | ICD-10-CM | POA: Diagnosis present

## 2024-04-11 DIAGNOSIS — Z95810 Presence of automatic (implantable) cardiac defibrillator: Secondary | ICD-10-CM | POA: Diagnosis not present

## 2024-04-11 DIAGNOSIS — I472 Ventricular tachycardia, unspecified: Secondary | ICD-10-CM | POA: Diagnosis present

## 2024-04-11 DIAGNOSIS — E785 Hyperlipidemia, unspecified: Secondary | ICD-10-CM | POA: Diagnosis present

## 2024-04-11 DIAGNOSIS — I5022 Chronic systolic (congestive) heart failure: Secondary | ICD-10-CM

## 2024-04-11 DIAGNOSIS — Z8249 Family history of ischemic heart disease and other diseases of the circulatory system: Secondary | ICD-10-CM | POA: Diagnosis not present

## 2024-04-11 DIAGNOSIS — E669 Obesity, unspecified: Secondary | ICD-10-CM | POA: Diagnosis present

## 2024-04-11 DIAGNOSIS — I4901 Ventricular fibrillation: Secondary | ICD-10-CM | POA: Diagnosis present

## 2024-04-11 DIAGNOSIS — I480 Paroxysmal atrial fibrillation: Secondary | ICD-10-CM | POA: Diagnosis present

## 2024-04-11 DIAGNOSIS — Z6841 Body Mass Index (BMI) 40.0 and over, adult: Secondary | ICD-10-CM | POA: Diagnosis not present

## 2024-04-11 DIAGNOSIS — Z888 Allergy status to other drugs, medicaments and biological substances status: Secondary | ICD-10-CM | POA: Diagnosis not present

## 2024-04-11 DIAGNOSIS — Z7901 Long term (current) use of anticoagulants: Secondary | ICD-10-CM | POA: Diagnosis not present

## 2024-04-11 DIAGNOSIS — H409 Unspecified glaucoma: Secondary | ICD-10-CM | POA: Diagnosis present

## 2024-04-11 DIAGNOSIS — R578 Other shock: Secondary | ICD-10-CM | POA: Diagnosis not present

## 2024-04-11 LAB — COMPREHENSIVE METABOLIC PANEL WITH GFR
ALT: 11 U/L (ref 0–44)
AST: 19 U/L (ref 15–41)
Albumin: 2.6 g/dL — ABNORMAL LOW (ref 3.5–5.0)
Alkaline Phosphatase: 70 U/L (ref 38–126)
Anion gap: 15 (ref 5–15)
BUN: 18 mg/dL (ref 8–23)
CO2: 23 mmol/L (ref 22–32)
Calcium: 8.4 mg/dL — ABNORMAL LOW (ref 8.9–10.3)
Chloride: 93 mmol/L — ABNORMAL LOW (ref 98–111)
Creatinine, Ser: 1.16 mg/dL — ABNORMAL HIGH (ref 0.44–1.00)
GFR, Estimated: 50 mL/min — ABNORMAL LOW (ref >60)
Glucose, Bld: 392 mg/dL — ABNORMAL HIGH (ref 70–99)
Potassium: 3.9 mmol/L (ref 3.5–5.1)
Sodium: 131 mmol/L — ABNORMAL LOW (ref 135–145)
Total Bilirubin: 0.6 mg/dL (ref 0.0–1.2)
Total Protein: 5.9 g/dL — ABNORMAL LOW (ref 6.5–8.1)

## 2024-04-11 LAB — GLUCOSE, CAPILLARY: Glucose-Capillary: 138 mg/dL — ABNORMAL HIGH (ref 70–99)

## 2024-04-11 LAB — TSH: TSH: 3.16 u[IU]/mL (ref 0.350–4.500)

## 2024-04-11 LAB — MAGNESIUM: Magnesium: 1.9 mg/dL (ref 1.7–2.4)

## 2024-04-11 MED ORDER — SPIRONOLACTONE 12.5 MG HALF TABLET
12.5000 mg | ORAL_TABLET | Freq: Every day | ORAL | Status: DC
  Administered 2024-04-11 – 2024-04-12 (×2): 12.5 mg via ORAL
  Filled 2024-04-11 (×2): qty 1

## 2024-04-11 MED ORDER — DICLOFENAC SODIUM 1 % EX GEL
4.0000 g | Freq: Three times a day (TID) | CUTANEOUS | Status: DC | PRN
  Administered 2024-04-12: 4 g via TOPICAL
  Filled 2024-04-11: qty 100

## 2024-04-11 MED ORDER — DOCUSATE SODIUM 100 MG PO CAPS: 100.0000 mg | ORAL_CAPSULE | Freq: Two times a day (BID) | ORAL | Status: DC | PRN

## 2024-04-11 MED ORDER — PANTOPRAZOLE SODIUM 40 MG PO TBEC
80.0000 mg | DELAYED_RELEASE_TABLET | Freq: Every day | ORAL | Status: DC
  Administered 2024-04-11 – 2024-04-12 (×2): 80 mg via ORAL
  Filled 2024-04-11 (×2): qty 2

## 2024-04-11 MED ORDER — AMIODARONE HCL 200 MG PO TABS
400.0000 mg | ORAL_TABLET | Freq: Two times a day (BID) | ORAL | Status: DC
  Administered 2024-04-11 – 2024-04-12 (×2): 400 mg via ORAL
  Filled 2024-04-11 (×3): qty 2

## 2024-04-11 MED ORDER — NYSTATIN 100000 UNIT/GM EX POWD
Freq: Two times a day (BID) | CUTANEOUS | Status: DC
  Filled 2024-04-11 (×2): qty 15

## 2024-04-11 MED ORDER — LATANOPROST 0.005 % OP SOLN
1.0000 [drp] | Freq: Every day | OPHTHALMIC | Status: DC
  Filled 2024-04-11: qty 2.5

## 2024-04-11 MED ORDER — ALLOPURINOL 300 MG PO TABS
300.0000 mg | ORAL_TABLET | Freq: Every day | ORAL | Status: DC
  Administered 2024-04-11 – 2024-04-12 (×2): 300 mg via ORAL
  Filled 2024-04-11 (×2): qty 1

## 2024-04-11 MED ORDER — ROSUVASTATIN CALCIUM 5 MG PO TABS
5.0000 mg | ORAL_TABLET | Freq: Every evening | ORAL | Status: DC
  Administered 2024-04-11: 5 mg via ORAL
  Filled 2024-04-11: qty 1

## 2024-04-11 MED ORDER — SODIUM CHLORIDE 0.9% FLUSH
3.0000 mL | Freq: Two times a day (BID) | INTRAVENOUS | Status: DC
  Administered 2024-04-11 (×2): 3 mL via INTRAVENOUS

## 2024-04-11 MED ORDER — SACUBITRIL-VALSARTAN 24-26 MG PO TABS
1.0000 | ORAL_TABLET | Freq: Two times a day (BID) | ORAL | Status: DC
  Administered 2024-04-11 – 2024-04-12 (×4): 1 via ORAL
  Filled 2024-04-11 (×4): qty 1

## 2024-04-11 MED ORDER — ACETAMINOPHEN 325 MG PO TABS: 650.0000 mg | ORAL_TABLET | ORAL | Status: DC | PRN

## 2024-04-11 MED ORDER — AMIODARONE HCL 200 MG PO TABS: 200.0000 mg | ORAL_TABLET | Freq: Every day | ORAL | Status: DC

## 2024-04-11 MED ORDER — SODIUM CHLORIDE 0.9% FLUSH: 3.0000 mL | INTRAVENOUS | Status: DC | PRN

## 2024-04-11 MED ORDER — LEVOTHYROXINE SODIUM 25 MCG PO TABS
25.0000 ug | ORAL_TABLET | Freq: Every day | ORAL | Status: DC
  Administered 2024-04-11 – 2024-04-12 (×2): 25 ug via ORAL
  Filled 2024-04-11 (×2): qty 1

## 2024-04-11 MED ORDER — TRAMADOL HCL 50 MG PO TABS
50.0000 mg | ORAL_TABLET | Freq: Four times a day (QID) | ORAL | Status: DC | PRN
  Administered 2024-04-11 – 2024-04-12 (×3): 50 mg via ORAL
  Filled 2024-04-11 (×4): qty 1

## 2024-04-11 MED ORDER — APIXABAN 5 MG PO TABS: 5.0000 mg | ORAL_TABLET | Freq: Two times a day (BID) | ORAL | Status: DC

## 2024-04-11 MED ORDER — PAROXETINE HCL 30 MG PO TABS
30.0000 mg | ORAL_TABLET | Freq: Every day | ORAL | Status: DC
Start: 2024-04-11 — End: 2024-04-12
  Administered 2024-04-11 (×2): 30 mg via ORAL
  Filled 2024-04-11 (×3): qty 1

## 2024-04-11 MED ORDER — SODIUM CHLORIDE 0.9 % IV SOLN: 250.0000 mL | INTRAVENOUS | Status: AC | PRN

## 2024-04-11 MED ORDER — FAMOTIDINE 20 MG PO TABS
20.0000 mg | ORAL_TABLET | Freq: Two times a day (BID) | ORAL | Status: DC
  Administered 2024-04-11 – 2024-04-12 (×4): 20 mg via ORAL
  Filled 2024-04-11 (×4): qty 1

## 2024-04-11 NOTE — Progress Notes (Signed)
   04/11/24 1207  TOC Brief Assessment  Insurance and Status Reviewed  Patient has primary care physician Yes  Home environment has been reviewed home w/ spouse  Prior level of function: uses walker/wheelchair  Prior/Current Home Services Current home services (d/c'd on 6/9 w/ referral to [facility] for HHPT)  Social Drivers of Health Review SDOH reviewed no interventions necessary  Readmission risk has been reviewed Yes  Transition of care needs transition of care needs identified, TOC will continue to follow    Pt with re-admit, noted referral sent to [facility] for HHPT on last discharge, TOC to follow.

## 2024-04-11 NOTE — Progress Notes (Signed)
 Heart Failure Navigator Progress Note  Assessed for Heart & Vascular TOC clinic readiness.  Patient does not meet criteria due to Advanced Heart Failure Team patient of Dr. Gala Romney. .   Navigator will sign off at this time.   Rhae Hammock, BSN, Scientist, clinical (histocompatibility and immunogenetics) Only

## 2024-04-11 NOTE — Evaluation (Addendum)
 Physical Therapy Evaluation Patient Details Name: [name] MRN: [id number] DOB: 11/21/1950 Today's Date: 04/11/2024  History of Present Illness  73 y.o. female who is being seen 04/10/24 for the evaluation of VT with ICD firing. Hospitalized 6/7-6/9 for near syncope with +orthostasis. PMH significant of paroxysmal A-fib on Eliquis , chronic HFrEF, status post pacemaker and defibrillator, hypertension, hyperlipidemia, hypothyroidism,  LBBB, CKD stage IIIa, breast cancer, type B aortic dissection in 2010, DVT, hiatal hernia, GERD, glaucoma, depression, gout  Clinical Impression   Pt admitted secondary to problem above with deficits below. PTA patient lives with spouse in one level home with ramped entrance. She uses rollator at baseline and at times has to sit on seat due to weakness/fatigue. Pt currently was limited by weak feeling in her legs while assessing orthostatic BPs. See below. She tolerated bed to chair with recovery in BP but still reporting feeling weak in her legs. Anticipate patient will benefit from PT to address problems listed below. Will continue to follow acutely to maximize functional mobility, independence, and safety.  Patient could benefit from HHPT for home safety evaluation and regaining strength and mobility on discharge.        04/11/24 1028  Orthostatic Lying   BP- Lying 99/56  Pulse- Lying 80  Orthostatic Sitting  BP- Sitting (!) 85/46  Pulse- Sitting 93  Orthostatic Standing at 0 minutes  BP- Standing at 0 minutes 93/58  Pulse- Standing at 0 minutes 105      If plan is discharge home, recommend the following: A little help with walking and/or transfers;A lot of help with bathing/dressing/bathroom;Assistance with cooking/housework;Assist for transportation;Help with stairs or ramp for entrance   Can travel by private vehicle        Equipment Recommendations None recommended by PT  Recommendations for Other Services       Functional Status  Assessment Patient has had a recent decline in their functional status and demonstrates the ability to make significant improvements in function in a reasonable and predictable amount of time.     Precautions / Restrictions Precautions Precautions: Fall Recall of Precautions/Restrictions: Intact Restrictions Weight Bearing Restrictions Per Provider Order: No      Mobility  Bed Mobility Overal bed mobility: Needs Assistance Bed Mobility: Supine to Sit     Supine to sit: HOB elevated, Used rails, Modified independent (Device/Increase time)     General bed mobility comments: no physical assist; incr effort    Transfers Overall transfer level: Needs assistance Equipment used: Rolling walker (2 wheels) Transfers: Sit to/from Stand Sit to Stand: Contact guard assist           General transfer comment: CGA due to low BP supine to sit; denied dizziness and BP recovered in standing    Ambulation/Gait Ambulation/Gait assistance: Supervision Gait Distance (Feet): 5 Feet Assistive device: Rolling walker (2 wheels) Gait Pattern/deviations: Step-through pattern, Decreased stride length, Wide base of support   Gait velocity interpretation: <1.8 ft/sec, indicate of risk for recurrent falls   General Gait Details: mild dyspnea  Stairs            Wheelchair Mobility     Tilt Bed    Modified Rankin (Stroke Patients Only)       Balance Overall balance assessment: Mild deficits observed, not formally tested  Pertinent Vitals/Pain Pain Assessment Pain Assessment: Faces Faces Pain Scale: Hurts little more Pain Location: tailbone, knees Pain Descriptors / Indicators: Aching, Discomfort Pain Intervention(s): Limited activity within patient's tolerance, Monitored during session, Repositioned    Home Living Family/patient expects to be discharged to:: Private residence Living Arrangements: Spouse/significant  other Available Help at Discharge: Family;Available 24 hours/day Type of Home: House Home Access: Ramped entrance       Home Layout: One level Home Equipment: Grab bars - toilet;Rolling Walker (2 wheels);Rollator (4 wheels);Shower seat;BSC/3in1;Grab bars - tub/shower;Hand held shower head;Wheelchair - manual      Prior Function Prior Level of Function : Needs assist;Driving;History of Falls (last six months)             Mobility Comments: walks with rollator; sitting at dining room table and chair leg broke, hurt tailbone; sits on donut ADLs Comments: mostly independent with dressing after setup (sometimes having bad day and needs help with pants)     Extremity/Trunk Assessment   Upper Extremity Assessment Upper Extremity Assessment: Generalized weakness    Lower Extremity Assessment Lower Extremity Assessment: Generalized weakness (bil knee OA; left worse)    Cervical / Trunk Assessment Cervical / Trunk Assessment: Other exceptions Cervical / Trunk Exceptions: overweight  Communication   Communication Communication: No apparent difficulties    Cognition Arousal: Alert Behavior During Therapy: WFL for tasks assessed/performed   PT - Cognitive impairments: No apparent impairments                         Following commands: Intact       Cueing Cueing Techniques: Verbal cues     General Comments General comments (skin integrity, edema, etc.): see vitals flowsheet for orthostatic BPs    Exercises     Assessment/Plan    PT Assessment Patient needs continued PT services  PT Problem List Decreased activity tolerance;Decreased balance;Decreased mobility;Decreased knowledge of use of DME;Cardiopulmonary status limiting activity;Obesity       PT Treatment Interventions DME instruction;Gait training;Functional mobility training;Therapeutic activities;Therapeutic exercise;Balance training;Patient/family education    PT Goals (Current goals can be found  in the Care Plan section)  Acute Rehab PT Goals Patient Stated Goal: feel better PT Goal Formulation: With patient Time For Goal Achievement: 04/25/24 Potential to Achieve Goals: Good    Frequency Min 2X/week     Co-evaluation               AM-PAC PT 6 Clicks Mobility  Outcome Measure Help needed turning from your back to your side while in a flat bed without using bedrails?: A Little Help needed moving from lying on your back to sitting on the side of a flat bed without using bedrails?: A Little Help needed moving to and from a bed to a chair (including a wheelchair)?: A Little Help needed standing up from a chair using your arms (e.g., wheelchair or bedside chair)?: A Little Help needed to walk in hospital room?: A Little Help needed climbing 3-5 steps with a railing? : Total 6 Click Score: 16    End of Session   Activity Tolerance: Patient limited by fatigue;Treatment limited secondary to medical complications (Comment) (feels weak; denies lightheadedness) Patient left: with call bell/phone within reach;in chair Nurse Communication: Mobility status;Other (comment) (orthostatics done') PT Visit Diagnosis: Difficulty in walking, not elsewhere classified (R26.2)    Time: 2952-8413 PT Time Calculation (min) (ACUTE ONLY): 24 min   Charges:   PT Evaluation $PT Eval Low Complexity: 1  Low PT Treatments $Gait Training: 8-22 mins PT General Charges $$ ACUTE PT VISIT: 1 Visit          Gayle Kava, PT Acute Rehabilitation Services  Office [phone]   Guilford Leep 04/11/2024, 10:29 AM

## 2024-04-11 NOTE — Progress Notes (Addendum)
  Patient Name: [name] Date of Encounter: 04/11/2024  Primary Cardiologist: None Electrophysiologist: Brittney Prather Cortland Ding, MD  Interval Summary   NAEON, transferred to floor earlier this AM  She has no acute complaints this AM, no palpitations or episodes of AFib that she is aware of.   Vital Signs    Vitals:   04/11/24 0400 04/11/24 0500 04/11/24 0600 04/11/24 0738  BP:  93/78 (!) 107/54   Pulse:  85 74   Resp:  17 12   Temp: 98.1 F (36.7 C)   97.8 F (36.6 C)  TempSrc: Oral   Oral  SpO2:  97% 100%   Weight:    118.9 kg  Height:        Intake/Output Summary (Last 24 hours) at 04/11/2024 1008 Last data filed at 04/11/2024 1478 Gross per 24 hour  Intake 105.58 ml  Output --  Net 105.58 ml   Filed Weights   04/10/24 1150 04/11/24 0738  Weight: 118.4 kg 118.9 kg    Physical Exam    GEN- obese female laying in bed, alert and oriented x 3 today.   Lungs- Clear to ausculation bilaterally, normal work of breathing Cardiac- Regular rate and rhythm, no murmurs, rubs or gallops GI- soft, NT, ND, + BS Extremities- no clubbing or cyanosis. No edema  Telemetry    AS-VP at 60s, rare PVC (personally reviewed)  Hospital Course    [name] is a 73 y.o. female with PMH of NICM, HFrEF s/p CRT-D , parox afib, HTN, HLD, DVT, type b aortic dissection, breast Ca s/p lumpectomy, XRT, chemo admitted for AFib w RVR with ICD shocks.  Assessment & Plan    #) AFib w RVR #) HFrEF, thought d/t chemotherapy #) [employer] dual chamber ICD #) multiple ICD shocks #) HTN #) orthostatic hypotension   Arrhythmia quiescent on IV amiodarone  [name] continue IV amio though today, anticipate transition to PO this afternoon as as long as afib/VT remain controlled Continue home GDMT Continue 5mg  eliquis  for stroke ppx       For questions or updates, please contact [name] Please consult [email or web address] for contact info under     Signed, Brittney Riddle, NP   04/11/2024, 10:08 AM    I have seen and examined this patient with [name].  Agree with above, note added to reflect my findings.  Patient admitted yesterday with atrial fibrillation and VT/VF after ICD shock.  She is feeling well today.  Tolerating amiodarone  without issue.  GEN: No acute distress.   Neck: No JVD Cardiac: RRR, no murmurs, rubs, or gallops.  Respiratory: normal BS bases bilaterally. GI: Soft, nontender, non-distended  MS: No edema; No deformity. Neuro:  Nonfocal  Skin: warm and dry Psych: Normal affect    Atrial fibrillation with rapid response Chronic systolic heart failure  ICD shocks  Was in atrial fibrillation originally, but after ICD shock developed VT/VF.  She has had no further arrhythmias since her amiodarone  was started.  [name] continue amiodarone  throughout the day today.  [name] plan for p.o. amiodarone  400 mg twice daily starting tonight.  [name] M. Turhan Chill MD 04/11/2024 10:15 AM

## 2024-04-11 NOTE — Care Management Obs Status (Signed)
 MEDICARE OBSERVATION STATUS NOTIFICATION   Patient Details  Name: [name] MRN: [id number] Date of Birth: Oct 07, 1951   Medicare Observation Status Notification Given:  Yes    Rox Cope, RN 04/11/2024, 12:15 PM

## 2024-04-12 ENCOUNTER — Other Ambulatory Visit (HOSPITAL_COMMUNITY): Payer: Self-pay

## 2024-04-12 DIAGNOSIS — I4891 Unspecified atrial fibrillation: Secondary | ICD-10-CM | POA: Diagnosis not present

## 2024-04-12 DIAGNOSIS — R578 Other shock: Secondary | ICD-10-CM | POA: Diagnosis not present

## 2024-04-12 DIAGNOSIS — I5022 Chronic systolic (congestive) heart failure: Secondary | ICD-10-CM | POA: Diagnosis not present

## 2024-04-12 DIAGNOSIS — I110 Hypertensive heart disease with heart failure: Secondary | ICD-10-CM | POA: Diagnosis not present

## 2024-04-12 DIAGNOSIS — I4901 Ventricular fibrillation: Secondary | ICD-10-CM | POA: Diagnosis not present

## 2024-04-12 DIAGNOSIS — Z6841 Body Mass Index (BMI) 40.0 and over, adult: Secondary | ICD-10-CM | POA: Diagnosis not present

## 2024-04-12 MED ORDER — AMIODARONE HCL 200 MG PO TABS
ORAL_TABLET | ORAL | 4 refills | Status: DC
  Filled 2024-04-12: qty 90, 90d supply, fill #0

## 2024-04-12 MED ORDER — AMIODARONE HCL 200 MG PO TABS: 400.0000 mg | ORAL_TABLET | Freq: Two times a day (BID) | ORAL | Status: DC

## 2024-04-12 MED ORDER — AMIODARONE HCL 200 MG PO TABS
200.0000 mg | ORAL_TABLET | Freq: Every day | ORAL | Status: DC
Start: 2024-04-26 — End: 2024-04-12

## 2024-04-12 NOTE — Progress Notes (Signed)
 Mobility Specialist Progress Note:   04/12/24 1057  Mobility  Activity Ambulated with assistance to bathroom  Level of Assistance Minimal assist, patient does 75% or more  Assistive Device Front wheel walker  Distance Ambulated (ft) 12 ft  Activity Response Tolerated well  Mobility Referral Yes  Mobility visit 1 Mobility  Mobility Specialist Start Time (ACUTE ONLY) 1057  Mobility Specialist Stop Time (ACUTE ONLY) 1100  Mobility Specialist Time Calculation (min) (ACUTE ONLY) 3 min   Pt requested assistance to bathroom. Required MinA to sit EOB and stand with RW. Tolerated well, asx throughout. Left pt in bathroom, family in room. Encouraged to pull light when done for assistance.   Alexes Lamarque Mobility Specialist Please contact via Special educational needs teacher or  Rehab office at [phone]

## 2024-04-12 NOTE — TOC Transition Note (Signed)
 Transition of Care [facility]) - Discharge Note Sherin Dingwall RN, BSN Transitions of Care Unit 4E- RN Case Manager See Treatment Team for direct phone #   Patient Details  Name: [name] MRN: [id number] Date of Birth: 05/17/1951  Transition of Care [facility]) CM/SW Contact:  Rox Cope, RN Phone Number: 04/12/2024, 11:53 AM   Clinical Narrative:    Pt stable for transition home today, Pt was recently discharged on 6/9 with referral for [facility] to [facility].  Per conversation with pt and family they have spoken with [facility] but have not been able to schedule initial visit due to return to hospital.  Pt would like to have services for [facility] needs with [facility].  HH orders have been place for resumption with [facility].   [facility] liaison notified of discharge for start of care.   Family to transport home, No further TOC needs noted.    Final next level of care: Home w Home Health Services Barriers to Discharge: Barriers Resolved   Patient Goals and CMS Choice Patient states their goals for this hospitalization and ongoing recovery are:: return home CMS Medicare.gov Compare Post Acute Care list provided to:: Patient Choice offered to / list presented to : Patient, Adult Children, Spouse      Discharge Placement               Home w/ [facility]        Discharge Plan and Services Additional resources added to the After Visit Summary for       Post Acute Care Choice: Home Health, Resumption of Svcs/PTA Provider          DME Arranged: N/A DME Agency: NA       HH Arranged: PT, OT HH Agency: [facility] Date HH Agency Contacted: 04/12/24 Time HH Agency Contacted: 1153 Representative spoke with at [facility] Agency: Amy  Social Drivers of Health (SDOH) Interventions SDOH Screenings   Food Insecurity: No Food Insecurity (04/11/2024)  Housing: Low Risk  (04/11/2024)  Transportation Needs: No Transportation Needs (04/11/2024)  Utilities: Not At Risk (04/11/2024)  Social Connections:  Unknown (04/11/2024)  Tobacco Use: Low Risk  (04/10/2024)     Readmission Risk Interventions    04/12/2024   11:53 AM  Readmission Risk Prevention Plan  Transportation Screening Complete  PCP or Specialist Appt within 5-7 Days Complete  Home Care Screening Complete  Medication Review (RN CM) Complete

## 2024-04-12 NOTE — Discharge Summary (Addendum)
 ELECTROPHYSIOLOGY DISCHARGE SUMMARY    Patient ID: [name],  MRN: [id number], DOB/AGE: [birth year] 73 y.o.  Admit date: 04/10/2024 Discharge date: 04/12/2024  Primary Care Physician: Abbe Hoard., MD  Primary Cardiologist: None  Electrophysiologist: Dr. Lawana Pray   Primary Discharge Diagnosis:  Atrial fibrillation w RVR  Secondary Discharge Diagnosis:  Ventricular tachycardia HFrEF Dual chamber ICD ICD shock   Procedures This Admission:  none      Brief HPI: [name] is a 73 y.o. female with a history of NICM, HFrEF s/p CRT-D , parox afib, HTN, HLD, DVT, type b aortic dissection, breast Ca s/p lumpectomy, XRT, chemo admitted for AFib w RVR, VT/VF with ICD shocks.   Hospital Course:  The patient was admitted 04/10/2024 after multiple ICD shocks. Device interrogation revealed AFib w RVR as original arrhythmia, that was treated with ATP that devolved the RVR into VT/VF for which she received ICD shock x 3. She was asymptomatic during entire episode and HV therapies. She was IV amiodarone  loaded.   They were monitored on telemetry overnight which demonstrated AS-VP with brief AFib episodes.  The patient was examined and considered to be stable for discharge.  The patient [name] be seen back by EP APP in 4-6 weeks for post hospital care.   Physical Exam: Vitals:   04/11/24 1940 04/11/24 2306 04/12/24 0339 04/12/24 0742  BP: 106/70 (!) 113/54 112/65 107/65  Pulse: 74 75 80 72  Resp: 20 20 20 18   Temp: 98.1 F (36.7 C) 98 F (36.7 C) 98 F (36.7 C) 97.6 F (36.4 C)  TempSrc: Oral Oral Oral Oral  SpO2: 96% 96% 95% 93%  Weight:   119 kg   Height:        GEN- elderly female lying in bed, NAD. A&O x 3.  HEENT: Normocephalic, atraumatic Lungs- CTAB, Normal effort.  Heart- RRR, No M/G/R.  GI- Soft, NT, obese Extremities- No clubbing, cyanosis, or obvious edema. Difficult to assess edema d/t body habitus  Discharge Medications:  Allergies as of 04/12/2024        Reactions   Alphagan [brimonidine]    Irritation    Mobic [meloxicam] Other (See Comments)   Cannot take due to heart issue   Nsaids Other (See Comments)   Cannot take due to heart issue   Polyvinyl Alcohol Other (See Comments)   Travatan and Alphagan(irritation)   Statins Other (See Comments)   Per pt her cardiologist told her not to take   Tolmetin Other (See Comments)   Can not take due to heart issue   Travatan Z [travoprost (bak Free)]    Irritation   Tape Rash   Uncoded Allergy. Allergen: Tape Blister from clear tapes.        Medication List     TAKE these medications    allopurinol  300 MG tablet Commonly known as: ZYLOPRIM  Take 300 mg by mouth daily.   amiodarone  200 MG tablet Commonly known as: PACERONE  Take 2 pills twice a day (AM and PM) for 2 weeks, then reduce to 1 pill once a day   apixaban  5 MG Tabs tablet Commonly known as: ELIQUIS  Take 1 tablet (5 mg total) by mouth 2 (two) times daily.   bimatoprost  0.03 % ophthalmic solution Commonly known as: LUMIGAN  Place 1 drop into both eyes at bedtime. Reported on 01/12/2016   Calcium -Vitamin D3 600-200 MG-UNIT Tabs Take 1 tablet by mouth 2 (two) times daily.   cetirizine 10 MG tablet Commonly known as: ZYRTEC  Take 10 mg by mouth at bedtime.   clotrimazole-betamethasone cream Commonly known as: LOTRISONE Apply 1 Application topically 2 (two) times daily as needed (Rash).   Colcrys  0.6 MG tablet Generic drug: colchicine  TAKE ONE TABLET BY MOUTH EVERY DAY What changed:  how much to take when to take this reasons to take this   cyanocobalamin 1000 MCG tablet Commonly known as: VITAMIN B12 Take 1,000 mcg by mouth daily.   cyclobenzaprine  10 MG tablet Commonly known as: Flexeril  Take 1 tablet (10 mg total) by mouth 3 (three) times daily as needed for muscle spasms.   Delsym 30 MG/5ML liquid Generic drug: dextromethorphan Take 5 mLs by mouth daily as needed for cough.   Denta 5000 Plus  1.1 % Crea dental cream Generic drug: sodium fluoride Place 1 Application onto teeth 2 (two) times daily.   diclofenac  sodium 1 % Gel Commonly known as: VOLTAREN  APPLY THREE GRAMS TO THREE LARGE JOINTS UP TO THREE TIMES DAILY AS NEEDED   docusate sodium  100 MG capsule Commonly known as: COLACE Take 100 mg by mouth 2 (two) times daily as needed for mild constipation.   Entresto  24-26 MG Generic drug: sacubitril -valsartan  TAKE ONE TABLET BY MOUTH TWICE DAILY   famotidine  20 MG tablet Commonly known as: PEPCID  Take 20 mg by mouth 2 (two) times daily.   ipratropium 0.03 % nasal spray Commonly known as: ATROVENT Place 2 sprays into both nostrils daily as needed for rhinitis.   ivabradine  5 MG Tabs tablet Commonly known as: [name] Take 0.5 tablets (2.5 mg total) by mouth 2 (two) times daily. If hr greater than 70 What changed:  when to take this additional instructions   levothyroxine  25 MCG tablet Commonly known as: SYNTHROID  Take 25 mcg by mouth daily.   montelukast  10 MG tablet Commonly known as: SINGULAIR  Take 10 mg by mouth at bedtime. For allergies & congestion   nystatin  powder Commonly known as: MYCOSTATIN /NYSTOP  Apply topically 2 (two) times daily. What changed:  how much to take when to take this reasons to take this   OCUVITE PO Take 5 mg by mouth daily.   omeprazole  40 MG capsule Commonly known as: PRILOSEC TAKE ONE CAPSULE BY MOUTH TWICE DAILY   PARoxetine  30 MG tablet Commonly known as: PAXIL  Take 30 mg by mouth at bedtime.   rosuvastatin  5 MG tablet Commonly known as: CRESTOR  TAKE ONE TABLET BY MOUTH EVERY EVENING   sodium chloride  0.65 % Soln nasal spray Commonly known as: OCEAN Place 1 spray into both nostrils daily as needed for congestion.   spironolactone  25 MG tablet Commonly known as: ALDACTONE  Take 0.5 tablets (12.5 mg total) by mouth daily.   SYSTANE OP Place 1 drop into both eyes daily as needed (dry eyes).   traMADol  50 MG  tablet Commonly known as: ULTRAM  Take 50 mg by mouth every 6 (six) hours as needed for pain.   Vitamin D 125 MCG (5000 UT) Caps Take 5,000 Units by mouth daily.   vitamin E 1000 UNIT capsule Take 1,000 Units by mouth daily.        Disposition: Home with usual follow up as in AVS  Duration of Discharge Encounter:  APP time: 25 minutes  Signed, Suzann Riddle, NP  04/12/2024 10:43 AM   I have seen and examined this patient with Suzann Riddle.  Agree with above, note added to reflect my findings.  Patient mated to the hospital with multiple ICD shocks.  Device interrogation showed atrial fibrillation with rapid response.  She also had VT/VF requiring 3 ICD shocks.  The event appeared to be atrial fibrillation with rapid response which converted to VT/VF with an ICD shock potentially.  Patient has been started on amiodarone .  [name] plan for follow-up in clinic for amiodarone  adjustment.  GEN: No acute distress.   Neck: No JVD Cardiac: RRR, no murmurs, rubs, or gallops.  Respiratory: normal BS bases bilaterally. GI: Soft, nontender, non-distended  MS: No edema; No deformity. Neuro:  Nonfocal  Skin: warm and dry, device site well healed Psych: Normal affect    MD time at discharge: 30 minutes  Brittney Ausborn M. Marvel Sapp MD 04/12/2024 1:56 PM

## 2024-04-15 ENCOUNTER — Encounter (HOSPITAL_COMMUNITY): Payer: Self-pay

## 2024-04-15 ENCOUNTER — Inpatient Hospital Stay (HOSPITAL_COMMUNITY)
Admission: EM | Admit: 2024-04-15 | Discharge: 2024-04-19 | DRG: 309 | Disposition: A | Discharge status: Home / Self Care | Attending: Cardiology | Admitting: Cardiology

## 2024-04-15 ENCOUNTER — Emergency Department (HOSPITAL_COMMUNITY)

## 2024-04-15 ENCOUNTER — Other Ambulatory Visit: Payer: Self-pay

## 2024-04-15 ENCOUNTER — Telehealth: Payer: Self-pay | Admitting: Student

## 2024-04-15 DIAGNOSIS — D6869 Other thrombophilia: Secondary | ICD-10-CM | POA: Diagnosis present

## 2024-04-15 DIAGNOSIS — I4901 Ventricular fibrillation: Secondary | ICD-10-CM | POA: Diagnosis present

## 2024-04-15 DIAGNOSIS — I5022 Chronic systolic (congestive) heart failure: Secondary | ICD-10-CM | POA: Diagnosis present

## 2024-04-15 DIAGNOSIS — Z9221 Personal history of antineoplastic chemotherapy: Secondary | ICD-10-CM

## 2024-04-15 DIAGNOSIS — I480 Paroxysmal atrial fibrillation: Secondary | ICD-10-CM | POA: Diagnosis not present

## 2024-04-15 DIAGNOSIS — Z83511 Family history of glaucoma: Secondary | ICD-10-CM

## 2024-04-15 DIAGNOSIS — Z923 Personal history of irradiation: Secondary | ICD-10-CM

## 2024-04-15 DIAGNOSIS — I951 Orthostatic hypotension: Secondary | ICD-10-CM | POA: Diagnosis present

## 2024-04-15 DIAGNOSIS — I4891 Unspecified atrial fibrillation: Principal | ICD-10-CM | POA: Diagnosis present

## 2024-04-15 DIAGNOSIS — G4733 Obstructive sleep apnea (adult) (pediatric): Secondary | ICD-10-CM | POA: Diagnosis present

## 2024-04-15 DIAGNOSIS — Z7901 Long term (current) use of anticoagulants: Secondary | ICD-10-CM

## 2024-04-15 DIAGNOSIS — I428 Other cardiomyopathies: Secondary | ICD-10-CM | POA: Diagnosis present

## 2024-04-15 DIAGNOSIS — I110 Hypertensive heart disease with heart failure: Secondary | ICD-10-CM | POA: Diagnosis present

## 2024-04-15 DIAGNOSIS — Z8619 Personal history of other infectious and parasitic diseases: Secondary | ICD-10-CM

## 2024-04-15 DIAGNOSIS — Z6841 Body Mass Index (BMI) 40.0 and over, adult: Secondary | ICD-10-CM

## 2024-04-15 DIAGNOSIS — Z86718 Personal history of other venous thrombosis and embolism: Secondary | ICD-10-CM

## 2024-04-15 DIAGNOSIS — Z79899 Other long term (current) drug therapy: Secondary | ICD-10-CM

## 2024-04-15 DIAGNOSIS — I472 Ventricular tachycardia, unspecified: Secondary | ICD-10-CM | POA: Diagnosis not present

## 2024-04-15 DIAGNOSIS — Z888 Allergy status to other drugs, medicaments and biological substances status: Secondary | ICD-10-CM

## 2024-04-15 DIAGNOSIS — Z853 Personal history of malignant neoplasm of breast: Secondary | ICD-10-CM

## 2024-04-15 DIAGNOSIS — E785 Hyperlipidemia, unspecified: Secondary | ICD-10-CM | POA: Diagnosis present

## 2024-04-15 DIAGNOSIS — E66813 Obesity, class 3: Secondary | ICD-10-CM | POA: Diagnosis present

## 2024-04-15 DIAGNOSIS — Z8249 Family history of ischemic heart disease and other diseases of the circulatory system: Secondary | ICD-10-CM

## 2024-04-15 DIAGNOSIS — Z91048 Other nonmedicinal substance allergy status: Secondary | ICD-10-CM

## 2024-04-15 DIAGNOSIS — Z95810 Presence of automatic (implantable) cardiac defibrillator: Secondary | ICD-10-CM

## 2024-04-15 LAB — BASIC METABOLIC PANEL WITH GFR
Anion gap: 10 (ref 5–15)
BUN: 12 mg/dL (ref 8–23)
CO2: 19 mmol/L — ABNORMAL LOW (ref 22–32)
Calcium: 8.5 mg/dL — ABNORMAL LOW (ref 8.9–10.3)
Chloride: 105 mmol/L (ref 98–111)
Creatinine, Ser: 1.05 mg/dL — ABNORMAL HIGH (ref 0.44–1.00)
GFR, Estimated: 56 mL/min — ABNORMAL LOW (ref >60)
Glucose, Bld: 116 mg/dL — ABNORMAL HIGH (ref 70–99)
Potassium: 4.3 mmol/L (ref 3.5–5.1)
Sodium: 134 mmol/L — ABNORMAL LOW (ref 135–145)

## 2024-04-15 LAB — CBC
HCT: 41.7 % (ref 36.0–46.0)
Hemoglobin: 13.6 g/dL (ref 12.0–15.0)
MCH: 30.9 pg (ref 26.0–34.0)
MCHC: 32.6 g/dL (ref 30.0–36.0)
MCV: 94.8 fL (ref 80.0–100.0)
Platelets: 141 10*3/uL — ABNORMAL LOW (ref 150–400)
RBC: 4.4 MIL/uL (ref 3.87–5.11)
RDW: 15.4 % (ref 11.5–15.5)
WBC: 10.2 10*3/uL (ref 4.0–10.5)
nRBC: 0 % (ref 0.0–0.2)

## 2024-04-15 LAB — TROPONIN I (HIGH SENSITIVITY): Troponin I (High Sensitivity): 21 ng/L — ABNORMAL HIGH (ref <18)

## 2024-04-15 LAB — MAGNESIUM: Magnesium: 2 mg/dL (ref 1.7–2.4)

## 2024-04-15 MED ORDER — AMIODARONE HCL IN DEXTROSE 360-4.14 MG/200ML-% IV SOLN
60.0000 mg/h | INTRAVENOUS | Status: AC
  Administered 2024-04-15: 60 mg/h via INTRAVENOUS
  Filled 2024-04-15: qty 200

## 2024-04-15 MED ORDER — LIDOCAINE 5 % EX PTCH
1.0000 | MEDICATED_PATCH | Freq: Once | CUTANEOUS | Status: AC
  Administered 2024-04-15: 1 via TRANSDERMAL
  Filled 2024-04-15: qty 1

## 2024-04-15 MED ORDER — SODIUM CHLORIDE 0.9 % IV BOLUS
500.0000 mL | Freq: Once | INTRAVENOUS | Status: AC
  Administered 2024-04-15: 500 mL via INTRAVENOUS

## 2024-04-15 MED ORDER — AMIODARONE HCL IN DEXTROSE 360-4.14 MG/200ML-% IV SOLN
30.0000 mg/h | INTRAVENOUS | Status: AC
  Administered 2024-04-16 – 2024-04-18 (×4): 30 mg/h via INTRAVENOUS
  Filled 2024-04-15 (×5): qty 200

## 2024-04-15 NOTE — ED Notes (Signed)
 Patient rhythm converted to NSR, EKG captured, MD at bedside.

## 2024-04-15 NOTE — ED Notes (Signed)
 IV team at bedside

## 2024-04-15 NOTE — ED Notes (Signed)
 MD at bedside, aware of patient cardiac rhythm, awaiting cardiology.

## 2024-04-15 NOTE — ED Notes (Signed)
 Called maintenance to fix bed rail.

## 2024-04-15 NOTE — ED Provider Notes (Signed)
 [name] EMERGENCY DEPARTMENT AT [facility] Provider Note   CSN: [id number] Arrival date & time: 04/15/24  2051     Patient presents with: Chest Pain   [name] is a 73 y.o. female.   73 year old presenting emergency department with lightheadedness.  Recently hospitalized for the same.  Called EMS today for chest pain as well as near syncopal episode.  Has a history of AICD/pacemaker.  No fevers chills runny nose sore throat cough.  States she is feeling somewhat weak, but otherwise okay currently   Chest Pain      Prior to Admission medications   Medication Sig Start Date End Date Taking? Authorizing Provider  allopurinol  (ZYLOPRIM ) 300 MG tablet Take 300 mg by mouth daily.    [provider]  amiodarone  (PACERONE ) 200 MG tablet Take 2 tablets by mouth twice a day (AM and PM) for 2 weeks, then reduce to 1 tablet once a day 04/12/24   Riddle, Suzann, NP  apixaban  (ELIQUIS ) 5 MG TABS tablet Take 1 tablet (5 mg total) by mouth 2 (two) times daily. 04/05/24   Nathanel Bal, PA-C  bimatoprost  (LUMIGAN ) 0.03 % ophthalmic solution Place 1 drop into both eyes at bedtime. Reported on 01/12/2016    [provider]  Calcium  Carbonate-Vitamin D (CALCIUM -VITAMIN D3) 600-200 MG-UNIT TABS Take 1 tablet by mouth 2 (two) times daily.    [provider]  cetirizine (ZYRTEC) 10 MG tablet Take 10 mg by mouth at bedtime. 04/08/19   [provider]  Cholecalciferol (VITAMIN D) 125 MCG (5000 UT) CAPS Take 5,000 Units by mouth daily.    [provider]  clotrimazole-betamethasone (LOTRISONE) cream Apply 1 Application topically 2 (two) times daily as needed (Rash).    [provider]  COLCRYS  0.6 MG tablet TAKE ONE TABLET BY MOUTH EVERY DAY Patient taking differently: Take 0.6 mg by mouth daily as needed (for gout). 07/03/18   Nicholas Bari, MD  cyanocobalamin (VITAMIN B12) 1000 MCG tablet Take 1,000 mcg by mouth daily.    [provider]  cyclobenzaprine  (FLEXERIL ) 10 MG tablet Take 1 tablet (10 mg total) by mouth 3 (three) times daily as needed for muscle spasms. 12/27/13   Bensimhon, Rheta Celestine, MD  dextromethorphan (DELSYM) 30 MG/5ML liquid Take 5 mLs by mouth daily as needed for cough.    [provider]  diclofenac  sodium (VOLTAREN ) 1 % GEL APPLY THREE GRAMS TO THREE LARGE JOINTS UP TO THREE TIMES DAILY AS NEEDED 04/04/18   Romayne Clubs, PA-C  docusate sodium  (COLACE) 100 MG capsule Take 100 mg by mouth 2 (two) times daily as needed for mild constipation.    [provider]  ENTRESTO  24-26 MG TAKE ONE TABLET BY MOUTH TWICE DAILY 03/15/24   Bensimhon, Rheta Celestine, MD  famotidine  (PEPCID ) 20 MG tablet Take 20 mg by mouth 2 (two) times daily.    [provider]  ipratropium (ATROVENT) 0.03 % nasal spray Place 2 sprays into both nostrils daily as needed for rhinitis.    [provider]  ivabradine  ([name]) 5 MG TABS tablet Take 0.5 tablets (2.5 mg total) by mouth 2 (two) times daily. If hr greater than 70 Patient taking differently: Take 2.5 mg by mouth See admin instructions. Take 1 tablet by mouth twice a day if HR is greater than 70 07/11/23   Bensimhon, Rheta Celestine, MD  levothyroxine  (SYNTHROID ) 25 MCG tablet Take 25 mcg by mouth daily. 01/15/20   [provider]  montelukast  (SINGULAIR ) 10 MG tablet Take 10 mg by mouth at bedtime. For allergies & congestion    [provider]  Multiple Vitamins-Minerals (OCUVITE PO) Take 5 mg by mouth daily.    [provider]  nystatin  (MYCOSTATIN /NYSTOP ) powder Apply topically 2 (two) times daily. Patient taking differently: Apply 1 Application topically 2 (two) times daily as needed (for dry skin). 04/08/24   Gherghe, Costin M, MD  omeprazole  (PRILOSEC) 40 MG capsule TAKE ONE CAPSULE BY MOUTH TWICE DAILY 08/04/20   Kozlow, Rema Care, MD  PARoxetine  (PAXIL ) 30 MG tablet Take 30 mg by mouth at bedtime. 02/10/16   [provider]  Polyethyl Glycol-Propyl Glycol (SYSTANE OP) Place 1 drop into both eyes daily as needed (dry eyes).    [provider]  rosuvastatin  (CRESTOR ) 5 MG tablet TAKE ONE TABLET BY MOUTH EVERY EVENING 11/07/23   Bensimhon, Rheta Celestine, MD  sodium chloride  (OCEAN) 0.65 % SOLN nasal spray Place 1 spray into both nostrils daily as needed for congestion.    [provider]  sodium fluoride (DENTA 5000 PLUS) 1.1 % CREA dental cream Place 1 Application onto teeth 2 (two) times daily.    [provider]  spironolactone  (ALDACTONE ) 25 MG tablet Take 0.5 tablets (12.5 mg total) by mouth daily. 04/09/24   Bensimhon, Rheta Celestine, MD  traMADol  (ULTRAM ) 50 MG tablet Take 50 mg by mouth every 6 (six) hours as needed for pain.    [provider]  vitamin E 1000 UNIT capsule Take 1,000 Units by mouth daily.    [provider]  ranitidine (ZANTAC) 300 MG capsule Take 300 mg by mouth daily as needed. Acid indigestion  01/24/12  [provider]    Allergies: Alphagan [brimonidine], Mobic [meloxicam], Nsaids, Polyvinyl alcohol, Statins, Tolmetin, Travatan z [travoprost (bak free)], and Tape    Review of Systems  Cardiovascular:  Positive for chest pain.    Updated Vital Signs BP 92/67   Pulse (!) 135   Temp 98 F (36.7 C) (Oral)   Resp 20   Ht 5' 1 (1.549 m)   Wt 119 kg   SpO2 97%   BMI 49.57 kg/m   Physical Exam Vitals and nursing note reviewed.  Constitutional:      Appearance: She is obese.  HENT:     Head: Normocephalic.   Cardiovascular:     Rate and Rhythm: Tachycardia present. Rhythm irregular.     Pulses:          Carotid pulses are 2+ on the right side and 2+ on the left side.    Heart sounds: Normal heart sounds.  Pulmonary:     Effort: Pulmonary effort is normal.     Breath sounds: Normal breath sounds. No wheezing or rhonchi.  Abdominal:     Palpations: Abdomen is soft.   Musculoskeletal:     Cervical back: Normal range of  motion.     Right lower leg: Edema present.     Left lower leg: Edema present.   Skin:    General: Skin is warm.     Capillary Refill: Capillary refill takes less than 2 seconds.   Neurological:     Mental Status: She is alert and oriented to person, place, and time.     (all labs ordered are listed, but only abnormal results are displayed) Labs Reviewed  CBC - Abnormal; Notable for the following components:      Result Value   Platelets 141 (*)  All other components within normal limits  BASIC METABOLIC PANEL WITH GFR  MAGNESIUM   TROPONIN I (HIGH SENSITIVITY)    EKG: None  Radiology: DG Chest Portable 1 View Result Date: 04/15/2024 CLINICAL DATA:  Near syncope chest pain EXAM: PORTABLE CHEST 1 VIEW COMPARISON:  04/10/2024 FINDINGS: Temporary pacing patches obscure the chest. Left-sided pacing device as before. Epicardial leads. Coiled leads over left chest. Cardiomegaly. No acute airspace disease, pleural effusion or pneumothorax. IMPRESSION: No active disease. Cardiomegaly. Electronically Signed   By: Esmeralda Hedge M.D.   On: 04/15/2024 21:42     Procedures   Medications Ordered in the ED  amiodarone  (NEXTERONE  PREMIX) 360-4.14 MG/200ML-% (1.8 mg/mL) IV infusion (60 mg/hr Intravenous New Bag/Given 04/15/24 2201)    Followed by  amiodarone  (NEXTERONE  PREMIX) 360-4.14 MG/200ML-% (1.8 mg/mL) IV infusion (has no administration in time range)  lidocaine  (LIDODERM ) 5 % 1 patch (1 patch Transdermal Patch Applied 04/15/24 2304)  sodium chloride  0.9 % bolus 500 mL (0 mLs Intravenous Stopped 04/15/24 2233)    Clinical Course as of 04/15/24 2313  Mon Apr 15, 2024  2104 73 y.o. female with a history of NICM, HFrEF s/p CRT-D , parox afib, HTN, HLD, DVT, type b aortic dissection, breast Ca s/p lumpectomy, XRT, chemo admitted for AFib w RVR, VT/VF with ICD shocks.  [TY]  2125 Spoke with cardiology fellow; will come see patient.   [TY]  2218 Cardiology fellow will admit patient to  cards service.  [TY]  2313 Called to bedside for rhythm change.  Appears patient back in a paced rhythm at 90 bpm. [TY]    Clinical Course User Index [TY] Rolinda Climes, DO                                 Medical Decision Making 72 year old female presenting emergency department after near syncopal episode with some chest pain.  Complex past medical history.  See ED course.  Recently hospitalized A-fib RVR possible V. Tach; discharged on amiodarone .  EMS reported wide-complex tachycardia concerning for possible V. tach and cardioverted prior to arrival.  Has remained hemodynamically stable.  Patient appears to be A-fib RVR with left bundle branch versus slow V. Tach on monitor. ECG similar findings. No STEMI. Largely asymptomatic currently other than heart rate. Discussed case with cardiology recommending amiodarone  drip and after their evaluation, cardiology will admit.  Basic labs pending. AICD/pacemaker has been interrogated; awaiting report.   Amount and/or Complexity of Data Reviewed Independent Historian: EMS    Details: Reported cardioverting External Data Reviewed:     Details: Discharged on amiodarone .  Labs: ordered. Decision-making details documented in ED Course. Radiology: ordered. Decision-making details documented in ED Course. ECG/medicine tests: ordered.  Risk Prescription drug management. Decision regarding hospitalization.       Final diagnoses:  None    ED Discharge Orders     None          Rolinda Climes, DO 04/15/24 2304

## 2024-04-15 NOTE — ED Notes (Signed)
 Pt visitor stepped out saying pt was requesting a doctor. This RN went to bedside to assist pt. Pt noted to be flushed and diaphoretic. Pt voiced feeling weird and like my heart is fluttering. Pt EKG noted to have a rhythm change. Dr.Young pulled to bedside by this RN and made aware of the change. A new EKG was obtained and printed off for Dr.Young. Elana Grayer RN made aware of situation as well.

## 2024-04-15 NOTE — Telephone Encounter (Signed)
 Called the after hour answering service about a right sided chest pain/pressure that she has had intermittently this evening. Has also had dizziness and light headedness. Reported having a blood pressure of 91/69. Recommended that the patient call 911 or get someone to take her to the Emergency department. Per chart review she was recently shocked by her ICD about 5 days ago. The patient was agreeable to call emergency sevices.

## 2024-04-15 NOTE — ED Notes (Signed)
 Patient HR went to 170's, MD at bedside.

## 2024-04-15 NOTE — ED Notes (Signed)
 Cardiology at bedside.

## 2024-04-15 NOTE — ED Triage Notes (Signed)
 Patient BIB [facility] for CP, dizziness and near syncope, given 324 ASA en route, patient went into V-tach upon arrival, cardioverted with 200 joules, patient alert and oriented at triage. Hx of pacemaker. Defibrillator, did not feel defib fire today. BP 102/69, O2 98% on 2 LPM via [place].

## 2024-04-16 DIAGNOSIS — E785 Hyperlipidemia, unspecified: Secondary | ICD-10-CM | POA: Diagnosis present

## 2024-04-16 DIAGNOSIS — I951 Orthostatic hypotension: Secondary | ICD-10-CM | POA: Diagnosis present

## 2024-04-16 DIAGNOSIS — D6869 Other thrombophilia: Secondary | ICD-10-CM | POA: Diagnosis present

## 2024-04-16 DIAGNOSIS — Z7901 Long term (current) use of anticoagulants: Secondary | ICD-10-CM | POA: Diagnosis not present

## 2024-04-16 DIAGNOSIS — Z8249 Family history of ischemic heart disease and other diseases of the circulatory system: Secondary | ICD-10-CM | POA: Diagnosis not present

## 2024-04-16 DIAGNOSIS — Z91048 Other nonmedicinal substance allergy status: Secondary | ICD-10-CM | POA: Diagnosis not present

## 2024-04-16 DIAGNOSIS — Z888 Allergy status to other drugs, medicaments and biological substances status: Secondary | ICD-10-CM | POA: Diagnosis not present

## 2024-04-16 DIAGNOSIS — I4901 Ventricular fibrillation: Secondary | ICD-10-CM | POA: Diagnosis present

## 2024-04-16 DIAGNOSIS — Z9221 Personal history of antineoplastic chemotherapy: Secondary | ICD-10-CM | POA: Diagnosis not present

## 2024-04-16 DIAGNOSIS — Z8619 Personal history of other infectious and parasitic diseases: Secondary | ICD-10-CM | POA: Diagnosis not present

## 2024-04-16 DIAGNOSIS — I4891 Unspecified atrial fibrillation: Secondary | ICD-10-CM | POA: Diagnosis not present

## 2024-04-16 DIAGNOSIS — I480 Paroxysmal atrial fibrillation: Secondary | ICD-10-CM | POA: Diagnosis present

## 2024-04-16 DIAGNOSIS — I5022 Chronic systolic (congestive) heart failure: Secondary | ICD-10-CM | POA: Diagnosis present

## 2024-04-16 DIAGNOSIS — I428 Other cardiomyopathies: Secondary | ICD-10-CM | POA: Diagnosis present

## 2024-04-16 DIAGNOSIS — Z853 Personal history of malignant neoplasm of breast: Secondary | ICD-10-CM | POA: Diagnosis not present

## 2024-04-16 DIAGNOSIS — G4733 Obstructive sleep apnea (adult) (pediatric): Secondary | ICD-10-CM | POA: Diagnosis present

## 2024-04-16 DIAGNOSIS — Z95810 Presence of automatic (implantable) cardiac defibrillator: Secondary | ICD-10-CM | POA: Diagnosis not present

## 2024-04-16 DIAGNOSIS — Z83511 Family history of glaucoma: Secondary | ICD-10-CM | POA: Diagnosis not present

## 2024-04-16 DIAGNOSIS — Z79899 Other long term (current) drug therapy: Secondary | ICD-10-CM | POA: Diagnosis not present

## 2024-04-16 DIAGNOSIS — I110 Hypertensive heart disease with heart failure: Secondary | ICD-10-CM | POA: Diagnosis present

## 2024-04-16 DIAGNOSIS — I472 Ventricular tachycardia, unspecified: Secondary | ICD-10-CM | POA: Diagnosis present

## 2024-04-16 DIAGNOSIS — Z86718 Personal history of other venous thrombosis and embolism: Secondary | ICD-10-CM | POA: Diagnosis not present

## 2024-04-16 DIAGNOSIS — E66813 Obesity, class 3: Secondary | ICD-10-CM | POA: Diagnosis present

## 2024-04-16 DIAGNOSIS — Z6841 Body Mass Index (BMI) 40.0 and over, adult: Secondary | ICD-10-CM | POA: Diagnosis not present

## 2024-04-16 DIAGNOSIS — Z923 Personal history of irradiation: Secondary | ICD-10-CM | POA: Diagnosis not present

## 2024-04-16 LAB — LIPID PANEL
Cholesterol: 106 mg/dL (ref 0–200)
HDL: 31 mg/dL — ABNORMAL LOW (ref >40)
LDL Cholesterol: 63 mg/dL (ref 0–99)
Total CHOL/HDL Ratio: 3.4 ratio
Triglycerides: 58 mg/dL (ref <150)
VLDL: 12 mg/dL (ref 0–40)

## 2024-04-16 LAB — BASIC METABOLIC PANEL WITH GFR
Anion gap: 10 (ref 5–15)
BUN: 14 mg/dL (ref 8–23)
CO2: 24 mmol/L (ref 22–32)
Calcium: 8.6 mg/dL — ABNORMAL LOW (ref 8.9–10.3)
Chloride: 102 mmol/L (ref 98–111)
Creatinine, Ser: 1.03 mg/dL — ABNORMAL HIGH (ref 0.44–1.00)
GFR, Estimated: 57 mL/min — ABNORMAL LOW (ref >60)
Glucose, Bld: 108 mg/dL — ABNORMAL HIGH (ref 70–99)
Potassium: 4.9 mmol/L (ref 3.5–5.1)
Sodium: 136 mmol/L (ref 135–145)

## 2024-04-16 LAB — PROTIME-INR
INR: 1.4 — ABNORMAL HIGH (ref 0.8–1.2)
Prothrombin Time: 17.7 s — ABNORMAL HIGH (ref 11.4–15.2)

## 2024-04-16 LAB — CBC
HCT: 37.4 % (ref 36.0–46.0)
Hemoglobin: 12.2 g/dL (ref 12.0–15.0)
MCH: 30.9 pg (ref 26.0–34.0)
MCHC: 32.6 g/dL (ref 30.0–36.0)
MCV: 94.7 fL (ref 80.0–100.0)
Platelets: 135 10*3/uL — ABNORMAL LOW (ref 150–400)
RBC: 3.95 MIL/uL (ref 3.87–5.11)
RDW: 15.4 % (ref 11.5–15.5)
WBC: 9 10*3/uL (ref 4.0–10.5)
nRBC: 0 % (ref 0.0–0.2)

## 2024-04-16 MED ORDER — ACETAMINOPHEN 325 MG PO TABS
650.0000 mg | ORAL_TABLET | ORAL | Status: DC | PRN
Start: 2024-04-16 — End: 2024-04-19
  Administered 2024-04-16 – 2024-04-18 (×2): 650 mg via ORAL
  Filled 2024-04-16 (×2): qty 2

## 2024-04-16 MED ORDER — IVABRADINE 2.5 MG HALF TABLET
2.5000 mg | ORAL_TABLET | Freq: Two times a day (BID) | ORAL | Status: DC
  Administered 2024-04-16 – 2024-04-19 (×7): 2.5 mg via ORAL
  Filled 2024-04-16 (×7): qty 1

## 2024-04-16 MED ORDER — SACUBITRIL-VALSARTAN 24-26 MG PO TABS
1.0000 | ORAL_TABLET | Freq: Two times a day (BID) | ORAL | Status: DC
  Administered 2024-04-16 – 2024-04-19 (×8): 1 via ORAL
  Filled 2024-04-16 (×8): qty 1

## 2024-04-16 MED ORDER — LEVOTHYROXINE SODIUM 50 MCG PO TABS
25.0000 ug | ORAL_TABLET | Freq: Every day | ORAL | Status: DC
  Administered 2024-04-16 – 2024-04-19 (×4): 25 ug via ORAL
  Filled 2024-04-16 (×4): qty 1

## 2024-04-16 MED ORDER — TRAMADOL HCL 50 MG PO TABS
50.0000 mg | ORAL_TABLET | Freq: Four times a day (QID) | ORAL | Status: DC | PRN
  Administered 2024-04-16 – 2024-04-19 (×3): 50 mg via ORAL
  Filled 2024-04-16 (×3): qty 1

## 2024-04-16 MED ORDER — MONTELUKAST SODIUM 10 MG PO TABS
10.0000 mg | ORAL_TABLET | Freq: Every day | ORAL | Status: DC
  Administered 2024-04-16 – 2024-04-18 (×4): 10 mg via ORAL
  Filled 2024-04-16 (×4): qty 1

## 2024-04-16 MED ORDER — DOCUSATE SODIUM 100 MG PO CAPS: 100.0000 mg | ORAL_CAPSULE | Freq: Two times a day (BID) | ORAL | Status: DC | PRN

## 2024-04-16 MED ORDER — DICLOFENAC SODIUM 1 % EX GEL
2.0000 g | Freq: Three times a day (TID) | CUTANEOUS | Status: DC | PRN
  Administered 2024-04-18 – 2024-04-19 (×2): 2 g via TOPICAL
  Filled 2024-04-16: qty 100

## 2024-04-16 MED ORDER — ONDANSETRON HCL 4 MG/2ML IJ SOLN: 4.0000 mg | Freq: Four times a day (QID) | INTRAMUSCULAR | Status: DC | PRN

## 2024-04-16 MED ORDER — PANTOPRAZOLE SODIUM 40 MG PO TBEC
40.0000 mg | DELAYED_RELEASE_TABLET | Freq: Every day | ORAL | Status: DC
  Administered 2024-04-16 – 2024-04-19 (×4): 40 mg via ORAL
  Filled 2024-04-16 (×4): qty 1

## 2024-04-16 MED ORDER — LORATADINE 10 MG PO TABS
10.0000 mg | ORAL_TABLET | Freq: Every day | ORAL | Status: DC
  Administered 2024-04-16 – 2024-04-18 (×3): 10 mg via ORAL
  Filled 2024-04-16 (×3): qty 1

## 2024-04-16 MED ORDER — APIXABAN 5 MG PO TABS
5.0000 mg | ORAL_TABLET | Freq: Two times a day (BID) | ORAL | Status: DC
  Administered 2024-04-16 – 2024-04-19 (×8): 5 mg via ORAL
  Filled 2024-04-16 (×8): qty 1

## 2024-04-16 MED ORDER — ROSUVASTATIN CALCIUM 5 MG PO TABS
5.0000 mg | ORAL_TABLET | Freq: Every day | ORAL | Status: DC
  Administered 2024-04-16 – 2024-04-18 (×4): 5 mg via ORAL
  Filled 2024-04-16 (×4): qty 1

## 2024-04-16 MED ORDER — PAROXETINE HCL 30 MG PO TABS
30.0000 mg | ORAL_TABLET | Freq: Every day | ORAL | Status: DC
  Administered 2024-04-16 – 2024-04-18 (×4): 30 mg via ORAL
  Filled 2024-04-16 (×4): qty 1

## 2024-04-16 MED ORDER — SPIRONOLACTONE 12.5 MG HALF TABLET
12.5000 mg | ORAL_TABLET | Freq: Every day | ORAL | Status: DC
  Administered 2024-04-16 – 2024-04-19 (×4): 12.5 mg via ORAL
  Filled 2024-04-16 (×4): qty 1

## 2024-04-16 MED ORDER — FAMOTIDINE 20 MG PO TABS
20.0000 mg | ORAL_TABLET | Freq: Two times a day (BID) | ORAL | Status: DC
  Administered 2024-04-16 – 2024-04-19 (×8): 20 mg via ORAL
  Filled 2024-04-16 (×8): qty 1

## 2024-04-16 MED ORDER — ALLOPURINOL 300 MG PO TABS
300.0000 mg | ORAL_TABLET | Freq: Every day | ORAL | Status: DC
  Administered 2024-04-16 – 2024-04-19 (×4): 300 mg via ORAL
  Filled 2024-04-16: qty 1
  Filled 2024-04-16: qty 3
  Filled 2024-04-16 (×2): qty 1

## 2024-04-16 NOTE — Plan of Care (Signed)

## 2024-04-16 NOTE — ED Notes (Addendum)
 PA Tillery made aware of patient's BP via secure chat. Per PA Tillery, do not pause amio drip.

## 2024-04-16 NOTE — Significant Event (Signed)
 Rapid Response Event Note   Reason for Call :  Tachycardia-140-160s  Initial Focused Assessment:  Pt lying in bed with eyes open, in no visible distress. She is alert and oriented, c/o some mild SOB/CP/dizziness. Lungs clear t/o. ABD large, soft, NT. Skin warm and dry.   HR-142, BP-120/80, RR-20, SpO2-98 on 2L [place].   A few minutes after exam, HR decreases to 90s(V-paced) and pt says she feels better.   Interventions:  EKG-Afib RVR with premature ventricular or aberrantly conducted complexes, Left bundle branch block Plan of Care:  HR better now. Pt already on Amiod gtt. Continue to monitor pt. Please call RRT if further assistance needed.   Event Summary:   MD Notified: Dr. Ossie Blend Call [phone] Arrival [phone] End Time:2220  Dorrine Gaudy, RN

## 2024-04-16 NOTE — ED Notes (Addendum)
 Tillery, PA at bedside.

## 2024-04-16 NOTE — Consult Note (Addendum)
 ELECTROPHYSIOLOGY CONSULT NOTE    Patient ID: [name] MRN: [id number], DOB/AGE: 73-Mar-1952 73 y.o.  Admit date: 04/15/2024 Date of Consult: 04/16/2024  Primary Physician: Abbe Hoard., MD Primary Cardiologist: None  Electrophysiologist: Dr. Lawana Pray   Referring Provider: Dr. Arthea Larsson  Patient Profile: [name] is a 73 y.o. female with a history of NICM, HFrEF s/p CRT-D , parox afib, HTN, HLD, DVT, type b aortic dissection, breast Ca s/p lumpectomy, XRT, chemo, AF and recent VT with ICD shock who is being seen today for the evaluation of WCT and shock by EMS at the request of Dr. Mallipeddi.  HPI:  [name] is a 73 y.o. female who is well known to EP from a recent admission.   The patient was admitted 6/11 - 6/13 after multiple ICD shocks. Device interrogation revealed AFib w RVR as original arrhythmia, that was treated with ATP that devolved the RVR into VT/VF for which she received ICD shock x 3. She was asymptomatic during entire episode and HV therapies. She was IV amiodarone  loaded. Discharged on po amio load with follow up in place.   Pt was feeling well until evening of 6/17. Developed tachy palpitations and chest discomfort and called EMS. Found to be in a WCT but more consistent with AF RVR. Given ASA and O2. En-route to the hospital developed HRs of up to 200 bpm and lost consciousness, requiring external defibrillator from EMS.   Device was programmed with VF zone starting at 240; so episodes were not captured or treated by her ICD.   IV amiodarone  initiated with labs as below. Pt converted to NSR after approx 2 hours in the ED. Cardiology consulted with EP to see in am.   Today, pt is feeling OK. Denies chest pain this am.  Denies med non-compliance or other symptoms prior to yesterday evening. Reports compliance with her CPAP.   Reviewed [place] driving restrictions x 6 months.   Labs Potassium4.9 (06/17 0415) Magnesium   2.0 (06/16 2238) Creatinine,  ser  1.03* (06/17 0415) PLT  135* (06/17 0415) HGB  12.2 (06/17 0415) WBC 9.0 (06/17 0415) Troponin I (High Sensitivity)21* (06/16 2238).    Past Medical History:  Diagnosis Date   Aortic dissection (HCC) 12/2008   Type B   Arthritis    lower back   Atrial fibrillation (HCC)    post op (left thoracotomy) 11/12 req amio/DCCV   Breast cancer (HCC) 07/2009   Stage 3 lumpectomy, radiation, chemotherapy; finished chemo October, felt to be in remission   Cardiac defibrillator in place    St. Jude (JA) 9/12; LV lead placement unsuccessful;  s/p left thoracotomy with placement of epicardial LV lead 09/2011 (Dr. PVT)   CHF (congestive heart failure) (HCC)    Chronic systolic dysfunction of left ventricle    Depression    DVT (deep venous thrombosis) (HCC) 10/18/2010   GERD (gastroesophageal reflux disease)    Glaucoma    Hiatal hernia    small hiatal hernia   History of shingles    Hyperlipidemia    Hypertension    Left bundle branch block    Nonischemic cardiomyopathy (HCC)    Admission 12/11 with EF 25%, normal coronary arteries by cath 12/11; NICM presumed to be secondary to chemotherapy for breast cancer   Obesity    [name]    with exertion     Surgical History:  Past Surgical History:  Procedure Laterality Date   BIV ICD GENERATOR CHANGEOUT N/A  05/22/2023   Procedure: BIV ICD GENERATOR CHANGEOUT;  Surgeon: Lei Pump, MD;  [name] INVASIVE CV LAB;  Service: Cardiovascular;  Laterality: N/A;   BREAST LUMPECTOMY     Right breast   CARDIAC CATHETERIZATION     2011   CARDIAC DEFIBRILLATOR PLACEMENT     EP IMPLANTABLE DEVICE N/A 01/12/2016   STJ ICD Unify Assura gen change, Dr. Nunzio Belch   OOPHORECTOMY     Single   THORACOTOMY  09/16/2011   Procedure: THORACOTOMY MAJOR;  Surgeon: Jerlyn Moons Trigt III, MD;  Location: [facility] OR;  Service: Thoracic;  Laterality: Left;  left anterolateral Thoracotomy for placement of St. Jude epicardial pacing lead     TONSILLECTOMY     TUBAL LIGATION     Bilateral     (Not in a hospital admission)   Inpatient Medications:   allopurinol   300 mg Oral Daily   apixaban   5 mg Oral BID   famotidine   20 mg Oral BID   ivabradine   2.5 mg Oral BID WC   levothyroxine   25 mcg Oral Q0600   lidocaine   1 patch Transdermal Once   loratadine  10 mg Oral Daily   montelukast   10 mg Oral QHS   pantoprazole   40 mg Oral Daily   PARoxetine   30 mg Oral QHS   rosuvastatin   5 mg Oral QHS   sacubitril -valsartan   1 tablet Oral BID   spironolactone   12.5 mg Oral Daily    Allergies:  Allergies  Allergen Reactions   Wound Dressing Adhesive Other (See Comments)    Blistering of the skin;    Alphagan [Brimonidine]     Irritation    Mobic [Meloxicam] Other (See Comments)    Cannot take due to heart issue   Nsaids Other (See Comments)    Cannot take due to heart issue   Polyvinyl Alcohol Other (See Comments)    Travatan and Alphagan(irritation)   Statins Other (See Comments)    Per pt her cardiologist told her not to take   Tolmetin Other (See Comments)    Can not take due to heart issue   Travatan Z [Travoprost (Bak Free)]     Irritation   Tape Rash    tape Blister from clear tapes.    Family History  Problem Relation Age of Onset   Hypertension Mother    Glaucoma Mother    Atrial fibrillation Mother    Hypertension Father    Hypertension Brother    Hypertension Maternal Grandfather    Heart attack Maternal Grandfather        MI   Coronary artery disease Maternal Grandfather    Hypertension Paternal Grandfather    Heart attack Paternal Grandfather        MI   Coronary artery disease Paternal Grandfather    Hypertension Brother      Physical Exam: Vitals:   04/16/24 0715 04/16/24 0730 04/16/24 0745 04/16/24 0815  BP: 94/67 (!) 96/59 104/89 (!) 85/68  Pulse: 72 63 66 68  Resp: 16 (!) 30 19 16   Temp: 98.1 F (36.7 C)     TempSrc: Oral     SpO2: 98% 98% 99% 97%  Weight:      Height:         GEN- NAD, A&O x 3, normal affect HEENT: Normocephalic, atraumatic Lungs- CTAB, Normal effort.  Heart- Regular rate and rhythm, No M/G/R.  GI- Soft, NT, ND.  Extremities- No clubbing, cyanosis, or edema   Radiology/Studies: DG Chest Portable  1 View Result Date: 04/15/2024 CLINICAL DATA:  Near syncope chest pain EXAM: PORTABLE CHEST 1 VIEW COMPARISON:  04/10/2024 FINDINGS: Temporary pacing patches obscure the chest. Left-sided pacing device as before. Epicardial leads. Coiled leads over left chest. Cardiomegaly. No acute airspace disease, pleural effusion or pneumothorax. IMPRESSION: No active disease. Cardiomegaly. Electronically Signed   By: Esmeralda Hedge M.D.   On: 04/15/2024 21:42   CT Angio Chest PE W and/or Wo Contrast Result Date: 04/10/2024 EXAMINATION: CTA CHEST PE CLINICAL INDICATION: Pulmonary embolism (PE) suspected, low to intermediate prob, positive D-dimer TECHNIQUE: This examination was performed according to an angiographic protocol with 3D post-processing. This involves 3D reconstructions, MIPs, volume rendered images and/or shaded surface rendering. One or more of the following dose reduction techniques were used: Automated exposure control, adjustment of the mA and/or kV according to patient size, and/or iterative reconstruction. Unless otherwise specified, incidental findings do not require dedicated imaging follow-up. COMPARISON: 10/05/2018 FINDINGS: The pulmonary arteries are well-opacified. No intraluminal filling defects are identified. There is no thoracic aortic aneurysm. Marked left ventricular enlargement is present. A left pericardial cyst is present which does not require additional follow-up. No pericardial effusion is present. There is no pleural effusion. No adenopathy is identified in the chest. The thyroid  gland has a normal appearance. The esophagus is within normal limits. A small sliding hiatal hernia is present. Mild right apical groundglass attenuation is  present, nonspecific. Clustered micronodules are present in the right lower lobe, consistent with old inflammatory disease. The left lung is clear. A 2 cm rounded area of low attenuation is present in the spleen, not present previously. There is no acute fracture or destructive process. IMPRESSION: 1. No evidence of pulmonary embolism. 2. Development of splenic mass as described above. Dedicated splenic CT or MRI scanning is recommended for further characterization. Electronically signed by: Buster Cash MD 04/10/2024 04:37 PM EDT RP Workstation: [id number]   DG Chest 2 View Result Date: 04/10/2024 CLINICAL DATA:  Defibrillator malfunction. EXAM: CHEST - 2 VIEW COMPARISON:  April 05, 2024. FINDINGS: Stable cardiomegaly. Left-sided defibrillator is unchanged. Lungs are clear. Bony thorax is unremarkable. IMPRESSION: No active cardiopulmonary disease. Electronically Signed   By: Rosalene Colon M.D.   On: 04/10/2024 12:51   ECHOCARDIOGRAM COMPLETE Result Date: 04/06/2024    ECHOCARDIOGRAM REPORT   Patient Name:   SHAKYA SEBRING Date of Exam: 04/06/2024 Medical Rec #:  [id number]      Height:       62.0 in Accession #:    [id number]     Weight:       261.0 lb Date of Birth:  [birth year]      BSA:          2.141 m Patient Age:    73 years       BP:           126/82 mmHg Patient Gender: F              HR:           80 bpm. Exam Location:  Inpatient Procedure: 2D Echo, Cardiac Doppler, Color Doppler and Intracardiac            Opacification Agent (Both Spectral and Color Flow Doppler were            utilized during procedure). Indications:    Near Syncope  History:        Patient has prior history of Echocardiogram examinations, most  recent 09/01/2022. CHF and Nonischemic Cardiomyopathy, Aortic                 Dissection and DVT, Arrythmias:Atrial Fibrillation,                 Signs/Symptoms:[name]; Risk Factors:Dyslipidemia                 and Hypertension.  Sonographer:    Travis Friedman RDCS  Referring Phys: [id number] Juliette Oh  Sonographer Comments: Patient is obese. IMPRESSIONS  1. No mural apical thrombus with definity . Left ventricular ejection fraction, by estimation, is <20%. The left ventricle has severely decreased function. The left ventricle demonstrates global hypokinesis. The left ventricular internal cavity size was severely dilated. Left ventricular diastolic parameters are indeterminate.  2. Device lead in RV. Right ventricular systolic function is normal. The right ventricular size is normal.  3. Left atrial size was moderately dilated.  4. Functional MR from LV dysfunction and reduced posterior leaflet motion apically displaced coaptation point. The mitral valve is abnormal. Mild to moderate mitral valve regurgitation. No evidence of mitral stenosis.  5. Mean gradient 4 peak 9 mmHg likely mild low flow AS . The aortic valve is tricuspid. There is mild calcification of the aortic valve. There is mild thickening of the aortic valve. Aortic valve regurgitation is not visualized. Aortic valve sclerosis is present, with no evidence of aortic valve stenosis.  6. The inferior vena cava is normal in size with greater than 50% respiratory variability, suggesting right atrial pressure of 3 mmHg. FINDINGS  Left Ventricle: No mural apical thrombus with definity . Left ventricular ejection fraction, by estimation, is <20%. The left ventricle has severely decreased function. The left ventricle demonstrates global hypokinesis. Strain was performed and the global longitudinal strain is indeterminate. The left ventricular internal cavity size was severely dilated. There is no left ventricular hypertrophy. Left ventricular diastolic parameters are indeterminate. Right Ventricle: Device lead in RV. The right ventricular size is normal. No increase in right ventricular wall thickness. Right ventricular systolic function is normal. Left Atrium: Left atrial size was moderately dilated. Right Atrium:  Right atrial size was normal in size. Pericardium: There is no evidence of pericardial effusion. Mitral Valve: Functional MR from LV dysfunction and reduced posterior leaflet motion apically displaced coaptation point. The mitral valve is abnormal. There is mild thickening of the mitral valve leaflet(s). There is mild calcification of the mitral valve leaflet(s). Mild to moderate mitral valve regurgitation. No evidence of mitral valve stenosis. Tricuspid Valve: The tricuspid valve is normal in structure. Tricuspid valve regurgitation is mild . No evidence of tricuspid stenosis. Aortic Valve: Mean gradient 4 peak 9 mmHg likely mild low flow AS. The aortic valve is tricuspid. There is mild calcification of the aortic valve. There is mild thickening of the aortic valve. Aortic valve regurgitation is not visualized. Aortic valve sclerosis is present, with no evidence of aortic valve stenosis. Aortic valve mean gradient measures 4.0 mmHg. Aortic valve peak gradient measures 8.5 mmHg. Aortic valve area, by VTI measures 1.14 cm. Pulmonic Valve: The pulmonic valve was normal in structure. Pulmonic valve regurgitation is not visualized. No evidence of pulmonic stenosis. Aorta: The aortic root is normal in size and structure. Venous: The inferior vena cava is normal in size with greater than 50% respiratory variability, suggesting right atrial pressure of 3 mmHg. IAS/Shunts: No atrial level shunt detected by color flow Doppler. Additional Comments: 3D was performed not requiring image post processing on an independent workstation  and was indeterminate.  LEFT VENTRICLE PLAX 2D LVIDd:         7.30 cm LVIDs:         6.75 cm LV PW:         0.90 cm LV IVS:        1.05 cm LVOT diam:     2.40 cm LV SV:         36 LV SV Index:   17 LVOT Area:     4.52 cm  LV Volumes (MOD) LV vol d, MOD A2C: 515.0 ml LV vol d, MOD A4C: 519.0 ml LV vol s, MOD A2C: 430.0 ml LV vol s, MOD A4C: 430.0 ml LV SV MOD A2C:     85.0 ml LV SV MOD A4C:      519.0 ml LV SV MOD BP:      82.1 ml RIGHT VENTRICLE          IVC RV Basal diam:  3.50 cm  IVC diam: 2.10 cm TAPSE (M-mode): 1.1 cm LEFT ATRIUM              Index        RIGHT ATRIUM           Index LA diam:        4.30 cm  2.01 cm/m   RA Area:     10.80 cm LA Vol (A2C):   85.9 ml  40.13 ml/m  RA Volume:   20.50 ml  9.58 ml/m LA Vol (A4C):   121.0 ml 56.52 ml/m LA Biplane Vol: 102.0 ml 47.65 ml/m  AORTIC VALVE AV Area (Vmax):    1.50 cm AV Area (Vmean):   1.50 cm AV Area (VTI):     1.14 cm AV Vmax:           146.00 cm/s AV Vmean:          96.700 cm/s AV VTI:            0.315 m AV Peak Grad:      8.5 mmHg AV Mean Grad:      4.0 mmHg LVOT Vmax:         48.50 cm/s LVOT Vmean:        32.000 cm/s LVOT VTI:          0.080 m LVOT/AV VTI ratio: 0.25  AORTA Ao Root diam: 3.50 cm Ao Asc diam:  2.90 cm  SHUNTS Systemic VTI:  0.08 m Systemic Diam: 2.40 cm Janelle Mediate MD Electronically signed by Janelle Mediate MD Signature Date/Time: 04/06/2024/12:27:47 PM    Final    DG Chest Port 1 View Result Date: 04/05/2024 CLINICAL DATA:  [name]. EXAM: PORTABLE CHEST 1 VIEW COMPARISON:  01/10/2019. FINDINGS: Stable cardiomegaly and left ventricular prominence. Left-sided pacemaker/AICD in place. Mild left basilar opacity could reflect overlying soft tissue, atelectasis, or infiltrate. The right lung is clear. No sizable pleural effusion or pneumothorax. Surgical clips overlying the right breast. No acute osseous abnormality. IMPRESSION: Stable cardiomegaly. Mild left basilar opacity could reflect overlying soft tissue, atelectasis, or infiltrate. Electronically Signed   By: Mannie Seek M.D.   On: 04/05/2024 15:52    EKG:on arrival showed likely AF with RVR and aberrancy at 147 bpm (personally reviewed)  Rhythm with LOC with EMS   TELEMETRY: Converted to NSR ~ 2315 last night and has maintained since. HRs in 60-80s (personally reviewed)  DEVICE HISTORY: St. Jude CRT-D, imp 07/2011  LV lead added 10/2011;  Gen change 05/2023  Assessment/Plan:  VT with external defibrillation Suspect tachy-induced-tachy  Previously, therapies delivered for AF RVR caused pt to develop VT, but with rates > 240 cannot rule out dual tachycardia Continue IV amiodarone  load likely through tomorrow Not on BB with soft pressures.   Device reprogrammed for  VT1 - Monitor at 150 bpm VT2 - 184 bpm, ATP,ATP,36J,40Jx2 VF - 222 bpm - ATP during charging, shock x 5  AF with RVR Continue eliquis  5 mg BID Continue amiodarone  as above  Chronic systolic CHF Echo 04/06/2024 LVEF <20% Continue GDMT as tolerated  Not on BB with soft pressures.  Continue spiro, entresto , and ivabradine  as tolerated  MD to see.   For questions or updates, please contact [facility] Please consult [email or web address] for contact info under     Signed, Tylene Galla, PA-C  04/16/2024, 8:38 AM

## 2024-04-16 NOTE — ED Notes (Signed)
 Tillery, PA consulted about pt's BP and HR, will continue Ivabradine .

## 2024-04-16 NOTE — Progress Notes (Signed)
 Pt went into afib w/rvr HR 130-160 while sitting on a bedside commode. Pt assisted back in bed. Pt c/o mild pain 2/10 to her chest and some dizziness. Pt placed on 2 LNC. Charge nurse, Pattie Borders, and RRT, Mindy,  notified. STAT EKG obtained. Cardiologist, Dr. Ossie Blend, notified. Will continue to monitor.

## 2024-04-16 NOTE — ED Notes (Signed)
 Monitor tech called, states that pt had a 6 beat run of v tach, printed strip, pt appears to have had a 6 beat run, denies significant pain, reports slight pain in chest and R shoulder, cards pa notified.

## 2024-04-16 NOTE — H&P (Signed)
 Cardiology Admission History and Physical   Patient ID: [name] MRN: [id number]; DOB: 09/27/1951   Admission date: 04/15/2024  PCP:  Abbe Hoard., MD   [facility] Providers Cardiologist:  None  Electrophysiologist:  Will Cortland Ding, MD  Advanced Heart Failure:  Jules Oar, MD      Chief Complaint:  palpitations  Patient Profile: [name] is a 73 y.o. female with NICM, HFrEF s/p CRT-D , parox afib, HTN, HLD, DVT, type b aortic dissection, breast Ca s/p lumpectomy, XRT, chemo  who is being seen 04/16/2024 for the evaluation of afib with RVR and VT with ICD shocks.  History of Present Illness: [name] has above history and was just recently discharged from the hospital on 04/12/2024 for hospitalization after A-fib with RVR and an episode of VT was shocked.  Had been continuing oral amiodarone  at home and in stable condition.  Notable labs.  Conditions developed some swelling.  And she became symptomatic yesterday evening called EMS.  On route was noted to be low so Tubifast VTE.  She received 1 shot for this via EMS.  On arrival she was in a wide-complex tachycardia that appeared to be atrial for.  Her heart rate was in the 140s.  She was started on IV amiodarone  again and ultimately heart rate reverted to sinus rhythm was paced.   Past Medical History:  Diagnosis Date   Aortic dissection (HCC) 12/2008   Type B   Arthritis    lower back   Atrial fibrillation (HCC)    post op (left thoracotomy) 11/12 req amio/DCCV   Breast cancer (HCC) 07/2009   Stage 3 lumpectomy, radiation, chemotherapy; finished chemo October, felt to be in remission   Cardiac defibrillator in place    St. Jude (JA) 9/12; LV lead placement unsuccessful;  s/p left thoracotomy with placement of epicardial LV lead 09/2011 (Dr. PVT)   CHF (congestive heart failure) (HCC)    Chronic systolic dysfunction of left ventricle    Depression    DVT (deep venous thrombosis) (HCC)  10/18/2010   GERD (gastroesophageal reflux disease)    Glaucoma    Hiatal hernia    small hiatal hernia   History of shingles    Hyperlipidemia    Hypertension    Left bundle branch block    Nonischemic cardiomyopathy (HCC)    Admission 12/11 with EF 25%, normal coronary arteries by cath 12/11; NICM presumed to be secondary to chemotherapy for breast cancer   Obesity    [name]    with exertion   Past Surgical History:  Procedure Laterality Date   BIV ICD GENERATOR CHANGEOUT N/A 05/22/2023   Procedure: BIV ICD GENERATOR CHANGEOUT;  Surgeon: Lei Pump, MD;  Location: [facility] INVASIVE CV LAB;  Service: Cardiovascular;  Laterality: N/A;   BREAST LUMPECTOMY     Right breast   CARDIAC CATHETERIZATION     2011   CARDIAC DEFIBRILLATOR PLACEMENT     EP IMPLANTABLE DEVICE N/A 01/12/2016   STJ ICD Unify Assura gen change, Dr. Nunzio Belch   OOPHORECTOMY     Single   THORACOTOMY  09/16/2011   Procedure: THORACOTOMY MAJOR;  Surgeon: Shawn Delay, MD;  Location: [facility] OR;  Service: Thoracic;  Laterality: Left;  left anterolateral Thoracotomy for placement of St. Jude epicardial pacing lead    TONSILLECTOMY     TUBAL LIGATION     Bilateral     Medications Prior to Admission: Prior to Admission medications  Medication Sig Start Date End Date Taking? Authorizing Provider  acetaminophen  (TYLENOL ) 500 MG tablet Take 1,000 mg by mouth every 6 (six) hours as needed for mild pain (pain score 1-3), moderate pain (pain score 4-6) or headache.   Yes [provider]  allopurinol  (ZYLOPRIM ) 300 MG tablet Take 300 mg by mouth daily.   Yes [provider]  amiodarone  (PACERONE ) 200 MG tablet Take 2 tablets by mouth twice a day (AM and PM) for 2 weeks, then reduce to 1 tablet once a day 04/12/24  Yes Riddle, Suzann, NP  apixaban  (ELIQUIS ) 5 MG TABS tablet Take 1 tablet (5 mg total) by mouth 2 (two) times daily. 04/05/24  Yes Nathanel Bal, PA-C  bimatoprost  (LUMIGAN )  0.03 % ophthalmic solution Place 1 drop into both eyes at bedtime. Reported on 01/12/2016   Yes [provider]  Calcium  Carbonate-Vitamin D (CALCIUM -VITAMIN D3) 600-200 MG-UNIT TABS Take 1 tablet by mouth 2 (two) times daily.   Yes [provider]  cetirizine (ZYRTEC) 10 MG tablet Take 10 mg by mouth at bedtime. 04/08/19  Yes [provider]  Cholecalciferol (VITAMIN D) 125 MCG (5000 UT) CAPS Take 5,000 Units by mouth daily.   Yes [provider]  clotrimazole-betamethasone (LOTRISONE) cream Apply 1 Application topically 2 (two) times daily as needed (Rash).   Yes [provider]  colchicine  0.6 MG tablet Take 0.6 mg by mouth daily as needed (Gout flare up).   Yes [provider]  cyanocobalamin (VITAMIN B12) 1000 MCG tablet Take 1,000 mcg by mouth daily.   Yes [provider]  cyclobenzaprine  (FLEXERIL ) 10 MG tablet Take 1 tablet (10 mg total) by mouth 3 (three) times daily as needed for muscle spasms. 12/27/13  Yes Bensimhon, Rheta Celestine, MD  diclofenac  sodium (VOLTAREN ) 1 % GEL APPLY THREE GRAMS TO THREE LARGE JOINTS UP TO THREE TIMES DAILY AS NEEDED Patient taking differently: Apply 4 g topically 2 (two) times daily. APPLY THREE GRAMS TO THREE LARGE JOINTS UP TO THREE TIMES DAILY AS NEEDED 04/04/18  Yes Romayne Clubs, PA-C  docusate sodium  (COLACE) 100 MG capsule Take 100 mg by mouth 2 (two) times daily as needed for mild constipation.   Yes [provider]  famotidine  (PEPCID ) 20 MG tablet Take 20 mg by mouth 2 (two) times daily.   Yes [provider]  ivabradine  ([name]) 5 MG TABS tablet Take 0.5 tablets (2.5 mg total) by mouth 2 (two) times daily. If hr greater than 70 07/11/23  Yes Bensimhon, Rheta Celestine, MD  levothyroxine  (SYNTHROID ) 25 MCG tablet Take 25 mcg by mouth daily. 01/15/20  Yes [provider]  montelukast  (SINGULAIR ) 10 MG tablet Take 10 mg by mouth at bedtime. For allergies & congestion   Yes [provider]  Multiple Vitamins-Minerals (OCUVITE PO) Take 5 mg by mouth daily.   Yes [provider]  nystatin  (MYCOSTATIN /NYSTOP ) powder Apply topically 2 (two) times daily. 04/08/24  Yes Gherghe, Costin M, MD  omeprazole  (PRILOSEC) 40 MG capsule TAKE ONE CAPSULE BY MOUTH TWICE DAILY 08/04/20  Yes Kozlow, Eric J, MD  PARoxetine  (PAXIL ) 30 MG tablet Take 30 mg by mouth at bedtime. 02/10/16  Yes [provider]  Polyethyl Glycol-Propyl Glycol (SYSTANE OP) Place 1 drop into both eyes daily as needed (dry eyes).   Yes [provider]  rosuvastatin  (CRESTOR ) 5 MG tablet TAKE ONE TABLET BY MOUTH EVERY EVENING Patient taking differently: Take 5 mg by mouth at bedtime. 11/07/23  Yes  Bensimhon, Rheta Celestine, MD  sacubitril -valsartan  (ENTRESTO ) 24-26 MG Take 1 tablet by mouth 2 (two) times daily.   Yes [provider]  sodium fluoride (DENTA 5000 PLUS) 1.1 % CREA dental cream Place 1 Application onto teeth 2 (two) times daily.   Yes [provider]  spironolactone  (ALDACTONE ) 25 MG tablet Take 0.5 tablets (12.5 mg total) by mouth daily. 04/09/24  Yes Bensimhon, Rheta Celestine, MD  traMADol  (ULTRAM ) 50 MG tablet Take 50 mg by mouth every 6 (six) hours as needed for pain.   Yes [provider]  vitamin E 1000 UNIT capsule Take 1,000 Units by mouth daily.   Yes [provider]  ranitidine (ZANTAC) 300 MG capsule Take 300 mg by mouth daily as needed. Acid indigestion  01/24/12  [provider]     Allergies:    Allergies  Allergen Reactions   Wound Dressing Adhesive Other (See Comments)    Blistering of the skin;    Alphagan [Brimonidine]     Irritation    Mobic [Meloxicam] Other (See Comments)    Cannot take due to heart issue   Nsaids Other (See Comments)    Cannot take due to heart issue   Polyvinyl Alcohol Other (See Comments)    Travatan and Alphagan(irritation)   Statins Other (See Comments)    Per pt her cardiologist told her not to  take   Tolmetin Other (See Comments)    Can not take due to heart issue   Travatan Z [Travoprost (Bak Free)]     Irritation   Tape Rash    tape Blister from clear tapes.    Social History:   Social History   Socioeconomic History   Marital status: Married    Spouse name: Not on file   Number of children: Not on file   Years of education: Not on file   Highest education level: Not on file  Occupational History   Occupation: Radio producer: [employer]    Occupation: Retired from Photographer  Tobacco Use   Smoking status: Never   Smokeless tobacco: Never  Vaping Use   Vaping status: Never Used  Substance and Sexual Activity   Alcohol use: No   Drug use: No   Sexual activity: Not on file  Other Topics Concern   Not on file  Social History Narrative   Married   One child   Lives in [place] with spouse and mother in law   Previously worked as a Public librarian for [employer]      She is a Social worker counsel member.   Social Drivers of Corporate investment banker Strain: Not on file  Food Insecurity: No Food Insecurity (04/11/2024)   Hunger Vital Sign    Worried About Running Out of Food in the Last Year: Never true    Ran Out of Food in the Last Year: Never true  Transportation Needs: No Transportation Needs (04/11/2024)   PRAPARE - Administrator, Civil Service (Medical): No    Lack of Transportation (Non-Medical): No  Physical Activity: Not on file  Stress: Not on file  Social Connections: Unknown (04/11/2024)   Social Connection and Isolation Panel    Frequency of Communication with Friends and Family: More than three times a week    Frequency of Social Gatherings with Friends and Family: Once a week    Attends Religious Services: Not on Marketing executive or Organizations:  Yes    Attends Club or Organization Meetings: 1 to 4 times per year    Marital Status: Married  Intimate Partner Violence: Unknown (04/11/2024)    Humiliation, Afraid, Rape, and Kick questionnaire    Fear of Current or Ex-Partner: No    Emotionally Abused: No    Physically Abused: Not on file    Sexually Abused: Not on file     Family History:   The patient's family history includes Atrial fibrillation in her mother; Coronary artery disease in her maternal grandfather and paternal grandfather; Glaucoma in her mother; Heart attack in her maternal grandfather and paternal grandfather; Hypertension in her brother, brother, father, maternal grandfather, mother, and paternal grandfather.    ROS:  Please see the history of present illness.  All other ROS reviewed and negative.     Physical Exam/Data: Vitals:   04/16/24 0000 04/16/24 0015 04/16/24 0029 04/16/24 0030  BP: 103/60   123/72  Pulse: 76 74  76  Resp: 14 19  18   Temp:   98.4 F (36.9 C)   TempSrc:   Oral   SpO2: 98% 98%  98%  Weight:      Height:        Intake/Output Summary (Last 24 hours) at 04/16/2024 0122 Last data filed at 04/15/2024 2233 Gross per 24 hour  Intake 500 ml  Output --  Net 500 ml      04/15/2024    8:58 PM 04/12/2024    3:39 AM 04/11/2024    7:38 AM  Last 3 Weights  Weight (lbs) 262 lb 5.6 oz 262 lb 5.6 oz 262 lb 2 oz  Weight (kg) 119 kg 119 kg 118.9 kg     Body mass index is 49.57 kg/m.  General:  Well nourished, well developed, in no acute distress HEENT: normal Neck: Mild JVD Vascular: No carotid bruits; Distal pulses 2+ bilaterally   Cardiac:  normal S1, S2; RRR; soft systolic murmur Lungs:  clear to auscultation bilaterally, no wheezing, rhonchi or rales  Abd: soft, nontender, no hepatomegaly  Ext: 1+ edema bilaterally Musculoskeletal:  No deformities, BUE and BLE strength normal and equal Skin: warm and dry  Neuro:  CNs 2-12 intact, no focal abnormalities noted Psych:  Normal affect   EKG:  The ECG that was done  was personally reviewed and demonstrates A-fib  Relevant CV Studies: None  Laboratory Data: High Sensitivity  Troponin:   Recent Labs  Lab 04/05/24 2010 04/10/24 1153 04/10/24 1336 04/10/24 1617 04/15/24 2238  TROPONINIHS 57* 208* 209* 178* 21*      Chemistry Recent Labs  Lab 04/11/24 0317 04/15/24 2238  NA 131* 134*  K 3.9 4.3  CL 93* 105  CO2 23 19*  GLUCOSE 392* 116*  BUN 18 12  CREATININE 1.16* 1.05*  CALCIUM  8.4* 8.5*  MG 1.9 2.0  GFRNONAA 50* 56*  ANIONGAP 15 10    Recent Labs  Lab 04/11/24 0317  PROT 5.9*  ALBUMIN 2.6*  AST 19  ALT 11  ALKPHOS 70  BILITOT 0.6   Lipids No results for input(s): CHOL, TRIG, HDL, LABVLDL, LDLCALC, CHOLHDL in the last 168 hours. Hematology Recent Labs  Lab 04/10/24 1153 04/15/24 2125  WBC 6.5 10.2  RBC 4.28 4.40  HGB 13.4 13.6  HCT 40.6 41.7  MCV 94.9 94.8  MCH 31.3 30.9  MCHC 33.0 32.6  RDW 15.1 15.4  PLT 130* 141*   Thyroid   Recent Labs  Lab 04/11/24 0317  TSH 3.160  BNP Recent Labs  Lab 04/10/24 1336  BNP 314.1*    DDimer  Recent Labs  Lab 04/10/24 1336  DDIMER 4.32*    Radiology/Studies:  DG Chest Portable 1 View Result Date: 04/15/2024 CLINICAL DATA:  Near syncope chest pain EXAM: PORTABLE CHEST 1 VIEW COMPARISON:  04/10/2024 FINDINGS: Temporary pacing patches obscure the chest. Left-sided pacing device as before. Epicardial leads. Coiled leads over left chest. Cardiomegaly. No acute airspace disease, pleural effusion or pneumothorax. IMPRESSION: No active disease. Cardiomegaly. Electronically Signed   By: Esmeralda Hedge M.D.   On: 04/15/2024 21:42     Assessment and Plan: CERYS WINGET is a 73 y.o. female with PMH of NICM, HFrEF s/p CRT-D , parox afib, HTN, HLD, DVT, type b aortic dissection, breast Ca s/p lumpectomy, XRT, chemo admitted for AFib w RVR with ICD shocks.    #) AFib w RVR #) HFrEF, thought d/t chemotherapy #) St. Jude dual chamber ICD #) multiple ICD shocks #) HTN #) orthostatic hypotension   -Arrhythmia has now become quiescent on IV amiodarone  again.  Thus we will  continue IV amiodarone  as she was superbly loaded at home p.o.  Will need to complete full 5 g load with IV infusion to see if this helps.  Will also need to interrogate pacemaker to understand what happened this time.  Otherwise she seems in the same state #2 was on discharge.  -Continue home GDMT  -Continue home Eliquis  for stroke prophylaxis  Risk Assessment/Risk Scores:      New [place]  Heart Association (NYHA) Functional Class NYHA Class III  CHA2DS2-VASc Score = 4   This indicates a 4.8% annual risk of stroke. The patient's score is based upon: CHF History: 1 HTN History: 1 Diabetes History: 0 Stroke History: 0 Vascular Disease History: 0 Age Score: 1 Gender Score: 1    Code Status: Full Code  Severity of Illness: The appropriate patient status for this patient is INPATIENT. Inpatient status is judged to be reasonable and necessary in order to provide the required intensity of service to ensure the patient's safety. The patient's presenting symptoms, physical exam findings, and initial radiographic and laboratory data in the context of their chronic comorbidities is felt to place them at high risk for further clinical deterioration. Furthermore, it is not anticipated that the patient will be medically stable for discharge from the hospital within 2 midnights of admission.   * I certify that at the point of admission it is my clinical judgment that the patient will require inpatient hospital care spanning beyond 2 midnights from the point of admission due to high intensity of service, high risk for further deterioration and high frequency of surveillance required.*  For questions or updates, please contact [facility] Please consult [email or web address] for contact info under     Signed, Bert Britain, MD  04/16/2024 1:22 AM

## 2024-04-16 NOTE — Plan of Care (Signed)
  Problem: Education: Goal: Knowledge of General Education information will improve Description: Including pain rating scale, medication(s)/side effects and non-pharmacologic comfort measures Outcome: Progressing   Problem: Health Behavior/Discharge Planning: Goal: Ability to manage health-related needs will improve Outcome: Progressing   Problem: Clinical Measurements: Goal: Ability to maintain clinical measurements within normal limits will improve Outcome: Progressing Goal: Will remain free from infection Outcome: Progressing Goal: Diagnostic test results will improve Outcome: Progressing Goal: Respiratory complications will improve Outcome: Progressing Goal: Cardiovascular complication will be avoided Outcome: Progressing   Problem: Activity: Goal: Risk for activity intolerance will decrease Outcome: Progressing   Problem: Nutrition: Goal: Adequate nutrition will be maintained Outcome: Progressing   Problem: Coping: Goal: Level of anxiety will decrease Outcome: Progressing   Problem: Elimination: Goal: Will not experience complications related to bowel motility Outcome: Progressing Goal: Will not experience complications related to urinary retention Outcome: Progressing   Problem: Pain Managment: Goal: General experience of comfort will improve and/or be controlled Outcome: Progressing   Problem: Safety: Goal: Ability to remain free from injury will improve Outcome: Progressing   Problem: Skin Integrity: Goal: Risk for impaired skin integrity will decrease Outcome: Progressing   Problem: Education: Goal: Knowledge of disease or condition will improve Outcome: Progressing Goal: Understanding of medication regimen will improve Outcome: Progressing   Problem: Cardiac: Goal: Ability to achieve and maintain adequate cardiopulmonary perfusion will improve Outcome: Progressing

## 2024-04-16 NOTE — ED Notes (Signed)
 Patient repositioned in bed, states no other needs at this time.

## 2024-04-17 DIAGNOSIS — I5022 Chronic systolic (congestive) heart failure: Secondary | ICD-10-CM | POA: Diagnosis not present

## 2024-04-17 DIAGNOSIS — I472 Ventricular tachycardia, unspecified: Secondary | ICD-10-CM | POA: Diagnosis not present

## 2024-04-17 DIAGNOSIS — I4891 Unspecified atrial fibrillation: Secondary | ICD-10-CM | POA: Diagnosis not present

## 2024-04-17 LAB — BASIC METABOLIC PANEL WITH GFR
Anion gap: 7 (ref 5–15)
BUN: 13 mg/dL (ref 8–23)
CO2: 25 mmol/L (ref 22–32)
Calcium: 8.7 mg/dL — ABNORMAL LOW (ref 8.9–10.3)
Chloride: 102 mmol/L (ref 98–111)
Creatinine, Ser: 1.03 mg/dL — ABNORMAL HIGH (ref 0.44–1.00)
GFR, Estimated: 57 mL/min — ABNORMAL LOW (ref >60)
Glucose, Bld: 98 mg/dL (ref 70–99)
Potassium: 4.5 mmol/L (ref 3.5–5.1)
Sodium: 134 mmol/L — ABNORMAL LOW (ref 135–145)

## 2024-04-17 LAB — MAGNESIUM: Magnesium: 2.3 mg/dL (ref 1.7–2.4)

## 2024-04-17 MED ORDER — METOPROLOL SUCCINATE ER 25 MG PO TB24
25.0000 mg | ORAL_TABLET | Freq: Every day | ORAL | Status: DC
  Administered 2024-04-17 – 2024-04-19 (×3): 25 mg via ORAL
  Filled 2024-04-17 (×3): qty 1

## 2024-04-17 NOTE — Progress Notes (Addendum)
 Patient Name: [name] Date of Encounter: 04/17/2024  Primary Cardiologist: None Electrophysiologist: Elyan Vanwieren Cortland Ding, MD  Interval Summary   The patient is doing well today. Going in and out of AF.   Vital Signs    Vitals:   04/17/24 0022 04/17/24 0040 04/17/24 0333 04/17/24 0741  BP: (!) 141/106  113/71 113/60  Pulse: 79 74 70 74  Resp: 19  18 16   Temp: 98 F (36.7 C)  97.7 F (36.5 C) 97.6 F (36.4 C)  TempSrc: Oral  Oral Oral  SpO2: 95% 96% 98% 100%  Weight:      Height:        Intake/Output Summary (Last 24 hours) at 04/17/2024 0751 Last data filed at 04/17/2024 0318 Gross per 24 hour  Intake 746.47 ml  Output 0 ml  Net 746.47 ml   Filed Weights   04/15/24 2058 04/16/24 1543  Weight: 119 kg 117 kg    Physical Exam    GEN- The patient is well appearing, alert and oriented x 3 today.   Lungs- Clear to ausculation bilaterally, normal work of breathing Cardiac- Irregularly irregular rate and rhythm, no murmurs, rubs or gallops GI- soft, NT, ND, + BS Extremities- no clubbing or cyanosis. No edema  Telemetry    Paroxysms of AF and NSR 70s-130s (personally reviewed)  Hospital Course    [name] is a 73 y.o. female with a history of NICM, HFrEF s/p CRT-D , parox afib, HTN, HLD, DVT, type b aortic dissection, breast Ca s/p lumpectomy, XRT, chemo, AF and recent VT with ICD shock who is being seen today for the evaluation of WCT and shock by EMS at the request of Dr. Mallipeddi.   Assessment & Plan    VT with external defibrillation Suspect tachy-induced-tachy  Previously, therapies delivered for AF RVR caused pt to develop VT, but with rates > 240 cannot rule out dual tachycardia Continue IV amiodarone  today and then to po.  Damesha Lawler add low dose toprol  and follow pressures.   Device reprogrammed for  VT1 - Monitor at 150 bpm VT2 - 184 bpm, ATP,ATP,36J,40Jx2 VF - 222 bpm - ATP during charging, shock x 5   AF with RVR In and out of  rhythm Continue eliquis  5 mg BID Continue amiodarone  as above If continues to fail rhythm control, may need to consider AV nodal ablation.    Chronic systolic CHF Echo 04/06/2024 LVEF <20% Continue GDMT as tolerated  Karem Farha rechallenge with low dose BB.  Continue spiro, entresto , and ivabradine  as tolerated   For questions or updates, please contact [facility] Please consult [email or web address] for contact info under     Signed, Tylene Galla, PA-C  04/17/2024, 7:51 AM    I have seen and examined this patient with [name].  Agree with above, note added to reflect my findings.  Feeling well today.  No acute complaints.  GEN: No acute distress.   Neck: No JVD Cardiac: RRR, no murmurs, rubs, or gallops.  Respiratory: normal BS bases bilaterally. GI: Soft, nontender, non-distended  MS: No edema; No deformity. Neuro:  Nonfocal  Skin: warm and dry, device site well healed Psych: Normal affect    Ventricular tachycardia: Required external defibrillation.  ICD therapies have been adjusted to account for slower ventricular rates.  VT likely triggered by rapid atrial fibrillation.  [name] continue IV amiodarone  for total of 48 hours. Atrial fibrillation: She has been in and out of atrial fibrillation.  [name] continue amiodarone  as  above. Chronic systolic heart failure: Ejection fraction severely reduced.  Continue goal-directed medical therapy.  Cap Massi add low-dose beta-blocker. Class III obesity: Lifestyle modification encouraged  [name] 04/17/2024 10:11 AM

## 2024-04-17 NOTE — Plan of Care (Signed)
  Problem: Education: Goal: Knowledge of General Education information will improve Description: Including pain rating scale, medication(s)/side effects and non-pharmacologic comfort measures Outcome: Progressing   Problem: Clinical Measurements: Goal: Ability to maintain clinical measurements within normal limits will improve Outcome: Progressing Goal: Will remain free from infection Outcome: Progressing Goal: Diagnostic test results will improve Outcome: Progressing Goal: Respiratory complications will improve Outcome: Progressing Goal: Cardiovascular complication will be avoided Outcome: Progressing   Problem: Activity: Goal: Risk for activity intolerance will decrease Outcome: Progressing   Problem: Nutrition: Goal: Adequate nutrition will be maintained Outcome: Progressing   Problem: Coping: Goal: Level of anxiety will decrease Outcome: Progressing   Problem: Elimination: Goal: Will not experience complications related to bowel motility Outcome: Progressing Goal: Will not experience complications related to urinary retention Outcome: Progressing   Problem: Pain Managment: Goal: General experience of comfort will improve and/or be controlled Outcome: Progressing   Problem: Safety: Goal: Ability to remain free from injury will improve Outcome: Progressing   Problem: Skin Integrity: Goal: Risk for impaired skin integrity will decrease Outcome: Progressing   Problem: Education: Goal: Knowledge of disease or condition will improve Outcome: Progressing Goal: Understanding of medication regimen will improve Outcome: Progressing   Problem: Activity: Goal: Ability to tolerate increased activity will improve Outcome: Progressing   Problem: Cardiac: Goal: Ability to achieve and maintain adequate cardiopulmonary perfusion will improve Outcome: Progressing

## 2024-04-18 LAB — BASIC METABOLIC PANEL WITH GFR
Anion gap: 11 (ref 5–15)
BUN: 13 mg/dL (ref 8–23)
CO2: 25 mmol/L (ref 22–32)
Calcium: 9.2 mg/dL (ref 8.9–10.3)
Chloride: 99 mmol/L (ref 98–111)
Creatinine, Ser: 1 mg/dL (ref 0.44–1.00)
GFR, Estimated: 59 mL/min — ABNORMAL LOW (ref >60)
Glucose, Bld: 96 mg/dL (ref 70–99)
Potassium: 4.7 mmol/L (ref 3.5–5.1)
Sodium: 135 mmol/L (ref 135–145)

## 2024-04-18 LAB — MAGNESIUM: Magnesium: 2.1 mg/dL (ref 1.7–2.4)

## 2024-04-18 MED ORDER — AMIODARONE HCL 200 MG PO TABS
400.0000 mg | ORAL_TABLET | Freq: Two times a day (BID) | ORAL | Status: DC
  Administered 2024-04-18 – 2024-04-19 (×3): 400 mg via ORAL
  Filled 2024-04-18 (×3): qty 2

## 2024-04-18 NOTE — TOC Initial Note (Signed)
 Transition of Care [facility]) - Initial/Assessment Note    Patient Details  Name: [name] MRN: [id number] Date of Birth: Mar 14, 1951  [facility]) CM/SW Contact:    Cosimo Diones, RN Phone Number: 04/18/2024, 3:46 PM  Clinical Narrative:  Patient presented for atrial fib. PTA patient was from home with spouse. Patient has DME rolling walker and wheelchair. Patient is currently active with [facility]- will need resumption orders for PT/OT and F2F. Lennart Quitter is aware that the patient is hospitalized. Patient has insurance and PCP- states she gets to appointments without any issues. Patient states she will have transportation home once stable.           Expected Discharge Plan: Home w Home Health Services Barriers to Discharge: No Barriers Identified   Patient Goals and CMS Choice Patient states their goals for this hospitalization and ongoing recovery are:: plan to return home with home health   Choice offered to / list presented to :  (active with [place])      Expected Discharge Plan and Services In-house Referral: NA Discharge Planning Services: CM Consult Post Acute Care Choice: Home Health, Resumption of Svcs/PTA Provider Living arrangements for the past 2 months: Single Family Home                           HH Arranged: OT, PT HH Agency: [facility] Date [facility] Agency Contacted: 04/18/24 Time HH Agency Contacted: [phone] Representative spoke with at [facility] Agency: Amy  Prior Living Arrangements/Services Living arrangements for the past 2 months: Single Family Home Lives with:: Spouse Patient language and need for interpreter reviewed:: Yes Do you feel safe going back to the place where you live?: Yes      Need for Family Participation in Patient Care: Yes (Comment) Care giver support system in place?: Yes (comment)   Criminal Activity/Legal Involvement Pertinent to Current Situation/Hospitalization: No - Comment as  needed  Activities of Daily Living      Permission Sought/Granted Permission sought to share information with : Family Supports, Magazine features editor, Case Manager Permission granted to share information with : Yes, Verbal Permission Granted     Permission granted to share info w AGENCY: [facility]        Emotional Assessment Appearance:: Appears stated age Attitude/Demeanor/Rapport: Engaged Affect (typically observed): Appropriate Orientation: : Oriented to Self, Oriented to Place, Oriented to  Time, Oriented to Situation Alcohol / Substance Use: Not Applicable Psych Involvement: No (comment)  Admission diagnosis:  Atrial fibrillation with rapid ventricular response (HCC) [I48.91] Patient Active Problem List   Diagnosis Date Noted   Atrial fibrillation with rapid ventricular response (HCC) 04/16/2024   Afib (HCC) 04/11/2024   Atrial fibrillation (HCC) 04/10/2024   Near syncope 04/06/2024   Hypothyroidism 04/06/2024   Encounter for long-term current use of medication 10/05/2016   Acute pain of right shoulder 10/05/2016   Stage 2 chronic kidney disease 10/05/2016   Malignant neoplasm of right female breast (HCC) 10/05/2016   Glaucoma of both eyes 10/05/2016   Anxiety 10/05/2016   Renal calcinosis 10/05/2016   Dyslipidemia 10/05/2016   Osteoarthritis of both knees 10/03/2016   Idiopathic chronic gout, unspecified site, without tophus (tophi) 10/03/2016   Unilateral primary osteoarthritis, right hip 10/03/2016   Hypotension 09/26/2012   Implantable cardioverter-defibrillator (ICD) in situ 06/21/2012   Non-ischemic cardiomyopathy (HCC) 03/27/2012   A-fib (HCC) 09/20/2011   Chronic systolic heart failure (HCC)    OBSTRUCTIVE SLEEP  APNEA 10/28/2010   Secondary cardiomyopathy (HCC) 10/19/2010   Acute thromboembolism of deep veins of lower extremity (HCC) 10/19/2010   LBBB (left bundle branch block) 08/18/2010   Morbid (severe) obesity due to excess calories (HCC)  08/17/2010   DEPRESSION 08/17/2010   OTHER CHRONIC PAIN 08/17/2010   Unspecified glaucoma 08/17/2010   Essential hypertension 08/17/2010   Dissection of aorta (HCC) 08/17/2010   SHINGLES, HX OF 08/17/2010   PCP:  Abbe Hoard., MD Pharmacy:   [facility], [place] - [address] [place] [address] Phone: [phone] Fax: [phone]     Social Drivers of Health (SDOH) Social History: SDOH Screenings   Food Insecurity: No Food Insecurity (04/16/2024)  Housing: Low Risk  (04/16/2024)  Transportation Needs: No Transportation Needs (04/16/2024)  Utilities: Not At Risk (04/16/2024)  Social Connections: Socially Integrated (04/16/2024)  Tobacco Use: Low Risk  (04/15/2024)   SDOH Interventions:     Readmission Risk Interventions    04/12/2024   11:53 AM  Readmission Risk Prevention Plan  Transportation Screening Complete  PCP or Specialist Appt within 5-7 Days Complete  Home Care Screening Complete  Medication Review (RN CM) Complete

## 2024-04-18 NOTE — Evaluation (Signed)
 Physical Therapy Evaluation Patient Details Name: [name] MRN: [id number] DOB: 06/19/1951 Today's Date: 04/18/2024  History of Present Illness  [name] is a 73 y.o. female admitted 04/15/24 for A-fib with RVR. PMHx: NICM, HFrEF s/p CRT-D, HTN, HLD, DVT, type b aortic dissection, breast CA s/p lumpectomy, XRT/chemo, AF, and recent VT with ICD shock. Of note, recent hospitalizations 6/6-6/9 for near syncope and + orthostattics and 6/11-6/13 for VT with ICD firing.   Clinical Impression  Pt admitted with above diagnosis. PTA, pt was modI for functional mobility using a rollator, independent with ADLs, and driving. She lives with her husband in a one story house with a ramped entrance. Pt currently with functional limitations due to the deficits listed below (see PT Problem List). She required CGA for transfers and gait using RW. She ambulated ~152ft using RW and c/o weakness and fatigue. Pt maintained SpO2 >88% on RA throughout session. Pt will benefit from acute skilled PT to increase her independence and safety with mobility to allow discharge. Recommend HHPT to increased strength, improve cardiopulmonary endurance, decrease fall risk, and optimize safety within the home environment.       If plan is discharge home, recommend the following: A little help with walking and/or transfers;A lot of help with bathing/dressing/bathroom;Assistance with cooking/housework;Assist for transportation;Help with stairs or ramp for entrance   Can travel by private vehicle        Equipment Recommendations None recommended by PT  Recommendations for Other Services       Functional Status Assessment Patient has had a recent decline in their functional status and demonstrates the ability to make significant improvements in function in a reasonable and predictable amount of time.     Precautions / Restrictions Precautions Precautions: Fall Recall of Precautions/Restrictions:  Intact Restrictions Weight Bearing Restrictions Per Provider Order: No      Mobility  Bed Mobility               General bed mobility comments: Not assessed. Pt greeted seated in chair finishing up bath at sink with NT.    Transfers Overall transfer level: Needs assistance Equipment used: Rolling walker (2 wheels), 1 person hand held assist Transfers: Sit to/from Stand Sit to Stand: Contact guard assist           General transfer comment: Pt stood from chair with L HHA twice while finishing up her bath and getting dressed with NT present. Introduced RW. Pt demonstrated proper hand placement using AD and powered up with CGA. Good eccentric control with sitting.    Ambulation/Gait Ambulation/Gait assistance: Contact guard assist Gait Distance (Feet): 80 Feet Assistive device: Rolling walker (2 wheels) Gait Pattern/deviations: Step-through pattern, Decreased stride length, Wide base of support, Trunk flexed Gait velocity: dec Gait velocity interpretation: <1.31 ft/sec, indicative of household ambulator   General Gait Details: Pt ambulated slowly within the room/hallway. Cues for proximity to RW as pt demonstrated a fwd flex posture. No LOB. Pt reported weakness and fatigue.  Stairs            Wheelchair Mobility     Tilt Bed    Modified Rankin (Stroke Patients Only)       Balance Overall balance assessment: Mild deficits observed, not formally tested  Pertinent Vitals/Pain Pain Assessment Pain Assessment: No/denies pain    Home Living Family/patient expects to be discharged to:: Private residence Living Arrangements: Spouse/significant other Available Help at Discharge: Family;Available 24 hours/day;Available PRN/intermittently (Husband 24/7; Daughter prn) Type of Home: House Home Access: Ramped entrance       Home Layout: One level Home Equipment: Grab bars - toilet;Rolling Walker (2  wheels);Rollator (4 wheels);Shower seat;BSC/3in1;Grab bars - tub/shower;Hand held shower head;Wheelchair - manual      Prior Function Prior Level of Function : Independent/Modified Independent;History of Falls (last six months);Driving             Mobility Comments: ModI with rollator. 1 fall in the last 63mo where she broke her tailbone.  Husband made a 3 step to help her get in/out of the car. ADLs Comments: Indep with ADLs. Pt reports her medications are managed by the drug store in a bubble pack. Drives.     Extremity/Trunk Assessment   Upper Extremity Assessment Upper Extremity Assessment: Generalized weakness;Right hand dominant    Lower Extremity Assessment Lower Extremity Assessment: Generalized weakness    Cervical / Trunk Assessment Cervical / Trunk Assessment: Other exceptions Cervical / Trunk Exceptions: Body Habitus  Communication   Communication Communication: No apparent difficulties    Cognition Arousal: Alert Behavior During Therapy: WFL for tasks assessed/performed   PT - Cognitive impairments: No apparent impairments                       PT - Cognition Comments: Pt A,Ox4 Following commands: Intact       Cueing Cueing Techniques: Verbal cues     General Comments General comments (skin integrity, edema, etc.): VSS on RA. SpO2 >90% throughout mobility.    Exercises     Assessment/Plan    PT Assessment Patient needs continued PT services  PT Problem List Decreased activity tolerance;Decreased balance;Decreased mobility;Decreased knowledge of use of DME;Cardiopulmonary status limiting activity;Obesity;Decreased strength       PT Treatment Interventions DME instruction;Gait training;Functional mobility training;Therapeutic activities;Therapeutic exercise;Balance training;Patient/family education    PT Goals (Current goals can be found in the Care Plan section)  Acute Rehab PT Goals Patient Stated Goal: Return Home and recover  fully PT Goal Formulation: With patient Time For Goal Achievement: 05/02/24 Potential to Achieve Goals: Good    Frequency Min 2X/week     Co-evaluation               AM-PAC PT 6 Clicks Mobility  Outcome Measure Help needed turning from your back to your side while in a flat bed without using bedrails?: A Little Help needed moving from lying on your back to sitting on the side of a flat bed without using bedrails?: A Little Help needed moving to and from a bed to a chair (including a wheelchair)?: A Little Help needed standing up from a chair using your arms (e.g., wheelchair or bedside chair)?: A Little Help needed to walk in hospital room?: A Little Help needed climbing 3-5 steps with a railing? : A Lot 6 Click Score: 17    End of Session Equipment Utilized During Treatment: Gait belt Activity Tolerance: Patient tolerated treatment well;Patient limited by fatigue Patient left: in chair;with call bell/phone within reach;with chair alarm set Nurse Communication: Mobility status PT Visit Diagnosis: Difficulty in walking, not elsewhere classified (R26.2);Muscle weakness (generalized) (M62.81)    Time: 1601-0932 PT Time Calculation (min) (ACUTE ONLY): 28 min   Charges:   PT Evaluation $PT Eval Low Complexity:  1 Low PT Treatments $Gait Training: 8-22 mins PT General Charges $$ ACUTE PT VISIT: 1 Visit         Glenford Lanes, PT, DPT Acute Rehabilitation Services Office: [phone] Secure Chat Preferred  Riva Chester 04/18/2024, 2:13 PM

## 2024-04-18 NOTE — Discharge Summary (Addendum)
 ELECTROPHYSIOLOGY DISCHARGE SUMMARY    Patient ID: [name],  MRN: [id number], DOB/AGE: 1951-05-30 73 y.o.  Admit date: 04/15/2024 Discharge date: 04/19/2024  Primary Care Physician: Abbe Hoard., MD  Primary Cardiologist: None  Electrophysiologist: Dr. Lawana Pray   Primary Discharge Diagnosis:  Wide Complex Tachycardia d  Secondary Discharge Diagnosis:  NICM  HFrEF with CRT-D AFwRVR  Secondary Hypercoagulable State HTN HLD DVT Hx  OSA    Brief HPI: [name] is a 73 y.o. female with a history of NICM, HFrEF s/p CRT-D, HTN, HLD, DVT, type b aortic dissection, breast CA s/p lumpectomy, XRT/chemo, AF and recent VT with ICD shock admitted for [facility] requiring external defibrillation.   Hospital Course:  The patient was admitted 04/15/24 with feeling poorly in the the setting of wide complex tachycardia.   She was recently discharge from hospital on 04/12/24 after an episode of AFwRVR and VT that was shocked. She was started on PO amiodarone .   In review of EGM / telemetry, there was concern for possible tachy-induced-tachy. Previously, therapies delivered for [facility] caused the pt to develop VT, but with rates >240 bpm, can not rule out dual tachycardia.    The patient's device was reviewed and VT Zones were reprogrammed as below:     VT 1 - Monitor at 150 bpm  VT 2 - 184bpm, ATP, ATP, 36j, 40j x2 VF    - 222 bpm with ATP during charging, shock x5  She was started on IV amiodarone  and ultimately transitioned back to PO prior to discharge. Telemetry prior to discharge was reviewed and showed continued paroxysms of AF but with improved rates.    She was evaluated by PT and not felt to have further PT needs.  She can resume previously prescribed PT and OT. The patient was examined and considered to be stable for discharge.  The patient  be seen back by EP APP in 2 weeks for post hospital care.   Physical Exam: Vitals:   04/18/24 1919 04/18/24 2144 04/18/24 2335  04/19/24 0421  BP: (!) 93/53  104/73 114/60  Pulse: 64 60  (!) 59  Resp: 16 16 18 18   Temp: 97.7 F (36.5 C)  97.6 F (36.4 C) (!) 97.5 F (36.4 C)  TempSrc: Oral  Oral Oral  SpO2: 97% 97% 98%   Weight:      Height:        GEN- NAD. A&O x 3.  HEENT: Normocephalic, atraumatic Lungs- CTAB, Normal effort.  Heart- RRR, No M/G/R.  GI- Soft, NT, ND.  Extremities- No clubbing, cyanosis, or edema; Groin without hematoma   Discharge Medications:  Allergies as of 04/19/2024       Reactions   Wound Dressing Adhesive Other (See Comments)   Blistering of the skin;    Alphagan [brimonidine]    Irritation    Mobic [meloxicam] Other (See Comments)   Cannot take due to heart issue   Nsaids Other (See Comments)   Cannot take due to heart issue   Polyvinyl Alcohol Other (See Comments)   Travatan and Alphagan(irritation)   Statins Other (See Comments)   Per pt her cardiologist told her not to take   Tolmetin Other (See Comments)   Can not take due to heart issue   Travatan Z [travoprost (bak Free)]    Irritation   Tape Rash   tape Blister from clear tapes.        Medication List     TAKE these medications  acetaminophen  500 MG tablet Commonly known as: TYLENOL  Take 1,000 mg by mouth every 6 (six) hours as needed for mild pain (pain score 1-3), moderate pain (pain score 4-6) or headache.   allopurinol  300 MG tablet Commonly known as: ZYLOPRIM  Take 300 mg by mouth daily.   amiodarone  200 MG tablet Commonly known as: PACERONE  Take 2 tablets (400 mg total) by mouth 2 (two) times daily for 14 days, THEN 1 tablet (200 mg total) 2 (two) times daily for 14 days, THEN 1 tablet (200 mg total) daily. Start taking on: April 19, 2024 What changed: See the new instructions.   apixaban  5 MG Tabs tablet Commonly known as: ELIQUIS  Take 1 tablet (5 mg total) by mouth 2 (two) times daily.   bimatoprost  0.03 % ophthalmic solution Commonly known as: LUMIGAN  Place 1 drop into both  eyes at bedtime. Reported on 01/12/2016   Calcium -Vitamin D3 600-200 MG-UNIT Tabs Take 1 tablet by mouth 2 (two) times daily.   cetirizine 10 MG tablet Commonly known as: ZYRTEC Take 10 mg by mouth at bedtime.   clotrimazole-betamethasone cream Commonly known as: LOTRISONE Apply 1 Application topically 2 (two) times daily as needed (Rash).   colchicine  0.6 MG tablet Take 0.6 mg by mouth daily as needed (Gout flare up).   cyanocobalamin 1000 MCG tablet Commonly known as: VITAMIN B12 Take 1,000 mcg by mouth daily.   cyclobenzaprine  10 MG tablet Commonly known as: Flexeril  Take 1 tablet (10 mg total) by mouth 3 (three) times daily as needed for muscle spasms.   Denta 5000 Plus 1.1 % Crea dental cream Generic drug: sodium fluoride Place 1 Application onto teeth 2 (two) times daily.   diclofenac  sodium 1 % Gel Commonly known as: VOLTAREN  APPLY THREE GRAMS TO THREE LARGE JOINTS UP TO THREE TIMES DAILY AS NEEDED What changed:  how much to take how to take this when to take this   docusate sodium  100 MG capsule Commonly known as: COLACE Take 100 mg by mouth 2 (two) times daily as needed for mild constipation.   famotidine  20 MG tablet Commonly known as: PEPCID  Take 20 mg by mouth 2 (two) times daily.   ivabradine  5 MG Tabs tablet Commonly known as: [name] Take 0.5 tablets (2.5 mg total) by mouth 2 (two) times daily. If hr greater than 70   levothyroxine  25 MCG tablet Commonly known as: SYNTHROID  Take 25 mcg by mouth daily.   metoprolol  succinate 25 MG 24 hr tablet Commonly known as: TOPROL -XL Take 1 tablet (25 mg total) by mouth 2 (two) times daily.   montelukast  10 MG tablet Commonly known as: SINGULAIR  Take 10 mg by mouth at bedtime. For allergies & congestion   nystatin  powder Commonly known as: MYCOSTATIN /NYSTOP  Apply topically 2 (two) times daily.   OCUVITE PO Take 5 mg by mouth daily.   omeprazole  40 MG capsule Commonly known as: PRILOSEC TAKE ONE  CAPSULE BY MOUTH TWICE DAILY   PARoxetine  30 MG tablet Commonly known as: PAXIL  Take 30 mg by mouth at bedtime.   rosuvastatin  5 MG tablet Commonly known as: CRESTOR  TAKE ONE TABLET BY MOUTH EVERY EVENING What changed: when to take this   sacubitril -valsartan  24-26 MG Commonly known as: ENTRESTO  Take 1 tablet by mouth 2 (two) times daily.   spironolactone  25 MG tablet Commonly known as: ALDACTONE  Take 0.5 tablets (12.5 mg total) by mouth daily.   SYSTANE OP Place 1 drop into both eyes daily as needed (dry eyes).   traMADol  50 MG  tablet Commonly known as: ULTRAM  Take 50 mg by mouth every 6 (six) hours as needed for pain.   Vitamin D 125 MCG (5000 UT) Caps Take 5,000 Units by mouth daily.   vitamin E 1000 UNIT capsule Take 1,000 Units by mouth daily.        Disposition: Home with usual follow up as in AVS  Duration of Discharge Encounter:  APP time: 30 minutes  Signed, Merla Starch, PA-C  04/19/2024 8:59 AM   I have seen and examined this patient with [name].  Agree with above, note added to reflect my findings.  Patient presented to the hospital with ventricular tachycardia.  She required external cardioversion, SVT was slower than her detection zone on her defibrillator.  In reviewing prior tracings, it seems that she has had atrial fibrillation which triggers her ventricular tachycardia.-Reloaded with IV amiodarone .  If started metoprolol .   plan for discharge home with follow-up in clinic.  She has continued to have episodes of atrial fibrillation.  Have discussed with family that this  likely improve as she gets more amiodarone .  GEN: No acute distress.   Neck: No JVD Cardiac: RRR, no murmurs, rubs, or gallops.  Respiratory: normal BS bases bilaterally. GI: Soft, nontender, non-distended  MS: No edema; No deformity. Neuro:  Nonfocal  Skin: warm and dry, device site well healed Psych: Normal affect   MD time at discharge: 35  minutes   M.  MD 04/19/2024 9:10 AM

## 2024-04-18 NOTE — Progress Notes (Signed)
   04/18/24 2144  BiPAP/CPAP/SIPAP  $ Non-Invasive Home Ventilator  Subsequent  BiPAP/CPAP/SIPAP Pt Type Adult  BiPAP/CPAP/SIPAP Resmed  Mask Type Full face mask  IPAP 26 cmH20  EPAP 22 cmH2O  FiO2 (%) 21 %  Patient Home Machine Yes  Safety Check Completed by RT for Home Unit Yes, no issues noted  Patient Home Mask Yes  Patient Home Tubing Yes  Auto Titrate Yes  Device Plugged into RED Power Outlet Yes  BiPAP/CPAP /SiPAP Vitals  Pulse Rate 60  Resp 16  SpO2 97 %  Bilateral Breath Sounds Clear  MEWS Score/Color  MEWS Score 1  MEWS Score Color Green

## 2024-04-18 NOTE — Progress Notes (Addendum)
 Patient Name: [name] Date of Encounter: 04/18/2024  Primary Cardiologist: None Electrophysiologist: Shakya Sebring Cortland Ding, MD  Interval Summary   Pt denies acute complaints. Asks about timing of discharge.  Daughter on phone (Amy) asks if stress could be the cause of her arrhythmias.   Vital Signs    Vitals:   04/17/24 2235 04/17/24 2339 04/17/24 2349 04/18/24 0351  BP:   (!) 113/53 (!) 110/56  Pulse: 62 (!) 55 (!) 56 63  Resp:   16 18  Temp:  97.6 F (36.4 C) 97.6 F (36.4 C) 97.7 F (36.5 C)  TempSrc:  Oral Oral Oral  SpO2: 98% 94% 92% 97%  Weight:      Height:        Intake/Output Summary (Last 24 hours) at 04/18/2024 0741 Last data filed at 04/18/2024 0428 Gross per 24 hour  Intake 120 ml  Output 500 ml  Net -380 ml   Filed Weights   04/15/24 2058 04/16/24 1543  Weight: 119 kg 117 kg    Physical Exam    GEN- The patient is well appearing, alert and oriented x 3 today.   Lungs- mild abd accessory muscle use, faint exp wheeze noted Cardiac- Regular rate and rhythm, no murmurs, rubs or gallops GI- soft, NT, ND, + BS Extremities- no clubbing or cyanosis. No edema  Telemetry    VP 70's with occ PVC's, intermittent AF with rates 100-130's (personally reviewed)  EP Information   Device:  [employer] CRT-D, implanted 07/15/2011, CS added 09/16/2011 Generator Change 05/22/23  Device Reprogrammed  VT 1 - Monitor at 150 bpm  VT 2 - 184bpm, ATP, ATP, 36j, 40j x2 VF    - 222 bpm with ATP during charging, shock x5  Hospital Course    [name] is a 73 y.o. female with a history of NICM, HFrEF s/p CRT-D, HTN, HLD, DVT, type b aortic dissection, breast CA s/p lumpectomy, XRT/chemo, AF and recent VT with ICD shock admitted for [facility] requiring external defibrillation.   Assessment & Plan    VT with External Defibrillation  Possible tachy-induced-tachy.  Previously, therapies delivered for [facility] caused the pt to develop VT, but with rates >240 bpm. Can not  rule out dual tachycardia.  -continue amiodarone , plan to transition to PO once current bag infused > plan for PO taper 400 mg BID x14 days, then 200 mg daily  -Toprol  25 mg daily, BP tolerated  -device reprogrammed as above -follow electrolytes closely, goal K+>4, Mg+ >2  AFwRVR  In/out of rhythm  -continue OAC for stroke prophylaxis  -Amiodarone  as above  -if fails rhythm control, may need to consider AV node ablation   Secondary Hypercoagulable State  -continue Eliqus 5mg  BID, dose reviewed and appropriate by age / wt   OSA  -CPAP compliance encouraged       For questions or updates, please contact [facility] Please consult [email or web address] for contact info under     Signed, Creighton Doffing, NP-C, AGACNP-BC [facility] - Electrophysiology  04/18/2024, 7:41 AM  I have seen and examined this patient with Creighton Doffing.  Agree with above, note added to reflect my findings.  No acute complaints.  Has continued to have atrial fibrillation, though minimally symptomatic.  Did have trouble with the BiPAP overnight last night.  GEN: No acute distress.   Neck: No JVD Cardiac: RRR, no murmurs, rubs, or gallops.  Respiratory: normal BS bases bilaterally. GI: Soft, nontender, non-distended  MS: No edema; No deformity.  Neuro:  Nonfocal  Skin: warm and dry, device site well healed Psych: Normal affect    Ventricular tachycardia: Possibly induced by rapid atrial fibrillation.  Device did not require external shock.  Ellington Greenslade continue IV amiodarone  for now.  Breeanna Galgano transition to p.o. amiodarone  later today. Atrial fibrillation with rapid response: Continues to have short runs of atrial fibrillation.  Heart rates are much better controlled on current dosing of metoprolol  and amiodarone .  Imani Sherrin increase metoprolol  today. Chronic systolic heart failure: Patient has some wheezing on exam.  Milania Haubner give IV Lasix  once today.  Mane Consolo M. Melicia Esqueda MD 04/18/2024 11:02 AM

## 2024-04-19 ENCOUNTER — Other Ambulatory Visit (HOSPITAL_COMMUNITY): Payer: Self-pay

## 2024-04-19 ENCOUNTER — Other Ambulatory Visit: Payer: Self-pay

## 2024-04-19 LAB — BASIC METABOLIC PANEL WITH GFR
Anion gap: 7 (ref 5–15)
BUN: 16 mg/dL (ref 8–23)
CO2: 27 mmol/L (ref 22–32)
Calcium: 9 mg/dL (ref 8.9–10.3)
Chloride: 101 mmol/L (ref 98–111)
Creatinine, Ser: 0.98 mg/dL (ref 0.44–1.00)
GFR, Estimated: >60 mL/min (ref >60)
Glucose, Bld: 91 mg/dL (ref 70–99)
Potassium: 4.4 mmol/L (ref 3.5–5.1)
Sodium: 135 mmol/L (ref 135–145)

## 2024-04-19 LAB — MAGNESIUM: Magnesium: 2.1 mg/dL (ref 1.7–2.4)

## 2024-04-19 MED ORDER — AMIODARONE HCL 200 MG PO TABS
ORAL_TABLET | ORAL | 0 refills | Status: DC
  Filled 2024-04-19: qty 114, 58d supply, fill #0

## 2024-04-19 MED ORDER — METOPROLOL SUCCINATE ER 25 MG PO TB24
25.0000 mg | ORAL_TABLET | Freq: Two times a day (BID) | ORAL | 6 refills | Status: DC
  Filled 2024-04-19: qty 60, 30d supply, fill #0

## 2024-04-19 NOTE — Care Management Important Message (Signed)
 Important Message  Patient Details  Name: [name] MRN: [id number] Date of Birth: 04-07-1951   Important Message Given:  Yes - Medicare IM     Janith Melnick 04/19/2024, 11:32 AM

## 2024-04-19 NOTE — TOC Transition Note (Signed)
 Transition of Care [facility]) - Discharge Note   Patient Details  Name: [name] MRN: [id number] Date of Birth: 13-Jan-1951  [facility]) CM/SW Contact:  Ronni Colace, RN Phone Number: 04/19/2024, 9:35 AM   Clinical Narrative:    Patient is transitioning home today HH order sin, set up no further needs   Final next level of care: Home w Home Health Services Barriers to Discharge: No Barriers Identified   Patient Goals and CMS Choice Patient states their goals for this hospitalization and ongoing recovery are:: plan to return home with home health   Choice offered to / list presented to :  (active with [place])      Discharge Placement                       Discharge Plan and Services Additional resources added to the After Visit Summary for   In-house Referral: NA Discharge Planning Services: CM Consult Post Acute Care Choice: Home Health, Resumption of Svcs/PTA Provider                    HH Arranged: OT, PT [facility] Agency: [facility] Date [facility] Agency Contacted: 04/18/24 Time HH Agency Contacted: 1546 Representative spoke with at [facility] Agency: Amy  Social Drivers of Health (SDOH) Interventions SDOH Screenings   Food Insecurity: No Food Insecurity (04/16/2024)  Housing: Low Risk  (04/16/2024)  Transportation Needs: No Transportation Needs (04/16/2024)  Utilities: Not At Risk (04/16/2024)  Social Connections: Socially Integrated (04/16/2024)  Tobacco Use: Low Risk  (04/15/2024)     Readmission Risk Interventions    04/12/2024   11:53 AM  Readmission Risk Prevention Plan  Transportation Screening Complete  PCP or Specialist Appt within 5-7 Days Complete  Home Care Screening Complete  Medication Review (RN CM) Complete

## 2024-04-23 ENCOUNTER — Telehealth: Payer: Self-pay | Admitting: Cardiology

## 2024-04-23 NOTE — Telephone Encounter (Signed)
  Patient would like to know if we received the report from [facility] Jude from 04/23/24

## 2024-04-23 NOTE — Telephone Encounter (Signed)
 Attempted to assist patient with sending a remote transmission. Remote monitor troubleshooting was unsuccessful. Phone number provided to patient/family to call [employer] and have them assist with monitor. Pt/family will call now.

## 2024-04-23 NOTE — Telephone Encounter (Signed)
 Patient c/o Palpitations: STAT if patient c/o lightheadedness, shortness of breath, or chest pain  How long have you had palpitations/irregular HR/ Afib? Are you having the symptoms now? Two hours ago  Are you currently experiencing lightheadedness, SOB or CP? Not right now   Do you have a history of afib (atrial fibrillation) or irregular heart rhythm? Yes  Have you checked your BP or HR? (document readings if available): HR: 83 BP: 95/61   Are you experiencing any other symptoms? No

## 2024-04-23 NOTE — Telephone Encounter (Signed)
 Returned patient's phone call regarding palpitations. She states that she went home from the hospital Friday night and had several episodes of diarrhea both Saturday and Sunday for most of the day. She had felt dizzy in the mornings when waking. Today (04/23/24) the dizziness started around 9:00 am and worsened by 11:00 am. Her BP was 87/45, HR=59 (highest HR today was 79). SpO2 88-91%. She only had ONE episode of diarrhea today. She denied Chest Pain during the episode of dizziness but was slightly short of breath. She has not been outside and is staying hydrated; she is able to eat solid foods.   Recheck of BP today at 1:35 pm, BP=89/54, HR=52.  Asked Prentice Passey, PA via [employer] about the above. He advised pt be seen by Urgent Care given her recent VT/hospital admission. Pt requested to be seen by PCP if at all possible. Okay per PA. Pt was advised to be seen by PCP or Urgent Care Today.  She verbalized understanding. She states she does have a family member that can take her. No other concerns.

## 2024-04-24 NOTE — Telephone Encounter (Signed)
 Spoke with patient and gave instructions per Dr. Inocencio orders.   She does report continued borderline low BP readings hovering right around 100 systolic with some readings in the 90's.  Patient is to continue to monitor bp and 02 levels and follow up with Dr. Luz as needed.  For now we will hold on the Metoprolol  increase unless rates become more problematic before visit with Daphne Barrack, NP on 7/8.

## 2024-04-24 NOTE — Telephone Encounter (Addendum)
 Received transmission. Normal device function.   AMS c/w AF/AFL, not always good rate control, burden 4.4% since 04/16/24, Eliquis  per EPIC Also note numerous device flagged NSVT events that appear atrial driven (AF with RVR). We do not have EGMs to view for all NSVT events.  Patient recently hospitalized (6/16) for symptomatic AF, started on amiodarone . She is to follow up in clinic with Daphne Barrack on 7/8.  Dizziness yesterday correlates with time of recorded AMS episodes showing AF with elevated V rates. Recent episodes of diarrhea over the weekend and hypotension.  She did not go to urgent care yesterday as instructed in prior note.   She is feeling better today.  Has ER precautions if symptoms return in interim before she sees Brandi.  Will also forward to Dr. Inocencio for review if anything further.

## 2024-04-24 NOTE — Telephone Encounter (Signed)
 Reviewed with Dr. Inocencio:   Can increase her Metoprolol  from 25mg  bid to 50mg  bid to help some with the rates, but not if blood pressures are running at or less than 100/50's.   Continue taking amiodarone  400mg  bid until she sees Daphne Barrack, NP on 7/8.   Provide reassurance that it is just going to take time for medicines to get in her system but things will start slowing down.   LM for patient to call back to discuss above orders.

## 2024-04-25 ENCOUNTER — Other Ambulatory Visit (HOSPITAL_COMMUNITY): Payer: Self-pay

## 2024-04-27 ENCOUNTER — Telehealth: Payer: Self-pay | Admitting: Cardiology

## 2024-04-27 NOTE — Telephone Encounter (Signed)
 Outpatient service line:  She has recent hospitalization where she has had paroxysmal episodes of atrial fibrillation throughout the admission.  She calls again reporting that she thinks she is in atrial fibrillation but heart rates per her record are between 60-120.  She was feeling lightheaded but symptoms have improved.  Discussed that she is likely going to have paroxysmal episodes while she is on her amiodarone  titration.  She reports compliance with all of her medications.  Discussed ER precautions for uncontrolled rates and severe symptoms.  She is comfortable with managing this outpatient and will see follow-up provider on 07/08

## 2024-04-28 ENCOUNTER — Telehealth: Payer: Self-pay | Admitting: Cardiology

## 2024-04-28 NOTE — Telephone Encounter (Signed)
 Outpatient service line:  Patient calling now reporting of intermittent complaints of shortness of breath and low oxygen saturations that sound somewhat inaccurate given the fluctuations.  Sometimes she notes that they are in the 80s but she is talking on the phone, comfortable and oxygen saturations are in the 95's.  She notes some shortness of breath when ambulating but now feels better.  We discussed possibly going to the ER should her symptoms worsen.  She has pretty close follow-up with EP on the eighth so we are comfortable with holding off for the time being.

## 2024-04-30 ENCOUNTER — Telehealth: Payer: Self-pay

## 2024-04-30 NOTE — Telephone Encounter (Signed)
 The pt states she feel like she is having some SOB. The pt weighed herself yesterday and was 260 and today she weighed 262.4. I had the pt send a manual transmission for the nurse to review.  Pt oxygen been running between 88-95. Transmission received 04/30/2024. I let her know that the nurse will give her a call back.

## 2024-04-30 NOTE — Telephone Encounter (Signed)
 I called and spoke with the patient.  She states she has been having some increased SOB unrelated to activity over the last few days. She was recently admitted for A-fib w/ RVR (04/15/24-04/19/24)  and started on an amiodarone  load.   The patient states she her weights & BPs have been: 6/28- no weight given- BP- 97/69 6/29- 260.6 lbs in the AM - BP 108/74 Today- 264.2 lbs at 1:45 pm- BP 110/66 The patient was encouraged to weight herself at the same time every day (1st thing in the morning after voiding).  Confirmed the patient's medications on file are correct and she has not missed any doses.  Last dose of PRN [name] was 04/23/24.   The patient sent a remote transmission showing that she is currently in NSR. Brief episodes of AT/AF.  Advised the patient that she may still experience breakthrough of her AF while her amiodarone  is loading. The patient states that some of what she is feeling my also be her nerves and I advised this could also be some of what she is experiencing. She had some nausea last week that did end up resolving- made the patient aware this could be a potential side effect of high dose amiodarone , to continue to monitor.   The patient is aware I will forward to Dr. Inocencio to review but to continue her current mediations at this time and follow up with Daphne Barrack, NP on 05/07/24 as scheduled.  Mrs. lavonda thal understanding of the above was very appreciative of the call back.

## 2024-05-01 MED ORDER — FUROSEMIDE 40 MG PO TABS: 40.0000 mg | ORAL_TABLET | Freq: Every day | ORAL | Status: DC

## 2024-05-01 NOTE — Telephone Encounter (Signed)
 Pt is calling back and states she is have been having some difficulty catching her breath. I let her know we did send a phone note to Dr. Inocencio and we will send a message to Heart Failure as well.

## 2024-05-01 NOTE — Telephone Encounter (Signed)
 Called and spoke w/pt, she states she is feeling a little better today, wt is slightly down to 261 lbs, Furosemide  is no longer on med list and pt reports it was stopped during her recent hospitalization (was previously on 40 mg daily). Pt has had 3 admits in June, upon review of chart med was stopped at hosp discharge on 04/08/24 due to near-syncope r/t 30 lb wt loss and orthostatic hypotension.   Pt reports BP has been running upper 90s-110s, which is stable/normal for pt, denies presyncope/lightheadedness, reports occ dizziness mainly r/t afib, c/o nausea off on advised again this could be r/t high dose Amio she is on 400 mg BID per chart should decrease to 200 mg BID on 7/4 however pt states Dr Inocencio office told her to continue 400 mg BID until appt next week, advised she should f/u with them regarding Amio dosing  Pt will restart Furosemide  40 mg Daily per Greig Mosses, NP, will keep appt as sch with our office on 7/9

## 2024-05-02 ENCOUNTER — Telehealth: Payer: Self-pay | Admitting: Cardiology

## 2024-05-02 NOTE — Telephone Encounter (Signed)
*  STAT* If patient is at the pharmacy, call can be transferred to refill team.   1. Which medications need to be refilled? (please list name of each medication and dose if known) furosemide  (LASIX ) 40 MG tablet   2. Which pharmacy/location (including street and city if local pharmacy) is medication to be sent to?  [facility] - [place], [place] - [address]    3. Do they need a 30 day or 90 day supply? 90 day  Pt is out of medication

## 2024-05-02 NOTE — Telephone Encounter (Signed)
 Medication sent in by NP per note from yesterday.

## 2024-05-06 NOTE — Progress Notes (Unsigned)
 Electrophysiology Office Note:   Date:  05/07/2024  ID:  [name], DOB 06/20/1951, MRN [id number]  Primary Cardiologist: None Primary Heart Failure: Toribio Fuel, MD Electrophysiologist: Soyla Gladis Norton, MD       History of Present Illness:   [name] is a 73 y.o. female with h/o NICM, VT, HFrEF s/p CRT-D, HTN, HLD, DVT, type b aortic dissection, breast CA s/p lumpectomy, XRT/chemo, AF  seen today for post hospital follow up.    She was recently discharge from hospital on 04/12/24 after an episode of AFwRVR and VT that was shocked. She was started on PO amiodarone .   Admitted 6/16-6/20/25 for wide complex tachycardia that required ICD shock and external defibrillation. In review of EGM / telemetry, there was concern for possible tachy-induced-tachy. Previously, therapies delivered for [facility] caused the pt to develop VT, but with rates >240 bpm, can not rule out dual tachycardia.     The patient's device was reviewed and VT Zones were reprogrammed as below:     VT 1 - Monitor at 150 bpm  VT 2 - 184bpm, ATP, ATP, 36j, 40j x2 VF    - 222 bpm with ATP during charging, shock x5   Since discharge from hospital the patient reports doing she has felt better. No further episodes of shock. She has had some lightheadedness and dizziness in the last few days.  Her home blood pressure readings have been running 70-105 systolic.  She started on amiodarone  200 mg once daily as of 05/07/24 am.    She denies chest pain, palpitations, dyspnea, PND, orthopnea, nausea, vomiting, dizziness, syncope, edema, weight gain, or early satiety.   Review of systems complete and found to be negative unless listed in HPI.    EP Information / Studies Reviewed:    EKG is not ordered today. EKG from 04/19/24 reviewed which showed AF with RVR, LBBB 111 bpm     ICD Interrogation-  reviewed in detail today,  See PACEART report.  Device History: [employer] BiV ICD implanted 07/15/11 for HFrEF, LV lead  added 09/16/11  Generator Change > 05/22/23  History of appropriate therapy: Yes History of AAD therapy: Yes; currently on amiodarone     Studies:  ECHO 04/06/24 > LVEF <20%, no mural apical thrombus with definity , LV demonstrates global hypokinesis, LV severely dilated, LA mod dilated, functional MR from LV dysfnction and reduced posterior leaflet motion apically displaced coaptation point, mild-mod MV regurgitation  Arrhythmia / AAD AF  VT  Amiodarone  > initiated 03/2024   Risk Assessment/Calculations:    CHA2DS2-VASc Score = 4   This indicates a 4.8% annual risk of stroke. The patient's score is based upon: CHF History: 1 HTN History: 1 Diabetes History: 0 Stroke History: 0 Vascular Disease History: 0 Age Score: 1 Gender Score: 1             Physical Exam:   VS:  BP (!) 88/50   Pulse (!) 56   Ht 5' 1 (1.549 m)   Wt 261 lb 12.8 oz (118.8 kg)   SpO2 91%   BMI 49.47 kg/m    Wt Readings from Last 3 Encounters:  05/07/24 261 lb 12.8 oz (118.8 kg)  04/16/24 258 lb (117 kg)  04/12/24 262 lb 5.6 oz (119 kg)     GEN: Well nourished, well developed in no acute distress NECK: No JVD; No carotid bruits CARDIAC: Regular rate and rhythm, no murmurs, rubs, gallops RESPIRATORY:  Clear to auscultation without rales, wheezing or rhonchi  ABDOMEN: Soft, non-tender, non-distended EXTREMITIES:  No edema; No deformity   ASSESSMENT AND PLAN:    Chronic Systolic Dysfunction due to NICM s/p [employer] CRT-D  LBBB  -euvolemic on exam and by device    -95% BiV pacing  -completed loading, continue amiodarone  200 mg daily   -update labs post admit > CMP, TSH, free T4   -Stable on an appropriate medical regimen -Normal ICD function -See Pace Art report -No changes today  Dizziness / Lightheadedness  Low BP Readings  -on multiple agents > reduce Toprol  to 12.5 mg BID but anticipate this will have a modest improvement in BP. She is to see the Advanced HF Team on 05/08/24, revisit BP at  that time  Paroxysmal Atrial Fibrillation  CHA2DS2-VASc 4 -continue OAC for stroke prophylaxis  -amiodarone  as above  -reduce Toprol  to 12.5mg  BID -AF burden on device 13% (includes recent admit data), programmer cleared at visit.  Longest prior episode was 1 hour 4 sec, most episodes were 5 min or less  Secondary Hypercoagulable State  -continue Eliquis  5mg  BID, dose reviewed and appropriate by age / wt   OSA  -CPAP compliant > just got her machine post hospital admit   Disposition:   Follow up with Dr. Inocencio in 6 months   Signed, Daphne Barrack, NP-C, AGACNP-BC [facility] - Electrophysiology  05/07/2024, 1:04 PM

## 2024-05-07 ENCOUNTER — Encounter: Payer: Self-pay | Admitting: Pulmonary Disease

## 2024-05-07 ENCOUNTER — Ambulatory Visit: Attending: Pulmonary Disease | Admitting: Pulmonary Disease

## 2024-05-07 VITALS — BP 88/50 | HR 56 | Ht 61.0 in | Wt 261.8 lb

## 2024-05-07 DIAGNOSIS — I5022 Chronic systolic (congestive) heart failure: Secondary | ICD-10-CM

## 2024-05-07 DIAGNOSIS — Z95810 Presence of automatic (implantable) cardiac defibrillator: Secondary | ICD-10-CM

## 2024-05-07 DIAGNOSIS — I480 Paroxysmal atrial fibrillation: Secondary | ICD-10-CM | POA: Diagnosis not present

## 2024-05-07 DIAGNOSIS — D6869 Other thrombophilia: Secondary | ICD-10-CM

## 2024-05-07 DIAGNOSIS — I447 Left bundle-branch block, unspecified: Secondary | ICD-10-CM

## 2024-05-07 DIAGNOSIS — G4733 Obstructive sleep apnea (adult) (pediatric): Secondary | ICD-10-CM

## 2024-05-07 LAB — CUP PACEART INCLINIC DEVICE CHECK
Battery Remaining Longevity: 57 mo
Brady Statistic RA Percent Paced: 13 %
Brady Statistic RV Percent Paced: 95 %
Date Time Interrogation Session: 20250708131126
HighPow Impedance: 94.5 Ohm
Implantable Lead Connection Status: 753985
Implantable Lead Connection Status: 753985
Implantable Lead Connection Status: 753985
Implantable Lead Implant Date: 20120914
Implantable Lead Implant Date: 20120914
Implantable Lead Implant Date: 20121116
Implantable Lead Location: 753858
Implantable Lead Location: 753859
Implantable Lead Location: 753860
Implantable Lead Model: 511212
Implantable Lead Serial Number: 194059
Implantable Pulse Generator Implant Date: 20240722
Lead Channel Impedance Value: 287.5 Ohm
Lead Channel Impedance Value: 400 Ohm
Lead Channel Impedance Value: 437.5 Ohm
Lead Channel Pacing Threshold Amplitude: 0.75 V
Lead Channel Pacing Threshold Amplitude: 1.125 V
Lead Channel Pacing Threshold Amplitude: 1.25 V
Lead Channel Pacing Threshold Amplitude: 1.25 V
Lead Channel Pacing Threshold Pulse Width: 0.5 ms
Lead Channel Pacing Threshold Pulse Width: 0.5 ms
Lead Channel Pacing Threshold Pulse Width: 0.5 ms
Lead Channel Pacing Threshold Pulse Width: 0.7 ms
Lead Channel Sensing Intrinsic Amplitude: 0.6 mV
Lead Channel Sensing Intrinsic Amplitude: 11.6 mV
Lead Channel Setting Pacing Amplitude: 1.75 V
Lead Channel Setting Pacing Amplitude: 2.125
Lead Channel Setting Pacing Amplitude: 2.5 V
Lead Channel Setting Pacing Pulse Width: 0.5 ms
Lead Channel Setting Pacing Pulse Width: 0.7 ms
Lead Channel Setting Sensing Sensitivity: 0.5 mV
Pulse Gen Serial Number: 5552877
Zone Setting Status: 755011

## 2024-05-07 MED ORDER — METOPROLOL SUCCINATE ER 25 MG PO TB24: 12.5000 mg | ORAL_TABLET | Freq: Two times a day (BID) | ORAL | Status: DC

## 2024-05-07 NOTE — Patient Instructions (Signed)
 Medication Instructions:  Decrease metoprolol  succinate (Toprol  XL) to 12.5 mg twice daily, this will be 1/2 of the 25 mg tablet. *If you need a refill on your cardiac medications before your next appointment, please call your pharmacy*  Lab Work: CMET, TSH, FreeT4-TODAY If you have labs (blood work) drawn today and your tests are completely normal, you will receive your results only by: MyChart Message (if you have MyChart) OR A paper copy in the mail If you have any lab test that is abnormal or we need to change your treatment, we will call you to review the results.  Follow-Up: At [facility], you and your health needs are our priority.  As part of our continuing mission to provide you with exceptional heart care, our providers are all part of one team.  This team includes your primary Cardiologist (physician) and Advanced Practice Providers or APPs (Physician Assistants and Nurse Practitioners) who all work together to provide you with the care you need, when you need it.  Your next appointment:   6 month(s)  Provider:   You may see Will Gladis Norton, MD or one of the following Advanced Practice Providers on your designated Care Team:   Charlies Arthur, PA-C Michael Andy Tillery, PA-C Suzann Riddle, NP Daphne Barrack, NP

## 2024-05-08 ENCOUNTER — Ambulatory Visit (HOSPITAL_COMMUNITY)
Admit: 2024-05-08 | Discharge: 2024-05-08 | Disposition: A | Discharge status: Home / Self Care | Source: Ambulatory Visit | Attending: Family Medicine | Admitting: Family Medicine

## 2024-05-08 ENCOUNTER — Encounter (HOSPITAL_COMMUNITY): Payer: Self-pay

## 2024-05-08 VITALS — BP 90/50 | HR 61 | Wt 264.0 lb

## 2024-05-08 DIAGNOSIS — I110 Hypertensive heart disease with heart failure: Secondary | ICD-10-CM | POA: Insufficient documentation

## 2024-05-08 DIAGNOSIS — I5022 Chronic systolic (congestive) heart failure: Secondary | ICD-10-CM | POA: Insufficient documentation

## 2024-05-08 DIAGNOSIS — I7100 Dissection of unspecified site of aorta: Secondary | ICD-10-CM

## 2024-05-08 DIAGNOSIS — G4733 Obstructive sleep apnea (adult) (pediatric): Secondary | ICD-10-CM | POA: Insufficient documentation

## 2024-05-08 DIAGNOSIS — K5900 Constipation, unspecified: Secondary | ICD-10-CM | POA: Diagnosis not present

## 2024-05-08 DIAGNOSIS — Z79899 Other long term (current) drug therapy: Secondary | ICD-10-CM | POA: Insufficient documentation

## 2024-05-08 DIAGNOSIS — I447 Left bundle-branch block, unspecified: Secondary | ICD-10-CM | POA: Insufficient documentation

## 2024-05-08 DIAGNOSIS — I428 Other cardiomyopathies: Secondary | ICD-10-CM | POA: Diagnosis not present

## 2024-05-08 DIAGNOSIS — I493 Ventricular premature depolarization: Secondary | ICD-10-CM | POA: Insufficient documentation

## 2024-05-08 DIAGNOSIS — Z6841 Body Mass Index (BMI) 40.0 and over, adult: Secondary | ICD-10-CM | POA: Insufficient documentation

## 2024-05-08 DIAGNOSIS — Z7901 Long term (current) use of anticoagulants: Secondary | ICD-10-CM | POA: Diagnosis not present

## 2024-05-08 DIAGNOSIS — I480 Paroxysmal atrial fibrillation: Secondary | ICD-10-CM | POA: Insufficient documentation

## 2024-05-08 DIAGNOSIS — Z9221 Personal history of antineoplastic chemotherapy: Secondary | ICD-10-CM | POA: Diagnosis not present

## 2024-05-08 LAB — TSH: TSH: 5.31 u[IU]/mL — ABNORMAL HIGH (ref 0.450–4.500)

## 2024-05-08 LAB — COMPREHENSIVE METABOLIC PANEL WITH GFR
ALT: 20 IU/L (ref 0–32)
AST: 30 IU/L (ref 0–40)
Albumin: 3.6 g/dL — ABNORMAL LOW (ref 3.8–4.8)
Alkaline Phosphatase: 108 IU/L (ref 44–121)
BUN/Creatinine Ratio: 16 (ref 12–28)
BUN: 23 mg/dL (ref 8–27)
Bilirubin Total: 0.4 mg/dL (ref 0.0–1.2)
CO2: 26 mmol/L (ref 20–29)
Calcium: 9.5 mg/dL (ref 8.7–10.3)
Chloride: 94 mmol/L — ABNORMAL LOW (ref 96–106)
Creatinine, Ser: 1.46 mg/dL — ABNORMAL HIGH (ref 0.57–1.00)
Globulin, Total: 3 g/dL (ref 1.5–4.5)
Glucose: 89 mg/dL (ref 70–99)
Potassium: 4.5 mmol/L (ref 3.5–5.2)
Sodium: 138 mmol/L (ref 134–144)
Total Protein: 6.6 g/dL (ref 6.0–8.5)
eGFR: 38 mL/min/1.73 — ABNORMAL LOW (ref >59)

## 2024-05-08 LAB — T4, FREE: Free T4: 1.45 ng/dL (ref 0.82–1.77)

## 2024-05-08 MED ORDER — LOSARTAN POTASSIUM 25 MG PO TABS: 12.5000 mg | ORAL_TABLET | Freq: Every evening | ORAL | 3 refills | Status: DC

## 2024-05-08 NOTE — Patient Instructions (Addendum)
 Thank you for coming in today  If you had labs drawn today, any labs that are abnormal the clinic will call you No news is good news  Medications: STOP Ivabradine  Stop Entresto  START Losartan  12.5 mg 1/2 tablet nightly  Follow up appointments:  Your physician recommends that you schedule a follow-up appointment in:  Keep clinic follow up 07/22/2024   Do the following things EVERYDAY: Weigh yourself in the morning before breakfast. Write it down and keep it in a log. Take your medicines as prescribed Eat low salt foods--Limit salt (sodium) to 2000 mg per day.  Stay as active as you can everyday Limit all fluids for the day to less than 2 liters   At the [facility], you and your health needs are our priority. As part of our continuing mission to provide you with exceptional heart care, we have created designated Provider Care Teams. These Care Teams include your primary Cardiologist (physician) and Advanced Practice Providers (APPs- Physician Assistants and Nurse Practitioners) who all work together to provide you with the care you need, when you need it.   You may see any of the following providers on your designated Care Team at your next follow up: Dr Toribio Fuel Dr Ezra Shuck Dr. Ria Gardenia Greig Lenetta, NP Caffie Shed, [place] [facility] [place], [place] Beckey Coe, NP Tinnie Redman, PharmD   Please be sure to bring in all your medications bottles to every appointment.    Thank you for choosing [facility]  If you have any questions or concerns before your next appointment please send us  a message through [place] or call our office at [phone].    TO LEAVE A MESSAGE FOR THE NURSE SELECT OPTION 2, PLEASE LEAVE A MESSAGE INCLUDING: YOUR NAME DATE OF BIRTH CALL BACK NUMBER REASON FOR CALL**this is important as we prioritize the call backs  YOU WILL RECEIVE A CALL BACK THE SAME DAY AS  LONG AS YOU CALL BEFORE 4:00 PM

## 2024-05-08 NOTE — Progress Notes (Signed)
 Advanced Heart Failure Clinic Note   PCP: Thurmond Cathlyn LABOR., MD Oncologist: Dr. Clydell, at [facility] EP: Dr. Inocencio HF Cardiologist: Dr. Cherrie  HPI: Ms. Bohle is a 73 y.o. woman with a history of HTN, Type B aortic dissection (2010) managed with medical therapy by Dr. Fleeta Ochoa and has healed, LBBB, systolic heart failure due to NICM with EF 10-15% Cath Dec 2011 showed normal arteries.    She was diagnosed with breast cancer in 2009 s/p lumpectomy, XRT, and chemotherapy.  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   She underwent St Jude Bi-V ICD implantation Sept 2012 but unable to place LV lead.  She eventually brought back in Nov 2012 for left thoracotomy and placement of epicardial LV lead placement by Dr. Fleeta Ochoa.  She also had a sleep study that showed mild OSA.  No clinical intervention occurred.    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 Started PPI and famotidine . Question also raised about possible trial off ARNI.   Follow up 5/22, stable NYHA II-III. Echo  EF 20-25%, RV ok. Screened for [employer].  Echo 11/23 EF 20-25% RV ok   Zio 2/25 4 days 3 SVT (longest 2 min 46 sec) No AF.   Sleep study severe OSA AHI 71, OSA 33 CSA   Admitted 6/25 for pre-syncope, felt to be orthostatic hypotension,  no arrhythmias noted on device interrogation. Admitted 04/10/24 with multiple shocks, interrogation showed AF RVR that devolved into VT/VF, for which she received 3 shocks Loaded with IV amio, later transitioned to po and discharged home. Re-admitted 3 days later with ICD shocks and LOC. Felt to have rapid AF again that devolved into VT. She was loaded again with IV amio. Echo showed EF < 20%, moderate MR Device settings adjusted, discussed possibility of AVJ ablation. Amio transitioned to PO and she was discharged home, weight 261 lbs.  Today she returns for post  hospital HF follow up with her family. Overall feeling light-headed, feels palpitations. She is not SOB working with PT. Denies abnormal bleeding, CP, edema, or PND/Orthopnea. Appetite ok. Weight at home 261 pounds. Taking all medications. She is constipated & not sleeping well. Wearing CPAP.  Cardiac Studies: - Echo 6/25: EF < 20%, moderate MR - Echo (5/22): EF 20-25% RV ok  - Echo (9/21): EF 20-25% with dyssynchrony RV ok  - Echo (3/21): EF 10% with severe dyssynchrony. RV ok  - Echo (2/19): EF 20%, RV ok. - Echo (12/17): EF 10-15%, Grade 1 DD, mild PI, Mild TR. Normal RV. - Echo (12/16): EF 15%   - CPX (8/16): Peak VO2 10.1 VE/VCO2 slope 28.4 RER 1.14 OUES 1.3 PFTs mildly restrictive No significant change compared to prior.  Limited more by obesity and restrictive lung physiology than heart (mild circulatory limitation).   - Echo (10/13/14): EF 15-20% RV ok  - CPX (4/15): Peak VO2: 10.3 (66.4% predicted peak VO2) VE/VCO2 slope: 25.0 OUES: 1.30 Peak RER: 1.20  - Echo (07/10/13): EF 20% RV ok  - Echo (01/02/13): EF 15% Grade 1 diastolic dysfunction. RV ok. No PAH  - CPX (12/13) Peak VO2: 10.6 % predicted peak VO2: 64.4% VE/VCO2 slope: 26.9 OUES: 1.18 Peak RER: 1.29 Ventilatory Threshold: 7.3 % predicted peak VO2: 44.4% Peak RR 23 Peak Ventilation: 29.8 VE/MVV: 32.6% PETCO2 at peak: 43  Review of systems complete and found to be negative unless listed in  HPI.      Past Medical History:  Diagnosis Date   Aortic dissection (HCC) 12/2008   Type B   Arthritis    lower back   Atrial fibrillation (HCC)    post op (left thoracotomy) 11/12 req amio/DCCV   Breast cancer (HCC) 07/2009   Stage 3 lumpectomy, radiation, chemotherapy; finished chemo October, felt to be in remission   Cardiac defibrillator in place    St. Jude (JA) 9/12; LV lead placement unsuccessful;  s/p left thoracotomy with placement of epicardial LV lead 09/2011 (Dr. PVT)   CHF (congestive heart failure)  (HCC)    Chronic systolic dysfunction of left ventricle    Depression    DVT (deep venous thrombosis) (HCC) 10/18/2010   GERD (gastroesophageal reflux disease)    Glaucoma    Hiatal hernia    small hiatal hernia   History of shingles    Hyperlipidemia    Hypertension    Left bundle branch block    Nonischemic cardiomyopathy (HCC)    Admission 12/11 with EF 25%, normal coronary arteries by cath 12/11; NICM presumed to be secondary to chemotherapy for breast cancer   Obesity    [name]    with exertion   Current Outpatient Medications  Medication Sig Dispense Refill   acetaminophen  (TYLENOL ) 500 MG tablet Take 1,000 mg by mouth every 6 (six) hours as needed for mild pain (pain score 1-3), moderate pain (pain score 4-6) or headache.     allopurinol  (ZYLOPRIM ) 300 MG tablet Take 300 mg by mouth daily.     amiodarone  (PACERONE ) 200 MG tablet Take 2 tablets (400 mg total) by mouth 2 (two) times daily for 14 days, THEN 1 tablet (200 mg total) 2 (two) times daily for 14 days, THEN 1 tablet (200 mg total) daily. 114 tablet 0   apixaban  (ELIQUIS ) 5 MG TABS tablet Take 1 tablet (5 mg total) by mouth 2 (two) times daily. 60 tablet 3   bimatoprost  (LUMIGAN ) 0.03 % ophthalmic solution Place 1 drop into both eyes at bedtime. Reported on 01/12/2016     Calcium  Carbonate-Vitamin D (CALCIUM -VITAMIN D3) 600-200 MG-UNIT TABS Take 1 tablet by mouth 2 (two) times daily.     cetirizine (ZYRTEC) 10 MG tablet Take 10 mg by mouth at bedtime.     Cholecalciferol (VITAMIN D) 125 MCG (5000 UT) CAPS Take 5,000 Units by mouth daily.     clotrimazole-betamethasone (LOTRISONE) cream Apply 1 Application topically 2 (two) times daily as needed (Rash).     colchicine  0.6 MG tablet Take 0.6 mg by mouth daily as needed (Gout flare up).     cyanocobalamin (VITAMIN B12) 1000 MCG tablet Take 1,000 mcg by mouth daily.     cyclobenzaprine  (FLEXERIL ) 10 MG tablet Take 1 tablet (10 mg total) by mouth 3 (three) times  daily as needed for muscle spasms. 30 tablet 0   diclofenac  sodium (VOLTAREN ) 1 % GEL APPLY THREE GRAMS TO THREE LARGE JOINTS UP TO THREE TIMES DAILY AS NEEDED 3 Tube 3   docusate sodium  (COLACE) 100 MG capsule Take 100 mg by mouth 2 (two) times daily as needed for mild constipation.     famotidine  (PEPCID ) 20 MG tablet Take 20 mg by mouth 2 (two) times daily.     furosemide  (LASIX ) 40 MG tablet Take 1 tablet (40 mg total) by mouth daily.     ivabradine  ([name]) 5 MG TABS tablet Take 0.5 tablets (2.5 mg total) by mouth 2 (two) times daily. If  hr greater than 70 90 tablet 3   levothyroxine  (SYNTHROID ) 25 MCG tablet Take 25 mcg by mouth daily.     metoprolol  succinate (TOPROL -XL) 25 MG 24 hr tablet Take 0.5 tablets (12.5 mg total) by mouth 2 (two) times daily.     montelukast  (SINGULAIR ) 10 MG tablet Take 10 mg by mouth at bedtime. For allergies & congestion     Multiple Vitamins-Minerals (OCUVITE PO) Take 5 mg by mouth daily.     nystatin  (MYCOSTATIN /NYSTOP ) powder Apply topically 2 (two) times daily. 30 g 0   omeprazole  (PRILOSEC) 40 MG capsule TAKE ONE CAPSULE BY MOUTH TWICE DAILY 60 capsule 8   ondansetron  (ZOFRAN ) 4 MG tablet Take 4 mg by mouth every 8 (eight) hours as needed.     PARoxetine  (PAXIL ) 30 MG tablet Take 30 mg by mouth at bedtime.  1   Polyethyl Glycol-Propyl Glycol (SYSTANE OP) Place 1 drop into both eyes daily as needed (dry eyes).     ranitidine (ZANTAC) 300 MG capsule Take 300 mg by mouth daily as needed. Acid indigestion     rosuvastatin  (CRESTOR ) 5 MG tablet TAKE ONE TABLET BY MOUTH EVERY EVENING 90 tablet 1   sacubitril -valsartan  (ENTRESTO ) 24-26 MG Take 1 tablet by mouth 2 (two) times daily.     sodium fluoride (DENTA 5000 PLUS) 1.1 % CREA dental cream Place 1 Application onto teeth 2 (two) times daily.     spironolactone  (ALDACTONE ) 25 MG tablet Take 0.5 tablets (12.5 mg total) by mouth daily. 15 tablet 3   traMADol  (ULTRAM ) 50 MG tablet Take 50 mg by mouth every 6  (six) hours as needed for pain.     vitamin E 1000 UNIT capsule Take 1,000 Units by mouth daily.     No current facility-administered medications for this encounter.   BP (!) 90/50   Pulse 61   Wt 119.7 kg (264 lb)   SpO2 94%   BMI 49.88 kg/m   Wt Readings from Last 3 Encounters:  05/08/24 119.7 kg (264 lb)  05/07/24 118.8 kg (261 lb 12.8 oz)  04/16/24 117 kg (258 lb)   Physical Exam  General:  NAD. No resp difficulty, arrived in [facility], elderly HEENT: Normal Neck: Supple. No JVD. Cor: Regular rate & rhythm. No rubs, gallops or murmurs. Lungs: Clear Abdomen: Soft, obese nontender, nondistended.  Extremities: No cyanosis, clubbing, rash, edema Neuro: Alert & oriented x 3, moves all 4 extremities w/o difficulty. Affect pleasant.  Device interrogation (personally reviewed): Impedence recently down but trending back up, 16% AT/AF burden, 82% BP since 05/07/24 (94% lifetime), 5 short bursts (4-10 seconds) of NSVT since 05/07/24, 1.2% PVC burden  ECG (personally reviewed): A sensed, V paced 1 PVC, LBBB QRS 182 msec  ASSESSMENT & PLAN:  1. Chronic systolic CHF: Nonischemic cardiomyopathy.   - Echo (12/17): EF 10-15% s/p St Jude Bi-V ICD with epicardial LV lead.  - Echo (2/19): EF 20% stable. - CPX in (8/16) was similar to the past: it appeared that the major limitation was from body habitus/deconditioning and not cardiac - Echo (3/21): EF 10-15% severe dyssynchrony, RV ok  - Underwent AV optimization of CRT in 4/21 QRS 170 -> - Echo (9/21): EF 20-25% with dyssynchrony RV ok  - Echo (5/22): EF 20-25% RV ok  - Echo (11/23): EF 20-25% RV ok  - Echo 6/25: EF < 20%, RV ok, moderate MR - Stable NYHA III, functional status confounded by body habitus. Volume recently up on device but not leveled  back out, volume OK on exam. - With low BP, stop Entresto . - Start losartan  12.5 mg at bedtime - With recent AF with RVR, stop ivabradine . Discussed with Dr. Rolan and Daphne Barrack, NP with  EP - Continue Lasix  40 mg daily - Continue Toprol  XL 12.5 mg bid - Continue spironolactone  12.5 mg daily.  - No SGLT2i with concern for UTI. - (no Batwire with CRT-D). - Labs from yesterday reviewed and are stable  2. Paroxysmal atrial fibrillation  - AF burden 16% on device interrogation - Continue amiodarone  taper, per EP - Continue Eliquis  5 mg bid. No bleeding issues - Amio labs UTD  3. Type B aortic dissection  - stable  4. Morbid obesity - Body mass index is 49.88 kg/m. - Has been referred to PharmD for [facility], has appt soon  5. Severe OSA/CSA - Continue CPAP - Has appt to discuss GLP1RA  6. PVCs  - Noted on ICD. Burden ~1.2% - Rare PVCs on recent zio - 1 PVC on ECG today. - now on amio  Keep follow up in 2 months with APP, as scheduled.   Harlene CHRISTELLA Gainer, FNP  3:06 PM

## 2024-05-10 ENCOUNTER — Ambulatory Visit (HOSPITAL_COMMUNITY): Payer: Self-pay | Admitting: Family Medicine

## 2024-05-13 ENCOUNTER — Telehealth: Payer: Self-pay

## 2024-05-13 ENCOUNTER — Ambulatory Visit: Payer: Self-pay | Admitting: Pulmonary Disease

## 2024-05-13 ENCOUNTER — Other Ambulatory Visit (HOSPITAL_COMMUNITY): Payer: Self-pay | Admitting: Internal Medicine

## 2024-05-13 NOTE — Telephone Encounter (Signed)
 Reviewed the patient's chart.  She did send a transmission on 05/12/24 which has been processed by [employer] as an alert that was forwarded to triage:  Unscheduled remote transmission:  PII.  No EMR note.  Normal device function 345 AMS events for <1%.  Presents in AFl that converts to SR.  Increase in frequency via trending.  On OAC per EMR. Sending for review.  31 NSVT event, longest 10 sec, 160-170s, likely atrial driven. 1 SVT event x 26 sec, V-rate 155 bpm. Atrial driven tachy.   Outreach made to patient: [name] states she felt well when she woke up this morning, but around ~1030 she could feel it coming on. During episodes of AF the she has some nausea/ head spinning/ chest tightness.   The patient reports a history of tremors, but states they are getting worse and nausea usually occurs around lunchtime and at night. (amiodarone  recently decreased to 200 mg once daily)  BP's have been running low leading to metoprolol  succinate being changed from 25 mg every day to 12.5 mg BID. Entresto  was recently stopped by CHF clinic and the patient was placed on losartan .   Patient BP/ HR: Today: (10:30 am) 145/115 (HR- 138) & (12:00 pm) 118/84 (88) Weekend readings: 87/57 (68), 79/44 (64), 107/65 (?) - patient reports no change in symptoms with lower BP's  Weights: Stable at her baseline of 260 lbs  Medications confirmed: Amiodarone  200 mg QAM Eliquis  5 mg BID Furosemide  40 mg QAM Losartan  12.5 mg QPM Metoprolol  Succinate 12.5 mg BID Spironolactone  12.5 mg every day (patient is not completely sure if she is taking this)  She is aware that her HR/ rhythm is fairly stable, but not greatly improving.  Corvue appears to be trending up and Bi-V pacing is now 85%.   Advised the patient that I am going to forward this information to Dr. Inocencio to review further and we will call back with further recommendations once received.  The patient voices understanding and is agreeable.

## 2024-05-13 NOTE — Telephone Encounter (Signed)
 The pt states she is still having some A-fib events. She had one yesterday and two today.

## 2024-05-14 ENCOUNTER — Encounter: Payer: Self-pay | Admitting: *Deleted

## 2024-05-14 NOTE — Telephone Encounter (Signed)
 Patient calls back today.  She continues to have terrible nausea and has started with some vomiting this morning.  She denies other symptoms.    New start to Amio in June and has recently cut back to the 200mg  once daily dosing.  Suspicion for possible side effect from medication.  She denies any other signs of acute GI illness but she isn't sure where this came from, thought at first she had eaten something bad but has continued over recent days.   Patient has some Zofran  on hand and will start taking that as needed and keep to a bland diet today with increased fluid intake.  Encouraged pedialyte/gatorade if she is vomiting.  Recommended ER if symptoms get worse and do not settle down. Patient verbalizes agreement.   She will see Thom Heinrich PA in the AF clinic tomorrow at 2pm to evaluate AF and treatment options/management.

## 2024-05-15 ENCOUNTER — Inpatient Hospital Stay (HOSPITAL_COMMUNITY)
Admission: EM | Admit: 2024-05-15 | Discharge: 2024-05-31 | DRG: 308 | Disposition: E | Attending: Cardiology | Admitting: Cardiology

## 2024-05-15 ENCOUNTER — Other Ambulatory Visit: Payer: Self-pay

## 2024-05-15 ENCOUNTER — Ambulatory Visit (INDEPENDENT_AMBULATORY_CARE_PROVIDER_SITE_OTHER)
Admission: RE | Admit: 2024-05-15 | Discharge: 2024-05-15 | Disposition: A | Discharge status: Home / Self Care | Source: Ambulatory Visit | Attending: Internal Medicine | Admitting: Internal Medicine

## 2024-05-15 ENCOUNTER — Emergency Department (HOSPITAL_COMMUNITY)

## 2024-05-15 VITALS — BP 108/70 | HR 60 | Ht 61.0 in | Wt 266.2 lb

## 2024-05-15 DIAGNOSIS — I471 Supraventricular tachycardia, unspecified: Secondary | ICD-10-CM | POA: Diagnosis present

## 2024-05-15 DIAGNOSIS — Z8249 Family history of ischemic heart disease and other diseases of the circulatory system: Secondary | ICD-10-CM

## 2024-05-15 DIAGNOSIS — I5084 End stage heart failure: Secondary | ICD-10-CM | POA: Diagnosis present

## 2024-05-15 DIAGNOSIS — R42 Dizziness and giddiness: Secondary | ICD-10-CM | POA: Diagnosis present

## 2024-05-15 DIAGNOSIS — G9341 Metabolic encephalopathy: Secondary | ICD-10-CM | POA: Diagnosis not present

## 2024-05-15 DIAGNOSIS — N939 Abnormal uterine and vaginal bleeding, unspecified: Secondary | ICD-10-CM | POA: Diagnosis present

## 2024-05-15 DIAGNOSIS — Z91048 Other nonmedicinal substance allergy status: Secondary | ICD-10-CM

## 2024-05-15 DIAGNOSIS — E871 Hypo-osmolality and hyponatremia: Secondary | ICD-10-CM | POA: Diagnosis present

## 2024-05-15 DIAGNOSIS — I472 Ventricular tachycardia, unspecified: Principal | ICD-10-CM | POA: Diagnosis present

## 2024-05-15 DIAGNOSIS — D6869 Other thrombophilia: Secondary | ICD-10-CM | POA: Diagnosis present

## 2024-05-15 DIAGNOSIS — Z515 Encounter for palliative care: Secondary | ICD-10-CM

## 2024-05-15 DIAGNOSIS — Z79899 Other long term (current) drug therapy: Secondary | ICD-10-CM

## 2024-05-15 DIAGNOSIS — K7200 Acute and subacute hepatic failure without coma: Secondary | ICD-10-CM | POA: Diagnosis not present

## 2024-05-15 DIAGNOSIS — J690 Pneumonitis due to inhalation of food and vomit: Secondary | ICD-10-CM | POA: Diagnosis not present

## 2024-05-15 DIAGNOSIS — M109 Gout, unspecified: Secondary | ICD-10-CM | POA: Diagnosis present

## 2024-05-15 DIAGNOSIS — R9431 Abnormal electrocardiogram [ECG] [EKG]: Secondary | ICD-10-CM | POA: Insufficient documentation

## 2024-05-15 DIAGNOSIS — J9601 Acute respiratory failure with hypoxia: Secondary | ICD-10-CM | POA: Diagnosis not present

## 2024-05-15 DIAGNOSIS — D696 Thrombocytopenia, unspecified: Secondary | ICD-10-CM | POA: Diagnosis present

## 2024-05-15 DIAGNOSIS — E876 Hypokalemia: Secondary | ICD-10-CM | POA: Diagnosis not present

## 2024-05-15 DIAGNOSIS — H409 Unspecified glaucoma: Secondary | ICD-10-CM | POA: Diagnosis present

## 2024-05-15 DIAGNOSIS — R7989 Other specified abnormal findings of blood chemistry: Secondary | ICD-10-CM

## 2024-05-15 DIAGNOSIS — E875 Hyperkalemia: Secondary | ICD-10-CM | POA: Diagnosis present

## 2024-05-15 DIAGNOSIS — Z83511 Family history of glaucoma: Secondary | ICD-10-CM

## 2024-05-15 DIAGNOSIS — R570 Cardiogenic shock: Secondary | ICD-10-CM | POA: Diagnosis not present

## 2024-05-15 DIAGNOSIS — I4901 Ventricular fibrillation: Secondary | ICD-10-CM | POA: Diagnosis present

## 2024-05-15 DIAGNOSIS — Z886 Allergy status to analgesic agent status: Secondary | ICD-10-CM

## 2024-05-15 DIAGNOSIS — E1151 Type 2 diabetes mellitus with diabetic peripheral angiopathy without gangrene: Secondary | ICD-10-CM | POA: Diagnosis present

## 2024-05-15 DIAGNOSIS — Z66 Do not resuscitate: Secondary | ICD-10-CM | POA: Diagnosis not present

## 2024-05-15 DIAGNOSIS — I4891 Unspecified atrial fibrillation: Secondary | ICD-10-CM | POA: Diagnosis not present

## 2024-05-15 DIAGNOSIS — R011 Cardiac murmur, unspecified: Secondary | ICD-10-CM | POA: Diagnosis present

## 2024-05-15 DIAGNOSIS — I462 Cardiac arrest due to underlying cardiac condition: Secondary | ICD-10-CM | POA: Diagnosis present

## 2024-05-15 DIAGNOSIS — Z79890 Hormone replacement therapy (postmenopausal): Secondary | ICD-10-CM

## 2024-05-15 DIAGNOSIS — F32A Depression, unspecified: Secondary | ICD-10-CM | POA: Diagnosis present

## 2024-05-15 DIAGNOSIS — T451X5A Adverse effect of antineoplastic and immunosuppressive drugs, initial encounter: Secondary | ICD-10-CM | POA: Diagnosis present

## 2024-05-15 DIAGNOSIS — G4733 Obstructive sleep apnea (adult) (pediatric): Secondary | ICD-10-CM | POA: Diagnosis present

## 2024-05-15 DIAGNOSIS — Z86718 Personal history of other venous thrombosis and embolism: Secondary | ICD-10-CM

## 2024-05-15 DIAGNOSIS — I469 Cardiac arrest, cause unspecified: Secondary | ICD-10-CM | POA: Diagnosis not present

## 2024-05-15 DIAGNOSIS — J9602 Acute respiratory failure with hypercapnia: Secondary | ICD-10-CM | POA: Diagnosis not present

## 2024-05-15 DIAGNOSIS — I130 Hypertensive heart and chronic kidney disease with heart failure and stage 1 through stage 4 chronic kidney disease, or unspecified chronic kidney disease: Secondary | ICD-10-CM | POA: Diagnosis present

## 2024-05-15 DIAGNOSIS — K219 Gastro-esophageal reflux disease without esophagitis: Secondary | ICD-10-CM | POA: Diagnosis present

## 2024-05-15 DIAGNOSIS — E039 Hypothyroidism, unspecified: Secondary | ICD-10-CM | POA: Diagnosis present

## 2024-05-15 DIAGNOSIS — I427 Cardiomyopathy due to drug and external agent: Secondary | ICD-10-CM | POA: Diagnosis present

## 2024-05-15 DIAGNOSIS — I5023 Acute on chronic systolic (congestive) heart failure: Secondary | ICD-10-CM | POA: Diagnosis present

## 2024-05-15 DIAGNOSIS — E785 Hyperlipidemia, unspecified: Secondary | ICD-10-CM | POA: Diagnosis present

## 2024-05-15 DIAGNOSIS — Z7901 Long term (current) use of anticoagulants: Secondary | ICD-10-CM | POA: Insufficient documentation

## 2024-05-15 DIAGNOSIS — Z5181 Encounter for therapeutic drug level monitoring: Secondary | ICD-10-CM

## 2024-05-15 DIAGNOSIS — Z9221 Personal history of antineoplastic chemotherapy: Secondary | ICD-10-CM

## 2024-05-15 DIAGNOSIS — I7143 Infrarenal abdominal aortic aneurysm, without rupture: Secondary | ICD-10-CM | POA: Diagnosis present

## 2024-05-15 DIAGNOSIS — Z6841 Body Mass Index (BMI) 40.0 and over, adult: Secondary | ICD-10-CM | POA: Diagnosis not present

## 2024-05-15 DIAGNOSIS — N179 Acute kidney failure, unspecified: Secondary | ICD-10-CM | POA: Diagnosis present

## 2024-05-15 DIAGNOSIS — Z888 Allergy status to other drugs, medicaments and biological substances status: Secondary | ICD-10-CM

## 2024-05-15 DIAGNOSIS — Z95810 Presence of automatic (implantable) cardiac defibrillator: Secondary | ICD-10-CM

## 2024-05-15 DIAGNOSIS — E1122 Type 2 diabetes mellitus with diabetic chronic kidney disease: Secondary | ICD-10-CM | POA: Diagnosis present

## 2024-05-15 DIAGNOSIS — I4729 Other ventricular tachycardia: Secondary | ICD-10-CM | POA: Diagnosis present

## 2024-05-15 DIAGNOSIS — Z923 Personal history of irradiation: Secondary | ICD-10-CM

## 2024-05-15 DIAGNOSIS — I480 Paroxysmal atrial fibrillation: Secondary | ICD-10-CM

## 2024-05-15 DIAGNOSIS — G8929 Other chronic pain: Secondary | ICD-10-CM | POA: Diagnosis present

## 2024-05-15 DIAGNOSIS — Z853 Personal history of malignant neoplasm of breast: Secondary | ICD-10-CM

## 2024-05-15 DIAGNOSIS — Z8619 Personal history of other infectious and parasitic diseases: Secondary | ICD-10-CM

## 2024-05-15 DIAGNOSIS — N189 Chronic kidney disease, unspecified: Secondary | ICD-10-CM | POA: Diagnosis present

## 2024-05-15 DIAGNOSIS — I493 Ventricular premature depolarization: Secondary | ICD-10-CM | POA: Diagnosis present

## 2024-05-15 LAB — CBC
HCT: 36.8 % (ref 36.0–46.0)
Hemoglobin: 12.2 g/dL (ref 12.0–15.0)
MCH: 30.1 pg (ref 26.0–34.0)
MCHC: 33.2 g/dL (ref 30.0–36.0)
MCV: 90.9 fL (ref 80.0–100.0)
Platelets: 116 K/uL — ABNORMAL LOW (ref 150–400)
RBC: 4.05 MIL/uL (ref 3.87–5.11)
RDW: 15.5 % (ref 11.5–15.5)
WBC: 8.8 K/uL (ref 4.0–10.5)
nRBC: 0.8 % — ABNORMAL HIGH (ref 0.0–0.2)

## 2024-05-15 LAB — BASIC METABOLIC PANEL WITH GFR
Anion gap: 16 — ABNORMAL HIGH (ref 5–15)
BUN: 56 mg/dL — ABNORMAL HIGH (ref 8–23)
CO2: 22 mmol/L (ref 22–32)
Calcium: 9.2 mg/dL (ref 8.9–10.3)
Chloride: 89 mmol/L — ABNORMAL LOW (ref 98–111)
Creatinine, Ser: 2.88 mg/dL — ABNORMAL HIGH (ref 0.44–1.00)
GFR, Estimated: 17 mL/min — ABNORMAL LOW (ref >60)
Glucose, Bld: 94 mg/dL (ref 70–99)
Potassium: 5.6 mmol/L — ABNORMAL HIGH (ref 3.5–5.1)
Sodium: 127 mmol/L — ABNORMAL LOW (ref 135–145)

## 2024-05-15 LAB — HEPATIC FUNCTION PANEL
ALT: 1906 U/L — ABNORMAL HIGH (ref 0–44)
AST: 2203 U/L — ABNORMAL HIGH (ref 15–41)
Albumin: 2.8 g/dL — ABNORMAL LOW (ref 3.5–5.0)
Alkaline Phosphatase: 139 U/L — ABNORMAL HIGH (ref 38–126)
Bilirubin, Direct: 0.7 mg/dL — ABNORMAL HIGH (ref 0.0–0.2)
Indirect Bilirubin: 1.1 mg/dL — ABNORMAL HIGH (ref 0.3–0.9)
Total Bilirubin: 1.8 mg/dL — ABNORMAL HIGH (ref 0.0–1.2)
Total Protein: 6.6 g/dL (ref 6.5–8.1)

## 2024-05-15 LAB — TROPONIN I (HIGH SENSITIVITY)
Troponin I (High Sensitivity): 102 ng/L (ref <18)
Troponin I (High Sensitivity): 100 ng/L (ref <18)

## 2024-05-15 LAB — LACTIC ACID, PLASMA
Lactic Acid, Venous: 1.6 mmol/L (ref 0.5–1.9)
Lactic Acid, Venous: 2 mmol/L (ref 0.5–1.9)

## 2024-05-15 LAB — T4, FREE: Free T4: 1.61 ng/dL — ABNORMAL HIGH (ref 0.61–1.12)

## 2024-05-15 LAB — GLUCOSE, CAPILLARY: Glucose-Capillary: 94 mg/dL (ref 70–99)

## 2024-05-15 LAB — MAGNESIUM: Magnesium: 2.3 mg/dL (ref 1.7–2.4)

## 2024-05-15 LAB — BRAIN NATRIURETIC PEPTIDE: B Natriuretic Peptide: 1594.8 pg/mL — ABNORMAL HIGH (ref 0.0–100.0)

## 2024-05-15 LAB — TSH: TSH: 4.5 u[IU]/mL (ref 0.350–4.500)

## 2024-05-15 MED ORDER — SODIUM CHLORIDE 0.9% FLUSH: 3.0000 mL | INTRAVENOUS | Status: DC | PRN

## 2024-05-15 MED ORDER — FUROSEMIDE 10 MG/ML IJ SOLN
40.0000 mg | INTRAMUSCULAR | Status: AC
  Administered 2024-05-15: 40 mg via INTRAVENOUS
  Filled 2024-05-15: qty 4

## 2024-05-15 MED ORDER — ACETAMINOPHEN 325 MG PO TABS: 650.0000 mg | ORAL_TABLET | ORAL | Status: DC | PRN

## 2024-05-15 MED ORDER — SODIUM CHLORIDE 0.9 % IV SOLN: 250.0000 mL | INTRAVENOUS | Status: AC | PRN

## 2024-05-15 MED ORDER — MAGNESIUM SULFATE 2 GM/50ML IV SOLN
2.0000 g | Freq: Once | INTRAVENOUS | Status: AC
  Administered 2024-05-15: 2 g via INTRAVENOUS
  Filled 2024-05-15: qty 50

## 2024-05-15 MED ORDER — FAMOTIDINE 20 MG PO TABS
20.0000 mg | ORAL_TABLET | Freq: Two times a day (BID) | ORAL | Status: DC
Start: 2024-05-15 — End: 2024-05-16
  Administered 2024-05-15: 20 mg via ORAL
  Filled 2024-05-15: qty 1

## 2024-05-15 MED ORDER — SODIUM CHLORIDE 0.9% FLUSH
3.0000 mL | Freq: Two times a day (BID) | INTRAVENOUS | Status: DC
  Administered 2024-05-15 – 2024-05-17 (×4): 3 mL via INTRAVENOUS

## 2024-05-15 MED ORDER — PAROXETINE HCL 30 MG PO TABS
30.0000 mg | ORAL_TABLET | Freq: Every day | ORAL | Status: DC
  Administered 2024-05-15 – 2024-05-16 (×2): 30 mg via ORAL
  Filled 2024-05-15 (×3): qty 1

## 2024-05-15 MED ORDER — LATANOPROST 0.005 % OP SOLN
1.0000 [drp] | Freq: Every day | OPHTHALMIC | Status: DC
  Administered 2024-05-16: 1 [drp] via OPHTHALMIC
  Filled 2024-05-15: qty 2.5

## 2024-05-15 MED ORDER — LEVOTHYROXINE SODIUM 25 MCG PO TABS: 25.0000 ug | ORAL_TABLET | Freq: Every day | ORAL | Status: DC

## 2024-05-15 MED ORDER — LORATADINE 10 MG PO TABS
10.0000 mg | ORAL_TABLET | Freq: Every day | ORAL | Status: DC
  Administered 2024-05-15 – 2024-05-16 (×2): 10 mg via ORAL
  Filled 2024-05-15 (×2): qty 1

## 2024-05-15 MED ORDER — PANTOPRAZOLE SODIUM 40 MG PO TBEC
40.0000 mg | DELAYED_RELEASE_TABLET | Freq: Two times a day (BID) | ORAL | Status: DC
  Administered 2024-05-15: 40 mg via ORAL
  Filled 2024-05-15: qty 1

## 2024-05-15 MED ORDER — POLYETHYL GLYCOL-PROPYL GLYCOL 0.4-0.3 % OP GEL
Freq: Every day | OPHTHALMIC | Status: DC | PRN
Start: 2024-05-15 — End: 2024-05-15

## 2024-05-15 MED ORDER — ALLOPURINOL 300 MG PO TABS
300.0000 mg | ORAL_TABLET | Freq: Every day | ORAL | Status: DC
  Administered 2024-05-16 – 2024-05-17 (×2): 300 mg via ORAL
  Filled 2024-05-15 (×2): qty 1

## 2024-05-15 MED ORDER — AMIODARONE HCL 200 MG PO TABS: ORAL_TABLET | ORAL | Status: DC

## 2024-05-15 MED ORDER — VITAMIN B-12 1000 MCG PO TABS
1000.0000 ug | ORAL_TABLET | Freq: Every day | ORAL | Status: DC
  Administered 2024-05-16 – 2024-05-17 (×2): 1000 ug via ORAL
  Filled 2024-05-15 (×2): qty 1

## 2024-05-15 MED ORDER — AMIODARONE HCL IN DEXTROSE 360-4.14 MG/200ML-% IV SOLN
30.0000 mg/h | INTRAVENOUS | Status: DC
  Administered 2024-05-16 (×2): 30 mg/h via INTRAVENOUS
  Filled 2024-05-15 (×2): qty 200

## 2024-05-15 MED ORDER — AMIODARONE LOAD VIA INFUSION
150.0000 mg | Freq: Once | INTRAVENOUS | Status: AC
  Administered 2024-05-15: 150 mg via INTRAVENOUS
  Filled 2024-05-15: qty 83.34

## 2024-05-15 MED ORDER — MONTELUKAST SODIUM 10 MG PO TABS
10.0000 mg | ORAL_TABLET | Freq: Every day | ORAL | Status: DC
  Administered 2024-05-15 – 2024-05-16 (×2): 10 mg via ORAL
  Filled 2024-05-15 (×2): qty 1

## 2024-05-15 MED ORDER — CHLORHEXIDINE GLUCONATE CLOTH 2 % EX PADS
6.0000 | MEDICATED_PAD | Freq: Every day | CUTANEOUS | Status: DC
  Administered 2024-05-16 – 2024-05-17 (×2): 6 via TOPICAL

## 2024-05-15 MED ORDER — POLYVINYL ALCOHOL 1.4 % OP SOLN: 1.0000 [drp] | OPHTHALMIC | Status: DC | PRN

## 2024-05-15 MED ORDER — ORAL CARE MOUTH RINSE: 15.0000 mL | OROMUCOSAL | Status: DC | PRN

## 2024-05-15 MED ORDER — HEPARIN (PORCINE) 25000 UT/250ML-% IV SOLN
1250.0000 [IU]/h | INTRAVENOUS | Status: DC
  Administered 2024-05-15: 1200 [IU]/h via INTRAVENOUS
  Administered 2024-05-16: 1250 [IU]/h via INTRAVENOUS
  Administered 2024-05-16: 1200 [IU]/h via INTRAVENOUS
  Filled 2024-05-15 (×2): qty 250

## 2024-05-15 MED ORDER — AMIODARONE HCL IN DEXTROSE 360-4.14 MG/200ML-% IV SOLN
60.0000 mg/h | INTRAVENOUS | Status: AC
  Administered 2024-05-15 (×2): 60 mg/h via INTRAVENOUS
  Filled 2024-05-15 (×2): qty 200

## 2024-05-15 MED ORDER — SODIUM FLUORIDE 1.1 % DT CREA: 1.0000 | TOPICAL_CREAM | Freq: Two times a day (BID) | DENTAL | Status: DC

## 2024-05-15 NOTE — ED Triage Notes (Signed)
 Patient BIB [facility] from home for dizziness, nausea, lethargy. Patient has hx of arrhythmias. Patient A&O x 4, endorses chest pain.

## 2024-05-15 NOTE — Progress Notes (Signed)
 Primary Care Physician: Thurmond Cathlyn LABOR., MD Primary Cardiologist: None Electrophysiologist: Brittney Gladis Norton, MD     Referring Physician: Device clinic    [name] is a 73 y.o. female with a history of HTN, breast cancer in 2009 s/p lumpectomy, XRT, and chemotherapy, ICD, Type B aortic dissection (2010), LBBB, systolic HF due to NICM, and paroxysmal atrial fibrillation who presents for consultation in the [facility]rial [facility]. Per history patient previously had an episode of Afib in 2012 when the patient was postop. Device clinic alert on 6/4 due to patient having dizziness and noted to have brief Afib with RVR. Patient has a CHADS2VASC score of 4.  On follow up 05/15/24, patient is currently in AV paced rhythm. She was started on amiodarone  03/2024 in hospital and appears to have had medication confusion after visits last week. She is still taking amiodarone  200 mg BID. Patient's daughter notes still having episodes of Afib despite being on amiodarone . Patient has had worsening tremors, nausea, and vomiting.    Today, she denies symptoms of palpitations, chest pain, shortness of breath, orthopnea, PND, lower extremity edema, presyncope, syncope, snoring, daytime somnolence, bleeding, or neurologic sequela. The patient is tolerating medications without difficulties and is otherwise without complaint today.    she has a BMI of Body mass index is 50.3 kg/m.[name] Filed Weights   05/15/24 1342  Weight: 120.7 kg     Current Outpatient Medications  Medication Sig Dispense Refill   acetaminophen  (TYLENOL ) 500 MG tablet Take 1,000 mg by mouth every 6 (six) hours as needed for mild pain (pain score 1-3), moderate pain (pain score 4-6) or headache.     allopurinol  (ZYLOPRIM ) 300 MG tablet Take 300 mg by mouth daily.     amiodarone  (PACERONE ) 200 MG tablet Take 2 tablets (400 mg total) by mouth 2 (two) times daily for 14 days, THEN 1 tablet (200 mg total) 2 (two) times  daily for 14 days, THEN 1 tablet (200 mg total) daily. 114 tablet 0   apixaban  (ELIQUIS ) 5 MG TABS tablet Take 1 tablet (5 mg total) by mouth 2 (two) times daily. 60 tablet 3   bimatoprost  (LUMIGAN ) 0.03 % ophthalmic solution Place 1 drop into both eyes at bedtime. Reported on 01/12/2016     Calcium  Carbonate-Vitamin D (CALCIUM -VITAMIN D3) 600-200 MG-UNIT TABS Take 1 tablet by mouth 2 (two) times daily.     cetirizine (ZYRTEC) 10 MG tablet Take 10 mg by mouth at bedtime.     Cholecalciferol (VITAMIN D) 125 MCG (5000 UT) CAPS Take 5,000 Units by mouth daily.     clotrimazole-betamethasone (LOTRISONE) cream Apply 1 Application topically 2 (two) times daily as needed (Rash).     colchicine  0.6 MG tablet Take 0.6 mg by mouth daily as needed (Gout flare up).     cyanocobalamin  (VITAMIN B12) 1000 MCG tablet Take 1,000 mcg by mouth daily.     cyclobenzaprine  (FLEXERIL ) 10 MG tablet Take 1 tablet (10 mg total) by mouth 3 (three) times daily as needed for muscle spasms. (Patient taking differently: Take 10 mg by mouth as needed for muscle spasms.) 30 tablet 0   diclofenac  sodium (VOLTAREN ) 1 % GEL APPLY THREE GRAMS TO THREE LARGE JOINTS UP TO THREE TIMES DAILY AS NEEDED 3 Tube 3   famotidine  (PEPCID ) 20 MG tablet Take 20 mg by mouth 2 (two) times daily.     furosemide  (LASIX ) 40 MG tablet Take 1 tablet (40 mg total) by mouth daily.  levothyroxine  (SYNTHROID ) 25 MCG tablet Take 25 mcg by mouth daily.     losartan  (COZAAR ) 25 MG tablet Take 0.5 tablets (12.5 mg total) by mouth at bedtime. 45 tablet 3   metoprolol  succinate (TOPROL -XL) 25 MG 24 hr tablet Take 0.5 tablets (12.5 mg total) by mouth 2 (two) times daily.     montelukast  (SINGULAIR ) 10 MG tablet Take 10 mg by mouth at bedtime. For allergies & congestion     Multiple Vitamins-Minerals (OCUVITE PO) Take 5 mg by mouth daily.     nystatin  (MYCOSTATIN /NYSTOP ) powder Apply topically 2 (two) times daily. 30 g 0   omeprazole  (PRILOSEC) 40 MG capsule TAKE  ONE CAPSULE BY MOUTH TWICE DAILY 60 capsule 8   ondansetron  (ZOFRAN ) 4 MG tablet Take 4 mg by mouth every 8 (eight) hours as needed.     PARoxetine  (PAXIL ) 30 MG tablet Take 30 mg by mouth at bedtime.  1   Polyethyl Glycol-Propyl Glycol (SYSTANE OP) Place 1 drop into both eyes daily as needed (dry eyes).     ranitidine (ZANTAC) 300 MG capsule Take 300 mg by mouth daily as needed. Acid indigestion (Patient taking differently: Take 300 mg by mouth as needed. Acid indigestion)     rosuvastatin  (CRESTOR ) 5 MG tablet TAKE ONE TABLET BY MOUTH DAILY AT 8 PM 90 tablet 3   sodium fluoride  (DENTA 5000 PLUS) 1.1 % CREA dental cream Place 1 Application onto teeth 2 (two) times daily.     spironolactone  (ALDACTONE ) 25 MG tablet Take 0.5 tablets (12.5 mg total) by mouth daily. 15 tablet 3   traMADol  (ULTRAM ) 50 MG tablet Take 50 mg by mouth every 6 (six) hours as needed for pain. (Patient taking differently: Take 50 mg by mouth as needed for pain.)     vitamin E 1000 UNIT capsule Take 1,000 Units by mouth daily.     No current facility-administered medications for this encounter.    Atrial Fibrillation Management history:  Previous antiarrhythmic drugs: amiodarone  Previous cardioversions: none Previous ablations: none Anticoagulation history: Eliquis    ROS- All systems are reviewed and negative except as per the HPI above.  Physical Exam: BP 108/70   Pulse 60   Ht 5' 1 (1.549 m)   Wt 120.7 kg   BMI 50.30 kg/m   GEN- The patient is well appearing, alert and oriented x 3 today.   Neck - no JVD or carotid bruit noted Lungs- Clear to ausculation bilaterally, normal work of breathing Heart- Regular rate and rhythm, no murmurs, rubs or gallops, PMI not laterally displaced Extremities- no clubbing, cyanosis, or edema Skin - no rash or ecchymosis noted   EKG today demonstrates  Vent. rate 60 BPM PR interval 218 ms QRS duration 186 ms QT/QTcB 538/538 ms P-R-T axes 50 132 8 Atrial-sensed  ventricular-paced rhythm with prolonged AV conduction Abnormal ECG When compared with ECG of 08-May-2024 15:12, Previous ECG is present  Echo 04/06/24 demonstrated   1. No mural apical thrombus with definity . Left ventricular ejection  fraction, by estimation, is <20%. The left ventricle has severely  decreased function. The left ventricle demonstrates global hypokinesis.  The left ventricular internal cavity size was  severely dilated. Left ventricular diastolic parameters are indeterminate.   2. Device lead in RV. Right ventricular systolic function is normal. The  right ventricular size is normal.   3. Left atrial size was moderately dilated.   4. Functional MR from LV dysfunction and reduced posterior leaflet motion  apically displaced coaptation point. The mitral  valve is abnormal. Mild to  moderate mitral valve regurgitation. No evidence of mitral stenosis.   5. Mean gradient 4 peak 9 mmHg likely mild low flow AS . The aortic valve  is tricuspid. There is mild calcification of the aortic valve. There is  mild thickening of the aortic valve. Aortic valve regurgitation is not  visualized. Aortic valve sclerosis  is present, with no evidence of aortic valve stenosis.   6. The inferior vena cava is normal in size with greater than 50%  respiratory variability, suggesting right atrial pressure of 3 mmHg.    ASSESSMENT & PLAN CHA2DS2-VASc Score = 4  The patient's score is based upon: CHF History: 1 HTN History: 1 Diabetes History: 0 Stroke History: 0 Vascular Disease History: 0 Age Score: 1 Gender Score: 1       ASSESSMENT AND PLAN: Paroxysmal Atrial Fibrillation (ICD10:  I48.0) The patient's CHA2DS2-VASc score is 4, indicating a 4.8% annual risk of stroke.    She is currently in AV paced rhythm. She appears to have episodes of Afib despite amiodarone  load. We discussed limited options going forward which include AV nodal ablation. With history of VT and prolonged QT  interval, dofetilide would not seem favored at this time. Brittney help arrange follow up with Dr. Inocencio to confirm treatment plan. Continue Toprol  12.5 mg BID.   High risk medication monitoring (ICD10: [id number]) Patient requires ongoing monitoring for anti-arrhythmic medication which has the potential to cause life threatening arrhythmias or AV block. Qtc stable. Transition to amiodarone  200 mg once daily. Advised patient's daughter if still symptomatic then can decrease amiodarone  to 100 mg once daily.    Secondary Hypercoagulable State (ICD10:  D68.69) The patient is at significant risk for stroke/thromboembolism based upon her CHA2DS2-VASc Score of 4.    Continue Eliquis  5 mg BID.   Follow up with Dr. Inocencio to discuss AV nodal ablation.   Terra Pac, PA-C  Afib Clinic [facility] [address] [place], [place] [address] [phone]

## 2024-05-15 NOTE — Patient Instructions (Signed)
 With low BP, stop Entresto . - Start losartan  12.5 mg at bedtime - With recent AF with RVR, stop ivabradine . Discussed with Dr. Rolan and Daphne Barrack, NP with EP - Continue Lasix  40 mg daily - Continue Toprol  XL 12.5 mg bid - Continue spironolactone  12.5 mg daily.  - Continue amiodarone  200 mg once daily - Toprol  12.5 mg BID

## 2024-05-15 NOTE — Progress Notes (Signed)
 PHARMACY - ANTICOAGULATION CONSULT NOTE  Pharmacy Consult for heparin  initiation Indication: atrial fibrillation  Allergies  Allergen Reactions   Wound Dressing Adhesive Other (See Comments)    Blistering of the skin;    Alphagan [Brimonidine]     Irritation    Mobic [Meloxicam] Other (See Comments)    Cannot take due to heart issue   Nsaids Other (See Comments)    Cannot take due to heart issue   Polyvinyl Alcohol  Other (See Comments)    Travatan and Alphagan(irritation)   Statins Other (See Comments)    Per pt her cardiologist told her not to take   Tolmetin Other (See Comments)    Can not take due to heart issue   Travatan Z [Travoprost (Bak Free)]     Irritation   Tape Rash    tape Blister from clear tapes.    Patient Measurements:    Vital Signs: Temp: 97.7 F (36.5 C) (07/16 1937) Temp Source: Oral (07/16 1937) BP: 140/93 (07/16 2030) Pulse Rate: 58 (07/16 2030)  Labs: Recent Labs    05/15/24 1906  HGB 12.2  HCT 36.8  PLT 116*  CREATININE 2.88*  TROPONINIHS 102*    Estimated Creatinine Clearance: 21.1 mL/min (A) (by C-G formula based on SCr of 2.88 mg/dL (H)).   Medical History: Past Medical History:  Diagnosis Date   Aortic dissection (HCC) 12/2008   Type B   Arthritis    lower back   Atrial fibrillation (HCC)    post op (left thoracotomy) 11/12 req amio/DCCV   Breast cancer (HCC) 07/2009   Stage 3 lumpectomy, radiation, chemotherapy; finished chemo October, felt to be in remission   Cardiac defibrillator in place    St. Jude (JA) 9/12; LV lead placement unsuccessful;  s/p left thoracotomy with placement of epicardial LV lead 09/2011 (Dr. PVT)   CHF (congestive heart failure) (HCC)    Chronic systolic dysfunction of left ventricle    Depression    DVT (deep venous thrombosis) (HCC) 10/18/2010   GERD (gastroesophageal reflux disease)    Glaucoma    Hiatal hernia    small hiatal hernia   History of shingles    Hyperlipidemia     Hypertension    Left bundle branch block    Nonischemic cardiomyopathy (HCC)    Admission 12/11 with EF 25%, normal coronary arteries by cath 12/11; NICM presumed to be secondary to chemotherapy for breast cancer   Obesity    [name]    with exertion    Medications:  Apixaban  5mg  BID PTA  Assessment: 73 YOF w/ hx AF presents to ED on home eliquis , to be switched to heparin  while inpatient d/t AKI and acute illness. Per pt, she last took her eliquis  at 1000 this AM. Will plan to start heparin  infusion when next dose of eliquis  would be due. HGB 12.2, PLT 116.   Goal of Therapy:  aPTT 66-102 seconds Monitor platelets by anticoagulation protocol: Yes   Plan:  Start heparin  infusion at 1200 units/hr at 2200 Check aPTT and anti-Xa in 8 hours Will monitor heparin  efficacy via aPTT, as anti-Xa level will be affected by home eliquis  use. Will continue to monitor anti-Xa level until values correlate   Continue to monitor H&H and platelets    Belvie Macintosh, PharmD Candidate 05/15/2024,9:13 PM

## 2024-05-15 NOTE — H&P (Addendum)
 Cardiology Admission History and Physical   Patient ID: [name] MRN: [id number]; DOB: Oct 03, 1951   Admission date: 05/15/2024  PCP:  Thurmond Cathlyn LABOR., MD   [facility] Providers Cardiologist:  None  Electrophysiologist:  Will Gladis Norton, MD  Advanced Heart Failure:  Toribio Fuel, MD      Chief Complaint:  dizziness, nausea, lethargy  Patient Profile: [name] is a 73 y.o. female with chronic HFrEF due to NICM (normal cors 2011), StJude BIV-ICD, SVT, PAF, VT/VF, breast CA s/p lumpectomy/XRT/chemotherapy, HTN, Type B aortic dissection (2010) managed with medical therapy by Dr. 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  History of Present Illness:  She was diagnosed with breast cancer in 2009 s/p lumpectomy, XRT, and chemotherapy. Diagnosed with CHF EF 25-30% in 2011 with normal coronaries. EF has been low as 10-15% per notes. 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  She underwent St Jude Bi-V ICD implantation Sept 2012 but unable to place LV lead. She eventually brought back in Nov 2012 for left thoracotomy and placement of epicardial LV lead placement by Dr. Fleeta Ochoa. Previously seen in allergy clinic due to chronic cough, possible laryngeal reflux disease, started PPI/H2 blocker. question also raised about possible trial off ARNI though has been on this in recent years. She had ICD generator changed out in 05/2023. Cardiac monitor in 12/2023 showed 3 episodes of SVT, longest lasting 2 min 46 sec. Sleep study 12/2023 showed progression to severe OSA, started on BIPAP.  Device alert from 6/4 showed that  patient had a brief episode of afib with RVR. She was symptomatic during this episode with dizziness. She was seen by the afib clinic on 6/6, started on Eliquis . She has had multiple admissions lately in June: - 6/6-6/9 with near-syncope, 30lb weight loss (intentional), felt to be orthostatic hypotension,  no arrhythmias noted on device interrogation aside from high PVC burden - 6/11-6/13 with multiple shocks, interrogation showed AF RVR that devolved into VT/VF, for which she received 3 shocks Loaded with IV amio, later transitioned to po and discharged home.  - Re-admitted 3 days later 6/16-6/20 with ICD shocks and LOC. Felt to have rapid AF again that devolved into VT. She was loaded again with IV amio. Echo showed EF < 20%, moderate MR felt functional, mild low flow AS. Device settings adjusted, discussed possibility of AVJ ablation. Amio transitioned to PO and she was discharged home, weight 261 lbs.  At post-hospital HF follow-up 7/9, Entresto  changed to losartan  due to low BP. With recent AF RVR, ivabradine  was discontinued. Not on SGLT2i due to concern for UTI. In the interim she has been having terrible nausea and episodic vomiting. Phone note from 7/14 references device interrogation 7/13 with Normal device function. 345 AMS events for <1%.  Presents in AFl that converts to SR.  Increase in frequency via trending.  On OAC per EMR. Sending for review. 31 NSVT event, longest 10 sec, 160-170s, likely atrial driven. 1 SVT event x 26 sec, V-rate 155 bpm. Atrial driven tachy. She was scheduled for follow-up with the atrial fibrillation clinic today and APP noted patient still having episodes on amiodarone  - she confirmed still taking 200mg   BID. It was felt she may need to consider ablation and f/u with Dr. Inocencio was planned, recommended to transition to amiodarone  200mg  daily given nausea. She was in a-sensed, v-paced rhythm.  However, in the meantime, she began to develop shortness of breath later in  the day associated with increased dizziness. EMS was called and she was noted to be in wide complex rhythm/VT. She was in VT HR 128bpm, Per EDP report she spontaneously converted to NSR prior to intervention taking. Pt/family deny ICD shock, but formal interrogation pending. Per daughter she has required assistance with ADLs for quite some time but has been significantly more weak, lethargic and dizzy the last week. Periodically she would doze off then startle awake. She also has had progression of a left arm tremor that's been there for a while. The patient is able to answer questions but intermittently dozes off. Denies chest pain. Denies recent weight gain, + some LE edema and orthopnea. F/u EKG shows atrial sensed V paced rhythm 60bpm QTc in setting of QRS . Nurse reports device interrogation from [facility] showed today SVT (?sustained VT) from 6-710 pm, intermittent episodes the last 3 days, no electrograms available, trending wet by impedence. Started on IV amio + IV mag in ED.   Labs: Hgb 12.2, plt 116 (intermittently low in the past), Na 127, K 5.6, Cr 2.88 (up from 1.46 8 days ago, normal at DC 04/19/24), TSH OK, Mg OK, hsTroponin 102 - recently elevated up to 209 in June. CXR Underinflation with enlarged cardiac silhouette with vascular congestion. Last SBP 95-140, variable.  Past Medical History:  Diagnosis Date   Aortic dissection (HCC) 12/2008   Type B   Arthritis    lower back   Atrial fibrillation (HCC)    post op (left thoracotomy) 11/12 req amio/DCCV   Breast cancer (HCC) 07/2009   Stage 3 lumpectomy, radiation, chemotherapy; finished chemo October, felt to be in remission   Cardiac defibrillator in place    St. Jude (JA) 9/12; LV lead placement unsuccessful;  s/p left thoracotomy with placement of epicardial LV lead 09/2011 (Dr. PVT)   CHF (congestive heart failure) (HCC)    Chronic systolic dysfunction of left ventricle    Depression    DVT (deep venous thrombosis) (HCC)  10/18/2010   GERD (gastroesophageal reflux disease)    Glaucoma    Hiatal hernia    small hiatal hernia   History of shingles    Hyperlipidemia    Hypertension    Left bundle branch block    Nonischemic cardiomyopathy (HCC)    Admission 12/11 with EF 25%, normal coronary arteries by cath 12/11; NICM presumed to be secondary to chemotherapy for breast cancer   Obesity    [name]    with exertion   Past Surgical History:  Procedure Laterality Date   BIV ICD GENERATOR CHANGEOUT N/A 05/22/2023   Procedure: BIV ICD GENERATOR CHANGEOUT;  Surgeon: Inocencio Soyla Lunger, MD;  Location: [facility] INVASIVE CV LAB;  Service: Cardiovascular;  Laterality: N/A;   BREAST LUMPECTOMY     Right breast   CARDIAC CATHETERIZATION     2011   CARDIAC DEFIBRILLATOR PLACEMENT     EP IMPLANTABLE DEVICE N/A 01/12/2016   STJ ICD Unify Assura gen change, Dr. Kelsie   OOPHORECTOMY     Single   THORACOTOMY  09/16/2011   Procedure: THORACOTOMY MAJOR;  Surgeon: Maude Fleeta Hanford DOUGLAS, MD;  Location: [facility] OR;  Service: Thoracic;  Laterality: Left;  left anterolateral  Thoracotomy for placement of St. Jude epicardial pacing lead    TONSILLECTOMY     TUBAL LIGATION     Bilateral     Medications Prior to Admission: Prior to Admission medications   Medication Sig Start Date End Date Taking? Authorizing Provider  acetaminophen  (TYLENOL ) 500 MG tablet Take 1,000 mg by mouth every 6 (six) hours as needed for mild pain (pain score 1-3), moderate pain (pain score 4-6) or headache.    [provider]  allopurinol  (ZYLOPRIM ) 300 MG tablet Take 300 mg by mouth daily.    [provider]  amiodarone  (PACERONE ) 200 MG tablet Take 1 tablet by mouth daily 05/15/24   Terra Fairy PARAS, PA-C  apixaban  (ELIQUIS ) 5 MG TABS tablet Take 1 tablet (5 mg total) by mouth 2 (two) times daily. 04/05/24   Terra Fairy PARAS, PA-C  bimatoprost  (LUMIGAN ) 0.03 % ophthalmic solution Place 1 drop into both eyes at bedtime. Reported on  01/12/2016    [provider]  Calcium  Carbonate-Vitamin D (CALCIUM -VITAMIN D3) 600-200 MG-UNIT TABS Take 1 tablet by mouth 2 (two) times daily.    [provider]  cetirizine (ZYRTEC) 10 MG tablet Take 10 mg by mouth at bedtime. 04/08/19   [provider]  Cholecalciferol (VITAMIN D) 125 MCG (5000 UT) CAPS Take 5,000 Units by mouth daily.    [provider]  clotrimazole-betamethasone (LOTRISONE) cream Apply 1 Application topically 2 (two) times daily as needed (Rash).    [provider]  colchicine  0.6 MG tablet Take 0.6 mg by mouth daily as needed (Gout flare up).    [provider]  cyanocobalamin  (VITAMIN B12) 1000 MCG tablet Take 1,000 mcg by mouth daily.    [provider]  cyclobenzaprine  (FLEXERIL ) 10 MG tablet Take 1 tablet (10 mg total) by mouth 3 (three) times daily as needed for muscle spasms. Patient taking differently: Take 10 mg by mouth as needed for muscle spasms. 12/27/13   Bensimhon, Toribio SAUNDERS, MD  diclofenac  sodium (VOLTAREN ) 1 % GEL APPLY THREE GRAMS TO THREE LARGE JOINTS UP TO THREE TIMES DAILY AS NEEDED 04/04/18   Cheryl Waddell HERO, PA-C  famotidine  (PEPCID ) 20 MG tablet Take 20 mg by mouth 2 (two) times daily.    [provider]  furosemide  (LASIX ) 40 MG tablet Take 1 tablet (40 mg total) by mouth daily. 05/01/24   Clegg, Amy D, NP  levothyroxine  (SYNTHROID ) 25 MCG tablet Take 25 mcg by mouth daily. 01/15/20   [provider]  losartan  (COZAAR ) 25 MG tablet Take 0.5 tablets (12.5 mg total) by mouth at bedtime. 05/08/24 08/06/24  Glena Harlene HERO, FNP  metoprolol  succinate (TOPROL -XL) 25 MG 24 hr tablet Take 0.5 tablets (12.5 mg total) by mouth 2 (two) times daily. 05/07/24   Aniceto Daphne CROME, NP  montelukast  (SINGULAIR ) 10 MG tablet Take 10 mg by mouth at bedtime. For allergies & congestion    [provider]  Multiple Vitamins-Minerals (OCUVITE PO) Take 5 mg by mouth daily.    [provider]   nystatin  (MYCOSTATIN /NYSTOP ) powder Apply topically 2 (two) times daily. 04/08/24   Gherghe, Costin M, MD  omeprazole  (PRILOSEC) 40 MG capsule TAKE ONE CAPSULE BY MOUTH TWICE DAILY 08/04/20   Kozlow, Camellia PARAS, MD  ondansetron  (ZOFRAN ) 4 MG tablet Take 4 mg by mouth every 8 (eight) hours as needed. 05/01/24   [provider]  PARoxetine  (PAXIL ) 30 MG tablet Take 30 mg by mouth at bedtime. 02/10/16   [provider]  Polyethyl Glycol-Propyl Glycol (SYSTANE OP) Place 1 drop into both eyes daily as needed (dry eyes).    [provider]  ranitidine (ZANTAC) 300 MG capsule Take 300 mg by mouth daily as needed. Acid indigestion Patient taking differently: Take 300 mg by mouth as needed. Acid indigestion 05/07/24 05/08/25  Aniceto Daphne CROME, NP  rosuvastatin  (CRESTOR ) 5 MG tablet TAKE ONE TABLET BY MOUTH DAILY AT 8 PM 05/13/24   Bensimhon, Toribio SAUNDERS, MD  sodium fluoride  (DENTA 5000 PLUS) 1.1 % CREA dental cream Place 1 Application onto teeth 2 (two) times daily.    [provider]  spironolactone  (ALDACTONE ) 25 MG tablet Take 0.5 tablets (12.5 mg total) by mouth daily. 04/09/24   Bensimhon, Toribio SAUNDERS, MD  traMADol  (ULTRAM ) 50 MG tablet Take 50 mg by mouth every 6 (six) hours as needed for pain. Patient taking differently: Take 50 mg by mouth as needed for pain.    [provider]  vitamin E 1000 UNIT capsule Take 1,000 Units by mouth daily.    [provider]     Allergies:    Allergies  Allergen Reactions   Wound Dressing Adhesive Other (See Comments)    Blistering of the skin;    Alphagan [Brimonidine]     Irritation    Mobic [Meloxicam] Other (See Comments)    Cannot take due to heart issue   Nsaids Other (See Comments)    Cannot take due to heart issue   Polyvinyl Alcohol  Other (See Comments)    Travatan and Alphagan(irritation)   Statins Other (See Comments)    Per pt her cardiologist told her not to take   Tolmetin Other (See Comments)    Can not  take due to heart issue   Travatan Z [Travoprost (Bak Free)]     Irritation   Tape Rash    tape Blister from clear tapes.    Social History:   Social History   Socioeconomic History   Marital status: Married    Spouse name: Not on file   Number of children: Not on file   Years of education: Not on file   Highest education level: Not on file  Occupational History   Occupation: Radio producer: [employer]   Occupation: Retired from Photographer  Tobacco Use   Smoking status: Never   Smokeless tobacco: Never  Vaping Use   Vaping status: Never Used  Substance and Sexual Activity   Alcohol  use: No   Drug use: No   Sexual activity: Not on file  Other Topics Concern   Not on file  Social History Narrative   Married   One child   Lives in [place] with spouse and mother in law   Previously worked as a Public librarian for [employer]      She is a Social worker counsel member.   Social Drivers of Corporate investment banker Strain: Not on file  Food Insecurity: No Food Insecurity (04/16/2024)   Hunger Vital Sign    Worried About Running Out of Food in the Last Year: Never true    Ran Out of Food in the Last Year: Never true  Transportation Needs: No Transportation Needs (04/16/2024)   PRAPARE - Administrator, Civil Service (Medical): No    Lack of Transportation (Non-Medical): No  Physical Activity: Not on file  Stress: Not on file  Social Connections: Socially Integrated (04/16/2024)   Social Connection and Isolation Panel  Frequency of Communication with Friends and Family: More than three times a week    Frequency of Social Gatherings with Friends and Family: Once a week    Attends Religious Services: 1 to 4 times per year    Active Member of [employer] or Organizations: Yes    Attends Banker Meetings: 1 to 4 times per year    Marital Status: Married  Catering manager Violence: Not At Risk (04/16/2024)   Humiliation, Afraid, Rape, and  Kick questionnaire    Fear of Current or Ex-Partner: No    Emotionally Abused: No    Physically Abused: No    Sexually Abused: No     Family History:   The patient's family history includes Atrial fibrillation in her mother; Coronary artery disease in her maternal grandfather and paternal grandfather; Glaucoma in her mother; Heart attack in her maternal grandfather and paternal grandfather; Hypertension in her brother, brother, father, maternal grandfather, mother, and paternal grandfather.    ROS:  Please see the history of present illness.  All other ROS reviewed and negative.     Physical Exam/Data: Vitals:   05/15/24 1900 05/15/24 1915  BP: (!) 148/131 103/66  Pulse: (!) 126 (!) 58  Resp: 19 (!) 25  SpO2: 96% 98%   No intake or output data in the 24 hours ending 05/15/24 1931    05/15/2024    1:42 PM 05/08/2024    3:01 PM 05/07/2024    9:28 AM  Last 3 Weights  Weight (lbs) 266 lb 3.2 oz 264 lb 261 lb 12.8 oz  Weight (kg) 120.748 kg 119.75 kg 118.752 kg     There is no height or weight on file to calculate BMI.  General: Pale appearing obese WF in no acute distress. Head: Normocephalic, atraumatic, sclera non-icteric, no xanthomas, nares are without discharge. Neck: Negative for carotid bruits. JVP difficult to assess with habitus Lungs: Clear bilaterally to auscultation without wheezes, rales, or rhonchi. Breathing is unlabored. Heart: RRR S1 S2 without murmurs, rubs, or gallops.  Abdomen: Soft, non-tender, non-distended with normoactive bowel sounds. No rebound/guarding. Extremities: No clubbing or cyanosis. Soft pale puffy BLE edema superimposed on large baseline leg habitus Neuro: Alert and oriented X 3. Moves all extremities spontaneously. Psych:  Responds to questions appropriately with a normal affect.   EKG:  The ECG that was done today was personally reviewed and demonstrates Wide complex tachycardia suspected VT 128bpm with diffuse STTW changes  Relevant CV  Studies: 2d echo 03/2024   1. No mural apical thrombus with definity . Left ventricular ejection  fraction, by estimation, is <20%. The left ventricle has severely  decreased function. The left ventricle demonstrates global hypokinesis.  The left ventricular internal cavity size was  severely dilated. Left ventricular diastolic parameters are indeterminate.   2. Device lead in RV. Right ventricular systolic function is normal. The  right ventricular size is normal.   3. Left atrial size was moderately dilated.   4. Functional MR from LV dysfunction and reduced posterior leaflet motion  apically displaced coaptation point. The mitral valve is abnormal. Mild to  moderate mitral valve regurgitation. No evidence of mitral stenosis.   5. Mean gradient 4 peak 9 mmHg likely mild low flow AS . The aortic valve  is tricuspid. There is mild calcification of the aortic valve. There is  mild thickening of the aortic valve. Aortic valve regurgitation is not  visualized. Aortic valve sclerosis  is present, with no evidence of aortic valve stenosis.  6. The inferior vena cava is normal in size with greater than 50%  respiratory variability, suggesting right atrial pressure of 3 mmHg.    Laboratory Data: High Sensitivity Troponin:   Recent Labs  Lab 04/15/24 2238  TROPONINIHS 21*      ChemistryNo results for input(s): NA, K, CL, CO2, GLUCOSE, BUN, CREATININE, CALCIUM , MG, GFRNONAA, GFRAA, ANIONGAP in the last 168 hours.  No results for input(s): PROT, ALBUMIN, AST, ALT, ALKPHOS, BILITOT in the last 168 hours. Lipids No results for input(s): CHOL, TRIG, HDL, LABVLDL, LDLCALC, CHOLHDL in the last 168 hours. HematologyNo results for input(s): WBC, RBC, HGB, HCT, MCV, MCH, MCHC, RDW, PLT in the last 168 hours. Thyroid  No results for input(s): TSH, FREET4 in the last 168 hours. BNPNo results for input(s): BNP, PROBNP in the last  168 hours.  DDimer No results for input(s): DDIMER in the last 168 hours.  Radiology/Studies:  No results found.  Assessment and Plan:  1. Acute on chronic HFrEF with borderline cardiogenic shock appearance, NICM, nausea with concern for low output heart failure, +/- related to amio use 2. Recurrence of ventricular tachycardia, with recent history of VT/VF in setting of progression from atrial fibrillation, StJ BiV-ICD - patient seen/discussed with Dr. Lavona, very complex ill female. Plan below is per our discussion - admit to 2H, place PICC, obtain CVP and Co-ox, lactic acid trends and give 40mg  IV Lasix  x1 now - continue IV amiodarone  in place or oral amiodarone  for now - switch Eliquis  to heparin  per pharmacy given AKI, acute illness - NPO except few sips of water - hold loartan, spironolactone  - hold metoprolol  due to concern for low output - have signed out to cardiology fellow to f/u CVP/Co-ox and reassess patient this evening for possible need for inotropes - Purewick if needed  3. AKI with hyperkalemia, hyponatremia - hold losartan  and spironolactone  - per discussion with Dr. Lavona, hold off on rx for hyperkalemia, utilize Lasix  as above, trend 2am BMET - have signed out to on call fellow to review  4. Mildly elevated troponin 102 - suspect demand ischemia from the above, no chest pain. Will defer to HF team to review if updated ischemic w/u needs to be considered  5. PVD s/p remote type B aortic dissection, prior infrarenal AAA extending into iliac arteries - follow clinically - LFTs pending, hold off statin and reassess trend in AM for resumption  6. Chronic appearing thrombocytopenia with slight worsening - follow with labs  7. Severe OSA - BiPAP with sleep  8. Depression, chronic pain, tremor - OK to continue Paxil  per d/w MD - follow Crcl tomorrow for dosing - hold off PRN tramadol  acutely given drowsiness - discussed nighttime startling with MD, not eager  to add additional sedating med at this time  9. Hypothyroidism - TSH OK, continue rosuvastatin   Held calcium /vit D, Voltaren , multivitamin, vit E pending renal trends in AM. CHF team should review home med list in AM and adjust doses accordingly if needed based on renal trajectory.  Risk Assessment/Risk Scores:      New York  Heart Association (NYHA) Functional Class NYHA Class IV  CHA2DS2-VASc Score = 4   This indicates a 4.8% annual risk of stroke. The patient's score is based upon: CHF History: 1 HTN History: 1 Diabetes History: 0 Stroke History: 0 Vascular Disease History: 0 Age Score: 1 Gender Score: 1    Code Status: Full Code, confirmed with patient and family  Severity of Illness: The appropriate patient status  for this patient is INPATIENT. Inpatient status is judged to be reasonable and necessary in order to provide the required intensity of service to ensure the patient's safety. The patient's presenting symptoms, physical exam findings, and initial radiographic and laboratory data in the context of their chronic comorbidities is felt to place them at high risk for further clinical deterioration. Furthermore, it is not anticipated that the patient will be medically stable for discharge from the hospital within 2 midnights of admission.   * I certify that at the point of admission it is my clinical judgment that the patient will require inpatient hospital care spanning beyond 2 midnights from the point of admission due to high intensity of service, high risk for further deterioration and high frequency of surveillance required.*  For questions or updates, please contact [facility] Please consult [email or web address] for contact info under     Signed, Shadrack Brummitt N Joleigh Mineau, PA-C  05/15/2024 7:31 PM   History and all data above reviewed.  I personally took the history today, performed the physical exam and formulated the assessment and plan.  This lovely patient has had a  long history of non ischemic CM.  She has not had frequent hospitalizations with decompensated HF however.  She has had recent VT as reported above.  At discharge in June her husband says she was able to get up from the car and use her rolling walker into the house.  However, over the past week she has had increased dizziness.  She ahs had progressive weakness and somnolence.  She had been losing weight intentionally and does not think she has had increased weight or swelling.  She has had more DOE but not PND or orthopnea.  She was in the A Fib clinic today and amio was reduced.  She had been sent home on 400 bid with plans to taper but had not.  She was reduced to 400 daily.  However, this evening she was acutely weaker.  EMS was called and she was in wide complex and converted to NSR but on arrival in the ED was in wide complex again.  Prelim interrogation of the device indicates intermittent VT with 70 minutes sustained.  She also has impedence suggesting volume overload.  Labs just returned demonstrate hyponatremia, hyperkalemia and AKI.  Initial BP was SBP 90s She is not reporting acute SOB.  She is somnolent but answering questions well.  She denies chest pain.   I reviewed all relevant tests and studies. Patient examined.  I agree with the findings as above.  The patient exam reveals COR:RRR  ,  Lungs: Clear, shallow breaths, Neck:  Difficult to asses JVD  ,  Abd: Positive bowel sounds, no rebound no guarding, Ext Non pittingedema.    .  All available labs, radiology testing, previous records reviewed. Agree with documented assessment and plan. Acute on chronic systolic HF with low output HF/cardiorenal.   Will hold Entresto  beta blocker.  Admit to ICU.  Check lactic acid, CoOx CVP.  BP seems to be improved with IV amio and maintenance of NSR.  Last BP was up to 140 systolic with NSR.  Repeat BMET in 6 hours.  Oxygenating well and will hold off on Lasix  until we see the CVP.  Discussed with Dr. Lenon  night coverage.  Discussed with the patient, daughter and husband.  She is full code and would want to be intubated.   EP to follow up for further management of VT.  Device interrogation not  yet in the computer.  I will hold Eliquis  and start heparin .   Lynwood Schilling  8:58 PM  05/15/2024

## 2024-05-15 NOTE — ED Notes (Addendum)
 Interrogation phone call: pt was today VT 6pm-710pm went back sinus, noted runs of VT x3 days. Multiple PVCs have been noted. Stated that patient may be collecting fluid, wet   2%

## 2024-05-15 NOTE — ED Notes (Signed)
 On arrival to the ED patient was placed on cardiac monitor and noted to be in v-tach. Patient appeared calm, in no distress and reported some chest pain. Provider made aware and arrived at bedside while patient was placed on defibrillator and EKG was documented. At around 1910 patient spontaneously converted to sinus rhythm. Patient reported no difference in symptoms after conversion and continues to have increased work of breathing without reporting shortness of breath.

## 2024-05-15 NOTE — ED Provider Notes (Addendum)
 [place] [facility]GENC[facility]PARTMENT AT [facility] Provider Note   CSN: [id number] Arrival date & time: 05/15/24  1851     Patient presents with: Dizziness, Nausea, and Irregular Heart Beat   [name] is a 73 y.o. female.    Dizziness Associated symptoms: shortness of breath      73 y.o. female with chronic HFrEF due to NICM (normal cors 2011), StJude BIV-ICD, SVT, PAF, VT/VF, breast CA s/p lumpectomy/XRT/chemotherapy, HTN, Type B aortic dissection (2010) managed with medical therapy scented to the Emergency Department with shortness of breath.  The patient was just at the A-fib clinic earlier today.  She felt fine and then after her clinic visit started to develop shortness of breath.  EMS found her to be in a wide-complex rhythm.  On arrival, the patient appeared to be in ventricular tachycardia which self converted without an immediate intervention.  On repeat assessment, the patient denies any symptoms at this time.  Prior to Admission medications   Medication Sig Start Date End Date Taking? Authorizing Provider  acetaminophen  (TYLENOL ) 500 MG tablet Take 1,000 mg by mouth every 6 (six) hours as needed for mild pain (pain score 1-3), moderate pain (pain score 4-6) or headache.    [provider]  allopurinol  (ZYLOPRIM ) 300 MG tablet Take 300 mg by mouth daily.    [provider]  amiodarone  (PACERONE ) 200 MG tablet Take 1 tablet by mouth daily 05/15/24   Suarez, Joseph J, PA-C  apixaban  (ELIQUIS ) 5 MG TABS tablet Take 1 tablet (5 mg total) by mouth 2 (two) times daily. 04/05/24   Terra Fairy PARAS, PA-C  Calcium  Carbonate-Vitamin D (CALCIUM -VITAMIN D3) 600-200 MG-UNIT TABS Take 1 tablet by mouth 2 (two) times daily.    [provider]  cetirizine (ZYRTEC) 10 MG tablet Take 10 mg by mouth at bedtime. 04/08/19   [provider]  Cholecalciferol (VITAMIN D) 125 MCG (5000 UT) CAPS Take 5,000 Units by mouth daily.    [provider]   clotrimazole-betamethasone (LOTRISONE) cream Apply 1 Application topically 2 (two) times daily as needed (Rash).    [provider]  colchicine  0.6 MG tablet Take 0.6 mg by mouth daily as needed (Gout flare up).    [provider]  cyanocobalamin  (VITAMIN B12) 1000 MCG tablet Take 1,000 mcg by mouth daily.    [provider]  cyclobenzaprine  (FLEXERIL ) 10 MG tablet Take 1 tablet (10 mg total) by mouth 3 (three) times daily as needed for muscle spasms. Patient taking differently: Take 10 mg by mouth as needed for muscle spasms. 12/27/13   Bensimhon, Toribio SAUNDERS, MD  diclofenac  sodium (VOLTAREN ) 1 % GEL APPLY THREE GRAMS TO THREE LARGE JOINTS UP TO THREE TIMES DAILY AS NEEDED 04/04/18   Cheryl Waddell HERO, PA-C  famotidine  (PEPCID ) 20 MG tablet Take 20 mg by mouth 2 (two) times daily.    [provider]  furosemide  (LASIX ) 40 MG tablet Take 1 tablet (40 mg total) by mouth daily. 05/01/24   Clegg, Amy D, NP  levothyroxine  (SYNTHROID ) 25 MCG tablet Take 25 mcg by mouth daily. 01/15/20   [provider]  losartan  (COZAAR ) 25 MG tablet Take 0.5 tablets (12.5 mg total) by mouth at bedtime. 05/08/24 08/06/24  Glena Harlene HERO, FNP  LUMIGAN  0.01 % SOLN Place 1 drop into both eyes at bedtime. 04/30/24   [provider]  metoprolol  succinate (TOPROL -XL) 25 MG 24 hr tablet Take 0.5 tablets (12.5 mg total) by mouth 2 (two)  times daily. 05/07/24   Aniceto Daphne CROME, NP  montelukast  (SINGULAIR ) 10 MG tablet Take 10 mg by mouth at bedtime. For allergies & congestion    [provider]  Multiple Vitamins-Minerals (OCUVITE PO) Take 5 mg by mouth daily.    [provider]  nystatin  (MYCOSTATIN /NYSTOP ) powder Apply topically 2 (two) times daily. 04/08/24   Gherghe, Costin M, MD  omeprazole  (PRILOSEC) 40 MG capsule TAKE ONE CAPSULE BY MOUTH TWICE DAILY 08/04/20   Kozlow, Camellia PARAS, MD  ondansetron  (ZOFRAN ) 4 MG tablet Take 4 mg by mouth every 8 (eight) hours as needed.  05/01/24   [provider]  PARoxetine  (PAXIL ) 30 MG tablet Take 30 mg by mouth at bedtime. 02/10/16   [provider]  Polyethyl Glycol-Propyl Glycol (SYSTANE OP) Place 1 drop into both eyes daily as needed (dry eyes).    [provider]  ranitidine (ZANTAC) 300 MG capsule Take 300 mg by mouth daily as needed. Acid indigestion Patient taking differently: Take 300 mg by mouth as needed. Acid indigestion 05/07/24 05/08/25  Aniceto Daphne CROME, NP  rosuvastatin  (CRESTOR ) 5 MG tablet TAKE ONE TABLET BY MOUTH DAILY AT 8 PM 05/13/24   Bensimhon, Toribio SAUNDERS, MD  sodium fluoride  (DENTA 5000 PLUS) 1.1 % CREA dental cream Place 1 Application onto teeth 2 (two) times daily.    [provider]  spironolactone  (ALDACTONE ) 25 MG tablet Take 0.5 tablets (12.5 mg total) by mouth daily. 04/09/24   Bensimhon, Toribio SAUNDERS, MD  traMADol  (ULTRAM ) 50 MG tablet Take 50 mg by mouth every 6 (six) hours as needed for pain. Patient taking differently: Take 50 mg by mouth as needed for pain.    [provider]  vitamin E 1000 UNIT capsule Take 1,000 Units by mouth daily.    [provider]    Allergies: Wound dressing adhesive, Alphagan [brimonidine], Mobic [meloxicam], Nsaids, Polyvinyl alcohol , Statins, Tolmetin, Travatan z [travoprost (bak free)], and Tape    Review of Systems  Unable to perform ROS: Acuity of condition  Respiratory:  Positive for shortness of breath.   Neurological:  Positive for dizziness.  All other systems reviewed and are negative.   Updated Vital Signs BP (!) 140/93   Pulse (!) 58   Temp 97.7 F (36.5 C) (Oral)   Resp 14   SpO2 98%   Physical Exam Vitals and nursing note reviewed.  Constitutional:      General: She is not in acute distress.    Appearance: She is ill-appearing. She is not diaphoretic.     Comments: Ashen gray, ill appearing, GCS 15, still mentating appropriately  HENT:     Head: Normocephalic and atraumatic.  Eyes:      Conjunctiva/sclera: Conjunctivae normal.     Pupils: Pupils are equal, round, and reactive to light.  Cardiovascular:     Rate and Rhythm: Regular rhythm. Tachycardia present.     Pulses: Normal pulses.  Pulmonary:     Effort: Pulmonary effort is normal. No respiratory distress.  Abdominal:     General: There is no distension.     Tenderness: There is no guarding.  Musculoskeletal:        General: No deformity or signs of injury.     Cervical back: Neck supple.  Skin:    Findings: No lesion or rash.  Neurological:     General: No focal deficit present.     Mental Status: She is alert. Mental status is at baseline.     (all  labs ordered are listed, but only abnormal results are displayed) Labs Reviewed  BASIC METABOLIC PANEL WITH GFR - Abnormal; Notable for the following components:      Result Value   Sodium 127 (*)    Potassium 5.6 (*)    Chloride 89 (*)    BUN 56 (*)    Creatinine, Ser 2.88 (*)    GFR, Estimated 17 (*)    Anion gap 16 (*)    All other components within normal limits  CBC - Abnormal; Notable for the following components:   Platelets 116 (*)    nRBC 0.8 (*)    All other components within normal limits  T4, FREE - Abnormal; Notable for the following components:   Free T4 1.61 (*)    All other components within normal limits  TROPONIN I (HIGH SENSITIVITY) - Abnormal; Notable for the following components:   Troponin I (High Sensitivity) 102 (*)    All other components within normal limits  MAGNESIUM   TSH  HEPATIC FUNCTION PANEL  LACTIC ACID, PLASMA  LACTIC ACID, PLASMA  BASIC METABOLIC PANEL WITH GFR  COOXEMETRY PANEL  COOXEMETRY PANEL  COMPREHENSIVE METABOLIC PANEL WITH GFR  MAGNESIUM   CBC  TROPONIN I (HIGH SENSITIVITY)    EKG: None  Radiology: US  EKG SITE RITE Result Date: 05/15/2024 If Site Rite image not attached, placement could not be confirmed due to current cardiac rhythm.  DG Chest Port 1 View Result Date: 05/15/2024 CLINICAL  DATA:  [name] EXAM: PORTABLE CHEST 1 VIEW COMPARISON:  X-ray 04/15/2024 and older.  CTA chest 04/10/2024. FINDINGS: Underinflation. Enlarged cardiopericardial silhouette with some vascular congestion and bronchial wall thickening. No pneumothorax or effusion. No separate consolidation. Left upper chest battery pack with defibrillator leads. Overlapping cardiac leads and defibrillator pads. Surgical clips in the right axillary region. Film is rotated. IMPRESSION: Underinflation with enlarged cardiac silhouette with vascular congestion. Defibrillator. Limited x-ray. Electronically Signed   By: Ranell Bring M.D.   On: 05/15/2024 19:43     .Critical Care  Performed by: Jerrol Agent, MD Authorized by: Jerrol Agent, MD   Critical care provider statement:    Critical care time (minutes):  45   Critical care was necessary to treat or prevent imminent or life-threatening deterioration of the following conditions:  Cardiac failure   Critical care was time spent personally by me on the following activities:  Development of treatment plan with patient or surrogate, discussions with consultants, evaluation of patient's response to treatment, examination of patient, ordering and review of laboratory studies, ordering and review of radiographic studies, ordering and performing treatments and interventions, pulse oximetry, re-evaluation of patient's condition and review of old charts   Care discussed with: admitting provider      Medications Ordered in the ED  amiodarone  (NEXTERONE  PREMIX) 360-4.14 MG/200ML-% (1.8 mg/mL) IV infusion (60 mg/hr Intravenous New Bag/Given 05/15/24 1942)    Followed by  amiodarone  (NEXTERONE  PREMIX) 360-4.14 MG/200ML-% (1.8 mg/mL) IV infusion (has no administration in time range)  furosemide  (LASIX ) injection 40 mg (has no administration in time range)  sodium chloride  flush (NS) 0.9 % injection 3 mL (has no administration in time range)  sodium chloride  flush (NS)  0.9 % injection 3 mL (has no administration in time range)  0.9 %  sodium chloride  infusion (has no administration in time range)  acetaminophen  (TYLENOL ) tablet 650 mg (has no administration in time range)  allopurinol  (ZYLOPRIM ) tablet 300 mg (has no administration in time range)  loratadine  (CLARITIN ) tablet  10 mg (has no administration in time range)  cyanocobalamin  (VITAMIN B12) tablet 1,000 mcg (has no administration in time range)  famotidine  (PEPCID ) tablet 20 mg (has no administration in time range)  levothyroxine  (SYNTHROID ) tablet 25 mcg (has no administration in time range)  latanoprost  (XALATAN ) 0.005 % ophthalmic solution 1 drop (has no administration in time range)  montelukast  (SINGULAIR ) tablet 10 mg (has no administration in time range)  pantoprazole  (PROTONIX ) EC tablet 40 mg (has no administration in time range)  PARoxetine  (PAXIL ) tablet 30 mg (has no administration in time range)  polyethylene glycol 0.4% and propylene glycol 0.3% (SYSTANE) ophthalmic gel (has no administration in time range)  sodium fluoride  (PREVIDENT 5000 PLUS) 1.1 % dental cream 1 Application (has no administration in time range)  amiodarone  (NEXTERONE ) 1.8 mg/mL load via infusion 150 mg (150 mg Intravenous Bolus from Bag 05/15/24 1942)  magnesium  sulfate IVPB 2 g 50 mL (0 g Intravenous Stopped 05/15/24 2113)                                    Medical Decision Making Amount and/or Complexity of Data Reviewed Labs: ordered. Radiology: ordered.  Risk Prescription drug management. Decision regarding hospitalization.    73 y.o. female with chronic HFrEF due to NICM (normal cors 2011), StJude BIV-ICD, SVT, PAF, VT/VF, breast CA s/p lumpectomy/XRT/chemotherapy, HTN, Type B aortic dissection (2010) managed with medical therapy scented to the Emergency Department with shortness of breath.  The patient was just at the A-fib clinic earlier today.  She felt fine and then after her clinic visit started to  develop shortness of breath.  EMS found her to be in a wide-complex rhythm.  On arrival, the patient appeared to be in ventricular tachycardia which self converted without an immediate intervention.  On repeat assessment, the patient denies any symptoms at this time.  On arrival, the patient was afebrile, initially tachycardic with a wide-complex tachycardia noted on cardiac telemetry and on EKG, rates in the 120s, BP 148/131, saturating 96% on room air, not tachypneic RR 19.  Patient initially appeared ashen gray, converted spontaneously to sinus rhythm with improvement.  Patient loaded with IV amiodarone , EMS rhythm strip looked more like torsades de points, administered 2 g IV magnesium .  Hemodynamically stable, cardiology consulted.  CXR: IMPRESSION:  Underinflation with enlarged cardiac silhouette with vascular  congestion. Defibrillator. Limited x-ray.   Labs: Initial cardiac troponin 102, magnesium  normal at 2.3, TSH normal, BMP with hyperkalemia to 5.6, hyponatremia to 127, AKI with a creatinine of 2.88, CBC without a leukocytosis or anemia, repeat cardiac troponin pending, lactic acid pending.  Cardiology consulted, will be admitted to the cardiology service for observation. Interrogation of her ICD ordered.  Patient will be admitted to the ICU under cardiology for close monitoring.     Final diagnoses:  AKI (acute kidney injury) (HCC)  Elevated troponin  Ventricular tachycardia [facility])    ED Discharge Orders     None          Jerrol Agent, MD 05/15/24 7945    Jerrol Agent, MD 05/15/24 2120

## 2024-05-15 NOTE — ED Notes (Signed)
 Family reports that the tremors and the shaking are worse at night and the patient has a hard time sleeping because of the jerking and startling, to the point that she will pull her mask off in her sleep. Family is trying to get her to her PCP but it is a difficult process for her to get to appointments leading to delays in care. Providers made aware.

## 2024-05-16 ENCOUNTER — Inpatient Hospital Stay (HOSPITAL_COMMUNITY): Admitting: Certified Registered Nurse Anesthetist

## 2024-05-16 ENCOUNTER — Encounter (HOSPITAL_COMMUNITY): Payer: Self-pay | Admitting: Cardiology

## 2024-05-16 ENCOUNTER — Other Ambulatory Visit: Payer: Self-pay | Admitting: *Deleted

## 2024-05-16 ENCOUNTER — Other Ambulatory Visit: Payer: Self-pay

## 2024-05-16 ENCOUNTER — Inpatient Hospital Stay (HOSPITAL_COMMUNITY)

## 2024-05-16 DIAGNOSIS — G4733 Obstructive sleep apnea (adult) (pediatric): Secondary | ICD-10-CM | POA: Diagnosis not present

## 2024-05-16 DIAGNOSIS — E875 Hyperkalemia: Secondary | ICD-10-CM

## 2024-05-16 DIAGNOSIS — Z79899 Other long term (current) drug therapy: Secondary | ICD-10-CM

## 2024-05-16 DIAGNOSIS — J9601 Acute respiratory failure with hypoxia: Secondary | ICD-10-CM

## 2024-05-16 DIAGNOSIS — R570 Cardiogenic shock: Secondary | ICD-10-CM | POA: Diagnosis not present

## 2024-05-16 DIAGNOSIS — I469 Cardiac arrest, cause unspecified: Secondary | ICD-10-CM

## 2024-05-16 DIAGNOSIS — I472 Ventricular tachycardia, unspecified: Secondary | ICD-10-CM | POA: Diagnosis not present

## 2024-05-16 DIAGNOSIS — N179 Acute kidney failure, unspecified: Secondary | ICD-10-CM

## 2024-05-16 DIAGNOSIS — G9341 Metabolic encephalopathy: Secondary | ICD-10-CM

## 2024-05-16 DIAGNOSIS — E871 Hypo-osmolality and hyponatremia: Secondary | ICD-10-CM

## 2024-05-16 LAB — BASIC METABOLIC PANEL WITH GFR
Anion gap: 15 (ref 5–15)
Anion gap: 14 (ref 5–15)
BUN: 58 mg/dL — ABNORMAL HIGH (ref 8–23)
BUN: 51 mg/dL — ABNORMAL HIGH (ref 8–23)
CO2: 24 mmol/L (ref 22–32)
CO2: 23 mmol/L (ref 22–32)
Calcium: 9 mg/dL (ref 8.9–10.3)
Calcium: 7.9 mg/dL — ABNORMAL LOW (ref 8.9–10.3)
Chloride: 89 mmol/L — ABNORMAL LOW (ref 98–111)
Chloride: 88 mmol/L — ABNORMAL LOW (ref 98–111)
Creatinine, Ser: 2.69 mg/dL — ABNORMAL HIGH (ref 0.44–1.00)
Creatinine, Ser: 2.08 mg/dL — ABNORMAL HIGH (ref 0.44–1.00)
GFR, Estimated: 18 mL/min — ABNORMAL LOW (ref >60)
GFR, Estimated: 25 mL/min — ABNORMAL LOW (ref >60)
Glucose, Bld: 96 mg/dL (ref 70–99)
Glucose, Bld: 139 mg/dL — ABNORMAL HIGH (ref 70–99)
Potassium: 5 mmol/L (ref 3.5–5.1)
Potassium: 3.7 mmol/L (ref 3.5–5.1)
Sodium: 128 mmol/L — ABNORMAL LOW (ref 135–145)
Sodium: 125 mmol/L — ABNORMAL LOW (ref 135–145)

## 2024-05-16 LAB — COMPREHENSIVE METABOLIC PANEL WITH GFR
ALT: 1756 U/L — ABNORMAL HIGH (ref 0–44)
ALT: 1584 U/L — ABNORMAL HIGH (ref 0–44)
ALT: 1910 U/L — ABNORMAL HIGH (ref 0–44)
AST: 1930 U/L — ABNORMAL HIGH (ref 15–41)
AST: 1634 U/L — ABNORMAL HIGH (ref 15–41)
AST: 1822 U/L — ABNORMAL HIGH (ref 15–41)
Albumin: 2.8 g/dL — ABNORMAL LOW (ref 3.5–5.0)
Albumin: 2.6 g/dL — ABNORMAL LOW (ref 3.5–5.0)
Albumin: 2.6 g/dL — ABNORMAL LOW (ref 3.5–5.0)
Alkaline Phosphatase: 138 U/L — ABNORMAL HIGH (ref 38–126)
Alkaline Phosphatase: 133 U/L — ABNORMAL HIGH (ref 38–126)
Alkaline Phosphatase: 152 U/L — ABNORMAL HIGH (ref 38–126)
Anion gap: 14 (ref 5–15)
Anion gap: 12 (ref 5–15)
Anion gap: 14 (ref 5–15)
BUN: 56 mg/dL — ABNORMAL HIGH (ref 8–23)
BUN: 53 mg/dL — ABNORMAL HIGH (ref 8–23)
BUN: 52 mg/dL — ABNORMAL HIGH (ref 8–23)
CO2: 25 mmol/L (ref 22–32)
CO2: 25 mmol/L (ref 22–32)
CO2: 23 mmol/L (ref 22–32)
Calcium: 8.8 mg/dL — ABNORMAL LOW (ref 8.9–10.3)
Calcium: 8.1 mg/dL — ABNORMAL LOW (ref 8.9–10.3)
Calcium: 7.9 mg/dL — ABNORMAL LOW (ref 8.9–10.3)
Chloride: 88 mmol/L — ABNORMAL LOW (ref 98–111)
Chloride: 90 mmol/L — ABNORMAL LOW (ref 98–111)
Chloride: 89 mmol/L — ABNORMAL LOW (ref 98–111)
Creatinine, Ser: 2.54 mg/dL — ABNORMAL HIGH (ref 0.44–1.00)
Creatinine, Ser: 2.39 mg/dL — ABNORMAL HIGH (ref 0.44–1.00)
Creatinine, Ser: 2.27 mg/dL — ABNORMAL HIGH (ref 0.44–1.00)
GFR, Estimated: 19 mL/min — ABNORMAL LOW (ref >60)
GFR, Estimated: 21 mL/min — ABNORMAL LOW (ref >60)
GFR, Estimated: 22 mL/min — ABNORMAL LOW (ref >60)
Glucose, Bld: 105 mg/dL — ABNORMAL HIGH (ref 70–99)
Glucose, Bld: 148 mg/dL — ABNORMAL HIGH (ref 70–99)
Glucose, Bld: 136 mg/dL — ABNORMAL HIGH (ref 70–99)
Potassium: 4.4 mmol/L (ref 3.5–5.1)
Potassium: 4.2 mmol/L (ref 3.5–5.1)
Potassium: 3.8 mmol/L (ref 3.5–5.1)
Sodium: 127 mmol/L — ABNORMAL LOW (ref 135–145)
Sodium: 127 mmol/L — ABNORMAL LOW (ref 135–145)
Sodium: 126 mmol/L — ABNORMAL LOW (ref 135–145)
Total Bilirubin: 1.5 mg/dL — ABNORMAL HIGH (ref 0.0–1.2)
Total Bilirubin: 1.2 mg/dL (ref 0.0–1.2)
Total Bilirubin: 1.4 mg/dL — ABNORMAL HIGH (ref 0.0–1.2)
Total Protein: 6.3 g/dL — ABNORMAL LOW (ref 6.5–8.1)
Total Protein: 6 g/dL — ABNORMAL LOW (ref 6.5–8.1)
Total Protein: 6.3 g/dL — ABNORMAL LOW (ref 6.5–8.1)

## 2024-05-16 LAB — POCT I-STAT 7, (LYTES, BLD GAS, ICA,H+H)
Acid-Base Excess: 1 mmol/L (ref 0.0–2.0)
Acid-Base Excess: 1 mmol/L (ref 0.0–2.0)
Bicarbonate: 27.7 mmol/L (ref 20.0–28.0)
Bicarbonate: 27.3 mmol/L (ref 20.0–28.0)
Calcium, Ion: 1.1 mmol/L — ABNORMAL LOW (ref 1.15–1.40)
Calcium, Ion: 1 mmol/L — ABNORMAL LOW (ref 1.15–1.40)
HCT: 37 % (ref 36.0–46.0)
HCT: 38 % (ref 36.0–46.0)
Hemoglobin: 12.6 g/dL (ref 12.0–15.0)
Hemoglobin: 12.9 g/dL (ref 12.0–15.0)
O2 Saturation: 99 %
O2 Saturation: 98 %
Patient temperature: 96.8
Patient temperature: 96.4
Potassium: 5.1 mmol/L (ref 3.5–5.1)
Potassium: 6.8 mmol/L (ref 3.5–5.1)
Sodium: 124 mmol/L — ABNORMAL LOW (ref 135–145)
Sodium: 122 mmol/L — ABNORMAL LOW (ref 135–145)
TCO2: 29 mmol/L (ref 22–32)
TCO2: 29 mmol/L (ref 22–32)
pCO2 arterial: 51.4 mmHg — ABNORMAL HIGH (ref 32–48)
pCO2 arterial: 46 mmHg (ref 32–48)
pH, Arterial: 7.334 — ABNORMAL LOW (ref 7.35–7.45)
pH, Arterial: 7.376 (ref 7.35–7.45)
pO2, Arterial: 122 mmHg — ABNORMAL HIGH (ref 83–108)
pO2, Arterial: 96 mmHg (ref 83–108)

## 2024-05-16 LAB — COOXEMETRY PANEL
Carboxyhemoglobin: 1.2 % (ref 0.5–1.5)
Carboxyhemoglobin: 0.8 % (ref 0.5–1.5)
Carboxyhemoglobin: 1 % (ref 0.5–1.5)
Methemoglobin: <0.7 % (ref 0.0–1.5)
Methemoglobin: <0.7 % (ref 0.0–1.5)
Methemoglobin: <0.7 % (ref 0.0–1.5)
O2 Saturation: 50.1 %
O2 Saturation: 48.1 %
O2 Saturation: 49.9 %
Total hemoglobin: 11.9 g/dL — ABNORMAL LOW (ref 12.0–16.0)
Total hemoglobin: 12.3 g/dL (ref 12.0–16.0)
Total hemoglobin: 12.2 g/dL (ref 12.0–16.0)

## 2024-05-16 LAB — CBC
HCT: 35.1 % — ABNORMAL LOW (ref 36.0–46.0)
Hemoglobin: 11.7 g/dL — ABNORMAL LOW (ref 12.0–15.0)
MCH: 30.4 pg (ref 26.0–34.0)
MCHC: 33.3 g/dL (ref 30.0–36.0)
MCV: 91.2 fL (ref 80.0–100.0)
Platelets: 134 K/uL — ABNORMAL LOW (ref 150–400)
RBC: 3.85 MIL/uL — ABNORMAL LOW (ref 3.87–5.11)
RDW: 15.5 % (ref 11.5–15.5)
WBC: 9 K/uL (ref 4.0–10.5)
nRBC: 0.8 % — ABNORMAL HIGH (ref 0.0–0.2)

## 2024-05-16 LAB — POCT I-STAT EG7
Acid-Base Excess: 0 mmol/L (ref 0.0–2.0)
Bicarbonate: 27.7 mmol/L (ref 20.0–28.0)
Calcium, Ion: 1.13 mmol/L — ABNORMAL LOW (ref 1.15–1.40)
HCT: 38 % (ref 36.0–46.0)
Hemoglobin: 12.9 g/dL (ref 12.0–15.0)
O2 Saturation: 38 %
Patient temperature: 96.8
Potassium: 4.5 mmol/L (ref 3.5–5.1)
Sodium: 126 mmol/L — ABNORMAL LOW (ref 135–145)
TCO2: 29 mmol/L (ref 22–32)
pCO2, Ven: 57.8 mmHg (ref 44–60)
pH, Ven: 7.283 (ref 7.25–7.43)
pO2, Ven: 24 mmHg — CL (ref 32–45)

## 2024-05-16 LAB — PHOSPHORUS
Phosphorus: 5.2 mg/dL — ABNORMAL HIGH (ref 2.5–4.6)
Phosphorus: 4.8 mg/dL — ABNORMAL HIGH (ref 2.5–4.6)

## 2024-05-16 LAB — HEPARIN LEVEL (UNFRACTIONATED): Heparin Unfractionated: >1.1 [IU]/mL — ABNORMAL HIGH (ref 0.30–0.70)

## 2024-05-16 LAB — APTT: aPTT: 64 s — ABNORMAL HIGH (ref 24–36)

## 2024-05-16 LAB — POTASSIUM: Potassium: 4.3 mmol/L (ref 3.5–5.1)

## 2024-05-16 LAB — MRSA NEXT GEN BY PCR, NASAL: MRSA by PCR Next Gen: NOT DETECTED

## 2024-05-16 LAB — MAGNESIUM
Magnesium: 2.7 mg/dL — ABNORMAL HIGH (ref 1.7–2.4)
Magnesium: 2.3 mg/dL (ref 1.7–2.4)

## 2024-05-16 LAB — LACTIC ACID, PLASMA
Lactic Acid, Venous: 1.2 mmol/L (ref 0.5–1.9)
Lactic Acid, Venous: 2.4 mmol/L (ref 0.5–1.9)

## 2024-05-16 LAB — GLUCOSE, CAPILLARY: Glucose-Capillary: 157 mg/dL — ABNORMAL HIGH (ref 70–99)

## 2024-05-16 MED ORDER — FENTANYL CITRATE PF 50 MCG/ML IJ SOSY
25.0000 ug | PREFILLED_SYRINGE | Freq: Once | INTRAMUSCULAR | Status: AC
  Administered 2024-05-16: 50 ug via INTRAVENOUS
  Filled 2024-05-16: qty 1

## 2024-05-16 MED ORDER — NOREPINEPHRINE 4 MG/250ML-% IV SOLN: 0.0000 ug/min | INTRAVENOUS | Status: DC

## 2024-05-16 MED ORDER — ETOMIDATE 2 MG/ML IV SOLN
INTRAVENOUS | Status: DC | PRN
  Administered 2024-05-16: 20 mg via INTRAVENOUS

## 2024-05-16 MED ORDER — FENTANYL CITRATE PF 50 MCG/ML IJ SOSY
PREFILLED_SYRINGE | INTRAMUSCULAR | Status: AC | PRN
  Administered 2024-05-16: 50 ug via INTRAVENOUS

## 2024-05-16 MED ORDER — MIDAZOLAM-SODIUM CHLORIDE 100-0.9 MG/100ML-% IV SOLN
0.5000 mg/h | INTRAVENOUS | Status: DC
  Administered 2024-05-16: 1 mg/h via INTRAVENOUS
  Administered 2024-05-17: 3 mg/h via INTRAVENOUS
  Filled 2024-05-16 (×2): qty 100

## 2024-05-16 MED ORDER — AMIODARONE HCL IN DEXTROSE 360-4.14 MG/200ML-% IV SOLN
60.0000 mg/h | INTRAVENOUS | Status: DC
  Administered 2024-05-16 – 2024-05-17 (×6): 60 mg/h via INTRAVENOUS
  Filled 2024-05-16 (×5): qty 200

## 2024-05-16 MED ORDER — FENTANYL 2500MCG IN NS 250ML (10MCG/ML) PREMIX INFUSION
0.0000 ug/h | INTRAVENOUS | Status: DC
  Administered 2024-05-16: 50 ug/h via INTRAVENOUS
  Administered 2024-05-17: 100 ug/h via INTRAVENOUS
  Filled 2024-05-16 (×2): qty 250

## 2024-05-16 MED ORDER — LEVOTHYROXINE SODIUM 25 MCG PO TABS
25.0000 ug | ORAL_TABLET | Freq: Every day | ORAL | Status: DC
  Administered 2024-05-17: 25 ug
  Filled 2024-05-16: qty 1

## 2024-05-16 MED ORDER — PANTOPRAZOLE SODIUM 40 MG IV SOLR
40.0000 mg | INTRAVENOUS | Status: DC
  Administered 2024-05-16 – 2024-05-17 (×2): 40 mg via INTRAVENOUS
  Filled 2024-05-16 (×2): qty 10

## 2024-05-16 MED ORDER — ORAL CARE MOUTH RINSE
15.0000 mL | OROMUCOSAL | Status: DC
  Administered 2024-05-16 (×3): 15 mL via OROMUCOSAL

## 2024-05-16 MED ORDER — ORAL CARE MOUTH RINSE
15.0000 mL | OROMUCOSAL | Status: DC | PRN
Start: 2024-05-16 — End: 2024-05-16

## 2024-05-16 MED ORDER — PHENYLEPHRINE 80 MCG/ML (10ML) SYRINGE FOR IV PUSH (FOR BLOOD PRESSURE SUPPORT): 80.0000 ug | PREFILLED_SYRINGE | Freq: Once | INTRAVENOUS | Status: DC | PRN

## 2024-05-16 MED ORDER — THIAMINE HCL 100 MG/ML IJ SOLN
100.0000 mg | Freq: Every day | INTRAMUSCULAR | Status: DC
  Administered 2024-05-16 – 2024-05-17 (×2): 100 mg via INTRAVENOUS
  Filled 2024-05-16 (×2): qty 2

## 2024-05-16 MED ORDER — FUROSEMIDE 10 MG/ML IJ SOLN
10.0000 mg/h | INTRAVENOUS | Status: DC
  Administered 2024-05-16 – 2024-05-17 (×4): 10 mg/h via INTRAVENOUS
  Filled 2024-05-16 (×3): qty 20

## 2024-05-16 MED ORDER — VASOPRESSIN 20 UNITS/100 ML INFUSION FOR SHOCK
0.0000 [IU]/min | INTRAVENOUS | Status: DC
  Administered 2024-05-16 – 2024-05-17 (×4): 0.04 [IU]/min via INTRAVENOUS
  Filled 2024-05-16 (×4): qty 100

## 2024-05-16 MED ORDER — PHENYLEPHRINE 80 MCG/ML (10ML) SYRINGE FOR IV PUSH (FOR BLOOD PRESSURE SUPPORT)
PREFILLED_SYRINGE | INTRAVENOUS | Status: AC | PRN
  Administered 2024-05-16: 160 ug via INTRAVENOUS
  Administered 2024-05-16: 80 ug via INTRAVENOUS
  Administered 2024-05-16 (×3): 160 ug via INTRAVENOUS

## 2024-05-16 MED ORDER — MIDAZOLAM HCL 2 MG/2ML IJ SOLN
INTRAMUSCULAR | Status: AC
  Administered 2024-05-16: 4 mg
  Filled 2024-05-16: qty 4

## 2024-05-16 MED ORDER — PHENYLEPHRINE 80 MCG/ML (10ML) SYRINGE FOR IV PUSH (FOR BLOOD PRESSURE SUPPORT)
80.0000 ug | PREFILLED_SYRINGE | Freq: Once | INTRAVENOUS | Status: DC | PRN
  Filled 2024-05-16: qty 10

## 2024-05-16 MED ORDER — FENTANYL CITRATE PF 50 MCG/ML IJ SOSY
50.0000 ug | PREFILLED_SYRINGE | Freq: Once | INTRAMUSCULAR | Status: AC
  Administered 2024-05-16: 50 ug via INTRAVENOUS

## 2024-05-16 MED ORDER — SUCCINYLCHOLINE CHLORIDE 200 MG/10ML IV SOSY
PREFILLED_SYRINGE | INTRAVENOUS | Status: DC | PRN
  Administered 2024-05-16: 200 mg via INTRAVENOUS

## 2024-05-16 MED ORDER — ORAL CARE MOUTH RINSE
15.0000 mL | OROMUCOSAL | Status: DC
  Administered 2024-05-16 – 2024-05-17 (×14): 15 mL via OROMUCOSAL

## 2024-05-16 MED ORDER — ETOMIDATE 2 MG/ML IV SOLN
INTRAVENOUS | Status: AC | PRN
  Administered 2024-05-16: 20 mg via INTRAVENOUS

## 2024-05-16 MED ORDER — LIDOCAINE HCL (CARDIAC) PF 100 MG/5ML IV SOSY
100.0000 mg | PREFILLED_SYRINGE | Freq: Once | INTRAVENOUS | Status: AC
  Administered 2024-05-16: 100 mg via INTRAVENOUS

## 2024-05-16 MED ORDER — MAGNESIUM SULFATE 2 GM/50ML IV SOLN
2.0000 g | Freq: Once | INTRAVENOUS | Status: AC
  Administered 2024-05-16: 2 g via INTRAVENOUS
  Filled 2024-05-16: qty 50

## 2024-05-16 MED ORDER — FENTANYL CITRATE PF 50 MCG/ML IJ SOSY
PREFILLED_SYRINGE | INTRAMUSCULAR | Status: AC
Start: 2024-05-16 — End: 2024-05-16
  Administered 2024-05-16: 50 ug via INTRAVENOUS
  Filled 2024-05-16: qty 1

## 2024-05-16 MED ORDER — LIDOCAINE IN D5W 4-5 MG/ML-% IV SOLN
1.0000 mg/min | INTRAVENOUS | Status: DC
  Administered 2024-05-16: 1 mg/min via INTRAVENOUS
  Administered 2024-05-16: 2 mg/min via INTRAVENOUS
  Filled 2024-05-16: qty 500

## 2024-05-16 MED ORDER — NOREPINEPHRINE 16 MG/250ML-% IV SOLN
0.0000 ug/min | INTRAVENOUS | Status: DC
  Administered 2024-05-16: 20 ug/min via INTRAVENOUS
  Administered 2024-05-16: 17 ug/min via INTRAVENOUS
  Administered 2024-05-17: 22 ug/min via INTRAVENOUS
  Filled 2024-05-16 (×3): qty 250

## 2024-05-16 MED ORDER — ORAL CARE MOUTH RINSE
15.0000 mL | OROMUCOSAL | Status: DC | PRN
Start: 2024-05-16 — End: 2024-05-18

## 2024-05-16 MED ORDER — NOREPINEPHRINE 4 MG/250ML-% IV SOLN
INTRAVENOUS | Status: AC
  Administered 2024-05-16: 10 ug/min via INTRAVENOUS
  Filled 2024-05-16: qty 250

## 2024-05-16 MED ORDER — FENTANYL BOLUS VIA INFUSION
25.0000 ug | INTRAVENOUS | Status: DC | PRN
  Administered 2024-05-17 (×2): 25 ug via INTRAVENOUS

## 2024-05-16 MED ORDER — LIDOCAINE HCL (CARDIAC) PF 100 MG/5ML IV SOSY
PREFILLED_SYRINGE | INTRAVENOUS | Status: AC | PRN
  Administered 2024-05-16: 120.1 mg via INTRAVENOUS

## 2024-05-16 MED ORDER — LIDOCAINE IN D5W 4-5 MG/ML-% IV SOLN
INTRAVENOUS | Status: AC
  Filled 2024-05-16: qty 500

## 2024-05-16 MED ORDER — MIDAZOLAM BOLUS VIA INFUSION
4.0000 mg | Freq: Once | INTRAVENOUS | Status: AC
  Administered 2024-05-16: 4 mg via INTRAVENOUS
  Filled 2024-05-16: qty 4

## 2024-05-16 MED ORDER — EPINEPHRINE HCL 5 MG/250ML IV SOLN IN NS
0.0000 ug/min | INTRAVENOUS | Status: DC
  Administered 2024-05-16: 5 ug/min via INTRAVENOUS

## 2024-05-16 MED ORDER — AMIODARONE IV BOLUS ONLY 150 MG/100ML
150.0000 mg | Freq: Once | INTRAVENOUS | Status: AC
  Administered 2024-05-16: 150 mg via INTRAVENOUS

## 2024-05-16 MED ORDER — FENTANYL BOLUS VIA INFUSION
50.0000 ug | Freq: Once | INTRAVENOUS | Status: AC
  Administered 2024-05-16: 50 ug via INTRAVENOUS
  Filled 2024-05-16: qty 50

## 2024-05-16 MED ORDER — EPINEPHRINE HCL 5 MG/250ML IV SOLN IN NS
INTRAVENOUS | Status: AC
  Filled 2024-05-16: qty 250

## 2024-05-16 MED ORDER — VASOPRESSIN 20 UNITS/100 ML INFUSION FOR SHOCK
INTRAVENOUS | Status: AC
  Administered 2024-05-16: 0.04 [IU]/min via INTRAVENOUS
  Filled 2024-05-16: qty 100

## 2024-05-16 MED ORDER — LIDOCAINE IN D5W 4-5 MG/ML-% IV SOLN
INTRAVENOUS | Status: AC | PRN
  Administered 2024-05-16: 1 mg/min via INTRAVENOUS

## 2024-05-16 MED ORDER — FUROSEMIDE 10 MG/ML IJ SOLN
120.0000 mg | Freq: Once | INTRAVENOUS | Status: AC
  Administered 2024-05-16: 120 mg via INTRAVENOUS
  Filled 2024-05-16: qty 10

## 2024-05-16 MED ORDER — PHENYLEPHRINE 80 MCG/ML (10ML) SYRINGE FOR IV PUSH (FOR BLOOD PRESSURE SUPPORT)
PREFILLED_SYRINGE | INTRAVENOUS | Status: AC
  Filled 2024-05-16: qty 10

## 2024-05-16 MED ORDER — AMIODARONE HCL 150 MG/3ML IV SOLN
INTRAVENOUS | Status: AC | PRN
  Administered 2024-05-16: 150 mg via INTRAVENOUS

## 2024-05-16 NOTE — Significant Event (Signed)
 I was called to the bedside by nursing after the patient became pulseless in the setting of recurrent VT.  Compressions were briefly started and the patient underwent defibrillation before regaining consciousness.  While I was at bedside the patient repeatedly went back into a sustained monomorphic VT and intermittently lost consciousness.  She was defibrillated ~3 more times.  I proceeded to give lidocaine  100 mg IV bolus x 1 followed by a lidocaine  gtt.at 1 mg/min.  She was also given amiodarone  150 mg bolus.  Given that the patient was now in VT storm I called for a rapid intubation for sedation.  Following intubation the patient was markedly hypotensive requiring phenyl pushes x3.  I started Levophed  and ultimately epinephrine  infusions to maintain her MAPs.  She is currently on Levophed  at 20 mcg/min and epinephrine  10 mcg/min.  Started fentanyl  and Versed  for PAD.  I called both Dr. Zenaida and Dr. Cherrie to assess her candidacy for MCS.  Given her age and comorbidities, it is unlikely that she would be a candidate for advanced therapies; however, advanced heart failure will evaluate at the bedside.  Patient is currently intubated and sedated with MAPs in the 70s on maximal life support.  The patient's lovely daughter is at bedside and is currently calling family members to come to the hospital.  Dr. Georganna T. Floretta HEATH, MD [facility] Fellow

## 2024-05-16 NOTE — Progress Notes (Signed)
 Responded to [employer]. On our arrival, code was already being run by cardiology fellow and ACLS was taking place.

## 2024-05-16 NOTE — Progress Notes (Signed)
 eLink Physician-Brief Progress Note Patient Name: [name] DOB: 05/06/1951 MRN: [id number]   Date of Service  05/16/2024  HPI/Events of Note  73 y.o. female with chronic HFrEF due to NICM (normal cors 2011), StJude BIV-ICD, SVT, PAF, VT/VF, breast CA s/p lumpectomy/XRT/ chemotherapy, HTN, Type B aortic dissection who presented for management of progressive VT storm.  Cardiac arrest was called after the patient had 2 bouts of pulselessness associated with VT.  The patient was shocked out of each episode and maintain neurological function in between.  Episodes of pulselessness were brief and CPR was performed in the interim.  Additional amiodarone  was infused, lidocaine  bolus was infused.  Cardiology promptly responded and assumed care.  Given recurrent episodes, anesthesia intubated the patient with 7.5 mm ET tube.  Vital signs now show severe shock and hypoxemia.  She is saturating 92% on 50% FiO2 on the vent.  Currently running continuous amiodarone , Lasix , norepinephrine , epinephrine , vasopressin .   Pre-ROSC labs show elevated creatinine, severe transaminitis, resolving lactic acidosis repeat chest radiograph pending  eICU Interventions  VT storm management per cardiology-May need to hold amiodarone  in the setting of shock liver.  Trend labs every 12 hours  Co. oximetry, arterial line with ABG  Shock management with norepinephrine , epinephrine   Daily spontaneous awakening trial/breathing trial.  Maintain Versed /fentanyl  infusions  DVT prophylaxis with therapeutic heparin  GI prophylaxis with therapeutic pantoprazole      Intervention Category Evaluation Type: New Patient Evaluation  Odel Schmid 05/16/2024, 5:11 AM

## 2024-05-16 NOTE — Progress Notes (Signed)
 At [id number] patient went into a slow vtach. RN immediately paged Dr. Floretta to bedside.  At [phone] patient lost pulse, CPR was imitated (see code documentation & MAR).   Patient was shocked a total of 4 times and intubated at 0459.   Dr. Floretta was present in the room and gave verbal orders on titration of pressors and sedation.

## 2024-05-16 NOTE — Procedures (Signed)
 Arterial Catheter Insertion Procedure Note  [name]  [id number]  25-Feb-1951  Date:05/16/24  Time:5:31 AM    Provider Performing: Deward LELON Eastern    Procedure: Insertion of Arterial Line (63379) with US  guidance (23062)   Indication(s) Blood pressure monitoring and/or need for frequent ABGs  Consent Unable to obtain consent due to emergent nature of procedure.  Anesthesia None   Time Out Verified patient identification, verified procedure, site/side was marked, verified correct patient position, special equipment/implants available, medications/allergies/relevant history reviewed, required imaging and test results available.   Sterile Technique Maximal sterile technique including full sterile barrier drape, hand hygiene, sterile gown, sterile gloves, mask, hair covering, sterile ultrasound probe cover (if used).   Procedure Description Area of catheter insertion was cleaned with chlorhexidine  and draped in sterile fashion. With real-time ultrasound guidance an arterial catheter was placed into the left radial artery.  Appropriate arterial tracings confirmed on monitor.     Complications/Tolerance None; patient tolerated the procedure well.   EBL Minimal   Specimen(s) None   Deward Eastern, AGACNP-BC [facility]  See Amion for personal pager PCCM on call pager [phone] until 7pm. Please call Elink 7p-7a. [phone]  05/16/2024 5:31 AM

## 2024-05-16 NOTE — Progress Notes (Signed)
 Heart Failure Navigator Progress Note  Assessed for Heart & Vascular TOC clinic readiness.  Patient does not meet criteria due to Advanced Heart Failure Team patient of Dr. Gala Romney. .   Navigator will sign off at this time.   Rhae Hammock, BSN, Scientist, clinical (histocompatibility and immunogenetics) Only

## 2024-05-16 NOTE — Procedures (Signed)
 Arterial Catheter Insertion Procedure Note  REATHA SUR  [id number]  1951-02-21  Date:05/16/24  Time:5:41 PM    Provider Performing: Tinnie FORBES Furth    Procedure: Insertion of Arterial Line (63379) with US  guidance (23062)   Indication(s) Blood pressure monitoring and/or need for frequent ABGs  Consent Unable to obtain consent due to emergent nature of procedure.  Anesthesia None   Time Out Verified patient identification, verified procedure, site/side was marked, verified correct patient position, special equipment/implants available, medications/allergies/relevant history reviewed, required imaging and test results available.   Sterile Technique Maximal sterile technique including full sterile barrier drape, hand hygiene, sterile gown, sterile gloves, mask, hair covering, sterile ultrasound probe cover (if used).   Procedure Description Area of catheter insertion was cleaned with chlorhexidine  and draped in sterile fashion. With real-time ultrasound guidance an arterial catheter was placed into the left axillary artery.  Appropriate arterial tracings confirmed on monitor.     Complications/Tolerance None; patient tolerated the procedure well.   EBL Minimal   Specimen(s) None   Tinnie FORBES Furth, PA-C [facility] 05/16/24 5:41 PM  Please see [email or web address] for pager details.  From 7A-7P if no response, please call [phone] After hours, please call ELink [phone]

## 2024-05-16 NOTE — Consult Note (Addendum)
 Advanced Heart Failure Team Consult Note   Primary Physician: Thurmond Cathlyn LABOR., MD Cardiologist:  Dr. Bensimhon   Reason for Consultation: VT Storm  HPI:    [name] is seen today for evaluation of VT storm at the request of Dr. Lavona, cardiology.   73 y.o. woman with a history of HTN, Type B aortic dissection (2010) managed with medical therapy by Dr. Fleeta Ochoa and has healed, LBBB, systolic heart failure due to NICM with EF 10-15% Cath Dec 2011 showed normal arteries.     She was diagnosed with breast cancer in 2009 s/p lumpectomy, XRT, and chemotherapy.  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   She underwent St Jude Bi-V ICD implantation Sept 2012 but unable to place LV lead.  She eventually brought back in Nov 2012 for left thoracotomy and placement of epicardial LV lead placement by Dr. Fleeta Ochoa.  She also had a sleep study that showed mild OSA.  Echo 11/23 EF 20-25% RV ok    Zio 2/25 4 days 3 SVT (longest 2 min 46 sec) No AF.    Sleep study severe OSA AHI 71, OSA 33 CSA    Admitted 6/25 for pre-syncope, felt to be orthostatic hypotension,  no arrhythmias noted on device interrogation. Admitted 04/10/24 with multiple shocks, interrogation showed AF RVR that devolved into VT/VF, for which she received 3 shocks Loaded with IV amio, later transitioned to po and discharged home. Re-admitted 3 days later with ICD shocks and LOC. Felt to have rapid AF again that devolved into VT. She was loaded again with IV amio. Echo showed EF < 20%, moderate MR Device settings adjusted, discussed possibility of AVJ ablation. Amio transitioned to PO  Admitted now w/ VT storm and CS. Had pulseless VT overnight, requiring brief chest compressions and multiple defibrillations. Intubated. On amio and lidocaine  gtts. Initial Mg 2.3, K 5.6. Hs trop 100>>102. LA 2.4 this morning.  SCr 2.4, AST/ALT 1600. On multiple inotropic and  pressor support, Epi 5, NE 20, VP 0.04   Despite amio and lidocaine  gtts, she continues to have runs of VT. Family at bedside and considering transition to comfort care.     Home Medications Prior to Admission medications   Medication Sig Start Date End Date Taking? Authorizing Provider  acetaminophen  (TYLENOL ) 500 MG tablet Take 1,000 mg by mouth every 6 (six) hours as needed for mild pain (pain score 1-3), moderate pain (pain score 4-6) or headache.   Yes [provider]  allopurinol  (ZYLOPRIM ) 300 MG tablet Take 300 mg by mouth daily.   Yes [provider]  amiodarone  (PACERONE ) 200 MG tablet Take 1 tablet by mouth daily 05/15/24  Yes Terra Fairy PARAS, PA-C  apixaban  (ELIQUIS ) 5 MG TABS tablet Take 1 tablet (5 mg total) by mouth 2 (two) times daily. 04/05/24  Yes Terra Fairy PARAS, PA-C  Calcium  Carbonate-Vitamin D (CALCIUM -VITAMIN D3) 600-200 MG-UNIT TABS Take 1 tablet by mouth 2 (two) times daily.   Yes [provider]  cetirizine (ZYRTEC) 10 MG tablet Take 10 mg by mouth at bedtime. 04/08/19  Yes [provider]  Cholecalciferol (VITAMIN D) 125 MCG (5000 UT) CAPS Take 5,000 Units by mouth daily.   Yes [provider]  colchicine  0.6 MG tablet Take 0.6 mg by mouth daily as needed (Gout flare up).   Yes [provider]  cyanocobalamin  (VITAMIN B12) 1000 MCG tablet Take 1,000 mcg  by mouth daily.   Yes [provider]  cyclobenzaprine  (FLEXERIL ) 10 MG tablet Take 10 mg by mouth 3 (three) times daily as needed for muscle spasms.   Yes [provider]  diclofenac  sodium (VOLTAREN ) 1 % GEL APPLY THREE GRAMS TO THREE LARGE JOINTS UP TO THREE TIMES DAILY AS NEEDED Patient taking differently: Apply 3 g topically 3 (three) times daily as needed (for joints). 04/04/18  Yes Cheryl Waddell HERO, PA-C  famotidine  (PEPCID ) 20 MG tablet Take 20 mg by mouth 2 (two) times daily.   Yes [provider]  furosemide  (LASIX ) 40 MG tablet Take 1  tablet (40 mg total) by mouth daily. 05/01/24  Yes Clegg, Amy D, NP  levothyroxine  (SYNTHROID ) 25 MCG tablet Take 25 mcg by mouth daily. 01/15/20  Yes [provider]  losartan  (COZAAR ) 25 MG tablet Take 0.5 tablets (12.5 mg total) by mouth at bedtime. 05/08/24 08/06/24 Yes Milford, Harlene HERO, FNP  LUMIGAN  0.01 % SOLN Place 1 drop into both eyes at bedtime. 04/30/24  Yes [provider]  metoprolol  succinate (TOPROL -XL) 25 MG 24 hr tablet Take 0.5 tablets (12.5 mg total) by mouth 2 (two) times daily. 05/07/24  Yes Ollis, Brandi L, NP  montelukast  (SINGULAIR ) 10 MG tablet Take 10 mg by mouth at bedtime. For allergies & congestion   Yes [provider]  Multiple Vitamins-Minerals (OCUVITE PO) Take 5 mg by mouth daily.   Yes [provider]  omeprazole  (PRILOSEC) 40 MG capsule TAKE ONE CAPSULE BY MOUTH TWICE DAILY 08/04/20  Yes Kozlow, Eric J, MD  ondansetron  (ZOFRAN ) 4 MG tablet Take 4 mg by mouth every 8 (eight) hours as needed for nausea. 05/01/24  Yes [provider]  PARoxetine  (PAXIL ) 30 MG tablet Take 30 mg by mouth at bedtime. 02/10/16  Yes [provider]  Polyethyl Glycol-Propyl Glycol (SYSTANE OP) Place 1 drop into both eyes daily as needed (dry eyes).   Yes [provider]  ranitidine (ZANTAC) 300 MG capsule Take 300 mg by mouth daily as needed. Acid indigestion Patient taking differently: Take 300 mg by mouth as needed. Acid indigestion 05/07/24 05/08/25 Yes Ollis, Brandi L, NP  rosuvastatin  (CRESTOR ) 5 MG tablet TAKE ONE TABLET BY MOUTH DAILY AT 8 PM 05/13/24  Yes Bensimhon, Toribio SAUNDERS, MD  sodium fluoride  (DENTA 5000 PLUS) 1.1 % CREA dental cream Place 1 Application onto teeth 2 (two) times daily.   Yes [provider]  spironolactone  (ALDACTONE ) 25 MG tablet Take 0.5 tablets (12.5 mg total) by mouth daily. 04/09/24  Yes Bensimhon, Toribio SAUNDERS, MD  traMADol  (ULTRAM ) 50 MG tablet Take 50 mg by mouth every 6 (six) hours as needed for  pain. Patient taking differently: Take 50 mg by mouth as needed for pain.   Yes [provider]  vitamin E 1000 UNIT capsule Take 1,000 Units by mouth daily.   Yes [provider]  nystatin  (MYCOSTATIN /NYSTOP ) powder Apply topically 2 (two) times daily. Patient not taking: Reported on 05/15/2024 04/08/24   Trixie Nilda HERO, MD    Past Medical History: Past Medical History:  Diagnosis Date   Aortic dissection (HCC) 12/2008   Type B   Arthritis    lower back   Atrial fibrillation (HCC)    post op (left thoracotomy) 11/12 req amio/DCCV   Breast cancer (HCC) 07/2009   Stage 3 lumpectomy, radiation, chemotherapy; finished chemo October, felt to be in remission   Cardiac defibrillator in place    St. Jude EULOGIO) 9/12;  LV lead placement unsuccessful;  s/p left thoracotomy with placement of epicardial LV lead 09/2011 (Dr. PVT)   CHF (congestive heart failure) (HCC)    Chronic systolic dysfunction of left ventricle    Depression    DVT (deep venous thrombosis) (HCC) 10/18/2010   GERD (gastroesophageal reflux disease)    Glaucoma    Hiatal hernia    small hiatal hernia   History of shingles    Hyperlipidemia    Hypertension    Left bundle branch block    Nonischemic cardiomyopathy (HCC)    Admission 12/11 with EF 25%, normal coronary arteries by cath 12/11; NICM presumed to be secondary to chemotherapy for breast cancer   Obesity    [name]    with exertion    Past Surgical History: Past Surgical History:  Procedure Laterality Date   BIV ICD GENERATOR CHANGEOUT N/A 05/22/2023   Procedure: BIV ICD GENERATOR CHANGEOUT;  Surgeon: Inocencio Soyla Lunger, MD;  Location: [facility] INVASIVE CV LAB;  Service: Cardiovascular;  Laterality: N/A;   BREAST LUMPECTOMY     Right breast   CARDIAC CATHETERIZATION     2011   CARDIAC DEFIBRILLATOR PLACEMENT     EP IMPLANTABLE DEVICE N/A 01/12/2016   STJ ICD Unify Assura gen change, Dr. Kelsie   OOPHORECTOMY     Single    THORACOTOMY  09/16/2011   Procedure: THORACOTOMY MAJOR;  Surgeon: Maude Fleeta Hanford DOUGLAS, MD;  Location: [facility] OR;  Service: Thoracic;  Laterality: Left;  left anterolateral Thoracotomy for placement of St. Jude epicardial pacing lead    TONSILLECTOMY     TUBAL LIGATION     Bilateral    Family History: Family History  Problem Relation Age of Onset   Hypertension Mother    Glaucoma Mother    Atrial fibrillation Mother    Hypertension Father    Hypertension Brother    Hypertension Maternal Grandfather    Heart attack Maternal Grandfather        MI   Coronary artery disease Maternal Grandfather    Hypertension Paternal Grandfather    Heart attack Paternal Grandfather        MI   Coronary artery disease Paternal Grandfather    Hypertension Brother     Social History: Social History   Socioeconomic History   Marital status: Married    Spouse name: Not on file   Number of children: Not on file   Years of education: Not on file   Highest education level: Not on file  Occupational History   Occupation: Radio producer: Social worker OF [place]   Occupation: Retired from Phot[employer]  Tobacco Use   Smoking status: Never   Smokeless tobacco: Never  Vaping Use   Vaping status: Never Used  Substance and Sexual Activity   Alcohol  use: No   Drug use: No   Sexual activity: Not on file  Other Topics Concern   Not on file  Social History Narrative   Married   One child   Lives in [place] with spouse and mother in law   Previously worked as a Public librarian for [employer]      She is a Social worker counsel member.   Social Drivers of Corporate investment banker Strain: Not on file  Food Insecurity: No Food Insecurity (05/16/2024)   Hunger Vital Sign    Worried About Running Out of Food in the Last Year: Never true    Ran Out of Food in the  Last Year: Never true  Transportation Needs: No Transportation Needs (05/16/2024)   PRAPARE - Administrator, Civil Service  (Medical): No    Lack of Transportation (Non-Medical): No  Physical Activity: Not on file  Stress: Not on file  Social Connections: Socially Integrated (05/16/2024)   Social Connection and Isolation Panel    Frequency of Communication with Friends and Family: More than three times a week    Frequency of Social Gatherings with Friends and Family: Once a week    Attends Religious Services: 1 to 4 times per year    Active Member of [employer] or Organizations: Yes    Attends Banker Meetings: 1 to 4 times per year    Marital Status: Married    Allergies:  Allergies  Allergen Reactions   Wound Dressing Adhesive Other (See Comments)    Blistering of the skin;    Alphagan [Brimonidine]     Irritation    Mobic [Meloxicam] Other (See Comments)    Cannot take due to heart issue   Nsaids Other (See Comments)    Cannot take due to heart issue   Polyvinyl Alcohol  Other (See Comments)    Travatan and Alphagan(irritation)   Statins Other (See Comments)    Per pt her cardiologist told her not to take   Tolmetin Other (See Comments)    Can not take due to heart issue   Travatan Z [Travoprost (Bak Free)]     Irritation   Tape Rash    tape Blister from clear tapes.    Objective:    Vital Signs:   Temp:  [97.7 F (36.5 C)-98 F (36.7 C)] 98 F (36.7 C) (07/17 0400) Pulse Rate:  [52-126] 105 (07/17 0701) Resp:  [14-25] 22 (07/17 0701) BP: (46-148)/(34-131) 97/77 (07/17 0606) SpO2:  [88 %-100 %] 96 % (07/17 0701) Arterial Line BP: (92-138)/(58-98) 106/69 (07/17 0701) FiO2 (%):  [50 %-60 %] 60 % (07/17 0619) Weight:  [120.1 kg] 120.1 kg (07/17 0357)    Weight change: Filed Weights   05/15/24 2102 05/16/24 0015 05/16/24 0357  Weight: 120.1 kg 120.1 kg 120.1 kg    Intake/Output:   Intake/Output Summary (Last 24 hours) at 05/16/2024 0718 Last data filed at 05/16/2024 0400 Gross per 24 hour  Intake 551.85 ml  Output 250 ml  Net 301.85 ml      Physical Exam     General:  critically ill, intubated and sedated  HEENT: + ETT  Neck: JVP elevated to jaw Cor: RRR Lungs: intubated and clear  Abdomen: soft, nontender, nondistended Extremities: 1+ b/l LEE  Neuro: intubated and sedated    Telemetry   Afib w/ CVR and multiple runs of VT overnight   Labs   Basic Metabolic Panel: Recent Labs  Lab 05/15/24 1906 05/15/24 2303 05/16/24 0320 05/16/24 0526 05/16/24 0527 05/16/24 0532  NA 127* 128* 127* 127* 126* 124*  K 5.6* 5.0 4.4 4.2 4.5 5.1  CL 89* 89* 88* 90*  --   --   CO2 22 24 25 25   --   --   GLUCOSE 94 96 105* 148*  --   --   BUN 56* 58* 56* 53*  --   --   CREATININE 2.88* 2.69* 2.54* 2.39*  --   --   CALCIUM  9.2 9.0 8.8* 8.1*  --   --   MG 2.3  --  2.7*  --   --   --   PHOS  --   --   --  5.2*  --   --     Liver Function Tests: Recent Labs  Lab 05/15/24 1906 05/16/24 0320 05/16/24 0526  AST 2,203* 1,930* 1,634*  ALT 1,906* 1,756* 1,584*  ALKPHOS 139* 138* 133*  BILITOT 1.8* 1.5* 1.2  PROT 6.6 6.3* 6.0*  ALBUMIN 2.8* 2.8* 2.6*   No results for input(s): LIPASE, AMYLASE in the last 168 hours. No results for input(s): AMMONIA in the last 168 hours.  CBC: Recent Labs  Lab 05/15/24 1906 05/16/24 0320 05/16/24 0527 05/16/24 0532  WBC 8.8 9.0  --   --   HGB 12.2 11.7* 12.9 12.6  HCT 36.8 35.1* 38.0 37.0  MCV 90.9 91.2  --   --   PLT 116* 134*  --   --     Cardiac Enzymes: No results for input(s): CKTOTAL, CKMB, CKMBINDEX, TROPONINI in the last 168 hours.  BNP: BNP (last 3 results) Recent Labs    04/06/24 0455 04/10/24 1336 05/15/24 2303  BNP 341.5* 314.1* 1,594.8*    ProBNP (last 3 results) No results for input(s): PROBNP in the last 8760 hours.   CBG: Recent Labs  Lab 05/15/24 2251  GLUCAP 94    Coagulation Studies: No results for input(s): LABPROT, INR in the last 72 hours.   Imaging   DG CHEST PORT 1 VIEW Addendum Date: 05/16/2024 ADDENDUM REPORT: 05/16/2024 06:36  ADDENDUM: Study discussed by telephone with Nurse Hadassah on 05/16/2024 at 0628 hours, and she advised the medical team had adjusted the ET tube back following this exam. Electronically Signed   By: VEAR Hurst M.D.   On: 05/16/2024 06:36   Result Date: 05/16/2024 CLINICAL DATA:  73 year old female with shortness of breath, possible cardiac shock. Intubated. EXAM: PORTABLE CHEST 1 VIEW COMPARISON:  Portable chest 0308 hours today. FINDINGS: Portable AP supine view at 0532 hours. Similarly rotated to the left. Endotracheal tube tip projects at the carina. Enteric tube courses to the left abdomen, tip not included. Right IJ central line placed, tip at the right atrium level, stable from earlier. Left chest AICD again noted. Left chest pacer/resuscitation pads remain in place. Stable cardiomegaly and mediastinal contours. Lung volumes and ventilation not significantly changed. No pneumothorax or pleural effusion identified on this supine view. Paucity of bowel gas in the right upper quadrant. IMPRESSION: 1. Endotracheal tube tip projects at the carina. Recommend retraction of 1 cm and repeat portable chest confirm. 2. Enteric tube placed to the abdomen, abdomen radiograph reported separately. 3. Stable cardiomegaly.  No acute pulmonary abnormality. Electronically Signed: By: VEAR Hurst M.D. On: 05/16/2024 06:22   DG Abd Portable 1V Result Date: 05/16/2024 CLINICAL DATA:  73 year old female with shortness of breath, possible cardiac shock. Intubated. EXAM: PORTABLE ABDOMEN - 1 VIEW COMPARISON:  Portable chest reported separately today. CT Abdomen and Pelvis 10/05/2018. FINDINGS: Portable AP supine view at 0534 hours. Enteric tube terminates in the left upper quadrant, side hole is visible at the gastric fundus level. Small volume of gas in the distal stomach. Nonobstructed visible bowel gas pattern. No acute osseous abnormality identified. IMPRESSION: Satisfactory enteric tube placement into the stomach. Electronically  Signed   By: VEAR Hurst M.D.   On: 05/16/2024 06:22   DG CHEST PORT 1 VIEW Result Date: 05/16/2024 CLINICAL DATA:  Status post central venous catheter placement EXAM: PORTABLE CHEST 1 VIEW COMPARISON:  05/15/2024 FINDINGS: Cardiac shadow is enlarged but stable. Defibrillator is again seen. New right jugular central line is noted with the tip in the mid right  atrium. No pneumothorax is seen. Remainder of the study is stable from the prior exam. IMPRESSION: No pneumothorax following central venous catheter placement. Electronically Signed   By: Oneil Devonshire M.D.   On: 05/16/2024 03:39   US  EKG SITE RITE Result Date: 05/15/2024 If Site Rite image not attached, placement could not be confirmed due to current cardiac rhythm.  DG Chest Port 1 View Result Date: 05/15/2024 CLINICAL DATA:  [name] EXAM: PORTABLE CHEST 1 VIEW COMPARISON:  X-ray 04/15/2024 and older.  CTA chest 04/10/2024. FINDINGS: Underinflation. Enlarged cardiopericardial silhouette with some vascular congestion and bronchial wall thickening. No pneumothorax or effusion. No separate consolidation. Left upper chest battery pack with defibrillator leads. Overlapping cardiac leads and defibrillator pads. Surgical clips in the right axillary region. Film is rotated. IMPRESSION: Underinflation with enlarged cardiac silhouette with vascular congestion. Defibrillator. Limited x-ray. Electronically Signed   By: Ranell Bring M.D.   On: 05/15/2024 19:43     Medications:     Current Medications:  allopurinol   300 mg Oral Daily   Chlorhexidine  Gluconate Cloth  6 each Topical Daily   cyanocobalamin   1,000 mcg Oral Daily   latanoprost   1 drop Both Eyes QHS   [START ON May 30, 2024] levothyroxine   25 mcg Per Tube Daily   loratadine   10 mg Oral QHS   montelukast   10 mg Oral QHS   mouth rinse  15 mL Mouth Rinse Q2H   pantoprazole  (PROTONIX ) IV  40 mg Intravenous Q24H   PARoxetine   30 mg Oral QHS   sodium chloride  flush  3 mL Intravenous Q12H     Infusions:  sodium chloride      amiodarone  30 mg/hr (05/16/24 0554)   epinephrine  5 mcg/min (05/16/24 0558)   fentaNYL  infusion INTRAVENOUS 50 mcg/hr (05/16/24 0553)   furosemide  (LASIX ) 200 mg in dextrose  5 % 100 mL (2 mg/mL) infusion 10 mg/hr (05/16/24 0624)   heparin  1,200 Units/hr (05/16/24 9376)   lidocaine  1 mg/min (05/16/24 0556)   lidocaine      midazolam  1 mg/hr (05/16/24 0525)   norepinephrine  (LEVOPHED ) Adult infusion 20 mcg/min (05/16/24 9392)   vasopressin  0.04 Units/min (05/16/24 9391)      Patient Profile   73 y/o female w/ h/o chronic systolic HF due to NICM, felt possibly chemo induced following herceptin therapy for treatment of breast cancer, VT and afib, s/p ICD, admitted w/ VT storm.   Assessment/Plan   1. VT Strom - Monomorphic VT, suspect 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 Required multiple defibs overnight + brief CPR - on amio + lidocaine  gtts  - K 5.1, Mg 2.7  - continues to have runs of VT this morning - will continue amio load and lidocaine  gtt - family considering making DNR and going comfort   2. Acute on Chronic Systolic Heart Failure w/ CS - NICM, last Echo 6/25 EF <20%, RV normal, mod functional MR  - BNP > 1500. Admit CXR w/ vascular congestion. Co-ox 48%  - Continue Lasix  gtt at 10/hr  - on multiple inotropic and pressor support, Epi 5, NE 20, VP 0.04. Will recheck afternoon Co-ox and trend lactate   - End-stage HF w/ MSOF. Not candidate for advanced therapies or MCS  - family considering comfort care   3. Acute Hypoxic Respiratory Failure - intubated  - vent management per CCM   4. AKI and Shock Liver - SCr 2.4 (b/l 1.5) - AST and ALT 1600  - 2/2 VT storm/CS   5. Atrial Fibrillation  - suspect triggered VT -  continue amio gtt  - continue heparin  gtt   CRITICAL CARE Performed by: Caffie Shed   Total critical care time: 15 minutes  Critical care time was exclusive of separately billable procedures and treating other  patients.  Critical care was necessary to treat or prevent imminent or life-threatening deterioration.  Critical care was time spent personally by me on the following activities: development of treatment plan with patient and/or surrogate as well as nursing, discussions with consultants, evaluation of patient's response to treatment, examination of patient, obtaining history from patient or surrogate, ordering and performing treatments and interventions, ordering and review of laboratory studies, ordering and review of radiographic studies, pulse oximetry and re-evaluation of patient's condition.   Length of Stay: 1  Marcia Lepera, PA-C  05/16/2024, 7:18 AM    Advanced Heart Failure Team Pager [phone] (M-F; 7a - 5p)  Please contact [facility] for night-coverage after hours (4p -7a ) and weekends on [email or web address]

## 2024-05-16 NOTE — Consult Note (Addendum)
 ELECTROPHYSIOLOGY CONSULT NOTE    Patient ID: [name] MRN: [id number], DOB/AGE: [birth year] 73 y.o.  Admit date: 05/15/2024 Date of Consult: 05/16/2024  Primary Physician: Thurmond Cathlyn LABOR., MD Primary Cardiologist: None  Electrophysiologist: Dr. Inocencio   Referring Provider: Dr. Zenaida   Patient Profile: [name] is a 73 y.o. female with a history of AF, NICM, VT, HFrEF s/p CRT-D, HTN, HLD, DVT, type b aortic dissection, breast CA s/p lumpectomy, XRT/chemo (cardiomyopathy thought secondary to chemo), severe OSA who is being seen today for the evaluation of VT at the request of Dr. Zenaida.  HPI:  [name] is a 73 y.o. female who presented to [facility] via EMS on 05/15/24 with reports of lethargy, nausea, dizziness.      She was admitted 6/16-6/20/25 for wide complex tachycardia that required ICD shock and external defibrillation. In review of EGM / telemetry, there was concern for possible tachy-induced-tachy. Previously, therapies delivered for [facility] caused the pt to develop VT, but with rates >240 bpm, can not rule out dual tachycardia.     The patient's device was reviewed and VT Zones were reprogrammed as below:  VT 1 - Monitor at 150 bpm  VT 2 - 184bpm, ATP, ATP, 36j, 40j x2 VF    - 222 bpm with ATP during charging, shock x5  She was seen 05/07/24 in the EP clinic post admit and had tapered down to 200 mg amiodarone  daily.  Her BP was low and metoprolol  was reduced. She had follow up in the Advanced HF Clinic the following day 7/9/ and her ivabradine  was stopped at that visit. She had nausea and shakiness on amiodarone  and was instructed to hold with follow up in the AF Clinic.  She was seen on 7/16 and felt well at that time.  Her blood pressure was again low and Entresto  was stopped.   She returned to the ER on 05/15/24 via EMS.  She was alert / oriented on arrival to ER.  She was seen in AF Clinic earlier in the day and felt fine during the visit. She then began to  develop shortness of breath.  EMS found her to have a WCT. Overnight she had VT storm & cardiogenic shock.  Pulseless VT required brief chest compressions and shock x4, she was intubated and placed on amiodarone  IV as well as lidocaine . Requiring multiple vasopressors > EPI, NE and vasopressin .   Labs Potassium6.8* (07/17 9092) Magnesium   2.7* (07/17 0320) Creatinine, ser  2.39* (07/17 0526) PLT  134* (07/17 0320) HGB  12.9 (07/17 0907) WBC 9.0 (07/17 0320) Troponin I (High Sensitivity)100* (07/16 2128).    Past Medical History:  Diagnosis Date   Aortic dissection (HCC) 12/2008   Type B   Arthritis    lower back   Atrial fibrillation (HCC)    post op (left thoracotomy) 11/12 req amio/DCCV   Breast cancer (HCC) 07/2009   Stage 3 lumpectomy, radiation, chemotherapy; finished chemo October, felt to be in remission   Cardiac defibrillator in place    St. Jude (JA) 9/12; LV lead placement unsuccessful;  s/p left thoracotomy with placement of epicardial LV lead 09/2011 (Dr. PVT)   CHF (congestive heart failure) (HCC)    Chronic systolic dysfunction of left ventricle    Depression    DVT (deep venous thrombosis) (HCC) 10/18/2010   GERD (gastroesophageal reflux disease)    Glaucoma    Hiatal hernia    small hiatal hernia   History of shingles  Hyperlipidemia    Hypertension    Left bundle branch block    Nonischemic cardiomyopathy (HCC)    Admission 12/11 with EF 25%, normal coronary arteries by cath 12/11; NICM presumed to be secondary to chemotherapy for breast cancer   Obesity    [name]    with exertion     Surgical History:  Past Surgical History:  Procedure Laterality Date   BIV ICD GENERATOR CHANGEOUT N/A 05/22/2023   Procedure: BIV ICD GENERATOR CHANGEOUT;  Surgeon: Inocencio Soyla Lunger, MD;  Location: [facility] INVASIVE CV LAB;  Service: Cardiovascular;  Laterality: N/A;   BREAST LUMPECTOMY     Right breast   CARDIAC CATHETERIZATION     2011   CARDIAC  DEFIBRILLATOR PLACEMENT     EP IMPLANTABLE DEVICE N/A 01/12/2016   STJ ICD Unify Assura gen change, Dr. Kelsie   OOPHORECTOMY     Single   THORACOTOMY  09/16/2011   Procedure: THORACOTOMY MAJOR;  Surgeon: Maude Salinas Trigt III, MD;  Location: [facility] OR;  Service: Thoracic;  Laterality: Left;  left anterolateral Thoracotomy for placement of St. Jude epicardial pacing lead    TONSILLECTOMY     TUBAL LIGATION     Bilateral     Medications Prior to Admission  Medication Sig Dispense Refill Last Dose/Taking   acetaminophen  (TYLENOL ) 500 MG tablet Take 1,000 mg by mouth every 6 (six) hours as needed for mild pain (pain score 1-3), moderate pain (pain score 4-6) or headache.   Past Week   allopurinol  (ZYLOPRIM ) 300 MG tablet Take 300 mg by mouth daily.   05/15/2024 Morning   amiodarone  (PACERONE ) 200 MG tablet Take 1 tablet by mouth daily   05/15/2024 Morning   apixaban  (ELIQUIS ) 5 MG TABS tablet Take 1 tablet (5 mg total) by mouth 2 (two) times daily. 60 tablet 3 05/15/2024 at 10:00 AM   Calcium  Carbonate-Vitamin D (CALCIUM -VITAMIN D3) 600-200 MG-UNIT TABS Take 1 tablet by mouth 2 (two) times daily.   05/15/2024 Morning   cetirizine (ZYRTEC) 10 MG tablet Take 10 mg by mouth at bedtime.   05/14/2024 Bedtime   Cholecalciferol (VITAMIN D) 125 MCG (5000 UT) CAPS Take 5,000 Units by mouth daily.   05/15/2024 Morning   colchicine  0.6 MG tablet Take 0.6 mg by mouth daily as needed (Gout flare up).   Unknown   cyanocobalamin  (VITAMIN B12) 1000 MCG tablet Take 1,000 mcg by mouth daily.   05/15/2024 Morning   cyclobenzaprine  (FLEXERIL ) 10 MG tablet Take 10 mg by mouth 3 (three) times daily as needed for muscle spasms.   Unknown   diclofenac  sodium (VOLTAREN ) 1 % GEL APPLY THREE GRAMS TO THREE LARGE JOINTS UP TO THREE TIMES DAILY AS NEEDED (Patient taking differently: Apply 3 g topically 3 (three) times daily as needed (for joints).) 3 Tube 3 05/15/2024 Morning   famotidine  (PEPCID ) 20 MG tablet Take 20 mg by mouth 2 (two)  times daily.   05/15/2024 Morning   furosemide  (LASIX ) 40 MG tablet Take 1 tablet (40 mg total) by mouth daily.   05/15/2024 Morning   levothyroxine  (SYNTHROID ) 25 MCG tablet Take 25 mcg by mouth daily.   05/15/2024 Morning   losartan  (COZAAR ) 25 MG tablet Take 0.5 tablets (12.5 mg total) by mouth at bedtime. 45 tablet 3 05/14/2024 Bedtime   LUMIGAN  0.01 % SOLN Place 1 drop into both eyes at bedtime.   05/14/2024 Bedtime   metoprolol  succinate (TOPROL -XL) 25 MG 24 hr tablet Take 0.5 tablets (12.5 mg total) by  mouth 2 (two) times daily.   05/15/2024 Morning   montelukast  (SINGULAIR ) 10 MG tablet Take 10 mg by mouth at bedtime. For allergies & congestion   05/14/2024 Bedtime   Multiple Vitamins-Minerals (OCUVITE PO) Take 5 mg by mouth daily.   05/15/2024 Morning   omeprazole  (PRILOSEC) 40 MG capsule TAKE ONE CAPSULE BY MOUTH TWICE DAILY 60 capsule 8 05/15/2024 Morning   ondansetron  (ZOFRAN ) 4 MG tablet Take 4 mg by mouth every 8 (eight) hours as needed for nausea.   05/15/2024 Noon   PARoxetine  (PAXIL ) 30 MG tablet Take 30 mg by mouth at bedtime.  1 05/14/2024 Bedtime   Polyethyl Glycol-Propyl Glycol (SYSTANE OP) Place 1 drop into both eyes daily as needed (dry eyes).   05/15/2024 Morning   ranitidine (ZANTAC) 300 MG capsule Take 300 mg by mouth daily as needed. Acid indigestion (Patient taking differently: Take 300 mg by mouth as needed. Acid indigestion)   Past Week   rosuvastatin  (CRESTOR ) 5 MG tablet TAKE ONE TABLET BY MOUTH DAILY AT 8 PM 90 tablet 3 05/14/2024 Evening   sodium fluoride  (DENTA 5000 PLUS) 1.1 % CREA dental cream Place 1 Application onto teeth 2 (two) times daily.   05/15/2024 Morning   spironolactone  (ALDACTONE ) 25 MG tablet Take 0.5 tablets (12.5 mg total) by mouth daily. 15 tablet 3 05/15/2024 Morning   traMADol  (ULTRAM ) 50 MG tablet Take 50 mg by mouth every 6 (six) hours as needed for pain. (Patient taking differently: Take 50 mg by mouth as needed for pain.)   Past Week   vitamin E 1000 UNIT  capsule Take 1,000 Units by mouth daily.   05/15/2024 Morning   nystatin  (MYCOSTATIN /NYSTOP ) powder Apply topically 2 (two) times daily. (Patient not taking: Reported on 05/15/2024) 30 g 0 Not Taking    Inpatient Medications:   allopurinol   300 mg Oral Daily   Chlorhexidine  Gluconate Cloth  6 each Topical Daily   cyanocobalamin   1,000 mcg Oral Daily   latanoprost   1 drop Both Eyes QHS   [START ON Jun 14, 2024] levothyroxine   25 mcg Per Tube Daily   loratadine   10 mg Oral QHS   montelukast   10 mg Oral QHS   mouth rinse  15 mL Mouth Rinse Q2H   pantoprazole  (PROTONIX ) IV  40 mg Intravenous Q24H   PARoxetine   30 mg Oral QHS   sodium chloride  flush  3 mL Intravenous Q12H   thiamine  (VITAMIN B1) injection  100 mg Intravenous Daily    Allergies:  Allergies  Allergen Reactions   Wound Dressing Adhesive Other (See Comments)    Blistering of the skin;    Alphagan [Brimonidine]     Irritation    Mobic [Meloxicam] Other (See Comments)    Cannot take due to heart issue   Nsaids Other (See Comments)    Cannot take due to heart issue   Polyvinyl Alcohol  Other (See Comments)    Travatan and Alphagan(irritation)   Statins Other (See Comments)    Per pt her cardiologist told her not to take   Tolmetin Other (See Comments)    Can not take due to heart issue   Travatan Z [Travoprost (Bak Free)]     Irritation   Tape Rash    tape Blister from clear tapes.    Family History  Problem Relation Age of Onset   Hypertension Mother    Glaucoma Mother    Atrial fibrillation Mother    Hypertension Father    Hypertension Brother    Hypertension  Maternal Grandfather    Heart attack Maternal Grandfather        MI   Coronary artery disease Maternal Grandfather    Hypertension Paternal Grandfather    Heart attack Paternal Grandfather        MI   Coronary artery disease Paternal Grandfather    Hypertension Brother      Physical Exam: Vitals:   05/16/24 0701 05/16/24 0748 05/16/24 0800 05/16/24  0900  BP:      Pulse: (!) 105 89 90 (!) 50  Resp: (!) 22 (!) 22 (!) 22 (!) 22  Temp:      TempSrc:      SpO2: 96% 97% 97% 99%  Weight:      Height:        GEN- critically ill appearing adult female on vent in NAD HEENT: Normocephalic, atraumatic Lungs- non-labored, vent assisted breath sounds Heart- Regular rate and rhythm, No M/G/R.  GI- Soft, NT, ND.  Extremities- No clubbing, cyanosis. Difficult to assess edema due to body habitus   Radiology/Studies: DG CHEST PORT 1 VIEW Addendum Date: 05/16/2024 ADDENDUM REPORT: 05/16/2024 06:36 ADDENDUM: Study discussed by telephone with Nurse Hadassah on 05/16/2024 at 0628 hours, and she advised the medical team had adjusted the ET tube back following this exam. Electronically Signed   By: VEAR Hurst M.D.   On: 05/16/2024 06:36   Result Date: 05/16/2024 CLINICAL DATA:  73 year old female with shortness of breath, possible cardiac shock. Intubated. EXAM: PORTABLE CHEST 1 VIEW COMPARISON:  Portable chest 0308 hours today. FINDINGS: Portable AP supine view at 0532 hours. Similarly rotated to the left. Endotracheal tube tip projects at the carina. Enteric tube courses to the left abdomen, tip not included. Right IJ central line placed, tip at the right atrium level, stable from earlier. Left chest AICD again noted. Left chest pacer/resuscitation pads remain in place. Stable cardiomegaly and mediastinal contours. Lung volumes and ventilation not significantly changed. No pneumothorax or pleural effusion identified on this supine view. Paucity of bowel gas in the right upper quadrant. IMPRESSION: 1. Endotracheal tube tip projects at the carina. Recommend retraction of 1 cm and repeat portable chest confirm. 2. Enteric tube placed to the abdomen, abdomen radiograph reported separately. 3. Stable cardiomegaly.  No acute pulmonary abnormality. Electronically Signed: By: VEAR Hurst M.D. On: 05/16/2024 06:22   DG Abd Portable 1V Result Date: 05/16/2024 CLINICAL DATA:   73 year old female with shortness of breath, possible cardiac shock. Intubated. EXAM: PORTABLE ABDOMEN - 1 VIEW COMPARISON:  Portable chest reported separately today. CT Abdomen and Pelvis 10/05/2018. FINDINGS: Portable AP supine view at 0534 hours. Enteric tube terminates in the left upper quadrant, side hole is visible at the gastric fundus level. Small volume of gas in the distal stomach. Nonobstructed visible bowel gas pattern. No acute osseous abnormality identified. IMPRESSION: Satisfactory enteric tube placement into the stomach. Electronically Signed   By: VEAR Hurst M.D.   On: 05/16/2024 06:22   DG CHEST PORT 1 VIEW Result Date: 05/16/2024 CLINICAL DATA:  Status post central venous catheter placement EXAM: PORTABLE CHEST 1 VIEW COMPARISON:  05/15/2024 FINDINGS: Cardiac shadow is enlarged but stable. Defibrillator is again seen. New right jugular central line is noted with the tip in the mid right atrium. No pneumothorax is seen. Remainder of the study is stable from the prior exam. IMPRESSION: No pneumothorax following central venous catheter placement. Electronically Signed   By: Oneil Devonshire M.D.   On: 05/16/2024 03:39   US  EKG SITE RITE  Result Date: 05/15/2024 If Site Rite image not attached, placement could not be confirmed due to current cardiac rhythm.  DG Chest Port 1 View Result Date: 05/15/2024 CLINICAL DATA:  [name] EXAM: PORTABLE CHEST 1 VIEW COMPARISON:  X-ray 04/15/2024 and older.  CTA chest 04/10/2024. FINDINGS: Underinflation. Enlarged cardiopericardial silhouette with some vascular congestion and bronchial wall thickening. No pneumothorax or effusion. No separate consolidation. Left upper chest battery pack with defibrillator leads. Overlapping cardiac leads and defibrillator pads. Surgical clips in the right axillary region. Film is rotated. IMPRESSION: Underinflation with enlarged cardiac silhouette with vascular congestion. Defibrillator. Limited x-ray. Electronically  Signed   By: Ranell Bring M.D.   On: 05/15/2024 19:43   CUP PACEART INCLINIC DEVICE CHECK Result Date: 05/07/2024 Normal in-clinic CRT-D (multi-lead) check. Presenting Rhythm: AS BiV. Routine testing was performed. Thresholds, sensing, impedance trend were stable and no changes were required. HF diagnostics are stable. No treated arrhythmias. Episodes of AF/AFL with  RVR (previously seen in hospital), AF burden 13%. Patient BiV pacing 95% of the time. Estimated longevity 4-4.3 years . Pt enrolled in remote follow-up. bo   EKG: (personally reviewed) 05/15/24 AS VP 60 bpm in AF Clinic 05/15/24 ER visit > WCT 128 bpm  TELEMETRY: VP 50's-70's, episodes of MMVT overnight, external shock x4 (personally reviewed)  DEVICE HISTORY:  Abbott BiV ICD implanted 07/15/11 for HFrEF, LV lead added 09/16/11  Generator Change > 05/22/23  History of appropriate therapy: Yes History of AAD therapy: Yes; currently on amiodarone     Device Interrogation 05/16/24  DDD 50 bpm  Presenting: APBiV Underlying: SB 40's Sensing, impedance, thresholds stable / at baseline for patient  EGM Review: shows AF/AFL with conduction to V at patients baseline V morphology, intermittent blocked beats at upper rate / pacemaker Wenckebach > then the morphology changes to VT at times under detection rate binned as NSVT, episodes lasted 4-10 seconds / below detection zone  Assessment/Plan:  Monomorphic Ventricular Tachycardia / VT Storm Post CPR  Chronic Systolic Dysfunction due to NICM s/p Abbott CRT-D  LBBB  -continue amiodarone  IV 60mg /hr -of note, was not tolerating oral amio due to nausea and shaking -vasopressors per PCCM / Advanced HF Team -goal K+>4, Mg+>2 -reviewed VT zones with Dr. Inocencio, Danarius Mcconathy leave at current programming  -continue lidocaine  infusion -volume status per AHF Team   Paroxysmal Atrial Fibrillation  CHA2DS2-VASc 4 -EGM's confirm that AF/AFL triggers VT on device  -would plan for AV node ablation if  patient recovers medically  -continue heparin  infusion per pharmacy   Acute Respiratory Failure due to VT  -per PCCM   AKI / Shock Liver  -per primary      For questions or updates, please contact [facility] Please consult [email or web address] for contact info under     Signed, Daphne Barrack, NP-C, AGACNP-BC [facility] - Electrophysiology  05/16/2024, 11:53 AM     I have seen and examined this patient with Daphne Barrack   Agree with above, note added to reflect my findings.  Patient with a past history as above.  She was previously admitted for a wide-complex tachycardia requiring ICD therapy in June.  She was having atrial fibrillation which would degenerate into VT.  At that time, she had adjustments to her VT zones.  She was started on amiodarone  which was titrated down.  She was seen in A-fib clinic yesterday.  She was not tolerating amiodarone  and thus amiodarone  was stopped.  She came to the emergency room overnight last  night and was alert and oriented on presentation to the emergency room.  She began to develop shortness of breath.  She was found to be in a wide-complex tachycardia.  She developed VT storm and worsening cardiogenic shock.  She required chest compressions and external defibrillation x 4 and was intubated.  She was started on amiodarone  and lidocaine  as well as multiple vasopressors.  GEN: No acute distress.   Neck: No JVD Cardiac: RRR, no murmurs, rubs, or gallops.  Respiratory: normal BS bases bilaterally. GI: Soft, nontender, non-distended  MS: No edema; No deformity. Neuro:  Nonfocal  Skin: warm and dry, device site well healed Psych: Normal affect    VT storm Paroxysmal atrial fibrillation  chronic systolic heart failure Respiratory failure  shock liver/acute renal failure  Patient is critically ill.  She has had multiple arrhythmias, most notably atrial fibrillation/flutter which degenerates into ventricular tachycardia.  She is  currently in cardiogenic shock and being managed by advanced heart failure and critical care.  Terrell Ostrand continue with amiodarone  and lidocaine  for now.  If she does recover, she would likely benefit from AV node ablation as it appears that her ventricular arrhythmia resulted from degeneration of atrial arrhythmias.  EP to follow along remotely.  If she does make a meaningful recovery, would be happy to see her back.   Mattthew Ziomek M. Angle Karel MD 05/16/2024 1:26 PM

## 2024-05-16 NOTE — Consult Note (Signed)
 NAME:  [name], MRN:  [id number], DOB:  [birth year], LOS: 1 ADMISSION DATE:  05/15/2024, CONSULTATION DATE:  7/17 REFERRING MD:  Dr. 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  She has a long history with electrophysiology and advanced heart failure.  Was recently started on amiodarone .  Entresto  was also stopped due to low blood pressures 7/9.  She presented most emergency department 7/17 with acute onset of shortness of breath.  Upon EMS arrival she was found to be in a wide-complex rhythm.  Upon arrival to the emergency department this was noted to be ventricular tachycardia but converted without intervention and she was asymptomatic.  She did not feel a shock from her ICD.  She was admitted to the ICU by cardiology with acute on chronic HFrEF and borderline cardiogenic shock.  Amio and lasix  infusions started. ICU course was uneventful until about 0500 when she went into VT and lost pulses. Brief CPR duration with ROSC, but went back into VT again and pulses prompting defibrillation out of VT. This cycle happed 4 total times and she was defibrillated x 4. Intubated for airway protection by anesthesia. Lidocaine  started. Levo and eventually epi drips started.  PCCM consulted.  Pertinent  Medical History   has a past medical history of Aortic dissection (HCC) (12/2008), Arthritis, Atrial fibrillation (HCC), Breast cancer (HCC) (07/2009), Cardiac defibrillator in place, CHF (congestive heart failure) (HCC), Chronic systolic dysfunction of left ventricle, Depression, DVT (deep venous thrombosis) (HCC) (10/18/2010), GERD (gastroesophageal reflux disease), Glaucoma, Hiatal hernia, History of shingles,  Hyperlipidemia, Hypertension, Left bundle branch block, Nonischemic cardiomyopathy (HCC), Obesity, and [name].   Significant Hospital Events: Including procedures, antibiotic start and stop dates in addition to other pertinent events   7/16 admit VT, spontaneously converted.  CHF and shock 7/17 VT prompting defib x 4. Shock worse. Intubated, Pressors.   Interim History / Subjective:    Objective    Blood pressure 110/88, pulse (!) 56, temperature 98 F (36.7 C), temperature source Oral, resp. rate 16, height 5' 1 (1.549 m), weight 120.1 kg, SpO2 92%. CVP:  [9 mmHg] 9 mmHg  Vent Mode: PRVC FiO2 (%):  [50 %] 50 % Set Rate:  [18 bmp] 18 bmp Vt Set:  [400 mL] 400 mL PEEP:  [5 cmH20] 5 cmH20 Plateau Pressure:  [22 cmH20] 22 cmH20   Intake/Output Summary (Last 24 hours) at 05/16/2024 0531 Last data filed at 05/16/2024 0400 Gross per 24 hour  Intake 551.85 ml  Output 250 ml  Net 301.85 ml   Filed Weights   05/15/24 2102 05/16/24 0015 05/16/24 0357  Weight: 120.1 kg 120.1 kg 120.1 kg    Examination: General: obese female on vent critically ill HENT: [place]/AT, PERRL, unable to determine JVD Lungs: Coarse crackles throughout.  Cardiovascular: Tachy IRIR Abdomen: Soft, non-tender, non-distended Extremities: No acute deformity Neuro: Sedated GU: Foley  Resolved problem list   Assessment and Plan   Cardiogenic shock: Acute on chronic HFrEF: NICM secondary to chemotherapy. LVEF 25% - Management per heart failure - NE, Epi for MAP 65 - Lasix  infusion - RHC? - Repeat Coox and lactic pending - Track I&O  Ventricular tachycardia cardiac arrest: defib x 4 with ACLS - ICD in place -  Amiodarone  infusion - lidocaine  infusion - per cardiology  Acute respiratory failure with hypoxia: secondary to pulmonary edema in the setting of above.  OSA on BiPAP - full vent support - retract ett 2 cm - ABG reviewed, settings adjusted - PRN ABG, CXR  Acute metabolic  encephalopathy secondary to shock: - Fentanyl  and versed  have been started. Continue for now. Will need to transition away from versed  once she settles out hemodynamically - RASS goal -1 to -2.  AKI Hyperkalemia Hyponatremia - Creatine had been trending down, will repeat - K normalized - Diuresing  Shock liver vs congestive hepatopathy - LFTs still down trending - Check ammonia  Vaginal bleeding mild - Continue heparin  for now - Continue to monitor - CBC pending  Hypothyroid: TSH unremarkable  - synthroid  changed to per tube.   Best Practice (right click and Reselect all SmartList Selections daily)   Diet/type: NPO DVT prophylaxis systemic heparin  Pressure ulcer(s): pressure ulcer assessment deferred  GI prophylaxis: PPI Lines: Central line and Arterial Line Foley:  Yes, and it is still needed Code Status:  full code Last date of multidisciplinary goals of care discussion [ ]   Labs   CBC: Recent Labs  Lab 05/15/24 1906 05/16/24 0320  WBC 8.8 9.0  HGB 12.2 11.7*  HCT 36.8 35.1*  MCV 90.9 91.2  PLT 116* 134*    Basic Metabolic Panel: Recent Labs  Lab 05/15/24 1906 05/15/24 2303 05/16/24 0320  NA 127* 128* 127*  K 5.6* 5.0 4.4  CL 89* 89* 88*  CO2 22 24 25   GLUCOSE 94 96 105*  BUN 56* 58* 56*  CREATININE 2.88* 2.69* 2.54*  CALCIUM  9.2 9.0 8.8*  MG 2.3  --  2.7*   GFR: Estimated Creatinine Clearance: 23.9 mL/min (A) (by C-G formula based on SCr of 2.54 mg/dL (H)). Recent Labs  Lab 05/15/24 1906 05/15/24 2128 05/15/24 2303 05/16/24 0320  WBC 8.8  --   --  9.0  LATICACIDVEN  --  1.6 2.0* 1.2    Liver Function Tests: Recent Labs  Lab 05/15/24 1906 05/16/24 0320  AST 2,203* 1,930*  ALT 1,906* 1,756*  ALKPHOS 139* 138*  BILITOT 1.8* 1.5*  PROT 6.6 6.3*  ALBUMIN 2.8* 2.8*   No results for input(s): LIPASE, AMYLASE in the last 168 hours. No results for input(s): AMMONIA in the last 168 hours.  ABG    Component Value Date/Time    PHART 7.366 09/17/2011 0429   PCO2ART 54.2 (H) 09/17/2011 0429   PO2ART 84.0 09/17/2011 0429   HCO3 31.0 (H) 09/17/2011 0429   TCO2 33 09/17/2011 0429   O2SAT 50.1 05/16/2024 0320     Coagulation Profile: No results for input(s): INR, PROTIME in the last 168 hours.  Cardiac Enzymes: No results for input(s): CKTOTAL, CKMB, CKMBINDEX, TROPONINI in the last 168 hours.  HbA1C: Hgb A1c MFr Bld  Date/Time Value Ref Range Status  07/26/2023 04:25 PM 6.0 (H) 4.8 - 5.6 % Final    Comment:    (NOTE) Pre diabetes:          5.7%-6.4%  Diabetes:              >6.4%  Glycemic control for   <7.0% adults with diabetes     CBG: Recent Labs  Lab 05/15/24 2251  GLUCAP 94    Review of Systems:   Patient is encephalopathic and/or intubated; therefore, history has been obtained from chart review.    Past Medical History:  She,  has a past  medical history of Aortic dissection (HCC) (12/2008), Arthritis, Atrial fibrillation (HCC), Breast cancer (HCC) (07/2009), Cardiac defibrillator in place, CHF (congestive heart failure) (HCC), Chronic systolic dysfunction of left ventricle, Depression, DVT (deep venous thrombosis) (HCC) (10/18/2010), GERD (gastroesophageal reflux disease), Glaucoma, Hiatal hernia, History of shingles, Hyperlipidemia, Hypertension, Left bundle branch block, Nonischemic cardiomyopathy (HCC), Obesity, and [name].   Surgical History:   Past Surgical History:  Procedure Laterality Date   BIV ICD GENERATOR CHANGEOUT N/A 05/22/2023   Procedure: BIV ICD GENERATOR CHANGEOUT;  Surgeon: Inocencio Soyla Lunger, MD;  Location: [facility] INVASIVE CV LAB;  Service: Cardiovascular;  Laterality: N/A;   BREAST LUMPECTOMY     Right breast   CARDIAC CATHETERIZATION     2011   CARDIAC DEFIBRILLATOR PLACEMENT     EP IMPLANTABLE DEVICE N/A 01/12/2016   STJ ICD Unify Assura gen change, Dr. Kelsie   OOPHORECTOMY     Single   THORACOTOMY  09/16/2011   Procedure: THORACOTOMY  MAJOR;  Surgeon: Maude Fleeta Hanford DOUGLAS, MD;  Location: [facility] OR;  Service: Thoracic;  Laterality: Left;  left anterolateral Thoracotomy for placement of St. Jude epicardial pacing lead    TONSILLECTOMY     TUBAL LIGATION     Bilateral     Social History:   reports that she has never smoked. She has never used smokeless tobacco. She reports that she does not drink alcohol  and does not use drugs.   Family History:  Her family history includes Atrial fibrillation in her mother; Coronary artery disease in her maternal grandfather and paternal grandfather; Glaucoma in her mother; Heart attack in her maternal grandfather and paternal grandfather; Hypertension in her brother, brother, father, maternal grandfather, mother, and paternal grandfather.   Allergies Allergies  Allergen Reactions   Wound Dressing Adhesive Other (See Comments)    Blistering of the skin;    Alphagan [Brimonidine]     Irritation    Mobic [Meloxicam] Other (See Comments)    Cannot take due to heart issue   Nsaids Other (See Comments)    Cannot take due to heart issue   Polyvinyl Alcohol  Other (See Comments)    Travatan and Alphagan(irritation)   Statins Other (See Comments)    Per pt her cardiologist told her not to take   Tolmetin Other (See Comments)    Can not take due to heart issue   Travatan Z [Travoprost (Bak Free)]     Irritation   Tape Rash    tape Blister from clear tapes.     Home Medications  Prior to Admission medications   Medication Sig Start Date End Date Taking? Authorizing Provider  acetaminophen  (TYLENOL ) 500 MG tablet Take 1,000 mg by mouth every 6 (six) hours as needed for mild pain (pain score 1-3), moderate pain (pain score 4-6) or headache.   Yes [provider]  allopurinol  (ZYLOPRIM ) 300 MG tablet Take 300 mg by mouth daily.   Yes [provider]  amiodarone  (PACERONE ) 200 MG tablet Take 1 tablet by mouth daily 05/15/24  Yes Terra Fairy PARAS, PA-C  apixaban  (ELIQUIS ) 5 MG  TABS tablet Take 1 tablet (5 mg total) by mouth 2 (two) times daily. 04/05/24  Yes Terra Fairy PARAS, PA-C  Calcium  Carbonate-Vitamin D (CALCIUM -VITAMIN D3) 600-200 MG-UNIT TABS Take 1 tablet by mouth 2 (two) times daily.   Yes [provider]  cetirizine (ZYRTEC) 10 MG tablet Take 10 mg by mouth at bedtime. 04/08/19  Yes [provider]  Cholecalciferol (VITAMIN D) 125  MCG (5000 UT) CAPS Take 5,000 Units by mouth daily.   Yes [provider]  colchicine  0.6 MG tablet Take 0.6 mg by mouth daily as needed (Gout flare up).   Yes [provider]  cyanocobalamin  (VITAMIN B12) 1000 MCG tablet Take 1,000 mcg by mouth daily.   Yes [provider]  cyclobenzaprine  (FLEXERIL ) 10 MG tablet Take 10 mg by mouth 3 (three) times daily as needed for muscle spasms.   Yes [provider]  diclofenac  sodium (VOLTAREN ) 1 % GEL APPLY THREE GRAMS TO THREE LARGE JOINTS UP TO THREE TIMES DAILY AS NEEDED Patient taking differently: Apply 3 g topically 3 (three) times daily as needed (for joints). 04/04/18  Yes Cheryl Waddell HERO, PA-C  famotidine  (PEPCID ) 20 MG tablet Take 20 mg by mouth 2 (two) times daily.   Yes [provider]  furosemide  (LASIX ) 40 MG tablet Take 1 tablet (40 mg total) by mouth daily. 05/01/24  Yes Clegg, Amy D, NP  levothyroxine  (SYNTHROID ) 25 MCG tablet Take 25 mcg by mouth daily. 01/15/20  Yes [provider]  losartan  (COZAAR ) 25 MG tablet Take 0.5 tablets (12.5 mg total) by mouth at bedtime. 05/08/24 08/06/24 Yes Milford, Harlene HERO, FNP  LUMIGAN  0.01 % SOLN Place 1 drop into both eyes at bedtime. 04/30/24  Yes [provider]  metoprolol  succinate (TOPROL -XL) 25 MG 24 hr tablet Take 0.5 tablets (12.5 mg total) by mouth 2 (two) times daily. 05/07/24  Yes Ollis, Brandi L, NP  montelukast  (SINGULAIR ) 10 MG tablet Take 10 mg by mouth at bedtime. For allergies & congestion   Yes [provider]  Multiple Vitamins-Minerals (OCUVITE PO)  Take 5 mg by mouth daily.   Yes [provider]  omeprazole  (PRILOSEC) 40 MG capsule TAKE ONE CAPSULE BY MOUTH TWICE DAILY 08/04/20  Yes Kozlow, Camellia PARAS, MD  ondansetron  (ZOFRAN ) 4 MG tablet Take 4 mg by mouth every 8 (eight) hours as needed for nausea. 05/01/24  Yes [provider]  PARoxetine  (PAXIL ) 30 MG tablet Take 30 mg by mouth at bedtime. 02/10/16  Yes [provider]  Polyethyl Glycol-Propyl Glycol (SYSTANE OP) Place 1 drop into both eyes daily as needed (dry eyes).   Yes [provider]  ranitidine (ZANTAC) 300 MG capsule Take 300 mg by mouth daily as needed. Acid indigestion Patient taking differently: Take 300 mg by mouth as needed. Acid indigestion 05/07/24 05/08/25 Yes Ollis, Brandi L, NP  rosuvastatin  (CRESTOR ) 5 MG tablet TAKE ONE TABLET BY MOUTH DAILY AT 8 PM 05/13/24  Yes Bensimhon, Toribio SAUNDERS, MD  sodium fluoride  (DENTA 5000 PLUS) 1.1 % CREA dental cream Place 1 Application onto teeth 2 (two) times daily.   Yes [provider]  spironolactone  (ALDACTONE ) 25 MG tablet Take 0.5 tablets (12.5 mg total) by mouth daily. 04/09/24  Yes Bensimhon, Toribio SAUNDERS, MD  traMADol  (ULTRAM ) 50 MG tablet Take 50 mg by mouth every 6 (six) hours as needed for pain. Patient taking differently: Take 50 mg by mouth as needed for pain.   Yes [provider]  vitamin E 1000 UNIT capsule Take 1,000 Units by mouth daily.   Yes [provider]  nystatin  (MYCOSTATIN /NYSTOP ) powder Apply topically 2 (two) times daily. Patient not taking: Reported on 05/15/2024 04/08/24   Trixie Nilda HERO, MD     Critical care time: 53 minutes     Deward Eastern, AGACNP-BC [facility]  See Amion for personal pager PCCM on call pager [phone])  [phone] until 7pm. Please call Elink 7p-7a. [phone]  05/16/2024 6:12 AM

## 2024-05-16 NOTE — Progress Notes (Signed)
 PHARMACY - ANTICOAGULATION CONSULT NOTE  Pharmacy Consult for heparin  initiation Indication: atrial fibrillation  Allergies  Allergen Reactions   Wound Dressing Adhesive Other (See Comments)    Blistering of the skin;    Alphagan [Brimonidine]     Irritation    Mobic [Meloxicam] Other (See Comments)    Cannot take due to heart issue   Nsaids Other (See Comments)    Cannot take due to heart issue   Polyvinyl Alcohol  Other (See Comments)    Travatan and Alphagan(irritation)   Statins Other (See Comments)    Per pt her cardiologist told her not to take   Tolmetin Other (See Comments)    Can not take due to heart issue   Travatan Z [Travoprost (Bak Free)]     Irritation   Tape Rash    tape Blister from clear tapes.    Patient Measurements: Height: 5' 1 (154.9 cm) Weight: 120.1 kg (264 lb 12.4 oz) IBW/kg (Calculated) : 47.8 HEPARIN  DW (KG): 77.9  Vital Signs: Temp: 97.6 F (36.4 C) (07/17 1200) Temp Source: Axillary (07/17 1200) BP: 97/77 (07/17 0606) Pulse Rate: 70 (07/17 1300)  Labs: Recent Labs    05/15/24 1906 05/15/24 2128 05/15/24 2303 05/16/24 0320 05/16/24 0526 05/16/24 0527 05/16/24 0532 05/16/24 0907 05/16/24 0930 05/16/24 1520  HGB 12.2  --   --  11.7*  --  12.9 12.6 12.9  --   --   HCT 36.8  --   --  35.1*  --  38.0 37.0 38.0  --   --   PLT 116*  --   --  134*  --   --   --   --   --   --   APTT  --   --   --   --   --   --   --   --   --  64*  HEPARINUNFRC  --   --   --   --   --   --   --   --  >1.10*  --   CREATININE 2.88*  --  2.69* 2.54* 2.39*  --   --   --   --   --   TROPONINIHS 102* 100*  --   --   --   --   --   --   --   --     Estimated Creatinine Clearance: 25.4 mL/min (A) (by C-G formula based on SCr of 2.39 mg/dL (H)).   Medical History: Past Medical History:  Diagnosis Date   Aortic dissection (HCC) 12/2008   Type B   Arthritis    lower back   Atrial fibrillation (HCC)    post op (left thoracotomy) 11/12 req amio/DCCV    Breast cancer (HCC) 07/2009   Stage 3 lumpectomy, radiation, chemotherapy; finished chemo October, felt to be in remission   Cardiac defibrillator in place    St. Jude (JA) 9/12; LV lead placement unsuccessful;  s/p left thoracotomy with placement of epicardial LV lead 09/2011 (Dr. PVT)   CHF (congestive heart failure) (HCC)    Chronic systolic dysfunction of left ventricle    Depression    DVT (deep venous thrombosis) (HCC) 10/18/2010   GERD (gastroesophageal reflux disease)    Glaucoma    Hiatal hernia    small hiatal hernia   History of shingles    Hyperlipidemia    Hypertension    Left bundle branch block    Nonischemic cardiomyopathy (HCC)  Admission 12/11 with EF 25%, normal coronary arteries by cath 12/11; NICM presumed to be secondary to chemotherapy for breast cancer   Obesity    [name]    with exertion    Medications:  Apixaban  5mg  BID PTA  Assessment: 66 YOF w/ hx AF presents to ED on home eliquis , to be switched to heparin  while inpatient d/t AKI and acute illness. Per pt, she last took her eliquis  at 1000 this AM. Will plan to start heparin  infusion when next dose of eliquis  would be due. HGB 12.2, PLT 116.   Follow up @ 1520: heparin  level > 1.1 not correlating with aPTT of 64 which is slightly subtherapeutic.  Goal of Therapy:  aPTT 66-102 seconds Monitor platelets by anticoagulation protocol: Yes   Plan:  Increase heparin  infusion to 1250 units/hr Check aPTT and anti-Xa in 8 hours Transition to antiXa once correlating Continue to monitor H&H and platelets F/u Eliquis  plan  Randall Call, PharmD, BCPS 05/16/24 4:01 PM

## 2024-05-16 NOTE — Progress Notes (Signed)
 No intervention needed from IV Team. Pt has CVC and PIV x 3.

## 2024-05-16 NOTE — Procedures (Signed)
 Central Venous Catheter Insertion Procedure Note  [name]  [id number]  [birth year]  Date:05/16/24  Time:3:27 AM   Provider Performing:[name]   Procedure: Insertion of Non-tunneled Central Venous [phone]) with US  guidance (23062)   Indication(s) Medication administration and Difficult access  Consent Risks of the procedure as well as the alternatives and risks of each were explained to the patient and/or caregiver.  Consent for the procedure was obtained and is signed in the bedside chart  Anesthesia Topical only with 1% lidocaine    Timeout Verified patient identification, verified procedure, site/side was marked, verified correct patient position, special equipment/implants available, medications/allergies/relevant history reviewed, required imaging and test results available.  Sterile Technique Maximal sterile technique including full sterile barrier drape, hand hygiene, sterile gown, sterile gloves, mask, hair covering, sterile ultrasound probe cover (if used).  Procedure Description Area of catheter insertion was cleaned with chlorhexidine  and draped in sterile fashion.  With real-time ultrasound guidance a central venous catheter was placed into the right internal jugular vein. Nonpulsatile blood flow and easy flushing noted in all ports.  The catheter was sutured in place and sterile dressing applied.  Complications/Tolerance None; patient tolerated the procedure well. Chest X-ray is ordered to verify placement for internal jugular or subclavian cannulation.   Chest x-ray is not ordered for femoral cannulation.  EBL Minimal  Specimen(s) None

## 2024-05-16 NOTE — Anesthesia Procedure Notes (Signed)
 Procedure Name: Intubation Date/Time: 05/16/2024 4:56 AM  Performed by: Domenik Trice T, CRNAPre-anesthesia Checklist: Patient identified, Emergency Drugs available, Suction available and Patient being monitored Patient Re-evaluated:Patient Re-evaluated prior to induction Oxygen Delivery Method: Ambu bag Preoxygenation: Pre-oxygenation with 100% oxygen Induction Type: IV induction Ventilation: Mask ventilation without difficulty Laryngoscope Size: Glidescope and 3 Grade View: Grade I Tube type: Oral Tube size: 7.5 mm Number of attempts: 1 Airway Equipment and Method: Stylet and Oral airway Placement Confirmation: ETT inserted through vocal cords under direct vision, positive ETCO2 and breath sounds checked- equal and bilateral Secured at: 24 cm Tube secured with: Tape Dental Injury: Teeth and Oropharynx as per pre-operative assessment

## 2024-05-17 DIAGNOSIS — I5023 Acute on chronic systolic (congestive) heart failure: Secondary | ICD-10-CM

## 2024-05-17 DIAGNOSIS — J9601 Acute respiratory failure with hypoxia: Secondary | ICD-10-CM | POA: Diagnosis not present

## 2024-05-17 DIAGNOSIS — N179 Acute kidney failure, unspecified: Secondary | ICD-10-CM | POA: Diagnosis not present

## 2024-05-17 DIAGNOSIS — E876 Hypokalemia: Secondary | ICD-10-CM

## 2024-05-17 DIAGNOSIS — J9602 Acute respiratory failure with hypercapnia: Secondary | ICD-10-CM

## 2024-05-17 DIAGNOSIS — I480 Paroxysmal atrial fibrillation: Secondary | ICD-10-CM

## 2024-05-17 LAB — GLUCOSE, CAPILLARY
Glucose-Capillary: 136 mg/dL — ABNORMAL HIGH (ref 70–99)
Glucose-Capillary: 135 mg/dL — ABNORMAL HIGH (ref 70–99)
Glucose-Capillary: 134 mg/dL — ABNORMAL HIGH (ref 70–99)
Glucose-Capillary: 132 mg/dL — ABNORMAL HIGH (ref 70–99)
Glucose-Capillary: 120 mg/dL — ABNORMAL HIGH (ref 70–99)
Glucose-Capillary: 82 mg/dL (ref 70–99)
Glucose-Capillary: 137 mg/dL — ABNORMAL HIGH (ref 70–99)
Glucose-Capillary: 51 mg/dL — ABNORMAL LOW (ref 70–99)

## 2024-05-17 LAB — COMPREHENSIVE METABOLIC PANEL WITH GFR
ALT: 1688 U/L — ABNORMAL HIGH (ref 0–44)
AST: 1409 U/L — ABNORMAL HIGH (ref 15–41)
Albumin: 2.5 g/dL — ABNORMAL LOW (ref 3.5–5.0)
Alkaline Phosphatase: 135 U/L — ABNORMAL HIGH (ref 38–126)
Anion gap: 12 (ref 5–15)
BUN: 43 mg/dL — ABNORMAL HIGH (ref 8–23)
CO2: 25 mmol/L (ref 22–32)
Calcium: 7.6 mg/dL — ABNORMAL LOW (ref 8.9–10.3)
Chloride: 87 mmol/L — ABNORMAL LOW (ref 98–111)
Creatinine, Ser: 1.81 mg/dL — ABNORMAL HIGH (ref 0.44–1.00)
GFR, Estimated: 29 mL/min — ABNORMAL LOW (ref >60)
Glucose, Bld: 125 mg/dL — ABNORMAL HIGH (ref 70–99)
Potassium: 3.3 mmol/L — ABNORMAL LOW (ref 3.5–5.1)
Sodium: 124 mmol/L — ABNORMAL LOW (ref 135–145)
Total Bilirubin: 1.3 mg/dL — ABNORMAL HIGH (ref 0.0–1.2)
Total Protein: 6 g/dL — ABNORMAL LOW (ref 6.5–8.1)

## 2024-05-17 LAB — COOXEMETRY PANEL
Carboxyhemoglobin: 0.6 % (ref 0.5–1.5)
Methemoglobin: <0.7 % (ref 0.0–1.5)
O2 Saturation: 60 %
Total hemoglobin: 12.3 g/dL (ref 12.0–16.0)

## 2024-05-17 LAB — CBC
HCT: 35.4 % — ABNORMAL LOW (ref 36.0–46.0)
Hemoglobin: 12.1 g/dL (ref 12.0–15.0)
MCH: 30.7 pg (ref 26.0–34.0)
MCHC: 34.2 g/dL (ref 30.0–36.0)
MCV: 89.8 fL (ref 80.0–100.0)
Platelets: 156 K/uL (ref 150–400)
RBC: 3.94 MIL/uL (ref 3.87–5.11)
RDW: 15 % (ref 11.5–15.5)
WBC: 9.3 K/uL (ref 4.0–10.5)
nRBC: 1.4 % — ABNORMAL HIGH (ref 0.0–0.2)

## 2024-05-17 LAB — APTT
aPTT: 73 s — ABNORMAL HIGH (ref 24–36)
aPTT: 78 s — ABNORMAL HIGH (ref 24–36)

## 2024-05-17 LAB — LIDOCAINE LEVEL: Lidocaine Lvl: 5.8 ug/mL — ABNORMAL HIGH (ref 1.5–5.0)

## 2024-05-17 LAB — HEPARIN LEVEL (UNFRACTIONATED): Heparin Unfractionated: >1.1 [IU]/mL — ABNORMAL HIGH (ref 0.30–0.70)

## 2024-05-17 LAB — PHOSPHORUS: Phosphorus: 3.3 mg/dL (ref 2.5–4.6)

## 2024-05-17 LAB — MAGNESIUM: Magnesium: 2 mg/dL (ref 1.7–2.4)

## 2024-05-17 LAB — PROTIME-INR
INR: 2.1 — ABNORMAL HIGH (ref 0.8–1.2)
Prothrombin Time: 24.6 s — ABNORMAL HIGH (ref 11.4–15.2)

## 2024-05-17 MED ORDER — PAROXETINE HCL 30 MG PO TABS: 30.0000 mg | ORAL_TABLET | Freq: Every day | ORAL | Status: DC

## 2024-05-17 MED ORDER — SODIUM CHLORIDE 0.9 % IV SOLN: INTRAVENOUS | Status: DC

## 2024-05-17 MED ORDER — GLYCOPYRROLATE 0.2 MG/ML IJ SOLN: 0.2000 mg | INTRAMUSCULAR | Status: DC | PRN

## 2024-05-17 MED ORDER — LIDOCAINE IN D5W 4-5 MG/ML-% IV SOLN
1.0000 mg/min | INTRAVENOUS | Status: DC
  Administered 2024-05-17: 1 mg/min via INTRAVENOUS

## 2024-05-17 MED ORDER — GLYCOPYRROLATE 0.2 MG/ML IJ SOLN
0.2000 mg | INTRAMUSCULAR | Status: DC | PRN
  Administered 2024-05-17: 0.2 mg via INTRAVENOUS
  Filled 2024-05-17: qty 1

## 2024-05-17 MED ORDER — LORATADINE 10 MG PO TABS: 10.0000 mg | ORAL_TABLET | Freq: Every day | ORAL | Status: DC

## 2024-05-17 MED ORDER — GLYCOPYRROLATE 1 MG PO TABS: 1.0000 mg | ORAL_TABLET | ORAL | Status: DC | PRN

## 2024-05-17 MED ORDER — ACETAMINOPHEN 325 MG PO TABS: 650.0000 mg | ORAL_TABLET | Freq: Four times a day (QID) | ORAL | Status: DC | PRN

## 2024-05-17 MED ORDER — PIPERACILLIN-TAZOBACTAM 3.375 G IVPB
3.3750 g | Freq: Three times a day (TID) | INTRAVENOUS | Status: DC
  Administered 2024-05-17: 3.375 g via INTRAVENOUS
  Filled 2024-05-17: qty 50

## 2024-05-17 MED ORDER — MONTELUKAST SODIUM 10 MG PO TABS: 10.0000 mg | ORAL_TABLET | Freq: Every day | ORAL | Status: DC

## 2024-05-17 MED ORDER — POTASSIUM CHLORIDE 20 MEQ PO PACK
40.0000 meq | PACK | Freq: Four times a day (QID) | ORAL | Status: AC
  Administered 2024-05-17 (×2): 40 meq
  Filled 2024-05-17 (×2): qty 2

## 2024-05-17 MED ORDER — ACETAMINOPHEN 650 MG RE SUPP: 650.0000 mg | Freq: Four times a day (QID) | RECTAL | Status: DC | PRN

## 2024-05-17 MED ORDER — POTASSIUM CHLORIDE CRYS ER 20 MEQ PO TBCR: 40.0000 meq | EXTENDED_RELEASE_TABLET | Freq: Four times a day (QID) | ORAL | Status: DC

## 2024-05-18 LAB — CULTURE, RESPIRATORY W GRAM STAIN

## 2024-05-22 ENCOUNTER — Encounter

## 2024-05-23 ENCOUNTER — Encounter: Admitting: Pulmonary Disease

## 2024-05-29 ENCOUNTER — Ambulatory Visit: Admitting: Pharmacist Clinician (PhC)/ Clinical Pharmacy Specialist

## 2024-05-31 NOTE — Discharge Summary (Signed)
  Advanced Heart Failure Death Summary  Death Summary   Patient ID: [name] MRN: [id number], DOB/AGE: [birth year] 73 y.o. Admit date: 05/15/2024 D/C date:     05/26/24   Primary Discharge Diagnoses:  Cardiogenic shock VT storm  Hospital Course: Patient with a longstanding history of chemotherapy induced cardiomyopathy. She has had multiple recent admissions for electrical instability, usually atrial fibrillation resulting in VT/VF. She presented after feeling poorly at home, and rapidly decompensated, developing VT storm and worsening cardiogenic shock. She was intubated after multiple defibrillations, placed on IV amiodarone  and lidocaine , and escalating doses of pressors. Was considered for MCS, but given her longstanding disease, worsening functional status, and obesity this was deferred. She initially defervesced on support, but her pressor requirement persisted and within 24 hours developed recurrent slow VT with resulting hypotension.   After discussion with her family, the decision was made to pursue comfort measures, noted that she was tired from fighting. Family and friends were gathered, she was given comfort care medications, palliatively extubated, and tachytherapies turned off. She passed peacefully surrounded by family and friends. Time of death 59.     Significant Diagnostic Studies None  Consultations  EP  Duration of Discharge Encounter: 45 minutes  Signed, Morene JINNY Brownie, MD 05-26-2024, 4:50 PM

## 2024-05-31 NOTE — Progress Notes (Signed)
 PHARMACY - ANTICOAGULATION CONSULT NOTE  Pharmacy Consult for heparin  Indication: atrial fibrillation  Allergies  Allergen Reactions   Wound Dressing Adhesive Other (See Comments)    Blistering of the skin;    Alphagan [Brimonidine]     Irritation    Mobic [Meloxicam] Other (See Comments)    Cannot take due to heart issue   Nsaids Other (See Comments)    Cannot take due to heart issue   Polyvinyl Alcohol  Other (See Comments)    Travatan and Alphagan(irritation)   Statins Other (See Comments)    Per pt her cardiologist told her not to take   Tolmetin Other (See Comments)    Can not take due to heart issue   Travatan Z [Travoprost (Bak Free)]     Irritation   Tape Rash    tape Blister from clear tapes.    Patient Measurements: Height: 5' 1 (154.9 cm) Weight: 120.1 kg (264 lb 12.4 oz) IBW/kg (Calculated) : 47.8 HEPARIN  DW (KG): 77.9  Vital Signs: Temp: 98.3 F (36.8 C) (07/17 2330) Temp Source: Axillary (07/17 2330) BP: 63/30 (07/17 1645) Pulse Rate: 72 2024/06/11 0000)  Labs: Recent Labs    05/15/24 1906 05/15/24 2128 05/15/24 2303 05/16/24 0320 05/16/24 0526 05/16/24 0527 05/16/24 0532 05/16/24 0907 05/16/24 0930 05/16/24 1520 05/16/24 1810 05/16/24 2043 06-11-2024 0000  HGB 12.2  --   --  11.7*  --  12.9 12.6 12.9  --   --   --   --   --   HCT 36.8  --   --  35.1*  --  38.0 37.0 38.0  --   --   --   --   --   PLT 116*  --   --  134*  --   --   --   --   --   --   --   --   --   APTT  --   --   --   --   --   --   --   --   --  64*  --   --  73*  HEPARINUNFRC  --   --   --   --   --   --   --   --  >1.10*  --   --   --  >1.10*  CREATININE 2.88*  --    < > 2.54* 2.39*  --   --   --   --   --  2.27* 2.08*  --   TROPONINIHS 102* 100*  --   --   --   --   --   --   --   --   --   --   --    < > = values in this interval not displayed.    Estimated Creatinine Clearance: 29.2 mL/min (A) (by C-G formula based on SCr of 2.08 mg/dL (H)).   Medical History: Past  Medical History:  Diagnosis Date   Aortic dissection (HCC) 12/2008   Type B   Arthritis    lower back   Atrial fibrillation (HCC)    post op (left thoracotomy) 11/12 req amio/DCCV   Breast cancer (HCC) 07/2009   Stage 3 lumpectomy, radiation, chemotherapy; finished chemo October, felt to be in remission   Cardiac defibrillator in place    St. Jude (JA) 9/12; LV lead placement unsuccessful;  s/p left thoracotomy with placement of epicardial LV lead 09/2011 (Dr. PVT)   CHF (congestive heart  failure) (HCC)    Chronic systolic dysfunction of left ventricle    Depression    DVT (deep venous thrombosis) (HCC) 10/18/2010   GERD (gastroesophageal reflux disease)    Glaucoma    Hiatal hernia    small hiatal hernia   History of shingles    Hyperlipidemia    Hypertension    Left bundle branch block    Nonischemic cardiomyopathy (HCC)    Admission 12/11 with EF 25%, normal coronary arteries by cath 12/11; NICM presumed to be secondary to chemotherapy for breast cancer   Obesity    [name]    with exertion    Medications:  Apixaban  5mg  BID PTA  Assessment: 68 YOF w/ hx AF presents to ED on home eliquis , to be switched to heparin  while inpatient d/t AKI and acute illness. Per pt, she last took her eliquis  at 1000 this AM. Will plan to start heparin  infusion when next dose of eliquis  would be due. HGB 12.2, PLT 116.   aPTT 73 sec (therapeutic) on infusion at 1250 units/hr. Heparin  level >1.1 (affected by Eliquis ). No bleeding noted.  Goal of Therapy:  aPTT 66-102 seconds Monitor platelets by anticoagulation protocol: Yes   Plan:  Continue heparin  infusion at 1250 units/hr Will f/u aPTT to confirm therapeutic in 6 hours  Vito Ralph, PharmD, BCPS Please see amion for complete clinical pharmacist phone list 2024/05/19 12:49 AM

## 2024-05-31 NOTE — Progress Notes (Signed)
 Advanced Heart Failure Progress Update  Lidocaine  level came back as 5.8. Lidocaine  stopped. Ventricular rate slowly has trended up to 130s, developed brief run of VT that was terminated. However, continues to flip between AF RVR and slow VT rhythm with poor perfusion and profound hypotension requiring increasing pressors. Dr. Zenaida discussed with husband, daughter, and friend at the bedside that it is not likely that she will be able to maintain a heart rhythm and blood pressure despite maximal antiarrythmic therapy. They expressed understanding and just want her to be comfortable. Will restart lidocaine  gtt and continue current medical therapy until family/friends are able to be with her, then family would like to transition to comfort care.   In the meantime, if she develops sustained VT, it was reccommended by Dr. Zenaida that CPR and defibrillation not be performed, as it would likely not change the outcome and cause harm. Family is agreeable. Code status changed. Will transition to full comfort care today pending family arrival.   [place] Downie, NP 06-13-24  Advanced Heart Failure Team Pager [phone] (M-F; 7a - 5p)

## 2024-05-31 NOTE — Procedures (Signed)
 Extubation Procedure Note  Patient Details:   Name: BREE HEINZELMAN DOB: Jan 09, 1951 MRN: [id number]   Airway Documentation:    Vent end date: 05-26-24 Vent end time: 1724   Evaluation  O2 sats: currently acceptable Complications: No apparent complications Patient did tolerate procedure well. Bilateral Breath Sounds: Clear   No  Patient extubated to comfort care per order  Ricard JULIANNA Eagles May 26, 2024, 5:24 PM

## 2024-05-31 NOTE — Death Summary Note (Incomplete)
 1738  PEA on monitor, no respirations or palpable pulse. TOD pronounced by MD Zenaida at bedside. Family at bedside.

## 2024-05-31 NOTE — Progress Notes (Signed)
 PHARMACY - ANTICOAGULATION CONSULT NOTE  Pharmacy Consult for heparin  Indication: atrial fibrillation  Allergies  Allergen Reactions   Wound Dressing Adhesive Other (See Comments)    Blistering of the skin;    Alphagan [Brimonidine]     Irritation    Mobic [Meloxicam] Other (See Comments)    Cannot take due to heart issue   Nsaids Other (See Comments)    Cannot take due to heart issue   Polyvinyl Alcohol  Other (See Comments)    Travatan and Alphagan(irritation)   Statins Other (See Comments)    Per pt her cardiologist told her not to take   Tolmetin Other (See Comments)    Can not take due to heart issue   Travatan Z [Travoprost (Bak Free)]     Irritation   Tape Rash    tape Blister from clear tapes.    Patient Measurements: Height: 5' 1 (154.9 cm) Weight: 120 kg (264 lb 8.8 oz) IBW/kg (Calculated) : 47.8 HEPARIN  DW (KG): 77.9  Vital Signs: Temp: 98.6 F (37 C) Jun 13, 2024 0400) Temp Source: Axillary 06/13/24 0400) Pulse Rate: 82 06/13/2024 0601)  Labs: Recent Labs    05/15/24 1906 05/15/24 2128 05/15/24 2303 05/16/24 0320 05/16/24 0526 05/16/24 0532 05/16/24 0907 05/16/24 0930 05/16/24 1520 05/16/24 1810 05/16/24 2043 06/13/2024 0000 Jun 13, 2024 0513  HGB 12.2  --   --  11.7*   < > 12.6 12.9  --   --   --   --   --  12.1  HCT 36.8  --   --  35.1*   < > 37.0 38.0  --   --   --   --   --  35.4*  PLT 116*  --   --  134*  --   --   --   --   --   --   --   --  156  APTT  --   --   --   --   --   --   --   --  64*  --   --  73* 78*  LABPROT  --   --   --   --   --   --   --   --   --   --   --   --  24.6*  INR  --   --   --   --   --   --   --   --   --   --   --   --  2.1*  HEPARINUNFRC  --   --   --   --   --   --   --  >1.10*  --   --   --  >1.10*  --   CREATININE 2.88*  --    < > 2.54*   < >  --   --   --   --  2.27* 2.08*  --  1.81*  TROPONINIHS 102* 100*  --   --   --   --   --   --   --   --   --   --   --    < > = values in this interval not displayed.     Estimated Creatinine Clearance: 33.5 mL/min (A) (by C-G formula based on SCr of 1.81 mg/dL (H)).   Medical History: Past Medical History:  Diagnosis Date   Aortic dissection (HCC) 12/2008   Type B   Arthritis  lower back   Atrial fibrillation (HCC)    post op (left thoracotomy) 11/12 req amio/DCCV   Breast cancer (HCC) 07/2009   Stage 3 lumpectomy, radiation, chemotherapy; finished chemo October, felt to be in remission   Cardiac defibrillator in place    St. Jude (JA) 9/12; LV lead placement unsuccessful;  s/p left thoracotomy with placement of epicardial LV lead 09/2011 (Dr. PVT)   CHF (congestive heart failure) (HCC)    Chronic systolic dysfunction of left ventricle    Depression    DVT (deep venous thrombosis) (HCC) 10/18/2010   GERD (gastroesophageal reflux disease)    Glaucoma    Hiatal hernia    small hiatal hernia   History of shingles    Hyperlipidemia    Hypertension    Left bundle branch block    Nonischemic cardiomyopathy (HCC)    Admission 12/11 with EF 25%, normal coronary arteries by cath 12/11; NICM presumed to be secondary to chemotherapy for breast cancer   Obesity    [name]    with exertion    Medications:  Apixaban  5mg  BID PTA  Assessment: 73 YOF w/ hx AF presents to ED on home eliquis , to be switched to heparin  while inpatient d/t AKI and acute illness. Per pt, she last took her eliquis  at 1000 this AM. Will plan to start heparin  infusion when next dose of eliquis  would be due. HGB 12.2, PLT 116.   02-Jun-2024: confirmatory aPTT 78 sec, therapeutic on heparin  1250 units/hr. No issues with infusion running or signs of bleeding noted. CBC stable (Hgb 12.1, PLT 156).   Goal of Therapy:  aPTT 66-102 seconds Monitor platelets by anticoagulation protocol: Yes   Plan:  Continue heparin  infusion at 1250 units/hr Monitor aPTT and heparin  level daily until correlating Monitor CBC and s/sx of bleeding daily  Morna Breach, PharmD PGY2  Cardiology Pharmacy Resident 06-02-2024 7:01 AM

## 2024-05-31 NOTE — Consult Note (Signed)
 NAME:  [name], MRN:  [id number], DOB:  [birth year], LOS: 2 ADMISSION DATE:  05/15/2024, CONSULTATION DATE:  7/17 REFERRING MD:  Dr. 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  She has a long history with electrophysiology and advanced heart failure.  Was recently started on amiodarone .  Entresto  was also stopped due to low blood pressures 7/9.  She presented most emergency department 7/17 with acute onset of shortness of breath.  Upon EMS arrival she was found to be in a wide-complex rhythm.  Upon arrival to the emergency department this was noted to be ventricular tachycardia but converted without intervention and she was asymptomatic.  She did not feel a shock from her ICD.  She was admitted to the ICU by cardiology with acute on chronic HFrEF and borderline cardiogenic shock.  Amio and lasix  infusions started. ICU course was uneventful until about 0500 when she went into VT and lost pulses. Brief CPR duration with ROSC, but went back into VT again and pulses prompting defibrillation out of VT. This cycle happed 4 total times and she was defibrillated x 4. Intubated for airway protection by anesthesia. Lidocaine  started. Levo and eventually epi drips started.  PCCM consulted.  Pertinent  Medical History   has a past medical history of Aortic dissection (HCC) (12/2008), Arthritis, Atrial fibrillation (HCC), Breast cancer (HCC) (07/2009), Cardiac defibrillator in place, CHF (congestive heart failure) (HCC), Chronic systolic dysfunction of left ventricle, Depression, DVT (deep venous thrombosis) (HCC) (10/18/2010), GERD (gastroesophageal reflux disease), Glaucoma, Hiatal hernia, History of shingles,  Hyperlipidemia, Hypertension, Left bundle branch block, Nonischemic cardiomyopathy (HCC), Obesity, and [name].   Significant Hospital Events: Including procedures, antibiotic start and stop dates in addition to other pertinent events   7/16 admit VT, spontaneously converted.  CHF and shock 7/17 VT prompting defib x 4. Shock worse. Intubated, Pressors.   Interim History / Subjective:  Patient remains on vasopressin  and Levophed  Still in A-fib with RVR  Objective    Blood pressure (!) 108/58, pulse 92, temperature 99 F (37.2 C), temperature source Axillary, resp. rate (!) 22, height 5' 1 (1.549 m), weight 120 kg, SpO2 93%. CVP:  [12 mmHg-20 mmHg] 12 mmHg  Vent Mode: PRVC FiO2 (%):  [40 %-60 %] 40 % Set Rate:  [22 bmp] 22 bmp Vt Set:  [400 mL] 400 mL PEEP:  [8 cmH20] 8 cmH20 Plateau Pressure:  [21 cmH20-22 cmH20] 21 cmH20   Intake/Output Summary (Last 24 hours) at 10-Jun-2024 1053 Last data filed at 2024-06-10 0900 Gross per 24 hour  Intake 2737.38 ml  Output 3340 ml  Net -602.62 ml   Filed Weights   05/16/24 0015 05/16/24 0357 06/10/24 0235  Weight: 120.1 kg 120.1 kg 120 kg    Examination: General: Crtitically ill-appearing morbidly obese female, orally intubated HEENT: [place]/AT, eyes anicteric.  ETT and cortrak in place Neuro: Sedated, not following commands.  Eyes are closed.  Pupils 3 mm bilateral reactive to light Chest: Bilateral crackles, no wheezes or rhonchi Heart: Irregularly irregular, tachycardic, no murmurs or gallops Abdomen: Soft, nondistended, bowel sounds present  Labs and images reviewed  Patient Lines/Drains/Airways Status     Active Line/Drains/Airways     Name Placement date Placement time Site Days  Arterial Line 05/16/24 Left Brachial 05/16/24  1851  Brachial  1   Peripheral IV 05/15/24 20 G 1.16 Left Antecubital 05/15/24  1915  Antecubital  2   Peripheral IV 05/16/24 20 G 2.5 Anterior;Left Forearm 05/16/24  0152  Forearm  1   CVC  Triple Lumen 05/16/24 Right Internal jugular 05/16/24  0300  -- 1   NG/OG Vented/Dual Lumen Oral 60 cm 05/16/24  0500  Oral  1   Urethral Catheter Earnie, RN 05/16/24  0530  --  1   Airway 7.5 mm 05/16/24  0500  -- 1         Resolved problem list   Assessment and Plan  Acute on chronic HFrEF status post AICD with cardiogenic shock Nonischemic cardiomyopathy Paroxysmal A-fib with RVR Echo showed LVEF less than 20% with normal RV Continue Levophed  and vasopressin  Advanced heart failure team is following Continue Lasix  infusion at 10 mg/h Coox is improving, currently at 60% Monitor intake and output GDMT as tolerated, currently on hold due to shock state Continue IV heparin  infusion  VT storm with cardiac arrest status post defibrillation x 4 EP cardiology is following Continue amiodarone  and lidocaine  Closely monitor and electrolytes supplements Continue telemetry monitoring Patient may need AV nodal ablation  Acute respiratory failure with hypoxia and hypercapnia: secondary to pulmonary edema and aspiration pneumonia OSA on BiPAP Continue lung protective ventilation VAP prevention bundle placed PAD protocol with fentanyl  and propofol with RASS goal -2 Trend ABGs Send respiratory culture Continue diuresis per advanced heart failure team Started on antibiotics with Zosyn  AKI due to cardiorenal syndrome Hyperkalemia, now hypokalemia Hypervolemic hyponatremia Serum creatinine continue to improve Monitor intake and output Avoid nephrotoxic agent Continue electrolyte monitoring and supplementation  Shock liver LFTs started trending down Avoid hepatotoxic agent  Vaginal bleeding mild Continue IV heparin  infusion  Hypothyroid: TSH unremarkable  Continue Synthroid   Best Practice (right click and Reselect all SmartList Selections daily)   Diet/type: NPO start tube feeds DVT prophylaxis systemic heparin  Pressure ulcer(s): Please refer to nursing notes  moderately GI prophylaxis: PPI Lines: Central line and Arterial Line Foley:  Yes, and it is still needed Code Status:  full code Last date of multidisciplinary goals of care discussion [Per primary team]  Labs   CBC: Recent Labs  Lab 05/15/24 1906 05/16/24 0320 05/16/24 0527 05/16/24 0532 05/16/24 0907 06/09/2024 0513  WBC 8.8 9.0  --   --   --  9.3  HGB 12.2 11.7* 12.9 12.6 12.9 12.1  HCT 36.8 35.1* 38.0 37.0 38.0 35.4*  MCV 90.9 91.2  --   --   --  89.8  PLT 116* 134*  --   --   --  156    Basic Metabolic Panel: Recent Labs  Lab 05/15/24 1906 05/15/24 2303 05/16/24 0320 05/16/24 0526 05/16/24 0527 05/16/24 0532 05/16/24 0907 05/16/24 0930 05/16/24 1810 05/16/24 2043 2024-06-09 0513  NA 127*   < > 127* 127*   < > 124* 122*  --  126* 125* 124*  K 5.6*   < > 4.4 4.2   < > 5.1 6.8* 4.3 3.8 3.7 3.3*  CL 89*   < > 88* 90*  --   --   --   --  89* 88* 87*  CO2 22   < > 25 25  --   --   --   --  23 23 25   GLUCOSE 94   < > 105* 148*  --   --   --   --  136* 139* 125*  BUN 56*   < > 56* 53*  --   --   --   --  52* 51* 43*  CREATININE 2.88*   < > 2.54* 2.39*  --   --   --   --  2.27* 2.08* 1.81*  CALCIUM  9.2   < > 8.8* 8.1*  --   --   --   --  7.9* 7.9* 7.6*  MG 2.3  --  2.7*  --   --   --   --   --   --  2.3 2.0  PHOS  --   --   --  5.2*  --   --   --   --  4.8*  --  3.3   < > = values in this interval not displayed.   GFR: Estimated Creatinine Clearance: 33.5 mL/min (A) (by C-G formula based on SCr of 1.81 mg/dL (H)). Recent Labs  Lab 05/15/24 1906 05/15/24 2128 05/15/24 2303 05/16/24 0320 05/16/24 0520 05-24-2024 0513  WBC 8.8  --   --  9.0  --  9.3  LATICACIDVEN  --  1.6 2.0* 1.2 2.4*  --     Liver Function Tests: Recent Labs  Lab 05/15/24 1906 05/16/24 0320 05/16/24 0526 05/16/24 1810 05/24/2024 0513  AST 2,203* 1,930* 1,634* 1,822* 1,409*  ALT 1,906* 1,756* 1,584* 1,910* 1,688*  ALKPHOS 139* 138* 133* 152* 135*  BILITOT 1.8* 1.5* 1.2 1.4* 1.3*  PROT 6.6  6.3* 6.0* 6.3* 6.0*  ALBUMIN 2.8* 2.8* 2.6* 2.6* 2.5*   No results for input(s): LIPASE, AMYLASE in the last 168 hours. No results for input(s): AMMONIA in the last 168 hours.  ABG    Component Value Date/Time   PHART 7.376 05/16/2024 0907   PCO2ART 46.0 05/16/2024 0907   PO2ART 96 05/16/2024 0907   HCO3 27.3 05/16/2024 0907   TCO2 29 05/16/2024 0907   O2SAT 60 2024/05/24 0513     Coagulation Profile: Recent Labs  Lab May 24, 2024 0513  INR 2.1*    Cardiac Enzymes: No results for input(s): CKTOTAL, CKMB, CKMBINDEX, TROPONINI in the last 168 hours.  HbA1C: Hgb A1c MFr Bld  Date/Time Value Ref Range Status  07/26/2023 04:25 PM 6.0 (H) 4.8 - 5.6 % Final    Comment:    (NOTE) Pre diabetes:          5.7%-6.4%  Diabetes:              >6.4%  Glycemic control for   <7.0% adults with diabetes     CBG: Recent Labs  Lab 05/16/24 1618 05/16/24 1944 05/16/24 2325 05/24/2024 0400 05-24-24 0747  GLUCAP 136* 135* 134* 132* 120*    The patient is critically ill due to acute respiratory failure with hypoxia and hypercapnia/cardiogenic shock.  Critical care was necessary to treat or prevent imminent or life-threatening deterioration.  Critical care was time spent personally by me on the following activities: development of treatment plan with patient and/or surrogate as well as nursing, discussions with consultants, evaluation of patient's response to treatment, examination of patient, obtaining history from patient or surrogate, ordering and performing treatments and interventions, ordering and review of laboratory studies, ordering and review of radiographic studies, pulse oximetry, re-evaluation of patient's condition and participation in multidisciplinary rounds.   During this encounter critical care time was devoted to patient care services described in this note for 44 minutes.     Valinda Novas, MD [facility] See Amion for pager  If no  response to pager, please call [phone] until 7pm After 7pm, Please call E-link [phone]

## 2024-05-31 NOTE — Progress Notes (Signed)
 Paged for K of 3.3, known to have issues of VT throughout this admission. Also on lasix  drip. Ordering potassium 40meq x 2 doses. Reassess tomorrow ongoing supplementation needs.

## 2024-05-31 NOTE — Progress Notes (Signed)
 Advanced Heart Failure Rounding Note  Cardiologist: None  Chief Complaint: VT Storm Subjective:    7/17: Admitted with VT storm and cardiogenic shock with multisystem organ dysfunction  No further VT overnight, remains in atrial fibrillation with rates in the 100s, wide complex rhythm  Significant pressor requirement, but end organ function and urine output improving   Objective:    Weight Range: 120 kg Body mass index is 49.99 kg/m.   Vital Signs:   Temp:  [96.4 F (35.8 C)-98.6 F (37 C)] 98.6 F (37 C) Jun 12, 2024 0400) Pulse Rate:  [48-93] 77 2024/06/12 0730) Resp:  [18-23] 22 06/12/2024 0730) BP: (63-87)/(30-53) 63/30 (07/17 1645) SpO2:  [92 %-100 %] 94 % 2024/06/12 0730) Arterial Line BP: (76-135)/(46-80) 106/59 June 12, 2024 0730) FiO2 (%):  [50 %-60 %] 50 % 06/12/2024 0400) Weight:  [120 kg] 120 kg 06/12/2024 0235) Last BM Date :  (pta)  Weight change: Filed Weights   05/16/24 0015 05/16/24 0357 06-12-24 0235  Weight: 120.1 kg 120.1 kg 120 kg   Intake/Output:  Intake/Output Summary (Last 24 hours) at Jun 12, 2024 0744 Last data filed at 06/12/24 0600 Gross per 24 hour  Intake 3487.22 ml  Output 3230 ml  Net 257.22 ml    Physical Exam    GENERAL: Intubated, obese, chronically ill appearing PULM:  ventilated breath sounds CARDIAC:  JVP: moderately elevated        Irregular rate and rhythm.  1+ edema. Systolic murmur best heard at the apex ABDOMEN: Soft, non-tender, non-distended. NEUROLOGIC: Sedated    Telemetry   Atrial fibrillation with rates in the 100s, no further VT, occasional A-V BiV paced rhythm  Labs    CBC Recent Labs    05/16/24 0320 05/16/24 0527 05/16/24 0907 2024/06/12 0513  WBC 9.0  --   --  9.3  HGB 11.7*   < > 12.9 12.1  HCT 35.1*   < > 38.0 35.4*  MCV 91.2  --   --  89.8  PLT 134*  --   --  156   < > = values in this interval not displayed.   Basic Metabolic Panel Recent Labs    92/82/74 1810 05/16/24 2043 06-12-24 0513  NA 126* 125* 124*   K 3.8 3.7 3.3*  CL 89* 88* 87*  CO2 23 23 25   GLUCOSE 136* 139* 125*  BUN 52* 51* 43*  CREATININE 2.27* 2.08* 1.81*  CALCIUM  7.9* 7.9* 7.6*  MG  --  2.3 2.0  PHOS 4.8*  --  3.3   Liver Function Tests Recent Labs    05/16/24 1810 06-12-2024 0513  AST 1,822* 1,409*  ALT 1,910* 1,688*  ALKPHOS 152* 135*  BILITOT 1.4* 1.3*  PROT 6.3* 6.0*  ALBUMIN 2.6* 2.5*   Medications:     Scheduled Medications:  allopurinol   300 mg Oral Daily   Chlorhexidine  Gluconate Cloth  6 each Topical Daily   cyanocobalamin   1,000 mcg Oral Daily   latanoprost   1 drop Both Eyes QHS   levothyroxine   25 mcg Per Tube Daily   loratadine   10 mg Oral QHS   montelukast   10 mg Oral QHS   mouth rinse  15 mL Mouth Rinse Q2H   pantoprazole  (PROTONIX ) IV  40 mg Intravenous Q24H   PARoxetine   30 mg Oral QHS   potassium chloride   40 mEq Per Tube Q6H   sodium chloride  flush  3 mL Intravenous Q12H   thiamine  (VITAMIN B1) injection  100 mg Intravenous Daily    Infusions:  amiodarone  60 mg/hr (05-Jun-2024 0644)   epinephrine  Stopped (05/16/24 0849)   fentaNYL  infusion INTRAVENOUS 100 mcg/hr (05-Jun-2024 0600)   furosemide  (LASIX ) 200 mg in dextrose  5 % 100 mL (2 mg/mL) infusion 10 mg/hr (06-05-24 0600)   heparin  1,250 Units/hr (2024-06-05 0600)   lidocaine  2 mg/min (2024/06/05 0600)   midazolam  3 mg/hr (2024/06/05 0600)   norepinephrine  (LEVOPHED ) Adult infusion 17 mcg/min (02/25/24 0600)   vasopressin  0.04 Units/min (06-05-2024 0600)    PRN Medications: acetaminophen , artificial tears, fentaNYL , mouth rinse, phenylephrine , sodium chloride  flush    Patient Profile   73 y/o female w/ h/o chronic systolic HF due to NICM, felt possibly chemo induced following herceptin therapy for treatment of breast cancer, VT and afib, s/p ICD, admitted w/ VT storm and cardiogenic shock  Assessment/Plan   VT Strom - Monomorphic VT, 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 Required multiple defibs on admission + brief CPR - Lidocaine  level pending,  will decrease to 1/hour today, off this afternoon if stable - Continue IV amiodarone  at 60mg /hour for now - Replete K/Mg aggressively, K this morning - EP saw, would consider AV node ablation if she were to recover from her shock state   Acute on Chronic Systolic Heart Failure w/ CS - NICM, last Echo 6/25 EF <20% with severe dilation, RV normal, mod functional MR  - BNP > 1500. Admit CXR w/ vascular congestion. Coox now improved to 60 - Suspect induced by cardiac stunning in the setting of frequent defibrillations - Continue Lasix  gtt at 10/hr, urine output increasing, CVP still elevated at 16 - Still with persistent vasopressors needs, NE at 17, vaso, off epi - End-stage HF w/ MSOF. Not candidate for advanced therapies or MCS  - If unable to wean pressors or recurrent VT would consider transition to comfort care   Acute Hypoxic Respiratory Failure - intubated  - vent management per CCM    AKI and Shock Liver - due to hypoperfusion and shock - Creatinine improving with diuresis - Liver enzymes slowly trending down   Atrial Fibrillation  - suspect triggered VT - continue amio gtt  - continue heparin  gtt - Given that she has converted back and for multiple times likely limited role for DCCV     Length of Stay: 2   CRITICAL CARE Performed by: Morene JINNY Brownie   Total critical care time: 40 minutes  Critical care time was exclusive of separately billable procedures and treating other patients.  Critical care was necessary to treat or prevent imminent or life-threatening deterioration.  Critical care was time spent personally by me on the following activities: development of treatment plan with patient and/or surrogate as well as nursing, discussions with consultants, evaluation of patient's response to treatment, examination of patient, obtaining history from patient or surrogate, ordering and performing treatments and interventions, ordering and review of laboratory studies,  ordering and review of radiographic studies, pulse oximetry and re-evaluation of patient's condition.

## 2024-05-31 DEATH — deceased

## 2024-06-10 ENCOUNTER — Encounter: Payer: Self-pay | Admitting: Cardiology

## 2024-07-22 ENCOUNTER — Encounter (HOSPITAL_COMMUNITY)

## 2024-10-07 ENCOUNTER — Ambulatory Visit (HOSPITAL_COMMUNITY): Admitting: Internal Medicine
